# Patient Record
Sex: Male | Born: 1940
Health system: Southern US, Community
[De-identification: ages and names within clinical notes are randomized; demographics above are authoritative.]

## PROBLEM LIST (undated history)

## (undated) DIAGNOSIS — F17209 Nicotine dependence, unspecified, with unspecified nicotine-induced disorders: Secondary | ICD-10-CM

## (undated) DIAGNOSIS — I2510 Atherosclerotic heart disease of native coronary artery without angina pectoris: Secondary | ICD-10-CM

## (undated) DIAGNOSIS — I213 ST elevation (STEMI) myocardial infarction of unspecified site: Secondary | ICD-10-CM

## (undated) DIAGNOSIS — K5790 Diverticulosis of intestine, part unspecified, without perforation or abscess without bleeding: Secondary | ICD-10-CM

## (undated) DIAGNOSIS — R0989 Other specified symptoms and signs involving the circulatory and respiratory systems: Secondary | ICD-10-CM

## (undated) DIAGNOSIS — M1990 Unspecified osteoarthritis, unspecified site: Secondary | ICD-10-CM

## (undated) DIAGNOSIS — N182 Chronic kidney disease, stage 2 (mild): Secondary | ICD-10-CM

## (undated) DIAGNOSIS — I739 Peripheral vascular disease, unspecified: Secondary | ICD-10-CM

## (undated) DIAGNOSIS — E785 Hyperlipidemia, unspecified: Secondary | ICD-10-CM

## (undated) DIAGNOSIS — Z8546 Personal history of malignant neoplasm of prostate: Secondary | ICD-10-CM

## (undated) DIAGNOSIS — I480 Paroxysmal atrial fibrillation: Secondary | ICD-10-CM

## (undated) DIAGNOSIS — J449 Chronic obstructive pulmonary disease, unspecified: Secondary | ICD-10-CM

## (undated) DIAGNOSIS — K439 Ventral hernia without obstruction or gangrene: Secondary | ICD-10-CM

## (undated) DIAGNOSIS — I779 Disorder of arteries and arterioles, unspecified: Secondary | ICD-10-CM

## (undated) DIAGNOSIS — K627 Radiation proctitis: Secondary | ICD-10-CM

## (undated) DIAGNOSIS — G629 Polyneuropathy, unspecified: Secondary | ICD-10-CM

## (undated) DIAGNOSIS — I5042 Chronic combined systolic (congestive) and diastolic (congestive) heart failure: Secondary | ICD-10-CM

## (undated) DIAGNOSIS — I4892 Unspecified atrial flutter: Secondary | ICD-10-CM

## (undated) DIAGNOSIS — Z8673 Personal history of transient ischemic attack (TIA), and cerebral infarction without residual deficits: Secondary | ICD-10-CM

## (undated) HISTORY — DX: Atherosclerotic heart disease of native coronary artery without angina pectoris: I25.10

## (undated) HISTORY — PX: LUMBAR LAMINECTOMY: SHX95

## (undated) HISTORY — DX: Chronic combined systolic (congestive) and diastolic (congestive) heart failure: I50.42

## (undated) HISTORY — DX: Peripheral vascular disease, unspecified: I73.9

## (undated) HISTORY — DX: Other specified symptoms and signs involving the circulatory and respiratory systems: R09.89

## (undated) HISTORY — DX: Paroxysmal atrial fibrillation: I48.0

## (undated) HISTORY — DX: Personal history of malignant neoplasm of prostate: Z85.46

## (undated) HISTORY — DX: Hyperlipidemia, unspecified: E78.5

## (undated) HISTORY — DX: Radiation proctitis: K62.7

## (undated) HISTORY — DX: Chronic obstructive pulmonary disease, unspecified: J44.9

## (undated) HISTORY — DX: Diverticulosis of intestine, part unspecified, without perforation or abscess without bleeding: K57.90

## (undated) HISTORY — DX: Disorder of arteries and arterioles, unspecified: I77.9

## (undated) HISTORY — DX: Nicotine dependence, unspecified, with unspecified nicotine-induced disorders: F17.209

## (undated) HISTORY — PX: APPENDECTOMY: SHX54

## (undated) HISTORY — DX: Chronic kidney disease, stage 2 (mild): N18.2

## (undated) HISTORY — DX: Personal history of transient ischemic attack (TIA), and cerebral infarction without residual deficits: Z86.73

## (undated) HISTORY — DX: Polyneuropathy, unspecified: G62.9

## (undated) HISTORY — DX: Ventral hernia without obstruction or gangrene: K43.9

## (undated) HISTORY — DX: Unspecified osteoarthritis, unspecified site: M19.90

## (undated) HISTORY — DX: Unspecified atrial flutter: I48.92

## (undated) HISTORY — PX: TONSILLECTOMY: SUR1361

---

## 1990-04-09 HISTORY — PX: CORONARY ARTERY BYPASS GRAFT: SHX141

## 1997-07-21 ENCOUNTER — Inpatient Hospital Stay (HOSPITAL_COMMUNITY): Admission: EM | Admit: 1997-07-21 | Discharge: 1997-07-23 | Payer: Self-pay | Admitting: Emergency Medicine

## 1998-01-26 ENCOUNTER — Ambulatory Visit (HOSPITAL_COMMUNITY): Admission: RE | Admit: 1998-01-26 | Discharge: 1998-01-26 | Payer: Self-pay | Admitting: Family Medicine

## 2000-02-21 ENCOUNTER — Ambulatory Visit (HOSPITAL_COMMUNITY): Admission: RE | Admit: 2000-02-21 | Discharge: 2000-02-21 | Payer: Self-pay | Admitting: Orthopedic Surgery

## 2000-02-21 ENCOUNTER — Encounter: Payer: Self-pay | Admitting: Orthopedic Surgery

## 2000-05-27 ENCOUNTER — Encounter: Admission: RE | Admit: 2000-05-27 | Discharge: 2000-08-25 | Payer: Self-pay | Admitting: Orthopedic Surgery

## 2000-09-16 ENCOUNTER — Encounter: Admission: RE | Admit: 2000-09-16 | Discharge: 2000-09-16 | Payer: Self-pay | Admitting: Neurosurgery

## 2000-11-06 ENCOUNTER — Encounter: Payer: Self-pay | Admitting: Family Medicine

## 2000-11-06 ENCOUNTER — Encounter: Admission: RE | Admit: 2000-11-06 | Discharge: 2000-11-06 | Payer: Self-pay | Admitting: Family Medicine

## 2001-10-28 ENCOUNTER — Ambulatory Visit (HOSPITAL_COMMUNITY): Admission: RE | Admit: 2001-10-28 | Discharge: 2001-10-28 | Payer: Self-pay | Admitting: Neurosurgery

## 2001-10-28 ENCOUNTER — Encounter: Payer: Self-pay | Admitting: Neurosurgery

## 2002-10-13 ENCOUNTER — Encounter: Admission: RE | Admit: 2002-10-13 | Discharge: 2002-10-13 | Payer: Self-pay | Admitting: Family Medicine

## 2002-10-13 ENCOUNTER — Encounter: Payer: Self-pay | Admitting: Family Medicine

## 2003-03-23 ENCOUNTER — Ambulatory Visit (HOSPITAL_COMMUNITY): Admission: RE | Admit: 2003-03-23 | Discharge: 2003-03-23 | Payer: Self-pay | Admitting: Ophthalmology

## 2004-02-21 ENCOUNTER — Ambulatory Visit: Payer: Self-pay | Admitting: Internal Medicine

## 2004-04-21 ENCOUNTER — Ambulatory Visit: Payer: Self-pay | Admitting: Internal Medicine

## 2004-07-14 ENCOUNTER — Ambulatory Visit: Payer: Self-pay | Admitting: Internal Medicine

## 2004-07-20 ENCOUNTER — Ambulatory Visit: Payer: Self-pay | Admitting: Internal Medicine

## 2004-08-10 ENCOUNTER — Ambulatory Visit: Payer: Self-pay | Admitting: Internal Medicine

## 2004-08-30 ENCOUNTER — Ambulatory Visit: Payer: Self-pay | Admitting: Internal Medicine

## 2004-10-12 ENCOUNTER — Ambulatory Visit: Payer: Self-pay | Admitting: Internal Medicine

## 2004-10-25 ENCOUNTER — Ambulatory Visit: Payer: Self-pay | Admitting: Internal Medicine

## 2004-11-09 ENCOUNTER — Ambulatory Visit: Payer: Self-pay | Admitting: Cardiology

## 2004-11-21 ENCOUNTER — Ambulatory Visit: Payer: Self-pay

## 2005-01-24 ENCOUNTER — Ambulatory Visit: Payer: Self-pay | Admitting: Internal Medicine

## 2005-04-23 ENCOUNTER — Ambulatory Visit: Payer: Self-pay | Admitting: Internal Medicine

## 2005-07-17 ENCOUNTER — Ambulatory Visit: Payer: Self-pay | Admitting: Internal Medicine

## 2005-07-23 ENCOUNTER — Ambulatory Visit: Payer: Self-pay | Admitting: Internal Medicine

## 2005-10-22 ENCOUNTER — Ambulatory Visit: Payer: Self-pay | Admitting: Internal Medicine

## 2005-11-12 ENCOUNTER — Ambulatory Visit: Payer: Self-pay | Admitting: Cardiology

## 2005-11-21 ENCOUNTER — Ambulatory Visit: Payer: Self-pay

## 2006-01-15 ENCOUNTER — Ambulatory Visit: Payer: Self-pay

## 2006-01-22 ENCOUNTER — Ambulatory Visit: Payer: Self-pay | Admitting: Internal Medicine

## 2006-04-22 ENCOUNTER — Ambulatory Visit: Payer: Self-pay | Admitting: Internal Medicine

## 2006-04-22 LAB — CONVERTED CEMR LAB
Cholesterol: 136 mg/dL (ref 0–200)
Potassium: 4 meq/L (ref 3.5–5.1)
Sodium: 145 meq/L (ref 135–145)
TSH: 1.39 microintl units/mL (ref 0.35–5.50)
Triglyceride fasting, serum: 91 mg/dL (ref 0–149)

## 2006-04-24 ENCOUNTER — Ambulatory Visit: Payer: Self-pay | Admitting: Internal Medicine

## 2006-05-13 ENCOUNTER — Ambulatory Visit: Payer: Self-pay | Admitting: Internal Medicine

## 2006-05-13 LAB — CONVERTED CEMR LAB: PSA: 2.78 ng/mL (ref 0.10–4.00)

## 2006-07-11 ENCOUNTER — Ambulatory Visit: Payer: Self-pay | Admitting: Internal Medicine

## 2006-07-11 LAB — CONVERTED CEMR LAB
Total CHOL/HDL Ratio: 4
Triglycerides: 120 mg/dL (ref 0–149)

## 2006-07-18 ENCOUNTER — Ambulatory Visit: Payer: Self-pay | Admitting: Internal Medicine

## 2006-10-15 ENCOUNTER — Ambulatory Visit: Payer: Self-pay | Admitting: Internal Medicine

## 2006-10-15 LAB — CONVERTED CEMR LAB
Cholesterol: 128 mg/dL (ref 0–200)
LDL Cholesterol: 81 mg/dL (ref 0–99)
VLDL: 8 mg/dL (ref 0–40)
Vit D, 1,25-Dihydroxy: 47 (ref 20–57)

## 2006-10-22 ENCOUNTER — Ambulatory Visit: Payer: Self-pay | Admitting: Internal Medicine

## 2006-11-26 ENCOUNTER — Ambulatory Visit: Payer: Self-pay | Admitting: Internal Medicine

## 2006-11-27 ENCOUNTER — Ambulatory Visit: Payer: Self-pay | Admitting: Cardiology

## 2007-02-05 ENCOUNTER — Ambulatory Visit: Payer: Self-pay | Admitting: Internal Medicine

## 2007-04-09 ENCOUNTER — Telehealth: Payer: Self-pay | Admitting: Internal Medicine

## 2007-04-22 ENCOUNTER — Ambulatory Visit: Payer: Self-pay | Admitting: Internal Medicine

## 2007-04-22 DIAGNOSIS — G5793 Unspecified mononeuropathy of bilateral lower limbs: Secondary | ICD-10-CM

## 2007-04-22 DIAGNOSIS — J441 Chronic obstructive pulmonary disease with (acute) exacerbation: Secondary | ICD-10-CM | POA: Insufficient documentation

## 2007-04-22 DIAGNOSIS — J069 Acute upper respiratory infection, unspecified: Secondary | ICD-10-CM | POA: Insufficient documentation

## 2007-04-22 DIAGNOSIS — E785 Hyperlipidemia, unspecified: Secondary | ICD-10-CM | POA: Insufficient documentation

## 2007-04-22 DIAGNOSIS — M1990 Unspecified osteoarthritis, unspecified site: Secondary | ICD-10-CM

## 2007-04-22 DIAGNOSIS — M545 Low back pain: Secondary | ICD-10-CM | POA: Insufficient documentation

## 2007-04-23 LAB — CONVERTED CEMR LAB
ALT: 12 units/L (ref 0–53)
BUN: 15 mg/dL (ref 6–23)
Calcium: 9.5 mg/dL (ref 8.4–10.5)
Chloride: 103 meq/L (ref 96–112)
Cholesterol: 139 mg/dL (ref 0–200)
Creatinine, Ser: 1 mg/dL (ref 0.4–1.5)
GFR calc Af Amer: 96 mL/min
GFR calc non Af Amer: 79 mL/min
HDL: 35 mg/dL — ABNORMAL LOW (ref >39.0)
Sodium: 141 meq/L (ref 135–145)
Total CHOL/HDL Ratio: 4
Triglycerides: 104 mg/dL (ref 0–149)

## 2007-05-23 ENCOUNTER — Encounter: Payer: Self-pay | Admitting: Internal Medicine

## 2007-06-05 ENCOUNTER — Encounter: Payer: Self-pay | Admitting: Internal Medicine

## 2007-07-15 ENCOUNTER — Encounter: Payer: Self-pay | Admitting: Internal Medicine

## 2007-08-06 ENCOUNTER — Ambulatory Visit: Payer: Self-pay | Admitting: Internal Medicine

## 2007-08-06 DIAGNOSIS — J309 Allergic rhinitis, unspecified: Secondary | ICD-10-CM | POA: Insufficient documentation

## 2007-08-06 DIAGNOSIS — R972 Elevated prostate specific antigen [PSA]: Secondary | ICD-10-CM

## 2007-11-06 ENCOUNTER — Ambulatory Visit: Payer: Self-pay | Admitting: Cardiology

## 2007-11-06 LAB — CONVERTED CEMR LAB
ALT: 16 units/L (ref 0–53)
AST: 17 units/L (ref 0–37)
Alkaline Phosphatase: 60 units/L (ref 39–117)
BUN: 12 mg/dL (ref 6–23)
Basophils Absolute: 0.1 10*3/uL (ref 0.0–0.1)
CO2: 31 meq/L (ref 19–32)
Chloride: 105 meq/L (ref 96–112)
Creatinine, Ser: 0.9 mg/dL (ref 0.4–1.5)
Eosinophils Absolute: 0.4 10*3/uL (ref 0.0–0.7)
Eosinophils Relative: 4.7 % (ref 0.0–5.0)
Glucose, Bld: 107 mg/dL — ABNORMAL HIGH (ref 70–99)
HCT: 42.9 % (ref 39.0–52.0)
Hemoglobin: 15.1 g/dL (ref 13.0–17.0)
MCHC: 35.2 g/dL (ref 30.0–36.0)
Monocytes Absolute: 0.6 10*3/uL (ref 0.1–1.0)
Monocytes Relative: 7.3 % (ref 3.0–12.0)
Sodium: 141 meq/L (ref 135–145)
Total Bilirubin: 0.9 mg/dL (ref 0.3–1.2)
Triglycerides: 85 mg/dL (ref 0–149)
VLDL: 17 mg/dL (ref 0–40)
WBC: 8.3 10*3/uL (ref 4.5–10.5)

## 2007-11-11 ENCOUNTER — Ambulatory Visit: Payer: Self-pay | Admitting: Cardiology

## 2007-12-08 ENCOUNTER — Ambulatory Visit: Payer: Self-pay | Admitting: Internal Medicine

## 2008-01-07 ENCOUNTER — Ambulatory Visit: Payer: Self-pay | Admitting: Internal Medicine

## 2008-02-04 ENCOUNTER — Ambulatory Visit: Payer: Self-pay

## 2008-02-04 ENCOUNTER — Encounter: Payer: Self-pay | Admitting: Internal Medicine

## 2008-02-23 ENCOUNTER — Ambulatory Visit: Payer: Self-pay | Admitting: Internal Medicine

## 2008-03-16 ENCOUNTER — Ambulatory Visit: Payer: Self-pay | Admitting: Internal Medicine

## 2008-04-13 ENCOUNTER — Ambulatory Visit (HOSPITAL_COMMUNITY): Admission: RE | Admit: 2008-04-13 | Discharge: 2008-04-13 | Payer: Self-pay | Admitting: Urology

## 2008-05-10 HISTORY — PX: PROSTATECTOMY: SHX69

## 2008-05-18 ENCOUNTER — Encounter: Payer: Self-pay | Admitting: Internal Medicine

## 2008-05-24 ENCOUNTER — Telehealth: Payer: Self-pay | Admitting: Internal Medicine

## 2008-05-26 ENCOUNTER — Ambulatory Visit: Payer: Self-pay | Admitting: Cardiology

## 2008-05-26 ENCOUNTER — Encounter: Payer: Self-pay | Admitting: Physician Assistant

## 2008-06-01 ENCOUNTER — Ambulatory Visit: Payer: Self-pay

## 2008-06-03 ENCOUNTER — Inpatient Hospital Stay (HOSPITAL_COMMUNITY): Admission: RE | Admit: 2008-06-03 | Discharge: 2008-06-05 | Payer: Self-pay | Admitting: Urology

## 2008-06-03 ENCOUNTER — Encounter (INDEPENDENT_AMBULATORY_CARE_PROVIDER_SITE_OTHER): Payer: Self-pay | Admitting: Urology

## 2008-07-16 ENCOUNTER — Ambulatory Visit: Admission: RE | Admit: 2008-07-16 | Discharge: 2008-07-23 | Payer: Self-pay | Admitting: Radiation Oncology

## 2008-07-19 ENCOUNTER — Encounter: Payer: Self-pay | Admitting: Internal Medicine

## 2008-07-26 ENCOUNTER — Telehealth: Payer: Self-pay | Admitting: Cardiology

## 2008-07-27 ENCOUNTER — Telehealth: Payer: Self-pay | Admitting: Cardiology

## 2008-07-30 ENCOUNTER — Ambulatory Visit: Admission: RE | Admit: 2008-07-30 | Discharge: 2008-10-27 | Payer: Self-pay | Admitting: Radiation Oncology

## 2008-08-02 ENCOUNTER — Ambulatory Visit: Payer: Self-pay | Admitting: Internal Medicine

## 2008-08-02 DIAGNOSIS — Z8546 Personal history of malignant neoplasm of prostate: Secondary | ICD-10-CM | POA: Insufficient documentation

## 2008-08-24 ENCOUNTER — Ambulatory Visit: Payer: Self-pay | Admitting: Cardiology

## 2008-08-26 ENCOUNTER — Ambulatory Visit: Payer: Self-pay | Admitting: Cardiology

## 2008-08-30 LAB — CONVERTED CEMR LAB
ALT: 12 units/L (ref 0–53)
Albumin: 3.9 g/dL (ref 3.5–5.2)
Alkaline Phosphatase: 56 units/L (ref 39–117)
Basophils Relative: 5.5 % — ABNORMAL HIGH (ref 0.0–3.0)
Bilirubin, Direct: 0.2 mg/dL (ref 0.0–0.3)
Calcium: 9.2 mg/dL (ref 8.4–10.5)
Eosinophils Absolute: 0.3 10*3/uL (ref 0.0–0.7)
GFR calc non Af Amer: 88.76 mL/min (ref >60)
Glucose, Bld: 101 mg/dL — ABNORMAL HIGH (ref 70–99)
Lymphs Abs: 1.3 10*3/uL (ref 0.7–4.0)
MCHC: 35.2 g/dL (ref 30.0–36.0)
MCV: 97.2 fL (ref 78.0–100.0)
Monocytes Absolute: 0.4 10*3/uL (ref 0.1–1.0)
Neutro Abs: 4.5 10*3/uL (ref 1.4–7.7)
Potassium: 4.6 meq/L (ref 3.5–5.1)
RDW: 11.8 % (ref 11.5–14.6)
Sodium: 142 meq/L (ref 135–145)
Total Bilirubin: 0.7 mg/dL (ref 0.3–1.2)
Total Protein: 6.2 g/dL (ref 6.0–8.3)
WBC: 6.9 10*3/uL (ref 4.5–10.5)

## 2008-11-26 ENCOUNTER — Ambulatory Visit: Payer: Self-pay | Admitting: Internal Medicine

## 2008-11-26 DIAGNOSIS — J209 Acute bronchitis, unspecified: Secondary | ICD-10-CM

## 2008-12-01 ENCOUNTER — Encounter: Payer: Self-pay | Admitting: Internal Medicine

## 2008-12-01 ENCOUNTER — Ambulatory Visit (HOSPITAL_COMMUNITY): Admission: RE | Admit: 2008-12-01 | Discharge: 2008-12-01 | Payer: Self-pay | Admitting: Radiation Oncology

## 2008-12-01 ENCOUNTER — Ambulatory Visit: Admission: RE | Admit: 2008-12-01 | Discharge: 2008-12-01 | Payer: Self-pay | Admitting: Radiation Oncology

## 2008-12-01 LAB — CBC WITH DIFFERENTIAL/PLATELET
BASO%: 0.6 % (ref 0.0–2.0)
EOS%: 6.1 % (ref 0.0–7.0)
MCH: 34.5 pg — ABNORMAL HIGH (ref 27.2–33.4)
MCHC: 34.5 g/dL (ref 32.0–36.0)
NEUT%: 74 % (ref 39.0–75.0)
RBC: 3.84 10*6/uL — ABNORMAL LOW (ref 4.20–5.82)
RDW: 12.7 % (ref 11.0–14.6)
lymph#: 0.7 10*3/uL — ABNORMAL LOW (ref 0.9–3.3)

## 2008-12-27 ENCOUNTER — Encounter: Payer: Self-pay | Admitting: Cardiology

## 2009-02-01 ENCOUNTER — Ambulatory Visit: Payer: Self-pay | Admitting: Internal Medicine

## 2009-02-09 ENCOUNTER — Encounter: Payer: Self-pay | Admitting: Internal Medicine

## 2009-03-14 ENCOUNTER — Ambulatory Visit: Payer: Self-pay | Admitting: Cardiology

## 2009-03-17 ENCOUNTER — Ambulatory Visit: Payer: Self-pay | Admitting: Internal Medicine

## 2009-03-17 LAB — CONVERTED CEMR LAB
Albumin: 3.8 g/dL (ref 3.5–5.2)
BUN: 12 mg/dL (ref 6–23)
Basophils Absolute: 0.1 10*3/uL (ref 0.0–0.1)
Bilirubin, Direct: 0.1 mg/dL (ref 0.0–0.3)
CO2: 30 meq/L (ref 19–32)
Creatinine, Ser: 1 mg/dL (ref 0.4–1.5)
Eosinophils Relative: 4.9 % (ref 0.0–5.0)
GFR calc non Af Amer: 78.47 mL/min (ref >60)
Glucose, Bld: 109 mg/dL — ABNORMAL HIGH (ref 70–99)
HDL: 35.7 mg/dL — ABNORMAL LOW (ref >39.00)
Hemoglobin: 13.9 g/dL (ref 13.0–17.0)
Lymphs Abs: 0.7 10*3/uL (ref 0.7–4.0)
MCHC: 34.7 g/dL (ref 30.0–36.0)
MCV: 99.8 fL (ref 78.0–100.0)
Neutro Abs: 4.6 10*3/uL (ref 1.4–7.7)
Platelets: 198 10*3/uL (ref 150.0–400.0)
Potassium: 4.6 meq/L (ref 3.5–5.1)
RDW: 11.8 % (ref 11.5–14.6)
TSH: 1.23 microintl units/mL (ref 0.35–5.50)
Total Protein: 6.2 g/dL (ref 6.0–8.3)
WBC: 6.3 10*3/uL (ref 4.5–10.5)

## 2009-03-22 ENCOUNTER — Ambulatory Visit: Payer: Self-pay | Admitting: Internal Medicine

## 2009-03-22 DIAGNOSIS — Z8679 Personal history of other diseases of the circulatory system: Secondary | ICD-10-CM | POA: Insufficient documentation

## 2009-03-23 LAB — CONVERTED CEMR LAB
Bilirubin Urine: NEGATIVE
Hemoglobin, Urine: NEGATIVE
Total Protein, Urine: NEGATIVE mg/dL
Urine Glucose: NEGATIVE mg/dL
pH: 7 (ref 5.0–8.0)

## 2009-03-31 ENCOUNTER — Ambulatory Visit (HOSPITAL_COMMUNITY): Admission: RE | Admit: 2009-03-31 | Discharge: 2009-03-31 | Payer: Self-pay | Admitting: Cardiology

## 2009-03-31 ENCOUNTER — Ambulatory Visit: Payer: Self-pay

## 2009-03-31 ENCOUNTER — Encounter: Payer: Self-pay | Admitting: Cardiology

## 2009-03-31 ENCOUNTER — Encounter: Payer: Self-pay | Admitting: Cardiovascular Disease

## 2009-03-31 ENCOUNTER — Ambulatory Visit: Payer: Self-pay | Admitting: Internal Medicine

## 2009-04-13 ENCOUNTER — Encounter: Payer: Self-pay | Admitting: Cardiology

## 2009-04-13 ENCOUNTER — Encounter: Payer: Self-pay | Admitting: Internal Medicine

## 2009-04-21 ENCOUNTER — Encounter: Payer: Self-pay | Admitting: Cardiology

## 2009-04-21 ENCOUNTER — Encounter: Admission: RE | Admit: 2009-04-21 | Discharge: 2009-04-21 | Payer: Self-pay | Admitting: Neurology

## 2009-04-21 ENCOUNTER — Encounter: Payer: Self-pay | Admitting: Internal Medicine

## 2009-05-12 ENCOUNTER — Encounter: Payer: Self-pay | Admitting: Internal Medicine

## 2009-05-18 ENCOUNTER — Ambulatory Visit: Payer: Self-pay | Admitting: Cardiology

## 2009-05-19 ENCOUNTER — Telehealth: Payer: Self-pay | Admitting: Cardiology

## 2009-05-25 ENCOUNTER — Encounter (INDEPENDENT_AMBULATORY_CARE_PROVIDER_SITE_OTHER): Payer: Self-pay | Admitting: *Deleted

## 2009-06-06 ENCOUNTER — Telehealth (INDEPENDENT_AMBULATORY_CARE_PROVIDER_SITE_OTHER): Payer: Self-pay | Admitting: *Deleted

## 2009-06-27 ENCOUNTER — Ambulatory Visit: Payer: Self-pay | Admitting: Cardiovascular Disease

## 2009-06-27 LAB — CONVERTED CEMR LAB
BUN: 9 mg/dL (ref 6–23)
Chloride: 107 meq/L (ref 96–112)
GFR calc non Af Amer: 88.54 mL/min (ref >60)

## 2009-07-05 ENCOUNTER — Ambulatory Visit: Payer: Self-pay | Admitting: Internal Medicine

## 2009-07-05 LAB — CONVERTED CEMR LAB
ALT: 16 units/L (ref 0–53)
Albumin: 3.8 g/dL (ref 3.5–5.2)
BUN: 11 mg/dL (ref 6–23)
Bilirubin, Direct: 0.1 mg/dL (ref 0.0–0.3)
Calcium: 9.3 mg/dL (ref 8.4–10.5)
Creatinine, Ser: 0.8 mg/dL (ref 0.4–1.5)
Potassium: 4.5 meq/L (ref 3.5–5.1)
Total CHOL/HDL Ratio: 3
Total Protein: 6.5 g/dL (ref 6.0–8.3)
Triglycerides: 93 mg/dL (ref 0.0–149.0)
VLDL: 18.6 mg/dL (ref 0.0–40.0)

## 2009-07-07 ENCOUNTER — Ambulatory Visit: Payer: Self-pay | Admitting: Internal Medicine

## 2009-07-11 ENCOUNTER — Ambulatory Visit: Payer: Self-pay | Admitting: Internal Medicine

## 2009-07-11 DIAGNOSIS — R21 Rash and other nonspecific skin eruption: Secondary | ICD-10-CM

## 2009-07-21 ENCOUNTER — Encounter: Payer: Self-pay | Admitting: Cardiovascular Disease

## 2009-07-21 ENCOUNTER — Ambulatory Visit: Payer: Self-pay | Admitting: Cardiovascular Disease

## 2009-08-15 ENCOUNTER — Ambulatory Visit: Payer: Self-pay | Admitting: Cardiovascular Disease

## 2009-08-16 LAB — CONVERTED CEMR LAB
Calcium: 9.2 mg/dL (ref 8.4–10.5)
GFR calc non Af Amer: 98.55 mL/min (ref >60)
HCT: 37.4 % — ABNORMAL LOW (ref 39.0–52.0)
INR: 1 (ref 0.8–1.0)
MCHC: 35 g/dL (ref 30.0–36.0)
MCV: 97.7 fL (ref 78.0–100.0)
Potassium: 4.9 meq/L (ref 3.5–5.1)
RBC: 3.82 M/uL — ABNORMAL LOW (ref 4.22–5.81)
aPTT: 28.8 s (ref 21.7–28.8)

## 2009-08-17 ENCOUNTER — Ambulatory Visit: Payer: Self-pay | Admitting: Cardiovascular Disease

## 2009-08-17 ENCOUNTER — Inpatient Hospital Stay (HOSPITAL_COMMUNITY): Admission: RE | Admit: 2009-08-17 | Discharge: 2009-08-18 | Payer: Self-pay | Admitting: Cardiovascular Disease

## 2009-08-29 ENCOUNTER — Encounter: Payer: Self-pay | Admitting: Cardiovascular Disease

## 2009-08-30 ENCOUNTER — Ambulatory Visit: Payer: Self-pay

## 2009-08-30 ENCOUNTER — Telehealth: Payer: Self-pay | Admitting: Cardiovascular Disease

## 2009-08-30 ENCOUNTER — Ambulatory Visit: Payer: Self-pay | Admitting: Cardiovascular Disease

## 2009-09-07 ENCOUNTER — Encounter: Payer: Self-pay | Admitting: Internal Medicine

## 2009-09-12 ENCOUNTER — Ambulatory Visit: Payer: Self-pay | Admitting: Cardiovascular Disease

## 2009-09-12 LAB — CONVERTED CEMR LAB
BUN: 15 mg/dL (ref 6–23)
CO2: 30 meq/L (ref 19–32)
Chloride: 102 meq/L (ref 96–112)
Eosinophils Relative: 6.3 % — ABNORMAL HIGH (ref 0.0–5.0)
Lymphocytes Relative: 16 % (ref 12.0–46.0)
MCHC: 35.2 g/dL (ref 30.0–36.0)
MCV: 98.8 fL (ref 78.0–100.0)
Monocytes Absolute: 0.6 10*3/uL (ref 0.1–1.0)
Monocytes Relative: 10.2 % (ref 3.0–12.0)
Potassium: 4.7 meq/L (ref 3.5–5.1)
Prothrombin Time: 10.7 s (ref 9.7–11.8)
RDW: 12.8 % (ref 11.5–14.6)
WBC: 5.7 10*3/uL (ref 4.5–10.5)

## 2009-09-14 ENCOUNTER — Ambulatory Visit: Payer: Self-pay | Admitting: Cardiovascular Disease

## 2009-09-14 ENCOUNTER — Ambulatory Visit (HOSPITAL_COMMUNITY)
Admission: RE | Admit: 2009-09-14 | Discharge: 2009-09-14 | Payer: Self-pay | Source: Home / Self Care | Admitting: Cardiovascular Disease

## 2009-09-21 ENCOUNTER — Encounter: Payer: Self-pay | Admitting: Cardiovascular Disease

## 2009-09-22 ENCOUNTER — Ambulatory Visit: Payer: Self-pay

## 2009-09-22 ENCOUNTER — Encounter: Payer: Self-pay | Admitting: Cardiovascular Disease

## 2009-10-25 ENCOUNTER — Telehealth: Payer: Self-pay | Admitting: Cardiovascular Disease

## 2009-10-28 ENCOUNTER — Telehealth: Payer: Self-pay | Admitting: Internal Medicine

## 2009-10-31 ENCOUNTER — Telehealth: Payer: Self-pay | Admitting: Internal Medicine

## 2009-11-01 ENCOUNTER — Encounter: Payer: Self-pay | Admitting: Internal Medicine

## 2009-11-01 ENCOUNTER — Ambulatory Visit: Payer: Self-pay | Admitting: Gastroenterology

## 2009-11-01 DIAGNOSIS — R1915 Other abnormal bowel sounds: Secondary | ICD-10-CM | POA: Insufficient documentation

## 2009-11-01 DIAGNOSIS — K625 Hemorrhage of anus and rectum: Secondary | ICD-10-CM | POA: Insufficient documentation

## 2009-11-01 DIAGNOSIS — R1032 Left lower quadrant pain: Secondary | ICD-10-CM

## 2009-11-01 DIAGNOSIS — K5730 Diverticulosis of large intestine without perforation or abscess without bleeding: Secondary | ICD-10-CM | POA: Insufficient documentation

## 2009-11-01 DIAGNOSIS — R143 Flatulence: Secondary | ICD-10-CM

## 2009-11-01 DIAGNOSIS — R1084 Generalized abdominal pain: Secondary | ICD-10-CM

## 2009-11-01 DIAGNOSIS — R1031 Right lower quadrant pain: Secondary | ICD-10-CM | POA: Insufficient documentation

## 2009-11-01 DIAGNOSIS — R141 Gas pain: Secondary | ICD-10-CM | POA: Insufficient documentation

## 2009-11-01 DIAGNOSIS — R142 Eructation: Secondary | ICD-10-CM

## 2009-11-02 LAB — CONVERTED CEMR LAB
BUN: 12 mg/dL (ref 6–23)
Basophils Relative: 0.7 % (ref 0.0–3.0)
Calcium: 9.2 mg/dL (ref 8.4–10.5)
Eosinophils Absolute: 0.4 10*3/uL (ref 0.0–0.7)
HCT: 40.5 % (ref 39.0–52.0)
Hemoglobin: 13.9 g/dL (ref 13.0–17.0)
Lymphs Abs: 1 10*3/uL (ref 0.7–4.0)
Neutrophils Relative %: 72.1 % (ref 43.0–77.0)
Platelets: 238 10*3/uL (ref 150.0–400.0)
Potassium: 4.5 meq/L (ref 3.5–5.1)
RDW: 12.8 % (ref 11.5–14.6)
WBC: 7 10*3/uL (ref 4.5–10.5)

## 2009-11-04 ENCOUNTER — Ambulatory Visit: Payer: Self-pay | Admitting: Cardiovascular Disease

## 2009-11-14 ENCOUNTER — Ambulatory Visit: Payer: Self-pay | Admitting: Internal Medicine

## 2009-11-22 ENCOUNTER — Ambulatory Visit: Payer: Self-pay | Admitting: Internal Medicine

## 2009-11-24 ENCOUNTER — Encounter: Payer: Self-pay | Admitting: Internal Medicine

## 2010-01-25 ENCOUNTER — Ambulatory Visit: Payer: Self-pay | Admitting: Internal Medicine

## 2010-02-16 ENCOUNTER — Ambulatory Visit: Payer: Self-pay | Admitting: Internal Medicine

## 2010-02-17 LAB — CONVERTED CEMR LAB
Albumin: 3.9 g/dL (ref 3.5–5.2)
Alkaline Phosphatase: 69 units/L (ref 39–117)
Basophils Relative: 0.5 % (ref 0.0–3.0)
Bilirubin Urine: NEGATIVE
Calcium: 9.9 mg/dL (ref 8.4–10.5)
Chloride: 107 meq/L (ref 96–112)
Eosinophils Relative: 5.5 % — ABNORMAL HIGH (ref 0.0–5.0)
Glucose, Bld: 108 mg/dL — ABNORMAL HIGH (ref 70–99)
HDL: 39.2 mg/dL (ref >39.00)
Hemoglobin: 14.6 g/dL (ref 13.0–17.0)
Lymphs Abs: 1 10*3/uL (ref 0.7–4.0)
MCHC: 34.4 g/dL (ref 30.0–36.0)
Monocytes Absolute: 0.7 10*3/uL (ref 0.1–1.0)
Neutro Abs: 5.5 10*3/uL (ref 1.4–7.7)
Nitrite: NEGATIVE
Platelets: 226 10*3/uL (ref 150.0–400.0)
Potassium: 6 meq/L — ABNORMAL HIGH (ref 3.5–5.1)
RDW: 13.3 % (ref 11.5–14.6)
Sodium: 145 meq/L (ref 135–145)
Specific Gravity, Urine: 1.01 (ref 1.000–1.030)
Total CHOL/HDL Ratio: 4
Total Protein, Urine: NEGATIVE mg/dL
Triglycerides: 126 mg/dL (ref 0.0–149.0)
VLDL: 25.2 mg/dL (ref 0.0–40.0)
WBC: 7.7 10*3/uL (ref 4.5–10.5)

## 2010-02-20 ENCOUNTER — Ambulatory Visit: Payer: Self-pay | Admitting: Internal Medicine

## 2010-02-20 DIAGNOSIS — H6120 Impacted cerumen, unspecified ear: Secondary | ICD-10-CM

## 2010-03-21 ENCOUNTER — Encounter: Payer: Self-pay | Admitting: Cardiovascular Disease

## 2010-03-22 ENCOUNTER — Ambulatory Visit: Payer: Self-pay | Admitting: Cardiovascular Disease

## 2010-03-22 ENCOUNTER — Encounter: Payer: Self-pay | Admitting: Cardiovascular Disease

## 2010-04-27 DIAGNOSIS — E785 Hyperlipidemia, unspecified: Secondary | ICD-10-CM | POA: Insufficient documentation

## 2010-04-27 DIAGNOSIS — Z8673 Personal history of transient ischemic attack (TIA), and cerebral infarction without residual deficits: Secondary | ICD-10-CM | POA: Insufficient documentation

## 2010-05-09 NOTE — Letter (Signed)
Summary: Peripheral Vascular  [facility], Main Office  [address]   [place], [place] [address]   Phone: [phone]  Fax: [phone]     08/30/2009 MRN: [id number]  ZARIAN COLPITTS [address] [place], [place]  [address]  Dear Mr. Treaster,   You are scheduled for Peripheral Vascular Angiogram on   September 14, 2009            with Dr. Clifton James.            Please arrive at the [facility] at 8:30      a.m. on the day of your procedure.  1. DIET     __X__ Nothing to eat or drink after midnight except your medications with a sip of water.  2. Come to the [place] office on September 12, 2009           for lab work.  The lab at [facility] is open from 8:30 a.m. to 1:30 p.m. and 2:30 p.m. to 5:00 p.m.  The lab at [facility] is open from 7:30 a.m. to 5:30 p.m.  You do not have to be fasting.  3. MAKE SURE YOU TAKE YOUR ASPIRIN.  4. _____ DO NOT TAKE these medications before your procedure:         ________________________________________________________________________________      __X__ YOU MAY TAKE ALL of your  medications with a small amount of water.      ____ START NEW medications:     ________________________________________________________________________________      ____ Eilene Ghazi instructions:     ________________________________________________________________________________  5. Plan for one night stay - bring personal belongings (i.e. toothpaste, toothbrush, etc.)  6. Bring a current list of your medications and current insurance cards.  7. Must have a responsible person to drive you home.   8. Someone must be with yu for the first 24 hours after you arrive home.  9. Please wear clothes that are easy to get on and off and wear slip-on shoes.  *Special note: Every effort is made to have your procedure done on time.  Occasionally there are emergencies that present themselves at the  hospital that may cause delays.  Please be patient if a delay does occur.  If you have any questions after you get home, please call the office at the number listed above.   Dossie Arbour, RN, BSN

## 2010-05-09 NOTE — Letter (Signed)
Summary: [facility]  [facility]   Imported By: Sherian Rein 05/25/2009 10:10:11  _____________________________________________________________________  External Attachment:    Type:   Image     Comment:   External Document

## 2010-05-09 NOTE — Assessment & Plan Note (Signed)
Summary: 4 MO ROV /NWS  #   Vital Signs:  Patient profile:   70 year old male Height:      71 inches (180.34 cm) Weight:      166.25 pounds (75.57 kg) O2 Sat:      97 % on Room air Temp:     97.0 degrees F (36.11 degrees C) oral Pulse rate:   53 / minute BP sitting:   110 / 60  (left arm) Cuff size:   regular  Vitals Entered By: Lamar Sprinkles, CMA (July 11, 2009 9:12 AM)/ Sydell Axon, SMA  O2 Flow:  Room air CC: 4 month f/u//[place]   Primary Care Provider:  Tresa Garter MD  CC:  4 month f/u//[place].  History of Present Illness: The patient presents for a follow up of hypertension, CAD, hyperlipidemia. C/o allergies. C/o rash, itching x 6 months   Current Medications (verified): 1)  Isosorbide Mononitrate Cr 30 Mg Tb24 (Isosorbide Mononitrate) .Marland Kitchen.. 1 Tab Once Daily 2)  Vytorin 10-20 Mg Tabs (Ezetimibe-Simvastatin) .... One By Mouth Every Other Day 3)  Loratadine 10 Mg  Tabs (Loratadine) .... Once Daily As Needed Allergies 4)  Aspirin 81 Mg  Tbec (Aspirin) .Marland Kitchen.. 1 Tab Once Daily 5)  Lorazepam 0.5 Mg Tabs (Lorazepam) .... One By Mouth At Bedtime 6)  Metoprolol Succinate 50 Mg Xr24h-Tab (Metoprolol Succinate) .... 1/2 Tablet By Mouth Daily 7)  Norvasc 5 Mg Tabs (Amlodipine Besylate) .... One By Mouth Daily 8)  Omeprazole 20 Mg Cpdr (Omeprazole) .... One By Mouth Daily 9)  Multivitamins  Tabs (Multiple Vitamin) .... Once Daily 10)  Vitamin D3 1000 Unit Caps (Cholecalciferol) .Marland Kitchen.. 1 By Mouth Daily 11)  Tramadol Hcl 50 Mg Tabs (Tramadol Hcl) .Marland Kitchen.. 1 By Mouth As Needed  Allergies (verified): 1)  ! Codeine Sulfate (Codeine Sulfate)  Past History:  Social History: Last updated: 06/27/2009 Retired-Self employed. Married, 3 living children Current Smoker 1/2 pack per day. Former 1ppd for 40 years. No alcohol use No illicit drug use  Past Medical History: Current Problems:  CORONARY ARTERY DISEASE (ICD-414.00)- S/P LIMA-OM, SVG-PDA 1993 CATH 1999 LIMA ATROPHIC, SVG  OK TRANSIENT ISCHEMIC ATTACK, HX OF (ICD-V12.50) HYPERLIPIDEMIA (ICD-272.4) PERIPHERAL NEUROPATHY (ICD-356.9) PVD WITH CLAUDICATION (ICD-443.89)- ANGIOPLASTY RIGHT ILIAC 8/92, bilateral carotid bruits. PREOPERATIVE EXAMINATION (ICD-V72.84) BRONCHITIS, ACUTE (ICD-466.0) VENTRAL HERNIA (ICD-553.20) PROSTATE CANCER, HX OF (ICD-V10.46) s/p prostatectomy and XRT ELEVATED PROSTATE SPECIFIC ANTIGEN (ICD-790.93) ALLERGIC RHINITIS (ICD-477.9) UPPER RESPIRATORY INFECTION (URI) (ICD-465.9) OSTEOARTHRITIS (ICD-715.90) LOW BACK PAIN (ICD-724.2) COPD (ICD-496) TOBACCO USE DISORDER/SMOKER-SMOKING CESSATION DISCUSSED (ICD-305.1) Allergic rhinitis  Review of Systems  The patient denies fever, hoarseness, dyspnea on exertion, and prolonged cough.         Runny nose, itchy rash  Physical Exam  General:  Well developed, well nourished, in no acute distress. Smells of cigarette smoke Nose:  no deformity, discharge, inflammation, or lesions Mouth:  Teeth, gums and palate normal. Oral mucosa normal. Neck:  Neck supple, no JVD. No masses, thyromegaly or abnormal cervical nodes.Loud bilateral carotid bruits. Lungs:  decreased BS  bilateral  Heart:  Grade  /6 DM loudest at primary aortic area and S4.   Abdomen:  Bowel sounds positive; abdomen soft and non-tender without masses, organomegaly, 7 cm L ventral NT hernia noted. No hepatosplenomegaly. Markings for XRT Msk:  LS withdecreased ROM.  Uses cane Neurologic:  Alert and oriented x 3. No pronator drift. CN 2-12 nonfocal Skin:  dry patches of rash  on trunk Psych:  Oriented X3.  Impression & Recommendations:  Problem # 1:  SKIN RASH (ICD-782.1) likely a dry skin eczema Assessment New Triamc/Eucerin two times a day 1:5 See "Patient Instructions".   Problem # 2:  CORONARY ARTERY DISEASE (ICD-414.00) Assessment: Unchanged  His updated medication list for this problem includes:    Isosorbide Mononitrate Cr 30 Mg Tb24 (Isosorbide mononitrate)  .Marland Kitchen... 1 tab once daily    Aspirin 81 Mg Tbec (Aspirin) .Marland Kitchen... 1 tab once daily    Metoprolol Succinate 50 Mg Xr24h-tab (Metoprolol succinate) .Marland Kitchen... 1/2 tablet by mouth daily    Norvasc 5 Mg Tabs (Amlodipine besylate) ..... One by mouth daily  Problem # 3:  ALLERGIC RHINITIS (ICD-477.9) Assessment: Deteriorated  His updated medication list for this problem includes:    Loratadine 10 Mg Tabs (Loratadine) ..... Once daily as needed allergies See "Patient Instructions".   Problem # 4:  PVD WITH CLAUDICATION (ICD-443.89) Assessment: Unchanged CT 3/31 reviewed  Complete Medication List: 1)  Isosorbide Mononitrate Cr 30 Mg Tb24 (Isosorbide mononitrate) .Marland Kitchen.. 1 tab once daily 2)  Vytorin 10-20 Mg Tabs (Ezetimibe-simvastatin) .... One by mouth every other day 3)  Loratadine 10 Mg Tabs (Loratadine) .... Once daily as needed allergies 4)  Aspirin 81 Mg Tbec (Aspirin) .Marland Kitchen.. 1 tab once daily 5)  Lorazepam 0.5 Mg Tabs (Lorazepam) .... One by mouth at bedtime 6)  Metoprolol Succinate 50 Mg Xr24h-tab (Metoprolol succinate) .... 1/2 tablet by mouth daily 7)  Norvasc 5 Mg Tabs (Amlodipine besylate) .... One by mouth daily 8)  Omeprazole 20 Mg Cpdr (Omeprazole) .... One by mouth daily 9)  Multivitamins Tabs (Multiple vitamin) .... Once daily 10)  Vitamin D3 1000 Unit Caps (Cholecalciferol) .Marland Kitchen.. 1 by mouth daily 11)  Tramadol Hcl 50 Mg Tabs (Tramadol hcl) .Marland Kitchen.. 1 by mouth as needed 12)  Triamcinolone 0.5% Cream in Eucerin Lotion 1:5  .... Use bid  Patient Instructions: 1)  Please schedule a follow-up appointment in 4 months. 2)  Shower with less soap and with cooler water 3)  Use the Sinus rinse as needed  Prescriptions: TRIAMCINOLONE 0.5% CREAM IN EUCERIN LOTION 1:5 use bid  #500 g x 6   Entered and Authorized by:   Tresa Garter MD   Signed by:   Tresa Garter MD on 07/11/2009   Method used:   Print then Give to Patient   RxID:   [id number]

## 2010-05-09 NOTE — Progress Notes (Signed)
Summary: personal call  Phone Note Call from Patient Call back at Home Phone [phone]   Caller: Patient Reason for Call: Talk to Nurse Summary of Call: per pt called. personal called will tell pat when she return his call. Initial call taken by: Lorne Skeens,  October 25, 2009 9:50 AM  Follow-up for Phone Call        left message to call back. Dossie Arbour, RN, BSN  October 25, 2009 10:54 AM  pt calling back saying he's upset he didn't get a call back yesterday, I told him pat called him yesterday at 1054am, he said he was home all day, calling re extreme bruising, blood in stool, bleeds heavily when cut wants to know if he needs to continue plavax and asprin-pls call [phone] Glynda Jaeger  October 26, 2009 11:24 AM Found message on my desktop when I returned to office today. I called pt who reports dark stools with streaks of blood in it. Has been going on for about a month. States he is not regular and sometimes goes 2-3 days without a bowel movement and then will go 2-3 times that day. Stool is soft but not loose- has not been straining. Also states he has bruising on arms and skin tears easily. Feels tired and listless.  Is concerned about taking Plavix and ASA 325 mg. Will discuss with Dr. Clifton James. I told pt I had left message to call back on July 19 and he states he did not get message but "he knows what happened." Follow-up by: Dossie Arbour, RN, BSN,  October 28, 2009 9:59 AM  Additional Follow-up for Phone Call Additional follow up Details #1::        Pt can stop Plavix. He is now one month out from his PV procedures and peripheral stents. Continue ASA.   per pt calling back with cell # info for pat [phone] Lorne Skeens  October 28, 2009 10:11 AM  Additional Follow-up by: Verne Carrow, MD,  October 28, 2009 10:07 AM    Additional Follow-up for Phone Call Additional follow up Details #2::    Dr. Clifton James spoke with pt and gave him above information regarding Plavix  and ASA. Pt was also told he should call primary MD to schedule appt to have blood in stool evaluated. Dr. Clifton James would like to see pt in December with ABI's the same day and pt given this information Follow-up by: Dossie Arbour, RN, BSN,  October 28, 2009 11:35 AM   Appended Document: personal call    Clinical Lists Changes  Medications: Removed medication of PLAVIX 75 MG TABS (CLOPIDOGREL BISULFATE) Take one tablet by mouth daily

## 2010-05-09 NOTE — Assessment & Plan Note (Signed)
Summary: per check out/sf   Visit Type:  Follow-up Referring Provider:  Crecencio Mc Primary Provider:  Tresa Garter MD  CC:  Bilateral leg pain- Feet cramps.  History of Present Illness: 70 yo WM with history of CAD s/p CABG, PVD, hyperlipidemia who is here today for  PV follow up.  He told me at the initial visit that he can walk 1/4 of  mile before his legs begin to ache. His calf muscles get "heavy" and "hard" and then the thighs also begin to ache. This resolves mostly with rest. He also has a constant aching in both legs. He has chronic back problems and toe drop. He has had pain in both legs for years. He has seen no recent change. He has had a prior angioplasty of the right iliac artery in 1992 per records. No ulcerations of the lower extremities. He continues to smoke 1/2 ppd.  He has known mild bilateral carotid artery stenoses.   At the last visit I arranged a CT angiogram which showed disease in the ostium of the right common iliac and serial lesions in the right SFA and  a total occlusion of the left SFA.  His symptoms are unchanged. He wishes to have an attempt at PTA.   Current Medications (verified): 1)  Isosorbide Mononitrate Cr 30 Mg Tb24 (Isosorbide Mononitrate) .Marland Kitchen.. 1 Tab Once Daily 2)  Vytorin 10-20 Mg Tabs (Ezetimibe-Simvastatin) .... One By Mouth Every Other Day 3)  Loratadine 10 Mg  Tabs (Loratadine) .... Once Daily As Needed Allergies 4)  Aspirin 81 Mg  Tbec (Aspirin) .Marland Kitchen.. 1 Tab Once Daily 5)  Lorazepam 0.5 Mg Tabs (Lorazepam) .... One By Mouth At Bedtime 6)  Metoprolol Succinate 50 Mg Xr24h-Tab (Metoprolol Succinate) .... 1/2 Tablet By Mouth Daily 7)  Norvasc 5 Mg Tabs (Amlodipine Besylate) .... One By Mouth Daily 8)  Omeprazole 20 Mg Cpdr (Omeprazole) .... One By Mouth Daily 9)  Multivitamins  Tabs (Multiple Vitamin) .... Once Daily 10)  Vitamin D3 1000 Unit Caps (Cholecalciferol) .Marland Kitchen.. 1 By Mouth Daily 11)  Tramadol Hcl 50 Mg Tabs (Tramadol Hcl) .Marland Kitchen.. 1 By  Mouth As Needed 12)  Triamcinolone 0.5% Cream in Eucerin Lotion 1:5 .... Use Bid 13)  Medi-Cortisone 1 % Crea (Hydrocortisone Acetate) .... As Needed For Rash  Allergies: 1)  ! Codeine Sulfate (Codeine Sulfate)  Past History:  Past Medical History: Reviewed history from 07/11/2009 and no changes required. Current Problems:  CORONARY ARTERY DISEASE (ICD-414.00)- S/P LIMA-OM, SVG-PDA 1993 CATH 1999 LIMA ATROPHIC, SVG OK TRANSIENT ISCHEMIC ATTACK, HX OF (ICD-V12.50) HYPERLIPIDEMIA (ICD-272.4) PERIPHERAL NEUROPATHY (ICD-356.9) PVD WITH CLAUDICATION (ICD-443.89)- ANGIOPLASTY RIGHT ILIAC 8/92, bilateral carotid bruits. PREOPERATIVE EXAMINATION (ICD-V72.84) BRONCHITIS, ACUTE (ICD-466.0) VENTRAL HERNIA (ICD-553.20) PROSTATE CANCER, HX OF (ICD-V10.46) s/p prostatectomy and XRT ELEVATED PROSTATE SPECIFIC ANTIGEN (ICD-790.93) ALLERGIC RHINITIS (ICD-477.9) UPPER RESPIRATORY INFECTION (URI) (ICD-465.9) OSTEOARTHRITIS (ICD-715.90) LOW BACK PAIN (ICD-724.2) COPD (ICD-496) TOBACCO USE DISORDER/SMOKER-SMOKING CESSATION DISCUSSED (ICD-305.1) Allergic rhinitis  Past Surgical History: Reviewed history from 06/27/2009 and no changes required. Coronary artery bypass graft 1992 Lumbar laminectomy x2 (4098,1191) Prostatectomy Dr Laverle Patter 05/2008 and XRT Appendectomy Tonsillectomy  Family History: Reviewed history from 06/27/2009 and no changes required. Family History Hypertension Mother-alive, cancer Father-deceased, CAD/MI Sister deceased cancer Brother alive, heart disease  Social History: Reviewed history from 06/27/2009 and no changes required. Retired-Self employed. Married, 3 living children Current Smoker 1/2 pack per day. Former 1ppd for 40 years. No alcohol use No illicit drug use  Review of Systems  The patient  denies fatigue, malaise, fever, weight gain/loss, vision loss, decreased hearing, hoarseness, chest pain, palpitations, shortness of breath, prolonged cough, wheezing,  sleep apnea, coughing up blood, abdominal pain, blood in stool, nausea, vomiting, diarrhea, heartburn, incontinence, blood in urine, muscle weakness, joint pain, leg swelling, rash, skin lesions, headache, fainting, dizziness, depression, anxiety, enlarged lymph nodes, easy bruising or bleeding, and environmental allergies.         See HPI. Leg pain.  Vital Signs:  Patient profile:   70 year old male Height:      71 inches Weight:      165.50 pounds BMI:     23.17 Pulse rate:   60 / minute Pulse rhythm:   regular Resp:     18 per minute BP sitting:   114 / 54  (left arm) Cuff size:   large  Vitals Entered By: Vikki Ports (July 21, 2009 10:41 AM)  Serial Vital Signs/Assessments:  Time      Position  BP       Pulse  Resp  Temp     By           R Arm     112/50                         Vikki Ports   Physical Exam  General:  General: Well developed, well nourished, NAD HEENT: OP clear, mucus membranes moist SKIN: warm, dry Neuro: No focal deficits Musculoskeletal: Muscle strength 5/5 all ext Psychiatric: Mood and affect normal Neck: No JVD, no carotid bruits, no thyromegaly, no lymphadenopathy. Lungs:Clear bilaterally, no wheezes, rhonci, crackles CV: RRR no murmurs, gallops rubs Abdomen: soft, NT, ND, BS present Extremities: No edema, pedal pulses non-palpable.      CT Scan  Procedure date:  07/07/2009  Findings:      1.  50 - 60% stenosis at the origin of the right common iliac artery.  Significant tandem disease of the proximal right SFA with roughly 70 - 75% stenosis just beyond the origin and tandem focal 80 - 90% stenosis without occlusion. 2.  Mild disease of left iliac arteries.  Segmental occlusion of the distal left SFA measuring 2 cm in length.    Impression & Recommendations:  Problem # 1:  PVD WITH CLAUDICATION (ICD-443.89) His CT angiogram demonstrates severe disease in the right common iliac, the right SFA and the left SFA. He wishes to proceed  with an angiogram with possible PTA. I will arrange this on May 11th at [facility] in the [facility] lab. We will check a CXR today. Will get BMET, CBC and coags the week of the procedure. All risks of procedure and potential complications reviewed with the patient.   Problem # 2:  TOBACCO USE DISORDER/SMOKER-SMOKING CESSATION DISCUSSED (ICD-305.1) Tobacco cessation encouraged.   Other Orders: T-2 View CXR ([id number]) PV Procedure (PV Procedure)  Patient Instructions: 1)  Your physician recommends that you schedule a follow-up appointment in: 6 weeks 2)  Your physician recommends that you return for lab work on Aug 15, 2009--BMP,CBC, PT, PTT  443.9: 3)  Your physician recommends that you continue on your current medications as directed. Please refer to the Current Medication list given to you today. 4)  Your physician has requested that you have a peripheral vascular angiogram. This exam is performed at the hospital. During this exam IV contrast is used to look at arterial blood flow.  Please review the information sheet given for details.

## 2010-05-09 NOTE — Assessment & Plan Note (Signed)
Summary: blood in stool/change in bowel habits/sheri   History of Present Illness Visit Type: Initial Visit Primary GI MD: Lina Sar MD Primary Provider: Tresa Garter MD Requesting Provider: Tresa Garter MD Chief Complaint: blood in stools and change of bowel habitsfor several months. History of Present Illness:   PLEASANY 70 Y.O MALE  KNOWN TO DR. Juanda Chance FROM PREVIOUS COLONOSCOPY 5/06 WHICH WAS DONE FOR SCREENING. HE HAD LEFT COLON DIVERTICULOSIS.    HE HAS SINCE HAD PROSTATE CANCER-UNDERWENT PROSTATECTOMY2/10,HAD ONE LYMPH NODE POSITIVE, AND INVOLVEMENT OF SEMINAL VESICLE ON RIGHT.  HE HAD A COURSE OF RADIATION TO THE LOWER ABDOMEN-COMPLETE JULY 2010.   HE HAS ALSO HAD A RECENT PTA /STENT TO THE LEFT FEMORAL ARTERY 5/11,THEN PTA TO RIGHT FEMORAL ARTERY 6/11. HE WAS ON PLAVIX AND ASPIRIN FOR A COUPLE MONTHS. HE DEVELOPED BRUISING ALL OVER ,AND DARK BLOOD IN HIS STOOL-PLAVIX DC'D LAST WEEK.  HE SAYS HE HAS HAD SOME LLOWER ABDOMINAL DISCOMFORT,POST PRANDIAL BLOATING,INCREASED RUMBLING,GURGLING IN HIS LOWER ABDOMEN OVER THE PAST SEVERAL MONTHS. HE IS HAVING STRINGY,LOOSE STOOLS WHICH ARE DARK,DENIES OBVIOUS BLOOD.SOME DAYS NO BM OR CONSTIPATED THEN BACK TO LOOSE. NO RECTAL PAIN OR PRESSURE OR HX OF HEMORRHOIDS.  HE WAS GIVEN A DX OF MUCOUS COLITIS 1960'S BUT NEVER HAD ANY RECURRENCE.   GI Review of Systems    Reports abdominal pain and  bloating.     Location of  Abdominal pain: lower abdomen.    Denies acid reflux, belching, chest pain, dysphagia with liquids, dysphagia with solids, heartburn, loss of appetite, nausea, vomiting, vomiting blood, and  weight loss.      Reports black tarry stools, change in bowel habits, diverticulosis, and  rectal bleeding.     Denies anal fissure, fecal incontinence, heme positive stool, hemorrhoids, irritable bowel syndrome, jaundice, light color stool, liver problems, and  rectal pain.    Current Medications (verified): 1)  Isosorbide  Mononitrate Cr 30 Mg Tb24 (Isosorbide Mononitrate) .Marland Kitchen.. 1 Tab Once Daily 2)  Vytorin 10-20 Mg Tabs (Ezetimibe-Simvastatin) .... One By Mouth Every Other Day 3)  Loratadine 10 Mg  Tabs (Loratadine) .... Once Daily As Needed Allergies 4)  Lorazepam 0.5 Mg Tabs (Lorazepam) .... One By Mouth At Bedtime 5)  Metoprolol Succinate 50 Mg Xr24h-Tab (Metoprolol Succinate) .... 1/2 Tablet By Mouth Daily 6)  Norvasc 5 Mg Tabs (Amlodipine Besylate) .... One By Mouth Daily 7)  Omeprazole 20 Mg Cpdr (Omeprazole) .... One By Mouth Daily 8)  Multivitamins  Tabs (Multiple Vitamin) .... Once Daily 9)  Vitamin D3 1000 Unit Caps (Cholecalciferol) .Marland Kitchen.. 1 By Mouth Daily 10)  Tramadol Hcl 50 Mg Tabs (Tramadol Hcl) .Marland Kitchen.. 1 By Mouth As Needed 11)  Triamcinolone 0.5% Cream in Eucerin Lotion 1:5 .... Use Bid 12)  Medi-Cortisone 1 % Crea (Hydrocortisone Acetate) .... As Needed For Rash 13)  Aspirin Ec 325 Mg Tbec (Aspirin) .... Take One Tablet By Mouth Daily  Allergies (verified): 1)  ! Codeine Sulfate (Codeine Sulfate)  Past History:  Past Medical History: Current Problems:  CORONARY ARTERY DISEASE (ICD-414.00)- S/P LIMA-OM, SVG-PDA 1993 CATH 1999 LIMA ATROPHIC, SVG OK TRANSIENT ISCHEMIC ATTACK, HX OF (ICD-V12.50) HYPERLIPIDEMIA (ICD-272.4) PERIPHERAL NEUROPATHY (ICD-356.9) PVD WITH CLAUDICATION (ICD-443.89)- ANGIOPLASTY RIGHT ILIAC 8/92 PTA stent left SFA 5/11.  S/P PTA/STENT LEFT FEMORAL ARTERY 5/11,PTA TO RIGHT FEMORAL ARTERY 6/11 Bilateral carotid bruits. PREOPERATIVE EXAMINATION (ICD-V72.84) BRONCHITIS, ACUTE (ICD-466.0) VENTRAL HERNIA (ICD-553.20) PROSTATE CANCER, HX OF (ICD-V10.46) s/p prostatectomy and XRT 2010 DIVERTICULOSIS  ALLERGIC RHINITIS (ICD-477.9)  OSTEOARTHRITIS (ICD-715.90) LOW  BACK PAIN (ICD-724.2) COPD (ICD-496) TOBACCO USE DISORDER/SMOKER-SMOKING CESSATION DISCUSSED (ICD-305.1)  Past Surgical History: Reviewed history from 06/27/2009 and no changes required. Coronary artery bypass  graft 1992 Lumbar laminectomy x2 (1610,9604) Prostatectomy Dr Laverle Patter 05/2008 and XRT Appendectomy Tonsillectomy  Family History: Reviewed history from 06/27/2009 and no changes required. Family History Hypertension Mother-alive, cancer Colon Cancer  Father-deceased, CAD/MI Sister deceased cancer Brother alive, heart disease  Social History: Reviewed history from 06/27/2009 and no changes required. Retired-Self employed. Married, 3 living children Current Smoker 1/2 pack per day. Former 1ppd for 40 years. No alcohol use No illicit drug use  Review of Systems       The patient complains of fatigue and night sweats.  The patient denies allergy/sinus, anemia, anxiety-new, arthritis/joint pain, back pain, blood in urine, breast changes/lumps, change in vision, confusion, cough, coughing up blood, depression-new, fainting, fever, headaches-new, hearing problems, heart murmur, heart rhythm changes, itching, menstrual pain, muscle pains/cramps, nosebleeds, pregnancy symptoms, shortness of breath, skin rash, sleeping problems, sore throat, swelling of feet/legs, swollen lymph glands, thirst - excessive , urination - excessive , urination changes/pain, urine leakage, vision changes, and voice change.         OTHERWISE SEE HPI  Vital Signs:  Patient profile:   70 year old male Height:      71 inches Weight:      165.13 pounds BMI:     23.11 Pulse rate:   80 / minute Pulse rhythm:   regular BP sitting:   108 / 58  (left arm)  Vitals Entered By: Milford Cage NCMA (November 01, 2009 3:29 PM)  Physical Exam  General:  Well developed, well nourished, no acute distress. Head:  Normocephalic and atraumatic. Eyes:  PERRLA, no icterus. Lungs:  Clear throughout to auscultation. Heart:  Regular rate and rhythm; no murmurs, rubs,  or bruits. Abdomen:  SOFT,,TENDER ACROSS LOWER ABDOMEN,NO GUARDING, NO MASS OR HSM,BS HYPERACTIVE. Rectal:  NO EXTERNAL LESION,NO INTERNAL LESION FELT,SCANT DARK  STOOL-HEME NEGATIVE Neurologic:  Alert and  oriented x4;  grossly normal neurologically. Psych:  Alert and cooperative. Normal mood and affect.   Impression & Recommendations:  Problem # 1:  RECTAL BLEEDING (ICD-569.3) Assessment New 70 YO MALE WITH INTERMITTENT DARK RECTAL BLEEDING,LOOSE STOOLS AND LOWER ABDOMINAL PAIN,BLOATING. HE IS S/P RADIACL PROSTATECTOMY 2/10,FOLLOWED BY LOWER ABDOMINAL RADIATION.   RECENT PLAVIX USE-NOW STOPPED.   CONCERNED THAT HE HAS A RADIATION INDUCED ENTERITIS/COLITIS.  LABS AS BELOW SCHEDULE FOR CT SCAN ABD/PELVIS  TO ASSESS SMALL BOWEL INVOLVEMENT/LOW GRADE OBSTRUCTION  ETC. SCHEDULE FOR COLONOSCOPY WITH DR. Hermelinda Medicus DISCUSSED IN DETAIL WITH PT  WHO IS  AGREEABLE. START BENTYL 10 MG 2-3 X DAILY AS NEEDED FOR PAIN,BLOATING START ALIGN ONE DAILY FURTHER PLANS PENDING FINDINGS OF CT AND COLONOSCOPY. Orders: TLB-CBC Platelet - w/Differential (85025-CBCD) TLB-BMP (Basic Metabolic Panel-BMET) (80048-METABOL) Colonoscopy (Colon)  Problem # 2:  PERSONAL HX PROSTATE CANCER (ICD-V10.46) Assessment: Comment Only  Problem # 3:  DIVERTICULOSIS-COLON (ICD-562.10) Assessment: Comment Only  Problem # 4:  CORONARY ARTERY DISEASE (ICD-414.00) Assessment: Comment Only  Problem # 5:  TRANSIENT ISCHEMIC ATTACK, HX OF (ICD-V12.50) Assessment: Comment Only  Problem # 6:  COPD (ICD-496) Assessment: Comment Only  Problem # 7:  PVD WITH CLAUDICATION (ICD-443.89) Assessment: Comment Only  Other Orders: CT Abdomen/Pelvis with Contrast (CT Abd/Pelvis w/con)  Patient Instructions: 1)  Your physician has requested that you have the following labwork done today: 2)  We have given you samples of Align Probiotic. 3)  We scheduled the CT  scan  for Fri 11-04-09 at 9:00 Am.  4)  Contrast and directions provided. 5)  We scheduled the Colonoscopy with Dr Juanda Chance for 11-22-09 at 9:30 AM.  6)  Directions and brochure provided. 7)  [facility] Patient  Information Guide given to patient. 8)  We sent a prescription for Bentyl  to your pharmacy. 9)  We also sent one for the Colonoscopy prep. Prescriptions: REGLAN 10 MG  TABS (METOCLOPRAMIDE HCL) As per prep instructions.  #2 x 0   Entered by:   Lowry Ram NCMA   Authorized by:   Sammuel Cooper PA-c   Signed by:   Lowry Ram NCMA on 11/01/2009   Method used:   Electronically to        [facility] [phone]* (retail)       [address]       [place], [place]  [address]       Ph: [phone]       Fax: [phone]   RxID:   [id number] DULCOLAX 5 MG  TBEC (BISACODYL) Day before procedure take 2 at 3pm and 2 at 8pm.  #4 x 0   Entered by:   Lowry Ram NCMA   Authorized by:   Sammuel Cooper PA-c   Signed by:   Lowry Ram NCMA on 11/01/2009   Method used:   Electronically to        [facility] [phone]* (retail)       [address]       [place], [place]  [address]       Ph: [phone]       Fax: [phone]   RxID:   [id number] MIRALAX   POWD (POLYETHYLENE GLYCOL 3350) As per prep  instructions.  #255gm x 0   Entered by:   Lowry Ram NCMA   Authorized by:   Sammuel Cooper PA-c   Signed by:   Lowry Ram NCMA on 11/01/2009   Method used:   Electronically to        [facility] [phone]* (retail)       [address]       [place], [place]  [address]       Ph: [phone]       Fax: [phone]   RxID:   [id number] BENTYL 10 MG CAPS (DICYCLOMINE HCL) Take 1 tab 2-3 times daily as needed for abd pain and spasms  #90 x 1   Entered by:   Lowry Ram NCMA   Authorized by:   Sammuel Cooper PA-c   Signed by:   Lowry Ram NCMA on 11/01/2009   Method used:   Electronically to        [facility] [phone]* (retail)       [address]       [place], [place]  [address]       Ph: [phone]       Fax: [phone]   RxID:   [id number]

## 2010-05-09 NOTE — Letter (Signed)
Summary: [facility]   Imported By: Sherian Rein 10/12/2009 11:27:29  _____________________________________________________________________  External Attachment:    Type:   Image     Comment:   External Document

## 2010-05-09 NOTE — Progress Notes (Signed)
Summary: PV procedure  Phone Note Other Incoming   Summary of Call: Received call from pt. He would like to schedule PV procedure for September 14, 2009. Procedure scheduled for September 14, 2009 at 10:30. Pt to come to office on September 12, 2009 for lab work. I gave pt all instructions verbally and he voiced understanding. Will mail printed copy of instructions to him Initial call taken by: Dossie Arbour, RN, BSN,  Aug 30, 2009 5:34 PM     Appended Document: Orders Update    Clinical Lists Changes  Orders: Added new Referral order of PV Procedure (PV Procedure) - Signed

## 2010-05-09 NOTE — Procedures (Signed)
Summary: Colonoscopy  Patient: [name] Note: All result statuses are Final unless otherwise noted.  Tests: (1) Colonoscopy (COL)   COL Colonoscopy           DONE     [facility]     [address]     [place], [place]  [address]           COLONOSCOPY PROCEDURE REPORT           PATIENT:  [name], [name]  MR#:  [id number]     BIRTHDATE:  01/11/1941, 68 yrs. old  GENDER:  male     ENDOSCOPIST:  Hedwig Morton. Juanda Chance, MD     REF. BY:     PROCEDURE DATE:  11/22/2009     PROCEDURE:  Colonoscopy 19147     ASA CLASS:  Class II     INDICATIONS:  hematochezia s/p pelvic radiation for prostate     cancer 2010,     prior colon 2006 was screening     Plavix d/c'ed last week permanently     MEDICATIONS:   Versed 7 mg, Fentanyl 75 mcg           DESCRIPTION OF PROCEDURE:   After the risks benefits and     alternatives of the procedure were thoroughly explained, informed     consent was obtained.  Digital rectal exam was performed and     revealed no abnormalities.   The LB [id number] endoscope     was introduced through the anus and advanced to the cecum, which     was identified by both the appendix and ileocecal valve, without     limitations.  The quality of the prep was excellent, using     MiraLax.  The instrument was then slowly withdrawn as the colon     was fully examined.     <<PROCEDUREIMAGES>>           FINDINGS:  Colitis was found in the rectum and sigmoid colon.     granular mucosa, telengiectasias, mucosal hemorrhages With     standard forceps, biopsy was obtained and sent to pathology (see     image6, image5, and image4).  Internal hemorrhoids were found (see     image7).  Mild diverticulosis was found in the sigmoid colon (see     image3).  This was otherwise a normal examination of the colon     (see image1 and image2).   Retroflexed views in the rectum     revealed no abnormalities.    The scope was then withdrawn from     the patient and the  procedure completed.           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           ______________________________     Hedwig Morton. Juanda Chance, MD           CC:  n.     eSIGNED:   Hedwig Morton. Ruhi Kopke at 11/22/2009 10:22 AM           Mitchell Heir, [id number]  Note: An exclamation mark (!) indicates a result that was not dispersed into the flowsheet. Document Creation Date: 11/22/2009 10:23 AM _______________________________________________________________________  (1) Order result status: Final Collection or observation date-time: 11/22/2009 10:14 Requested date-time:  Receipt date-time:  Reported date-time:  Referring Physician:   Ordering Physician: Lina Sar [phone]) Specimen Source:  Source: Launa Grill Order Number: [id number] Lab site:   Appended Document: Colonoscopy recall colon 7 years  Appended Document: Colonoscopy     Procedures Next Due Date:    Colonoscopy: 11/2016

## 2010-05-09 NOTE — Miscellaneous (Signed)
Summary: Orders Update  Clinical Lists Changes  Orders: Added new Test order of Arterial Duplex Lower Extremity (Arterial Duplex Low) - Signed 

## 2010-05-09 NOTE — Letter (Signed)
Summary: Peripheral Vascular  [facility], Main Office  [address]   [place], [place] [address]   Phone: [phone]  Fax: [phone]     07/21/2009 MRN: [id number]  [name] [address] [place], [place]  [address]  Dear Mr. Profeta,   You are scheduled for Peripheral Vascular Angiogram on  Aug 17, 2009             with Dr.Roshini Fulwider            .  Please arrive at the [facility] at 9:30      a.m. on the day of your procedure.  1. DIET     __X__ Nothing to eat or drink after midnight except your medications with a sip of water.  2. Come to the [place] office on  Aug 15, 2009           for lab work.  The lab at [facility] is open from 8:30 a.m. to 1:30 p.m. and 2:30 p.m. to 5:00 p.m.  The lab at [address] is open from 7:30 a.m. to 5:30 p.m.  You do not have to be fasting.  3. MAKE SURE YOU TAKE YOUR ASPIRIN.  4. _____ DO NOT TAKE these medications before your procedure:         ________________________________________________________________________________      __X__ YOU MAY TAKE ALL of your remaining medications with a small amount of water.      ____ START NEW medications:     ________________________________________________________________________________      ____ Eilene Ghazi instructions:     ________________________________________________________________________________  5. Plan for one night stay - bring personal belongings (i.e. toothpaste, toothbrush, etc.)  6. Bring a current list of your medications and current insurance cards.  7. Must have a responsible person to drive you home.   8. Someone must be with yu for the first 24 hours after you arrive home.  9. Please wear clothes that are easy to get on and off and wear slip-on shoes.  *Special note: Every effort is made to have your procedure done on time.  Occasionally there are emergencies that present themselves at  the hospital that may cause delays.  Please be patient if a delay does occur.  If you have any questions after you get home, please call the office at the number listed above.   Dossie Arbour, RN, BSN

## 2010-05-09 NOTE — Assessment & Plan Note (Signed)
Summary: per check out   Referring Provider:  Crecencio Mc Primary Provider:  [name] Plotnikov MD  CC:  No complaints.  History of Present Illness: Patient is 70 years old and return for followup management of CAD and followup evaluation for claudication and neurological symptoms. When I saw him last time he was having symptoms suggestive of a TIA. We got an echocardiogram which showed good LV function and mild LVH. We got carotid Doppler studies which showed 0-39% stenosis bilaterally. He was seen in consultation by neurology who did an MRI which was normal but his symptoms were most compatible with vasovagal syncope.  He also has had bilateral leg pain with walking. Difficult to tell whether this is claudication or related to nerve problem is chronic degenerative disc disease. We did Doppler studies and he had a index of 0.93 on the right and 0.91 on the left. His waveforms were abnormal.  He had PTA of the right iliac in 1992.  He has known coronary disease and had bypass surgery in 1993 and his LIMA graft was occluded but is a patent vein graft to the circumflex system supplies the system well. Had a negative Myoview scan in February of this ejection fraction is 60%.  He is also scheduled to have eye surgery at [facility] for ptosis.  Current Medications (verified): 1)  Isosorbide Mononitrate Cr 30 Mg Tb24 (Isosorbide Mononitrate) .Marland Kitchen.. 1 Tab Once Daily 2)  Vytorin 10-20 Mg Tabs (Ezetimibe-Simvastatin) .... One By Mouth Every Other Day 3)  Loratadine 10 Mg  Tabs (Loratadine) .... Once Daily As Needed Allergies 4)  Aspirin 81 Mg  Tbec (Aspirin) .... One By Mouth Bid 5)  Lorazepam 0.5 Mg Tabs (Lorazepam) .... One By Mouth At Bedtime 6)  Metoprolol Succinate 50 Mg Xr24h-Tab (Metoprolol Succinate) .... 1/2 Tablet By Mouth Daily 7)  Norvasc 5 Mg Tabs (Amlodipine Besylate) .... One By Mouth Daily 8)  Omeprazole 20 Mg Cpdr (Omeprazole) .... One By Mouth Daily 9)  Multivitamins  Tabs  (Multiple Vitamin) .... Once Daily 10)  Vitamin D3 1000 Unit Caps (Cholecalciferol) .Marland Kitchen.. 1 By Mouth Daily 11)  Tramadol Hcl 50 Mg Tabs (Tramadol Hcl) .Marland Kitchen.. 1 By Mouth As Needed  Allergies (verified): 1)  ! Codeine Sulfate (Codeine Sulfate)  Past History:  Past Medical History: Reviewed history from 03/22/2009 and no changes required. COPD Coronary artery disease S/P LIMA-OM, SVG-PDA 1993 CATH 1999 LIMA ATROPHIC, SVG OK Hyperlipidemia Low back pain Peripheral neuropathy Osteoarthritis Peripheral vascular disease ANGIOPLASTY RIGHT ILIAC 8/92, bilateral carotid bruits. Elev PSA Dr Vernie Ammons and Dr Laverle Patter Allergic rhinitis Prostate cancer, hx of 2010 XRT L abd hernia post-op Transient ischemic attack, hx of 2010 Dr Anne Hahn  Review of Systems       ROS is negative except as outlined in HPI.   Vital Signs:  Patient profile:   70 year old male Height:      71 inches Weight:      167 pounds BMI:     23.38 Pulse rate:   55 / minute Resp:     16 per minute BP sitting:   122 / 72  (left arm)  Vitals Entered By: Marrion Coy, CNA (May 18, 2009 9:52 AM)  Physical Exam  Additional Exam:  Gen. Well-nourished, in no distress   Neck: No JVD, thyroid not enlarged, no carotid bruits Lungs: No tachypnea, clear without rales, rhonchi or wheezes Cardiovascular: Rhythm regular, PMI not displaced,  heart sounds  normal, no murmurs or gallops, no  peripheral edema, pulses normal in all 4 extremities. Abdomen: BS normal, abdomen soft and non-tender without masses or organomegaly, no hepatosplenomegaly. MS: No deformities, no cyanosis or clubbing   Neuro:  No focal sns   Skin:  no lesions    Impression & Recommendations:  Problem # 1:  TRANSIENT ISCHEMIC ATTACK, HX OF (ICD-V12.50) Hhad symptoms suggestive of a TIA but his MRI was normal and he did not have severe disease in his carotid arteries and his echocardiogram showed no source of emboli. Neurology felt this was most probably a  vasovagal type of symptoms.  Problem # 2:  PVD WITH CLAUDICATION (ICD-443.89) He has symptoms suggestive of claudication and has abnormal Doppler studies. He also has degenerative disease of the lumbar spine with nerve problems in this difficult to separate the 2 issues. I will arrange for him to have a PE consultation with Dr. Max Fickle.  Problem # 3:  CORONARY ARTERY DISEASE (ICD-414.00) He had bypass surgery in 1993 and had a LIMA graft and the vein graft to the circumflex system. The LIMA graft occluded but the vein graft supplied the entire circulation was also is not have any problems. He is stable from the standpoint of his heart and I think it is okay for him to proceed with high surgery under general anesthesia. His updated medication list for this problem includes:    Isosorbide Mononitrate Cr 30 Mg Tb24 (Isosorbide mononitrate) .Marland Kitchen... 1 tab once daily    Aspirin 81 Mg Tbec (Aspirin) ..... One by mouth bid    Metoprolol Succinate 50 Mg Xr24h-tab (Metoprolol succinate) .Marland Kitchen... 1/2 tablet by mouth daily    Norvasc 5 Mg Tabs (Amlodipine besylate) ..... One by mouth daily  His updated medication list for this problem includes:    Isosorbide Mononitrate Cr 30 Mg Tb24 (Isosorbide mononitrate) .Marland Kitchen... 1 tab once daily    Aspirin 81 Mg Tbec (Aspirin) ..... One by mouth bid    Metoprolol Succinate 50 Mg Xr24h-tab (Metoprolol succinate) .Marland Kitchen... 1/2 tablet by mouth daily    Norvasc 5 Mg Tabs (Amlodipine besylate) ..... One by mouth daily  Other Orders: Misc. Referral (Misc. Ref)  Patient Instructions: 1)  We will refer you to Dr. Clifton James for vascular disease after your eye surgery is done. 2)  Your physician wants you to follow-up in: 1 year with Dr. Clifton James for regular cardiology followup. You will receive a reminder letter in the mail two months in advance. If you don't receive a letter, please call our office to schedule the follow-up appointment. 3)  Call Dr. Regino Schultze nurse, Sherri Rad- RN,  with the contact information for your eye surgeon and we will fax them a clearance note.

## 2010-05-09 NOTE — Assessment & Plan Note (Signed)
Summary: FLU SHOT/NWS  Nurse Visit   Allergies: 1)  ! Codeine Sulfate (Codeine Sulfate)  Orders Added: 1)  Flu Vaccine 39yrs + MEDICARE PATIENTS [Q2039] 2)  Administration Flu vaccine - MCR [G0008] .lbmedflu   Flu Vaccine Consent Questions     Do you have a history of severe allergic reactions to this vaccine? no    Any prior history of allergic reactions to egg and/or gelatin? no    Do you have a sensitivity to the preservative Thimersol? no    Do you have a past history of Guillan-Barre Syndrome? no    Do you currently have an acute febrile illness? no    Have you ever had a severe reaction to latex? no    Vaccine information given and explained to patient? yes    Are you currently pregnant? no    Lot Number:[id number]   Exp Date:10/07/2010   Site Given  Left Deltoid IM Lanier Prude, [facility])  January 25, 2010 1:22 PM

## 2010-05-09 NOTE — Progress Notes (Signed)
  Faxed All Cardiac over to [facility] to fax [phone] [facility]  June 06, 2009 3:46 PM

## 2010-05-09 NOTE — Assessment & Plan Note (Signed)
Summary: 6wk f/u sl   Visit Type:  6 weeks follow up Referring Provider:  Crecencio Mc Primary Provider:  [name] Plotnikov MD  CC:  Bilateral leg pain.  History of Present Illness: 70 yo WM with history of CAD s/p CABG, PVD, hyperlipidemia who is here today for  PV follow up.  He told me at the initial visit that he can walk 1/4 of  mile before his legs begin to ache. His calf muscles get "heavy" and "hard" and then the thighs also begin to ache. This resolves mostly with rest. He also has a constant aching in both legs. He has chronic back problems and toe drop. He has had pain in both legs for years. He has seen no recent change. He has had a prior angioplasty of the right iliac artery in 1992 per records. No ulcerations of the lower extremities. He continues to smoke 1/2 ppd.  He has known mild bilateral carotid artery stenoses.   I arranged a CT angiogram which showed disease in the ostium of the right common iliac and serial lesions in the right SFA and  a total occlusion of the left SFA. We proceeded with a distal aortogram with bilateral lower ext runoff. (detailed below). I was able to open his left SFA chronic total occlusion and placed a stent. I planned on staging his right SFA stenosis for another date.   He is here today for follow up and is doing well. His pain in the left leg is much better. No left calf pain. Occasional sharp pains left thigh. ABI today are normal left leg (1.0) and 0.91 right leg. He wishes to schedule PTA of the right leg. He continues to have pain in the right calf with ambulation. He continues to smoke but has cut back.     Current Medications (verified): 1)  Isosorbide Mononitrate Cr 30 Mg Tb24 (Isosorbide Mononitrate) .Marland Kitchen.. 1 Tab Once Daily 2)  Vytorin 10-20 Mg Tabs (Ezetimibe-Simvastatin) .... One By Mouth Every Other Day 3)  Loratadine 10 Mg  Tabs (Loratadine) .... Once Daily As Needed Allergies 4)  Lorazepam 0.5 Mg Tabs (Lorazepam) .... One By Mouth At  Bedtime 5)  Metoprolol Succinate 50 Mg Xr24h-Tab (Metoprolol Succinate) .... 1/2 Tablet By Mouth Daily 6)  Norvasc 5 Mg Tabs (Amlodipine Besylate) .... One By Mouth Daily 7)  Omeprazole 20 Mg Cpdr (Omeprazole) .... One By Mouth Daily 8)  Multivitamins  Tabs (Multiple Vitamin) .... Once Daily 9)  Vitamin D3 1000 Unit Caps (Cholecalciferol) .Marland Kitchen.. 1 By Mouth Daily 10)  Tramadol Hcl 50 Mg Tabs (Tramadol Hcl) .Marland Kitchen.. 1 By Mouth As Needed 11)  Triamcinolone 0.5% Cream in Eucerin Lotion 1:5 .... Use Bid 12)  Medi-Cortisone 1 % Crea (Hydrocortisone Acetate) .... As Needed For Rash 13)  Aspirin Ec 325 Mg Tbec (Aspirin) .... Take One Tablet By Mouth Daily 14)  Plavix 75 Mg Tabs (Clopidogrel Bisulfate) .... Take One Tablet By Mouth Daily  Allergies: 1)  ! Codeine Sulfate (Codeine Sulfate)  Past History:  Past Medical History: Current Problems:  CORONARY ARTERY DISEASE (ICD-414.00)- S/P LIMA-OM, SVG-PDA 1993 CATH 1999 LIMA ATROPHIC, SVG OK TRANSIENT ISCHEMIC ATTACK, HX OF (ICD-V12.50) HYPERLIPIDEMIA (ICD-272.4) PERIPHERAL NEUROPATHY (ICD-356.9) PVD WITH CLAUDICATION (ICD-443.89)- ANGIOPLASTY RIGHT ILIAC 8/92 PTA stent left SFA 5/11.  Bilateral carotid bruits. PREOPERATIVE EXAMINATION (ICD-V72.84) BRONCHITIS, ACUTE (ICD-466.0) VENTRAL HERNIA (ICD-553.20) PROSTATE CANCER, HX OF (ICD-V10.46) s/p prostatectomy and XRT ELEVATED PROSTATE SPECIFIC ANTIGEN (ICD-790.93) ALLERGIC RHINITIS (ICD-477.9) UPPER RESPIRATORY INFECTION (URI) (ICD-465.9) OSTEOARTHRITIS (ICD-715.90)  LOW BACK PAIN (ICD-724.2) COPD (ICD-496) TOBACCO USE DISORDER/SMOKER-SMOKING CESSATION DISCUSSED (ICD-305.1) Allergic rhinitis  Past Surgical History: Reviewed history from 06/27/2009 and no changes required. Coronary artery bypass graft 1992 Lumbar laminectomy x2 (1610,9604) Prostatectomy Dr Laverle Patter 05/2008 and XRT Appendectomy Tonsillectomy  Family History: Reviewed history from 06/27/2009 and no changes required. Family  History Hypertension Mother-alive, cancer Father-deceased, CAD/MI Sister deceased cancer Brother alive, heart disease  Social History: Reviewed history from 06/27/2009 and no changes required. Retired-Self employed. Married, 3 living children Current Smoker 1/2 pack per day. Former 1ppd for 40 years. No alcohol use No illicit drug use  Review of Systems  The patient denies fatigue, malaise, fever, weight gain/loss, vision loss, decreased hearing, hoarseness, chest pain, palpitations, shortness of breath, prolonged cough, wheezing, sleep apnea, coughing up blood, abdominal pain, blood in stool, nausea, vomiting, diarrhea, heartburn, incontinence, blood in urine, muscle weakness, joint pain, leg swelling, rash, skin lesions, headache, fainting, dizziness, depression, anxiety, enlarged lymph nodes, easy bruising or bleeding, and environmental allergies.         Right leg pain with ambulation.   Vital Signs:  Patient profile:   70 year old male Height:      71 inches Weight:      168.75 pounds BMI:     23.62 Pulse rate:   56 / minute Pulse rhythm:   regular Resp:     18 per minute BP sitting:   112 / 56  (right arm) Cuff size:   large  Vitals Entered By: Vikki Ports (Aug 30, 2009 2:59 PM)  Serial Vital Signs/Assessments:  Time      Position  BP       Pulse  Resp  Temp     By           L Arm     115/54                         Vikki Ports   Physical Exam  General:  General: Well developed, well nourished, NAD Neuro: No focal deficits Musculoskeletal: Muscle strength 5/5 all ext Psychiatric: Mood and affect normal Neck: No JVD, no carotid bruits, no thyromegaly, no lymphadenopathy. Lungs:Clear bilaterally, no wheezes, rhonci, crackles CV: RRR no murmurs, gallops rubs Abdomen: soft, NT, ND, BS present Extremities: No edema, palpable pulses right and left DP/PT.  Right groin with small subcutaneous hematom. Slight bruit.     Arteriogram-Lower Extremity  Procedure  date:  08/18/2009  Findings:      1. Bilateral renal arteries were patent without any significant     disease. 2. The distal aorta had diffuse 40% plaque but no obstructive lesions     and aneurysmal segments. 3. The right common iliac artery had serial 40% stenoses but no flow     lending lesions.  There was moderate plaque in the right external     iliac artery.  The right common femoral artery was patent without     severe disease.  The right superficial femoral artery had a severe     95% stenosis in the proximal portion.  There were serial 40%     lesions throughout the mid portion of the right superficial femoral     artery.  There was 3-vessel runoff to the right foot. 4. The left common iliac artery had serial 40% lesions.  The left     external iliac artery had plaque disease.  The left common femoral     artery  had plaque disease.  The left superficial femoral artery had     50% stenosis in the proximal portion.  The left superficial femoral     artery had a 100% occlusion in the distal portion with     reconstitution via collaterals in the distal vessel.  This was     approximately a 4 cm area of occlusion.  There was 3-vessel runoff     to the left foot.   IMPRESSION: 1. Severe peripheral vascular disease with chronic total occlusion of     the left superficial femoral artery and severe stenosis in the     right superficial femoral artery in the proximal portion. 2. Successful PTA with placement of a stent and a chronic total     occlusion of the left superficial femoral artery.  Impression & Recommendations:  Problem # 1:  PVD WITH CLAUDICATION (ICD-443.89) Good result left leg with PTA/stent of the left SFA total occlusion. ABI now normal left leg. Will arrange PTA with [place] atherectomy of right SFA June 8th 2011 in main PV lab. Risks/benefits reviewed. Pt agrees to proceed. Cut ASA back to 81 mg once daily. Continue Plavix 75 mg by mouth once daily.   Problem #  2:  TOBACCO USE DISORDER/SMOKER-SMOKING CESSATION DISCUSSED (ICD-305.1) Complete smoking cessation encouraged.   Patient Instructions: 1)  Call us when you are ready to schedule procedure.

## 2010-05-09 NOTE — Letter (Signed)
Summary: [facility] Office Note  [facility] Office Note   Imported By: Roderic Ovens 05/27/2009 11:50:52  _____________________________________________________________________  External Attachment:    Type:   Image     Comment:   External Document

## 2010-05-09 NOTE — Procedures (Signed)
Summary: Colonoscopy   Colonoscopy  Procedure date:  08/30/2009  Findings:      Location:  [facility].    Procedures Next Due Date:    Colonoscopy: 09/2019 Patient Name: [name], [name] MRN:  Procedure Procedures: Colonoscopy CPT: 98119.  Personnel: Endoscopist: Anedra Penafiel L. Juanda Chance, MD.  Referred By: Linda Hedges Plotnikov, MD.  Exam Location: Exam performed in Outpatient Clinic. Outpatient  Patient Consent: Procedure, Alternatives, Risks and Benefits discussed, consent obtained, from patient. Consent was obtained by the RN.  Indications  Average Risk Screening Routine.  History  Current Medications: Patient is not currently taking Coumadin.  Pre-Exam Physical: Performed Aug 30, 2004. Entire physical exam was normal.  Exam Exam: Extent of exam reached: Cecum, extent intended: Cecum.  The cecum was identified by appendiceal orifice and IC valve. Colon retroflexion performed. Images taken. ASA Classification: II. Tolerance: good.  Monitoring: Pulse and BP monitoring, Oximetry used. Supplemental O2 given.  Colon Prep Used Miralax for colon prep. Prep results: good.  Sedation Meds: Patient assessed and found to be appropriate for moderate (conscious) sedation. Fentanyl 75 mcg. given IV. Versed 7 mg. given IV.  Findings - NORMAL EXAM: Cecum.  - DIVERTICULOSIS: Sigmoid Colon. ICD9: Diverticulosis: 562.10. Comments: few scattered diverticuli, sigmoid colon.   Assessment Abnormal examination, see findings above.  Diagnoses: 562.10: Diverticulosis.   Comments: no polyps Events  Unplanned Interventions: No intervention was required.  Unplanned Events: There were no complications. Plans Patient Education: Patient given standard instructions for: Patient instructed to get routine colonoscopy every 10 years.  Disposition: After procedure patient sent to recovery. After recovery patient sent home.    CC: Sonda Primes, MD  This report was  created from the original endoscopy report, which was reviewed and signed by the above listed endoscopist.

## 2010-05-09 NOTE — Progress Notes (Signed)
Summary: colonscopy?  Phone Note Call from Patient Call back at [facility] Phone [phone]   Caller: Patient Summary of Call: Pt left message stating that he was told by Dr. Gibson Ramp office that he needs to contact PCP to schedule a colonscopy?--please advise  Pt would like a call today please.......Marland KitchenVerdell [name]. Initial call taken by: Brenton Grills MA,  October 28, 2009 3:04 PM  Follow-up for Phone Call        GI cons Follow-up by: Tresa Garter MD,  October 28, 2009 5:39 PM  Additional Follow-up for Phone Call Additional follow up Details #1::        Pt informed that he will be contacted by [facility] with appt info for GI eval. Additional Follow-up by: Margaret Pyle, CMA,  October 29, 2009 10:00 AM  New Problems: PHYSICAL EXAMINATION (ICD-V70.0)   New Problems: PHYSICAL EXAMINATION (ICD-V70.0)

## 2010-05-09 NOTE — Assessment & Plan Note (Signed)
Summary: 4 MO ROV /NWS  #   Vital Signs:  Patient profile:   70 year old male Height:      71 inches Weight:      169 pounds BMI:     23.66 O2 Sat:      96 % on Room air Temp:     98.0 degrees F oral Pulse rate:   58 / minute Pulse rhythm:   regular Resp:     16 per minute BP sitting:   110 / 60  (left arm) Cuff size:   regular  Vitals Entered By: Lanier Prude, CMA(AAMA) (November 14, 2009 10:00 AM)  O2 Flow:  Room air CC: 4 mo f/u Is Patient Diabetic? No   Primary Care Provider:  Tresa Garter MD  CC:  4 mo f/u.  History of Present Illness: The patient presents for a follow up of hypertension, PVD, LBP, hyperlipidemia   Current Medications (verified): 1)  Isosorbide Mononitrate Cr 30 Mg Tb24 (Isosorbide Mononitrate) .Marland Kitchen.. 1 Tab Once Daily 2)  Vytorin 10-20 Mg Tabs (Ezetimibe-Simvastatin) .... One By Mouth Every Other Day 3)  Loratadine 10 Mg  Tabs (Loratadine) .... Once Daily As Needed Allergies 4)  Lorazepam 0.5 Mg Tabs (Lorazepam) .... One By Mouth At Bedtime 5)  Metoprolol Succinate 50 Mg Xr24h-Tab (Metoprolol Succinate) .... 1/2 Tablet By Mouth Daily 6)  Norvasc 5 Mg Tabs (Amlodipine Besylate) .... One By Mouth Daily 7)  Omeprazole 20 Mg Cpdr (Omeprazole) .... One By Mouth Daily 8)  Multivitamins  Tabs (Multiple Vitamin) .... Once Daily 9)  Vitamin D3 1000 Unit Caps (Cholecalciferol) .Marland Kitchen.. 1 By Mouth Daily 10)  Tramadol Hcl 50 Mg Tabs (Tramadol Hcl) .Marland Kitchen.. 1 By Mouth As Needed 11)  Triamcinolone 0.5% Cream in Eucerin Lotion 1:5 .... Use Bid 12)  Medi-Cortisone 1 % Crea (Hydrocortisone Acetate) .... As Needed For Rash 13)  Aspirin Ec 325 Mg Tbec (Aspirin) .... Take One Tablet By Mouth Daily 14)  Bentyl 10 Mg Caps (Dicyclomine Hcl) .... Take 1 Tab 2-3 Times Daily As Needed For Abd Pain and Spasms 15)  Miralax   Powd (Polyethylene Glycol 3350) .... As Per Prep  Instructions. 16)  Dulcolax 5 Mg  Tbec (Bisacodyl) .... Day Before Procedure Take 2 At 3pm and 2 At  8pm. 17)  Reglan 10 Mg  Tabs (Metoclopramide Hcl) .... As Per Prep Instructions. 18)  Align  Caps (Probiotic Product) .... Take 1 Cap Daily For 2 Months  Allergies (verified): 1)  ! Codeine Sulfate (Codeine Sulfate)  Past History:  Past Medical History: Last updated: 11/01/2009 Current Problems:  CORONARY ARTERY DISEASE (ICD-414.00)- S/P LIMA-OM, SVG-PDA 1993 CATH 1999 LIMA ATROPHIC, SVG OK TRANSIENT ISCHEMIC ATTACK, HX OF (ICD-V12.50) HYPERLIPIDEMIA (ICD-272.4) PERIPHERAL NEUROPATHY (ICD-356.9) PVD WITH CLAUDICATION (ICD-443.89)- ANGIOPLASTY RIGHT ILIAC 8/92 PTA stent left SFA 5/11.  S/P PTA/STENT LEFT FEMORAL ARTERY 5/11,PTA TO RIGHT FEMORAL ARTERY 6/11 Bilateral carotid bruits. PREOPERATIVE EXAMINATION (ICD-V72.84) BRONCHITIS, ACUTE (ICD-466.0) VENTRAL HERNIA (ICD-553.20) PROSTATE CANCER, HX OF (ICD-V10.46) s/p prostatectomy and XRT 2010 DIVERTICULOSIS  ALLERGIC RHINITIS (ICD-477.9)  OSTEOARTHRITIS (ICD-715.90) LOW BACK PAIN (ICD-724.2) COPD (ICD-496) TOBACCO USE DISORDER/SMOKER-SMOKING CESSATION DISCUSSED (ICD-305.1)  Past Surgical History: Last updated: 06/27/2009 Coronary artery bypass graft 1992 Lumbar laminectomy x2 (1610,9604) Prostatectomy Dr Laverle Patter 05/2008 and XRT Appendectomy Tonsillectomy  Family History: Last updated: 11/01/2009 Family History Hypertension Mother-alive, cancer Colon Cancer  Father-deceased, CAD/MI Sister deceased cancer Brother alive, heart disease  Social History: Last updated: 06/27/2009 Retired-Self employed. Married, 3 living children Current Smoker  1/2 pack per day. Former 1ppd for 40 years. No alcohol use No illicit drug use  Family History: Reviewed history from 11/01/2009 and no changes required. Family History Hypertension Mother-alive, cancer Colon Cancer  Father-deceased, CAD/MI Sister deceased cancer Brother alive, heart disease  Social History: Reviewed history from 06/27/2009 and no changes  required. Retired-Self employed. Married, 3 living children Current Smoker 1/2 pack per day. Former 1ppd for 40 years. No alcohol use No illicit drug use  Review of Systems  The patient denies fever, weight loss, weight gain, chest pain, dyspnea on exertion, prolonged cough, and abdominal pain.         LBP  Physical Exam  General:  Well developed, well nourished, in no acute distress. Smells of cigarette smoke Nose:  no deformity, discharge, inflammation, or lesions Mouth:  Teeth, gums and palate normal. Oral mucosa normal. Neck:  Neck supple, no JVD. No masses, thyromegaly or abnormal cervical nodes.Loud bilateral carotid bruits. Lungs:  decreased BS  bilateral  Heart:  Grade  /6 DM loudest at primary aortic area and S4.   Abdomen:  Bowel sounds positive; abdomen soft and non-tender without masses, organomegaly, 7 cm L ventral NT hernia noted. No hepatosplenomegaly. Markings for XRT Msk:  LS with decreased ROM.  Uses a cane B gluts and piriformis muscles are tight Neurologic:  Alert and oriented x 3. No pronator drift. CN 2-12 nonfocal Skin:  dry patches of rash  on trunk Psych:  Oriented X3.     Impression & Recommendations:  Problem # 1:  ABDOMINAL PAIN-RLQ (ICD-789.03) Assessment Improved  His updated medication list for this problem includes:    Reglan 10 Mg Tabs (Metoclopramide hcl) .Marland Kitchen... As per prep instructions.  Problem # 2:  OSTEOARTHRITIS (ICD-715.9) Assessment: Deteriorated Use stretching and balance exercises that I have provided (15 min. or longer every day)  His updated medication list for this problem includes:    Tramadol Hcl 50 Mg Tabs (Tramadol hcl) .Marland Kitchen... 1 by mouth as needed    Aspirin Ec 325 Mg Tbec (Aspirin) .Marland Kitchen... Take one tablet by mouth daily  Problem # 3:  PVD WITH CLAUDICATION (ICD-443.89) Assessment: Unchanged On the regimen of medicine(s) reflected in the chart    Problem # 4:  LOW BACK PAIN (ICD-724.2) Assessment: Deteriorated  His  updated medication list for this problem includes:    Tramadol Hcl 50 Mg Tabs (Tramadol hcl) .Marland Kitchen... 1 by mouth as needed    Aspirin Ec 325 Mg Tbec (Aspirin) .Marland Kitchen... Take one tablet by mouth daily  Complete Medication List: 1)  Isosorbide Mononitrate Cr 30 Mg Tb24 (Isosorbide mononitrate) .Marland Kitchen.. 1 tab once daily 2)  Vytorin 10-20 Mg Tabs (Ezetimibe-simvastatin) .... One by mouth every other day 3)  Loratadine 10 Mg Tabs (Loratadine) .... Once daily as needed allergies 4)  Lorazepam 0.5 Mg Tabs (Lorazepam) .... One by mouth at bedtime 5)  Metoprolol Succinate 50 Mg Xr24h-tab (Metoprolol succinate) .... 1/2 tablet by mouth daily 6)  Norvasc 5 Mg Tabs (Amlodipine besylate) .... One by mouth daily 7)  Omeprazole 20 Mg Cpdr (Omeprazole) .... One by mouth daily 8)  Multivitamins Tabs (Multiple vitamin) .... Once daily 9)  Vitamin D3 1000 Unit Caps (Cholecalciferol) .Marland Kitchen.. 1 by mouth daily 10)  Tramadol Hcl 50 Mg Tabs (Tramadol hcl) .Marland Kitchen.. 1 by mouth as needed 11)  Triamcinolone 0.5% Cream in Eucerin Lotion 1:5  .... Use bid 12)  Medi-cortisone 1 % Crea (Hydrocortisone acetate) .... As needed for rash 13)  Aspirin Ec 325  Mg Tbec (Aspirin) .... Take one tablet by mouth daily 14)  Bentyl 10 Mg Caps (Dicyclomine hcl) .... Take 1 tab 2-3 times daily as needed for abd pain and spasms 15)  Miralax Powd (Polyethylene glycol 3350) .... As per prep  instructions. 16)  Dulcolax 5 Mg Tbec (Bisacodyl) .... Day before procedure take 2 at 3pm and 2 at 8pm. 17)  Reglan 10 Mg Tabs (Metoclopramide hcl) .... As per prep instructions. 18)  Align Caps (Probiotic product) .... Take 1 cap daily for 2 months  Patient Instructions: 1)  Use stretching and balance exercises that I have provided (15 min. or longer every day) 2)  Please schedule a follow-up appointment in 3 months well w/labs.

## 2010-05-09 NOTE — Progress Notes (Signed)
Summary: stop ASA  Phone Note From Other Clinic   Caller: nurse [facility] Summary of Call: Per Corrie Dandy [facility] [phone] pt needs to come off ASA for surgery. Procedure on March 9th Initial call taken by: Edman Circle,  May 19, 2009 10:47 AM  Follow-up for Phone Call        [facility] and left message on machine making her aware that Dr. Juanda Chance and his nurse are both out of the office and that Herbert Seta will return on Monday and I will send this message to her for f/u. She will call me if she needs anything else prior to Monday.  Follow-up by: Duncan Dull, RN, BSN,  May 19, 2009 11:10 AM  Additional Follow-up for Phone Call Additional follow up Details #1::        OK to hold asa. BB Additional Follow-up by: Lenoria Farrier, MD, [facility],  May 22, 2009 6:35 PM     Appended Document: stop ASA I called and left a message on Mary's VM that the pt may hold aspirin for 5 days prior to surgery.

## 2010-05-09 NOTE — Letter (Signed)
Summary: Patient Notice- Colon Biospy Results  [facility]  [address] [place], [place] [address]   Phone: [phone]  Fax: [phone]        November 24, 2009 MRN: [id number]    [name] [address] [place], [place]  [address]    Dear [name],  I am pleased to inform you that the biopsies taken during your recent colonoscopy did not show any evidence of cancer upon pathologic examination.The biopsies show mild  colitis, from radiation.  Additional information/recommendations:  __No further action is needed at this time.  Please follow-up with      your primary care physician for your other healthcare needs.  __Please call [phone] to schedule a return visit to review      your condition.  _x_Continue with the treatment plan as outlined on the day of your      exam.  x__You should have a repeat colonoscopy examination for this problem           in _7 years.  Please call us if you are having persistent problems or have questions about your condition that have not been fully answered at this time.  Sincerely,  Hart Carwin MD   This letter has been electronically signed by your physician.  Appended Document: Patient Notice- Colon Biospy Results letter mailed

## 2010-05-09 NOTE — Assessment & Plan Note (Signed)
Summary: 3 MO ROV /NWS  #   Vital Signs:  Patient profile:   70 year old male Height:      71 inches Weight:      168 pounds BMI:     23.52 Temp:     98.8 degrees F oral Pulse rate:   64 / minute Pulse rhythm:   regular Resp:     16 per minute BP sitting:   100 / 58  (left arm) Cuff size:   regular  Vitals Entered By: Lanier Prude, Beverly Gust) (February 20, 2010 2:43 PM) CC: 3 mo f/u  Comments pt is not taking Triamcinolone, Bentyl, Align or Hydroco supp. Please remove from list   Primary Care Provider:  Tresa Garter MD  CC:  3 mo f/u .  History of Present Illness: The patient presents for a follow up of hypertension, PVD, hyperlipidemia, OA The patient presents for a preventive health examination  Patient past medical history, social history, and family history reviewed in detail no significant changes.  Patient is physically active. Depression is negative and mood is good. Hearing is normal, and able to perform activities of daily living. Risk of falling is low and home safety has been reviewed - using a cane- and is appropriate. Patient has normal height, weight, and visual acuity. Patient has been counseled on age-appropriate routine health concerns for screening and prevention. Education, counseling done.    Preventive Screening-Counseling & Management  Alcohol-Tobacco     Alcohol drinks/day: 0     Smoking Status: current  Caffeine-Diet-Exercise     Caffeine Counseling: decrease use of caffeine     Diet Counseling: to improve diet; diet is suboptimal     Nutrition Referrals: no     Does Patient Exercise: no     Exercise Counseling: to improve exercise regimen     Depression Counseling: not indicated; screening negative for depression  Hep-HIV-STD-Contraception     Hepatitis Risk: no risk noted     Dental Visit-last 6 months no     Sun Exposure-Excessive: no  Safety-Violence-Falls     Seat Belt Use: yes      Sexual History:  currently monogamous.     Current Medications (verified): 1)  Isosorbide Mononitrate Cr 30 Mg Tb24 (Isosorbide Mononitrate) .Marland Kitchen.. 1 Tab Once Daily 2)  Vytorin 10-20 Mg Tabs (Ezetimibe-Simvastatin) .... One By Mouth Every Other Day 3)  Loratadine 10 Mg  Tabs (Loratadine) .... Once Daily As Needed Allergies 4)  Lorazepam 0.5 Mg Tabs (Lorazepam) .... One By Mouth At Bedtime 5)  Metoprolol Succinate 50 Mg Xr24h-Tab (Metoprolol Succinate) .... 1/2 Tablet By Mouth Daily 6)  Norvasc 5 Mg Tabs (Amlodipine Besylate) .... One By Mouth Daily 7)  Omeprazole 20 Mg Cpdr (Omeprazole) .... One By Mouth Daily 8)  Multivitamins  Tabs (Multiple Vitamin) .... Once Daily 9)  Vitamin D3 1000 Unit Caps (Cholecalciferol) .Marland Kitchen.. 1 By Mouth Daily 10)  Tramadol Hcl 50 Mg Tabs (Tramadol Hcl) .Marland Kitchen.. 1 By Mouth As Needed 11)  Triamcinolone 0.5% Cream in Eucerin Lotion 1:5 .... Use Bid 12)  Medi-Cortisone 1 % Crea (Hydrocortisone Acetate) .... As Needed For Rash 13)  Aspirin Ec 325 Mg Tbec (Aspirin) .... Take One Tablet By Mouth Daily 14)  Bentyl 10 Mg Caps (Dicyclomine Hcl) .... Take 1 Tab 2-3 Times Daily As Needed For Abd Pain and Spasms 15)  Align  Caps (Probiotic Product) .... Take 1 Cap Daily For 2 Months 16)  Hydrocortisone Acetate 25 Mg Supp (Hydrocortisone Acetate) .Marland KitchenMarland KitchenMarland Kitchen  At Bedtime As Needed For Rectal Bleeding  Allergies (verified): 1)  ! Codeine Sulfate (Codeine Sulfate)  Past History:  Past Medical History: Last updated: 11/01/2009 Current Problems:  CORONARY ARTERY DISEASE (ICD-414.00)- S/P LIMA-OM, SVG-PDA 1993 CATH 1999 LIMA ATROPHIC, SVG OK TRANSIENT ISCHEMIC ATTACK, HX OF (ICD-V12.50) HYPERLIPIDEMIA (ICD-272.4) PERIPHERAL NEUROPATHY (ICD-356.9) PVD WITH CLAUDICATION (ICD-443.89)- ANGIOPLASTY RIGHT ILIAC 8/92 PTA stent left SFA 5/11.  S/P PTA/STENT LEFT FEMORAL ARTERY 5/11,PTA TO RIGHT FEMORAL ARTERY 6/11 Bilateral carotid bruits. PREOPERATIVE EXAMINATION (ICD-V72.84) BRONCHITIS, ACUTE (ICD-466.0) VENTRAL HERNIA  (ICD-553.20) PROSTATE CANCER, HX OF (ICD-V10.46) s/p prostatectomy and XRT 2010 DIVERTICULOSIS  ALLERGIC RHINITIS (ICD-477.9)  OSTEOARTHRITIS (ICD-715.90) LOW BACK PAIN (ICD-724.2) COPD (ICD-496) TOBACCO USE DISORDER/SMOKER-SMOKING CESSATION DISCUSSED (ICD-305.1)  Past Surgical History: Last updated: 06/27/2009 Coronary artery bypass graft 1992 Lumbar laminectomy x2 (4854,6270) Prostatectomy Dr Laverle Patter 05/2008 and XRT Appendectomy Tonsillectomy  Family History: Last updated: 11/01/2009 Family History Hypertension Mother-alive, cancer Colon Cancer  Father-deceased, CAD/MI Sister deceased cancer Brother alive, heart disease  Social History: Last updated: 06/27/2009 Retired-Self employed. Married, 3 living children Current Smoker 1/2 pack per day. Former 1ppd for 40 years. No alcohol use No illicit drug use  Social History: Does Patient Exercise:  no Hepatitis Risk:  no risk noted Dental Care w/in 6 mos.:  no Sun Exposure-Excessive:  no Seat Belt Use:  yes Sexual History:  currently monogamous  Review of Systems       The patient complains of dyspnea on exertion and difficulty walking.  The patient denies anorexia, fever, weight loss, weight gain, vision loss, decreased hearing, hoarseness, chest pain, syncope, peripheral edema, prolonged cough, headaches, hemoptysis, abdominal pain, melena, hematochezia, severe indigestion/heartburn, hematuria, incontinence, genital sores, muscle weakness, suspicious skin lesions, transient blindness, depression, unusual weight change, abnormal bleeding, enlarged lymph nodes, angioedema, and testicular masses.    Physical Exam  General:  Well developed, well nourished, in no acute distress. Smells of cigarette smoke Head:  Normocephalic and atraumatic without obvious abnormalities. No apparent alopecia or balding. Eyes:  No corneal or conjunctival inflammation noted. EOMI. Perrla.  Ears:  wax R>>L Nose:  no deformity, discharge,  inflammation, or lesions Mouth:  Teeth, gums and palate normal. Oral mucosa normal. Neck:  Neck supple, no JVD. No masses, thyromegaly or abnormal cervical nodes.Loud bilateral carotid bruits. Lungs:  decreased BS  bilateral  Heart:  Grade  /6 DM loudest at primary aortic area and S4.   Abdomen:  Bowel sounds positive; abdomen soft and non-tender without masses, organomegaly, 7 cm L ventral NT hernia noted. No hepatosplenomegaly.  Prostate:  NE   Impression & Recommendations:  Problem # 1:  HEALTH MAINTENANCE EXAM (ICD-V70.0) Assessment New  Overall doing well, age appropriate education and counseling updated and referral for appropriate preventive services done unless declined, immunizations up to date or declined, diet counseling done if overweight, urged to quit smoking if smokes, most recent labs reviewed and current ordered if appropriate, ecg reviewed or declined (interpretation per ECG scanned in the EMR if done); information regarding Medicare Preventation requirements given if appropriate.  I have personally reviewed the Medicare Annual Wellness questionnaire and have noted 1.   The patient's medical and social history 2.   Their use of alcohol, tobacco or illicit drugs 3.   Their current medications and supplements 4.   The patient's functional ability including ADL's, fall risks, home safety risks and hearing or visual             impairment. 5.   Diet and physical activities  6.   Evidence for depression or mood disorders  The patients weight, height, BMI and visual acuity have been recorded in the chart I have made referrals, counseling and provided education to the patient based review of the above and I have provided the pt with a written personalized care plan for preventive services.   Orders: Medicare -1st Annual Wellness Visit [id number])  Problem # 2:  HYPERLIPIDEMIA (ICD-272.4) Assessment: Unchanged  His updated medication list for this problem includes:    Vytorin  10-20 Mg Tabs (Ezetimibe-simvastatin) ..... One by mouth every other day  Problem # 3:  CERUMEN IMPACTION (ICD-380.4) R>>L Assessment: New  Procedure: ear irrigation Reason: wax impaction R>>L Risks/benefis were discussed. Both ears were irrigated with warm water. Large ammount of wax was recovered. Instrumentation with metal ear loop was performed to accomplish the removal. Tolerated well Complications: none    Orders: Cerumen Impaction Removal (81191)  Complete Medication List: 1)  Isosorbide Mononitrate Cr 30 Mg Tb24 (Isosorbide mononitrate) .Marland Kitchen.. 1 tab once daily 2)  Vytorin 10-20 Mg Tabs (Ezetimibe-simvastatin) .... One by mouth every other day 3)  Loratadine 10 Mg Tabs (Loratadine) .... Once daily as needed allergies 4)  Lorazepam 0.5 Mg Tabs (Lorazepam) .... One by mouth at bedtime 5)  Metoprolol Succinate 50 Mg Xr24h-tab (Metoprolol succinate) .... 1/2 tablet by mouth daily 6)  Norvasc 5 Mg Tabs (Amlodipine besylate) .... One by mouth daily 7)  Omeprazole 20 Mg Cpdr (Omeprazole) .... One by mouth daily 8)  Multivitamins Tabs (Multiple vitamin) .... Once daily 9)  Vitamin D3 1000 Unit Caps (Cholecalciferol) .Marland Kitchen.. 1 by mouth daily 10)  Tramadol Hcl 50 Mg Tabs (Tramadol hcl) .Marland Kitchen.. 1 by mouth as needed 11)  Triamcinolone 0.5% Cream in Eucerin Lotion 1:5  .... Use bid 12)  Medi-cortisone 1 % Crea (Hydrocortisone acetate) .... As needed for rash 13)  Aspirin Ec 325 Mg Tbec (Aspirin) .... Take one tablet by mouth daily 14)  Bentyl 10 Mg Caps (Dicyclomine hcl) .... Take 1 tab 2-3 times daily as needed for abd pain and spasms 15)  Align Caps (Probiotic product) .... Take 1 cap daily for 2 months 16)  Hydrocortisone Acetate 25 Mg Supp (Hydrocortisone acetate) .... At bedtime as needed for rectal bleeding  Other Orders: Tdap => 53yrs IM (47829) Admin 1st Vaccine (56213)  Patient Instructions: 1)  Please schedule a follow-up appointment in 4 months. 2)  BMP prior to visit,  ICD-9:401.1   Orders Added: 1)  Medicare -1st Annual Wellness Visit [G0438] 2)  Cerumen Impaction Removal [69210] 3)  Tdap => 65yrs IM [90715] 4)  Admin 1st Vaccine [08657]   Immunizations Administered:  Tetanus Vaccine:    Vaccine Type: Tdap    Site: left deltoid    Mfr: GlaxoSmithKline    Dose: 0.5 ml    Route: IM    Given by: Lanier Prude, CMA(AAMA)    Exp. Date: 01/27/2012    Lot #: [id number]    VIS given: 02/25/08 version given February 20, 2010.   Immunizations Administered:  Tetanus Vaccine:    Vaccine Type: Tdap    Site: left deltoid    Mfr: GlaxoSmithKline    Dose: 0.5 ml    Route: IM    Given by: Lanier Prude, CMA(AAMA)    Exp. Date: 01/27/2012    Lot #: [id number]    VIS given: 02/25/08 version given February 20, 2010.

## 2010-05-09 NOTE — Progress Notes (Signed)
Summary: triage  Phone Note Call from Patient Call back at Home Phone [phone]   Caller: Patient Call For: Dr. Juanda Chance Reason for Call: Talk to Nurse Summary of Call: black stools x 2-3 weeks and abd pain... referral sent from Dr. Posey Rea Initial call taken by: Vallarie Mare,  October 31, 2009 2:51 PM  Follow-up for Phone Call        patient had several week hx of dark blood in stool, change in bowel habits.  patient reports hx of prostate surgery and radiation lst year for prostate CA.  He reports he is alos on plavix and asa.  Patient  will come in and see Mike Gip PA tomorrow at 11/01/09 3:00 Follow-up by: Darcey Nora RN, CGRN,  October 31, 2009 3:56 PM

## 2010-05-09 NOTE — Letter (Signed)
Summary: [facility]  [facility]   Imported By: Kassie Mends 04/25/2009 14:37:42  _____________________________________________________________________  External Attachment:    Type:   Image     Comment:   External Document

## 2010-05-09 NOTE — Letter (Signed)
Summary: Application for Handicapped Placard  Application for Handicapped Placard   Imported By: Maryln Gottron 05/30/2007 13:28:27  _____________________________________________________________________  External Attachment:    Type:   Image     Comment:   External Document

## 2010-05-09 NOTE — Miscellaneous (Signed)
Summary: proctocort-rx  Clinical Lists Changes  Medications: Added new medication of HYDROCORTISONE ACETATE 25 MG SUPP (HYDROCORTISONE ACETATE) at bedtime as needed for rectal bleeding - Signed Rx of HYDROCORTISONE ACETATE 25 MG SUPP (HYDROCORTISONE ACETATE) at bedtime as needed for rectal bleeding;  #20 x 1;  Signed;  Entered by: Greer Ee RN;  Authorized by: Hart Carwin MD;  Method used: Electronically to [facility] [phone]*, [address] [place], [place]  [address], Ph: [phone], Fax: [phone]    Prescriptions: HYDROCORTISONE ACETATE 25 MG SUPP (HYDROCORTISONE ACETATE) at bedtime as needed for rectal bleeding  #20 x 1   Entered by:   Greer Ee RN   Authorized by:   Hart Carwin MD   Signed by:   Greer Ee RN on 11/22/2009   Method used:   Electronically to        [facility] [phone]* (retail)       [address]       [place], [place]  [address]       Ph: [phone]       Fax: [phone]   RxID:   [id number]

## 2010-05-09 NOTE — Assessment & Plan Note (Signed)
Summary: claudication and abn. test/saf   Visit Type:  new pt visit Referring Provider:  Crecencio Mc Primary Provider:  Tresa Garter MD  CC:  bilateral leg pain and pt states he has back problems as well...some edema/legs/feet...denies any cp or sob.  History of Present Illness: 70 yo WM with history of CAD s/p CABG, PVD, hyperlipidemia who is here today as a new PV consult. He tells me that he can 1/4 of  mile before his legs begin to ache. His calf muscles get "heavy" and "hard" and then the thighs also begin to ache. This resolves mostly with rest. He also has a constant aching in both legs. He has chronic back problems and toe drop. He has had pain in both legs for years. His leg pain has been present for many years. He has seen no recent change. He has had a prior angioplasty of the right iliac artery in 1992 per records. No ulcerations of the lower extremities. He continues to smoke 1/2 ppd.  He has known mild bilateral carotid artery stenoses.   Current Medications (verified): 1)  Isosorbide Mononitrate Cr 30 Mg Tb24 (Isosorbide Mononitrate) .Marland Kitchen.. 1 Tab Once Daily 2)  Vytorin 10-20 Mg Tabs (Ezetimibe-Simvastatin) .... One By Mouth Every Other Day 3)  Loratadine 10 Mg  Tabs (Loratadine) .... Once Daily As Needed Allergies 4)  Aspirin 81 Mg  Tbec (Aspirin) .Marland Kitchen.. 1 Tab Once Daily 5)  Lorazepam 0.5 Mg Tabs (Lorazepam) .... One By Mouth At Bedtime 6)  Metoprolol Succinate 50 Mg Xr24h-Tab (Metoprolol Succinate) .... 1/2 Tablet By Mouth Daily 7)  Norvasc 5 Mg Tabs (Amlodipine Besylate) .... One By Mouth Daily 8)  Omeprazole 20 Mg Cpdr (Omeprazole) .... One By Mouth Daily 9)  Multivitamins  Tabs (Multiple Vitamin) .... Once Daily 10)  Vitamin D3 1000 Unit Caps (Cholecalciferol) .Marland Kitchen.. 1 By Mouth Daily 11)  Tramadol Hcl 50 Mg Tabs (Tramadol Hcl) .Marland Kitchen.. 1 By Mouth As Needed  Allergies: 1)  ! Codeine Sulfate (Codeine Sulfate)  Past History:  Past Medical History: Current Problems:    CORONARY ARTERY DISEASE (ICD-414.00)- S/P LIMA-OM, SVG-PDA 1993 CATH 1999 LIMA ATROPHIC, SVG OK TRANSIENT ISCHEMIC ATTACK, HX OF (ICD-V12.50) HYPERLIPIDEMIA (ICD-272.4) PERIPHERAL NEUROPATHY (ICD-356.9) PVD WITH CLAUDICATION (ICD-443.89)- ANGIOPLASTY RIGHT ILIAC 8/92, bilateral carotid bruits. PREOPERATIVE EXAMINATION (ICD-V72.84) BRONCHITIS, ACUTE (ICD-466.0) VENTRAL HERNIA (ICD-553.20) PROSTATE CANCER, HX OF (ICD-V10.46) s/p prostatectomy and XRT ELEVATED PROSTATE SPECIFIC ANTIGEN (ICD-790.93) ALLERGIC RHINITIS (ICD-477.9) UPPER RESPIRATORY INFECTION (URI) (ICD-465.9) OSTEOARTHRITIS (ICD-715.90) LOW BACK PAIN (ICD-724.2) COPD (ICD-496) TOBACCO USE DISORDER/SMOKER-SMOKING CESSATION DISCUSSED (ICD-305.1)  Past Surgical History: Coronary artery bypass graft 1992 Lumbar laminectomy x2 (7846,9629) Prostatectomy Dr Laverle Patter 05/2008 and XRT Appendectomy Tonsillectomy  Family History: Family History Hypertension Mother-alive, cancer Father-deceased, CAD/MI Sister deceased cancer Brother alive, heart disease  Social History: Retired-Self employed. Married, 3 living children Current Smoker 1/2 pack per day. Former 1ppd for 40 years. No alcohol use No illicit drug use  Review of Systems       The patient complains of joint pain.         See HPI. Bilateral leg pain with ambulation.   Vital Signs:  Patient profile:   70 year old male Height:      71 inches Weight:      167 pounds BMI:     23.38 Pulse rate:   52 / minute Pulse rhythm:   irregular BP sitting:   110 / 60  (left arm) Cuff size:   regular  Vitals Entered By: Okey Regal  Denyse Amass, CMA (June 27, 2009 9:10 AM)  Physical Exam  General:  General: Well developed, well nourished, NAD HEENT: OP clear, mucus membranes moist SKIN: warm, dry Neuro: No focal deficits Musculoskeletal: Muscle strength 5/5 all ext Psychiatric: Mood and affect normal Neck: No JVD, no carotid bruits, no thyromegaly, no  lymphadenopathy. Lungs:Clear bilaterally, no wheezes, rhonci, crackles CV: RRR no murmurs, gallops rubs Abdomen: soft, NT, ND, BS present Extremities: No edema, pedal pulses non-palpable.     Arterial Doppler  Procedure date:  03/31/2009  Findings:      ABI right 0.93, Left 0.91. Waveforms mildly broadened and biphasic throughout.  Impression & Recommendations:  Problem # 1:  PVD WITH CLAUDICATION (ICD-443.89) Mild reduction in bilateral ABI with symptoms c/w claudication and prior iliac artery PTA on the right in 1992. Difficult to say if his symptoms are from his back pain or from obstructive disease. I will arrange a CT angiogram of the distal aorta and bilateral lower extremities to better define his anatomy. Check BMET today before the CTA. I will see him back in several weeks to review and make further plans at that time.  Problem # 2:  TOBACCO USE DISORDER/SMOKER-SMOKING CESSATION DISCUSSED (ICD-305.1) Cessation counseling provided. Pt does not wish to stop smoking at this time. He understands that continued tobacco use will be detrimental in regards to his vascular and cardiovascular disease.   Other Orders: EKG w/ Interpretation (93000) TLB-BMP (Basic Metabolic Panel-BMET) (80048-METABOL) CT Scan  (CT Scan)  Patient Instructions: 1)  Your physician recommends that you schedule a follow-up appointment in: 3-4 weeks 2)  Non-Cardiac CT scanning, (CAT scanning), is a noninvasive, special x-ray that produces cross-sectional images of the body using x-rays and a computer. CT scans help physicians diagnose and treat medical conditions. For some CT exams, a contrast material is used to enhance visibility in the area of the body being studied. CT scans provide greater clarity and reveal more details than regular x-ray exams.

## 2010-05-09 NOTE — Letter (Signed)
Summary: Generic Letter  [facility], Main Office  [address]   [place], [place] [address]   Phone: [phone]  Fax: [phone]        May 25, 2009 MRN: [id number]    [name] [address] [place], [place]  [address]    Attn: [name] (Dr. Ophelia Charter) Fax: [phone]  [name] may hold his aspirin for 5 days prior to planned surgical procedure. If you have any questions, pleas contact our office at [phone].         Sincerely,   Charlies Constable, MD Sherri Rad, RN, BSN  This letter has been electronically signed by your physician.  Appended Document: Generic Letter Faxed to [phone]

## 2010-05-09 NOTE — Letter (Signed)
Summary: [facility] Instructions  [facility]  [address] [place], [place] [address]   Phone: [phone]  Fax: [phone]       [name]    Nov 28, 1940    MRN: [id number]       Procedure Day /Date: 11-22-09     Arrival Time: 8:30 AM      Procedure Time: 9:30 AM     Location of Procedure:                    X     [facility] (4th Floor)    PREPARATION FOR COLONOSCOPY WITH MIRALAX  Starting 5 days prior to your procedure 11-17-09 do not eat nuts, seeds, popcorn, corn, beans, peas,  salads, or any raw vegetables.  Do not take any fiber supplements (e.g. Metamucil, Citrucel, and Benefiber). ____________________________________________________________________________________________________   THE DAY BEFORE YOUR PROCEDURE         DATE: 11-21-09  DAY: Monday  1   Drink clear liquids the entire day-NO SOLID FOOD  2   Do not drink anything colored red or purple.  Avoid juices with pulp.  No orange juice.  3   Drink at least 64 oz. (8 glasses) of fluid/clear liquids during the day to prevent dehydration and help the prep work efficiently.  CLEAR LIQUIDS INCLUDE: Water Jello Ice Popsicles Tea (sugar ok, no milk/cream) Powdered fruit flavored drinks Coffee (sugar ok, no milk/cream) Gatorade Juice: apple, white grape, white cranberry  Lemonade Clear bullion, consomm, broth Carbonated beverages (any kind) Strained chicken noodle soup Hard Candy  4   Mix the entire bottle of Miralax with 64 oz. of Gatorade/Powerade in the morning and put in the refrigerator to chill.  5   At 3:00 pm take 2 Dulcolax/Bisacodyl tablets.  6   At 4:30 pm take one Reglan/Metoclopramide tablet.  7  Starting at 5:00 pm drink one 8 oz glass of the Miralax mixture every 15-20 minutes until you have finished drinking the entire 64 oz.  You should finish drinking prep around 7:30 or 8:00 pm.  8   If you are nauseated, you may take the 2nd Reglan/Metoclopramide tablet  at 6:30 pm.        9    At 8:00 pm take 2 more DULCOLAX/Bisacodyl tablets.     THE DAY OF YOUR PROCEDURE      DATE: 11-22-09  DAY: Tuesday  You may drink clear liquids until 7:30 Am   (2 HOURS BEFORE PROCEDURE).   MEDICATION INSTRUCTIONS  Unless otherwise instructed, you should take regular prescription medications with a small sip of water as early as possible the morning of your procedure.         OTHER INSTRUCTIONS  You will need a responsible adult at least 72 years of age to accompany you and drive you home.   This person must remain in the waiting room during your procedure.  Wear loose fitting clothing that is easily removed.  Leave jewelry and other valuables at home.  However, you may wish to bring a book to read or an iPod/MP3 player to listen to music as you wait for your procedure to start.  Remove all body piercing jewelry and leave at home.  Total time from sign-in until discharge is approximately 2-3 hours.  You should go home directly after your procedure and rest.  You can resume normal activities the day after your procedure.  The day of your procedure you should not:  Drive   Make legal decisions   Operate machinery   Drink alcohol   Return to work  You will receive specific instructions about eating, activities and medications before you leave.   The above instructions have been reviewed and explained to me by   _______________________    I fully understand and can verbalize these instructions _____________________________ Date _______

## 2010-05-11 NOTE — Assessment & Plan Note (Signed)
Summary: 6 month follow up/443.9/pla   Visit Type:  Follow-up Referring Provider:  Tresa Garter MD Primary Provider:  [name] Plotnikov MD  CC:  no cardiac complaints.  History of Present Illness: 70 yo WM with history of CAD s/p CABG, PVD, hyperlipidemia who is here today for  PV follow up. I initially saw him in March 2011 for evaluation of his severe bilateral lower ext pain.  I arranged a CT angiogram which showed disease in the ostium of the right common iliac and serial lesions in the right SFA and  a total occlusion of the left SFA. We proceeded with a distal aortogram with bilateral lower ext runoff. I was able to open his left SFA chronic total occlusion and placed a stent in May 2011. I then staged intervention of his right SFA severe stenosis and treated this with a [place] atherectomy device in June 2011.   He is here today for follow up and is doing well. His legs feel much better. He has back pain but this is unchanged. He has almost completely stopped smoking. No chest pain or SOB. He overall feels well.   Current Medications (verified): 1)  Isosorbide Mononitrate Cr 30 Mg Tb24 (Isosorbide Mononitrate) .Marland Kitchen.. 1 Tab Once Daily 2)  Vytorin 10-20 Mg Tabs (Ezetimibe-Simvastatin) .... One By Mouth Every Other Day 3)  Loratadine 10 Mg  Tabs (Loratadine) .... Once Daily As Needed Allergies 4)  Lorazepam 0.5 Mg Tabs (Lorazepam) .... One By Mouth At Bedtime 5)  Metoprolol Succinate 50 Mg Xr24h-Tab (Metoprolol Succinate) .... 1/2 Tablet By Mouth Daily 6)  Norvasc 5 Mg Tabs (Amlodipine Besylate) .... One By Mouth Daily 7)  Omeprazole 20 Mg Cpdr (Omeprazole) .... One By Mouth Daily 8)  Multivitamins  Tabs (Multiple Vitamin) .... Once Daily 9)  Vitamin D3 1000 Unit Caps (Cholecalciferol) .Marland Kitchen.. 1 By Mouth Daily 10)  Tramadol Hcl 50 Mg Tabs (Tramadol Hcl) .Marland Kitchen.. 1 By Mouth As Needed 11)  Aspirin Ec 325 Mg Tbec (Aspirin) .... Take One Tablet By Mouth Daily 12)  Bentyl 10 Mg Caps  (Dicyclomine Hcl) .... Take 1 Tab 2-3 Times Daily As Needed For Abd Pain and Spasms 13)  Align  Caps (Probiotic Product) .... Take 1 Cap Daily For 2 Months 14)  Hydrocortisone Acetate 25 Mg Supp (Hydrocortisone Acetate) .... At Bedtime As Needed For Rectal Bleeding  Allergies: 1)  ! Codeine Sulfate (Codeine Sulfate)  Past History:  Past Medical History: Current Problems:  CORONARY ARTERY DISEASE (ICD-414.00)- S/P LIMA-OM, SVG-PDA 1993 CATH 1999 LIMA ATROPHIC, SVG OK TRANSIENT ISCHEMIC ATTACK, HX OF (ICD-V12.50) HYPERLIPIDEMIA (ICD-272.4) PERIPHERAL NEUROPATHY (ICD-356.9) PVD WITH CLAUDICATION (ICD-443.89)- ANGIOPLASTY RIGHT ILIAC 8/92 PTA stent left SFA 5/11.  S/P PTA/STENT LEFT FEMORAL ARTERY 5/11,PTA TO RIGHT FEMORAL ARTERY 6/11-atherectomy Bilateral carotid bruits. PREOPERATIVE EXAMINATION (ICD-V72.84) BRONCHITIS, ACUTE (ICD-466.0) VENTRAL HERNIA (ICD-553.20) PROSTATE CANCER, HX OF (ICD-V10.46) s/p prostatectomy and XRT 2010 DIVERTICULOSIS  ALLERGIC RHINITIS (ICD-477.9)  OSTEOARTHRITIS (ICD-715.90) LOW BACK PAIN (ICD-724.2) COPD (ICD-496) TOBACCO USE DISORDER/SMOKER-SMOKING CESSATION DISCUSSED (ICD-305.1)  Social History: Reviewed history from 06/27/2009 and no changes required. Retired-Self employed. Married, 3 living children Current Smoker 2 cigs per day. Former 1ppd for 40 years. No alcohol use No illicit drug use  Review of Systems       The patient complains of joint pain.  The patient denies fatigue, malaise, fever, weight gain/loss, vision loss, decreased hearing, hoarseness, chest pain, palpitations, shortness of breath, prolonged cough, wheezing, sleep apnea, coughing up blood, abdominal pain, blood in stool,  nausea, vomiting, diarrhea, heartburn, incontinence, blood in urine, muscle weakness, leg swelling, rash, skin lesions, headache, fainting, dizziness, depression, anxiety, enlarged lymph nodes, easy bruising or bleeding, and environmental allergies.     Vital Signs:  Patient profile:   70 year old male Height:      71 inches Weight:      168.06 pounds BMI:     23.52 Pulse rate:   68 / minute Pulse rhythm:   regular Resp:     18 per minute BP sitting:   122 / 58  (left arm) Cuff size:   large  Vitals Entered By: Haze Boyden, CMA (March 22, 2010 12:00 PM)  Physical Exam  General:  General: Well developed, well nourished, NAD HEENT: OP clear, mucus membranes moist Musculoskeletal: Muscle strength 5/5 all ext Psychiatric: Mood and affect normal Neck: No JVD, no carotid bruits, no thyromegaly, no lymphadenopathy. Lungs:Clear bilaterally, no wheezes, rhonci, crackles CV: RRR no murmurs, gallops rubs Abdomen: soft, NT, ND, BS present Extremities: No edema, pulses 1-2+ bilateral PT.     Impression & Recommendations:  Problem # 1:  PVD WITH CLAUDICATION (ICD-443.89) He is doing well post intervention on both superficial femoral arteries. He is due for ABIs today. I congratulated him on stopping smoking. Continue current therapy. He will keep walking on a daily basis. Repeat ABI 6 months.   Problem # 2:  CORONARY ARTERY DISEASE (ICD-414.00) Stable. No changes.   His updated medication list for this problem includes:    Isosorbide Mononitrate Cr 30 Mg Tb24 (Isosorbide mononitrate) .Marland Kitchen... 1 tab once daily    Metoprolol Succinate 50 Mg Xr24h-tab (Metoprolol succinate) .Marland Kitchen... 1/2 tablet by mouth daily    Norvasc 5 Mg Tabs (Amlodipine besylate) ..... One by mouth daily    Aspirin Ec 325 Mg Tbec (Aspirin) .Marland Kitchen... Take one tablet by mouth daily  Patient Instructions: 1)  Your physician recommends that you schedule a follow-up appointment in: 6 months 2)  Your physician recommends that you continue on your current medications as directed. Please refer to the Current Medication list given to you today. 3)  Your physician has requested that you have an ankle brachial index (ABI) in 6 months. During this test an ultrasound and blood  pressure cuff are used to evaluate the arteries that supply the arms and legs with blood. Allow thirty minutes for this exam. There are no restrictions or special instructions.

## 2010-05-11 NOTE — Miscellaneous (Signed)
Summary: Orders Update  Clinical Lists Changes  Orders: Added new Test order of Arterial Duplex Lower Extremity (Arterial Duplex Low) - Signed 

## 2010-05-12 NOTE — Consult Note (Signed)
Summary: [facility]  [facility]   Imported By: Lester Penelope 04/20/2009 10:16:24  _____________________________________________________________________  External Attachment:    Type:   Image     Comment:   External Document

## 2010-06-07 ENCOUNTER — Encounter: Payer: Self-pay | Admitting: Internal Medicine

## 2010-06-19 ENCOUNTER — Other Ambulatory Visit: Payer: Self-pay

## 2010-06-20 NOTE — Letter (Signed)
Summary: [facility]  [facility]   Imported By: Sherian Rein 06/12/2010 13:07:32  _____________________________________________________________________  External Attachment:    Type:   Image     Comment:   External Document

## 2010-06-26 ENCOUNTER — Encounter: Payer: Self-pay | Admitting: Internal Medicine

## 2010-06-26 ENCOUNTER — Ambulatory Visit (INDEPENDENT_AMBULATORY_CARE_PROVIDER_SITE_OTHER): Payer: Medicare Other | Admitting: Internal Medicine

## 2010-06-26 DIAGNOSIS — R21 Rash and other nonspecific skin eruption: Secondary | ICD-10-CM

## 2010-06-26 DIAGNOSIS — R61 Generalized hyperhidrosis: Secondary | ICD-10-CM

## 2010-06-26 DIAGNOSIS — E785 Hyperlipidemia, unspecified: Secondary | ICD-10-CM

## 2010-06-26 DIAGNOSIS — M1990 Unspecified osteoarthritis, unspecified site: Secondary | ICD-10-CM

## 2010-06-27 LAB — BASIC METABOLIC PANEL
BUN: 12 mg/dL (ref 6–23)
Calcium: 9.2 mg/dL (ref 8.4–10.5)
Creatinine, Ser: 0.9 mg/dL (ref 0.4–1.5)
GFR calc non Af Amer: >60 mL/min (ref >60)
Glucose, Bld: 103 mg/dL — ABNORMAL HIGH (ref 70–99)

## 2010-06-27 LAB — CBC
Platelets: 177 10*3/uL (ref 150–400)
RDW: 12.4 % (ref 11.5–15.5)

## 2010-07-06 NOTE — Assessment & Plan Note (Signed)
Summary: 4 MOV/ NWS #   Vital Signs:  Patient profile:   70 year old male Height:      71 inches Weight:      168 pounds BMI:     23.52 Temp:     98.3 degrees F oral Pulse rate:   72 / minute Pulse rhythm:   regular Resp:     16 per minute BP sitting:   110 / 48  (right arm) Cuff size:   regular  Vitals Entered By: Lanier Prude, CMA(AAMA) (June 26, 2010 10:13 AM) CC: 4 mo f/u Is Patient Diabetic? No Comments pt is not taking Bentyl.Marland KitchenMarland Kitchenplease remove from list.  Pt is fasting today   Primary Care Provider:  Tresa Garter MD  CC:  4 mo f/u.  History of Present Illness: The patient presents for a follow up of hypertension, PVD, hyperlipidemia C/o night sweats x 6 months    Current Medications (verified): 1)  Isosorbide Mononitrate Cr 30 Mg Tb24 (Isosorbide Mononitrate) .Marland Kitchen.. 1 Tab Once Daily 2)  Vytorin 10-20 Mg Tabs (Ezetimibe-Simvastatin) .... One By Mouth Every Other Day 3)  Loratadine 10 Mg  Tabs (Loratadine) .... Once Daily As Needed Allergies 4)  Lorazepam 0.5 Mg Tabs (Lorazepam) .... One By Mouth At Bedtime 5)  Metoprolol Succinate 50 Mg Xr24h-Tab (Metoprolol Succinate) .... 1/2 Tablet By Mouth Daily 6)  Norvasc 5 Mg Tabs (Amlodipine Besylate) .... One By Mouth Daily 7)  Omeprazole 20 Mg Cpdr (Omeprazole) .... One By Mouth Daily 8)  Multivitamins  Tabs (Multiple Vitamin) .... Once Daily 9)  Vitamin D3 1000 Unit Caps (Cholecalciferol) .Marland Kitchen.. 1 By Mouth Daily 10)  Tramadol Hcl 50 Mg Tabs (Tramadol Hcl) .Marland Kitchen.. 1 By Mouth As Needed 11)  Aspirin Ec 325 Mg Tbec (Aspirin) .... Take One Tablet By Mouth Daily 12)  Bentyl 10 Mg Caps (Dicyclomine Hcl) .... Take 1 Tab 2-3 Times Daily As Needed For Abd Pain and Spasms  Allergies (verified): 1)  ! Codeine Sulfate (Codeine Sulfate)  Past History:  Past Medical History: Last updated: 03/22/2010 Current Problems:  CORONARY ARTERY DISEASE (ICD-414.00)- S/P LIMA-OM, SVG-PDA 1993 CATH 1999 LIMA ATROPHIC, SVG OK TRANSIENT  ISCHEMIC ATTACK, HX OF (ICD-V12.50) HYPERLIPIDEMIA (ICD-272.4) PERIPHERAL NEUROPATHY (ICD-356.9) PVD WITH CLAUDICATION (ICD-443.89)- ANGIOPLASTY RIGHT ILIAC 8/92 PTA stent left SFA 5/11.  S/P PTA/STENT LEFT FEMORAL ARTERY 5/11,PTA TO RIGHT FEMORAL ARTERY 6/11-atherectomy Bilateral carotid bruits. PREOPERATIVE EXAMINATION (ICD-V72.84) BRONCHITIS, ACUTE (ICD-466.0) VENTRAL HERNIA (ICD-553.20) PROSTATE CANCER, HX OF (ICD-V10.46) s/p prostatectomy and XRT 2010 DIVERTICULOSIS  ALLERGIC RHINITIS (ICD-477.9)  OSTEOARTHRITIS (ICD-715.90) LOW BACK PAIN (ICD-724.2) COPD (ICD-496) TOBACCO USE DISORDER/SMOKER-SMOKING CESSATION DISCUSSED (ICD-305.1)  Social History: Last updated: 03/22/2010 Retired-Self employed. Married, 3 living children Current Smoker 2 cigs per day. Former 1ppd for 40 years. No alcohol use No illicit drug use  Review of Systems  The patient denies anorexia, fever, weight loss, weight gain, chest pain, and abdominal pain.    Physical Exam  General:  Well developed, well nourished, in no acute distress. Smells of cigarette smoke Head:  Normocephalic and atraumatic without obvious abnormalities. No apparent alopecia or balding. Mouth:  Teeth, gums and palate normal. Oral mucosa normal. Neck:  Neck supple, no JVD. No masses, thyromegaly or abnormal cervical nodes.Loud bilateral carotid bruits. Lungs:  decreased BS  bilateral  Heart:  Grade  /6 DM loudest at primary aortic area and S4.   Abdomen:  Bowel sounds positive; abdomen soft and non-tender without masses, organomegaly, 7 cm L ventral NT hernia noted. No hepatosplenomegaly.  Msk:  LS with decreased ROM.  Uses a cane B gluts and piriformis muscles are tight Neurologic:  Alert and oriented x 3. No pronator drift. CN 2-12 nonfocal Skin:  dry patches of rash  on trunk and small 1 mm papules Psych:  Oriented X3.     Impression & Recommendations:  Problem # 1:  SWEATING (ICD-780.8)  likely due to low  testost Assessment New Options to treat discussed  Problem # 2:  SKIN RASH (ICD-782.1) ? etiol Assessment: New Skin biopsy if needed  Problem # 3:  OSTEOARTHRITIS (ICD-715.9) Assessment: Unchanged  His updated medication list for this problem includes:    Tramadol Hcl 50 Mg Tabs (Tramadol hcl) .Marland Kitchen... 1 by mouth as needed    Aspirin Ec 325 Mg Tbec (Aspirin) .Marland Kitchen... Take one tablet by mouth daily  Problem # 4:  CORONARY ARTERY DISEASE (ICD-414.00) Assessment: Unchanged  His updated medication list for this problem includes:    Isosorbide Mononitrate Cr 30 Mg Tb24 (Isosorbide mononitrate) .Marland Kitchen... 1 tab once daily    Metoprolol Succinate 50 Mg Xr24h-tab (Metoprolol succinate) .Marland Kitchen... 1/2 tablet by mouth daily    Norvasc 5 Mg Tabs (Amlodipine besylate) ..... One by mouth daily    Aspirin Ec 325 Mg Tbec (Aspirin) .Marland Kitchen... Take one tablet by mouth daily  Problem # 5:  HYPERLIPIDEMIA (ICD-272.4) Assessment: Improved  His updated medication list for this problem includes:    Vytorin 10-20 Mg Tabs (Ezetimibe-simvastatin) ..... One by mouth every other day  Complete Medication List: 1)  Isosorbide Mononitrate Cr 30 Mg Tb24 (Isosorbide mononitrate) .Marland Kitchen.. 1 tab once daily 2)  Vytorin 10-20 Mg Tabs (Ezetimibe-simvastatin) .... One by mouth every other day 3)  Loratadine 10 Mg Tabs (Loratadine) .... Once daily as needed allergies 4)  Lorazepam 0.5 Mg Tabs (Lorazepam) .... One by mouth at bedtime 5)  Metoprolol Succinate 50 Mg Xr24h-tab (Metoprolol succinate) .... 1/2 tablet by mouth daily 6)  Norvasc 5 Mg Tabs (Amlodipine besylate) .... One by mouth daily 7)  Omeprazole 20 Mg Cpdr (Omeprazole) .... One by mouth daily 8)  Multivitamins Tabs (Multiple vitamin) .... Once daily 9)  Vitamin D3 1000 Unit Caps (Cholecalciferol) .Marland Kitchen.. 1 by mouth daily 10)  Tramadol Hcl 50 Mg Tabs (Tramadol hcl) .Marland Kitchen.. 1 by mouth as needed 11)  Aspirin Ec 325 Mg Tbec (Aspirin) .... Take one tablet by mouth daily 12)  Bentyl 10  Mg Caps (Dicyclomine hcl) .... Take 1 tab 2-3 times daily as needed for abd pain and spasms  Patient Instructions: 1)  Please schedule a follow-up appointment in 3 months. 2)  BMP prior to visit, ICD-9: 3)  Hepatic Panel prior to visit, ICD-9:272.0 995.20 4)  Lipid Panel prior to visit, ICD-9: 5)  CBC w/ Diff prior to visit, ICD-9: Prescriptions: BENTYL 10 MG CAPS (DICYCLOMINE HCL) Take 1 tab 2-3 times daily as needed for abd pain and spasms  #90 x 1   Entered and Authorized by:   Tresa Garter MD   Signed by:   Tresa Garter MD on 06/26/2010   Method used:   Print then Give to Patient   RxID:   [id number]    Orders Added: 1)  Est. Patient Level IV [65784]

## 2010-07-25 LAB — HEMOGLOBIN AND HEMATOCRIT, BLOOD
HCT: 35.5 % — ABNORMAL LOW (ref 39.0–52.0)
Hemoglobin: 12.2 g/dL — ABNORMAL LOW (ref 13.0–17.0)
Hemoglobin: 12.2 g/dL — ABNORMAL LOW (ref 13.0–17.0)
Hemoglobin: 13 g/dL (ref 13.0–17.0)

## 2010-07-25 LAB — CBC
HCT: 41.8 % (ref 39.0–52.0)
Hemoglobin: 14.2 g/dL (ref 13.0–17.0)
Platelets: 223 10*3/uL (ref 150–400)
WBC: 8.5 10*3/uL (ref 4.0–10.5)

## 2010-07-25 LAB — BASIC METABOLIC PANEL
BUN: 9 mg/dL (ref 6–23)
GFR calc non Af Amer: >60 mL/min (ref >60)
Potassium: 5.1 mEq/L (ref 3.5–5.1)
Sodium: 138 mEq/L (ref 135–145)

## 2010-07-25 LAB — TYPE AND SCREEN: Antibody Screen: NEGATIVE

## 2010-08-02 ENCOUNTER — Telehealth: Payer: Self-pay | Admitting: Cardiovascular Disease

## 2010-08-02 NOTE — Telephone Encounter (Signed)
Prescription for Norvasc refilled.

## 2010-08-02 NOTE — Telephone Encounter (Signed)
Pt called upset that pres that was faxed on 4-17 had not been sent back to [facility] on [address]. Pt requested to speak of office manager.

## 2010-08-03 ENCOUNTER — Other Ambulatory Visit: Payer: Self-pay | Admitting: *Deleted

## 2010-08-03 MED ORDER — AMLODIPINE BESYLATE 5 MG PO TABS: 5.0000 mg | ORAL_TABLET | Freq: Every day | ORAL | Status: DC

## 2010-08-22 NOTE — Discharge Summary (Signed)
NAMEJOSMAR, MESSIMER          ACCOUNT NO.:  [id number]   MEDICAL RECORD NO.:  [id number]          PATIENT TYPE:  INP   LOCATION:  1437                         FACILITY:  [facility]   PHYSICIAN:  Heloise Purpura, MD      DATE OF BIRTH:  1940-05-08   DATE OF ADMISSION:  06/03/2008  DATE OF DISCHARGE:  06/05/2008                               DISCHARGE SUMMARY   ADMISSION DIAGNOSIS:  Clinically localized adenocarcinoma of the  prostate.   DISCHARGE DIAGNOSIS:  Clinically localized adenocarcinoma of the  prostate.   PROCEDURE:  1. Robotic assisted laparoscopic radical prostatectomy.  2. Bilateral pelvic lymphadenectomy.   HISTORY AND PHYSICAL:  For full details please see admission history and  physical.  Briefly, Mr. Oberhaus is a 70 year old gentleman with  clinically localized adenocarcinoma of the prostate.  After discussion  regarding management options for treatment, he elected to proceed with  surgical therapy and the above procedures.  On June 05, 2008, he was  taken to the operating room and underwent the above procedures.  He  tolerated these procedures well and without complications.  Postoperatively he was able to be transferred to a regular hospital  room.  He remained hemodynamically stable overnight and his hematocrit  remained stable at 35.7.  However, on the evening of surgery when he did  stand up to ambulate he did have a large amount of bloody drainage from  his periumbilical wound site.  This was reinforced and he was examined  on the morning of postoperative day 1.  He had no evidence of bleeding  with a stable hematocrit and remained totally hemodynamically stable.  He had minimal output from his drain with excellent urine output  indicating that there was no evidence for any bleeding.  It was felt  that this was likely related to a small subcutaneous hematoma.  His  wound was carefully examined.  There was no evidence for wound  dehiscence.  He began  ambulating further that day and had no further  issues with his wound.  He began a clear liquid diet and had significant  nausea on the morning of postoperative day 1.  He also had some  difficulty ambulating during that day.  He was reexamined on the  afternoon of postoperative day 1 and was improved.  However, he was not  ambulating extremely well and based on the fact that he had had  significant drainage from his wound the night before it was decided to  continue close observation.  He was observed overnight and continued to  ambulate.  He began ambulating better and was tolerating his liquid diet  without difficulty.  He was therefore felt to be stable for discharge  home on postoperative day 2.  In addition, his pelvic drain was removed  on the evening of postoperative day 1 as it continued to have minimal  drainage.   DISPOSITION:  Home.   DISCHARGE MEDICATIONS:  He was instructed to resume his regular home  medications excepting any aspirin, nonsteroidal anti-inflammatory drugs  or herbal supplements.  He was provided a prescription to take Darvocet  as needed for pain  and told to utilize Colace as a stool softener.  In  addition, he was provided an antibiotic (ciprofloxacin) to begin taking  1 day prior to his return visit for catheter removal.   DISCHARGE INSTRUCTIONS:  He was instructed to be ambulatory but  specifically told to refrain from any heavy lifting, strenuous activity  or driving.  He was instructed on routine Foley catheter care and  provided a leg bag for daytime usage.  He was instructed to gradually  advance his diet once passing flatus adequately.   FOLLOWUP:  Mr. Dayhoff will follow up next week for Foley catheter  removal and to discuss his surgical pathology results.      Heloise Purpura, MD  Electronically Signed     LB/MEDQ  D:  06/05/2008  T:  06/05/2008  Job:  [id number]

## 2010-08-22 NOTE — Assessment & Plan Note (Signed)
[facility]                            CARDIOLOGY OFFICE NOTE   JOSEF, TOURIGNY                 MRN:          [id number]  DATE:11/27/2006                            DOB:          Oct 28, 1940    REFERRING PHYSICIAN:  [name]. Plotnikov, MD   CLINICAL HISTORY:  Mr. Mendez is 70 years old, and had bypass  surgery in 1993, at which time he had a LIMA graft to the marginal  branch of the circumflex, and a vein graft to the posterolateral branch  of the circumflex.  Subsequently, his LIMA graft became atrophic, but  his vein graft has supplied most of his heart.  He had a Myoview scan  done last year, which showed no significant ischemia.   He says he has been doing well from the standpoint of his heart with no  chest pain, shortness of breath, or palpitations.  He is limited  primarily by his severe lumbar disk disease.   PAST MEDICAL HISTORY:  Significant for:  1. Degenerative disease of the lumbar spine, status post 2 previous      operations.  2. He also has hyperlipidemia.   CURRENT MEDICATIONS:  Include lorazepam, Imdur, aspirin, metoprolol,  Norvasc, Prilosec, Proscar, and Vytorin.   EXAMINATION:  Blood pressure is 103/58.  Pulse 59 and regular.  There was no venous distention.  The carotid pulses were full without  bruits.  CHEST:  Was clear.  CARDIAC:  Rhythm was regular.  I could hear no murmurs or gallops.  ABDOMEN:  Soft without organomegaly.  Peripheral pulses were full with no peripheral edema.   Electrocardiogram showed left axis deviation.   IMPRESSION:  1. Coronary artery disease status post coronary artery bypass graft      surgery in 1993 with coronary anatomy as described above.  2. Good left ventricular function.  3. Degenerative disease of the lumbar spine.  4. Hyperlipidemia.   RECOMMENDATIONS:  I think Mr. Loughmiller is doing well.  He has lost  quite a bit of weight, and his last lipid profile was good.   His blood  pressure is under good control.  He asked about  lifting weights, and I encouraged him to do that as long as he increases  his weight gradually.  We will continue with same medications, and we  will plan to see him back in followup in 1 year.     Bruce Elvera Lennox Juanda Chance, MD, [facility]  Electronically Signed    BRB/MedQ  DD: 11/27/2006  DT: 11/28/2006  Job #: [id number]

## 2010-08-22 NOTE — Assessment & Plan Note (Signed)
[facility]                            CARDIOLOGY OFFICE NOTE   [name], [name]                 MRN:          [id number]  DATE:11/11/2007                            DOB:          1941/01/31    PRIMARY CARE PHYSICIAN:  [name]. Plotnikov, MD   CLINICAL HISTORY:  [name] is 70 years old and returns for  followup management of his coronary heart disease.  He had bypass  surgery in 1993 when he had a mammary artery placed to the marginal  branch of his circumflex artery and vein graft to the posterolateral.  The mammary became atrophic and the vein graft to posterolateral branch  supplied most of his heart at last study.  His last Myoview scan was in  2007.   He has been doing well and has had no recent chest pain, shortness of  breath, or palpitations.   PAST MEDICAL HISTORY:  Significant for degenerative disease of the  lumbar spine and he has had previous surgery for this and also has  hyperlipidemia with low HDL.   CURRENT MEDICATIONS:  1. Lorazepam.  2. Imdur.  3. Aspirin.  4. Metoprolol.  5. Norvasc.  6. Prilosec.  7. Vytorin.  8. Finasteride.  9. Vitamins.   PHYSICAL EXAMINATION:  VITAL SIGNS:  The blood pressure was 105/57, the  pulse 54 and regular.  NECK:  There was no venous distention.  The carotid pulses were full  without bruits.  CHEST:  Clear.  HEART:  Rhythm is regular.  I can hear no murmurs or gallops.  ABDOMEN:  Soft with normal bowel sounds.  EXTREMITIES:  Peripheral pulses are full with no peripheral edema.   An electrocardiogram showed left axis deviation.   IMPRESSION:  1. Coronary artery disease status post coronary artery bypass graft      surgery in 1993 with coronary anatomy as described above, now      stable.  2. Good left ventricular function with an ejection fraction of 66%.  3. Degenerative disease of lumbar spine.  4. Hyperlipidemia.   RECOMMENDATIONS:  I think, [name] is  doing well.  We did not  change his therapy.  We will plan to see him back in followup in a year.     Bruce Elvera Lennox Juanda Chance, MD, [facility]  Electronically Signed    BRB/MedQ  DD: 11/11/2007  DT: 11/12/2007  Job #: [id number]

## 2010-08-22 NOTE — Procedures (Signed)
   [name], [name]          ACCOUNT NO.:  [id number]   MEDICAL RECORD NO.:  [id number]          PATIENT TYPE:  AMB   LOCATION:  SDS                          FACILITY:  [facility]   PHYSICIAN:  Verne Carrow, MDDATE OF BIRTH:  [birth year]   DATE OF PROCEDURE:  09/14/2009  DATE OF DISCHARGE:                    PERIPHERAL VASCULAR INVASIVE PROCEDURE   PRIMARY CARE PHYSICIAN:  [name]. Plotnikov, MD   PROCEDURES:  1. Selective angiogram of the right superficial femoral artery  2. Atherectomy of the right superficial femoral artery of the      turbohawk atherectomy device.   OPERATOR:  Verne Carrow, MD   INDICATIONS:  Known peripheral vascular disease with claudication.  The  patient was here several weeks ago for diagnostic distal aortogram with  bilateral lower extremity runoff.  He was found to have a total  occlusion of a left superficial femoral artery in the distal portion.  We were able to open this vessel and place the stent.  The ABI was  normal in the left leg at last check last week.  The patient was also  found to have severe stenosis of the proximal portion of the right  superficial femoral artery during the initial angiogram.  We brought him  back today for a staged intervention.   DETAILS OF PROCEDURE:  The patient was brought to the peripheral  vascular laboratory after signing informed consent for the procedure.  The left groin was prepped and draped in a sterile fashion with 1%  lidocaine used for local anesthesia.  A 7 foot sheath was inserted into  the left femoral artery without difficulty.  The sheath was brought  around into the right common femoral artery without difficulty.  We then  gave the patient 5000 units of intravenous heparin.  When the ACT was  greater than 200, I passed a Cougar intracoronary wire down the length  of the right superficial femoral artery into the popliteal artery.  A  TurboHawk arthrectomy device was then passed  multiple times to the area  of severe stenosis.  There was excellent angiography result.  The  stenosis was taken from 95% down to 10%.  The patient tolerated the  procedure well.  He was taken to the holding area in stable condition.   IMPRESSION:  Successful atherectomy in the right superficial femoral  artery in the proximal portion with three vessel runoff to the right  foot and no immediate complications.   RECOMMENDATIONS:  We will continue the patient's current medical therapy  which includes aspirin and Plavix.  We will once again encourage  complete tobacco cessation.      Verne Carrow, MD     CM/MEDQ  D:  09/14/2009  T:  09/14/2009  Job:  [id number]   cc:   [name]. Plotnikov, MD   Electronically Signed by Verne Carrow MD on 09/15/2009 04:15:46 PM

## 2010-08-22 NOTE — Op Note (Signed)
NAMEELENA, [name]          ACCOUNT NO.:  [id number]   MEDICAL RECORD NO.:  [id number]          PATIENT TYPE:  INP   LOCATION:  1437                         FACILITY:  [facility]   PHYSICIAN:  Heloise Purpura, MD      DATE OF BIRTH:  [birth year]   DATE OF PROCEDURE:  06/03/2008  DATE OF DISCHARGE:                               OPERATIVE REPORT   PREOPERATIVE DIAGNOSIS:  Clinically localized adenocarcinoma of the  prostate.  (Clinical stage T1C N0 M0).   POSTOPERATIVE DIAGNOSIS:  Clinically localized adenocarcinoma of the  prostate.  (Clinical stage T1C N0 M0).   PROCEDURES:  1. Robotic-assisted laparoscopic radical prostatectomy (partial right-      nerve sparing).  2. Extended bilateral pelvic lymphadenectomy.   SURGEON:  Dr. Heloise Purpura.   ASSISTANT:  Delia Chimes   ANESTHESIA:  General.   COMPLICATIONS:  None.   ESTIMATED BLOOD LOSS:  150 mL.   INTRAVENOUS FLUIDS:  1500 mL of lactated Ringer's.   SPECIMENS:  1. Prostate and seminal vesicles.  2. Right pelvic lymph nodes.  3. Left pelvic lymph nodes.   DISPOSITION OF SPECIMENS:  To pathology.   DRAINS:  1. 20-French coude catheter.  2. #19 Blake pelvic drain.   INDICATION:  Mr. [name] is a 70 year old gentleman who was diagnosed  with high-risk clinically localized adenocarcinoma of the prostate.  After a discussion regarding management options for treatment, he  elected to proceed with surgical therapy and the above procedures.  The  potential risks, complications, and alternative treatment options were  discussed in detail and informed consent was obtained.   DESCRIPTION OF PROCEDURE:  The patient was taken to the operating room  and a general anesthetic was administered.  He was given preoperative  antibiotics, placed in the dorsal lithotomy position, and prepped and  draped in the usual sterile fashion.  Next a preoperative time-out was  performed.   A site was then selected, just the left of the  umbilicus, for placement  of the camera port.  This was placed using a standard open Hasson  technique, which allowed entry into the peritoneal cavity under direct  vision and without difficulty.  A 12-mm port was then placed and a  pneumoperitoneum established.  The 0-degree lens was used to inspect the  abdomen and there was no evidence for any intra-abdominal injuries or  other abnormalities.  The remaining ports were then placed.  Bilateral 8  mm robotic ports were placed 10 cm lateral to and just inferior to the  camera port site.  An additional 8-mm robotic port was placed in the far  left lateral abdominal wall.  A 5-mm port was placed between the camera  port and the right robotic port.  A 12-mm port was placed in the far  right lateral abdominal wall for laparoscopic assistance.  All ports  were placed under direct vision and without difficulty.   The surgical cart was then docked.  With the aid of the cautery  scissors, the bladder was reflected posteriorly allowing entry into the  space of Retzius and identification of the endopelvic fascia and  prostate.  The endopelvic fascia was then incised from the apex back to  the base of the prostate bilaterally and the underlying levator muscle  fibers were swept laterally off the prostate, thereby isolating the  dorsal venous complex.  The dorsal vein was then stapled and divided  with a 45-mm Flex ETS stapler.  The bladder neck was identified with the  aid of Foley catheter manipulation and was divided anteriorly, thereby  exposing the Foley catheter.  The catheter balloon was deflated and the  catheter was brought into the operative field and used to retract the  prostate anteriorly.  Posterior bladder neck was then divided and  dissection proceeded between the bladder and prostate until the vasa  efferentia and seminal vesicles were identified.  The vasa efferentia  were isolated, divided and lifted anteriorly.  The seminal  vesicles were  then dissected down to their tips with care to control the seminal  vesicle arterial blood supply.  The seminal vesicles were then lifted  anteriorly and the space between Denonvilliers' fascia and the anterior  rectum was bluntly developed, thereby isolating the vascular pedicles of  the prostate.   On the right side, the levator fascia was incised, allowing the  neurovascular bundles to be released.  Hem-o-lok clips were then used to  ligate the vascular pedicle on the right side with a small amount of  periprosthetic fat left on the prostate, considering his high-risk  disease.  On the left side, a wide non-nerve-sparing procedure was  performed with Hem-o-lok clips used to control the vascular pedicles of  the prostate.  The urethra was then sharply transected, allowing the  prostate specimen to be disarticulated.  The pelvis was copiously  irrigated and hemostasis was ensured.  There was no evidence for a  rectal injury.   Attention then turned to the right pelvic sidewall.  The fibrofatty  tissue between the external iliac vein, confluence of the iliac vessels,  hypogastric artery, and Cooper's ligament was dissected free from the  pelvic sidewall with care to preserve the obturator nerve.  Hem-o-lok  clips were used for lymphostasis and hemostasis.  An identical procedure  was then performed on the contralateral side.  Both lymphatic packets  were sent for permanent pathologic analysis.   Attention then turned to the urethral anastomosis.  A 2-0 Vicryl slip-  knot was placed between Denonvilliers' fascia, the posterior bladder  neck, and the posterior urethra to reapproximate these structures.  A  double-armed 3-0 Monocryl suture was then used to perform a 360-degree  running tension-free anastomosis between the bladder neck and urethra.  A new 20-French coude catheter was inserted into the bladder and  irrigated.  There were no blood clots within the bladder and  the  anastomosis appeared be watertight.  A #19 Blake drain was then brought  to the left robotic port and positioned in the pelvis appropriately.  It  was secured to the skin with a nylon suture.   The surgical cart was then undocked.  The right lateral 12-mm port site  was closed with a 0 Vicryl suture, placed with the aid of the suture  passer device.  All remaining ports were then removed under direct  vision.  The prostate specimen was removed intact within the Endopouch  retrieval bag via the periumbilical port site.  This fascial opening was  then closed with a running 0 Vicryl suture.  All port sites were  injected with 0.25%Marcaine and  reapproximated at the skin level  with staples.  Sterile dressings were  applied.  The patient appeared to tolerate the procedure well and  without complications.  He was able to be extubated and transferred to  the recovery unit in satisfactory condition.      Heloise Purpura, MD  Electronically Signed     LB/MEDQ  D:  06/03/2008  T:  06/04/2008  Job:  [id number]

## 2010-08-22 NOTE — Procedures (Signed)
[name], BEAGLE          ACCOUNT NO.:  [id number]   MEDICAL RECORD NO.:  [id number]          PATIENT TYPE:  INP   LOCATION:  6532                         FACILITY:  [facility]   PHYSICIAN:  Verne Carrow, MDDATE OF BIRTH:  1940/12/01   DATE OF PROCEDURE:  08/17/2009  DATE OF DISCHARGE:                    PERIPHERAL VASCULAR INVASIVE PROCEDURE   PRIMARY CARE PHYSICIAN:  [name]. Plotnikov, M.D.   PROCEDURE PERFORMED:  1. Distal aortogram with bilateral lower extremity runoff.  2. PTA with placement of a stent in the total occlusion of the left      superficial femoral artery.   SURGEON:  Verne Carrow, M.D.   INDICATIONS FOR PROCEDURE:  This is a 70 year old Caucasian male with a  history of coronary artery disease, status post CABG, peripheral  vascular disease, hyperlipidemia, and tobacco abuse, who has had  complaints of lower extremity claudication.  Noninvasive studies  suggested bilateral lower extremity disease.  A CT angiogram suggested  severe disease in the right superficial femoral artery, moderate disease  in the right iliac artery, and total occlusion of the left superficial  femoral artery.  The patient was referred today for a diagnostic lower  extremity angiogram.   DESCRIPTION OF PROCEDURE:  The patient was brought to the main  peripheral vascular laboratory after signing informed consent for the  procedure.  The right groin was prepped and draped in sterile fashion.  Then 1% Lidocaine was used for local anesthesia.  A 5 French sheath was  inserted into the right femoral artery without difficulty.  A pigtail  catheter was advanced into the distal aorta just above the takeoff of  the bilateral renal arteries.  We performed the angiograms of the distal  aorta starting above the renal arteries.  Pigtail catheter was then  pulled down to the level of the bifurcation and several oblique  angiograms were taken of the pelvic vessels.  We then  performed a  bilateral lower extremity runoff starting at the level of the aortic  bifurcation into the iliac arteries.  At this point in the procedure, we  elected to proceed to intervention of the total occlusion in the left  superficial femoral artery.  A 7 French Terumo sheath was advanced over  a wooly wire into the right common iliac artery.  Crossover catheter was  used to engage the left iliac system and the wire was advanced down to  the common femoral artery.  We then were able to advance the sheath over  the aortic bifurcation and positioned it into the left common femoral  artery.  At this point in the procedure we began attempting to cross the  chronic total occlusion in the left superficial femoral artery.  The  patient was given 5000 units of intravenous heparin.  ACTs were followed  during the case and were kept above 250.  We initially tried to cross  the lesion with an end-hole catheter with a glide wire.  We were unable  to find a subintimal plane.  I then used a frontrunner catheter to  attempt to open the proximal catheter of the total occlusion.  At this  point an end-hole catheter  was advanced down to the occlusion and a  glide wire was advanced into the subintimal plane.  We were able to gain  access into the popliteal artery by reentering into the luminal space.  A 4.0 x 16 mm balloon was inflated in the area of total occlusion.  We  then placed a 5.0 x 150 mm Everflex stent in the area of chronic total  occlusion.  A 5.0 x 120 mm Fox cross balloon was inflated inside the  stent.  There was an excellent angiograph result with good flow through  all three vessels to the left.  The patient tolerated the procedure  well.  He was taken to the holding area in stable condition.   HEMODYNAMIC FINDINGS:  Central aortic pressure 109/57.   ANGIOGRAPHIC FINDINGS:  1. Bilateral renal arteries were patent without any significant      disease.  2. The distal aorta had  diffuse 40% plaque but no obstructive lesions      and aneurysmal segments.  3. The right common iliac artery had serial 40% stenoses but no flow      lending lesions.  There was moderate plaque in the right external      iliac artery.  The right common femoral artery was patent without      severe disease.  The right superficial femoral artery had a severe      95% stenosis in the proximal portion.  There were serial 40%      lesions throughout the mid portion of the right superficial femoral      artery.  There was 3-vessel runoff to the right foot.  4. The left common iliac artery had serial 40% lesions.  The left      external iliac artery had plaque disease.  The left common femoral      artery had plaque disease.  The left superficial femoral artery had      50% stenosis in the proximal portion.  The left superficial femoral      artery had a 100% occlusion in the distal portion with      reconstitution via collaterals in the distal vessel.  This was      approximately a 4 cm area of occlusion.  There was 3-vessel runoff      to the left foot.   IMPRESSION:  1. Severe peripheral vascular disease with chronic total occlusion of      the left superficial femoral artery and severe stenosis in the      right superficial femoral artery in the proximal portion.  2. Successful PTA with placement of a stent and a chronic total      occlusion of the left superficial femoral artery.   RECOMMENDATIONS:  The patient will be monitored closely overnight.  He  was given 600 mg of Plavix here in the cath lab.  Will continue aspirin  and Plavix as well as his other medications.  Will plan on bringing him  back in several weeks to perform PTA of the right superficial femoral  artery severe stenosis.  Will plan on using a Turbo Hawk atherectomy  device at that time.      Verne Carrow, MD     CM/MEDQ  D:  08/17/2009  T:  08/18/2009  Job:  [id number]   cc:   [name]. Plotnikov, MD    Electronically Signed by Verne Carrow MD on 08/19/2009 11:40:06 AM

## 2010-08-22 NOTE — Assessment & Plan Note (Signed)
[facility]                           PRIMARY CARE OFFICE NOTE   SAUD, BAIL                 MRN:          [id number]  DATE:11/26/2006                            DOB:          08-Nov-1940    PROCEDURES:  Skin biopsies.   INDICATIONS:  Mole with irregular color and other lesions suspicious for  skin cancer.  Risks including bleeding, infection, scarring, incomplete  procedure, need for other procedure as well as benefits, explained to  the patient in detail.  He agreed to proceed.   The patient was placed in decubitus position.  Lesion #1 on the right  cheek, of irregular shape, measuring 21 x 10 mm in largest dimension,  was prepped with Betadine and alcohol and injected with 1.5 mL of 2%  lidocaine with epinephrine.  The margins were marked with sterile  marker. The middle of the lesion was removed with the DermaBlade and  sent for pathology.  The rest of the lesion within demarcation was  treated with Hyfrecator in the usual fashion.  Tolerated well.  Complications:  None.  Band-Aid with antibiotic ointment applied.  Wound  instructions provided.   Mole #2, measuring 21 x 8 mm on the right mid back, was prepped with  Betadine and alcohol, injected with 1 mL of 2% lidocaine with  epinephrine.  Shave biopsy was performed.  Specimen sent to the lab.  Wound treated aggresively with Hyfrecator.  Band-Aid with antibiotic  ointment applied.  Wound instructions provided.  Tolerated well.  Complications:  None.   Lesion #3, a littler lower and to the left from lesion #2,  measuring 9  x 6 mm, was prepped with Betadine and alcohol, injected with 0.5 mL of  2% lidocaine with epinephrine.  Removed in identical fashion. Tolerated  well.  Complications:  None.  Specimen sent to the lab.  Wound  instructions provided.   CRYOSURGERY:   INDICATION:  Actinic keratosis.   The lesions, a total of 4 lesions on the face and 1 on the left lateral  proximal forearm treated with liquid nitrogen in the usual fashion.  Tolerated well.  Complications:  None.     [name]. Plotnikov, MD  Electronically Signed    AVP/MedQ  DD: 11/26/2006  DT: 11/27/2006  Job #: [id number]

## 2010-08-25 NOTE — H&P (Signed)
NAME:  [name], [name]                    ACCOUNT NO.:  [id number]   MEDICAL RECORD NO.:  [id number]                   PATIENT TYPE:  OIB   LOCATION:  NA                                   FACILITY:  [facility]   PHYSICIAN:  Guadelupe Sabin, M.D.             DATE OF BIRTH:  1940/10/30   DATE OF ADMISSION:  03/23/2003  DATE OF DISCHARGE:                                HISTORY & PHYSICAL   This was a planned outpatient surgical admission of this 70 year old white  male admitted for cataract implant surgery of the right eye.   HISTORY OF PRESENT ILLNESS:  This patient was first seen in my office on  March 03, 2003.  At that time, the patient had been seen by Dr. Salvatore Marvel, an optometrist, and told that he had cataract formation in both eyes  and possible macular degeneration.  When seen in my office, his vision was  recorded at 20/50 -2 right eye, 20/40 left eye without correction and 20/200  right eye, 20/30 left eye with correction.  With refraction, vision improved  to 20/30 to 20/40.  Slit lamp examination revealed the cornea to be clear,  anterior chamber deep and clear, nuclear cataract formation in both eyes.  The patient was told that to receive accurate visual acuity and balance in  both eyes without an isometropia that he would require cataract implant  surgery.  The patient wished to proceed with surgery.  When he returned on  March 18, 2003, vision was recorded at 20/100 -1 right eye with a Mentor  potential visual acuity reading 20/20 was noted.  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  The patient  was given oral discussion and printed information concerning cataract  implant surgery.  He signed an informed consent and arrangements were made  for his outpatient admission at this time.   CURRENT MEDICATIONS:  Norvasc, Imdur, Xanax, Zantac, Vicodin,  Darvocet,  Claritin and aspirin.   ALLERGIES:  He is said to be allergic to CODEINE.   PAST MEDICAL HISTORY:  The patient is in stable general health under the  care of Dr. Bradd Canary.   REVIEW OF SYMPTOMS:  No cardiorespiratory complaints.   PHYSICAL EXAMINATION:  GENERAL APPEARANCE:  The patient is a pleasant 51-  year-old white male in no acute distress.  VITAL SIGNS:  As recorded on admission.  Blood pressure 110/65, pulse 57,  respiratory rate 16, temperature 97.1.  HEENT:  Eyes:  Ocular examination is noted above.  CHEST:  Lungs clear to percussion and auscultation.  CARDIOVASCULAR:  Normal sinus rhythm, no cardiomegaly, no murmurs.  ABDOMEN:  Negative.  EXTREMITIES:  Negative.   ADMISSION DIAGNOSIS:  Senile cataract right eye greater than left.   PLAN:  Cataract implant surgery right eye now, left eye later as needed.  Guadelupe Sabin, M.D.    HNJ/MEDQ  D:  03/23/2003  T:  03/23/2003  Job:  [id number]   cc:   Quita Skye. Artis Flock, M.D.  [address]  [place]  [place] [address]  Fax: [phone]

## 2010-08-25 NOTE — Assessment & Plan Note (Signed)
[facility]                              CARDIOLOGY OFFICE NOTE   MONTOYA, BRANDEL                 MRN:          [id number]  DATE:11/12/2005                            DOB:          15-Sep-1940    PRIMARY CARE PHYSICIAN:  Sonda Primes, M.D.   CLINICAL HISTORY:  [name] is 70 years old and had coronary  artery bypass surgery in 1993 at which time he had a LIMA graft to marginal  branch of the circumflex artery and a vein graft to the posterolateral  branch of the circumflex artery.  He was subsequently studied in 1999 and  found to have an atrophic LIMA graft.  The vein to the marginal branch was  supplying most of his posterior circulation.  His LAD was okay and his right  coronary artery was nondominant.   He has been doing well from the standpoint of his heart.  He has had no  chest pain, shortness of breath or palpitations.  However, his activity is  severely limited by his bad lumbar spine disease.   PAST MEDICAL HISTORY:  Significant for degenerative disease of the lumbar  spine and two previous operations.  He also has degenerative disease of the  cervical spine.  He has hyperlipidemia.   CURRENT MEDICATIONS:  Lorazepam, Imdur, aspirin, metoprolol, Norvasc,  Claritin, Avodart, Prilosec and Vytorin.   PHYSICAL EXAMINATION:  On examination today the blood pressure was 112/58  and the pulse 54 and regular.  There was no vein distention.  Carotid pulses  were full without bruits.  CHEST:  Clear.  HEART:  Rhythm was regular.  He had no murmurs or gallops.  ABDOMEN:  Soft without organomegaly.  Peripheral pulses were full.  There was no peripheral edema.   An ECG was normal.   IMPRESSION:  1.  Coronary artery disease, status post coronary artery bypass graft      surgery in 1993 with coronary anatomy as described above.  2.  Good left ventricular function.  3.  Degenerative disease of the lumbar spine.  4.   Hyperlipidemia.   RECOMMENDATIONS:  I think [name] is doing well.  His lipid profile  was not quite at target, but he has been intolerant to statins and is only  taking his Vytorin every other day.  He is now 14 years post bypass surgery  and it has been two years since he had a Myoview scan, so  will plan to do an adenosine rest stress Myoview scan.  If this is okay, I  will plan to see him back in a year.                               Nathan Elvera Lennox Juanda Chance, MD, [facility]    BRB/MedQ  DD:  11/12/2005  DT:  11/12/2005  Job #:  [id number]

## 2010-08-25 NOTE — H&P (Signed)
NAME:  [name], [name]                    ACCOUNT NO.:  [id number]   MEDICAL RECORD NO.:  [id number]                   PATIENT TYPE:  OIB   LOCATION:  NA                                   FACILITY:  [facility]   PHYSICIAN:  Guadelupe Sabin, M.D.             DATE OF BIRTH:  1940-09-24   DATE OF ADMISSION:  03/23/2003  DATE OF DISCHARGE:                                HISTORY & PHYSICAL   PREOPERATIVE DIAGNOSIS:  Senile nuclear cataract right eye.   POSTOPERATIVE DIAGNOSIS:  Senile nuclear cataract right eye.   OPERATION:  Planned extracapsular cataract extraction - phacoemulsification,  primary insertion of posterior chamber intraocular lens implant.   SURGEON:  Guadelupe Sabin, M.D.   ASSISTANT:  Nurse.   ANESTHESIA:  Local 4% Xylocaine, 0.75 Marcaine, topical tetracaine,  intraocular Xylocaine.  Anesthesia standby required.  Patient given sodium  Pentothal intravenously during the period of retrobulbar blocking.   DESCRIPTION OF PROCEDURE:  After the patient was prepped and draped, a lid  speculum was inserted in the right eye.  The eye was turned downward and a  superior rectus traction suture placed.  Schiotz tonometry was recorded at 7  scale units with a 5.5 g weight.  A peritomy was performed adjacent to the  limbus from 11 to 1 o'clock position.  The corneoscleral junction was  cleaned and a corneoscleral groove made with a 45 degree Superblade.  The  anterior chamber was then entered with the 2.5 mm diamond keratome at the 12  o'clock position.  Using a bent 26-guage needle on an OcuCoat syringe, a  circular capsulorrhexis was begun and then completed with the Grabow  forceps.  Hydrodissection and hydrodelineation were performed using 1%  Xylocaine.  The 30 degree phacoemulsification tip was then inserted with  slow controlled emulsification of the lens nucleus.  Total ultrasonic time  was 30 seconds.  Average power level 16%.  Total amount of fluid used 60 mL.  Following removal of the nucleus, the residual cortex was aspirated with the  irrigation aspiration tip.  The posterior capsule appeared intact with a  brilliant red fundus reflex.  It was therefore elected to insert an Aller[employer] Opt[employer] SI40 NB silicone three-piece posterior chamber intraocular  lens implant.  Diopter strength +21.00.  This was inserted with the McDonald  forceps into the anterior chamber and then centered into the capsular bag  using the [facility] lens rotator.  The lens appeared to be well centered.  The  Healon and OcuCoat which had been used during the procedure was aspirated  and replaced with balanced salt solution and Miochol ophthalmic solution.  The operative incisions  appeared to be self-sealing and no sutures were required.  A light patch and  protector shield were applied to the operated right eye.  Duration of the  procedure and anesthesia administration was 45 minutes.  The patient  tolerated the procedure well in general and left the  operating room for the  recovery room.                                                Guadelupe Sabin, M.D.    HNJ/MEDQ  D:  03/23/2003  T:  03/23/2003  Job:  [id number]   cc:   Quita Skye. Artis Flock, M.D.  [address], [address]  [place]  [place] [address]  Fax: [phone]

## 2010-09-14 ENCOUNTER — Ambulatory Visit (INDEPENDENT_AMBULATORY_CARE_PROVIDER_SITE_OTHER): Payer: Medicare Other | Admitting: Cardiovascular Disease

## 2010-09-14 ENCOUNTER — Encounter: Payer: Self-pay | Admitting: Cardiovascular Disease

## 2010-09-14 VITALS — BP 122/62 | HR 57 | Ht 71.5 in | Wt 166.0 lb

## 2010-09-14 DIAGNOSIS — I7389 Other specified peripheral vascular diseases: Secondary | ICD-10-CM

## 2010-09-14 DIAGNOSIS — I2510 Atherosclerotic heart disease of native coronary artery without angina pectoris: Secondary | ICD-10-CM

## 2010-09-14 DIAGNOSIS — I739 Peripheral vascular disease, unspecified: Secondary | ICD-10-CM

## 2010-09-14 NOTE — Progress Notes (Signed)
History of Present Illness:70 yo WM with history of CAD s/p CABG, PVD, hyperlipidemia who is here today for PV follow up. I initially saw him in March 2011 for evaluation of his severe bilateral lower ext pain. I arranged a CT angiogram which showed disease in the ostium of the right common iliac and serial lesions in the right SFA and a total occlusion of the left SFA. We proceeded with a distal aortogram with bilateral lower ext runoff. I was able to open his left SFA chronic total occlusion and placed a stent in May 2011. I then staged intervention of his right SFA severe stenosis and treated this with a Silverhawk atherectomy device in June 2011. Follow up ABI normal range in December 2011. Stent in left SFA was patent.   He is here today for follow up and is doing well. He has occasional pain in his left leg. No rest pain or ulcerations.   Past Medical History  Diagnosis Date  . CAD (coronary artery disease)   . History of transient ischemic attack (TIA)   . Hyperlipidemia   . Peripheral neuropathy   . PVD (peripheral vascular disease) with claudication   . Carotid bruit     bilateral  . Preoperative examination   . Bronchitis, acute   . Ventral hernia   . Personal history of prostate cancer   . Diverticulosis   . ALLERGIC RHINITIS   . Osteoarthritis   . Low back pain   . COPD (chronic obstructive pulmonary disease)   . Tobacco use disorder, continuous     Past Surgical History  Procedure Date  . Coronary artery bypass graft 1992  . Lumbar laminectomy [id number]     X2  . Prostatectomy 05/2008    Dr. Laverle Patter and XRT  . Appendectomy   . Tonsillectomy     Current Outpatient Prescriptions  Medication Sig Dispense Refill  . amLODipine (NORVASC) 5 MG tablet Take 1 tablet (5 mg total) by mouth daily.  30 tablet  6  . aspirin 81 MG tablet Take 81 mg by mouth daily.        . Cholecalciferol (VITAMIN D3) 1000 UNIT capsule Take 1,000 Units by mouth daily.        Marland Kitchen  ezetimibe-simvastatin (VYTORIN) 10-20 MG per tablet Take 1 tablet by mouth every other day.        . hydrocortisone (ANUSOL-HC) 25 MG suppository Place 25 mg rectally at bedtime as needed. For rectal bleeding       . isosorbide mononitrate (IMDUR) 30 MG 24 hr tablet Take 30 mg by mouth daily.        Marland Kitchen loratadine (CLARITIN) 10 MG tablet Take 10 mg by mouth daily as needed. For allergies       . LORazepam (ATIVAN) 0.5 MG tablet Take 0.5 mg by mouth at bedtime.        . metoprolol (LOPRESSOR) 50 MG tablet Take 25 mg by mouth daily.        . Multiple Vitamin (MULTIVITAMIN) tablet Take 1 tablet by mouth daily.        Marland Kitchen omeprazole (PRILOSEC) 20 MG capsule Take 20 mg by mouth daily.        . Probiotic Product (ALIGN PO) Take 1 capsule by mouth daily. For 2 months       . traMADol (ULTRAM) 50 MG tablet Take 50 mg by mouth as needed.        Marland Kitchen DISCONTD: aspirin 325 MG EC tablet Take 325 mg by  mouth daily.        Marland Kitchen DISCONTD: dicyclomine (BENTYL) 10 MG capsule Take 10 mg by mouth 2 (two) times daily as needed. for abdominal pain and spasms      . DISCONTD: metoprolol (TOPROL-XL) 50 MG 24 hr tablet Take 25 mg by mouth daily.          Allergies  Allergen Reactions  . Codeine Sulfate     History   Social History  . Marital Status: Married    Spouse Name: N/A    Number of Children: N/A  . Years of Education: N/A   Occupational History  . Retired-self employed    Social History Main Topics  . Smoking status: Current Everyday Smoker -- 40 years    Types: Cigarettes  . Smokeless tobacco: Not on file  . Alcohol Use: No  . Drug Use: No  . Sexually Active:    Other Topics Concern  . Not on file   Social History Narrative  . No narrative on file    Family History  Problem Relation Age of Onset  . Hypertension    . Cancer Mother     Colon  . Heart disease Father   . Coronary artery disease Father   . Cancer Sister   . Heart disease Brother     Review of Systems:  As stated in the HPI  and otherwise negative.   BP 122/62  Pulse 57  Ht 5' 11.5" (1.816 m)  Wt 166 lb (75.297 kg)  BMI 22.83 kg/m2  Physical Examination: General: Well developed, well nourished, NAD HEENT: OP clear, mucus membranes moist SKIN: warm, dry. No rashes. Neuro: No focal deficits Musculoskeletal: Muscle strength 5/5 all ext Psychiatric: Mood and affect normal Neck: No JVD, no carotid bruits, no thyromegaly, no lymphadenopathy. Lungs:Clear bilaterally, no wheezes, rhonci, crackles Cardiovascular: Regular rate and rhythm. No murmurs, gallops or rubs. Abdomen:Soft. Bowel sounds present. Non-tender.  Extremities: No lower extremity edema. Pulses are trace to 1+ in the bilateral DP/PT.  ZOX:WRUEA bradycardia, rate 54 bpm. Left axis deviation.

## 2010-09-14 NOTE — Assessment & Plan Note (Signed)
Stable No changes 

## 2010-09-14 NOTE — Assessment & Plan Note (Signed)
Stable. No claudication. No rest pain. Will get surveillance ABI this summer. He is encouraged to stop smoking.

## 2010-09-14 NOTE — Patient Instructions (Signed)
Your physician recommends that you schedule a follow-up appointment in: 6 months with Dr. Clifton James  Your physician has requested that you have an ankle brachial index (ABI). During this test an ultrasound and blood pressure cuff are used to evaluate the arteries that supply the arms and legs with blood. Allow thirty minutes for this exam. There are no restrictions or special instructions.

## 2010-09-20 ENCOUNTER — Other Ambulatory Visit: Payer: Self-pay | Admitting: Internal Medicine

## 2010-09-20 ENCOUNTER — Other Ambulatory Visit (INDEPENDENT_AMBULATORY_CARE_PROVIDER_SITE_OTHER): Payer: Medicare Other

## 2010-09-20 DIAGNOSIS — E785 Hyperlipidemia, unspecified: Secondary | ICD-10-CM

## 2010-09-20 DIAGNOSIS — T887XXA Unspecified adverse effect of drug or medicament, initial encounter: Secondary | ICD-10-CM

## 2010-09-20 DIAGNOSIS — E781 Pure hyperglyceridemia: Secondary | ICD-10-CM

## 2010-09-20 DIAGNOSIS — E7800 Pure hypercholesterolemia, unspecified: Secondary | ICD-10-CM

## 2010-09-20 LAB — CBC WITH DIFFERENTIAL/PLATELET
Basophils Absolute: 0 10*3/uL (ref 0.0–0.1)
Eosinophils Absolute: 0.3 10*3/uL (ref 0.0–0.7)
HCT: 41 % (ref 39.0–52.0)
Hemoglobin: 14.2 g/dL (ref 13.0–17.0)
Lymphs Abs: 0.9 10*3/uL (ref 0.7–4.0)
MCHC: 34.7 g/dL (ref 30.0–36.0)
Monocytes Absolute: 0.6 10*3/uL (ref 0.1–1.0)
Neutro Abs: 4.4 10*3/uL (ref 1.4–7.7)
RDW: 12.6 % (ref 11.5–14.6)

## 2010-09-20 LAB — BASIC METABOLIC PANEL
BUN: 13 mg/dL (ref 6–23)
CO2: 30 mEq/L (ref 19–32)
Calcium: 9.2 mg/dL (ref 8.4–10.5)
Chloride: 104 mEq/L (ref 96–112)
Creatinine, Ser: 0.8 mg/dL (ref 0.4–1.5)

## 2010-09-20 LAB — LIPID PANEL
Cholesterol: 138 mg/dL (ref 0–200)
HDL: 36.4 mg/dL — ABNORMAL LOW (ref >39.00)
Triglycerides: 87 mg/dL (ref 0.0–149.0)

## 2010-09-20 LAB — HEPATIC FUNCTION PANEL
ALT: 13 U/L (ref 0–53)
Total Bilirubin: 0.7 mg/dL (ref 0.3–1.2)
Total Protein: 6.8 g/dL (ref 6.0–8.3)

## 2010-09-21 ENCOUNTER — Other Ambulatory Visit: Payer: Self-pay | Admitting: *Deleted

## 2010-09-21 MED ORDER — EZETIMIBE-SIMVASTATIN 10-20 MG PO TABS: 1.0000 | ORAL_TABLET | ORAL | Status: DC

## 2010-09-26 ENCOUNTER — Encounter: Payer: Self-pay | Admitting: Internal Medicine

## 2010-09-26 ENCOUNTER — Ambulatory Visit (INDEPENDENT_AMBULATORY_CARE_PROVIDER_SITE_OTHER): Payer: Medicare Other | Admitting: Internal Medicine

## 2010-09-26 DIAGNOSIS — J209 Acute bronchitis, unspecified: Secondary | ICD-10-CM

## 2010-09-26 DIAGNOSIS — M545 Low back pain: Secondary | ICD-10-CM

## 2010-09-26 DIAGNOSIS — B079 Viral wart, unspecified: Secondary | ICD-10-CM

## 2010-09-26 DIAGNOSIS — I2510 Atherosclerotic heart disease of native coronary artery without angina pectoris: Secondary | ICD-10-CM

## 2010-09-26 DIAGNOSIS — R21 Rash and other nonspecific skin eruption: Secondary | ICD-10-CM

## 2010-09-26 DIAGNOSIS — M1990 Unspecified osteoarthritis, unspecified site: Secondary | ICD-10-CM

## 2010-09-26 MED ORDER — AMOXICILLIN 500 MG PO CAPS: 1000.0000 mg | ORAL_CAPSULE | Freq: Two times a day (BID) | ORAL | Status: AC

## 2010-09-26 MED ORDER — TRIAMCINOLONE ACETONIDE 0.5 % EX CREA: TOPICAL_CREAM | Freq: Three times a day (TID) | CUTANEOUS | Status: AC

## 2010-09-26 NOTE — Assessment & Plan Note (Signed)
Cont Rx 

## 2010-09-26 NOTE — Assessment & Plan Note (Signed)
Amox x 10 d 

## 2010-09-26 NOTE — Assessment & Plan Note (Signed)
Will treat w/cryo

## 2010-09-26 NOTE — Assessment & Plan Note (Signed)
On Rx 

## 2010-09-26 NOTE — Progress Notes (Signed)
  Subjective:    Patient ID: [name], male    DOB: 20-Feb-1941, 70 y.o.   MRN: [id number]  HPI  The patient presents for a follow-up of  chronic hypertension, chronic dyslipidemia, COPD controlled with medicines C/o wart on leg C/o URI - yellow mucus     Review of Systems  Constitutional: Negative for appetite change, fatigue and unexpected weight change.  HENT: Positive for congestion and rhinorrhea. Negative for nosebleeds, sore throat, sneezing, trouble swallowing and neck pain.   Eyes: Negative for itching and visual disturbance.  Respiratory: Positive for cough.   Cardiovascular: Negative for chest pain, palpitations and leg swelling.  Gastrointestinal: Negative for nausea, diarrhea, blood in stool and abdominal distention.  Genitourinary: Negative for frequency and hematuria.  Musculoskeletal: Positive for back pain, arthralgias and gait problem. Negative for joint swelling.  Skin: Negative for rash.  Neurological: Negative for dizziness, tremors, speech difficulty and weakness.  Psychiatric/Behavioral: Negative for sleep disturbance, dysphoric mood and agitation. The patient is not nervous/anxious.        Objective:   Physical Exam  Constitutional: He is oriented to person, place, and time. He appears well-developed.       NAD  HENT:  Mouth/Throat: Oropharynx is clear and moist.       erythem mucosa  Eyes: Conjunctivae are normal. Pupils are equal, round, and reactive to light.  Neck: Normal range of motion. No JVD present. No thyromegaly present.  Cardiovascular: Normal rate, regular rhythm, normal heart sounds and intact distal pulses.  Exam reveals no gallop and no friction rub.   No murmur heard. Pulmonary/Chest: Effort normal. No respiratory distress. He has no wheezes. He has no rales. He exhibits no tenderness.  Abdominal: Soft. Bowel sounds are normal. He exhibits no distension and no mass. There is no tenderness. There is no rebound and no guarding.    Musculoskeletal: Normal range of motion. He exhibits no edema and no tenderness.  Lymphadenopathy:    He has no cervical adenopathy.  Neurological: He is alert and oriented to person, place, and time. He has normal reflexes. No cranial nerve deficit. He exhibits normal muscle tone. Coordination normal.       Using a cane  Skin: Skin is warm and dry. No rash noted.  Psychiatric: He has a normal mood and affect. His behavior is normal. Judgment and thought content normal.         L knee wart Assessment & Plan:   Procedure Note :     Procedure : Cryosurgery   Indication:  Wart(s)     Risks including unsuccessful procedure , bleeding, infection, bruising, scar, a need for a repeat  procedure and others were explained to the patient in detail as well as the benefits. Informed consent was obtained verbally.     1 lesion(s)  on    L dist anter knee was/were treated with liquid nitrogen on a Q-tip in a usual fasion . Band-Aid was applied and antibiotic ointment was given for a later use.   Tolerated well. Complications none.   Postprocedure instructions :     Keep the wounds clean. You can wash them with liquid soap and water. Pat dry with gauze or a Kleenex tissue  Before applying antibiotic ointment and a Band-Aid.   You need to report immediately  if  any signs of infection develop.

## 2010-10-23 ENCOUNTER — Encounter (INDEPENDENT_AMBULATORY_CARE_PROVIDER_SITE_OTHER): Payer: Medicare Other | Admitting: Cardiology

## 2010-10-23 DIAGNOSIS — I739 Peripheral vascular disease, unspecified: Secondary | ICD-10-CM

## 2010-10-26 ENCOUNTER — Encounter: Payer: Self-pay | Admitting: Cardiovascular Disease

## 2011-01-03 ENCOUNTER — Encounter: Payer: Self-pay | Admitting: Internal Medicine

## 2011-01-03 ENCOUNTER — Ambulatory Visit (INDEPENDENT_AMBULATORY_CARE_PROVIDER_SITE_OTHER): Payer: Medicare Other | Admitting: Internal Medicine

## 2011-01-03 VITALS — BP 100/50 | HR 60 | Temp 98.6°F | Resp 16 | Wt 167.0 lb

## 2011-01-03 DIAGNOSIS — M1990 Unspecified osteoarthritis, unspecified site: Secondary | ICD-10-CM

## 2011-01-03 DIAGNOSIS — G609 Hereditary and idiopathic neuropathy, unspecified: Secondary | ICD-10-CM

## 2011-01-03 DIAGNOSIS — I2510 Atherosclerotic heart disease of native coronary artery without angina pectoris: Secondary | ICD-10-CM

## 2011-01-03 DIAGNOSIS — M545 Low back pain, unspecified: Secondary | ICD-10-CM

## 2011-01-03 DIAGNOSIS — K439 Ventral hernia without obstruction or gangrene: Secondary | ICD-10-CM

## 2011-01-03 DIAGNOSIS — I7389 Other specified peripheral vascular diseases: Secondary | ICD-10-CM

## 2011-01-03 NOTE — Assessment & Plan Note (Signed)
Continue with current prescription therapy as reflected on the Med list.  

## 2011-01-03 NOTE — Progress Notes (Signed)
  Subjective:    Patient ID: [name], male    DOB: May 07, 1940, 70 y.o.   MRN: [id number]  HPI   The patient is here to follow up on chronic PVD, CAD and chronic moderate OA symptoms controlled with medicines, diet and exercise.   Review of Systems  Constitutional: Negative for appetite change, fatigue and unexpected weight change.  HENT: Negative for nosebleeds, congestion, sore throat, sneezing, trouble swallowing and neck pain.   Eyes: Negative for itching and visual disturbance.  Respiratory: Negative for cough.   Cardiovascular: Negative for chest pain, palpitations and leg swelling.  Gastrointestinal: Negative for nausea, diarrhea, blood in stool and abdominal distention.  Genitourinary: Negative for frequency and hematuria.  Musculoskeletal: Positive for back pain and gait problem. Negative for joint swelling.  Skin: Negative for rash.  Neurological: Negative for dizziness, tremors, speech difficulty and weakness.  Psychiatric/Behavioral: Negative for sleep disturbance, dysphoric mood and agitation. The patient is not nervous/anxious.        Objective:   Physical Exam  Constitutional: He is oriented to person, place, and time. He appears well-developed.  HENT:  Mouth/Throat: Oropharynx is clear and moist.  Eyes: Conjunctivae are normal. Pupils are equal, round, and reactive to light.  Neck: Normal range of motion. No JVD present. No thyromegaly present.  Cardiovascular: Normal rate, regular rhythm, normal heart sounds and intact distal pulses.  Exam reveals no gallop and no friction rub.   No murmur heard. Pulmonary/Chest: Effort normal and breath sounds normal. No respiratory distress. He has no wheezes. He has no rales. He exhibits no tenderness.  Abdominal: Soft. Bowel sounds are normal. He exhibits no distension and no mass. There is no tenderness. There is no rebound and no guarding.  Musculoskeletal: Normal range of motion. He exhibits tenderness. He  exhibits no edema.  Lymphadenopathy:    He has no cervical adenopathy.  Neurological: He is alert and oriented to person, place, and time. He has normal reflexes. No cranial nerve deficit. He exhibits normal muscle tone. Coordination (cane) abnormal.  Skin: Skin is warm and dry. No rash noted.  Psychiatric: He has a normal mood and affect. His behavior is normal. Judgment and thought content normal.          Assessment & Plan:

## 2011-03-05 ENCOUNTER — Telehealth: Payer: Self-pay | Admitting: *Deleted

## 2011-03-05 NOTE — Telephone Encounter (Signed)
Patient Informed

## 2011-03-05 NOTE — Telephone Encounter (Signed)
Use over-the-counter  "cold" medicines  such as   "Mucinex" for cough and congestion. Avoid decongestants if you have high blood pressure and use "Afrin" nasal spray for nasal congestion as directed instead. Use" Delsym" or" Robitussin" cough syrup varietis for cough.  You can use plain "Tylenol" or "Advi"l for fever, chills and achyness.   "Common cold" symptoms are usually triggered by a virus.  The antibiotics are usually not necessary. On average, a" viral cold" illness would take 4-7 days to resolve. Please, make an appointment if you are not better or if you're worse.

## 2011-03-05 NOTE — Telephone Encounter (Signed)
Pt states he has a bad head & chest cold: low-grade fever, dizziness, No nausea or vomiting; does he need OV or Rx?

## 2011-03-14 ENCOUNTER — Ambulatory Visit (INDEPENDENT_AMBULATORY_CARE_PROVIDER_SITE_OTHER): Payer: Medicare Other | Admitting: Cardiovascular Disease

## 2011-03-14 ENCOUNTER — Encounter: Payer: Self-pay | Admitting: Cardiovascular Disease

## 2011-03-14 VITALS — BP 111/56 | HR 62 | Ht 71.0 in | Wt 164.0 lb

## 2011-03-14 DIAGNOSIS — I2510 Atherosclerotic heart disease of native coronary artery without angina pectoris: Secondary | ICD-10-CM

## 2011-03-14 DIAGNOSIS — F17200 Nicotine dependence, unspecified, uncomplicated: Secondary | ICD-10-CM

## 2011-03-14 DIAGNOSIS — I7389 Other specified peripheral vascular diseases: Secondary | ICD-10-CM

## 2011-03-14 NOTE — Assessment & Plan Note (Signed)
Stable. Continue medical management. No changes. Lipids and BP well controlled.

## 2011-03-14 NOTE — Assessment & Plan Note (Signed)
Stable. ABI normal in July. Repeat full lower ext duplex in 6 months. Pt also with bilateral carotid bruits. Repeat carotid artery dopplers in 6 months.

## 2011-03-14 NOTE — Assessment & Plan Note (Signed)
Smoking cessation encouraged!

## 2011-03-14 NOTE — Progress Notes (Signed)
History of Present Illness: 70 yo WM with history of CAD s/p CABG, PVD, hyperlipidemia who is here today for cardiac and PV follow up. I initially saw him in March 2011 for evaluation of his severe bilateral lower ext pain. I arranged a CT angiogram which showed disease in the ostium of the right common iliac and serial lesions in the right SFA and a total occlusion of the left SFA. We proceeded with a distal aortogram with bilateral lower ext runoff. I was able to open his left SFA chronic total occlusion and placed a stent in May 2011. I then staged intervention of his right SFA severe stenosis and treated this with a Silverhawk atherectomy device in June 2011. Follow up ABI normal range in July 2012. Stent in left SFA was patent. Dr. Juanda Chance has followed his cardiac issus in the past. I will now follow his cardiac and PV issues.   He is here today for follow up and is doing well. He has occasional pain in his left leg. No rest pain or ulcerations. No real claudication. No chest pain, SOB. He continues to smoke.   His primary care is Dr. Posey Rea.    Past Medical History  Diagnosis Date  . CAD (coronary artery disease)   . History of transient ischemic attack (TIA)   . Hyperlipidemia   . Peripheral neuropathy   . PVD (peripheral vascular disease) with claudication   . Carotid bruit     bilateral  . Preoperative examination   . Bronchitis, acute   . Ventral hernia   . Personal history of prostate cancer   . Diverticulosis   . ALLERGIC RHINITIS   . Osteoarthritis   . Low back pain   . COPD (chronic obstructive pulmonary disease)   . Tobacco use disorder, continuous     Past Surgical History  Procedure Date  . Coronary artery bypass graft 1992  . Lumbar laminectomy [id number]     X2  . Prostatectomy 05/2008    Dr. Laverle Patter and XRT  . Appendectomy   . Tonsillectomy     Current Outpatient Prescriptions  Medication Sig Dispense Refill  . amLODipine (NORVASC) 5 MG tablet Take 1  tablet (5 mg total) by mouth daily.  30 tablet  6  . aspirin 81 MG tablet Take 81 mg by mouth daily.        . Cholecalciferol (VITAMIN D3) 1000 UNIT capsule Take 1,000 Units by mouth daily.        Marland Kitchen ezetimibe-simvastatin (VYTORIN) 10-20 MG per tablet Take 1 tablet by mouth every other day.  15 tablet  8  . isosorbide mononitrate (IMDUR) 30 MG 24 hr tablet Take 30 mg by mouth daily.        Marland Kitchen loratadine (CLARITIN) 10 MG tablet Take 10 mg by mouth daily as needed. For allergies       . LORazepam (ATIVAN) 0.5 MG tablet Take 0.5 mg by mouth at bedtime.        . metoprolol (LOPRESSOR) 50 MG tablet Take 25 mg by mouth daily.        . Multiple Vitamin (MULTIVITAMIN) tablet Take 1 tablet by mouth daily.        Marland Kitchen omeprazole (PRILOSEC) 20 MG capsule Take 20 mg by mouth daily.        . Probiotic Product (ALIGN PO) Take 1 capsule by mouth daily. For 2 months       . traMADol (ULTRAM) 50 MG tablet Take 50 mg by mouth as  needed.        . triamcinolone (KENALOG) 0.5 % cream Apply topically 3 (three) times daily.  60 g  0    Allergies  Allergen Reactions  . Codeine Sulfate     History   Social History  . Marital Status: Married    Spouse Name: N/A    Number of Children: N/A  . Years of Education: N/A   Occupational History  . Retired-self employed    Social History Main Topics  . Smoking status: Current Everyday Smoker -- 40 years    Types: Cigarettes  . Smokeless tobacco: Not on file  . Alcohol Use: No  . Drug Use: No  . Sexually Active: Yes   Other Topics Concern  . Not on file   Social History Narrative  . No narrative on file    Family History  Problem Relation Age of Onset  . Hypertension    . Cancer Mother     Colon  . Heart disease Father   . Coronary artery disease Father   . Cancer Sister   . Heart disease Brother     Review of Systems:  As stated in the HPI and otherwise negative.   BP 124/60  Pulse 61  Ht 5\' 11"  (1.803 m)  Wt 164 lb (74.39 kg)  BMI 22.87  kg/m2  Physical Examination: General: Well developed, well nourished, NAD HEENT: OP clear, mucus membranes moist SKIN: warm, dry. No rashes. Neuro: No focal deficits Musculoskeletal: Muscle strength 5/5 all ext Psychiatric: Mood and affect normal Neck: No JVD, bilateral  carotid artery bruits, no thyromegaly, no lymphadenopathy. Lungs:Clear bilaterally, no wheezes, rhonci, crackles Cardiovascular: Regular rate and rhythm. No murmurs, gallops or rubs. Abdomen:Soft. Bowel sounds present. Non-tender.  Extremities: No lower extremity edema. Pulses are 2 + in the right PT and  1+ in the left PT.

## 2011-03-14 NOTE — Patient Instructions (Signed)
Your physician wants you to follow-up in: 6 months.  You will receive a reminder letter in the mail two months in advance. If you don't receive a letter, please call our office to schedule the follow-up appointment.  Your physician has requested that you have a carotid duplex. This test is an ultrasound of the carotid arteries in your neck. It looks at blood flow through these arteries that supply the brain with blood. Allow one hour for this exam. There are no restrictions or special instructions. To be done in 6 months.  Your physician has requested that you have a lower or upper extremity arterial duplex. This test is an ultrasound of the arteries in the legs or arms. It looks at arterial blood flow in the legs and arms. Allow one hour for Lower and Upper Arterial scans. There are no restrictions or special instructions. To be done in 6 months.

## 2011-04-04 ENCOUNTER — Other Ambulatory Visit: Payer: Self-pay | Admitting: Cardiovascular Disease

## 2011-04-04 MED ORDER — ISOSORBIDE MONONITRATE ER 30 MG PO TB24: 30.0000 mg | ORAL_TABLET | Freq: Every day | ORAL | Status: DC

## 2011-04-12 ENCOUNTER — Other Ambulatory Visit (INDEPENDENT_AMBULATORY_CARE_PROVIDER_SITE_OTHER): Payer: Medicare Other

## 2011-04-12 DIAGNOSIS — M545 Low back pain, unspecified: Secondary | ICD-10-CM

## 2011-04-12 DIAGNOSIS — I7389 Other specified peripheral vascular diseases: Secondary | ICD-10-CM

## 2011-04-12 DIAGNOSIS — M1990 Unspecified osteoarthritis, unspecified site: Secondary | ICD-10-CM | POA: Diagnosis not present

## 2011-04-12 DIAGNOSIS — K439 Ventral hernia without obstruction or gangrene: Secondary | ICD-10-CM

## 2011-04-12 DIAGNOSIS — I2510 Atherosclerotic heart disease of native coronary artery without angina pectoris: Secondary | ICD-10-CM

## 2011-04-12 DIAGNOSIS — G609 Hereditary and idiopathic neuropathy, unspecified: Secondary | ICD-10-CM

## 2011-04-12 LAB — LIPID PANEL
LDL Cholesterol: 78 mg/dL (ref 0–99)
Total CHOL/HDL Ratio: 3
Triglycerides: 104 mg/dL (ref 0.0–149.0)

## 2011-04-12 LAB — COMPREHENSIVE METABOLIC PANEL
AST: 17 U/L (ref 0–37)
Albumin: 4.1 g/dL (ref 3.5–5.2)
Alkaline Phosphatase: 66 U/L (ref 39–117)
BUN: 13 mg/dL (ref 6–23)
Potassium: 4.7 mEq/L (ref 3.5–5.1)
Sodium: 139 mEq/L (ref 135–145)

## 2011-04-18 ENCOUNTER — Ambulatory Visit (INDEPENDENT_AMBULATORY_CARE_PROVIDER_SITE_OTHER): Payer: Medicare Other | Admitting: Internal Medicine

## 2011-04-18 ENCOUNTER — Encounter: Payer: Self-pay | Admitting: Internal Medicine

## 2011-04-18 DIAGNOSIS — I2510 Atherosclerotic heart disease of native coronary artery without angina pectoris: Secondary | ICD-10-CM

## 2011-04-18 DIAGNOSIS — J019 Acute sinusitis, unspecified: Secondary | ICD-10-CM | POA: Diagnosis not present

## 2011-04-18 DIAGNOSIS — J449 Chronic obstructive pulmonary disease, unspecified: Secondary | ICD-10-CM | POA: Diagnosis not present

## 2011-04-18 DIAGNOSIS — M545 Low back pain: Secondary | ICD-10-CM | POA: Diagnosis not present

## 2011-04-18 MED ORDER — AMOXICILLIN 500 MG PO CAPS: 1000.0000 mg | ORAL_CAPSULE | Freq: Two times a day (BID) | ORAL | Status: AC

## 2011-04-18 NOTE — Assessment & Plan Note (Signed)
Continue with current prescription therapy as reflected on the Med list.  

## 2011-04-18 NOTE — Assessment & Plan Note (Signed)
Amoxicillin x10d 

## 2011-04-18 NOTE — Progress Notes (Signed)
  Subjective:    Patient ID: [name], male    DOB: 1941-01-01, 71 y.o.   MRN: [id number]  HPI  The patient presents for a follow-up of  chronic hypertension, chronic dyslipidemia, PVD, COPD controlled with medicines. C/o sinusitis sx's  X 2-3 wks    Review of Systems  Constitutional: Negative for appetite change, fatigue and unexpected weight change.  HENT: Positive for nosebleeds, congestion and rhinorrhea. Negative for sore throat, sneezing, trouble swallowing and neck pain.   Eyes: Negative for itching and visual disturbance.  Respiratory: Positive for cough.   Cardiovascular: Negative for chest pain, palpitations and leg swelling.  Gastrointestinal: Negative for nausea, diarrhea, blood in stool and abdominal distention.  Genitourinary: Negative for frequency and hematuria.  Musculoskeletal: Positive for back pain, arthralgias and gait problem. Negative for joint swelling.  Skin: Negative for rash.  Neurological: Negative for dizziness, tremors, speech difficulty and weakness.  Psychiatric/Behavioral: Negative for sleep disturbance, dysphoric mood and agitation. The patient is not nervous/anxious.        Objective:   Physical Exam  Constitutional: He is oriented to person, place, and time. He appears well-developed.  HENT:  Mouth/Throat: Oropharynx is clear and moist.       Swollen nasal mucosa  Eyes: Conjunctivae are normal. Pupils are equal, round, and reactive to light.  Neck: Normal range of motion. No JVD present. No thyromegaly present.  Cardiovascular: Normal rate, regular rhythm, normal heart sounds and intact distal pulses.  Exam reveals no gallop and no friction rub.   No murmur heard. Pulmonary/Chest: Effort normal and breath sounds normal. No respiratory distress. He has no wheezes. He has no rales. He exhibits no tenderness.  Abdominal: Soft. Bowel sounds are normal. He exhibits no distension and no mass. There is no tenderness. There is no rebound and  no guarding.  Musculoskeletal: Normal range of motion. He exhibits tenderness. He exhibits no edema.       cane  Lymphadenopathy:    He has no cervical adenopathy.  Neurological: He is alert and oriented to person, place, and time. He has normal reflexes. No cranial nerve deficit. He exhibits normal muscle tone. Coordination normal.  Skin: Skin is warm and dry. No rash noted.  Psychiatric: He has a normal mood and affect. His behavior is normal. Judgment and thought content normal.          Assessment & Plan:

## 2011-04-18 NOTE — Assessment & Plan Note (Signed)
Discussed.

## 2011-04-30 ENCOUNTER — Other Ambulatory Visit: Payer: Self-pay

## 2011-04-30 MED ORDER — AMLODIPINE BESYLATE 5 MG PO TABS: 5.0000 mg | ORAL_TABLET | Freq: Every day | ORAL | Status: DC

## 2011-04-30 NOTE — Telephone Encounter (Signed)
..   Requested Prescriptions   Signed Prescriptions Disp Refills  . amLODipine (NORVASC) 5 MG tablet 30 tablet 6    Sig: Take 1 tablet (5 mg total) by mouth daily.    Authorizing Provider: Verne Carrow    Ordering User: Christella Hartigan, Carlina Derks Judie Petit

## 2011-05-03 DIAGNOSIS — C61 Malignant neoplasm of prostate: Secondary | ICD-10-CM | POA: Diagnosis not present

## 2011-05-11 DIAGNOSIS — Z8546 Personal history of malignant neoplasm of prostate: Secondary | ICD-10-CM | POA: Diagnosis not present

## 2011-06-19 ENCOUNTER — Other Ambulatory Visit: Payer: Self-pay

## 2011-06-19 ENCOUNTER — Other Ambulatory Visit: Payer: Self-pay | Admitting: *Deleted

## 2011-06-19 MED ORDER — AMLODIPINE BESYLATE 5 MG PO TABS: 5.0000 mg | ORAL_TABLET | Freq: Every day | ORAL | Status: DC

## 2011-06-19 MED ORDER — ISOSORBIDE MONONITRATE ER 30 MG PO TB24: 30.0000 mg | ORAL_TABLET | Freq: Every day | ORAL | Status: DC

## 2011-06-19 MED ORDER — EZETIMIBE-SIMVASTATIN 10-20 MG PO TABS: 1.0000 | ORAL_TABLET | ORAL | Status: DC

## 2011-06-19 NOTE — Telephone Encounter (Signed)
..   Requested Prescriptions   Signed Prescriptions Disp Refills  . ezetimibe-simvastatin (VYTORIN) 10-20 MG per tablet 15 tablet 8    Sig: Take 1 tablet by mouth every other day.    Authorizing Provider: Verne Carrow D    Ordering User: Lacie Scotts

## 2011-06-22 ENCOUNTER — Other Ambulatory Visit: Payer: Self-pay

## 2011-06-22 MED ORDER — AMLODIPINE BESYLATE 5 MG PO TABS: 5.0000 mg | ORAL_TABLET | Freq: Every day | ORAL | Status: DC

## 2011-06-22 MED ORDER — ISOSORBIDE MONONITRATE ER 30 MG PO TB24: 30.0000 mg | ORAL_TABLET | Freq: Every day | ORAL | Status: DC

## 2011-06-22 MED ORDER — EZETIMIBE-SIMVASTATIN 10-20 MG PO TABS: 1.0000 | ORAL_TABLET | ORAL | Status: DC

## 2011-07-18 ENCOUNTER — Encounter: Payer: Self-pay | Admitting: Internal Medicine

## 2011-07-18 ENCOUNTER — Ambulatory Visit (INDEPENDENT_AMBULATORY_CARE_PROVIDER_SITE_OTHER): Payer: Medicare Other | Admitting: Internal Medicine

## 2011-07-18 VITALS — BP 130/72 | HR 76 | Temp 98.5°F | Resp 16 | Wt 166.0 lb

## 2011-07-18 DIAGNOSIS — E785 Hyperlipidemia, unspecified: Secondary | ICD-10-CM | POA: Diagnosis not present

## 2011-07-18 DIAGNOSIS — Z8679 Personal history of other diseases of the circulatory system: Secondary | ICD-10-CM

## 2011-07-18 DIAGNOSIS — M545 Low back pain: Secondary | ICD-10-CM | POA: Diagnosis not present

## 2011-07-18 DIAGNOSIS — K5900 Constipation, unspecified: Secondary | ICD-10-CM | POA: Diagnosis not present

## 2011-07-18 DIAGNOSIS — I7389 Other specified peripheral vascular diseases: Secondary | ICD-10-CM

## 2011-07-18 DIAGNOSIS — I2510 Atherosclerotic heart disease of native coronary artery without angina pectoris: Secondary | ICD-10-CM | POA: Diagnosis not present

## 2011-07-18 NOTE — Assessment & Plan Note (Signed)
Continue with current prescription therapy as reflected on the Med list.  

## 2011-07-18 NOTE — Progress Notes (Signed)
Patient ID: [name], male   DOB: March 03, 1941, 72 y.o.   MRN: [id number]  Subjective:    Patient ID: [name], male    DOB: 04-17-1940, 71 y.o.   MRN: [id number]  HPI  The patient presents for a follow-up of  chronic hypertension, chronic dyslipidemia, PVD, COPD controlled with medicines. C/o sinusitis sx's  x 2-3 wks  Wt Readings from Last 3 Encounters:  07/18/11 166 lb (75.297 kg)  04/18/11 168 lb (76.204 kg)  03/14/11 164 lb (74.39 kg)   BP Readings from Last 3 Encounters:  07/18/11 130/72  04/18/11 118/72  03/14/11 111/56        Review of Systems  Constitutional: Negative for appetite change, fatigue and unexpected weight change.  HENT: Positive for nosebleeds, congestion and rhinorrhea. Negative for sore throat, sneezing, trouble swallowing and neck pain.   Eyes: Negative for itching and visual disturbance.  Respiratory: Positive for cough.   Cardiovascular: Negative for chest pain, palpitations and leg swelling.  Gastrointestinal: Negative for nausea, diarrhea, blood in stool and abdominal distention.  Genitourinary: Negative for frequency and hematuria.  Musculoskeletal: Positive for back pain, arthralgias and gait problem. Negative for joint swelling.  Skin: Negative for rash.  Neurological: Negative for dizziness, tremors, speech difficulty and weakness.  Psychiatric/Behavioral: Negative for sleep disturbance, dysphoric mood and agitation. The patient is not nervous/anxious.        Objective:   Physical Exam  Constitutional: He is oriented to person, place, and time. He appears well-developed.  HENT:  Mouth/Throat: Oropharynx is clear and moist.       Swollen nasal mucosa  Eyes: Conjunctivae are normal. Pupils are equal, round, and reactive to light.  Neck: Normal range of motion. No JVD present. No thyromegaly present.  Cardiovascular: Normal rate, regular rhythm, normal heart sounds and intact distal pulses.  Exam reveals no gallop and no  friction rub.   No murmur heard. Pulmonary/Chest: Effort normal and breath sounds normal. No respiratory distress. He has no wheezes. He has no rales. He exhibits no tenderness.  Abdominal: Soft. Bowel sounds are normal. He exhibits no distension and no mass. There is no tenderness. There is no rebound and no guarding.  Musculoskeletal: Normal range of motion. He exhibits tenderness. He exhibits no edema.       cane  Lymphadenopathy:    He has no cervical adenopathy.  Neurological: He is alert and oriented to person, place, and time. He has normal reflexes. No cranial nerve deficit. He exhibits normal muscle tone. Coordination normal.  Skin: Skin is warm and dry. No rash noted.  Psychiatric: He has a normal mood and affect. His behavior is normal. Judgment and thought content normal.   Lab Results  Component Value Date   WBC 6.3 09/20/2010   HGB 14.2 09/20/2010   HCT 41.0 09/20/2010   PLT 218.0 09/20/2010   GLUCOSE 110* 04/12/2011   CHOL 140 04/12/2011   TRIG 104.0 04/12/2011   HDL 41.40 04/12/2011   LDLCALC 78 04/12/2011   ALT 14 04/12/2011   AST 17 04/12/2011   NA 139 04/12/2011   K 4.7 04/12/2011   CL 103 04/12/2011   CREATININE 0.9 04/12/2011   BUN 13 04/12/2011   CO2 29 04/12/2011   TSH 1.14 04/12/2011   PSA 0.00* 02/16/2010   INR 1.0 ratio 09/12/2009   HGBA1C 5.7 07/11/2006          Assessment & Plan:

## 2011-08-27 ENCOUNTER — Telehealth: Payer: Self-pay | Admitting: Cardiovascular Disease

## 2011-08-27 NOTE — Telephone Encounter (Signed)
Please return call to patient at [phone], regarding pain in left thigh over the last 3 days.

## 2011-08-27 NOTE — Telephone Encounter (Signed)
Spoke with pt and he will come in for appt at 3:30 tomorrow with Dr. Clifton James

## 2011-08-27 NOTE — Telephone Encounter (Signed)
Spoke with pt who reports left thigh pain for last 3 days. Pain is continuous but increases in intensity at times.  Pain is in front of left thigh just above knee and goes around to back of thigh at times. Pt states pain feels different than leg pain he has had in the past. Patient notes pain is worse when he has been up moving around for awhile. I told pt I would review with Dr. Clifton James

## 2011-08-27 NOTE — Telephone Encounter (Signed)
Pat, I would be glad to see him. I am not sure if this is related to his PAD or something else. See if he can come in tomorrow. Thanks, chris

## 2011-08-28 ENCOUNTER — Ambulatory Visit (INDEPENDENT_AMBULATORY_CARE_PROVIDER_SITE_OTHER): Payer: Medicare Other | Admitting: Cardiovascular Disease

## 2011-08-28 ENCOUNTER — Encounter: Payer: Self-pay | Admitting: Cardiovascular Disease

## 2011-08-28 VITALS — BP 142/64 | HR 56 | Ht 71.0 in | Wt 169.0 lb

## 2011-08-28 DIAGNOSIS — I739 Peripheral vascular disease, unspecified: Secondary | ICD-10-CM

## 2011-08-28 DIAGNOSIS — I2510 Atherosclerotic heart disease of native coronary artery without angina pectoris: Secondary | ICD-10-CM

## 2011-08-28 DIAGNOSIS — I779 Disorder of arteries and arterioles, unspecified: Secondary | ICD-10-CM

## 2011-08-28 NOTE — Patient Instructions (Signed)
Your physician wants you to follow-up in: 6 months. You will receive a reminder letter in the mail two months in advance. If you don't receive a letter, please call our office to schedule the follow-up appointment.  Your physician has requested that you have a carotid duplex. This test is an ultrasound of the carotid arteries in your neck. It looks at blood flow through these arteries that supply the brain with blood. Allow one hour for this exam. There are no restrictions or special instructions.   Your physician has requested that you have a lower  extremity arterial duplex. This test is an ultrasound of the arteries in the legs. It looks at arterial blood flow in the legs.  Allow one hour for Lower  Arterial scans. There are no restrictions or special instructions

## 2011-08-28 NOTE — Assessment & Plan Note (Signed)
He is having left thigh pain and numbness in his right foot. No swelling in either leg. No skin changes or ulcerations. Not exertional. Hand held dopplers during me examination with dopplerable PT pulses bilaterally. I am not sure his leg pain is from PAD. Will get full LE arterial dopplers to assess. If unchanged, he will need to f/u with his back surgeon.

## 2011-08-28 NOTE — Progress Notes (Signed)
History of Present Illness: 71 yo WM with history of CAD s/p CABG, PVD, hyperlipidemia who is here today for evaluation of leg pain. I see him for his cardiac and PV issues.  I initially saw him in March 2011 for evaluation of his severe bilateral lower ext pain. I arranged a CT angiogram which showed disease in the ostium of the right common iliac and serial lesions in the right SFA and a total occlusion of the left SFA. We proceeded with a distal aortogram with bilateral lower ext runoff. I was able to open his left SFA chronic total occlusion and placed a stent in May 2011. I then staged intervention of his right SFA severe stenosis and treated this with a [place] atherectomy device in June 2011. Follow up ABI normal range in July 2012. Stent in left SFA was patent.  He called our office yesterday with c/o pain in his left thight and numbness in his right leg. He tells me that his right leg has tingling extending from the ball of his foot to his hip. This occurs at rest and worsens with ambulation. Has been occurring for one month. His left thigh has been hurting on the medial and lateral aspects for 5 days. No swelling. No chest pain, SOB. He continues to smoke.   His primary care is Dr. Posey Rea.   Past Medical History  Diagnosis Date  . CAD (coronary artery disease)   . History of transient ischemic attack (TIA)   . Hyperlipidemia   . Peripheral neuropathy   . PVD (peripheral vascular disease) with claudication   . Carotid bruit     bilateral  . Preoperative examination   . Bronchitis, acute   . Ventral hernia   . Personal history of prostate cancer   . Diverticulosis   . ALLERGIC RHINITIS   . Osteoarthritis   . Low back pain   . COPD (chronic obstructive pulmonary disease)   . Tobacco use disorder, continuous     Past Surgical History  Procedure Date  . Coronary artery bypass graft 1992  . Lumbar laminectomy [id number]     X2  . Prostatectomy 05/2008    Dr. Laverle Patter and  XRT  . Appendectomy   . Tonsillectomy     Current Outpatient Prescriptions  Medication Sig Dispense Refill  . amLODipine (NORVASC) 5 MG tablet Take 1 tablet (5 mg total) by mouth daily.  90 tablet  3  . aspirin 81 MG tablet Take 81 mg by mouth daily.        . Cholecalciferol (VITAMIN D3) 1000 UNIT capsule Take 1,000 Units by mouth daily.        Marland Kitchen ezetimibe-simvastatin (VYTORIN) 10-20 MG per tablet Take 1 tablet by mouth every other day.  15 tablet  8  . isosorbide mononitrate (IMDUR) 30 MG 24 hr tablet Take 1 tablet (30 mg total) by mouth daily.  30 tablet  11  . loratadine (CLARITIN) 10 MG tablet Take 10 mg by mouth daily as needed. For allergies       . LORazepam (ATIVAN) 0.5 MG tablet Take 0.5 mg by mouth at bedtime.        . metoprolol (LOPRESSOR) 50 MG tablet Take 25 mg by mouth daily.        . Multiple Vitamin (MULTIVITAMIN) tablet Take 1 tablet by mouth daily.        Marland Kitchen omeprazole (PRILOSEC) 20 MG capsule Take 20 mg by mouth daily.        Marland Kitchen  Probiotic Product (PROBIOTIC FORMULA PO) Take 3 tablets by mouth daily.      . traMADol (ULTRAM) 50 MG tablet Take 50 mg by mouth as needed.        . triamcinolone (KENALOG) 0.5 % cream Apply topically 3 (three) times daily.  60 g  0    Allergies  Allergen Reactions  . Codeine Sulfate     History   Social History  . Marital Status: Married    Spouse Name: N/A    Number of Children: N/A  . Years of Education: N/A   Occupational History  . Retired-self employed    Social History Main Topics  . Smoking status: Current Everyday Smoker -- 40 years    Types: Cigarettes  . Smokeless tobacco: Not on file  . Alcohol Use: No  . Drug Use: No  . Sexually Active: Yes   Other Topics Concern  . Not on file   Social History Narrative  . No narrative on file    Family History  Problem Relation Age of Onset  . Hypertension    . Cancer Mother     Colon  . Heart disease Father   . Coronary artery disease Father   . Cancer Sister   .  Heart disease Brother     Review of Systems:  As stated in the HPI and otherwise negative.   BP 142/64  Pulse 56  Ht 5\' 11"  (1.803 m)  Wt 169 lb (76.658 kg)  BMI 23.57 kg/m2  Physical Examination: General: Well developed, well nourished, NAD HEENT: OP clear, mucus membranes moist SKIN: warm, dry. No rashes. Neuro: No focal deficits Musculoskeletal: Muscle strength 5/5 all ext Psychiatric: Mood and affect normal Neck: No JVD, no carotid bruits, no thyromegaly, no lymphadenopathy. Lungs:Clear bilaterally, no wheezes, rhonci, crackles Cardiovascular: Regular rate and rhythm. No murmurs, gallops or rubs. Abdomen:Soft. Bowel sounds present. Non-tender.  Extremities: No lower extremity edema. Pulses are 2 + in the bilateral PT, non-palpable bilateral DP.

## 2011-09-10 ENCOUNTER — Encounter (INDEPENDENT_AMBULATORY_CARE_PROVIDER_SITE_OTHER): Payer: Medicare Other

## 2011-09-10 DIAGNOSIS — I7389 Other specified peripheral vascular diseases: Secondary | ICD-10-CM

## 2011-09-10 DIAGNOSIS — I739 Peripheral vascular disease, unspecified: Secondary | ICD-10-CM

## 2011-09-10 DIAGNOSIS — I70219 Atherosclerosis of native arteries of extremities with intermittent claudication, unspecified extremity: Secondary | ICD-10-CM | POA: Diagnosis not present

## 2011-09-12 ENCOUNTER — Encounter (INDEPENDENT_AMBULATORY_CARE_PROVIDER_SITE_OTHER): Payer: Medicare Other

## 2011-09-12 DIAGNOSIS — I6529 Occlusion and stenosis of unspecified carotid artery: Secondary | ICD-10-CM

## 2011-09-12 DIAGNOSIS — R0989 Other specified symptoms and signs involving the circulatory and respiratory systems: Secondary | ICD-10-CM | POA: Diagnosis not present

## 2011-09-12 DIAGNOSIS — I7389 Other specified peripheral vascular diseases: Secondary | ICD-10-CM

## 2011-09-12 DIAGNOSIS — I2510 Atherosclerotic heart disease of native coronary artery without angina pectoris: Secondary | ICD-10-CM

## 2011-11-14 ENCOUNTER — Encounter: Payer: Self-pay | Admitting: Internal Medicine

## 2011-11-14 ENCOUNTER — Ambulatory Visit (INDEPENDENT_AMBULATORY_CARE_PROVIDER_SITE_OTHER): Payer: Medicare Other | Admitting: Internal Medicine

## 2011-11-14 VITALS — BP 130/66 | HR 76 | Temp 98.7°F | Resp 16 | Wt 171.0 lb

## 2011-11-14 DIAGNOSIS — L570 Actinic keratosis: Secondary | ICD-10-CM | POA: Diagnosis not present

## 2011-11-14 DIAGNOSIS — M545 Low back pain: Secondary | ICD-10-CM | POA: Diagnosis not present

## 2011-11-14 DIAGNOSIS — R209 Unspecified disturbances of skin sensation: Secondary | ICD-10-CM | POA: Diagnosis not present

## 2011-11-14 DIAGNOSIS — I2510 Atherosclerotic heart disease of native coronary artery without angina pectoris: Secondary | ICD-10-CM | POA: Diagnosis not present

## 2011-11-14 DIAGNOSIS — R202 Paresthesia of skin: Secondary | ICD-10-CM

## 2011-11-14 DIAGNOSIS — J449 Chronic obstructive pulmonary disease, unspecified: Secondary | ICD-10-CM

## 2011-11-14 NOTE — Assessment & Plan Note (Signed)
See cryo 

## 2011-11-14 NOTE — Assessment & Plan Note (Signed)
Continue with current prescription therapy as reflected on the Med list.  

## 2011-11-14 NOTE — Assessment & Plan Note (Signed)
Stable

## 2011-11-14 NOTE — Progress Notes (Deleted)
Patient ID: [name], male   DOB: 1941-02-22, 71 y.o.   MRN: [id number] Patient ID: [name], male   DOB: 19-Jul-1940, 71 y.o.   MRN: [id number]  Subjective:    Patient ID: [name], male    DOB: October 17, 1940, 71 y.o.   MRN: [id number]  HPI  The patient presents for a follow-up of  chronic hypertension, chronic dyslipidemia, PVD, COPD controlled with medicines. C/o sinusitis sx's  x 2-3 wks  Wt Readings from Last 3 Encounters:  11/14/11 171 lb (77.565 kg)  08/28/11 169 lb (76.658 kg)  07/18/11 166 lb (75.297 kg)   BP Readings from Last 3 Encounters:  11/14/11 130/66  08/28/11 142/64  07/18/11 130/72        Review of Systems  Constitutional: Negative for appetite change, fatigue and unexpected weight change.  HENT: Positive for nosebleeds, congestion and rhinorrhea. Negative for sore throat, sneezing, trouble swallowing and neck pain.   Eyes: Negative for itching and visual disturbance.  Respiratory: Positive for cough.   Cardiovascular: Negative for chest pain, palpitations and leg swelling.  Gastrointestinal: Negative for nausea, diarrhea, blood in stool and abdominal distention.  Genitourinary: Negative for frequency and hematuria.  Musculoskeletal: Positive for back pain, arthralgias and gait problem. Negative for joint swelling.  Skin: Negative for rash.  Neurological: Negative for dizziness, tremors, speech difficulty and weakness.  Psychiatric/Behavioral: Negative for disturbed wake/sleep cycle, dysphoric mood and agitation. The patient is not nervous/anxious.        Objective:   Physical Exam  Constitutional: He is oriented to person, place, and time. He appears well-developed.  HENT:  Mouth/Throat: Oropharynx is clear and moist.       Swollen nasal mucosa  Eyes: Conjunctivae are normal. Pupils are equal, round, and reactive to light.  Neck: Normal range of motion. No JVD present. No thyromegaly present.  Cardiovascular: Normal rate,  regular rhythm, normal heart sounds and intact distal pulses.  Exam reveals no gallop and no friction rub.   No murmur heard. Pulmonary/Chest: Effort normal and breath sounds normal. No respiratory distress. He has no wheezes. He has no rales. He exhibits no tenderness.  Abdominal: Soft. Bowel sounds are normal. He exhibits no distension and no mass. There is no tenderness. There is no rebound and no guarding.  Musculoskeletal: Normal range of motion. He exhibits tenderness. He exhibits no edema.       cane  Lymphadenopathy:    He has no cervical adenopathy.  Neurological: He is alert and oriented to person, place, and time. He has normal reflexes. No cranial nerve deficit. He exhibits normal muscle tone. Coordination normal.  Skin: Skin is warm and dry. No rash noted.  Psychiatric: He has a normal mood and affect. His behavior is normal. Judgment and thought content normal.   Lab Results  Component Value Date   WBC 6.3 09/20/2010   HGB 14.2 09/20/2010   HCT 41.0 09/20/2010   PLT 218.0 09/20/2010   GLUCOSE 110* 04/12/2011   CHOL 140 04/12/2011   TRIG 104.0 04/12/2011   HDL 41.40 04/12/2011   LDLCALC 78 04/12/2011   ALT 14 04/12/2011   AST 17 04/12/2011   NA 139 04/12/2011   K 4.7 04/12/2011   CL 103 04/12/2011   CREATININE 0.9 04/12/2011   BUN 13 04/12/2011   CO2 29 04/12/2011   TSH 1.14 04/12/2011   PSA 0.00* 02/16/2010   INR 1.0 ratio 09/12/2009   HGBA1C 5.7 07/11/2006  Assessment & Plan:

## 2011-11-14 NOTE — Progress Notes (Signed)
Subjective:    Patient ID: [name], male    DOB: 12-Oct-1940, 71 y.o.   MRN: [id number]  HPI  The patient presents for a follow-up of  chronic hypertension, chronic dyslipidemia, PVD, COPD controlled with medicines. C/o pain in LS spine, legs 6/10 C/o skin lesions on face  Wt Readings from Last 3 Encounters:  11/14/11 171 lb (77.565 kg)  08/28/11 169 lb (76.658 kg)  07/18/11 166 lb (75.297 kg)   BP Readings from Last 3 Encounters:  11/14/11 130/66  08/28/11 142/64  07/18/11 130/72        Review of Systems  Constitutional: Negative for appetite change, fatigue and unexpected weight change.  HENT: Positive for nosebleeds, congestion and rhinorrhea. Negative for sore throat, sneezing, trouble swallowing and neck pain.   Eyes: Negative for itching and visual disturbance.  Respiratory: Positive for cough.   Cardiovascular: Negative for chest pain, palpitations and leg swelling.  Gastrointestinal: Negative for nausea, diarrhea, blood in stool and abdominal distention.  Genitourinary: Negative for frequency and hematuria.  Musculoskeletal: Positive for back pain, arthralgias and gait problem. Negative for joint swelling.  Skin: Negative for rash.  Neurological: Negative for dizziness, tremors, speech difficulty and weakness.  Psychiatric/Behavioral: Negative for disturbed wake/sleep cycle, dysphoric mood and agitation. The patient is not nervous/anxious.        Objective:   Physical Exam  Constitutional: He is oriented to person, place, and time. He appears well-developed.  HENT:  Mouth/Throat: Oropharynx is clear and moist.       Swollen nasal mucosa  Eyes: Conjunctivae are normal. Pupils are equal, round, and reactive to light.  Neck: Normal range of motion. No JVD present. No thyromegaly present.  Cardiovascular: Normal rate, regular rhythm, normal heart sounds and intact distal pulses.  Exam reveals no gallop and no friction rub.   No murmur  heard. Pulmonary/Chest: Effort normal and breath sounds normal. No respiratory distress. He has no wheezes. He has no rales. He exhibits no tenderness.  Abdominal: Soft. Bowel sounds are normal. He exhibits no distension and no mass. There is no tenderness. There is no rebound and no guarding.  Musculoskeletal: Normal range of motion. He exhibits tenderness. He exhibits no edema.       cane  Lymphadenopathy:    He has no cervical adenopathy.  Neurological: He is alert and oriented to person, place, and time. He has normal reflexes. No cranial nerve deficit. He exhibits normal muscle tone. Coordination normal.  Skin: Skin is warm and dry. No rash noted.  Psychiatric: He has a normal mood and affect. His behavior is normal. Judgment and thought content normal.  AKs  Lab Results  Component Value Date   WBC 6.3 09/20/2010   HGB 14.2 09/20/2010   HCT 41.0 09/20/2010   PLT 218.0 09/20/2010   GLUCOSE 110* 04/12/2011   CHOL 140 04/12/2011   TRIG 104.0 04/12/2011   HDL 41.40 04/12/2011   LDLCALC 78 04/12/2011   ALT 14 04/12/2011   AST 17 04/12/2011   NA 139 04/12/2011   K 4.7 04/12/2011   CL 103 04/12/2011   CREATININE 0.9 04/12/2011   BUN 13 04/12/2011   CO2 29 04/12/2011   TSH 1.14 04/12/2011   PSA 0.00* 02/16/2010   INR 1.0 ratio 09/12/2009   HGBA1C 5.7 07/11/2006     Procedure Note :     Procedure : Cryosurgery   Indication: Actinic keratosis(es)   Risks including unsuccessful procedure , bleeding, infection, bruising, scar, a need for  a repeat  procedure and others were explained to the patient in detail as well as the benefits. Informed consent was obtained verbally.    7 lesion(s)  On face.    was/were treated with liquid nitrogen on a Q-tip in a usual fasion . Band-Aid was applied and antibiotic ointment was given for a later use.   Tolerated well. Complications none.   Postprocedure instructions :     Keep the wounds clean. You can wash them with liquid soap and water. Pat dry with gauze or a Kleenex  tissue  Before applying antibiotic ointment and a Band-Aid.   You need to report immediately  if  any signs of infection develop.         Assessment & Plan:

## 2011-11-19 ENCOUNTER — Other Ambulatory Visit (INDEPENDENT_AMBULATORY_CARE_PROVIDER_SITE_OTHER): Payer: Medicare Other

## 2011-11-19 DIAGNOSIS — K5900 Constipation, unspecified: Secondary | ICD-10-CM

## 2011-11-19 DIAGNOSIS — R202 Paresthesia of skin: Secondary | ICD-10-CM

## 2011-11-19 DIAGNOSIS — E785 Hyperlipidemia, unspecified: Secondary | ICD-10-CM | POA: Diagnosis not present

## 2011-11-19 DIAGNOSIS — R209 Unspecified disturbances of skin sensation: Secondary | ICD-10-CM

## 2011-11-19 DIAGNOSIS — M545 Low back pain: Secondary | ICD-10-CM | POA: Diagnosis not present

## 2011-11-19 DIAGNOSIS — I2510 Atherosclerotic heart disease of native coronary artery without angina pectoris: Secondary | ICD-10-CM | POA: Diagnosis not present

## 2011-11-19 DIAGNOSIS — Z8679 Personal history of other diseases of the circulatory system: Secondary | ICD-10-CM

## 2011-11-19 DIAGNOSIS — I7389 Other specified peripheral vascular diseases: Secondary | ICD-10-CM

## 2011-11-19 LAB — LIPID PANEL: VLDL: 22.2 mg/dL (ref 0.0–40.0)

## 2011-11-19 LAB — HEPATIC FUNCTION PANEL
ALT: 17 U/L (ref 0–53)
AST: 19 U/L (ref 0–37)
Albumin: 4.1 g/dL (ref 3.5–5.2)
Alkaline Phosphatase: 70 U/L (ref 39–117)
Total Protein: 7.2 g/dL (ref 6.0–8.3)

## 2011-11-19 LAB — BASIC METABOLIC PANEL
CO2: 30 mEq/L (ref 19–32)
Chloride: 102 mEq/L (ref 96–112)
GFR: 95.23 mL/min (ref >60.00)
Glucose, Bld: 101 mg/dL — ABNORMAL HIGH (ref 70–99)
Potassium: 4.2 mEq/L (ref 3.5–5.1)
Sodium: 138 mEq/L (ref 135–145)

## 2011-11-19 LAB — VITAMIN B12: Vitamin B-12: 561 pg/mL (ref 211–911)

## 2011-11-19 LAB — SEDIMENTATION RATE: Sed Rate: 5 mm/h (ref 0–22)

## 2011-11-20 ENCOUNTER — Telehealth: Payer: Self-pay | Admitting: Internal Medicine

## 2011-11-20 NOTE — Telephone Encounter (Signed)
[name], please, inform patient that all labs are normal  Thx

## 2011-11-20 NOTE — Telephone Encounter (Signed)
Patient notified

## 2011-11-26 DIAGNOSIS — M5430 Sciatica, unspecified side: Secondary | ICD-10-CM | POA: Diagnosis not present

## 2011-11-26 DIAGNOSIS — M545 Low back pain, unspecified: Secondary | ICD-10-CM | POA: Diagnosis not present

## 2011-11-27 DIAGNOSIS — C61 Malignant neoplasm of prostate: Secondary | ICD-10-CM | POA: Diagnosis not present

## 2011-12-04 DIAGNOSIS — M5137 Other intervertebral disc degeneration, lumbosacral region: Secondary | ICD-10-CM | POA: Diagnosis not present

## 2011-12-04 DIAGNOSIS — C61 Malignant neoplasm of prostate: Secondary | ICD-10-CM | POA: Diagnosis not present

## 2011-12-13 DIAGNOSIS — IMO0002 Reserved for concepts with insufficient information to code with codable children: Secondary | ICD-10-CM | POA: Diagnosis not present

## 2011-12-13 DIAGNOSIS — M545 Low back pain: Secondary | ICD-10-CM | POA: Diagnosis not present

## 2011-12-13 DIAGNOSIS — M48061 Spinal stenosis, lumbar region without neurogenic claudication: Secondary | ICD-10-CM | POA: Diagnosis not present

## 2011-12-13 DIAGNOSIS — M47817 Spondylosis without myelopathy or radiculopathy, lumbosacral region: Secondary | ICD-10-CM | POA: Diagnosis not present

## 2011-12-26 DIAGNOSIS — H2510 Age-related nuclear cataract, unspecified eye: Secondary | ICD-10-CM | POA: Diagnosis not present

## 2011-12-26 DIAGNOSIS — H33109 Unspecified retinoschisis, unspecified eye: Secondary | ICD-10-CM | POA: Diagnosis not present

## 2011-12-26 DIAGNOSIS — IMO0002 Reserved for concepts with insufficient information to code with codable children: Secondary | ICD-10-CM | POA: Insufficient documentation

## 2011-12-26 DIAGNOSIS — Z961 Presence of intraocular lens: Secondary | ICD-10-CM | POA: Diagnosis not present

## 2011-12-28 DIAGNOSIS — H33109 Unspecified retinoschisis, unspecified eye: Secondary | ICD-10-CM | POA: Diagnosis not present

## 2012-01-01 DIAGNOSIS — M545 Low back pain: Secondary | ICD-10-CM | POA: Diagnosis not present

## 2012-01-01 DIAGNOSIS — M48061 Spinal stenosis, lumbar region without neurogenic claudication: Secondary | ICD-10-CM | POA: Diagnosis not present

## 2012-01-01 DIAGNOSIS — M47817 Spondylosis without myelopathy or radiculopathy, lumbosacral region: Secondary | ICD-10-CM | POA: Diagnosis not present

## 2012-01-21 DIAGNOSIS — M48061 Spinal stenosis, lumbar region without neurogenic claudication: Secondary | ICD-10-CM | POA: Diagnosis not present

## 2012-01-21 DIAGNOSIS — M47817 Spondylosis without myelopathy or radiculopathy, lumbosacral region: Secondary | ICD-10-CM | POA: Diagnosis not present

## 2012-01-25 ENCOUNTER — Telehealth: Payer: Self-pay | Admitting: Cardiovascular Disease

## 2012-01-25 MED ORDER — EZETIMIBE-SIMVASTATIN 10-20 MG PO TABS: 1.0000 | ORAL_TABLET | ORAL | Status: DC

## 2012-01-25 NOTE — Telephone Encounter (Signed)
Pt needs refill of vytorin 10-20 mcg [facility] day [facility] with 1 year refills

## 2012-02-12 DIAGNOSIS — M47817 Spondylosis without myelopathy or radiculopathy, lumbosacral region: Secondary | ICD-10-CM | POA: Diagnosis not present

## 2012-02-12 DIAGNOSIS — IMO0002 Reserved for concepts with insufficient information to code with codable children: Secondary | ICD-10-CM | POA: Diagnosis not present

## 2012-02-12 DIAGNOSIS — M545 Low back pain: Secondary | ICD-10-CM | POA: Diagnosis not present

## 2012-02-12 DIAGNOSIS — M48061 Spinal stenosis, lumbar region without neurogenic claudication: Secondary | ICD-10-CM | POA: Diagnosis not present

## 2012-02-18 ENCOUNTER — Ambulatory Visit: Payer: Medicare Other | Admitting: Internal Medicine

## 2012-02-20 ENCOUNTER — Encounter: Payer: Self-pay | Admitting: Internal Medicine

## 2012-02-20 ENCOUNTER — Ambulatory Visit (INDEPENDENT_AMBULATORY_CARE_PROVIDER_SITE_OTHER): Payer: Medicare Other | Admitting: Internal Medicine

## 2012-02-20 ENCOUNTER — Other Ambulatory Visit (INDEPENDENT_AMBULATORY_CARE_PROVIDER_SITE_OTHER): Payer: Medicare Other

## 2012-02-20 VITALS — BP 118/60 | HR 57 | Temp 97.0°F | Wt 167.5 lb

## 2012-02-20 DIAGNOSIS — G609 Hereditary and idiopathic neuropathy, unspecified: Secondary | ICD-10-CM | POA: Diagnosis not present

## 2012-02-20 DIAGNOSIS — M545 Low back pain, unspecified: Secondary | ICD-10-CM

## 2012-02-20 DIAGNOSIS — M1990 Unspecified osteoarthritis, unspecified site: Secondary | ICD-10-CM | POA: Diagnosis not present

## 2012-02-20 DIAGNOSIS — Z23 Encounter for immunization: Secondary | ICD-10-CM

## 2012-02-20 DIAGNOSIS — R972 Elevated prostate specific antigen [PSA]: Secondary | ICD-10-CM

## 2012-02-20 DIAGNOSIS — T148XXA Other injury of unspecified body region, initial encounter: Secondary | ICD-10-CM

## 2012-02-20 DIAGNOSIS — I2510 Atherosclerotic heart disease of native coronary artery without angina pectoris: Secondary | ICD-10-CM

## 2012-02-20 LAB — CBC WITH DIFFERENTIAL/PLATELET
Basophils Absolute: 0.2 10*3/uL — ABNORMAL HIGH (ref 0.0–0.1)
Eosinophils Absolute: 0.2 10*3/uL (ref 0.0–0.7)
HCT: 45.5 % (ref 39.0–52.0)
Lymphs Abs: 1.4 10*3/uL (ref 0.7–4.0)
MCHC: 33.1 g/dL (ref 30.0–36.0)
MCV: 101.2 fl — ABNORMAL HIGH (ref 78.0–100.0)
Monocytes Absolute: 0.8 10*3/uL (ref 0.1–1.0)
Platelets: 271 10*3/uL (ref 150.0–400.0)
RDW: 13.1 % (ref 11.5–14.6)

## 2012-02-20 NOTE — Assessment & Plan Note (Signed)
Chronic Spinal stenosis S/p back surgery x 2 B LE pain Continue with current prescription therapy as reflected on the Med list (Tramadol, Tylenol)

## 2012-02-20 NOTE — Assessment & Plan Note (Signed)
Continue with current prescription therapy as reflected on the Med list.  

## 2012-02-20 NOTE — Assessment & Plan Note (Signed)
Tramadol prn 

## 2012-02-20 NOTE — Assessment & Plan Note (Signed)
Vit C CBC

## 2012-02-20 NOTE — Progress Notes (Signed)
   Subjective:    Patient ID: [name], male    DOB: [birth year], 71 y.o.   MRN: [id number]  HPI  The patient presents for a follow-up of  chronic hypertension, chronic dyslipidemia, PVD, COPD controlled with medicines. C/o pain in LS spine, legs 6/10   Wt Readings from Last 3 Encounters:  02/20/12 167 lb 8 oz (75.978 kg)  11/14/11 171 lb (77.565 kg)  08/28/11 169 lb (76.658 kg)   BP Readings from Last 3 Encounters:  02/20/12 118/60  11/14/11 130/66  08/28/11 142/64        Review of Systems  Constitutional: Negative for appetite change, fatigue and unexpected weight change.  HENT: Positive for nosebleeds, congestion and rhinorrhea. Negative for sore throat, sneezing, trouble swallowing and neck pain.   Eyes: Negative for itching and visual disturbance.  Respiratory: Positive for cough.   Cardiovascular: Negative for chest pain, palpitations and leg swelling.  Gastrointestinal: Negative for nausea, diarrhea, blood in stool and abdominal distention.  Genitourinary: Negative for frequency and hematuria.  Musculoskeletal: Positive for back pain, arthralgias and gait problem. Negative for joint swelling.  Skin: Negative for rash.  Neurological: Negative for dizziness, tremors, speech difficulty and weakness.  Psychiatric/Behavioral: Negative for sleep disturbance, dysphoric mood and agitation. The patient is not nervous/anxious.        Objective:   Physical Exam  Constitutional: He is oriented to person, place, and time. He appears well-developed.  HENT:  Mouth/Throat: Oropharynx is clear and moist.       Swollen nasal mucosa  Eyes: Conjunctivae normal are normal. Pupils are equal, round, and reactive to light.  Neck: Normal range of motion. No JVD present. No thyromegaly present.  Cardiovascular: Normal rate, regular rhythm, normal heart sounds and intact distal pulses.  Exam reveals no gallop and no friction rub.   No murmur heard. Pulmonary/Chest: Effort  normal and breath sounds normal. No respiratory distress. He has no wheezes. He has no rales. He exhibits no tenderness.  Abdominal: Soft. Bowel sounds are normal. He exhibits no distension and no mass. There is no tenderness. There is no rebound and no guarding.  Musculoskeletal: Normal range of motion. He exhibits tenderness. He exhibits no edema.       cane  Lymphadenopathy:    He has no cervical adenopathy.  Neurological: He is alert and oriented to person, place, and time. He has normal reflexes. No cranial nerve deficit. He exhibits normal muscle tone. Coordination normal.  Skin: Skin is warm and dry. No rash noted.  Psychiatric: He has a normal mood and affect. His behavior is normal. Judgment and thought content normal.  bruises   Lab Results  Component Value Date   WBC 6.3 09/20/2010   HGB 14.2 09/20/2010   HCT 41.0 09/20/2010   PLT 218.0 09/20/2010   GLUCOSE 101* 11/19/2011   CHOL 149 11/19/2011   TRIG 111.0 11/19/2011   HDL 44.30 11/19/2011   LDLCALC 83 11/19/2011   ALT 17 11/19/2011   AST 19 11/19/2011   NA 138 11/19/2011   K 4.2 11/19/2011   CL 102 11/19/2011   CREATININE 0.8 11/19/2011   BUN 12 11/19/2011   CO2 30 11/19/2011   TSH 1.14 04/12/2011   PSA 0.00* 02/16/2010   INR 1.0 ratio 09/12/2009   HGBA1C 5.7 07/11/2006         Assessment & Plan:

## 2012-02-20 NOTE — Assessment & Plan Note (Signed)
Watching  

## 2012-02-21 LAB — PSA: PSA: 0.01 ng/mL — ABNORMAL LOW (ref 0.10–4.00)

## 2012-03-04 DIAGNOSIS — M545 Low back pain, unspecified: Secondary | ICD-10-CM | POA: Diagnosis not present

## 2012-03-04 DIAGNOSIS — M48061 Spinal stenosis, lumbar region without neurogenic claudication: Secondary | ICD-10-CM | POA: Diagnosis not present

## 2012-04-11 ENCOUNTER — Other Ambulatory Visit: Payer: Self-pay | Admitting: Cardiovascular Disease

## 2012-04-17 ENCOUNTER — Other Ambulatory Visit: Payer: Self-pay | Admitting: Cardiovascular Disease

## 2012-04-22 ENCOUNTER — Telehealth: Payer: Self-pay | Admitting: Cardiovascular Disease

## 2012-04-22 NOTE — Telephone Encounter (Signed)
Both RXs was sent into pharmacy. Pt notified.

## 2012-04-22 NOTE — Telephone Encounter (Signed)
Pt needs refill on amlodipine 5mg  qd and Imdur 30 mg qd  [facility]

## 2012-05-26 ENCOUNTER — Encounter: Payer: Self-pay | Admitting: Internal Medicine

## 2012-05-26 ENCOUNTER — Ambulatory Visit (INDEPENDENT_AMBULATORY_CARE_PROVIDER_SITE_OTHER): Payer: Medicare Other | Admitting: Internal Medicine

## 2012-05-26 VITALS — BP 142/80 | HR 80 | Temp 98.0°F | Resp 16 | Wt 177.0 lb

## 2012-05-26 DIAGNOSIS — T148XXA Other injury of unspecified body region, initial encounter: Secondary | ICD-10-CM | POA: Diagnosis not present

## 2012-05-26 DIAGNOSIS — L570 Actinic keratosis: Secondary | ICD-10-CM | POA: Diagnosis not present

## 2012-05-26 DIAGNOSIS — L259 Unspecified contact dermatitis, unspecified cause: Secondary | ICD-10-CM

## 2012-05-26 DIAGNOSIS — I2510 Atherosclerotic heart disease of native coronary artery without angina pectoris: Secondary | ICD-10-CM | POA: Diagnosis not present

## 2012-05-26 DIAGNOSIS — M1990 Unspecified osteoarthritis, unspecified site: Secondary | ICD-10-CM

## 2012-05-26 DIAGNOSIS — I739 Peripheral vascular disease, unspecified: Secondary | ICD-10-CM | POA: Diagnosis not present

## 2012-05-26 DIAGNOSIS — L309 Dermatitis, unspecified: Secondary | ICD-10-CM

## 2012-05-26 DIAGNOSIS — M545 Low back pain: Secondary | ICD-10-CM

## 2012-05-26 MED ORDER — TRIAMCINOLONE ACETONIDE 0.5 % EX CREA: TOPICAL_CREAM | Freq: Three times a day (TID) | CUTANEOUS | Status: DC

## 2012-05-26 NOTE — Assessment & Plan Note (Signed)
Continue with current prescription therapy as reflected on the Med list.  

## 2012-05-26 NOTE — Progress Notes (Signed)
Patient ID: [name], male   DOB: Aug 31, 1940, 72 y.o.   MRN: [id number]   Subjective:    Patient ID: [name], male    DOB: 1940/11/09, 72 y.o.   MRN: [id number]  HPI  The patient presents for a follow-up of  chronic hypertension, chronic dyslipidemia, PVD, COPD controlled with medicines. C/o pain in LS spine, legs 6/10 C/o rash on B palms x wk C/o skin lesions   Wt Readings from Last 3 Encounters:  05/26/12 177 lb (80.287 kg)  02/20/12 167 lb 8 oz (75.978 kg)  11/14/11 171 lb (77.565 kg)   BP Readings from Last 3 Encounters:  05/26/12 142/80  02/20/12 118/60  11/14/11 130/66        Review of Systems  Constitutional: Negative for appetite change, fatigue and unexpected weight change.  HENT: Positive for nosebleeds, congestion and rhinorrhea. Negative for sore throat, sneezing, trouble swallowing and neck pain.   Eyes: Negative for itching and visual disturbance.  Respiratory: Positive for cough.   Cardiovascular: Negative for chest pain, palpitations and leg swelling.  Gastrointestinal: Negative for nausea, diarrhea, blood in stool and abdominal distention.  Genitourinary: Negative for frequency and hematuria.  Musculoskeletal: Positive for back pain, arthralgias and gait problem. Negative for joint swelling.  Skin: Negative for rash.  Neurological: Negative for dizziness, tremors, speech difficulty and weakness.  Psychiatric/Behavioral: Negative for sleep disturbance, dysphoric mood and agitation. The patient is not nervous/anxious.        Objective:   Physical Exam  Constitutional: He is oriented to person, place, and time. He appears well-developed.  HENT:  Mouth/Throat: Oropharynx is clear and moist.  Swollen nasal mucosa  Eyes: Conjunctivae are normal. Pupils are equal, round, and reactive to light.  Neck: Normal range of motion. No JVD present. No thyromegaly present.  Cardiovascular: Normal rate, regular rhythm, normal heart sounds and  intact distal pulses.  Exam reveals no gallop and no friction rub.   No murmur heard. Pulmonary/Chest: Effort normal and breath sounds normal. No respiratory distress. He has no wheezes. He has no rales. He exhibits no tenderness.  Abdominal: Soft. Bowel sounds are normal. He exhibits no distension and no mass. There is no tenderness. There is no rebound and no guarding.  Musculoskeletal: Normal range of motion. He exhibits tenderness. He exhibits no edema.  cane  Lymphadenopathy:    He has no cervical adenopathy.  Neurological: He is alert and oriented to person, place, and time. He has normal reflexes. No cranial nerve deficit. He exhibits normal muscle tone. Coordination normal.  Skin: Skin is warm and dry. No rash noted.  Psychiatric: He has a normal mood and affect. His behavior is normal. Judgment and thought content normal.  bruises  AKs Eczema on B palms  Lab Results  Component Value Date   WBC 8.8 02/20/2012   HGB 15.0 02/20/2012   HCT 45.5 02/20/2012   PLT 271.0 02/20/2012   GLUCOSE 101* 11/19/2011   CHOL 149 11/19/2011   TRIG 111.0 11/19/2011   HDL 44.30 11/19/2011   LDLCALC 83 11/19/2011   ALT 17 11/19/2011   AST 19 11/19/2011   NA 138 11/19/2011   K 4.2 11/19/2011   CL 102 11/19/2011   CREATININE 0.8 11/19/2011   BUN 12 11/19/2011   CO2 30 11/19/2011   TSH 1.14 04/12/2011   PSA 0.01* 02/20/2012   INR 1.0 02/20/2012   HGBA1C 5.7 07/11/2006         Assessment & Plan:

## 2012-05-26 NOTE — Assessment & Plan Note (Signed)
2/14 Triamc bid Try gluten free diet

## 2012-05-26 NOTE — Progress Notes (Signed)
  Subjective:    Patient ID: [name], male    DOB: 1940-06-18, 72 y.o.   MRN: [id number]  HPI    Review of Systems     Objective:   Physical Exam   Procedure Note :     Procedure : Cryosurgery   Indication:   Actinic keratosis(es)   Risks including unsuccessful procedure , bleeding, infection, bruising, scar, a need for a repeat  procedure and others were explained to the patient in detail as well as the benefits. Informed consent was obtained verbally.   3  lesion(s)  on  trunk  was/were treated with liquid nitrogen on a Q-tip in a usual fasion . Band-Aid was applied and antibiotic ointment was given for a later use.   Tolerated well. Complications none.   Postprocedure instructions :     Keep the wounds clean. You can wash them with liquid soap and water. Pat dry with gauze or a Kleenex tissue  Before applying antibiotic ointment and a Band-Aid.   You need to report immediately  if  any signs of infection develop.         Assessment & Plan:

## 2012-05-26 NOTE — Assessment & Plan Note (Signed)
Vit C

## 2012-05-26 NOTE — Assessment & Plan Note (Signed)
Smoking discussed 

## 2012-05-26 NOTE — Patient Instructions (Signed)
Try gluten free diet x 1 month (for hand rash)

## 2012-06-17 ENCOUNTER — Other Ambulatory Visit: Payer: Self-pay

## 2012-06-17 ENCOUNTER — Telehealth: Payer: Self-pay | Admitting: Cardiovascular Disease

## 2012-06-17 DIAGNOSIS — C61 Malignant neoplasm of prostate: Secondary | ICD-10-CM | POA: Diagnosis not present

## 2012-06-17 MED ORDER — ISOSORBIDE MONONITRATE ER 30 MG PO TB24: ORAL_TABLET | ORAL | Status: DC

## 2012-06-17 MED ORDER — AMLODIPINE BESYLATE 5 MG PO TABS: ORAL_TABLET | ORAL | Status: DC

## 2012-06-17 MED ORDER — ISOSORBIDE MONONITRATE ER 30 MG PO TB24: 30.0000 mg | ORAL_TABLET | Freq: Every day | ORAL | Status: DC

## 2012-06-17 NOTE — Telephone Encounter (Signed)
Amlodipine and [facility]orbide [facility] day supply with refills

## 2012-06-24 DIAGNOSIS — C61 Malignant neoplasm of prostate: Secondary | ICD-10-CM | POA: Diagnosis not present

## 2012-06-24 DIAGNOSIS — N529 Male erectile dysfunction, unspecified: Secondary | ICD-10-CM | POA: Diagnosis not present

## 2012-06-26 ENCOUNTER — Telehealth: Payer: Self-pay | Admitting: Cardiovascular Disease

## 2012-06-26 ENCOUNTER — Telehealth: Payer: Self-pay | Admitting: *Deleted

## 2012-06-26 DIAGNOSIS — E785 Hyperlipidemia, unspecified: Secondary | ICD-10-CM

## 2012-06-26 MED ORDER — AMLODIPINE BESYLATE 5 MG PO TABS: ORAL_TABLET | ORAL | Status: DC

## 2012-06-26 MED ORDER — ISOSORBIDE MONONITRATE ER 30 MG PO TB24: ORAL_TABLET | ORAL | Status: DC

## 2012-06-26 NOTE — Telephone Encounter (Signed)
Refilled as requested. Form for Vytorin will be given to [facility] RN so Dr Clifton James can review for possible change.

## 2012-06-26 NOTE — Telephone Encounter (Signed)
Walk In pt Form " Pt Needs Refill" Sent to Msg Nurse  06/26/12/KM

## 2012-06-30 DIAGNOSIS — H33109 Unspecified retinoschisis, unspecified eye: Secondary | ICD-10-CM | POA: Diagnosis not present

## 2012-06-30 DIAGNOSIS — H2510 Age-related nuclear cataract, unspecified eye: Secondary | ICD-10-CM | POA: Diagnosis not present

## 2012-06-30 DIAGNOSIS — Z961 Presence of intraocular lens: Secondary | ICD-10-CM | POA: Diagnosis not present

## 2012-06-30 NOTE — Telephone Encounter (Signed)
Spoke with pt. He has been on Vytorin for many years. He was on something in the past but does not remember name of it. I told him I would contact his insurance regarding prior authorization. He is due for follow up with Dr. Clifton James. Appt made for August 05, 2012 at 3:00.

## 2012-07-03 NOTE — Telephone Encounter (Signed)
After reviewing form it is not for prior authorization but notification that Vytorin is changing to a tier 3 medication. I called pt's insurance to see if pt took simvastatin and Zetia separately how much the cost would be for him.  Zetia is also moving to a tier 3.  Will review with Dr. Clifton James to see if pt can change to a cheaper generic. Covered options are atorvastatin, simvastatin, pravastatin, fluvastatin, lovastatin.

## 2012-07-04 MED ORDER — SIMVASTATIN 20 MG PO TABS: 20.0000 mg | ORAL_TABLET | Freq: Every day | ORAL | Status: DC

## 2012-07-04 NOTE — Telephone Encounter (Signed)
Spoke with pt. He will finish the Vytorin that he has (about 20 pills) and then stop and change to Simvastatin 20 mg daily. He has been taking Vytorin every other day for many years due to leg pain when taking daily.  He will start simvastatin daily and change to every other day if he develops leg pain.  He is aware he will need fasting lab work in late June.  This will be arranged at appt with Dr. Clifton James on August 05, 2012. [facility]

## 2012-07-04 NOTE — Telephone Encounter (Signed)
We could d/c vytorin and either try simvastatin 20 mg po QHS with f/u lipids in 12 weeks or we could stop vytorin and change to atorvastatin 20 mg po QHS which will be stronger and repeat lipids in 12 weeks. chris

## 2012-08-05 ENCOUNTER — Encounter: Payer: Self-pay | Admitting: Cardiovascular Disease

## 2012-08-05 ENCOUNTER — Ambulatory Visit (INDEPENDENT_AMBULATORY_CARE_PROVIDER_SITE_OTHER): Payer: Medicare Other | Admitting: Cardiovascular Disease

## 2012-08-05 VITALS — BP 118/66 | HR 57 | Ht 71.5 in | Wt 171.4 lb

## 2012-08-05 DIAGNOSIS — I2510 Atherosclerotic heart disease of native coronary artery without angina pectoris: Secondary | ICD-10-CM | POA: Diagnosis not present

## 2012-08-05 DIAGNOSIS — I739 Peripheral vascular disease, unspecified: Secondary | ICD-10-CM

## 2012-08-05 NOTE — Patient Instructions (Addendum)
Your physician wants you to follow-up in: `12 months.   You will receive a reminder letter in the mail two months in advance. If you don't receive a letter, please call our office to schedule the follow-up appointment.  Your physician has requested that you have a lexiscan myoview. For further information please visit [email or web address] Please follow instruction sheet, as given.  Your physician has requested that you have an ankle brachial index (ABI). During this test an ultrasound and blood pressure cuff are used to evaluate the arteries that supply the arms and legs with blood. Allow thirty minutes for this exam. There are no restrictions or special instructions. To be done in June 2014  Your physician has requested that you have a carotid duplex. This test is an ultrasound of the carotid arteries in your neck. It looks at blood flow through these arteries that supply the brain with blood. Allow one hour for this exam. There are no restrictions or special instructions. To be done in June 2014  Your physician recommends that you return for fasting lab work in:  August 2014--Lipid and Liver profile

## 2012-08-05 NOTE — Progress Notes (Signed)
History of Present Illness: 72 yo WM with history of CAD s/p CABG 1993, PVD, hyperlipidemia who is here today for cardiac and PV follow up. I initially saw him in March 2011 for evaluation of his severe bilateral lower ext pain. I arranged a CT angiogram which showed disease in the ostium of the right common iliac and serial lesions in the right SFA and a total occlusion of the left SFA. We proceeded with a distal aortogram with bilateral lower ext runoff. I was able to open his left SFA chronic total occlusion and placed a stent in May 2011. I then staged intervention of his right SFA severe stenosis and treated this with a [place] atherectomy device in June 2011. He was seen for left leg pain and right leg numbness in May 2013. ABI June 2013 normal. Dopplers demonstrated patency of left SFA stent and patency of right SFA atherectomy site. Carotid artery dopplers June 2013 with 40-59% RICA stenosis, 0-39% LICA stenosis. His last cath was in 1999 and his vein graft to the Circumflex was occluded at that time. (no report can be found).  He is here today for follow up. No lower ext swelling. No chest pain, SOB. He continues to smoke. His legs do not hurt but he has numbness in both legs and feet. He has chronic back pain and weakness. He has refused repeat back surgery.   Primary Care Physician: Dr. Posey Rea.  Last Lipid Profile:Lipid Panel     Component Value Date/Time   CHOL 149 11/19/2011 0958   TRIG 111.0 11/19/2011 0958   HDL 44.30 11/19/2011 0958   CHOLHDL 3 11/19/2011 0958   VLDL 22.2 11/19/2011 0958   LDLCALC 83 11/19/2011 0958     Past Medical History  Diagnosis Date  . CAD (coronary artery disease)   . History of transient ischemic attack (TIA)   . Hyperlipidemia   . Peripheral neuropathy   . PVD (peripheral vascular disease) with claudication   . Carotid bruit     bilateral  . Preoperative examination   . Bronchitis, acute   . Ventral hernia   . Personal history of prostate  cancer   . Diverticulosis   . ALLERGIC RHINITIS   . Osteoarthritis   . Low back pain   . COPD (chronic obstructive pulmonary disease)   . Tobacco use disorder, continuous     Past Surgical History  Procedure Laterality Date  . Coronary artery bypass graft  1992  . Lumbar laminectomy  [id number]     X2  . Prostatectomy  05/2008    Dr. Laverle Patter and XRT  . Appendectomy    . Tonsillectomy      Current Outpatient Prescriptions  Medication Sig Dispense Refill  . amLODipine (NORVASC) 5 MG tablet TAKE 1 TABLET BY MOUTH DAILY  90 tablet  0  . aspirin 81 MG tablet Take 81 mg by mouth daily.        . Cholecalciferol (VITAMIN D3) 1000 UNIT capsule Take 1,000 Units by mouth daily.        . isosorbide mononitrate (IMDUR) 30 MG 24 hr tablet TAKE 1 TABLET BY MOUTH DAILY  90 tablet  0  . loratadine (CLARITIN) 10 MG tablet Take 10 mg by mouth daily as needed. For allergies       . LORazepam (ATIVAN) 0.5 MG tablet Take 0.5 mg by mouth at bedtime.        . metoprolol (LOPRESSOR) 50 MG tablet Take 25 mg by mouth daily.        Marland Kitchen  Multiple Vitamin (MULTIVITAMIN) tablet Take 1 tablet by mouth daily.        Marland Kitchen omeprazole (PRILOSEC) 20 MG capsule Take 20 mg by mouth daily.        . Probiotic Product (PROBIOTIC FORMULA PO) Take 3 tablets by mouth daily.      . simvastatin (ZOCOR) 20 MG tablet Take 1 tablet (20 mg total) by mouth at bedtime.  90 tablet  3  . traMADol (ULTRAM) 50 MG tablet Take 50 mg by mouth as needed.        . triamcinolone cream (KENALOG) 0.5 % Apply topically 3 (three) times daily.  60 g  1   No current facility-administered medications for this visit.    Allergies  Allergen Reactions  . Codeine Sulfate     History   Social History  . Marital Status: Married    Spouse Name: N/A    Number of Children: N/A  . Years of Education: N/A   Occupational History  . Retired-self employed    Social History Main Topics  . Smoking status: Current Every Day Smoker -- 40 years    Types:  Cigarettes  . Smokeless tobacco: Not on file  . Alcohol Use: No  . Drug Use: No  . Sexually Active: Yes   Other Topics Concern  . Not on file   Social History Narrative  . No narrative on file    Family History  Problem Relation Age of Onset  . Hypertension    . Cancer Mother     Colon  . Heart disease Father   . Coronary artery disease Father   . Cancer Sister   . Heart disease Brother     Review of Systems:  As stated in the HPI and otherwise negative.   BP 118/66  Pulse 57  Ht 5' 11.5" (1.816 m)  Wt 171 lb 6.4 oz (77.747 kg)  BMI 23.58 kg/m2  Physical Examination: General: Well developed, well nourished, NAD HEENT: OP clear, mucus membranes moist SKIN: warm, dry. No rashes. Neuro: No focal deficits Musculoskeletal: Muscle strength 5/5 all ext Psychiatric: Mood and affect normal Neck: No JVD, no carotid bruits, no thyromegaly, no lymphadenopathy. Lungs:Clear bilaterally, no wheezes, rhonci, crackles Cardiovascular: Regular rate and rhythm. No murmurs, gallops or rubs. Abdomen:Soft. Bowel sounds present. Non-tender.  Extremities: No lower extremity edema. Pulses are 1-2 + in the bilateral DP/PT.  Assessment and Plan:   1. CAD: Stable. He needs an update on his stress test since he has severe CAD and has had CABG in 1993. Will arrange Lexiscan stress myoview to exclude ischemia.   2. PAD: Stable. Needs updating of his ABI and carotid artery dopplers this summer.

## 2012-08-15 ENCOUNTER — Telehealth: Payer: Self-pay | Admitting: Cardiovascular Disease

## 2012-08-15 NOTE — Telephone Encounter (Signed)
New problem   Pt has question concerning his test he is having 08/18/12. Please call

## 2012-08-18 ENCOUNTER — Ambulatory Visit (HOSPITAL_COMMUNITY): Payer: Medicare Other | Attending: Cardiology | Admitting: Radiology

## 2012-08-18 VITALS — BP 98/58 | Ht 73.0 in | Wt 166.0 lb

## 2012-08-18 DIAGNOSIS — R0602 Shortness of breath: Secondary | ICD-10-CM | POA: Diagnosis not present

## 2012-08-18 DIAGNOSIS — R0609 Other forms of dyspnea: Secondary | ICD-10-CM | POA: Insufficient documentation

## 2012-08-18 DIAGNOSIS — Z8249 Family history of ischemic heart disease and other diseases of the circulatory system: Secondary | ICD-10-CM | POA: Diagnosis not present

## 2012-08-18 DIAGNOSIS — I779 Disorder of arteries and arterioles, unspecified: Secondary | ICD-10-CM | POA: Insufficient documentation

## 2012-08-18 DIAGNOSIS — E785 Hyperlipidemia, unspecified: Secondary | ICD-10-CM | POA: Diagnosis not present

## 2012-08-18 DIAGNOSIS — R5381 Other malaise: Secondary | ICD-10-CM | POA: Insufficient documentation

## 2012-08-18 DIAGNOSIS — F17200 Nicotine dependence, unspecified, uncomplicated: Secondary | ICD-10-CM | POA: Insufficient documentation

## 2012-08-18 DIAGNOSIS — Z9861 Coronary angioplasty status: Secondary | ICD-10-CM | POA: Diagnosis not present

## 2012-08-18 DIAGNOSIS — I739 Peripheral vascular disease, unspecified: Secondary | ICD-10-CM | POA: Insufficient documentation

## 2012-08-18 DIAGNOSIS — R0989 Other specified symptoms and signs involving the circulatory and respiratory systems: Secondary | ICD-10-CM | POA: Insufficient documentation

## 2012-08-18 DIAGNOSIS — I2510 Atherosclerotic heart disease of native coronary artery without angina pectoris: Secondary | ICD-10-CM

## 2012-08-18 DIAGNOSIS — Z8673 Personal history of transient ischemic attack (TIA), and cerebral infarction without residual deficits: Secondary | ICD-10-CM | POA: Diagnosis not present

## 2012-08-18 DIAGNOSIS — J449 Chronic obstructive pulmonary disease, unspecified: Secondary | ICD-10-CM | POA: Insufficient documentation

## 2012-08-18 DIAGNOSIS — J4489 Other specified chronic obstructive pulmonary disease: Secondary | ICD-10-CM | POA: Insufficient documentation

## 2012-08-18 MED ORDER — REGADENOSON 0.4 MG/5ML IV SOLN
0.4000 mg | Freq: Once | INTRAVENOUS | Status: AC
  Administered 2012-08-18: 0.4 mg via INTRAVENOUS

## 2012-08-18 MED ORDER — TECHNETIUM TC 99M SESTAMIBI GENERIC - CARDIOLITE
30.0000 | Freq: Once | INTRAVENOUS | Status: AC | PRN
  Administered 2012-08-18: 30 via INTRAVENOUS

## 2012-08-18 MED ORDER — TECHNETIUM TC 99M SESTAMIBI GENERIC - CARDIOLITE
10.0000 | Freq: Once | INTRAVENOUS | Status: AC | PRN
  Administered 2012-08-18: 10 via INTRAVENOUS

## 2012-08-18 NOTE — Progress Notes (Signed)
[facility] SITE 3 NUCLEAR MED [address] [place], [place] [address] [phone]    Cardiology Nuclear Med Study  [name] is a 72 y.o. male     MRN : [id number]     DOB: March 18, 1941  Procedure Date: 08/18/2012  Nuclear Med Background Indication for Stress Test:  Evaluation for Ischemia, Graft Patency and PTCA Patency History:  COPD and '88 Angioplasty, '90 Angioplasty, '93 CABG,'99 Heart Cath: SVG-CFX occluded (no report) other grafts patent  per pt 2010 ECHO: EF: 55-60% -MPS EF: 68% Cardiac Risk Factors: Carotid Disease, Claudication, Family History - CAD, Lipids, PVD, Smoker and TIA  Symptoms:  DOE, Fatigue and SOB   Nuclear Pre-Procedure Caffeine/Decaff Intake:  9:00pm NPO After: 9:00pm   Lungs:  clear O2 Sat: 99% on room air. IV 0.9% NS with Angio Cath:  22g  IV Site: R Hand  IV Started by:  Doyne Keel, CNMT  Chest Size (in):  42 Cup Size: n/a  Height: 6\' 1"  (1.854 m)  Weight:  166 lb (75.297 kg)  BMI:  Body mass index is 21.91 kg/(m^2). Tech Comments:  Lopressor taken @ 0630    Nuclear Med Study 1 or 2 day study: 1 day  Stress Test Type:  Lexiscan  Reading MD: Olga Millers, MD  Order Authorizing Provider:  Tedra Senegal  Resting Radionuclide: Technetium 46m Sestamibi  Resting Radionuclide Dose: 11.0 mCi   Stress Radionuclide:  Technetium 7m Sestamibi  Stress Radionuclide Dose: 33.0 mCi           Stress Protocol Rest HR: 50 Stress HR: 74  Rest BP: 98/58 Stress BP: 109/48  Exercise Time (min): n/a METS: n/a   Predicted Max HR: 147 bpm % Max HR: 50.34 bpm Rate Pressure Product: 8066   Dose of Adenosine (mg):  n/a Dose of Lexiscan: 0.4 mg  Dose of Atropine (mg): n/a Dose of Dobutamine: n/a mcg/kg/min (at max HR)  Stress Test Technologist: Milana Na, EMT-P  Nuclear Technologist:  Domenic Polite, CNMT     Rest Procedure:  Myocardial perfusion imaging was performed at rest 45 minutes following the intravenous  administration of Technetium 82m Sestamibi. Rest ECG: Marked sinus bradycardia with no ST changes  Stress Procedure:  The patient received IV Lexiscan 0.4 mg over 15-seconds.  Technetium 48m Sestamibi injected at 30-seconds. This patient had sob, cough arms tingling, and his head felt funny. Quantitative spect images were obtained after a 45 minute delay. Stress ECG: No significant change from baseline ECG  QPS Raw Data Images:  Acquisition technically good; normal left ventricular size. Stress Images:  Decreased uptake in the inferolateral wall. Rest Images:  Normal homogeneous uptake in all areas of the myocardium. Subtraction (SDS):  These findings are consistent with ischemia. Transient Ischemic Dilatation (Normal <1.22):  1.13 Lung/Heart Ratio (Normal <0.45):  0.25  Quantitative Gated Spect Images QGS EDV:  93 ml QGS ESV:  32 ml  Impression Exercise Capacity:  Lexiscan with no exercise. BP Response:  Normal blood pressure response. Clinical Symptoms:  There is dyspnea. ECG Impression:  No significant ST segment change suggestive of ischemia. Comparison with Prior Nuclear Study: No images to compare  Overall Impression:  Low risk stress nuclear study with a small, moderate intensity, reversible inferolateral defect consistent with mild ischemia..  LV Ejection Fraction: 66%.  LV Wall Motion:  NL LV Function; NL Wall Motion  Olga Millers

## 2012-08-25 ENCOUNTER — Ambulatory Visit (INDEPENDENT_AMBULATORY_CARE_PROVIDER_SITE_OTHER): Payer: Medicare Other | Admitting: Internal Medicine

## 2012-08-25 ENCOUNTER — Other Ambulatory Visit (INDEPENDENT_AMBULATORY_CARE_PROVIDER_SITE_OTHER): Payer: Medicare Other

## 2012-08-25 ENCOUNTER — Encounter: Payer: Self-pay | Admitting: Internal Medicine

## 2012-08-25 VITALS — BP 124/68 | HR 80 | Temp 98.5°F | Resp 16 | Wt 170.0 lb

## 2012-08-25 DIAGNOSIS — M545 Low back pain: Secondary | ICD-10-CM

## 2012-08-25 DIAGNOSIS — I2510 Atherosclerotic heart disease of native coronary artery without angina pectoris: Secondary | ICD-10-CM | POA: Diagnosis not present

## 2012-08-25 DIAGNOSIS — J449 Chronic obstructive pulmonary disease, unspecified: Secondary | ICD-10-CM

## 2012-08-25 DIAGNOSIS — Z8679 Personal history of other diseases of the circulatory system: Secondary | ICD-10-CM

## 2012-08-25 DIAGNOSIS — M1990 Unspecified osteoarthritis, unspecified site: Secondary | ICD-10-CM | POA: Diagnosis not present

## 2012-08-25 LAB — BASIC METABOLIC PANEL
BUN: 11 mg/dL (ref 6–23)
Chloride: 99 mEq/L (ref 96–112)
Potassium: 4.7 mEq/L (ref 3.5–5.1)

## 2012-08-25 NOTE — Assessment & Plan Note (Signed)
Continue with current prescription therapy as reflected on the Med list.  

## 2012-08-25 NOTE — Assessment & Plan Note (Signed)
No relapse 

## 2012-08-25 NOTE — Progress Notes (Signed)
   Subjective:    HPI  The patient presents for a follow-up of  chronic hypertension, chronic dyslipidemia, PVD, COPD controlled with medicines. C/o pain in LS spine, legs 6-7/10 worse lately C/o rash on B palms x wk C/o skin lesions   Wt Readings from Last 3 Encounters:  08/25/12 170 lb (77.111 kg)  08/18/12 166 lb (75.297 kg)  08/05/12 171 lb 6.4 oz (77.747 kg)   BP Readings from Last 3 Encounters:  08/25/12 124/68  08/18/12 98/58  08/05/12 118/66        Review of Systems  Constitutional: Negative for appetite change, fatigue and unexpected weight change.  HENT: Positive for nosebleeds, congestion and rhinorrhea. Negative for sore throat, sneezing, trouble swallowing and neck pain.   Eyes: Negative for itching and visual disturbance.  Respiratory: Positive for cough.   Cardiovascular: Negative for chest pain, palpitations and leg swelling.  Gastrointestinal: Negative for nausea, diarrhea, blood in stool and abdominal distention.  Genitourinary: Negative for frequency and hematuria.  Musculoskeletal: Positive for back pain, arthralgias and gait problem. Negative for joint swelling.  Skin: Negative for rash.  Neurological: Negative for dizziness, tremors, speech difficulty and weakness.  Psychiatric/Behavioral: Negative for sleep disturbance, dysphoric mood and agitation. The patient is not nervous/anxious.        Objective:   Physical Exam  Constitutional: He is oriented to person, place, and time. He appears well-developed.  HENT:  Mouth/Throat: Oropharynx is clear and moist.  Swollen nasal mucosa  Eyes: Conjunctivae are normal. Pupils are equal, round, and reactive to light.  Neck: Normal range of motion. No JVD present. No thyromegaly present.  Cardiovascular: Normal rate, regular rhythm, normal heart sounds and intact distal pulses.  Exam reveals no gallop and no friction rub.   No murmur heard. Pulmonary/Chest: Effort normal and breath sounds normal. No  respiratory distress. He has no wheezes. He has no rales. He exhibits no tenderness.  Abdominal: Soft. Bowel sounds are normal. He exhibits no distension and no mass. There is no tenderness. There is no rebound and no guarding.  Musculoskeletal: Normal range of motion. He exhibits tenderness. He exhibits no edema.  cane  Lymphadenopathy:    He has no cervical adenopathy.  Neurological: He is alert and oriented to person, place, and time. He has normal reflexes. No cranial nerve deficit. He exhibits normal muscle tone. Coordination normal.  Skin: Skin is warm and dry. No rash noted.  Psychiatric: He has a normal mood and affect. His behavior is normal. Judgment and thought content normal.  less bruises  AKs Eczema on B palms  Lab Results  Component Value Date   WBC 8.8 02/20/2012   HGB 15.0 02/20/2012   HCT 45.5 02/20/2012   PLT 271.0 02/20/2012   GLUCOSE 79 08/25/2012   CHOL 149 11/19/2011   TRIG 111.0 11/19/2011   HDL 44.30 11/19/2011   LDLCALC 83 11/19/2011   ALT 17 11/19/2011   AST 19 11/19/2011   NA 136 08/25/2012   K 4.7 08/25/2012   CL 99 08/25/2012   CREATININE 0.9 08/25/2012   BUN 11 08/25/2012   CO2 31 08/25/2012   TSH 1.14 04/12/2011   PSA 0.01* 02/20/2012   INR 1.0 02/20/2012   HGBA1C 5.7 07/11/2006         Assessment & Plan:

## 2012-08-26 ENCOUNTER — Other Ambulatory Visit: Payer: Self-pay

## 2012-08-26 MED ORDER — AMLODIPINE BESYLATE 5 MG PO TABS: ORAL_TABLET | ORAL | Status: DC

## 2012-08-26 MED ORDER — ISOSORBIDE MONONITRATE ER 30 MG PO TB24: ORAL_TABLET | ORAL | Status: DC

## 2012-09-15 ENCOUNTER — Encounter (INDEPENDENT_AMBULATORY_CARE_PROVIDER_SITE_OTHER): Payer: Medicare Other

## 2012-09-15 DIAGNOSIS — I70219 Atherosclerosis of native arteries of extremities with intermittent claudication, unspecified extremity: Secondary | ICD-10-CM

## 2012-09-15 DIAGNOSIS — I739 Peripheral vascular disease, unspecified: Secondary | ICD-10-CM | POA: Diagnosis not present

## 2012-09-24 ENCOUNTER — Encounter (INDEPENDENT_AMBULATORY_CARE_PROVIDER_SITE_OTHER): Payer: Medicare Other

## 2012-09-24 DIAGNOSIS — I2510 Atherosclerotic heart disease of native coronary artery without angina pectoris: Secondary | ICD-10-CM

## 2012-09-24 DIAGNOSIS — I6529 Occlusion and stenosis of unspecified carotid artery: Secondary | ICD-10-CM

## 2012-09-24 DIAGNOSIS — I739 Peripheral vascular disease, unspecified: Secondary | ICD-10-CM

## 2012-10-26 ENCOUNTER — Other Ambulatory Visit: Payer: Self-pay | Admitting: Cardiovascular Disease

## 2012-11-19 ENCOUNTER — Other Ambulatory Visit (INDEPENDENT_AMBULATORY_CARE_PROVIDER_SITE_OTHER): Payer: Medicare Other

## 2012-11-19 DIAGNOSIS — I2510 Atherosclerotic heart disease of native coronary artery without angina pectoris: Secondary | ICD-10-CM | POA: Diagnosis not present

## 2012-11-19 LAB — HEPATIC FUNCTION PANEL
Alkaline Phosphatase: 68 U/L (ref 39–117)
Bilirubin, Direct: 0.1 mg/dL (ref 0.0–0.3)
Total Protein: 6.9 g/dL (ref 6.0–8.3)

## 2012-11-19 LAB — LIPID PANEL
Cholesterol: 146 mg/dL (ref 0–200)
LDL Cholesterol: 89 mg/dL (ref 0–99)

## 2012-11-27 ENCOUNTER — Emergency Department (HOSPITAL_COMMUNITY)
Admission: EM | Admit: 2012-11-27 | Discharge: 2012-11-27 | Disposition: A | Discharge status: Home / Self Care | Payer: Medicare Other | Attending: Emergency Medicine | Admitting: Emergency Medicine

## 2012-11-27 ENCOUNTER — Encounter (HOSPITAL_COMMUNITY): Payer: Self-pay | Admitting: Emergency Medicine

## 2012-11-27 DIAGNOSIS — Z79899 Other long term (current) drug therapy: Secondary | ICD-10-CM | POA: Diagnosis not present

## 2012-11-27 DIAGNOSIS — J449 Chronic obstructive pulmonary disease, unspecified: Secondary | ICD-10-CM | POA: Diagnosis not present

## 2012-11-27 DIAGNOSIS — F17200 Nicotine dependence, unspecified, uncomplicated: Secondary | ICD-10-CM | POA: Diagnosis not present

## 2012-11-27 DIAGNOSIS — I2510 Atherosclerotic heart disease of native coronary artery without angina pectoris: Secondary | ICD-10-CM | POA: Insufficient documentation

## 2012-11-27 DIAGNOSIS — J4489 Other specified chronic obstructive pulmonary disease: Secondary | ICD-10-CM | POA: Insufficient documentation

## 2012-11-27 DIAGNOSIS — Z7982 Long term (current) use of aspirin: Secondary | ICD-10-CM | POA: Insufficient documentation

## 2012-11-27 DIAGNOSIS — Y9389 Activity, other specified: Secondary | ICD-10-CM | POA: Insufficient documentation

## 2012-11-27 DIAGNOSIS — S61209A Unspecified open wound of unspecified finger without damage to nail, initial encounter: Secondary | ICD-10-CM | POA: Diagnosis not present

## 2012-11-27 DIAGNOSIS — E785 Hyperlipidemia, unspecified: Secondary | ICD-10-CM | POA: Insufficient documentation

## 2012-11-27 DIAGNOSIS — W260XXA Contact with knife, initial encounter: Secondary | ICD-10-CM | POA: Insufficient documentation

## 2012-11-27 DIAGNOSIS — Y929 Unspecified place or not applicable: Secondary | ICD-10-CM | POA: Insufficient documentation

## 2012-11-27 DIAGNOSIS — S61219A Laceration without foreign body of unspecified finger without damage to nail, initial encounter: Secondary | ICD-10-CM

## 2012-11-27 NOTE — ED Notes (Signed)
Bacitracin and bandage placed on L pointer finger

## 2012-11-27 NOTE — ED Notes (Signed)
Pt presenting to ed with c/o cutting his left index finger with his pocket knife

## 2012-11-27 NOTE — ED Provider Notes (Signed)
CSN: [id number]     Arrival date & time 11/27/12  1449 History    This chart was scribed for Nathan Sinning, PA, working with Gerhard Munch, MD by Blanchard Kelch, ED Scribe. This patient was seen in room WTR9/WTR9 and the patient's care was started at 3:17 PM.     Chief Complaint  Patient presents with  . Finger Injury    Patient is a 72 y.o. male presenting with skin laceration. The history is provided by the patient. No language interpreter was used.  Laceration Location:  Hand Hand laceration location:  L hand Length (cm):  2 Quality: straight   Bleeding: controlled   Time since incident:  2 hours Laceration mechanism:  Knife Foreign body present:  No foreign bodies Tetanus status:  Up to date   HPI Comments: [name] is a 72 y.o. male who presents to the Emergency Department complaining of 2 cm laceration on left index finger via his pocket knife that occurred 1.5 hours ago. Bleeding is controlled by a bandage applied by patient. Patient states he takes aspirin but denies taking other anticoagulants. Patient reports having a tetanus shot vaccination within the last year. Patient denies numbness, or tingling.    Past Medical History  Diagnosis Date  . CAD (coronary artery disease)   . History of transient ischemic attack (TIA)   . Hyperlipidemia   . Peripheral neuropathy   . PVD (peripheral vascular disease) with claudication   . Carotid bruit     bilateral  . Preoperative examination   . Bronchitis, acute   . Ventral hernia   . Personal history of prostate cancer   . Diverticulosis   . ALLERGIC RHINITIS   . Osteoarthritis   . Low back pain   . COPD (chronic obstructive pulmonary disease)   . Tobacco use disorder, continuous    Past Surgical History  Procedure Laterality Date  . Coronary artery bypass graft  1992  . Lumbar laminectomy  [id number]     X2  . Prostatectomy  05/2008    Dr. Laverle Patter and XRT  . Appendectomy    . Tonsillectomy      Family History  Problem Relation Age of Onset  . Hypertension    . Cancer Mother     Colon  . Heart disease Father   . Coronary artery disease Father   . Cancer Sister   . Heart disease Brother    History  Substance Use Topics  . Smoking status: Current Every Day Smoker -- 40 years    Types: Cigarettes  . Smokeless tobacco: Not on file  . Alcohol Use: No    Review of Systems  Skin: Positive for wound (laceration, left index finger).  Neurological: Negative for weakness and numbness.       Denies tingling.  All other systems reviewed and are negative.    Allergies  Codeine sulfate  Home Medications   Current Outpatient Rx  Name  Route  Sig  Dispense  Refill  . amLODipine (NORVASC) 5 MG tablet      TAKE 1 TABLET DAILY   90 tablet   2   . aspirin 81 MG tablet   Oral   Take 81 mg by mouth daily.           . Cholecalciferol (VITAMIN D3) 1000 UNIT capsule   Oral   Take 1,000 Units by mouth daily.           . isosorbide mononitrate (IMDUR) 30 MG 24  hr tablet      TAKE 1 TABLET DAILY   90 tablet   2   . loratadine (CLARITIN) 10 MG tablet   Oral   Take 10 mg by mouth daily as needed. For allergies          . LORazepam (ATIVAN) 0.5 MG tablet   Oral   Take 0.5 mg by mouth at bedtime.           . metoprolol (LOPRESSOR) 50 MG tablet   Oral   Take 25 mg by mouth daily.           . Multiple Vitamin (MULTIVITAMIN) tablet   Oral   Take 1 tablet by mouth daily.           Marland Kitchen omeprazole (PRILOSEC) 20 MG capsule   Oral   Take 20 mg by mouth daily.           . Probiotic Product (PROBIOTIC FORMULA PO)   Oral   Take 3 tablets by mouth daily.         . simvastatin (ZOCOR) 20 MG tablet   Oral   Take 1 tablet (20 mg total) by mouth at bedtime.   90 tablet   3   . traMADol (ULTRAM) 50 MG tablet   Oral   Take 50 mg by mouth as needed.           . triamcinolone cream (KENALOG) 0.5 %   Topical   Apply topically 3 (three) times daily.    60 g   1    Triage Vitals: BP 123/47  Pulse 67  Temp(Src) 98.9 F (37.2 C) (Oral)  Resp 20  SpO2 96%  Physical Exam  Nursing note and vitals reviewed. Constitutional: He is oriented to person, place, and time. He appears well-developed and well-nourished.  HENT:  Head: Normocephalic and atraumatic.  Eyes: Conjunctivae and EOM are normal.  Neck: Normal range of motion. Neck supple. No tracheal deviation present.  Cardiovascular: Normal rate and regular rhythm.   Pulmonary/Chest: Effort normal and breath sounds normal. No respiratory distress.  Musculoskeletal: Normal range of motion.  Neurological: He is alert and oriented to person, place, and time.  Skin: Skin is warm and dry.  Good capillary refill of left index finger. 2 cm linear laceration of left index finger pad. Distal sensation of left index finger is intact. Full ROM for DIP, PIP, MCP of the left index finger.  Psychiatric: He has a normal mood and affect.    ED Course   DIAGNOSTIC STUDIES: Oxygen Saturation is 96% on room air, adequate by my interpretation.    COORDINATION OF CARE:  3:20 PM - Discussed plan to repair laceration with patient. Patient verbalizes understanding and agrees with treatment plan.  4:26 PM - Patient verbalizes understanding and agrees with treatment plan.    NERVE BLOCK Date/Time: 11/27/2012 5:01 PM Performed by: Anne Shutter, Nazarene Bunning Authorized by: Anne Shutter, Herbert Seta Consent: Verbal consent obtained. Risks and benefits: risks, benefits and alternatives were discussed Consent given by: patient Patient understanding: patient states understanding of the procedure being performed Patient consent: the patient's understanding of the procedure matches consent given Patient identity confirmed: verbally with patient Indications: pain relief Nerve block body site: index finger. Laterality: left Preparation: Patient was prepped and draped in the usual sterile fashion. Needle gauge: 25  G Location technique: anatomical landmarks Local anesthetic: lidocaine 1% without epinephrine Anesthetic total: 6 ml Outcome: pain improved Patient tolerance: Patient tolerated the procedure well with no  immediate complications.   (including critical care time)  LACERATION REPAIR Performed by: [name] Consent: Verbal consent obtained. Risks and benefits: risks, benefits and alternatives were discussed Patient identity confirmed: provided demographic data Time out performed prior to procedure Prepped and Draped in normal sterile fashion Wound explored Laceration Location: left index finger on pad Laceration Length: 2cm No Foreign Bodies seen or palpated Anesthesia: digital block Local anesthetic: lidocaine 2% without epinephrine Anesthetic total: 6 ml Irrigation method: syringe Amount of cleaning: standard Skin closure: 5-0 prolene stiches Number of sutures or staples: 4 Technique: simple interrupted Patient tolerance: Patient tolerated the procedure well with no immediate complications.   Labs Reviewed - No data to display No results found. No diagnosis found.  MDM  Patient presenting with superficial laceration of his left index finger pad.  Patient neurovascularly intact.  Full ROM of finger.  Tetanus is up to date.  Patient stable for discharge.  I personally performed the services described in this documentation, which was scribed in my presence. The recorded information has been reviewed and is accurate.   Pascal Lux Hissop, PA-C 11/27/12 1702

## 2012-11-28 NOTE — ED Provider Notes (Signed)
  Medical screening examination/treatment/procedure(s) were performed by non-physician practitioner and as supervising physician I was immediately available for consultation/collaboration.    Markhi Kleckner, MD 11/28/12 0013 

## 2012-12-04 ENCOUNTER — Encounter: Payer: Self-pay | Admitting: Internal Medicine

## 2012-12-04 ENCOUNTER — Ambulatory Visit (INDEPENDENT_AMBULATORY_CARE_PROVIDER_SITE_OTHER): Payer: Medicare Other | Admitting: Internal Medicine

## 2012-12-04 VITALS — BP 118/70 | HR 76 | Temp 98.6°F | Resp 16 | Wt 172.0 lb

## 2012-12-04 DIAGNOSIS — S61218D Laceration without foreign body of other finger without damage to nail, subsequent encounter: Secondary | ICD-10-CM

## 2012-12-04 DIAGNOSIS — S61218A Laceration without foreign body of other finger without damage to nail, initial encounter: Secondary | ICD-10-CM | POA: Insufficient documentation

## 2012-12-04 DIAGNOSIS — Z5189 Encounter for other specified aftercare: Secondary | ICD-10-CM

## 2012-12-04 NOTE — Assessment & Plan Note (Signed)
8/14 L 4 sutures on Thur last wk: I do not think the wound is healed well yet... RTC in 3-4 d for suture removal

## 2012-12-04 NOTE — Progress Notes (Signed)
  Subjective:    Patient ID: [name], male    DOB: Jul 13, 1940, 72 y.o.   MRN: [id number]  HPI  F/u finger laceration  Review of Systems  Constitutional: Negative for fever.  Skin: Negative for rash.       Objective:   Physical Exam  2 cm wound on L index distal palmar aspect with 4 sutures I do not think the wound is healed well yet...     Assessment & Plan:

## 2012-12-09 ENCOUNTER — Encounter: Payer: Self-pay | Admitting: Internal Medicine

## 2012-12-09 ENCOUNTER — Ambulatory Visit (INDEPENDENT_AMBULATORY_CARE_PROVIDER_SITE_OTHER): Payer: Medicare Other | Admitting: Internal Medicine

## 2012-12-09 VITALS — BP 120/70 | HR 68 | Temp 98.3°F | Resp 16

## 2012-12-09 DIAGNOSIS — L309 Dermatitis, unspecified: Secondary | ICD-10-CM

## 2012-12-09 DIAGNOSIS — S61218D Laceration without foreign body of other finger without damage to nail, subsequent encounter: Secondary | ICD-10-CM

## 2012-12-09 DIAGNOSIS — L259 Unspecified contact dermatitis, unspecified cause: Secondary | ICD-10-CM

## 2012-12-09 DIAGNOSIS — Z5189 Encounter for other specified aftercare: Secondary | ICD-10-CM

## 2012-12-09 NOTE — Assessment & Plan Note (Signed)
Chronic - worse Derm cons Dr Jorja Loa

## 2012-12-09 NOTE — Progress Notes (Signed)
Patient ID: [name], male   DOB: 12/11/1940, 72 y.o.   MRN: [id number]  Subjective:    Patient ID: [name], male    DOB: October 14, 1940, 72 y.o.   MRN: [id number]  HPI  F/u finger laceration C/o rash on hands - worse  Review of Systems  Constitutional: Negative for fever.  Skin: Positive for rash.       Objective:   Physical Exam  Constitutional: No distress.  Skin: Rash noted. No erythema. No pallor.    2 cm wound on L index distal palmar aspect with 4 sutures  Hand eczema B - palms  Sutures removed    Assessment & Plan:

## 2012-12-09 NOTE — Addendum Note (Signed)
Addended by: Tresa Garter on: 12/09/2012 11:04 AM   Modules accepted: Orders

## 2012-12-09 NOTE — Assessment & Plan Note (Signed)
Sutures removed.

## 2012-12-11 DIAGNOSIS — L301 Dyshidrosis [pompholyx]: Secondary | ICD-10-CM | POA: Diagnosis not present

## 2012-12-11 DIAGNOSIS — L570 Actinic keratosis: Secondary | ICD-10-CM | POA: Diagnosis not present

## 2012-12-24 ENCOUNTER — Ambulatory Visit (INDEPENDENT_AMBULATORY_CARE_PROVIDER_SITE_OTHER): Payer: Medicare Other | Admitting: Internal Medicine

## 2012-12-24 ENCOUNTER — Encounter: Payer: Self-pay | Admitting: Internal Medicine

## 2012-12-24 VITALS — BP 130/64 | HR 60 | Temp 98.4°F | Resp 16 | Wt 171.0 lb

## 2012-12-24 DIAGNOSIS — J449 Chronic obstructive pulmonary disease, unspecified: Secondary | ICD-10-CM | POA: Diagnosis not present

## 2012-12-24 DIAGNOSIS — I2510 Atherosclerotic heart disease of native coronary artery without angina pectoris: Secondary | ICD-10-CM | POA: Diagnosis not present

## 2012-12-24 DIAGNOSIS — C61 Malignant neoplasm of prostate: Secondary | ICD-10-CM | POA: Diagnosis not present

## 2012-12-24 DIAGNOSIS — L259 Unspecified contact dermatitis, unspecified cause: Secondary | ICD-10-CM

## 2012-12-24 DIAGNOSIS — L309 Dermatitis, unspecified: Secondary | ICD-10-CM

## 2012-12-24 DIAGNOSIS — Z23 Encounter for immunization: Secondary | ICD-10-CM

## 2012-12-24 NOTE — Assessment & Plan Note (Signed)
Continue with current prescription therapy as reflected on the Med list.  

## 2012-12-24 NOTE — Assessment & Plan Note (Signed)
Gluten free trial (no wheat products) for 4-6 weeks. OK to use gluten-free bread and gluten-free pasta.  Milk free trial (no milk, ice cream, cheese and yogurt) for 4-6 weeks. OK to use almond, coconut, rice or soy milk. "Almond breeze" brand tastes good.  

## 2012-12-24 NOTE — Progress Notes (Signed)
   Subjective:    HPI  The patient presents for a follow-up of  chronic hypertension, chronic dyslipidemia, PVD, COPD controlled with medicines. F/u pain in LS spine, legs 6-7/10 worse lately F/u rash on B palms x wk F/u skin lesions   Wt Readings from Last 3 Encounters:  12/24/12 171 lb (77.565 kg)  12/04/12 172 lb (78.019 kg)  08/25/12 170 lb (77.111 kg)   BP Readings from Last 3 Encounters:  12/24/12 130/64  12/09/12 120/70  12/04/12 118/70        Review of Systems  Constitutional: Negative for appetite change, fatigue and unexpected weight change.  HENT: Positive for nosebleeds, congestion and rhinorrhea. Negative for sore throat, sneezing, trouble swallowing and neck pain.   Eyes: Negative for itching and visual disturbance.  Respiratory: Positive for cough.   Cardiovascular: Negative for chest pain, palpitations and leg swelling.  Gastrointestinal: Negative for nausea, diarrhea, blood in stool and abdominal distention.  Genitourinary: Negative for frequency and hematuria.  Musculoskeletal: Positive for back pain, arthralgias and gait problem. Negative for joint swelling.  Skin: Negative for rash.  Neurological: Negative for dizziness, tremors, speech difficulty and weakness.  Psychiatric/Behavioral: Negative for sleep disturbance, dysphoric mood and agitation. The patient is not nervous/anxious.        Objective:   Physical Exam  Constitutional: He is oriented to person, place, and time. He appears well-developed.  HENT:  Mouth/Throat: Oropharynx is clear and moist.  Swollen nasal mucosa  Eyes: Conjunctivae are normal. Pupils are equal, round, and reactive to light.  Neck: Normal range of motion. No JVD present. No thyromegaly present.  Cardiovascular: Normal rate, regular rhythm, normal heart sounds and intact distal pulses.  Exam reveals no gallop and no friction rub.   No murmur heard. Pulmonary/Chest: Effort normal and breath sounds normal. No respiratory  distress. He has no wheezes. He has no rales. He exhibits no tenderness.  Abdominal: Soft. Bowel sounds are normal. He exhibits no distension and no mass. There is no tenderness. There is no rebound and no guarding.  Musculoskeletal: Normal range of motion. He exhibits tenderness. He exhibits no edema.  cane  Lymphadenopathy:    He has no cervical adenopathy.  Neurological: He is alert and oriented to person, place, and time. He has normal reflexes. No cranial nerve deficit. He exhibits normal muscle tone. Coordination normal.  Skin: Skin is warm and dry. No rash noted.  Psychiatric: He has a normal mood and affect. His behavior is normal. Judgment and thought content normal.  less bruises  AKs Eczema on B palms  Lab Results  Component Value Date   WBC 8.8 02/20/2012   HGB 15.0 02/20/2012   HCT 45.5 02/20/2012   PLT 271.0 02/20/2012   GLUCOSE 79 08/25/2012   CHOL 146 11/19/2012   TRIG 93.0 11/19/2012   HDL 38.50* 11/19/2012   LDLCALC 89 11/19/2012   ALT 16 11/19/2012   AST 17 11/19/2012   NA 136 08/25/2012   K 4.7 08/25/2012   CL 99 08/25/2012   CREATININE 0.9 08/25/2012   BUN 11 08/25/2012   CO2 31 08/25/2012   TSH 1.14 04/12/2011   PSA 0.01* 02/20/2012   INR 1.0 02/20/2012   HGBA1C 5.7 07/11/2006         Assessment & Plan:

## 2012-12-24 NOTE — Assessment & Plan Note (Signed)
Stable

## 2012-12-24 NOTE — Patient Instructions (Signed)
Gluten free trial (no wheat products) for 4-6 weeks. OK to use gluten-free bread and gluten-free pasta.  Milk free trial (no milk, ice cream, cheese and yogurt) for 4-6 weeks. OK to use almond, coconut, rice or soy milk. "Almond breeze" brand tastes good.  

## 2012-12-31 DIAGNOSIS — R3129 Other microscopic hematuria: Secondary | ICD-10-CM | POA: Diagnosis not present

## 2012-12-31 DIAGNOSIS — C61 Malignant neoplasm of prostate: Secondary | ICD-10-CM | POA: Diagnosis not present

## 2013-01-06 DIAGNOSIS — H2510 Age-related nuclear cataract, unspecified eye: Secondary | ICD-10-CM | POA: Diagnosis not present

## 2013-01-06 DIAGNOSIS — H33109 Unspecified retinoschisis, unspecified eye: Secondary | ICD-10-CM | POA: Diagnosis not present

## 2013-01-06 DIAGNOSIS — Z961 Presence of intraocular lens: Secondary | ICD-10-CM | POA: Diagnosis not present

## 2013-01-15 DIAGNOSIS — M48061 Spinal stenosis, lumbar region without neurogenic claudication: Secondary | ICD-10-CM | POA: Diagnosis not present

## 2013-01-15 DIAGNOSIS — M47817 Spondylosis without myelopathy or radiculopathy, lumbosacral region: Secondary | ICD-10-CM | POA: Diagnosis not present

## 2013-01-15 DIAGNOSIS — IMO0002 Reserved for concepts with insufficient information to code with codable children: Secondary | ICD-10-CM | POA: Diagnosis not present

## 2013-01-15 DIAGNOSIS — M545 Low back pain: Secondary | ICD-10-CM | POA: Diagnosis not present

## 2013-01-22 DIAGNOSIS — M545 Low back pain: Secondary | ICD-10-CM | POA: Diagnosis not present

## 2013-01-22 DIAGNOSIS — M48061 Spinal stenosis, lumbar region without neurogenic claudication: Secondary | ICD-10-CM | POA: Diagnosis not present

## 2013-01-22 DIAGNOSIS — IMO0002 Reserved for concepts with insufficient information to code with codable children: Secondary | ICD-10-CM | POA: Diagnosis not present

## 2013-01-26 DIAGNOSIS — L301 Dyshidrosis [pompholyx]: Secondary | ICD-10-CM | POA: Diagnosis not present

## 2013-03-11 DIAGNOSIS — M48061 Spinal stenosis, lumbar region without neurogenic claudication: Secondary | ICD-10-CM | POA: Diagnosis not present

## 2013-03-11 DIAGNOSIS — M545 Low back pain: Secondary | ICD-10-CM | POA: Diagnosis not present

## 2013-03-25 ENCOUNTER — Encounter: Payer: Self-pay | Admitting: Internal Medicine

## 2013-03-25 ENCOUNTER — Ambulatory Visit (INDEPENDENT_AMBULATORY_CARE_PROVIDER_SITE_OTHER): Payer: Medicare Other | Admitting: Internal Medicine

## 2013-03-25 VITALS — BP 130/68 | Temp 97.5°F | Wt 172.0 lb

## 2013-03-25 DIAGNOSIS — I2510 Atherosclerotic heart disease of native coronary artery without angina pectoris: Secondary | ICD-10-CM

## 2013-03-25 DIAGNOSIS — J449 Chronic obstructive pulmonary disease, unspecified: Secondary | ICD-10-CM

## 2013-03-25 DIAGNOSIS — S8410XA Injury of peroneal nerve at lower leg level, unspecified leg, initial encounter: Secondary | ICD-10-CM | POA: Diagnosis not present

## 2013-03-25 DIAGNOSIS — M79609 Pain in unspecified limb: Secondary | ICD-10-CM | POA: Diagnosis not present

## 2013-03-25 DIAGNOSIS — R209 Unspecified disturbances of skin sensation: Secondary | ICD-10-CM | POA: Diagnosis not present

## 2013-03-25 DIAGNOSIS — G609 Hereditary and idiopathic neuropathy, unspecified: Secondary | ICD-10-CM | POA: Diagnosis not present

## 2013-03-25 DIAGNOSIS — I739 Peripheral vascular disease, unspecified: Secondary | ICD-10-CM

## 2013-03-25 DIAGNOSIS — Z8679 Personal history of other diseases of the circulatory system: Secondary | ICD-10-CM

## 2013-03-25 DIAGNOSIS — N320 Bladder-neck obstruction: Secondary | ICD-10-CM

## 2013-03-25 NOTE — Assessment & Plan Note (Signed)
No relapse 

## 2013-03-25 NOTE — Progress Notes (Signed)
Pre visit review using our clinic review tool, if applicable. No additional management support is needed unless otherwise documented below in the visit note. 

## 2013-03-25 NOTE — Assessment & Plan Note (Signed)
Continue with current prescription therapy as reflected on the Med list.  

## 2013-03-25 NOTE — Assessment & Plan Note (Signed)
He may need a L peroneal nerve release

## 2013-03-25 NOTE — Progress Notes (Signed)
   Subjective:    HPI  The patient presents for a follow-up of  chronic hypertension, chronic dyslipidemia, PVD, COPD controlled with medicines. F/u pain in LS spine, legs 6-7/10 worse lately F/u rash on B palms x wk F/u skin lesions   Wt Readings from Last 3 Encounters:  03/25/13 172 lb (78.019 kg)  12/24/12 171 lb (77.565 kg)  12/04/12 172 lb (78.019 kg)   BP Readings from Last 3 Encounters:  03/25/13 130/68  12/24/12 130/64  12/09/12 120/70     Review of Systems  Constitutional: Negative for appetite change, fatigue and unexpected weight change.  HENT: Positive for congestion, nosebleeds and rhinorrhea. Negative for sneezing, sore throat and trouble swallowing.   Eyes: Negative for itching and visual disturbance.  Respiratory: Positive for cough.   Cardiovascular: Negative for chest pain, palpitations and leg swelling.  Gastrointestinal: Negative for nausea, diarrhea, blood in stool and abdominal distention.  Genitourinary: Negative for frequency and hematuria.  Musculoskeletal: Positive for arthralgias, back pain and gait problem. Negative for joint swelling and neck pain.  Skin: Negative for rash.  Neurological: Negative for dizziness, tremors, speech difficulty and weakness.  Psychiatric/Behavioral: Negative for sleep disturbance, dysphoric mood and agitation. The patient is not nervous/anxious.        Objective:   Physical Exam  Constitutional: He is oriented to person, place, and time. He appears well-developed.  HENT:  Mouth/Throat: Oropharynx is clear and moist.  Swollen nasal mucosa  Eyes: Conjunctivae are normal. Pupils are equal, round, and reactive to light.  Neck: Normal range of motion. No JVD present. No thyromegaly present.  Cardiovascular: Normal rate, regular rhythm, normal heart sounds and intact distal pulses.  Exam reveals no gallop and no friction rub.   No murmur heard. Pulmonary/Chest: Effort normal and breath sounds normal. No respiratory  distress. He has no wheezes. He has no rales. He exhibits no tenderness.  Abdominal: Soft. Bowel sounds are normal. He exhibits no distension and no mass. There is no tenderness. There is no rebound and no guarding.  Musculoskeletal: Normal range of motion. He exhibits tenderness. He exhibits no edema.  cane  Lymphadenopathy:    He has no cervical adenopathy.  Neurological: He is alert and oriented to person, place, and time. He has normal reflexes. No cranial nerve deficit. He exhibits normal muscle tone. Coordination normal.  Skin: Skin is warm and dry. No rash noted.  Psychiatric: He has a normal mood and affect. His behavior is normal. Judgment and thought content normal.  less bruises  AKs Eczema on B palms  Lab Results  Component Value Date   WBC 8.8 02/20/2012   HGB 15.0 02/20/2012   HCT 45.5 02/20/2012   PLT 271.0 02/20/2012   GLUCOSE 79 08/25/2012   CHOL 146 11/19/2012   TRIG 93.0 11/19/2012   HDL 38.50* 11/19/2012   LDLCALC 89 11/19/2012   ALT 16 11/19/2012   AST 17 11/19/2012   NA 136 08/25/2012   K 4.7 08/25/2012   CL 99 08/25/2012   CREATININE 0.9 08/25/2012   BUN 11 08/25/2012   CO2 31 08/25/2012   TSH 1.14 04/12/2011   PSA 0.01* 02/20/2012   INR 1.0 02/20/2012   HGBA1C 5.7 07/11/2006         Assessment & Plan:

## 2013-03-25 NOTE — Assessment & Plan Note (Signed)
No change 

## 2013-03-26 ENCOUNTER — Encounter: Payer: Self-pay | Admitting: Internal Medicine

## 2013-04-06 DIAGNOSIS — M79609 Pain in unspecified limb: Secondary | ICD-10-CM | POA: Diagnosis not present

## 2013-04-06 DIAGNOSIS — R209 Unspecified disturbances of skin sensation: Secondary | ICD-10-CM | POA: Diagnosis not present

## 2013-05-13 ENCOUNTER — Other Ambulatory Visit: Payer: Self-pay | Admitting: Cardiovascular Disease

## 2013-06-18 ENCOUNTER — Other Ambulatory Visit: Payer: Self-pay | Admitting: Cardiovascular Disease

## 2013-06-23 ENCOUNTER — Ambulatory Visit (INDEPENDENT_AMBULATORY_CARE_PROVIDER_SITE_OTHER): Payer: Medicare Other | Admitting: Internal Medicine

## 2013-06-23 ENCOUNTER — Encounter: Payer: Self-pay | Admitting: Internal Medicine

## 2013-06-23 VITALS — BP 128/72 | HR 80 | Temp 99.0°F | Resp 16 | Wt 169.0 lb

## 2013-06-23 DIAGNOSIS — M545 Low back pain, unspecified: Secondary | ICD-10-CM | POA: Diagnosis not present

## 2013-06-23 DIAGNOSIS — I7389 Other specified peripheral vascular diseases: Secondary | ICD-10-CM | POA: Diagnosis not present

## 2013-06-23 DIAGNOSIS — J449 Chronic obstructive pulmonary disease, unspecified: Secondary | ICD-10-CM | POA: Diagnosis not present

## 2013-06-23 DIAGNOSIS — I2510 Atherosclerotic heart disease of native coronary artery without angina pectoris: Secondary | ICD-10-CM

## 2013-06-23 DIAGNOSIS — J309 Allergic rhinitis, unspecified: Secondary | ICD-10-CM | POA: Diagnosis not present

## 2013-06-23 DIAGNOSIS — I739 Peripheral vascular disease, unspecified: Secondary | ICD-10-CM

## 2013-06-23 DIAGNOSIS — J4489 Other specified chronic obstructive pulmonary disease: Secondary | ICD-10-CM

## 2013-06-23 MED ORDER — CLOBETASOL PROPIONATE 0.05 % EX OINT: 1.0000 "application " | TOPICAL_OINTMENT | Freq: Two times a day (BID) | CUTANEOUS | Status: DC

## 2013-06-23 MED ORDER — METHYLPREDNISOLONE ACETATE 80 MG/ML IJ SUSP
80.0000 mg | Freq: Once | INTRAMUSCULAR | Status: AC
  Administered 2013-06-23: 80 mg via INTRAMUSCULAR

## 2013-06-23 NOTE — Assessment & Plan Note (Signed)
Continue with current prescription therapy as reflected on the Med list.  

## 2013-06-23 NOTE — Patient Instructions (Signed)
Gluten free trial (no wheat products) for 4-6 weeks. OK to use gluten-free bread and gluten-free pasta.     

## 2013-06-23 NOTE — Progress Notes (Signed)
   Subjective:    HPI  The patient presents for a follow-up of  chronic hypertension, chronic dyslipidemia, PVD, COPD controlled with medicines. F/u pain in LS spine, legs 6-7/10 worse lately F/u rash on B palms x wk F/u skin lesions   Wt Readings from Last 3 Encounters:  06/23/13 169 lb (76.658 kg)  03/25/13 172 lb (78.019 kg)  12/24/12 171 lb (77.565 kg)   BP Readings from Last 3 Encounters:  06/23/13 128/72  03/25/13 130/68  12/24/12 130/64     Review of Systems  Constitutional: Negative for appetite change, fatigue and unexpected weight change.  HENT: Positive for congestion, nosebleeds and rhinorrhea. Negative for sneezing, sore throat and trouble swallowing.   Eyes: Negative for itching and visual disturbance.  Respiratory: Positive for cough.   Cardiovascular: Negative for chest pain, palpitations and leg swelling.  Gastrointestinal: Negative for nausea, diarrhea, blood in stool and abdominal distention.  Genitourinary: Negative for frequency and hematuria.  Musculoskeletal: Positive for arthralgias, back pain and gait problem. Negative for joint swelling and neck pain.  Skin: Negative for rash.  Neurological: Negative for dizziness, tremors, speech difficulty and weakness.  Psychiatric/Behavioral: Negative for sleep disturbance, dysphoric mood and agitation. The patient is not nervous/anxious.        Objective:   Physical Exam  Constitutional: He is oriented to person, place, and time. He appears well-developed.  HENT:  Mouth/Throat: Oropharynx is clear and moist.  Swollen nasal mucosa  Eyes: Conjunctivae are normal. Pupils are equal, round, and reactive to light.  Neck: Normal range of motion. No JVD present. No thyromegaly present.  Cardiovascular: Normal rate, regular rhythm, normal heart sounds and intact distal pulses.  Exam reveals no gallop and no friction rub.   No murmur heard. Pulmonary/Chest: Effort normal and breath sounds normal. No respiratory  distress. He has no wheezes. He has no rales. He exhibits no tenderness.  Abdominal: Soft. Bowel sounds are normal. He exhibits no distension and no mass. There is no tenderness. There is no rebound and no guarding.  Musculoskeletal: Normal range of motion. He exhibits tenderness. He exhibits no edema.  cane  Lymphadenopathy:    He has no cervical adenopathy.  Neurological: He is alert and oriented to person, place, and time. He has normal reflexes. No cranial nerve deficit. He exhibits normal muscle tone. Coordination normal.  Skin: Skin is warm and dry. No rash noted.  Psychiatric: He has a normal mood and affect. His behavior is normal. Judgment and thought content normal.  less bruises  AKs Eczema on B palms  Lab Results  Component Value Date   WBC 8.8 02/20/2012   HGB 15.0 02/20/2012   HCT 45.5 02/20/2012   PLT 271.0 02/20/2012   GLUCOSE 79 08/25/2012   CHOL 146 11/19/2012   TRIG 93.0 11/19/2012   HDL 38.50* 11/19/2012   LDLCALC 89 11/19/2012   ALT 16 11/19/2012   AST 17 11/19/2012   NA 136 08/25/2012   K 4.7 08/25/2012   CL 99 08/25/2012   CREATININE 0.9 08/25/2012   BUN 11 08/25/2012   CO2 31 08/25/2012   TSH 1.14 04/12/2011   PSA 0.01* 02/20/2012   INR 1.0 02/20/2012   HGBA1C 5.7 07/11/2006         Assessment & Plan:

## 2013-06-23 NOTE — Progress Notes (Signed)
Pre visit review using our clinic review tool, if applicable. No additional management support is needed unless otherwise documented below in the visit note. 

## 2013-06-26 ENCOUNTER — Telehealth: Payer: Self-pay | Admitting: Internal Medicine

## 2013-06-26 NOTE — Telephone Encounter (Signed)
Relevant patient education mailed to patient.  

## 2013-07-15 DIAGNOSIS — C61 Malignant neoplasm of prostate: Secondary | ICD-10-CM | POA: Diagnosis not present

## 2013-07-22 DIAGNOSIS — C61 Malignant neoplasm of prostate: Secondary | ICD-10-CM | POA: Diagnosis not present

## 2013-07-27 NOTE — Telephone Encounter (Signed)
error 

## 2013-08-05 ENCOUNTER — Ambulatory Visit (INDEPENDENT_AMBULATORY_CARE_PROVIDER_SITE_OTHER): Payer: Medicare Other | Admitting: Physician Assistant

## 2013-08-05 ENCOUNTER — Encounter: Payer: Self-pay | Admitting: Physician Assistant

## 2013-08-05 VITALS — BP 120/60 | HR 54 | Ht 71.5 in | Wt 170.8 lb

## 2013-08-05 DIAGNOSIS — E785 Hyperlipidemia, unspecified: Secondary | ICD-10-CM

## 2013-08-05 DIAGNOSIS — M545 Low back pain, unspecified: Secondary | ICD-10-CM

## 2013-08-05 DIAGNOSIS — F17200 Nicotine dependence, unspecified, uncomplicated: Secondary | ICD-10-CM

## 2013-08-05 DIAGNOSIS — G459 Transient cerebral ischemic attack, unspecified: Secondary | ICD-10-CM | POA: Diagnosis not present

## 2013-08-05 DIAGNOSIS — I2510 Atherosclerotic heart disease of native coronary artery without angina pectoris: Secondary | ICD-10-CM | POA: Diagnosis not present

## 2013-08-05 DIAGNOSIS — I739 Peripheral vascular disease, unspecified: Secondary | ICD-10-CM | POA: Diagnosis not present

## 2013-08-05 NOTE — Assessment & Plan Note (Signed)
Back pain continues to limit all his activities. He walks with a cane.

## 2013-08-05 NOTE — Assessment & Plan Note (Signed)
Stable on current medications without chest pain. 

## 2013-08-05 NOTE — Assessment & Plan Note (Signed)
Smoking cessation discussed with patient. He has no intention of quitting.

## 2013-08-05 NOTE — Assessment & Plan Note (Signed)
Patient continues to have leg cramps about 3 times a week. We will recheck ABIs in June.

## 2013-08-05 NOTE — Progress Notes (Signed)
HPI:   This is a 73 year old male patient of Dr. Angelena Form with history of CAD s/p CABG 1993, PVD, hyperlipidemia who is here today for cardiac and PV follow up. In March 2011 he was evaluated for severe bilateral lower ext pain. CT angiogram which showed disease in the ostium of the right common iliac and serial lesions in the right SFA and a total occlusion of the left SFA. He had a distal aortogram with bilateral lower ext runoff. Dr. Angelena Form was able to open his left SFA chronic total occlusion and placed a stent in May 2011. He has a staged intervention of his right SFA severe stenosis and treated this with a [place] atherectomy device in June 2011. He was seen for left leg pain and right leg numbness in May 2013. ABI June 2013 and 2014 normal. Dopplers demonstrated patency of left SFA stent and patency of right SFA atherectomy site. Carotid artery dopplers June 2013 and 2014 with 40-59% RICA stenosis, 6-27% LICA stenosis. His last cath was in 1999 and his vein graft to the Circumflex was occluded at that time. (no report can be found).  He is here today for follow up. He denies any chest pain, palpitations, dyspnea, dyspnea on exertion, dizziness, or presyncope. He continues to have severe back pain that limits all his activities. He has leg cramps at night about 3 times a week. He can also feel pain in his left anterior thigh usually when he sits down. This only occurs occasionally. He continues to smoke 4-5 cigarettes daily. He states he's never tried to quit.   Allergies -- Codeine Sulfate   Current Outpatient Prescriptions on File Prior to Visit: amLODipine (NORVASC) 5 MG tablet, TAKE 1 TABLET DAILY, Disp: 90 tablet, Rfl: 0 aspirin 81 MG tablet, Take 81 mg by mouth daily.  , Disp: , Rfl:  Cholecalciferol (VITAMIN D3) 1000 UNIT capsule, Take 1,000 Units by mouth daily.  , Disp: , Rfl:  clobetasol ointment (TEMOVATE) 0.35 %, Apply 1 application topically 2 (two) times daily., Disp: 60 g, Rfl:  3 isosorbide mononitrate (IMDUR) 30 MG 24 hr tablet, TAKE 1 TABLET DAILY, Disp: 90 tablet, Rfl: 0 loratadine (CLARITIN) 10 MG tablet, Take 10 mg by mouth daily as needed. For allergies , Disp: , Rfl:  LORazepam (ATIVAN) 0.5 MG tablet, Take 0.5 mg by mouth at bedtime.  , Disp: , Rfl:  metoprolol (LOPRESSOR) 50 MG tablet, Take 25 mg by mouth daily.  , Disp: , Rfl:  Multiple Vitamin (MULTIVITAMIN) tablet, Take 1 tablet by mouth daily.  , Disp: , Rfl:  omeprazole (PRILOSEC) 20 MG capsule, Take 20 mg by mouth daily.  , Disp: , Rfl:  Probiotic Product (PROBIOTIC FORMULA PO), Take 3 tablets by mouth daily., Disp: , Rfl:  simvastatin (ZOCOR) 20 MG tablet, TAKE 1 TABLET AT BEDTIME, Disp: 90 tablet, Rfl: 0 traMADol (ULTRAM) 50 MG tablet, Take 50 mg by mouth as needed.  , Disp: , Rfl:   No current facility-administered medications on file prior to visit.   Past Medical History:   CAD (coronary artery disease)                                History of transient ischemic attack (TIA)                   Hyperlipidemia  Peripheral neuropathy                                        PVD (peripheral vascular disease) with claudic*              Carotid bruit                                                  Comment:bilateral   Preoperative examination                                     Bronchitis, acute                                            Ventral hernia                                               Personal history of prostate cancer                          Diverticulosis                                               ALLERGIC RHINITIS                                            Osteoarthritis                                               Low back pain                                                COPD (chronic obstructive pulmonary disease)                 Tobacco use disorder, continuous                            Past Surgical History:   CORONARY  ARTERY BYPASS GRAFT                     1992         LUMBAR LAMINECTOMY                               [id number]      Comment:X2   PROSTATECTOMY  05/2008         Comment:Dr. Alinda Money and XRT   APPENDECTOMY                                                  TONSILLECTOMY                                                Review of patient's family history indicates:   Hypertension                                            Cancer                         Mother                     Comment: Colon   Heart disease                  Father                   Coronary artery disease        Father                   Cancer                         Sister                   Heart disease                  Brother                  Social History   Marital Status: Married             Spouse Name:                      Years of Education:                 Number of children:             Occupational History Occupation          Insurance account manager*                       Social History Main Topics   Smoking Status: Current Every Day Smoker        Packs/Day: 0.00  Years: 40        Types: Cigarettes   Smokeless Status: Not on file                      Alcohol Use: No             Drug Use: No             Sexual Activity: Yes  Other Topics            Concern   None on file  Social History Narrative   None on file    ROS: See history of present illness otherwise negative   PHYSICAL EXAM: Well-nournished, in no acute distress, smells of cigarette smoke. Neck: No JVD, HJR, Bruit, or thyroid enlargement  Lungs: Decreased breath sounds throughout but No tachypnea, clear without wheezing, rales, or rhonchi  Cardiovascular: RRR, PMI not displaced, heart sounds distant positive S4, no murmurs, bruit, thrill, or heave.  Abdomen: BS normal. Soft without organomegaly, masses, lesions or tenderness.  Extremities: without  cyanosis, clubbing or edema. Good distal pulses bilateral  SKin: Warm, no lesions or rashes   Musculoskeletal: No deformities  Neuro: no focal signs  BP 120/60  Pulse 54  Ht 5' 11.5" (1.816 m)  Wt 170 lb 12.8 oz (77.474 kg)  BMI 23.49 kg/m2   EKG: Sinus bradycardia 54 beats per minute nonspecific ST-T wave changes, no acute change

## 2013-08-05 NOTE — Patient Instructions (Addendum)
Your physician wants you to follow-up in: [place] DR. Angelena Form You will receive a reminder letter in the mail two months in advance. If you don't receive a letter, please call our office to schedule the follow-up appointment.  Your physician has requested that you have an ankle brachial index (ABI) IN June 2015. During this test an ultrasound and blood pressure cuff are used to evaluate the arteries that supply the arms and legs with blood. Allow thirty minutes for this exam. There are no restrictions or special instructions.  Your physician has requested that you have an abdominal ARTERIAL DOPPLER (IF ABI IS ABNORMAL) IN June 2015 . During this test, an ultrasound is used to evaluate the aorta. Allow 30 minutes for this exam. Do not eat after midnight the day before and avoid carbonated beverages  Your physician has requested that you have a carotid DOPPLER IN June 2015. This test is an ultrasound of the carotid arteries in your neck. It looks at blood flow through these arteries that supply the brain with blood. Allow one hour for this exam. There are no restrictions or special instructions.

## 2013-08-05 NOTE — Assessment & Plan Note (Signed)
Patient had his lipids checked 2 weeks ago at the [place]. He was told they were normal. He will bring a copy of his labs next office visit.

## 2013-08-10 ENCOUNTER — Ambulatory Visit: Payer: Medicare Other | Admitting: Cardiovascular Disease

## 2013-08-11 ENCOUNTER — Other Ambulatory Visit: Payer: Self-pay

## 2013-08-11 MED ORDER — AMLODIPINE BESYLATE 5 MG PO TABS: ORAL_TABLET | ORAL | Status: DC

## 2013-08-11 MED ORDER — METOPROLOL TARTRATE 50 MG PO TABS: 25.0000 mg | ORAL_TABLET | Freq: Every day | ORAL | Status: DC

## 2013-08-11 MED ORDER — SIMVASTATIN 20 MG PO TABS: ORAL_TABLET | ORAL | Status: DC

## 2013-08-11 MED ORDER — ISOSORBIDE MONONITRATE ER 30 MG PO TB24: ORAL_TABLET | ORAL | Status: DC

## 2013-09-02 ENCOUNTER — Ambulatory Visit (INDEPENDENT_AMBULATORY_CARE_PROVIDER_SITE_OTHER): Payer: Medicare Other | Admitting: Internal Medicine

## 2013-09-02 ENCOUNTER — Encounter: Payer: Self-pay | Admitting: Internal Medicine

## 2013-09-02 VITALS — BP 120/76 | HR 80 | Temp 98.4°F | Resp 16 | Wt 173.0 lb

## 2013-09-02 DIAGNOSIS — I2510 Atherosclerotic heart disease of native coronary artery without angina pectoris: Secondary | ICD-10-CM

## 2013-09-02 DIAGNOSIS — M545 Low back pain, unspecified: Secondary | ICD-10-CM | POA: Diagnosis not present

## 2013-09-02 DIAGNOSIS — G609 Hereditary and idiopathic neuropathy, unspecified: Secondary | ICD-10-CM | POA: Diagnosis not present

## 2013-09-02 DIAGNOSIS — I739 Peripheral vascular disease, unspecified: Secondary | ICD-10-CM

## 2013-09-02 DIAGNOSIS — L309 Dermatitis, unspecified: Secondary | ICD-10-CM

## 2013-09-02 DIAGNOSIS — F17200 Nicotine dependence, unspecified, uncomplicated: Secondary | ICD-10-CM

## 2013-09-02 DIAGNOSIS — L259 Unspecified contact dermatitis, unspecified cause: Secondary | ICD-10-CM

## 2013-09-02 NOTE — Assessment & Plan Note (Signed)
Continue with current prescription therapy as reflected on the Med list.  

## 2013-09-02 NOTE — Assessment & Plan Note (Signed)
Smoking discussed 

## 2013-09-02 NOTE — Assessment & Plan Note (Signed)
Discussed.

## 2013-09-02 NOTE — Assessment & Plan Note (Signed)
Try gluten free diet 

## 2013-09-02 NOTE — Progress Notes (Signed)
   Subjective:    HPI  The patient presents for a follow-up of  chronic hypertension, chronic dyslipidemia, PVD, COPD controlled with medicines. F/u pain in LS spine, legs 6-7/10 worse lately F/u rash on B palms and soles, worse x wks F/u skin lesions   Wt Readings from Last 3 Encounters:  09/02/13 173 lb (78.472 kg)  08/05/13 170 lb 12.8 oz (77.474 kg)  06/23/13 169 lb (76.658 kg)   BP Readings from Last 3 Encounters:  09/02/13 120/76  08/05/13 120/60  06/23/13 128/72     Review of Systems  Constitutional: Negative for appetite change, fatigue and unexpected weight change.  HENT: Positive for congestion, nosebleeds and rhinorrhea. Negative for sneezing, sore throat and trouble swallowing.   Eyes: Negative for itching and visual disturbance.  Respiratory: Positive for cough.   Cardiovascular: Negative for chest pain, palpitations and leg swelling.  Gastrointestinal: Negative for nausea, diarrhea, blood in stool and abdominal distention.  Genitourinary: Negative for frequency and hematuria.  Musculoskeletal: Positive for arthralgias, back pain and gait problem. Negative for joint swelling and neck pain.  Skin: Negative for rash.  Neurological: Negative for dizziness, tremors, speech difficulty and weakness.  Psychiatric/Behavioral: Negative for sleep disturbance, dysphoric mood and agitation. The patient is not nervous/anxious.        Objective:   Physical Exam  Constitutional: He is oriented to person, place, and time. He appears well-developed.  HENT:  Mouth/Throat: Oropharynx is clear and moist.  Swollen nasal mucosa  Eyes: Conjunctivae are normal. Pupils are equal, round, and reactive to light.  Neck: Normal range of motion. No JVD present. No thyromegaly present.  Cardiovascular: Normal rate, regular rhythm, normal heart sounds and intact distal pulses.  Exam reveals no gallop and no friction rub.   No murmur heard. Pulmonary/Chest: Effort normal and breath sounds  normal. No respiratory distress. He has no wheezes. He has no rales. He exhibits no tenderness.  Abdominal: Soft. Bowel sounds are normal. He exhibits no distension and no mass. There is no tenderness. There is no rebound and no guarding.  Musculoskeletal: Normal range of motion. He exhibits tenderness. He exhibits no edema.  cane  Lymphadenopathy:    He has no cervical adenopathy.  Neurological: He is alert and oriented to person, place, and time. He has normal reflexes. No cranial nerve deficit. He exhibits normal muscle tone. Coordination normal.  Skin: Skin is warm and dry. No rash noted.  Psychiatric: He has a normal mood and affect. His behavior is normal. Judgment and thought content normal.  less bruises  AKs Eczema on B palms and B feet  Lab Results  Component Value Date   WBC 8.8 02/20/2012   HGB 15.0 02/20/2012   HCT 45.5 02/20/2012   PLT 271.0 02/20/2012   GLUCOSE 79 08/25/2012   CHOL 146 11/19/2012   TRIG 93.0 11/19/2012   HDL 38.50* 11/19/2012   LDLCALC 89 11/19/2012   ALT 16 11/19/2012   AST 17 11/19/2012   NA 136 08/25/2012   K 4.7 08/25/2012   CL 99 08/25/2012   CREATININE 0.9 08/25/2012   BUN 11 08/25/2012   CO2 31 08/25/2012   TSH 1.14 04/12/2011   PSA 0.01* 02/20/2012   INR 1.0 02/20/2012   HGBA1C 5.7 07/11/2006         Assessment & Plan:

## 2013-09-02 NOTE — Progress Notes (Signed)
Pre visit review using our clinic review tool, if applicable. No additional management support is needed unless otherwise documented below in the visit note. 

## 2013-09-02 NOTE — Patient Instructions (Signed)
Gluten free trial (no wheat products) for 4-6 weeks. OK to use gluten-free bread, cereal and gluten-free pasta.

## 2013-09-14 ENCOUNTER — Encounter (HOSPITAL_COMMUNITY): Payer: Medicare Other

## 2013-09-14 ENCOUNTER — Other Ambulatory Visit (HOSPITAL_COMMUNITY): Payer: Self-pay | Admitting: Cardiology

## 2013-09-14 DIAGNOSIS — I6529 Occlusion and stenosis of unspecified carotid artery: Secondary | ICD-10-CM

## 2013-09-21 ENCOUNTER — Ambulatory Visit (INDEPENDENT_AMBULATORY_CARE_PROVIDER_SITE_OTHER): Payer: Medicare Other | Admitting: Cardiology

## 2013-09-21 ENCOUNTER — Ambulatory Visit (HOSPITAL_COMMUNITY): Payer: Medicare Other | Attending: Cardiology | Admitting: Cardiology

## 2013-09-21 DIAGNOSIS — Z951 Presence of aortocoronary bypass graft: Secondary | ICD-10-CM | POA: Diagnosis not present

## 2013-09-21 DIAGNOSIS — J449 Chronic obstructive pulmonary disease, unspecified: Secondary | ICD-10-CM | POA: Diagnosis not present

## 2013-09-21 DIAGNOSIS — I658 Occlusion and stenosis of other precerebral arteries: Secondary | ICD-10-CM | POA: Insufficient documentation

## 2013-09-21 DIAGNOSIS — E785 Hyperlipidemia, unspecified: Secondary | ICD-10-CM | POA: Diagnosis not present

## 2013-09-21 DIAGNOSIS — F17200 Nicotine dependence, unspecified, uncomplicated: Secondary | ICD-10-CM | POA: Diagnosis not present

## 2013-09-21 DIAGNOSIS — J4489 Other specified chronic obstructive pulmonary disease: Secondary | ICD-10-CM | POA: Insufficient documentation

## 2013-09-21 DIAGNOSIS — I6529 Occlusion and stenosis of unspecified carotid artery: Secondary | ICD-10-CM | POA: Diagnosis not present

## 2013-09-21 DIAGNOSIS — I739 Peripheral vascular disease, unspecified: Secondary | ICD-10-CM

## 2013-09-21 DIAGNOSIS — Z8673 Personal history of transient ischemic attack (TIA), and cerebral infarction without residual deficits: Secondary | ICD-10-CM | POA: Diagnosis not present

## 2013-09-21 DIAGNOSIS — I2510 Atherosclerotic heart disease of native coronary artery without angina pectoris: Secondary | ICD-10-CM | POA: Diagnosis not present

## 2013-09-21 NOTE — Progress Notes (Signed)
Carotid duplex completed 

## 2013-09-21 NOTE — Progress Notes (Signed)
ABI completed.  

## 2013-09-23 ENCOUNTER — Ambulatory Visit: Payer: Medicare Other | Admitting: Internal Medicine

## 2013-09-30 ENCOUNTER — Ambulatory Visit (HOSPITAL_COMMUNITY): Payer: Medicare Other | Attending: Cardiology | Admitting: *Deleted

## 2013-09-30 DIAGNOSIS — Z951 Presence of aortocoronary bypass graft: Secondary | ICD-10-CM | POA: Insufficient documentation

## 2013-09-30 DIAGNOSIS — J449 Chronic obstructive pulmonary disease, unspecified: Secondary | ICD-10-CM | POA: Insufficient documentation

## 2013-09-30 DIAGNOSIS — E119 Type 2 diabetes mellitus without complications: Secondary | ICD-10-CM | POA: Diagnosis not present

## 2013-09-30 DIAGNOSIS — Z9889 Other specified postprocedural states: Secondary | ICD-10-CM | POA: Insufficient documentation

## 2013-09-30 DIAGNOSIS — I739 Peripheral vascular disease, unspecified: Secondary | ICD-10-CM | POA: Insufficient documentation

## 2013-09-30 DIAGNOSIS — F17200 Nicotine dependence, unspecified, uncomplicated: Secondary | ICD-10-CM | POA: Diagnosis not present

## 2013-09-30 DIAGNOSIS — Z8673 Personal history of transient ischemic attack (TIA), and cerebral infarction without residual deficits: Secondary | ICD-10-CM | POA: Insufficient documentation

## 2013-09-30 DIAGNOSIS — Z09 Encounter for follow-up examination after completed treatment for conditions other than malignant neoplasm: Secondary | ICD-10-CM | POA: Insufficient documentation

## 2013-09-30 DIAGNOSIS — E785 Hyperlipidemia, unspecified: Secondary | ICD-10-CM | POA: Diagnosis not present

## 2013-09-30 DIAGNOSIS — I2510 Atherosclerotic heart disease of native coronary artery without angina pectoris: Secondary | ICD-10-CM | POA: Insufficient documentation

## 2013-09-30 DIAGNOSIS — J4489 Other specified chronic obstructive pulmonary disease: Secondary | ICD-10-CM | POA: Insufficient documentation

## 2013-09-30 NOTE — Progress Notes (Signed)
Bilateral lower extremity arterial duplex complete.

## 2013-12-02 ENCOUNTER — Ambulatory Visit: Payer: Medicare Other | Admitting: Internal Medicine

## 2014-01-06 DIAGNOSIS — H524 Presbyopia: Secondary | ICD-10-CM | POA: Diagnosis not present

## 2014-01-06 DIAGNOSIS — Z961 Presence of intraocular lens: Secondary | ICD-10-CM | POA: Diagnosis not present

## 2014-01-06 DIAGNOSIS — H5210 Myopia, unspecified eye: Secondary | ICD-10-CM | POA: Diagnosis not present

## 2014-01-06 DIAGNOSIS — H2510 Age-related nuclear cataract, unspecified eye: Secondary | ICD-10-CM | POA: Diagnosis not present

## 2014-01-06 DIAGNOSIS — H31009 Unspecified chorioretinal scars, unspecified eye: Secondary | ICD-10-CM | POA: Diagnosis not present

## 2014-01-06 DIAGNOSIS — H5200 Hypermetropia, unspecified eye: Secondary | ICD-10-CM | POA: Diagnosis not present

## 2014-01-06 DIAGNOSIS — H33109 Unspecified retinoschisis, unspecified eye: Secondary | ICD-10-CM | POA: Diagnosis not present

## 2014-01-11 ENCOUNTER — Encounter: Payer: Self-pay | Admitting: Internal Medicine

## 2014-01-11 ENCOUNTER — Ambulatory Visit (INDEPENDENT_AMBULATORY_CARE_PROVIDER_SITE_OTHER): Payer: Medicare Other | Admitting: Internal Medicine

## 2014-01-11 VITALS — BP 110/80 | HR 80 | Temp 98.6°F | Resp 16 | Wt 168.0 lb

## 2014-01-11 DIAGNOSIS — I2510 Atherosclerotic heart disease of native coronary artery without angina pectoris: Secondary | ICD-10-CM | POA: Diagnosis not present

## 2014-01-11 DIAGNOSIS — M5441 Lumbago with sciatica, right side: Secondary | ICD-10-CM | POA: Diagnosis not present

## 2014-01-11 DIAGNOSIS — Z23 Encounter for immunization: Secondary | ICD-10-CM

## 2014-01-11 DIAGNOSIS — M5442 Lumbago with sciatica, left side: Secondary | ICD-10-CM | POA: Diagnosis not present

## 2014-01-11 NOTE — Assessment & Plan Note (Signed)
Continue with current prescription therapy as reflected on the Med list.  

## 2014-01-11 NOTE — Progress Notes (Signed)
Pre visit review using our clinic review tool, if applicable. No additional management support is needed unless otherwise documented below in the visit note. 

## 2014-01-11 NOTE — Progress Notes (Signed)
   Subjective:    HPI  The patient presents for a follow-up of  chronic hypertension, chronic dyslipidemia, PVD, COPD controlled with medicines. F/u pain in LS spine, legs 6-7/10 worse lately F/u rash on B palms and soles, worse x wks F/u skin lesions   Wt Readings from Last 3 Encounters:  01/11/14 168 lb (76.204 kg)  09/02/13 173 lb (78.472 kg)  08/05/13 170 lb 12.8 oz (77.474 kg)   BP Readings from Last 3 Encounters:  01/11/14 110/80  09/02/13 120/76  08/05/13 120/60     Review of Systems  Constitutional: Negative for appetite change, fatigue and unexpected weight change.  HENT: Positive for congestion, nosebleeds and rhinorrhea. Negative for sneezing, sore throat and trouble swallowing.   Eyes: Negative for itching and visual disturbance.  Respiratory: Positive for cough.   Cardiovascular: Negative for chest pain, palpitations and leg swelling.  Gastrointestinal: Negative for nausea, diarrhea, blood in stool and abdominal distention.  Genitourinary: Negative for frequency and hematuria.  Musculoskeletal: Positive for arthralgias, back pain and gait problem. Negative for joint swelling and neck pain.  Skin: Negative for rash.  Neurological: Negative for dizziness, tremors, speech difficulty and weakness.  Psychiatric/Behavioral: Negative for sleep disturbance, dysphoric mood and agitation. The patient is not nervous/anxious.        Objective:   Physical Exam  Constitutional: He is oriented to person, place, and time. He appears well-developed.  HENT:  Mouth/Throat: Oropharynx is clear and moist.  Swollen nasal mucosa  Eyes: Conjunctivae are normal. Pupils are equal, round, and reactive to light.  Neck: Normal range of motion. No JVD present. No thyromegaly present.  Cardiovascular: Normal rate, regular rhythm, normal heart sounds and intact distal pulses.  Exam reveals no gallop and no friction rub.   No murmur heard. Pulmonary/Chest: Effort normal and breath sounds  normal. No respiratory distress. He has no wheezes. He has no rales. He exhibits no tenderness.  Abdominal: Soft. Bowel sounds are normal. He exhibits no distension and no mass. There is no tenderness. There is no rebound and no guarding.  Musculoskeletal: Normal range of motion. He exhibits tenderness. He exhibits no edema.  cane  Lymphadenopathy:    He has no cervical adenopathy.  Neurological: He is alert and oriented to person, place, and time. He has normal reflexes. No cranial nerve deficit. He exhibits normal muscle tone. Coordination normal.  Skin: Skin is warm and dry. No rash noted.  Psychiatric: He has a normal mood and affect. His behavior is normal. Judgment and thought content normal.  less bruises  AKs Eczema on B palms and B feet R upper back pigm growth  Lab Results  Component Value Date   WBC 8.8 02/20/2012   HGB 15.0 02/20/2012   HCT 45.5 02/20/2012   PLT 271.0 02/20/2012   GLUCOSE 79 08/25/2012   CHOL 146 11/19/2012   TRIG 93.0 11/19/2012   HDL 38.50* 11/19/2012   LDLCALC 89 11/19/2012   ALT 16 11/19/2012   AST 17 11/19/2012   NA 136 08/25/2012   K 4.7 08/25/2012   CL 99 08/25/2012   CREATININE 0.9 08/25/2012   BUN 11 08/25/2012   CO2 31 08/25/2012   TSH 1.14 04/12/2011   PSA 0.01* 02/20/2012   INR 1.0 02/20/2012   HGBA1C 5.7 07/11/2006         Assessment & Plan:

## 2014-01-12 ENCOUNTER — Telehealth: Payer: Self-pay | Admitting: Internal Medicine

## 2014-01-12 NOTE — Telephone Encounter (Signed)
emmi mailed  °

## 2014-03-02 DIAGNOSIS — H33102 Unspecified retinoschisis, left eye: Secondary | ICD-10-CM | POA: Diagnosis not present

## 2014-04-14 ENCOUNTER — Encounter: Payer: Self-pay | Admitting: Internal Medicine

## 2014-04-14 ENCOUNTER — Ambulatory Visit (INDEPENDENT_AMBULATORY_CARE_PROVIDER_SITE_OTHER): Payer: Medicare Other | Admitting: Internal Medicine

## 2014-04-14 VITALS — BP 130/70 | HR 59 | Temp 98.2°F | Resp 12 | Ht 71.5 in | Wt 172.0 lb

## 2014-04-14 DIAGNOSIS — B079 Viral wart, unspecified: Secondary | ICD-10-CM

## 2014-04-14 DIAGNOSIS — G609 Hereditary and idiopathic neuropathy, unspecified: Secondary | ICD-10-CM

## 2014-04-14 DIAGNOSIS — Z23 Encounter for immunization: Secondary | ICD-10-CM

## 2014-04-14 DIAGNOSIS — M5442 Lumbago with sciatica, left side: Secondary | ICD-10-CM

## 2014-04-14 DIAGNOSIS — M5441 Lumbago with sciatica, right side: Secondary | ICD-10-CM

## 2014-04-14 DIAGNOSIS — R21 Rash and other nonspecific skin eruption: Secondary | ICD-10-CM

## 2014-04-14 MED ORDER — TRIAMCINOLONE ACETONIDE 0.5 % EX OINT: 1.0000 "application " | TOPICAL_OINTMENT | Freq: Three times a day (TID) | CUTANEOUS | Status: DC

## 2014-04-14 NOTE — Progress Notes (Signed)
Pre visit review using our clinic review tool, if applicable. No additional management support is needed unless otherwise documented below in the visit note. 

## 2014-04-14 NOTE — Patient Instructions (Signed)
   Postprocedure instructions :     Keep the wounds clean. You can wash them with liquid soap and water. Pat dry with gauze or a Kleenex tissue  Before applying antibiotic ointment and a Band-Aid.   You need to report immediately  if  any signs of infection develop.    

## 2014-04-14 NOTE — Progress Notes (Signed)
Subjective:    HPI  The patient presents for a follow-up of  chronic hypertension, chronic dyslipidemia, PVD, COPD controlled with medicines. F/u pain in LS spine, legs 6-7/10 worse lately F/u rash on B palms and soles, worse x wks F/u skin lesions   Wt Readings from Last 3 Encounters:  04/14/14 172 lb (78.019 kg)  01/11/14 168 lb (76.204 kg)  09/02/13 173 lb (78.472 kg)   BP Readings from Last 3 Encounters:  04/14/14 130/70  01/11/14 110/80  09/02/13 120/76     Review of Systems  Constitutional: Negative for appetite change, fatigue and unexpected weight change.  HENT: Positive for congestion, nosebleeds and rhinorrhea. Negative for sneezing, sore throat and trouble swallowing.   Eyes: Negative for itching and visual disturbance.  Respiratory: Positive for cough.   Cardiovascular: Negative for chest pain, palpitations and leg swelling.  Gastrointestinal: Negative for nausea, diarrhea, blood in stool and abdominal distention.  Genitourinary: Negative for frequency and hematuria.  Musculoskeletal: Positive for back pain, arthralgias and gait problem. Negative for joint swelling and neck pain.  Skin: Negative for rash.  Neurological: Negative for dizziness, tremors, speech difficulty and weakness.  Psychiatric/Behavioral: Negative for sleep disturbance, dysphoric mood and agitation. The patient is not nervous/anxious.        Objective:   Physical Exam  Constitutional: He is oriented to person, place, and time. He appears well-developed. No distress.  NAD  HENT:  Mouth/Throat: Oropharynx is clear and moist.  Eyes: Conjunctivae are normal. Pupils are equal, round, and reactive to light.  Neck: Normal range of motion. No JVD present. No thyromegaly present.  Cardiovascular: Normal rate, regular rhythm, normal heart sounds and intact distal pulses.  Exam reveals no gallop and no friction rub.   No murmur heard. Pulmonary/Chest: Effort normal and breath sounds normal. No  respiratory distress. He has no wheezes. He has no rales. He exhibits no tenderness.  Abdominal: Soft. Bowel sounds are normal. He exhibits no distension and no mass. There is no tenderness. There is no rebound and no guarding.  Musculoskeletal: Normal range of motion. He exhibits no edema or tenderness.  Lymphadenopathy:    He has no cervical adenopathy.  Neurological: He is alert and oriented to person, place, and time. He has normal reflexes. No cranial nerve deficit. He exhibits normal muscle tone. He displays a negative Romberg sign. Coordination and gait normal.  No meningeal signs  Skin: Skin is warm and dry. No rash noted.  Psychiatric: He has a normal mood and affect. His behavior is normal. Judgment and thought content normal.  less bruises  AKs Eczema on B palms and B feet R upper back pigm growth - wart  Lab Results  Component Value Date   WBC 8.8 02/20/2012   HGB 15.0 02/20/2012   HCT 45.5 02/20/2012   PLT 271.0 02/20/2012   GLUCOSE 79 08/25/2012   CHOL 146 11/19/2012   TRIG 93.0 11/19/2012   HDL 38.50* 11/19/2012   LDLCALC 89 11/19/2012   ALT 16 11/19/2012   AST 17 11/19/2012   NA 136 08/25/2012   K 4.7 08/25/2012   CL 99 08/25/2012   CREATININE 0.9 08/25/2012   BUN 11 08/25/2012   CO2 31 08/25/2012   TSH 1.14 04/12/2011   PSA 0.01* 02/20/2012   INR 1.0 02/20/2012   HGBA1C 5.7 07/11/2006     Procedure Note :     Procedure : Cryosurgery   Indication:  wart   Risks including unsuccessful procedure , bleeding, infection,  bruising, scar, a need for a repeat  procedure and others were explained to the patient in detail as well as the benefits. Informed consent was obtained verbally.    1 lesion(s)  on the upper back  was/were treated with liquid nitrogen on a Q-tip in a usual fasion . Band-Aid was applied and antibiotic ointment was given for a later use.   Tolerated well. Complications none.       Assessment & Plan:

## 2014-04-14 NOTE — Assessment & Plan Note (Signed)
Cryo  

## 2014-04-18 DIAGNOSIS — B079 Viral wart, unspecified: Secondary | ICD-10-CM | POA: Insufficient documentation

## 2014-04-18 NOTE — Assessment & Plan Note (Signed)
Continue with current prn Tramadol therapy as reflected on the Med list.

## 2014-04-18 NOTE — Assessment & Plan Note (Signed)
See procedure 

## 2014-04-18 NOTE — Assessment & Plan Note (Signed)
Tramadol prn 

## 2014-04-18 NOTE — Assessment & Plan Note (Signed)
Cont w/current Rx 

## 2014-04-29 ENCOUNTER — Inpatient Hospital Stay (HOSPITAL_COMMUNITY): Payer: Medicare Other | Admitting: Certified Registered"

## 2014-04-29 ENCOUNTER — Encounter (HOSPITAL_COMMUNITY)
Admission: EM | Disposition: A | Discharge status: Skilled Nursing Facility | Payer: Medicare Other | Source: Home / Self Care | Attending: Cardiology

## 2014-04-29 ENCOUNTER — Encounter (HOSPITAL_COMMUNITY): Payer: Self-pay | Admitting: Emergency Medicine

## 2014-04-29 ENCOUNTER — Inpatient Hospital Stay (HOSPITAL_COMMUNITY)
Admission: EM | Admit: 2014-04-29 | Discharge: 2014-05-14 | DRG: 270 | Disposition: A | Discharge status: Skilled Nursing Facility | Payer: Medicare Other | Attending: Cardiology | Admitting: Cardiology

## 2014-04-29 DIAGNOSIS — T82857A Stenosis of cardiac prosthetic devices, implants and grafts, initial encounter: Secondary | ICD-10-CM | POA: Diagnosis present

## 2014-04-29 DIAGNOSIS — I10 Essential (primary) hypertension: Secondary | ICD-10-CM | POA: Diagnosis present

## 2014-04-29 DIAGNOSIS — J9811 Atelectasis: Secondary | ICD-10-CM | POA: Diagnosis not present

## 2014-04-29 DIAGNOSIS — G629 Polyneuropathy, unspecified: Secondary | ICD-10-CM | POA: Diagnosis present

## 2014-04-29 DIAGNOSIS — I442 Atrioventricular block, complete: Secondary | ICD-10-CM | POA: Diagnosis not present

## 2014-04-29 DIAGNOSIS — D649 Anemia, unspecified: Secondary | ICD-10-CM | POA: Diagnosis present

## 2014-04-29 DIAGNOSIS — J9600 Acute respiratory failure, unspecified whether with hypoxia or hypercapnia: Secondary | ICD-10-CM | POA: Diagnosis not present

## 2014-04-29 DIAGNOSIS — R1312 Dysphagia, oropharyngeal phase: Secondary | ICD-10-CM | POA: Diagnosis not present

## 2014-04-29 DIAGNOSIS — E872 Acidosis: Secondary | ICD-10-CM | POA: Diagnosis not present

## 2014-04-29 DIAGNOSIS — I480 Paroxysmal atrial fibrillation: Secondary | ICD-10-CM | POA: Diagnosis not present

## 2014-04-29 DIAGNOSIS — I483 Typical atrial flutter: Secondary | ICD-10-CM | POA: Diagnosis not present

## 2014-04-29 DIAGNOSIS — Z7982 Long term (current) use of aspirin: Secondary | ICD-10-CM | POA: Diagnosis not present

## 2014-04-29 DIAGNOSIS — F17210 Nicotine dependence, cigarettes, uncomplicated: Secondary | ICD-10-CM | POA: Diagnosis present

## 2014-04-29 DIAGNOSIS — I213 ST elevation (STEMI) myocardial infarction of unspecified site: Secondary | ICD-10-CM

## 2014-04-29 DIAGNOSIS — Z885 Allergy status to narcotic agent status: Secondary | ICD-10-CM

## 2014-04-29 DIAGNOSIS — I4901 Ventricular fibrillation: Secondary | ICD-10-CM | POA: Diagnosis not present

## 2014-04-29 DIAGNOSIS — I426 Alcoholic cardiomyopathy: Secondary | ICD-10-CM | POA: Diagnosis not present

## 2014-04-29 DIAGNOSIS — I472 Ventricular tachycardia, unspecified: Secondary | ICD-10-CM

## 2014-04-29 DIAGNOSIS — I2119 ST elevation (STEMI) myocardial infarction involving other coronary artery of inferior wall: Secondary | ICD-10-CM | POA: Diagnosis present

## 2014-04-29 DIAGNOSIS — I469 Cardiac arrest, cause unspecified: Secondary | ICD-10-CM | POA: Diagnosis not present

## 2014-04-29 DIAGNOSIS — J449 Chronic obstructive pulmonary disease, unspecified: Secondary | ICD-10-CM | POA: Diagnosis present

## 2014-04-29 DIAGNOSIS — Z9911 Dependence on respirator [ventilator] status: Secondary | ICD-10-CM | POA: Diagnosis not present

## 2014-04-29 DIAGNOSIS — R570 Cardiogenic shock: Secondary | ICD-10-CM | POA: Diagnosis not present

## 2014-04-29 DIAGNOSIS — M6281 Muscle weakness (generalized): Secondary | ICD-10-CM | POA: Diagnosis not present

## 2014-04-29 DIAGNOSIS — Y832 Surgical operation with anastomosis, bypass or graft as the cause of abnormal reaction of the patient, or of later complication, without mention of misadventure at the time of the procedure: Secondary | ICD-10-CM | POA: Diagnosis present

## 2014-04-29 DIAGNOSIS — E871 Hypo-osmolality and hyponatremia: Secondary | ICD-10-CM | POA: Diagnosis not present

## 2014-04-29 DIAGNOSIS — Z9861 Coronary angioplasty status: Secondary | ICD-10-CM

## 2014-04-29 DIAGNOSIS — E876 Hypokalemia: Secondary | ICD-10-CM | POA: Diagnosis present

## 2014-04-29 DIAGNOSIS — R488 Other symbolic dysfunctions: Secondary | ICD-10-CM | POA: Diagnosis not present

## 2014-04-29 DIAGNOSIS — Z951 Presence of aortocoronary bypass graft: Secondary | ICD-10-CM

## 2014-04-29 DIAGNOSIS — E785 Hyperlipidemia, unspecified: Secondary | ICD-10-CM | POA: Diagnosis present

## 2014-04-29 DIAGNOSIS — I240 Acute coronary thrombosis not resulting in myocardial infarction: Secondary | ICD-10-CM | POA: Diagnosis not present

## 2014-04-29 DIAGNOSIS — I2510 Atherosclerotic heart disease of native coronary artery without angina pectoris: Secondary | ICD-10-CM | POA: Diagnosis not present

## 2014-04-29 DIAGNOSIS — Z79899 Other long term (current) drug therapy: Secondary | ICD-10-CM | POA: Diagnosis not present

## 2014-04-29 DIAGNOSIS — J9602 Acute respiratory failure with hypercapnia: Secondary | ICD-10-CM

## 2014-04-29 DIAGNOSIS — I5023 Acute on chronic systolic (congestive) heart failure: Secondary | ICD-10-CM | POA: Diagnosis present

## 2014-04-29 DIAGNOSIS — R079 Chest pain, unspecified: Secondary | ICD-10-CM | POA: Diagnosis not present

## 2014-04-29 DIAGNOSIS — J9690 Respiratory failure, unspecified, unspecified whether with hypoxia or hypercapnia: Secondary | ICD-10-CM | POA: Diagnosis not present

## 2014-04-29 DIAGNOSIS — Z4682 Encounter for fitting and adjustment of non-vascular catheter: Secondary | ICD-10-CM | POA: Diagnosis not present

## 2014-04-29 DIAGNOSIS — I2111 ST elevation (STEMI) myocardial infarction involving right coronary artery: Secondary | ICD-10-CM | POA: Diagnosis not present

## 2014-04-29 DIAGNOSIS — I2101 ST elevation (STEMI) myocardial infarction involving left main coronary artery: Secondary | ICD-10-CM | POA: Diagnosis not present

## 2014-04-29 DIAGNOSIS — G9340 Encephalopathy, unspecified: Secondary | ICD-10-CM | POA: Diagnosis not present

## 2014-04-29 DIAGNOSIS — J9601 Acute respiratory failure with hypoxia: Secondary | ICD-10-CM | POA: Diagnosis not present

## 2014-04-29 DIAGNOSIS — Z4659 Encounter for fitting and adjustment of other gastrointestinal appliance and device: Secondary | ICD-10-CM

## 2014-04-29 DIAGNOSIS — Z8673 Personal history of transient ischemic attack (TIA), and cerebral infarction without residual deficits: Secondary | ICD-10-CM

## 2014-04-29 DIAGNOSIS — Z452 Encounter for adjustment and management of vascular access device: Secondary | ICD-10-CM

## 2014-04-29 DIAGNOSIS — R2681 Unsteadiness on feet: Secondary | ICD-10-CM | POA: Diagnosis not present

## 2014-04-29 DIAGNOSIS — J90 Pleural effusion, not elsewhere classified: Secondary | ICD-10-CM | POA: Diagnosis not present

## 2014-04-29 DIAGNOSIS — I25119 Atherosclerotic heart disease of native coronary artery with unspecified angina pectoris: Secondary | ICD-10-CM | POA: Diagnosis not present

## 2014-04-29 DIAGNOSIS — I25110 Atherosclerotic heart disease of native coronary artery with unstable angina pectoris: Secondary | ICD-10-CM | POA: Diagnosis not present

## 2014-04-29 DIAGNOSIS — Z01818 Encounter for other preprocedural examination: Secondary | ICD-10-CM

## 2014-04-29 HISTORY — DX: ST elevation (STEMI) myocardial infarction of unspecified site: I21.3

## 2014-04-29 LAB — DIFFERENTIAL
Basophils Absolute: 0 10*3/uL (ref 0.0–0.1)
Basophils Relative: 1 % (ref 0–1)
Eosinophils Absolute: 0.6 10*3/uL (ref 0.0–0.7)
Eosinophils Relative: 7 % — ABNORMAL HIGH (ref 0–5)
LYMPHS ABS: 1.7 10*3/uL (ref 0.7–4.0)
Lymphocytes Relative: 21 % (ref 12–46)
Monocytes Absolute: 0.9 10*3/uL (ref 0.1–1.0)
Monocytes Relative: 11 % (ref 3–12)
Neutro Abs: 5 10*3/uL (ref 1.7–7.7)
Neutrophils Relative %: 60 % (ref 43–77)

## 2014-04-29 LAB — I-STAT CHEM 8, ED
BUN: 19 mg/dL (ref 6–23)
CHLORIDE: 101 meq/L (ref 96–112)
Calcium, Ion: 1.1 mmol/L — ABNORMAL LOW (ref 1.13–1.30)
Creatinine, Ser: 1.1 mg/dL (ref 0.50–1.35)
Glucose, Bld: 137 mg/dL — ABNORMAL HIGH (ref 70–99)
HCT: 42 % (ref 39.0–52.0)
Hemoglobin: 14.3 g/dL (ref 13.0–17.0)
Potassium: 3.9 mmol/L (ref 3.5–5.1)
Sodium: 140 mmol/L (ref 135–145)
TCO2: 23 mmol/L (ref 0–100)

## 2014-04-29 LAB — PROTIME-INR
INR: 1.01 (ref 0.00–1.49)
PROTHROMBIN TIME: 13.4 s (ref 11.6–15.2)

## 2014-04-29 LAB — BASIC METABOLIC PANEL
ANION GAP: 8 (ref 5–15)
BUN: 15 mg/dL (ref 6–23)
CHLORIDE: 104 meq/L (ref 96–112)
CO2: 26 mmol/L (ref 19–32)
Calcium: 8.7 mg/dL (ref 8.4–10.5)
Creatinine, Ser: 1.15 mg/dL (ref 0.50–1.35)
GFR calc Af Amer: 70 mL/min — ABNORMAL LOW (ref >90)
GFR calc non Af Amer: 60 mL/min — ABNORMAL LOW (ref >90)
Glucose, Bld: 142 mg/dL — ABNORMAL HIGH (ref 70–99)
POTASSIUM: 3.9 mmol/L (ref 3.5–5.1)
Sodium: 138 mmol/L (ref 135–145)

## 2014-04-29 LAB — CBC
HEMATOCRIT: 39.1 % (ref 39.0–52.0)
Hemoglobin: 13.9 g/dL (ref 13.0–17.0)
MCH: 33 pg (ref 26.0–34.0)
MCHC: 35.5 g/dL (ref 30.0–36.0)
MCV: 92.9 fL (ref 78.0–100.0)
PLATELETS: 213 10*3/uL (ref 150–400)
RBC: 4.21 MIL/uL — ABNORMAL LOW (ref 4.22–5.81)
RDW: 12.5 % (ref 11.5–15.5)
WBC: 8.2 10*3/uL (ref 4.0–10.5)

## 2014-04-29 LAB — I-STAT TROPONIN, ED: Troponin i, poc: 0.09 ng/mL (ref 0.00–0.08)

## 2014-04-29 LAB — APTT: APTT: 31 s (ref 24–37)

## 2014-04-29 SURGERY — LEFT HEART CATHETERIZATION WITH CORONARY ANGIOGRAM
Anesthesia: LOCAL

## 2014-04-29 MED ORDER — MIDAZOLAM HCL 2 MG/2ML IJ SOLN
INTRAMUSCULAR | Status: AC
  Filled 2014-04-29: qty 2

## 2014-04-29 MED ORDER — DOPAMINE-DEXTROSE 3.2-5 MG/ML-% IV SOLN
INTRAVENOUS | Status: AC
  Filled 2014-04-29: qty 250

## 2014-04-29 MED ORDER — ONDANSETRON HCL 4 MG/2ML IJ SOLN
INTRAMUSCULAR | Status: AC
  Filled 2014-04-29: qty 2

## 2014-04-29 MED ORDER — BIVALIRUDIN 250 MG IV SOLR
INTRAVENOUS | Status: AC
  Filled 2014-04-29: qty 250

## 2014-04-29 MED ORDER — HEPARIN (PORCINE) IN NACL 2-0.9 UNIT/ML-% IJ SOLN
INTRAMUSCULAR | Status: AC
  Filled 2014-04-29: qty 1000

## 2014-04-29 MED ORDER — CLOPIDOGREL BISULFATE 300 MG PO TABS
300.0000 mg | ORAL_TABLET | Freq: Once | ORAL | Status: AC
  Administered 2014-04-29: 300 mg via ORAL
  Filled 2014-04-29: qty 1

## 2014-04-29 MED ORDER — AMIODARONE HCL 150 MG/3ML IV SOLN
INTRAVENOUS | Status: AC
  Filled 2014-04-29: qty 3

## 2014-04-29 MED ORDER — EPINEPHRINE HCL 0.1 MG/ML IJ SOSY
PREFILLED_SYRINGE | INTRAMUSCULAR | Status: AC
  Filled 2014-04-29: qty 10

## 2014-04-29 MED ORDER — DOPAMINE-DEXTROSE 3.2-5 MG/ML-% IV SOLN
0.0000 ug/kg/min | INTRAVENOUS | Status: DC
  Administered 2014-04-29: 10 ug/kg/min via INTRAVENOUS

## 2014-04-29 MED ORDER — MORPHINE SULFATE 2 MG/ML IJ SOLN
2.0000 mg | Freq: Once | INTRAMUSCULAR | Status: AC
  Administered 2014-04-29: 2 mg via INTRAVENOUS

## 2014-04-29 MED ORDER — ETOMIDATE 2 MG/ML IV SOLN
INTRAVENOUS | Status: DC | PRN
  Administered 2014-04-29: 14 mg via INTRAVENOUS

## 2014-04-29 MED ORDER — SODIUM CHLORIDE 0.9 % IV SOLN
Freq: Once | INTRAVENOUS | Status: AC
  Administered 2014-04-29: 22:00:00 via INTRAVENOUS

## 2014-04-29 MED ORDER — CLOPIDOGREL BISULFATE 300 MG PO TABS
300.0000 mg | ORAL_TABLET | Freq: Every day | ORAL | Status: DC
Start: 2014-04-30 — End: 2014-04-30
  Administered 2014-04-29: 300 mg via ORAL

## 2014-04-29 MED ORDER — ATROPINE SULFATE 0.1 MG/ML IJ SOLN
INTRAMUSCULAR | Status: AC
  Filled 2014-04-29: qty 10

## 2014-04-29 MED ORDER — LIDOCAINE HCL (PF) 1 % IJ SOLN
INTRAMUSCULAR | Status: AC
  Filled 2014-04-29: qty 30

## 2014-04-29 MED ORDER — HEPARIN (PORCINE) IN NACL 100-0.45 UNIT/ML-% IJ SOLN
1000.0000 [IU]/h | INTRAMUSCULAR | Status: DC
  Administered 2014-04-29: 1000 [IU]/h via INTRAVENOUS
  Filled 2014-04-29: qty 250

## 2014-04-29 MED ORDER — NITROGLYCERIN 1 MG/10 ML FOR IR/CATH LAB
INTRA_ARTERIAL | Status: AC
  Filled 2014-04-29: qty 10

## 2014-04-29 MED ORDER — ONDANSETRON HCL 4 MG/2ML IJ SOLN
4.0000 mg | Freq: Once | INTRAMUSCULAR | Status: AC
  Administered 2014-04-29: 4 mg via INTRAVENOUS

## 2014-04-29 MED ORDER — SUCCINYLCHOLINE CHLORIDE 20 MG/ML IJ SOLN
INTRAMUSCULAR | Status: DC | PRN
  Administered 2014-04-29: 120 mg via INTRAVENOUS

## 2014-04-29 MED ORDER — HEPARIN SODIUM (PORCINE) 5000 UNIT/ML IJ SOLN
4000.0000 [IU] | Freq: Once | INTRAMUSCULAR | Status: AC
  Administered 2014-04-29: 4000 [IU] via INTRAVENOUS

## 2014-04-29 MED ORDER — FENTANYL CITRATE 0.05 MG/ML IJ SOLN
INTRAMUSCULAR | Status: AC
  Filled 2014-04-29: qty 2

## 2014-04-29 NOTE — ED Notes (Signed)
Patient presents with left sided CP Hr in the 30's. Patient is vague on descriiption of pain. N/v, SOB and diaphoresis EMS gave 1SL nitro, 4mg  Morphine, 324 ASA, 4mg  Zofran and 250 CC of NS

## 2014-04-29 NOTE — Progress Notes (Signed)
Chaplain responded to code stemi. Chaplain sat with pt wife, "[name]" and facilitated storytelling. Wife expressed that her husband has been through a lot, but "God won't give me more than I can handle." Charge nurse arranged for another nurse to sit with pt wife until pt son arrived. Pt son, [name]. Now in hospital. Chaplain provided emotional support and offered hospitality. Page chaplain as needed.    04/29/14 2200  Clinical Encounter Type  Visited With Patient and family together  Visit Type Initial;Spiritual support;Code  Spiritual Encounters  Spiritual Needs Emotional  Stress Factors  Family Stress Factors Health changes  [name], Epifanio Lesches 04/29/2014 10:59 PM

## 2014-04-29 NOTE — H&P (Addendum)
Physician History and Physical    [name] MRN: [id number] DOB/AGE: 11-10-1940 74 y.o. Admit date: 04/29/2014  Primary Cardiologist:  Dr. Angelena Form  CC:  Inf-pos STEMI  HPI:  [name] with history of CAD s/p CABG 1993(unclear anatomy), PVD s/p PCI to left SFA and right SFA, hyperlipidemia, who presented to ED via EMS for inf-posterior STEMI and complete HB. Per previous note, his last cath was 1999, anatomy unclear but SVG-LCx is completely occluded at that time. He apparently developed chest pain about 3 hours ago, left sided, pressure like and associated with N/V. Upon arrival to ED CP still 6/10, he is s/p ASA 325 mg, 4 mg IV Morphine, 1 x Nitro by EMS. His BP 80/40s with HR 30s in CHB, Dopamine started and uptitrated to 15, with improvement of BP to systolic 614E and HR to high 40s. Pt also required morphine 2 mg x1, Zofran 4 mg x2 in ED due to persistent N/V. Plavix loading attempted x 2 (300 mg each) but pt threw up each time. Therefore, NO PLAVIX was taken in by patient. Hepatin gtt started and patient brought to cath lab emergently. He was found to have critical LM disease and LIMA graft was found to be atretic. SVGs found to be occluded but probably chronic. Patient had frequent VT/VF, requiring multiple defib x13, Amio bolus -> gtt and Lido bolus -> gtt. He was subsequently intubated for airway protection. Temp wire pacer and IABP were inserted.   PCI is being performed at this point, please see detailed cath report for details.    Review of systems: A review of 10 organ systems was done and is negative except as stated above in HPI  Past Medical History  Diagnosis Date  . CAD (coronary artery disease)   . History of transient ischemic attack (TIA)   . Hyperlipidemia   . Peripheral neuropathy   . PVD (peripheral vascular disease) with claudication   . Carotid bruit     bilateral  . Preoperative examination   . Bronchitis, acute   . Ventral hernia   . Personal  history of prostate cancer   . Diverticulosis   . ALLERGIC RHINITIS   . Osteoarthritis   . Low back pain   . COPD (chronic obstructive pulmonary disease)   . Tobacco use disorder, continuous    Past Surgical History  Procedure Laterality Date  . Coronary artery bypass graft  1992  . Lumbar laminectomy  [id number]     X2  . Prostatectomy  05/2008    Dr. Alinda Money and XRT  . Appendectomy    . Tonsillectomy     History   Social History  . Marital Status: Married    Spouse Name: N/A    Number of Children: N/A  . Years of Education: N/A   Occupational History  . Retired-self employed    Social History Main Topics  . Smoking status: Current Every Day Smoker -- 40 years    Types: Cigarettes  . Smokeless tobacco: Not on file  . Alcohol Use: No  . Drug Use: No  . Sexual Activity: Yes   Other Topics Concern  . Not on file   Social History Narrative    Family History  Problem Relation Age of Onset  . Hypertension    . Cancer Mother     Colon  . Heart disease Father   . Coronary artery disease Father   . Cancer Sister   . Heart disease Brother  Allergies  Allergen Reactions  . Codeine Sulfate      Current Facility-Administered Medications  Medication Dose Route Frequency Provider Last Rate Last Dose  . acetaminophen (TYLENOL) tablet 650 mg  650 mg Oral Q4H PRN Manus Gunning, MD      . Derrill Memo ON 05/01/2014] aspirin EC tablet 81 mg  81 mg Oral Daily Manus Gunning, MD      . atorvastatin (LIPITOR) tablet 80 mg  80 mg Oral q1800 Manus Gunning, MD      . Doug Sou Hold] clopidogrel (PLAVIX) tablet 300 mg  300 mg Oral Daily Manus Gunning, MD   300 mg at 04/29/14 2212  . [MAR Hold] DOPamine (INTROPIN) 800 mg in dextrose 5 % 250 mL (3.2 mg/mL) infusion  0-20 mcg/kg/min Intravenous Titrated Kristen N Ward, DO 21.9 mL/hr at 04/29/14 2148 15 mcg/kg/min at 04/29/14 2148  . heparin ADULT infusion 100 units/mL (25000 units/250 mL)  1,000 Units/hr Intravenous Continuous Manus Gunning, MD 10 mL/hr at 04/29/14 2152  1,000 Units/hr at 04/29/14 2152  . ondansetron (ZOFRAN) injection 4 mg  4 mg Intravenous Q6H PRN Manus Gunning, MD        Physical Exam: Blood pressure 151/37, pulse 40, temperature 98.2 F (36.8 C), resp. rate 21, height 5\' 11"  (1.803 m), weight 77.111 kg (170 lb), SpO2 96 %.; Body mass index is 23.72 kg/(m^2). Temp:  [98.2 F (36.8 C)] 98.2 F (36.8 C) (01/21 2133) Pulse Rate:  [40-48] 40 (01/21 2215) Resp:  [18-21] 21 (01/21 2207) BP: (89-151)/(37-48) 151/37 mmHg (01/21 2215) SpO2:  [96 %-100 %] 96 % (01/21 2207) Weight:  [77.111 kg (170 lb)-78 kg (171 lb 15.3 oz)] 77.111 kg (170 lb) (01/21 2136)  No intake or output data in the 24 hours ending 04/29/14 2229 General: in distress, pale.  Heent: MMM Neck: No JVD  CV: Nondisplaced PMI.  RRR, nl S1/S2, no S3/S4, no murmur. No carotid bruit   Lungs: Clear to auscultation bilaterally with normal respiratory effort Abdomen: Soft, nontender, nondistended Extremities: No clubbing or cyanosis.  Normal pedal pulses. No pedal edema Skin: Intact without lesions or rashes  Neurologic: Alert and oriented x 3, grossly nonfocal  Psych: Normal mood and affect    Labs: No results for input(s): CKTOTAL, CKMB, TROPONINI in the last 72 hours. Lab Results  Component Value Date   WBC 8.2 04/29/2014   HGB 14.3 04/29/2014   HCT 42.0 04/29/2014   MCV 92.9 04/29/2014   PLT 213 04/29/2014    Recent Labs Lab 04/29/14 2130 04/29/14 2134  NA 138 140  K 3.9 3.9  CL 104 101  CO2 26  --   BUN 15 19  CREATININE 1.15 1.10  CALCIUM 8.7  --   GLUCOSE 142* 137*   Lab Results  Component Value Date   CHOL 146 11/19/2012   HDL 38.50* 11/19/2012   LDLCALC 89 11/19/2012   TRIG 93.0 11/19/2012     EKG:  Complete HB with inf-posterior ST elevation.  Echo:  BS echo showed EF 35-40%, RV mildly dysfunctiona.   Radiology:  No results found.  ASSESSMENT: 1.Inf-pos STEMI with coronary angiography showing critical LM disease with atretic LIMA-LAD and  occluded vein grafts. 2. Complete heart block 2/2 #1.   3. Cardiogenic shock 2/2 #1 and #2.  4. Respiratory failure s/p urgent intubation 5. Ischemic VT/VF s/p multiple defib s/p Amio and Lido bolus  PLAN:  1. CICU, vent support, critical care consulted for vent management, appropriate sedation.  2. IABP support, temp wire  pacing, Heparin gtt 3. ASA Statin, Heparin gtt. NO PLAVIX for potential surgery 4. Continue Amio gtt at 1, Lido gtt at 2.  5. CT Surg consulted   Total critical care time: 60 min  Critical care time was exclusive of separately billable procedures and treating other patients.  Critical care was necessary to treat or prevent imminent or life-threatening deterioration.  Critical care was time spent personally by me on the following activities: development of treatment plan with patient and/or surrogate as well as nursing, discussions with consultants, evaluation of patient's response to treatment, examination of patient, obtaining history from patient or surrogate, ordering and performing treatments and interventions, ordering and review of laboratory studies, ordering and review of radiographic studies, pulse oximetry and re-evaluation of patient's condition.  Signed: Manus Gunning, MD Cardiology Fellow 04/29/2014, 10:29 PM

## 2014-04-29 NOTE — ED Provider Notes (Signed)
CSN: [id number]     Arrival date & time 04/29/14  2117 History   None    Chief Complaint  Patient presents with  . Code STEMI     (Consider location/radiation/quality/duration/timing/severity/associated sxs/prior Treatment) HPI 74 year old male with known coronary artery disease status post CABG who has had multiple MIs in the past who p/w 2.5 hours chest pain. Patient states pain is left sided and does not radiate. Pain is rated as moderate to severe. He has associated shortness of breath, diaphoresis, nausea. Patient states this feels exactly like his heart attacks in the past. EMS was called and patient was found to have initial blood pressure of 944 systolic. His heart rate was in the 30s to 40s. He was given a full dose aspirin and one sublingual nitroglycerin. EKG notable for ST elevation inferiorly. Patient remained otherwise stable in route. He received 500 mL IV fluid bolus.   Past Medical History  Diagnosis Date  . CAD (coronary artery disease)   . History of transient ischemic attack (TIA)   . Hyperlipidemia   . Peripheral neuropathy   . PVD (peripheral vascular disease) with claudication   . Carotid bruit     bilateral  . Preoperative examination   . Bronchitis, acute   . Ventral hernia   . Personal history of prostate cancer   . Diverticulosis   . ALLERGIC RHINITIS   . Osteoarthritis   . Low back pain   . COPD (chronic obstructive pulmonary disease)   . Tobacco use disorder, continuous    Past Surgical History  Procedure Laterality Date  . Coronary artery bypass graft  1992  . Lumbar laminectomy  [id number]     X2  . Prostatectomy  05/2008    Dr. Alinda Money and XRT  . Appendectomy    . Tonsillectomy     Family History  Problem Relation Age of Onset  . Hypertension    . Cancer Mother     Colon  . Heart disease Father   . Coronary artery disease Father   . Cancer Sister   . Heart disease Brother    History  Substance Use Topics  . Smoking status: Current  Every Day Smoker -- 40 years    Types: Cigarettes  . Smokeless tobacco: Not on file  . Alcohol Use: No    Review of Systems  Constitutional: Positive for diaphoresis. Negative for fever, activity change and appetite change.  HENT: Negative for congestion, rhinorrhea and sore throat.   Eyes: Negative for visual disturbance.  Respiratory: Positive for shortness of breath. Negative for cough.   Cardiovascular: Positive for chest pain. Negative for palpitations and leg swelling.  Gastrointestinal: Positive for nausea. Negative for vomiting, abdominal pain and diarrhea.  Genitourinary: Negative for dysuria, flank pain, decreased urine volume and difficulty urinating.  Musculoskeletal: Negative for back pain and neck pain.  Skin: Negative for rash.  Neurological: Negative for dizziness, syncope, speech difficulty, weakness, light-headedness, numbness and headaches.  Psychiatric/Behavioral: Negative for confusion.      Allergies  Codeine sulfate  Home Medications   Prior to Admission medications   Medication Sig Start Date End Date Taking? Authorizing Provider  amLODipine (NORVASC) 5 MG tablet TAKE 1 TABLET DAILY 08/11/13   Burnell Blanks, MD  aspirin 81 MG tablet Take 81 mg by mouth daily.      Historical Provider, MD  Cholecalciferol (VITAMIN D3) 1000 UNIT capsule Take 1,000 Units by mouth daily.      Historical Provider, MD  clobetasol ointment (  TEMOVATE) 3.79 % Apply 1 application topically 2 (two) times daily. 06/23/13   Aleksei Plotnikov V, MD  isosorbide mononitrate (IMDUR) 30 MG 24 hr tablet TAKE 1 TABLET DAILY 08/11/13   Burnell Blanks, MD  loratadine (CLARITIN) 10 MG tablet Take 10 mg by mouth daily as needed. For allergies     Historical Provider, MD  LORazepam (ATIVAN) 0.5 MG tablet Take 0.5 mg by mouth at bedtime.      Historical Provider, MD  metoprolol (LOPRESSOR) 50 MG tablet Take 0.5 tablets (25 mg total) by mouth daily. 08/11/13   Burnell Blanks, MD   Multiple Vitamin (MULTIVITAMIN) tablet Take 1 tablet by mouth daily.      Historical Provider, MD  omeprazole (PRILOSEC) 20 MG capsule Take 20 mg by mouth daily.      Historical Provider, MD  Probiotic Product (PROBIOTIC FORMULA PO) Take 3 tablets by mouth daily.    Historical Provider, MD  simvastatin (ZOCOR) 20 MG tablet TAKE 1 TABLET AT BEDTIME 08/11/13   Burnell Blanks, MD  traMADol (ULTRAM) 50 MG tablet Take 50 mg by mouth as needed.      Historical Provider, MD  triamcinolone ointment (KENALOG) 0.5 % Apply 1 application topically 3 (three) times daily. 04/14/14   Aleksei Plotnikov V, MD   BP 110/48 mmHg  Pulse 40  Temp(Src) 98.2 F (36.8 C)  Resp 20  Ht 5\' 11"  (1.803 m)  Wt 170 lb (77.111 kg)  BMI 23.72 kg/m2  SpO2 100% Physical Exam  Constitutional: He is oriented to person, place, and time. He appears well-developed and well-nourished. No distress.  HENT:  Head: Normocephalic and atraumatic.  Nose: Nose normal.  Mouth/Throat: Oropharynx is clear and moist. No oropharyngeal exudate.  Eyes: Conjunctivae and EOM are normal.  Neck: Normal range of motion. Neck supple. No JVD present.  Cardiovascular: Regular rhythm, normal heart sounds and intact distal pulses.  Bradycardia present.   euvolemic on exam.  Pulmonary/Chest: Effort normal and breath sounds normal. No respiratory distress.  Abdominal: Soft. He exhibits no distension. There is no tenderness. There is no rebound and no guarding.  Musculoskeletal: Normal range of motion.  Neurological: He is alert and oriented to person, place, and time. No cranial nerve deficit.  Skin: Skin is warm and dry. No rash noted.  Psychiatric: He has a normal mood and affect.    ED Course  Procedures (including critical care time) Labs Review Labs Reviewed  I-STAT CHEM 8, ED - Abnormal; Notable for the following:    Glucose, Bld 137 (*)    Calcium, Ion 1.10 (*)    All other components within normal limits  CBC  DIFFERENTIAL   PROTIME-INR  APTT  BASIC METABOLIC PANEL  I-STAT TROPOININ, ED    Imaging Review No results found.   EKG Interpretation   Date/Time:  Thursday April 29 2014 21:25:50 EST Ventricular Rate:  35 PR Interval:    QRS Duration: 120 QT Interval:  509 QTC Calculation: 388 R Axis:   -64 Text Interpretation:  Atrial fibrillation IVCD, consider atypical RBBB  Inferior infarct, acute (RCA) Probable posterior infarct, acute Lateral  leads are also involved Probable RV involvement, suggest recording right  precordial leads Confirmed by WARD,  DO, KRISTEN [phone]) on 04/29/2014  9:33:57 PM      MDM   Final diagnoses:  None   [name] is a 74 y.o. male with H&P as above. Patient presents as code STEMI. On arrival ABCs intact and he has  an initial blood pressure of 354 systolic and heart rate is in the 30s to 50s. Patient is given IV fluids and his blood pressure remained stable. EKG shows ST elevation in inferior leads with reciprocal depression in anterior leads concerning for posterior infarct. Cardiology was present on arrival and patient remained stable in the ED prior to being transferred for PCI.  Clinical Impression: STEMI  Disposition: Admit  Pt seen in conjunction with Dr. Delice Bison Ward, DO  Kirstie Peri, [facility] Resident - PGY-2       Kirstie Peri, MD 04/29/14 [phone]

## 2014-04-29 NOTE — ED Notes (Signed)
Cath Lab ready  

## 2014-04-29 NOTE — ED Provider Notes (Signed)
I saw and evaluated the patient, reviewed the resident's note and I agree with the findings and plan.   EKG Interpretation   Date/Time:  Thursday April 29 2014 21:25:50 EST Ventricular Rate:  35 PR Interval:    QRS Duration: 120 QT Interval:  509 QTC Calculation: 388 R Axis:   -64 Text Interpretation:  Atrial fibrillation IVCD, consider atypical RBBB  Inferior infarct, acute (RCA) Probable posterior infarct, acute Lateral  leads are also involved Probable RV involvement, suggest recording right  precordial leads Confirmed by Tyjay Galindo,  DO, Tija Biss ([id number]) on 04/29/2014  9:33:57 PM        CRITICAL CARE Performed by: Nyra Jabs   Total critical care time: 30 minutes  Critical care time was exclusive of separately billable procedures and treating other patients.  Critical care was necessary to treat or prevent imminent or life-threatening deterioration.  Critical care was time spent personally by me on the following activities: development of treatment plan with patient and/or surrogate as well as nursing, discussions with consultants, evaluation of patient's response to treatment, examination of patient, obtaining history from patient or surrogate, ordering and performing treatments and interventions, ordering and review of laboratory studies, ordering and review of radiographic studies, pulse oximetry and re-evaluation of patient's condition.    Pt is a 74 y.o. M with history of prior CABG in 1993, prior MIs who presents emergency room 2 hours of left-sided chest pressure, shortness of breath, nausea, vomiting, diaphoresis. Take one nitroglycerin at home and was given one nitroglycerin with EMS. Also received aspirin, morphine with EMS, Zofran and IV fluids. He is noted to be bradycardic initially was mild hypotensive. Patient was started on dopamine in the emergency department with improvement of his heart rate and blood pressure. EKG shows STEMI.  Code STEMI was activated prior  to patient's arrival based on patient's EKG sent in by EMS. Dr. Oleta Mouse with cardiology was at bedside upon patient's arrival. Heparin given in the emergency department.  [place], DO 04/30/14 0000

## 2014-04-30 ENCOUNTER — Inpatient Hospital Stay (HOSPITAL_COMMUNITY): Payer: Medicare Other

## 2014-04-30 ENCOUNTER — Encounter (HOSPITAL_COMMUNITY)
Admission: EM | Disposition: A | Discharge status: Skilled Nursing Facility | Payer: Self-pay | Source: Home / Self Care | Attending: Cardiology

## 2014-04-30 ENCOUNTER — Observation Stay (HOSPITAL_COMMUNITY): Admission: EM | Admit: 2014-04-30 | Payer: Medicare Other | Source: Ambulatory Visit | Admitting: Cardiology

## 2014-04-30 ENCOUNTER — Encounter (HOSPITAL_COMMUNITY): Payer: Self-pay | Admitting: Cardiology

## 2014-04-30 DIAGNOSIS — I472 Ventricular tachycardia, unspecified: Secondary | ICD-10-CM

## 2014-04-30 DIAGNOSIS — I442 Atrioventricular block, complete: Secondary | ICD-10-CM | POA: Diagnosis present

## 2014-04-30 DIAGNOSIS — I25110 Atherosclerotic heart disease of native coronary artery with unstable angina pectoris: Secondary | ICD-10-CM

## 2014-04-30 DIAGNOSIS — I213 ST elevation (STEMI) myocardial infarction of unspecified site: Secondary | ICD-10-CM | POA: Diagnosis present

## 2014-04-30 DIAGNOSIS — I2510 Atherosclerotic heart disease of native coronary artery without angina pectoris: Secondary | ICD-10-CM

## 2014-04-30 DIAGNOSIS — I2101 ST elevation (STEMI) myocardial infarction involving left main coronary artery: Secondary | ICD-10-CM

## 2014-04-30 DIAGNOSIS — I469 Cardiac arrest, cause unspecified: Secondary | ICD-10-CM

## 2014-04-30 DIAGNOSIS — R570 Cardiogenic shock: Secondary | ICD-10-CM

## 2014-04-30 DIAGNOSIS — Z9861 Coronary angioplasty status: Secondary | ICD-10-CM

## 2014-04-30 DIAGNOSIS — R079 Chest pain, unspecified: Secondary | ICD-10-CM

## 2014-04-30 DIAGNOSIS — I4901 Ventricular fibrillation: Secondary | ICD-10-CM

## 2014-04-30 HISTORY — PX: LEFT HEART CATHETERIZATION WITH CORONARY ANGIOGRAM: SHX5451

## 2014-04-30 LAB — GLUCOSE, CAPILLARY
Glucose-Capillary: 185 mg/dL — ABNORMAL HIGH (ref 70–99)
Glucose-Capillary: 161 mg/dL — ABNORMAL HIGH (ref 70–99)
Glucose-Capillary: 109 mg/dL — ABNORMAL HIGH (ref 70–99)

## 2014-04-30 LAB — CBC
HEMATOCRIT: 39.2 % (ref 39.0–52.0)
Hemoglobin: 14 g/dL (ref 13.0–17.0)
MCH: 33.2 pg (ref 26.0–34.0)
MCHC: 35.7 g/dL (ref 30.0–36.0)
MCV: 92.9 fL (ref 78.0–100.0)
PLATELETS: 267 10*3/uL (ref 150–400)
RBC: 4.22 MIL/uL (ref 4.22–5.81)
RDW: 12.6 % (ref 11.5–15.5)
WBC: 20.3 10*3/uL — AB (ref 4.0–10.5)

## 2014-04-30 LAB — BASIC METABOLIC PANEL
Anion gap: 9 (ref 5–15)
BUN: 15 mg/dL (ref 6–23)
CALCIUM: 7.6 mg/dL — AB (ref 8.4–10.5)
CO2: 22 mmol/L (ref 19–32)
Chloride: 101 mEq/L (ref 96–112)
Creatinine, Ser: 1.13 mg/dL (ref 0.50–1.35)
GFR calc non Af Amer: 62 mL/min — ABNORMAL LOW (ref >90)
GFR, EST AFRICAN AMERICAN: 71 mL/min — AB (ref >90)
Glucose, Bld: 257 mg/dL — ABNORMAL HIGH (ref 70–99)
POTASSIUM: 3.9 mmol/L (ref 3.5–5.1)
SODIUM: 132 mmol/L — AB (ref 135–145)

## 2014-04-30 LAB — PHOSPHORUS: Phosphorus: 3.1 mg/dL (ref 2.3–4.6)

## 2014-04-30 LAB — POCT ACTIVATED CLOTTING TIME
ACTIVATED CLOTTING TIME: 429 s
ACTIVATED CLOTTING TIME: 933 s
Activated Clotting Time: 515 seconds

## 2014-04-30 LAB — HEPARIN LEVEL (UNFRACTIONATED)
Heparin Unfractionated: 0.61 IU/mL (ref 0.30–0.70)
Heparin Unfractionated: 0.13 IU/mL — ABNORMAL LOW (ref 0.30–0.70)

## 2014-04-30 LAB — BLOOD GAS, ARTERIAL
Acid-base deficit: 5.3 mmol/L — ABNORMAL HIGH (ref 0.0–2.0)
BICARBONATE: 20.2 meq/L (ref 20.0–24.0)
DRAWN BY: 419341
FIO2: 1 %
MECHVT: 600 mL
O2 SAT: 98.9 %
PATIENT TEMPERATURE: 98.6
PEEP: 5 cmH2O
PH ART: 7.283 — AB (ref 7.350–7.450)
RATE: 16 resp/min
TCO2: 21.6 mmol/L (ref 0–100)
pCO2 arterial: 44.2 mmHg (ref 35.0–45.0)
pO2, Arterial: 178 mmHg — ABNORMAL HIGH (ref 80.0–100.0)

## 2014-04-30 LAB — SURGICAL PCR SCREEN
MRSA, PCR: NEGATIVE
Staphylococcus aureus: NEGATIVE

## 2014-04-30 LAB — TROPONIN I
TROPONIN I: 9.65 ng/mL — AB (ref <0.031)
Troponin I: 59.45 ng/mL (ref <0.031)
Troponin I: >80 ng/mL (ref <0.031)

## 2014-04-30 LAB — LACTIC ACID, PLASMA
LACTIC ACID, VENOUS: 0.9 mmol/L (ref 0.5–2.0)
Lactic Acid, Venous: 1.2 mmol/L (ref 0.5–2.0)
Lactic Acid, Venous: 1.5 mmol/L (ref 0.5–2.0)

## 2014-04-30 LAB — MAGNESIUM: MAGNESIUM: 1.8 mg/dL (ref 1.5–2.5)

## 2014-04-30 SURGERY — LEFT HEART CATHETERIZATION WITH CORONARY ANGIOGRAM

## 2014-04-30 MED ORDER — ASPIRIN EC 81 MG PO TBEC: 81.0000 mg | DELAYED_RELEASE_TABLET | Freq: Every day | ORAL | Status: DC

## 2014-04-30 MED ORDER — AMIODARONE HCL IN DEXTROSE 360-4.14 MG/200ML-% IV SOLN
60.0000 mg/h | INTRAVENOUS | Status: AC
  Administered 2014-04-30 (×2): 60 mg/h via INTRAVENOUS
  Filled 2014-04-30: qty 200

## 2014-04-30 MED ORDER — SODIUM CHLORIDE 0.9 % IV SOLN
0.0000 mg/h | INTRAVENOUS | Status: DC
  Administered 2014-04-30 – 2014-05-01 (×3): 4 mg/h via INTRAVENOUS
  Administered 2014-05-02: 2 mg/h via INTRAVENOUS
  Administered 2014-05-04: 1 mg/h via INTRAVENOUS
  Administered 2014-05-04: 2 mg/h via INTRAVENOUS
  Administered 2014-05-05: 1 mg/h via INTRAVENOUS
  Filled 2014-04-30 (×7): qty 10

## 2014-04-30 MED ORDER — CHLORHEXIDINE GLUCONATE 0.12 % MT SOLN
15.0000 mL | Freq: Two times a day (BID) | OROMUCOSAL | Status: DC
  Administered 2014-04-30 – 2014-05-07 (×15): 15 mL via OROMUCOSAL
  Filled 2014-04-30 (×16): qty 15

## 2014-04-30 MED ORDER — NOREPINEPHRINE BITARTRATE 1 MG/ML IV SOLN
2.0000 ug/min | INTRAVENOUS | Status: DC
  Administered 2014-04-30: 20 ug/min via INTRAVENOUS
  Administered 2014-04-30: 2 ug/min via INTRAVENOUS
  Administered 2014-04-30: 14 ug/min via INTRAVENOUS
  Administered 2014-04-30: 20 ug/min via INTRAVENOUS
  Administered 2014-05-02: 5 ug/min via INTRAVENOUS
  Filled 2014-04-30 (×7): qty 4

## 2014-04-30 MED ORDER — ASPIRIN 81 MG PO CHEW
81.0000 mg | CHEWABLE_TABLET | Freq: Every day | ORAL | Status: DC
  Administered 2014-04-30 – 2014-05-05 (×6): 81 mg
  Filled 2014-04-30 (×6): qty 1

## 2014-04-30 MED ORDER — CLOPIDOGREL BISULFATE 75 MG PO TABS: 75.0000 mg | ORAL_TABLET | Freq: Every day | ORAL | Status: DC

## 2014-04-30 MED ORDER — ALBUTEROL SULFATE (2.5 MG/3ML) 0.083% IN NEBU: 2.5000 mg | INHALATION_SOLUTION | RESPIRATORY_TRACT | Status: DC | PRN

## 2014-04-30 MED ORDER — LIDOCAINE HCL (CARDIAC) 20 MG/ML IV SOLN
INTRAVENOUS | Status: AC
  Filled 2014-04-30: qty 5

## 2014-04-30 MED ORDER — SODIUM CHLORIDE 0.9 % IV SOLN
1.0000 mL/kg/h | INTRAVENOUS | Status: AC
Start: 2014-04-30 — End: 2014-04-30
  Administered 2014-04-30: 1 mL/kg/h via INTRAVENOUS

## 2014-04-30 MED ORDER — IPRATROPIUM-ALBUTEROL 0.5-2.5 (3) MG/3ML IN SOLN
3.0000 mL | Freq: Four times a day (QID) | RESPIRATORY_TRACT | Status: DC
  Administered 2014-04-30 – 2014-05-09 (×38): 3 mL via RESPIRATORY_TRACT
  Filled 2014-04-30 (×39): qty 3

## 2014-04-30 MED ORDER — HEPARIN (PORCINE) IN NACL 100-0.45 UNIT/ML-% IJ SOLN
1450.0000 [IU]/h | INTRAMUSCULAR | Status: DC
  Administered 2014-04-30: 700 [IU]/h via INTRAVENOUS
  Administered 2014-05-02: 1000 [IU]/h via INTRAVENOUS
  Administered 2014-05-03: 1450 [IU]/h via INTRAVENOUS
  Filled 2014-04-30 (×7): qty 250

## 2014-04-30 MED ORDER — SODIUM CHLORIDE 0.9 % IV SOLN
INTRAVENOUS | Status: DC
  Administered 2014-04-30: 1000 mL via INTRAVENOUS
  Administered 2014-05-01: 250 mL via INTRAVENOUS
  Administered 2014-05-01: 21:00:00 via INTRAVENOUS
  Administered 2014-05-08: 10 mL/h via INTRAVENOUS
  Administered 2014-05-10: 23:00:00 via INTRAVENOUS

## 2014-04-30 MED ORDER — PANTOPRAZOLE SODIUM 40 MG PO PACK
40.0000 mg | PACK | Freq: Every day | ORAL | Status: DC
  Administered 2014-04-30 – 2014-05-10 (×11): 40 mg
  Filled 2014-04-30 (×12): qty 20

## 2014-04-30 MED ORDER — ATORVASTATIN CALCIUM 80 MG PO TABS
80.0000 mg | ORAL_TABLET | Freq: Every day | ORAL | Status: DC
  Filled 2014-04-30: qty 1

## 2014-04-30 MED ORDER — HEPARIN (PORCINE) IN NACL 100-0.45 UNIT/ML-% IJ SOLN
800.0000 [IU]/h | INTRAMUSCULAR | Status: DC
  Administered 2014-04-30: 800 [IU]/h via INTRAVENOUS
  Filled 2014-04-30: qty 250

## 2014-04-30 MED ORDER — MIDAZOLAM BOLUS VIA INFUSION
1.0000 mg | INTRAVENOUS | Status: DC | PRN
  Administered 2014-04-30 – 2014-05-01 (×2): 2 mg via INTRAVENOUS
  Filled 2014-04-30 (×3): qty 2

## 2014-04-30 MED ORDER — CETYLPYRIDINIUM CHLORIDE 0.05 % MT LIQD: 7.0000 mL | Freq: Four times a day (QID) | OROMUCOSAL | Status: DC

## 2014-04-30 MED ORDER — MAGNESIUM SULFATE 2 GM/50ML IV SOLN
2.0000 g | Freq: Once | INTRAVENOUS | Status: AC
  Administered 2014-04-30: 2 g via INTRAVENOUS
  Filled 2014-04-30: qty 50

## 2014-04-30 MED ORDER — FENTANYL CITRATE 0.05 MG/ML IJ SOLN: 50.0000 ug | Freq: Once | INTRAMUSCULAR | Status: DC

## 2014-04-30 MED ORDER — CHLORHEXIDINE GLUCONATE 0.12 % MT SOLN: 15.0000 mL | Freq: Two times a day (BID) | OROMUCOSAL | Status: DC

## 2014-04-30 MED ORDER — AMIODARONE HCL IN DEXTROSE 360-4.14 MG/200ML-% IV SOLN
30.0000 mg/h | INTRAVENOUS | Status: DC
  Administered 2014-04-30 – 2014-05-06 (×10): 30 mg/h via INTRAVENOUS
  Filled 2014-04-30 (×32): qty 200

## 2014-04-30 MED ORDER — FENTANYL BOLUS VIA INFUSION
25.0000 ug | INTRAVENOUS | Status: DC | PRN
  Administered 2014-04-30 – 2014-05-06 (×6): 25 ug via INTRAVENOUS
  Filled 2014-04-30: qty 25

## 2014-04-30 MED ORDER — SODIUM CHLORIDE 0.9 % IV SOLN
INTRAVENOUS | Status: DC
  Administered 2014-04-30 – 2014-05-01 (×2): via INTRAVENOUS
  Administered 2014-05-01: 1000 mL via INTRAVENOUS
  Administered 2014-05-02: 50 mL/h via INTRAVENOUS

## 2014-04-30 MED ORDER — VANCOMYCIN HCL IN DEXTROSE 1-5 GM/200ML-% IV SOLN
1000.0000 mg | Freq: Once | INTRAVENOUS | Status: AC
  Administered 2014-04-30: 1000 mg via INTRAVENOUS
  Filled 2014-04-30: qty 200

## 2014-04-30 MED ORDER — INSULIN ASPART 100 UNIT/ML ~~LOC~~ SOLN
0.0000 [IU] | SUBCUTANEOUS | Status: DC
  Administered 2014-04-30: 2 [IU] via SUBCUTANEOUS
  Administered 2014-04-30: 1 [IU] via SUBCUTANEOUS
  Administered 2014-04-30: 2 [IU] via SUBCUTANEOUS
  Administered 2014-05-01 (×4): 1 [IU] via SUBCUTANEOUS
  Administered 2014-05-01 – 2014-05-02 (×2): 2 [IU] via SUBCUTANEOUS
  Administered 2014-05-02 – 2014-05-09 (×25): 1 [IU] via SUBCUTANEOUS

## 2014-04-30 MED ORDER — PANTOPRAZOLE SODIUM 40 MG IV SOLR
40.0000 mg | Freq: Every day | INTRAVENOUS | Status: DC
  Filled 2014-04-30: qty 40

## 2014-04-30 MED ORDER — CETYLPYRIDINIUM CHLORIDE 0.05 % MT LIQD
7.0000 mL | Freq: Four times a day (QID) | OROMUCOSAL | Status: DC
  Administered 2014-04-30 – 2014-05-07 (×30): 7 mL via OROMUCOSAL

## 2014-04-30 MED ORDER — SODIUM CHLORIDE 0.9 % IV SOLN
25.0000 ug/h | INTRAVENOUS | Status: DC
  Administered 2014-04-30 – 2014-05-02 (×4): 150 ug/h via INTRAVENOUS
  Administered 2014-05-03: 75 ug/h via INTRAVENOUS
  Administered 2014-05-04 – 2014-05-06 (×2): 50 ug/h via INTRAVENOUS
  Filled 2014-04-30 (×7): qty 50

## 2014-04-30 MED ORDER — ATORVASTATIN CALCIUM 80 MG PO TABS
80.0000 mg | ORAL_TABLET | Freq: Every day | ORAL | Status: DC
  Administered 2014-04-30 – 2014-05-10 (×11): 80 mg
  Filled 2014-04-30 (×12): qty 1

## 2014-04-30 MED ORDER — POTASSIUM CHLORIDE 20 MEQ/15ML (10%) PO SOLN
40.0000 meq | Freq: Once | ORAL | Status: AC
  Administered 2014-04-30: 40 meq via ORAL
  Filled 2014-04-30 (×2): qty 30

## 2014-04-30 MED ORDER — ONDANSETRON HCL 4 MG/2ML IJ SOLN
4.0000 mg | Freq: Four times a day (QID) | INTRAMUSCULAR | Status: DC | PRN
  Administered 2014-05-08: 4 mg via INTRAVENOUS
  Filled 2014-04-30: qty 2

## 2014-04-30 MED ORDER — ACETAMINOPHEN 325 MG PO TABS
650.0000 mg | ORAL_TABLET | ORAL | Status: DC | PRN
  Administered 2014-05-06 – 2014-05-13 (×2): 650 mg via ORAL
  Filled 2014-04-30 (×2): qty 2

## 2014-04-30 MED FILL — Dextrose Inj 5%: INTRAVENOUS | Qty: 1000 | Status: AC

## 2014-04-30 NOTE — Progress Notes (Signed)
ANTICOAGULATION CONSULT NOTE - Initial Consult  Pharmacy Consult for Heparin  Indication: chest pain/ACS, s/p cath, IABP in place  Patient Measurements: Height: 5\' 11"  (180.3 cm) Weight: 170 lb (77.111 kg) IBW/kg (Calculated) : 75.3  Vital Signs: Temp: 98.2 F (36.8 C) (01/21 2133) BP: 151/37 mmHg (01/21 2215) Pulse Rate: 71 (01/21 2230)  Labs:  Recent Labs  04/29/14 2130 04/29/14 2134  HGB 13.9 14.3  HCT 39.1 42.0  PLT 213  --   APTT 31  --   LABPROT 13.4  --   INR 1.01  --   CREATININE 1.15 1.10   Estimated Creatinine Clearance: 61.8 mL/min (by C-G formula based on Cr of 1.1).  Medical History: Past Medical History  Diagnosis Date  . CAD (coronary artery disease)   . History of transient ischemic attack (TIA)   . Hyperlipidemia   . Peripheral neuropathy   . PVD (peripheral vascular disease) with claudication   . Carotid bruit     bilateral  . Preoperative examination   . Bronchitis, acute   . Ventral hernia   . Personal history of prostate cancer   . Diverticulosis   . ALLERGIC RHINITIS   . Osteoarthritis   . Low back pain   . COPD (chronic obstructive pulmonary disease)   . Tobacco use disorder, continuous     Assessment: 74 y/o M with remote h/o CABG, here with STEMI, now s/p cath with IABP insertion, CBC good, renal function ok pre-cath, other labs as above.   Goal of Therapy:  Heparin level 0.2-0.5 units/ml Monitor platelets by anticoagulation protocol: Yes   Plan:  -Continue heparin infusion at 800 units/hr -0900 HL -Daily CBC/HL -Monitor for  bleeding  Narda Bonds 04/30/2014,2:07 AM

## 2014-04-30 NOTE — Progress Notes (Signed)
ANTICOAGULATION CONSULT NOTE - Follow Up Consult  Pharmacy Consult for Heparin Indication: multivessel CAD s/p IABP  Allergies  Allergen Reactions  . Codeine Sulfate Itching    Patient Measurements: Height: 5\' 11"  (180.3 cm) Weight: 171 lb 11.8 oz (77.9 kg) IBW/kg (Calculated) : 75.3 Heparin Dosing Weight: 77kg  Vital Signs: Temp: 99.7 F (37.6 C) (01/22 2000) Temp Source: Oral (01/22 2000) BP: 104/32 mmHg (01/22 2000) Pulse Rate: 71 (01/22 2000)  Labs:  Recent Labs  04/29/14 2130 04/29/14 2134 04/30/14 0255 04/30/14 0845 04/30/14 1415 04/30/14 2011  HGB 13.9 14.3 14.0  --   --   --   HCT 39.1 42.0 39.2  --   --   --   PLT 213  --  267  --   --   --   APTT 31  --   --   --   --   --   LABPROT 13.4  --   --   --   --   --   INR 1.01  --   --   --   --   --   HEPARINUNFRC  --   --   --  0.61  --  0.13*  CREATININE 1.15 1.10 1.13  --   --   --   TROPONINI  --   --  9.65* 59.45* >80.00*  --     Estimated Creatinine Clearance: 60.2 mL/min (by C-G formula based on Cr of 1.13).   Medications:  Heparin @ 600 units/hr  Assessment: 73yom admitted with STEMI, CHB, and subsequent VT arrest was taken emergently to the cath lab and found to have critical LM and ostial LAD/Lcx disease (LAD opened but diseased, no stent) as well as an occluded SVG-->OM. An IABP and temp pacer wire were placed. He is on heparin and heparin level is below goal on 600 units/hr (was noted above goal on 800 units/hr)  Goal of Therapy:  Heparin level goal 0.2-0.5 while IABP in place Monitor platelets by anticoagulation protocol: Yes   Plan:   1) Increase heparin to 700 units/hr 2) Check heparin level in 8 hours  Hildred Laser, Pharm D 04/30/2014 8:47 PM

## 2014-04-30 NOTE — Progress Notes (Addendum)
Subjective:   74 y/o male with previous CABG admitted 1/21 with inferior posterior STEMI c/b CHB and VT arrest. Cath with left dominant circulation. LIMA atretic. SVG -> OM occluded. TVP and IABP placed for shock. TCTS felt not candidate for re-do CABG. Attempted PCI of LM and LCX unable to get wire down LAD due to angle and calcification (LAD open but diseased). POBA LM 90% -> 80%  Lcx 95%->50%.   Remains intubated and sedated. On norepi 20. MAPs 90 (unaugmented) 115 (augmented). Making urine. Occasional NSVT on amio. I turned TVP down an has NSR 65-70 underneath.    Intake/Output Summary (Last 24 hours) at 04/30/14 0808 Last data filed at 04/30/14 0600  Gross per 24 hour  Intake 903.85 ml  Output    500 ml  Net 403.85 ml    Current meds: . antiseptic oral rinse  7 mL Mouth Rinse QID  . [START ON 05/01/2014] aspirin EC  81 mg Oral Daily  . atorvastatin  80 mg Oral q1800  . chlorhexidine  15 mL Mouth Rinse BID  . fentaNYL  50 mcg Intravenous Once  . ipratropium-albuterol  3 mL Nebulization Q6H  . pantoprazole (PROTONIX) IV  40 mg Intravenous Daily   Infusions: . sodium chloride 1 mL/kg/hr (04/30/14 0156)  . sodium chloride    . sodium chloride 1,000 mL (04/30/14 0314)  . amiodarone 30 mg/hr (04/30/14 0718)  . fentaNYL infusion INTRAVENOUS 150 mcg/hr (04/30/14 0311)  . heparin 800 Units/hr (04/30/14 0211)  . midazolam (VERSED) infusion 4 mg/hr (04/30/14 0311)  . norepinephrine (LEVOPHED) Adult infusion 20 mcg/min (04/30/14 0704)     Objective:  Blood pressure 120/53, pulse 79, temperature 97.7 F (36.5 C), temperature source Oral, resp. rate 16, height 5\' 11"  (1.803 m), weight 77.9 kg (171 lb 11.8 oz), SpO2 98 %. Weight change:   Physical Exam: General:  Intubated sedated HEENT: ETT and NGT Neck: supple. JVP 6-7 . Carotids 2+ bilat; no bruits. No lymphadenopathy or thryomegaly appreciated. Cor: PMI nondisplaced. Distant Regular rate & rhythm. No rubs, gallops or  murmurs. Lungs: clear Abdomen: soft, nontender, nondistended. No hepatosplenomegaly. No bruits or masses. Good bowel sounds. Extremities: no cyanosis, clubbing, rash, edema  RFV TVP  Neuro: intubated sedated  Telemetry: NSR 65-70 with PACs/PVCs and brief runs NSVT  Lab Results: Basic Metabolic Panel:  Recent Labs Lab 04/29/14 2130 04/29/14 2134 04/30/14 0255  NA 138 140 132*  K 3.9 3.9 3.9  CL 104 101 101  CO2 26  --  22  GLUCOSE 142* 137* 257*  BUN 15 19 15   CREATININE 1.15 1.10 1.13  CALCIUM 8.7  --  7.6*   Liver Function Tests: No results for input(s): AST, ALT, ALKPHOS, BILITOT, PROT, ALBUMIN in the last 168 hours. No results for input(s): LIPASE, AMYLASE in the last 168 hours. No results for input(s): AMMONIA in the last 168 hours. CBC:  Recent Labs Lab 04/29/14 2130 04/29/14 2134 04/30/14 0255  WBC 8.2  --  20.3*  NEUTROABS 5.0  --   --   HGB 13.9 14.3 14.0  HCT 39.1 42.0 39.2  MCV 92.9  --  92.9  PLT 213  --  267   Cardiac Enzymes:  Recent Labs Lab 04/30/14 0255  TROPONINI 9.65*   BNP: Invalid input(s): POCBNP CBG: No results for input(s): GLUCAP in the last 168 hours. Microbiology: No results found for: CULT No results for input(s): CULT, SDES in the last 168 hours.  Imaging: Portable Chest Xray  04/30/2014  CLINICAL DATA:  Intubation  EXAM: PORTABLE CHEST - 1 VIEW  COMPARISON:  07/21/2009  FINDINGS: Endotracheal tube tip in good position at the clavicular heads. Gastric suction tube is short, as described on abdominal radiography. Interval advancement of aortic balloon pump since previous KUB, tip at the aortic arch. There is a temporary pacer wire from below with lead overlapping ventricles, presumably right ventricle.  No cardiomegaly. Aortic contours are negative. The patient has undergone CABG.  Mild perihilar atelectasis.  No edema or effusion.  No pneumothorax.  IMPRESSION: 1. Unremarkable appearance of endotracheal tube, aortic balloon  pump, and temporary pacer wire. 2. Short nasogastric tube, reference KUB. 3. Perihilar atelectasis.  No pulmonary edema.   Electronically Signed   By: Jorje Guild M.D.   On: 04/30/2014 02:48   Dg Abd Portable 1v  04/30/2014   CLINICAL DATA:  Nasogastric tube repositioning  EXAM: PORTABLE ABDOMEN - 1 VIEW  COMPARISON:  KUB from the same day at 2:14 a.m.  FINDINGS: The nasogastric tube has been advanced into the stomach, with tip in the distal body or antrum. The temporary pacer wire is in stable position, tip over the right ventricle.  IMPRESSION: 1. Nasogastric tube tip has advanced into the distal stomach. 2. Temporary pacer lead over the right ventricle.   Electronically Signed   By: Jorje Guild M.D.   On: 04/30/2014 04:27   Dg Abd Portable 1v  04/30/2014   CLINICAL DATA:  Encounter for nasogastric tube  EXAM: PORTABLE ABDOMEN - 1 VIEW  COMPARISON:  Abdominal CT 11/04/2009  FINDINGS: Nasogastric tube tip is at the proximal stomach, with side port at the lower esophagus. Advancement by 8 cm would provide more secure positioning.  There is an arterial sheath in the left groin with aortic balloon pump at the level of L2. This is been advanced subsequent chest x-ray. Temporary pacer wire via right groin, tip not visualized.  These results were called by telephone at the time of interpretation on 04/30/2014 at 2:45 am to [place] who verbally acknowledged these results.  IMPRESSION: Nasogastric tube tip at the gastric cardia. Advancement by 8 cm would provide more secure positioning.   Electronically Signed   By: Jorje Guild M.D.   On: 04/30/2014 02:45     ASSESSMENT: 1. Acute inferior STEMI s/p POBA left main and LCX on 1/22 (unable to pass wire into LAD)      --left dominant system.       --LM 90% -> 80%      --Lcx 80%->50% 2. CAD s/p CABG      --LIMA atretic     --SVG to OM chronically occluded     --RCA non-dominant 3. Cariogenic shock 4. VT arrest 5. CHB requiring TVP 6. Acute  respiratory failure  7. PAD  9. Hypnatremia  PLAN/DISCUSSION:  He is doing surprisingly well this am. Hemodynamically much improved. Renal function stable Will continue IABP. Wean Norepi for map 65. I turned down TVP and he has NSR underneath. Back-up rate turned to 40. Hopefully can pull later today. Continue ASA/heparin/statin/amio/IABP. Check echo. No b-blocker yet. As he recovers, will need to discuss follow-up revascularization plan with interventional team. Re-do CABG likely best option once more stable so will not use Plavix.   The patient is critically ill with multiple organ systems failure and requires high complexity decision making for assessment and support, frequent evaluation and titration of therapies, application of advanced monitoring technologies and extensive interpretation of multiple databases.   Critical  Care Time devoted to patient care services described in this note is 45 Minutes.     LOS: 1 day  Glori Bickers, MD 04/30/2014, 8:08 AM

## 2014-04-30 NOTE — Progress Notes (Signed)
Peripherally Inserted Central Catheter/Midline Placement  The IV Nurse has discussed with the patient and/or persons authorized to consent for the patient, the purpose of this procedure and the potential benefits and risks involved with this procedure.  The benefits include less needle sticks, lab draws from the catheter and patient may be discharged home with the catheter.  Risks include, but not limited to, infection, bleeding, blood clot (thrombus formation), and puncture of an artery; nerve damage and irregular heat beat.  Alternatives to this procedure were also discussed.  PICC/Midline Placement Documentation     Telephone consent   [name] 04/30/2014, 11:11 AM

## 2014-04-30 NOTE — Progress Notes (Signed)
Attempted right upper arm PICC x1 without success. Unable to pass PICC into SVC. RN aware.

## 2014-04-30 NOTE — Consult Note (Signed)
PULMONARY / CRITICAL CARE MEDICINE   Name: [name] MRN: [id number] DOB: [birth year]    ADMISSION DATE:  04/29/2014 CONSULTATION DATE:  04/30/2014  REFERRING MD :  [place]  CHIEF COMPLAINT:  STEMI  INITIAL PRESENTATION:  74 y.o. M brought to [facility] ED 1/22 with chest pain, SOB, N/V, diaphoresis.  He was found to have bradycardia and acute inferior STEMI and was subsequently taken to the cath lab for emergent PCI.  In cath lab, had worsening cardiogenic shock and developed CHB requiring temp pacer wire as well as IABP.  He proceeded to have multiple episodes of VT / VF requiring defib x 13.  He was intubated and started on Levophed, Amiodarone, Lidocaine.  PCCM was consulted for vent management.   STUDIES:  EKG 1/22 >>> acute inferoposterior STEMI.  SIGNIFICANT EVENTS: 1/22 - taken emergently to cath lab, unable to stent LAD due to significant calcification, LCx and distal left main balloon angioplastied, intubated   HISTORY OF PRESENT ILLNESS:  Pt is encephalopathic; therefore, this HPI is obtained from chart review. MATIX [name] is a 74 y.o. M with PMH as outlined below which includes significant cardiac hx including CAD s/p CABG 1993, PVD s/p PCI to L and R SFA.  He presented to [facility] ED evening of 1/22 with roughly 3 hours of left sided chest pain, pressure like and associated with SOB, N/V and diaphoresis.  He was found to have bradycardia and acute inferior STEMI and was subsequently taken to cath lab for emergent PCI. While in cath lab, he developed progressive cardiogenic shock and complete heart block.  A temporary pacing wire was placed and following this, pt unfortunately had multiple episodes of VT / VF requiring defibrillation x 13.  He was started on Levophed gtt, Amiodarone gtt and Lidocaine gtt and was subsequently intubated for respiratory insufficiency. In cath lab, multiple attempts made to pass wire down LAD without success due to significant calcification.  Did  have successful balloon angioplasty of LCx and distal left main.   PAST MEDICAL HISTORY :   has a past medical history of CAD (coronary artery disease); History of transient ischemic attack (TIA); Hyperlipidemia; Peripheral neuropathy; PVD (peripheral vascular disease) with claudication; Carotid bruit; Preoperative examination; Bronchitis, acute; Ventral hernia; Personal history of prostate cancer; Diverticulosis; ALLERGIC RHINITIS; Osteoarthritis; Low back pain; COPD (chronic obstructive pulmonary disease); and Tobacco use disorder, continuous.  has past surgical history that includes Coronary artery bypass graft (1992); Lumbar laminectomy (2202,5427 ); Prostatectomy (05/2008); Appendectomy; and Tonsillectomy. Prior to Admission medications   Medication Sig Start Date End Date Taking? Authorizing Provider  amLODipine (NORVASC) 5 MG tablet TAKE 1 TABLET DAILY 08/11/13   Burnell Blanks, MD  aspirin 81 MG tablet Take 81 mg by mouth daily.      Historical Provider, MD  Cholecalciferol (VITAMIN D3) 1000 UNIT capsule Take 1,000 Units by mouth daily.      Historical Provider, MD  clobetasol ointment (TEMOVATE) 0.62 % Apply 1 application topically 2 (two) times daily. 06/23/13   Aleksei Plotnikov V, MD  isosorbide mononitrate (IMDUR) 30 MG 24 hr tablet TAKE 1 TABLET DAILY 08/11/13   Burnell Blanks, MD  loratadine (CLARITIN) 10 MG tablet Take 10 mg by mouth daily as needed. For allergies     Historical Provider, MD  LORazepam (ATIVAN) 0.5 MG tablet Take 0.5 mg by mouth at bedtime.      Historical Provider, MD  metoprolol (LOPRESSOR) 50 MG tablet Take 0.5 tablets (25 mg total) by  mouth daily. 08/11/13   Burnell Blanks, MD  Multiple Vitamin (MULTIVITAMIN) tablet Take 1 tablet by mouth daily.      Historical Provider, MD  omeprazole (PRILOSEC) 20 MG capsule Take 20 mg by mouth daily.      Historical Provider, MD  Probiotic Product (PROBIOTIC FORMULA PO) Take 3 tablets by mouth daily.     Historical Provider, MD  simvastatin (ZOCOR) 20 MG tablet TAKE 1 TABLET AT BEDTIME 08/11/13   Burnell Blanks, MD  traMADol (ULTRAM) 50 MG tablet Take 50 mg by mouth as needed.      Historical Provider, MD  triamcinolone ointment (KENALOG) 0.5 % Apply 1 application topically 3 (three) times daily. 04/14/14   Cassandria Anger, MD   Allergies  Allergen Reactions  . Codeine Sulfate     FAMILY HISTORY:  Family History  Problem Relation Age of Onset  . Hypertension    . Cancer Mother     Colon  . Heart disease Father   . Coronary artery disease Father   . Cancer Sister   . Heart disease Brother     SOCIAL HISTORY:  reports that he has been smoking Cigarettes.  He has smoked for the past 40 years. He does not have any smokeless tobacco history on file. He reports that he does not drink alcohol or use illicit drugs.  REVIEW OF SYSTEMS:  Unable to obtain as pt is intubated.  SUBJECTIVE:   VITAL SIGNS: Temp:  [97.7 F (36.5 C)-99 F (37.2 C)] 99 F (37.2 C) (01/22 0800) Pulse Rate:  [40-82] 79 (01/22 0803) Resp:  [16-21] 16 (01/22 0803) BP: (89-153)/(37-93) 120/53 mmHg (01/22 0803) SpO2:  [96 %-100 %] 98 % (01/22 0803) Arterial Line BP: (100-120)/(35-55) 120/52 mmHg (01/22 0800) FiO2 (%):  [40 %-100 %] 40 % (01/22 0803) Weight:  [77.111 kg (170 lb)-78 kg (171 lb 15.3 oz)] 77.9 kg (171 lb 11.8 oz) (01/22 0200) HEMODYNAMICS:   VENTILATOR SETTINGS: Vent Mode:  [-] PRVC FiO2 (%):  [40 %-100 %] 40 % Set Rate:  [16 bmp] 16 bmp Vt Set:  [600 mL] 600 mL PEEP:  [5 cmH20] 5 cmH20 Plateau Pressure:  [11 cmH20-18 cmH20] 18 cmH20 INTAKE / OUTPUT: Intake/Output      01/21 0701 - 01/22 0700 01/22 0701 - 01/23 0700   I.V. (mL/kg) 1126.3 (14.5) 209.1 (2.7)   Total Intake(mL/kg) 1126.3 (14.5) 209.1 (2.7)   Urine (mL/kg/hr) 500 100 (0.5)   Total Output 500 100   Net +626.3 +109.1          PHYSICAL EXAMINATION: General: Chronically ill appearing elderly male, on pressors and  vent support. Neuro: Sedated on vent. HEENT: [place]/AT. PERRL, sclerae anicteric.  ETT in place. Cardiovascular: RRR, no M/R/G.  Lungs: Respirations even and unlabored.  CTA bilaterally, No W/R/R.  On full vent support. Abdomen: BS x 4, soft, NT/ND.  Musculoskeletal: No gross deformities, no edema.  Skin: Intact, warm, no rashes.  LABS:  CBC  Recent Labs Lab 04/29/14 2130 04/29/14 2134 04/30/14 0255  WBC 8.2  --  20.3*  HGB 13.9 14.3 14.0  HCT 39.1 42.0 39.2  PLT 213  --  267   Coag's  Recent Labs Lab 04/29/14 2130  APTT 31  INR 1.01   BMET  Recent Labs Lab 04/29/14 2130 04/29/14 2134 04/30/14 0255  NA 138 140 132*  K 3.9 3.9 3.9  CL 104 101 101  CO2 26  --  22  BUN 15 19 15  CREATININE 1.15 1.10 1.13  GLUCOSE 142* 137* 257*   Electrolytes  Recent Labs Lab 04/29/14 2130 04/30/14 0255  CALCIUM 8.7 7.6*   Sepsis Markers  Recent Labs Lab 04/30/14 0255  LATICACIDVEN 1.2   ABG  Recent Labs Lab 04/30/14 0230  PHART 7.283*  PCO2ART 44.2  PO2ART 178.0*   Liver Enzymes No results for input(s): AST, ALT, ALKPHOS, BILITOT, ALBUMIN in the last 168 hours. Cardiac Enzymes  Recent Labs Lab 04/30/14 0255  TROPONINI 9.65*   Glucose No results for input(s): GLUCAP in the last 168 hours.  Imaging Portable Chest Xray  04/30/2014   CLINICAL DATA:  Intubation  EXAM: PORTABLE CHEST - 1 VIEW  COMPARISON:  07/21/2009  FINDINGS: Endotracheal tube tip in good position at the clavicular heads. Gastric suction tube is short, as described on abdominal radiography. Interval advancement of aortic balloon pump since previous KUB, tip at the aortic arch. There is a temporary pacer wire from below with lead overlapping ventricles, presumably right ventricle.  No cardiomegaly. Aortic contours are negative. The patient has undergone CABG.  Mild perihilar atelectasis.  No edema or effusion.  No pneumothorax.  IMPRESSION: 1. Unremarkable appearance of endotracheal tube,  aortic balloon pump, and temporary pacer wire. 2. Short nasogastric tube, reference KUB. 3. Perihilar atelectasis.  No pulmonary edema.   Electronically Signed   By: Jorje Guild M.D.   On: 04/30/2014 02:48   Dg Abd Portable 1v  04/30/2014   CLINICAL DATA:  Nasogastric tube repositioning  EXAM: PORTABLE ABDOMEN - 1 VIEW  COMPARISON:  KUB from the same day at 2:14 a.m.  FINDINGS: The nasogastric tube has been advanced into the stomach, with tip in the distal body or antrum. The temporary pacer wire is in stable position, tip over the right ventricle.  IMPRESSION: 1. Nasogastric tube tip has advanced into the distal stomach. 2. Temporary pacer lead over the right ventricle.   Electronically Signed   By: Jorje Guild M.D.   On: 04/30/2014 04:27   Dg Abd Portable 1v  04/30/2014   CLINICAL DATA:  Encounter for nasogastric tube  EXAM: PORTABLE ABDOMEN - 1 VIEW  COMPARISON:  Abdominal CT 11/04/2009  FINDINGS: Nasogastric tube tip is at the proximal stomach, with side port at the lower esophagus. Advancement by 8 cm would provide more secure positioning.  There is an arterial sheath in the left groin with aortic balloon pump at the level of L2. This is been advanced subsequent chest x-ray. Temporary pacer wire via right groin, tip not visualized.  These results were called by telephone at the time of interpretation on 04/30/2014 at 2:45 am to [place] who verbally acknowledged these results.  IMPRESSION: Nasogastric tube tip at the gastric cardia. Advancement by 8 cm would provide more secure positioning.   Electronically Signed   By: Jorje Guild M.D.   On: 04/30/2014 02:45     EKG:  Acute inferior STEMI   ASSESSMENT / PLAN:  CARDIOVASCULAR A:  Acute inferior STEMI - s/p emergent PCI with balloon angioplasty to LCx and left main 1/22 Complete heart block - s/p temp wire placement 1/22 Cardiogenic Shock - s/p IABP placement 1/22 Hx HLD, PVD P:  Cardiology following. CVTS consulted, deemed poor  candidate for surgical intervention. Continue heparin gtt, Amiodarone gtt, Lidocaine gtt. Continue Levophed PRN. Goal MAP > 65. Trend troponin / lactate. TTE. Hold outpatient amlodipine, imdur, metoprolol, simvastatin.  PULMONARY OETT 1/22 >>> A: Acute respiratory failure COPD without evidence of exacerbation Tobacco use  disorder P:   Full mechanical support, wean as able. VAP bundle. SBT in AM. DuoNebs / Albuterol. ABG and CXR in AM. Tobacco cessation.  RENAL A:   No acute issues P:   NS @ 75. BMP in AM.  GASTROINTESTINAL A:   GI prophylaxis Nutrition Hx Diverticulosis P:   SUP: Pantoprazole. NPO. TF if remains NPO > 24 hours.  HEMATOLOGIC A:   VTE Prophylaxis P:  SCD's / Heparin gtt. CBC in AM.  INFECTIOUS A:   No indication of infection P:   Monitor clinically.  ENDOCRINE A:   Hyperglycemia P:   SSI if glucose consistently > 180.  NEUROLOGIC A:   Acute metabolic encephalopathy Hx TIA, peripheral neuropathy P:   Sedation:  Fentanyl gtt / Versed gtt. RASS goal: 0 to -1. Daily WUA. Hold outpatient lorazepam.  Family updated: None.  Interdisciplinary Family Meeting v Palliative Care Meeting:  Due by: 1/29.   Montey Hora, [facility] Pgr: [phone]  or [phone] 04/30/2014, 9:23 AM  Attending Note:  I have examined patient, reviewed labs, studies and notes. I have discussed the case with [place], and I agree with the data and plans as amended above. Hx CAD presented with CP and evidence for inferior STEMI. Pt intubated after cardiogenic shock and cardiac arrest in cath lab. Now on pressors and MV. Will need to assess ability to tolerate SBT as his cardiac status stabilizes. Independent critical care time is 50 minutes.   Baltazar Apo, MD, PhD 04/30/2014, 9:24 AM [facility] [phone] or if no answer [phone]

## 2014-04-30 NOTE — Progress Notes (Signed)
  Echocardiogram 2D Echocardiogram has been performed.  [name] M 04/30/2014, 9:28 AM

## 2014-04-30 NOTE — Procedures (Signed)
Central Venous Catheter Insertion Procedure Note HAJIME ASFAW [id number] Oct 11, 1940  Procedure: Insertion of Central Venous Catheter Indications: Assessment of intravascular volume  Procedure Details Consent: Risks of procedure as well as the alternatives and risks of each were explained to the (patient/caregiver).  Consent for procedure obtained. Time Out: Verified patient identification, verified procedure, site/side was marked, verified correct patient position, special equipment/implants available, medications/allergies/relevent history reviewed, required imaging and test results available.  Performed  Maximum sterile technique was used including antiseptics, cap, gloves, gown, hand hygiene, mask and sheet. Skin prep: Chlorhexidine; local anesthetic administered A antimicrobial bonded/coated triple lumen catheter was placed in the right internal jugular vein using the Seldinger technique and u/s guidance.  Evaluation Blood flow good Complications: No apparent complications Patient did tolerate procedure well. Chest X-ray ordered to verify placement.  CXR: pending.  Glori Bickers MD 04/30/2014, 4:32 PM

## 2014-04-30 NOTE — CV Procedure (Signed)
Cardiac Catheterization Procedure Note  Name: [name] MRN: [id number] DOB: 1940/07/29  Procedure: Left Heart Cath, Selective Coronary Angiography, SVG and LIMA angiography, LV angiography,  PTCA of the left main coronary artery. Insertion of temporary pacemaker. Insertion of IABP. Multiple episodes of ventricular fibrillation requiring defibrillation x 13.   Indication: [name] with history of remote CABG in 1993 presents with Acute inferior STEMI associated with cardiogenic shock, complete heart block.    Diagnostic Procedure Details: The right groin was prepped, draped, and anesthetized with 1% lidocaine. Using the modified Seldinger technique, a 6 French sheath was introduced into the right femoral artery. Standard Judkins catheters were used for selective coronary and graft angiography and left ventriculography. Catheter exchanges were performed over a wire.    PROCEDURAL FINDINGS Hemodynamics: AO 109/44 mm Hg LV 118/27 mm Hg  Coronary angiography: Coronary dominance: left  Left mainstem: There is a 90% distal left main stenosis with heavy calcification.   Left anterior descending (LAD): There is an 80% ostial LAD stenosis followed by 2 aneurysmal segments. The remainder of the LAD is without significant disease. The first and second diagonals are without significant disease.   Left circumflex (LCx): The LCx gives off a moderately large OM and then is occluded. There is a 95% ostial LCx stenosis.   Right coronary artery (RCA): The RCA is a nondominant vessel with an anterior takeoff. It is patent.   SVG to OM is occluded chronically.  LIMA to the LAD is atretic.   Left ventriculography: Left ventricular systolic function is abnormal, there is severe inferior wall hypokinesis.  LVEF is estimated at 45%, there is no significant mitral regurgitation   PCI Procedure Note:  During diagnostic angiogram the patient developed progressive cardiogenic shock and complete  heart block. A Venous sheath was placed in the right femoral vein and a temporary transvenous pacemaker was inserted into the RV apex under fluoro guidance. The patient developed multiple episodes of ventricular fibrillation requiring a total of 13 DC shocks. He was loaded with IV amiodarone and lidocaine. He received CPR and pressor support. He required several amps of IV epinephrine. An additional arterial access was obtained via the left femoral artery and an IABP was placed via this access for hemodynamic support. The patient was intubated for control of his airway and ventilator support. Once diagnostic angiograms were performed the case was discussed with Dr. Roxy Manns CT surgery who felt like at his age with redo bypass surgery and shock he would not survive. We elected to attempt PCI of the left main.  The right femoral sheath was upsized to a 7 [place]. Weight-based heparin was given for anticoagulation. Once a therapeutic ACT was achieved, a 7 [place] XBLAD guide catheter was inserted.  A Whisper  coronary guidewire was used to cross the left main into the LCx/first OM.  Multiple attempts were made unsuccessfully to place a wire down the LAD. We attempted to cross with a Prowater, Whisper, Choice PT, Luge, and Miracle 3 wires without success. The LAD was acutely angulated from the left main and heavily calcified. The wire tended to select a tiny side branch. There was also the aneurysmal segment in the LAD. We elected to perform balloon angioplasty into the LCx and the distal Left main. The LCx was initially dilated with a 2.0 mm balloon. It was then dilated with a 2.5 mm angiosculpt balloon. The distal left main was dilated with a 3.0 mm noncompliant balloon. Given our inability to access  the LAD I did not think that stenting was an option since stenting over the LAD would further compromise this vessel.   Following PCI, there was 80% residual stenosis in the left main with 50% residual stenosis in the LCx  ostium. The LAD was unchanged.  There appeared to be TIMI 3 flow. At this point the patient's rhythm stabilized with pacing. He continued to require IV pressors and IABP for hemodynamic support.  The patient was transferred to the ICU for further monitoring and continued support as noted above.  PCI Data: Vessel - Left main/Segment - distal/ ostial LCx Percent Stenosis (pre)  90%/95% TIMI-flow 2 PTCA only Percent Stenosis (post) 80%/50% TIMI-flow (post) 3 Unable to access the LAD with a wire.   Final Conclusions:   1. Critical left main and ostial LAD/LCx disease in a left dominant circulation. Chronic total occlusion of the mid LCx 2. Occluded SVG to OM- chronic 3. Atretic LIMA to the LAD 4. Mild to moderate LV dysfunction. 5. Cardiogenic shock requiring IABP and IV pressor support 6. Recurrent Vfib with CPR and multiple defibrillations. 7. Acute respiratory failure requiring intubation and ventilator support. 8. Complete heart block requiring temporary pacemaker placement.  9. Partially successful PTCA only of the distal left main and ostial LCx.   Recommendations: Transfer to ICU for continued hemodynamic support. He is not a candidate for further PCI. Prognosis is very guarded.   [name], [place]  04/30/2014, 1:11 AM

## 2014-04-30 NOTE — Progress Notes (Signed)
INITIAL NUTRITION ASSESSMENT  DOCUMENTATION CODES Per approved criteria  -Not Applicable   INTERVENTION:  If unable to extubate patient within the next 24 hours, recommend initiate TF via OGT with Vital AF 1.2 at 20 ml/h, increase by 10 ml every 4 hours to goal rate of 60 ml/h with Prostat 30 ml once daily to provide 1828 kcals, 123 gm protein, 1168 ml free water daily.  NUTRITION DIAGNOSIS: Inadequate oral intake related to inability to eat as evidenced by NPO status.  Goal: Intake to meet >90% of estimated nutrition needs.  Monitor:  TF tolerance/adequacy, weight trend, labs, vent status.  Reason for Assessment: VDRF  74 y.o. male  Admitting Dx: STEMI  ASSESSMENT: 74 y.o. M brought [facility] ED 1/22 with chest pain, SOB, N/V, diaphoresis. He was found to have bradycardia and acute inferior STEMI and was subsequently taken to the cath lab for emergent PCI.  Patient is currently intubated on ventilator support MV: 9.3 L/min Temp (24hrs), Avg:98.2 F (36.8 C), Min:97.7 F (36.5 C), Max:99 F (37.2 C)  Propofol: none  Height: Ht Readings from Last 1 Encounters:  04/30/14 5\' 11"  (1.803 m)    Weight: Wt Readings from Last 1 Encounters:  04/30/14 171 lb 11.8 oz (77.9 kg)    Ideal Body Weight: 78.2 kg  % Ideal Body Weight: 100%  Wt Readings from Last 10 Encounters:  04/30/14 171 lb 11.8 oz (77.9 kg)  04/14/14 172 lb (78.019 kg)  01/11/14 168 lb (76.204 kg)  09/02/13 173 lb (78.472 kg)  08/05/13 170 lb 12.8 oz (77.474 kg)  06/23/13 169 lb (76.658 kg)  03/25/13 172 lb (78.019 kg)  12/24/12 171 lb (77.565 kg)  12/04/12 172 lb (78.019 kg)  08/25/12 170 lb (77.111 kg)    Usual Body Weight: 168-172 lbs  % Usual Body Weight: 100%  BMI:  Body mass index is 23.96 kg/(m^2).  Estimated Nutritional Needs: Kcal: 1768 Protein: 110-125 gm Fluid: 2 L  Skin: no issues  Diet Order: Diet NPO time specified  EDUCATION NEEDS: -Education not appropriate at this  time   Intake/Output Summary (Last 24 hours) at 04/30/14 0956 Last data filed at 04/30/14 0931  Gross per 24 hour  Intake 1577.01 ml  Output    675 ml  Net 902.01 ml    Last BM: None documented since admission    Labs:   Recent Labs Lab 04/29/14 2130 04/29/14 2134 04/30/14 0255  NA 138 140 132*  K 3.9 3.9 3.9  CL 104 101 101  CO2 26  --  22  BUN 15 19 15   CREATININE 1.15 1.10 1.13  CALCIUM 8.7  --  7.6*  GLUCOSE 142* 137* 257*    CBG (last 3)  No results for input(s): GLUCAP in the last 72 hours.  Scheduled Meds: . antiseptic oral rinse  7 mL Mouth Rinse QID  . aspirin  81 mg Per Tube Daily  . atorvastatin  80 mg Per Tube q1800  . chlorhexidine  15 mL Mouth Rinse BID  . fentaNYL  50 mcg Intravenous Once  . insulin aspart  0-9 Units Subcutaneous 6 times per day  . ipratropium-albuterol  3 mL Nebulization Q6H  . pantoprazole sodium  40 mg Per Tube Daily    Continuous Infusions: . sodium chloride 75 mL/hr at 04/30/14 0931  . sodium chloride 1,000 mL (04/30/14 0314)  . amiodarone 30 mg/hr (04/30/14 0718)  . fentaNYL infusion INTRAVENOUS 150 mcg/hr (04/30/14 0311)  . heparin 800 Units/hr (04/30/14 0211)  .  midazolam (VERSED) infusion 4 mg/hr (04/30/14 0311)  . norepinephrine (LEVOPHED) Adult infusion 16 mcg/min (04/30/14 0931)    Past Medical History  Diagnosis Date  . CAD (coronary artery disease)   . History of transient ischemic attack (TIA)   . Hyperlipidemia   . Peripheral neuropathy   . PVD (peripheral vascular disease) with claudication   . Carotid bruit     bilateral  . Preoperative examination   . Bronchitis, acute   . Ventral hernia   . Personal history of prostate cancer   . Diverticulosis   . ALLERGIC RHINITIS   . Osteoarthritis   . Low back pain   . COPD (chronic obstructive pulmonary disease)   . Tobacco use disorder, continuous     Past Surgical History  Procedure Laterality Date  . Coronary artery bypass graft  1992  . Lumbar  laminectomy  [id number]     X2  . Prostatectomy  05/2008    Dr. Alinda Money and XRT  . Appendectomy    . Tonsillectomy      Molli Barrows, RD, LDN, [place] Pager [phone] After Hours Pager [phone]

## 2014-04-30 NOTE — Progress Notes (Signed)
ANTICOAGULATION CONSULT NOTE - Follow Up Consult  Pharmacy Consult for Heparin Indication: multivessel CAD s/p IABP  Allergies  Allergen Reactions  . Codeine Sulfate Itching    Patient Measurements: Height: 5\' 11"  (180.3 cm) Weight: 171 lb 11.8 oz (77.9 kg) IBW/kg (Calculated) : 75.3 Heparin Dosing Weight: 77kg  Vital Signs: Temp: 99.6 F (37.6 C) (01/22 1200) Temp Source: Axillary (01/22 1200) BP: 124/69 mmHg (01/22 1200) Pulse Rate: 52 (01/22 1200)  Labs:  Recent Labs  04/29/14 2130 04/29/14 2134 04/30/14 0255 04/30/14 0845  HGB 13.9 14.3 14.0  --   HCT 39.1 42.0 39.2  --   PLT 213  --  267  --   APTT 31  --   --   --   LABPROT 13.4  --   --   --   INR 1.01  --   --   --   HEPARINUNFRC  --   --   --  0.61  CREATININE 1.15 1.10 1.13  --   TROPONINI  --   --  9.65* 59.45*    Estimated Creatinine Clearance: 60.2 mL/min (by C-G formula based on Cr of 1.13).   Medications:  Heparin @ 800 units/hr  Assessment: 73yom admitted with STEMI, CHB, and subsequent VT arrest was taken emergently to the cath lab and found to have critical LM and ostial LAD/Lcx disease (LAD opened but diseased, no stent) as well as an occluded SVG-->OM. An IABP and temp pacer wire were placed. He was started on heparin. Initial heparin level is above goal at 0.61. Hgb/Platelets are stable. No bleeding reported.  Goal of Therapy:  Heparin level goal 0.2-0.5 while IABP in place Monitor platelets by anticoagulation protocol: Yes   Plan:  1) Decrease heparin to 600 units/hr 2) Check heparin level in 8 hours  Deboraha Sprang 04/30/2014,12:46 PM

## 2014-05-01 ENCOUNTER — Inpatient Hospital Stay (HOSPITAL_COMMUNITY): Payer: Medicare Other

## 2014-05-01 DIAGNOSIS — I442 Atrioventricular block, complete: Secondary | ICD-10-CM

## 2014-05-01 DIAGNOSIS — I2101 ST elevation (STEMI) myocardial infarction involving left main coronary artery: Secondary | ICD-10-CM

## 2014-05-01 LAB — BASIC METABOLIC PANEL
ANION GAP: 5 (ref 5–15)
BUN: 11 mg/dL (ref 6–23)
CALCIUM: 7.5 mg/dL — AB (ref 8.4–10.5)
CO2: 25 mmol/L (ref 19–32)
Chloride: 107 mmol/L (ref 96–112)
Creatinine, Ser: 1.08 mg/dL (ref 0.50–1.35)
GFR calc Af Amer: 76 mL/min — ABNORMAL LOW (ref >90)
GFR calc non Af Amer: 65 mL/min — ABNORMAL LOW (ref >90)
Glucose, Bld: 137 mg/dL — ABNORMAL HIGH (ref 70–99)
Potassium: 4.1 mmol/L (ref 3.5–5.1)
Sodium: 137 mmol/L (ref 135–145)

## 2014-05-01 LAB — CBC
HCT: 32.5 % — ABNORMAL LOW (ref 39.0–52.0)
Hemoglobin: 11.3 g/dL — ABNORMAL LOW (ref 13.0–17.0)
MCH: 33.6 pg (ref 26.0–34.0)
MCHC: 34.8 g/dL (ref 30.0–36.0)
MCV: 96.7 fL (ref 78.0–100.0)
Platelets: 125 10*3/uL — ABNORMAL LOW (ref 150–400)
RBC: 3.36 MIL/uL — AB (ref 4.22–5.81)
RDW: 13.2 % (ref 11.5–15.5)
WBC: 8.5 10*3/uL (ref 4.0–10.5)

## 2014-05-01 LAB — HEPARIN LEVEL (UNFRACTIONATED)
Heparin Unfractionated: <0.1 IU/mL — ABNORMAL LOW (ref 0.30–0.70)
Heparin Unfractionated: <0.1 IU/mL — ABNORMAL LOW (ref 0.30–0.70)
Heparin Unfractionated: 0.13 IU/mL — ABNORMAL LOW (ref 0.30–0.70)

## 2014-05-01 LAB — GLUCOSE, CAPILLARY
GLUCOSE-CAPILLARY: 129 mg/dL — AB (ref 70–99)
GLUCOSE-CAPILLARY: 131 mg/dL — AB (ref 70–99)
GLUCOSE-CAPILLARY: 151 mg/dL — AB (ref 70–99)
Glucose-Capillary: 130 mg/dL — ABNORMAL HIGH (ref 70–99)
Glucose-Capillary: 108 mg/dL — ABNORMAL HIGH (ref 70–99)

## 2014-05-01 LAB — OCCULT BLOOD GASTRIC / DUODENUM (SPECIMEN CUP): Occult Blood, Gastric: POSITIVE — AB

## 2014-05-01 LAB — PROCALCITONIN: PROCALCITONIN: 1.17 ng/mL

## 2014-05-01 LAB — LACTIC ACID, PLASMA: Lactic Acid, Venous: 1.1 mmol/L (ref 0.5–2.0)

## 2014-05-01 MED ORDER — PIPERACILLIN-TAZOBACTAM 3.375 G IVPB
3.3750 g | Freq: Three times a day (TID) | INTRAVENOUS | Status: DC
  Administered 2014-05-01 – 2014-05-05 (×14): 3.375 g via INTRAVENOUS
  Filled 2014-05-01 (×16): qty 50

## 2014-05-01 MED ORDER — VANCOMYCIN HCL IN DEXTROSE 750-5 MG/150ML-% IV SOLN
750.0000 mg | Freq: Two times a day (BID) | INTRAVENOUS | Status: DC
  Administered 2014-05-01 – 2014-05-05 (×9): 750 mg via INTRAVENOUS
  Filled 2014-05-01 (×10): qty 150

## 2014-05-01 NOTE — Progress Notes (Signed)
Dr. Lamonte Sakai notified of positive gastric occult.  No new orders. Will continue to monitor.

## 2014-05-01 NOTE — Progress Notes (Signed)
Patient ID: [name], male   DOB: 06-17-1940, 74 y.o.   MRN: [id number]   Subjective:   74 y/o male with previous CABG admitted 1/21 with inferior posterior STEMI c/b CHB and VT arrest. Cath with left dominant circulation. LIMA atretic. SVG -> OM occluded. TVP and IABP placed for shock. TCTS felt not candidate for re-do CABG. Attempted PCI of LM and LCX unable to get wire down LAD due to angle and calcification (LAD open but diseased). POBA LM 90% -> 80%  Lcx 95%->50%.   Intubated on antibiotics for sputum and fever  IABP with good wave forms  Back on levophed for BP support    Intake/Output Summary (Last 24 hours) at 05/01/14 0828 Last data filed at 05/01/14 0800  Gross per 24 hour  Intake 4109.18 ml  Output   2290 ml  Net 1819.18 ml    Current meds: . antiseptic oral rinse  7 mL Mouth Rinse QID  . aspirin  81 mg Per Tube Daily  . atorvastatin  80 mg Per Tube q1800  . chlorhexidine  15 mL Mouth Rinse BID  . fentaNYL  50 mcg Intravenous Once  . insulin aspart  0-9 Units Subcutaneous 6 times per day  . ipratropium-albuterol  3 mL Nebulization Q6H  . pantoprazole sodium  40 mg Per Tube Daily  . piperacillin-tazobactam (ZOSYN)  IV  3.375 g Intravenous 3 times per day  . vancomycin  750 mg Intravenous Q12H   Infusions: . sodium chloride 50 mL/hr at 05/01/14 0747  . sodium chloride 250 mL (05/01/14 0452)  . amiodarone 30 mg/hr (04/30/14 2339)  . fentaNYL infusion INTRAVENOUS 150 mcg/hr (05/01/14 0757)  . heparin 850 Units/hr (05/01/14 0522)  . midazolam (VERSED) infusion 4 mg/hr (05/01/14 0757)  . norepinephrine (LEVOPHED) Adult infusion 2 mcg/min (05/01/14 0758)     Objective:  Blood pressure 143/67, pulse 83, temperature 99.4 F (37.4 C), temperature source Oral, resp. rate 16, height 5\' 11"  (1.803 m), weight 81.8 kg (180 lb 5.4 oz), SpO2 96 %. Weight change: 3.8 kg (8 lb 6 oz)  Physical Exam: General:  Intubated sedated HEENT: ETT and NGT Neck: supple. JVP 6-7  . Carotids 2+ bilat; no bruits. No lymphadenopathy or thryomegaly appreciated. Cor: PMI nondisplaced. Distant Regular rate & rhythm. No rubs, gallops or murmurs. Lungs: clear Abdomen: soft, nontender, nondistended. No hepatosplenomegaly. No bruits or masses. Good bowel sounds. Extremities: no cyanosis, clubbing, rash, edema  RFV TVP  Neuro: intubated sedated  Telemetry: NSR 65-70 with PACs/PVCs and brief runs NSVT  Lab Results: Basic Metabolic Panel:  Recent Labs Lab 04/29/14 2130 04/29/14 2134 04/30/14 0255 04/30/14 0845 05/01/14 0430  NA 138 140 132*  --  137  K 3.9 3.9 3.9  --  4.1  CL 104 101 101  --  107  CO2 26  --  22  --  25  GLUCOSE 142* 137* 257*  --  137*  BUN 15 19 15   --  11  CREATININE 1.15 1.10 1.13  --  1.08  CALCIUM 8.7  --  7.6*  --  7.5*  MG  --   --   --  1.8  --   PHOS  --   --   --  3.1  --    Liver Function Tests: No results for input(s): AST, ALT, ALKPHOS, BILITOT, PROT, ALBUMIN in the last 168 hours. No results for input(s): LIPASE, AMYLASE in the last 168 hours. No results for input(s): AMMONIA in the last  168 hours. CBC:  Recent Labs Lab 04/29/14 2130 04/29/14 2134 04/30/14 0255 05/01/14 0430  WBC 8.2  --  20.3* 8.5  NEUTROABS 5.0  --   --   --   HGB 13.9 14.3 14.0 11.3*  HCT 39.1 42.0 39.2 32.5*  MCV 92.9  --  92.9 96.7  PLT 213  --  267 125*   Cardiac Enzymes:  Recent Labs Lab 04/30/14 0255 04/30/14 0845 04/30/14 1415  TROPONINI 9.65* 59.45* >80.00*   BNP: Invalid input(s): POCBNP CBG:  Recent Labs Lab 04/30/14 1128 04/30/14 1543 04/30/14 1956 04/30/14 2322 05/01/14 0438  GLUCAP 161* 109* 130* 108* 129*   Microbiology: No results found for: CULT No results for input(s): CULT, SDES in the last 168 hours.  Imaging: Portable Chest Xray  05/01/2014   CLINICAL DATA:  Intubation  EXAM: PORTABLE CHEST - 1 VIEW  COMPARISON:  04/30/2014  FINDINGS: Endotracheal tube tip is just below the clavicular heads. The gastric  suction tube at least reaches the diaphragm. Right IJ catheter is in stable position. The aortic balloon pump is at the level of the aortic arch, inflated at time of imaging.  Heart size remains normal. There is a persistent retrocardiac opacity, likely atelectasis and pleural fluid given the myocardial infarction presentation. No pulmonary edema. No pneumothorax.  IMPRESSION: 1. Unchanged positioning of tubes and lines. 2. Stable retrocardiac atelectasis and small effusion.   Electronically Signed   By: Jorje Guild M.D.   On: 05/01/2014 06:41   Dg Chest Port 1 View  04/30/2014   CLINICAL DATA:  Central line placement.  EXAM: PORTABLE CHEST - 1 VIEW  COMPARISON:  None.  FINDINGS: Endotracheal tube, NG tube, right IJ line, mediastinal drainage catheter in stable position. Balloon pump noted with tip at upper portion of descending thoracic aorta. Prior CABG. Left lower lobe infiltrate. Small left pleural effusion. No acute bony abnormality.  IMPRESSION: 1. Interim placement of right IJ line. Tip in good anatomic position. Remaining lines and tubes in stable position. Balloon pump tip at the upper portion of the descending thoracic aorta. 2. Mild left lower lobe infiltrate and small left pleural effusion.   Electronically Signed   By: Marcello Moores  Register   On: 04/30/2014 16:47   Portable Chest Xray  04/30/2014   CLINICAL DATA:  Intubation  EXAM: PORTABLE CHEST - 1 VIEW  COMPARISON:  07/21/2009  FINDINGS: Endotracheal tube tip in good position at the clavicular heads. Gastric suction tube is short, as described on abdominal radiography. Interval advancement of aortic balloon pump since previous KUB, tip at the aortic arch. There is a temporary pacer wire from below with lead overlapping ventricles, presumably right ventricle.  No cardiomegaly. Aortic contours are negative. The patient has undergone CABG.  Mild perihilar atelectasis.  No edema or effusion.  No pneumothorax.  IMPRESSION: 1. Unremarkable appearance  of endotracheal tube, aortic balloon pump, and temporary pacer wire. 2. Short nasogastric tube, reference KUB. 3. Perihilar atelectasis.  No pulmonary edema.   Electronically Signed   By: Jorje Guild M.D.   On: 04/30/2014 02:48   Dg Abd Portable 1v  04/30/2014   CLINICAL DATA:  Nasogastric tube repositioning  EXAM: PORTABLE ABDOMEN - 1 VIEW  COMPARISON:  KUB from the same day at 2:14 a.m.  FINDINGS: The nasogastric tube has been advanced into the stomach, with tip in the distal body or antrum. The temporary pacer wire is in stable position, tip over the right ventricle.  IMPRESSION: 1. Nasogastric tube tip  has advanced into the distal stomach. 2. Temporary pacer lead over the right ventricle.   Electronically Signed   By: Jorje Guild M.D.   On: 04/30/2014 04:27   Dg Abd Portable 1v  04/30/2014   CLINICAL DATA:  Encounter for nasogastric tube  EXAM: PORTABLE ABDOMEN - 1 VIEW  COMPARISON:  Abdominal CT 11/04/2009  FINDINGS: Nasogastric tube tip is at the proximal stomach, with side port at the lower esophagus. Advancement by 8 cm would provide more secure positioning.  There is an arterial sheath in the left groin with aortic balloon pump at the level of L2. This is been advanced subsequent chest x-ray. Temporary pacer wire via right groin, tip not visualized.  These results were called by telephone at the time of interpretation on 04/30/2014 at 2:45 am to Alexander who verbally acknowledged these results.  IMPRESSION: Nasogastric tube tip at the gastric cardia. Advancement by 8 cm would provide more secure positioning.   Electronically Signed   By: Jorje Guild M.D.   On: 04/30/2014 02:45     ASSESSMENT: 1. Acute inferior STEMI s/p POBA left main and LCX on 1/22 (unable to pass wire into LAD)      --left dominant system.       --LM 90% -> 80%      --Lcx 80%->50% 2. CAD s/p CABG      --LIMA atretic     --SVG to OM chronically occluded     --RCA non-dominant 3. Cariogenic shock 4. VT  arrest 5. CHB requiring TVP 6. Acute respiratory failure  7. PAD  9. Hypnatremia  PLAN/DISCUSSION:  He is stable  Renal function stable Will continue IABP. Wean Norepi for map 59  But if septic may not be able to do this.  TPM is out and rhythm is stable ECG indicates afib but telemetry SR rate 80''s with PAC;s  Continue ASA/heparin/statin/amio/IABP. Check echo. Once extubated and more stable will need to see if he is a redo CABG candidate  CCM managing vent and antibiotics  Prognosis still guarded       LOS: 2 days  Jenkins Rouge, MD 05/01/2014, 8:28 AM

## 2014-05-01 NOTE — Progress Notes (Signed)
RN calling eMD  Febrile and hypotensive . Needing to go back on levophed Reports tracheal secretions - tannish color and reddish Difficult CVL access earlier  PLAN Sepsis biomarkers Blood and tracheal aspirate culture Vanc and Zosyn empiric  Dr. Brand Males, M.D., [facility].C.P Pulmonary and Critical Care Medicine Staff Physician [place] [facility] Pager: [phone], If no answer or between  15:00h - 7:00h: call [phone]  05/01/2014 12:13 AM

## 2014-05-01 NOTE — Progress Notes (Signed)
Collected sputum and sending to lab.

## 2014-05-01 NOTE — Progress Notes (Signed)
PULMONARY / CRITICAL CARE MEDICINE   Name: [name] MRN: [id number] DOB: 12-04-1940    ADMISSION DATE:  04/29/2014 CONSULTATION DATE:  05/01/2014  REFERRING MD :  [place]  CHIEF COMPLAINT:  STEMI  INITIAL PRESENTATION:  74 y.o. M brought to [facility] ED 1/22 with chest pain, SOB, N/V, diaphoresis.  He was found to have bradycardia and acute inferior STEMI and was subsequently taken to the cath lab for emergent PCI.  In cath lab, had worsening cardiogenic shock and developed CHB requiring temp pacer wire as well as IABP.  He proceeded to have multiple episodes of VT / VF requiring defib x 13.  He was intubated and started on Levophed, Amiodarone, Lidocaine.  PCCM was consulted for vent management.   STUDIES:  EKG 1/22 >>> acute inferoposterior STEMI.  SIGNIFICANT EVENTS: 1/22 - taken emergently to cath lab, unable to stent LAD due to significant calcification, LCx and distal left main balloon angioplastied, intubated   HISTORY OF PRESENT ILLNESS:  Pt is encephalopathic; therefore, this HPI is obtained from chart review. [name] is a 74 y.o. M with PMH as outlined below which includes significant cardiac hx including CAD s/p CABG 1993, PVD s/p PCI to L and R SFA.  He presented to [facility] ED evening of 1/22 with roughly 3 hours of left sided chest pain, pressure like and associated with SOB, N/V and diaphoresis.  He was found to have bradycardia and acute inferior STEMI and was subsequently taken to cath lab for emergent PCI. While in cath lab, he developed progressive cardiogenic shock and complete heart block.  A temporary pacing wire was placed and following this, pt unfortunately had multiple episodes of VT / VF requiring defibrillation x 13.  He was started on Levophed gtt, Amiodarone gtt and Lidocaine gtt and was subsequently intubated for respiratory insufficiency. In cath lab, multiple attempts made to pass wire down LAD without success due to significant calcification.  Did  have successful balloon angioplasty of LCx and distal left main.  SUBJECTIVE:  Febrile o/n, vanco and zosyn started and cx sent IABP at 1:1 Interacted and followed commands on WUA this am  VITAL SIGNS: Temp:  [99 F (37.2 C)-102.2 F (39 C)] 100.7 F (38.2 C) (01/23 0400) Pulse Rate:  [52-148] 66 (01/23 0715) Resp:  [15-25] 16 (01/23 0715) BP: (96-133)/(24-72) 108/44 mmHg (01/23 0400) SpO2:  [92 %-100 %] 100 % (01/23 0715) Arterial Line BP: (93-180)/(35-91) 124/45 mmHg (01/23 0715) FiO2 (%):  [40 %] 40 % (01/23 0600) Weight:  [81.8 kg (180 lb 5.4 oz)] 81.8 kg (180 lb 5.4 oz) (01/23 0442) HEMODYNAMICS:   VENTILATOR SETTINGS: Vent Mode:  [-] PRVC FiO2 (%):  [40 %] 40 % Set Rate:  [16 bmp] 16 bmp Vt Set:  [600 mL] 600 mL PEEP:  [5 cmH20] 5 cmH20 Plateau Pressure:  [17 cmH20-18 cmH20] 18 cmH20 INTAKE / OUTPUT: Intake/Output      01/22 0701 - 01/23 0700 01/23 0701 - 01/24 0700   I.V. (mL/kg) 3386.4 (41.4)    IV Piggyback 300    Total Intake(mL/kg) 3686.4 (45.1)    Urine (mL/kg/hr) 1205 (0.6)    Emesis/NG output 700 (0.4) 400 (7.6)   Total Output 1905 400   Net +1781.4 -400          PHYSICAL EXAMINATION: General: Chronically ill appearing elderly male, on pressors and vent support. Neuro: Sedated on vent. Followed commands this am 1/23 HEENT: [place]/AT. PERRL, sclerae anicteric.  ETT in place. Cardiovascular: IABP  sounds Lungs: Respirations even and unlabored.  CTA bilaterally, No wheeze Abdomen: BS x 4, soft, NT/ND.  Musculoskeletal: No gross deformities, no edema.  Skin: Intact, warm, no rashes.  LABS:  CBC  Recent Labs Lab 04/29/14 2130 04/29/14 2134 04/30/14 0255 05/01/14 0430  WBC 8.2  --  20.3* 8.5  HGB 13.9 14.3 14.0 11.3*  HCT 39.1 42.0 39.2 32.5*  PLT 213  --  267 125*   Coag's  Recent Labs Lab 04/29/14 2130  APTT 31  INR 1.01   BMET  Recent Labs Lab 04/29/14 2130 04/29/14 2134 04/30/14 0255 05/01/14 0430  NA 138 140 132* 137  K 3.9  3.9 3.9 4.1  CL 104 101 101 107  CO2 26  --  22 25  BUN 15 19 15 11   CREATININE 1.15 1.10 1.13 1.08  GLUCOSE 142* 137* 257* 137*   Electrolytes  Recent Labs Lab 04/29/14 2130 04/30/14 0255 04/30/14 0845 05/01/14 0430  CALCIUM 8.7 7.6*  --  7.5*  MG  --   --  1.8  --   PHOS  --   --  3.1  --    Sepsis Markers  Recent Labs Lab 04/30/14 0815 04/30/14 1415 05/01/14 0043 05/01/14 0044  LATICACIDVEN 1.5 0.9  --  1.1  PROCALCITON  --   --  1.17  --    ABG  Recent Labs Lab 04/30/14 0230  PHART 7.283*  PCO2ART 44.2  PO2ART 178.0*   Liver Enzymes No results for input(s): AST, ALT, ALKPHOS, BILITOT, ALBUMIN in the last 168 hours. Cardiac Enzymes  Recent Labs Lab 04/30/14 0255 04/30/14 0845 04/30/14 1415  TROPONINI 9.65* 59.45* >80.00*   Glucose  Recent Labs Lab 04/30/14 0910 04/30/14 1128 04/30/14 1543  GLUCAP 185* 161* 109*    Imaging Portable Chest Xray  05/01/2014   CLINICAL DATA:  Intubation  EXAM: PORTABLE CHEST - 1 VIEW  COMPARISON:  04/30/2014  FINDINGS: Endotracheal tube tip is just below the clavicular heads. The gastric suction tube at least reaches the diaphragm. Right IJ catheter is in stable position. The aortic balloon pump is at the level of the aortic arch, inflated at time of imaging.  Heart size remains normal. There is a persistent retrocardiac opacity, likely atelectasis and pleural fluid given the myocardial infarction presentation. No pulmonary edema. No pneumothorax.  IMPRESSION: 1. Unchanged positioning of tubes and lines. 2. Stable retrocardiac atelectasis and small effusion.   Electronically Signed   By: Jorje Guild M.D.   On: 05/01/2014 06:41   Dg Chest Port 1 View  04/30/2014   CLINICAL DATA:  Central line placement.  EXAM: PORTABLE CHEST - 1 VIEW  COMPARISON:  None.  FINDINGS: Endotracheal tube, NG tube, right IJ line, mediastinal drainage catheter in stable position. Balloon pump noted with tip at upper portion of descending  thoracic aorta. Prior CABG. Left lower lobe infiltrate. Small left pleural effusion. No acute bony abnormality.  IMPRESSION: 1. Interim placement of right IJ line. Tip in good anatomic position. Remaining lines and tubes in stable position. Balloon pump tip at the upper portion of the descending thoracic aorta. 2. Mild left lower lobe infiltrate and small left pleural effusion.   Electronically Signed   By: Marcello Moores  Register   On: 04/30/2014 16:47   Portable Chest Xray  04/30/2014   CLINICAL DATA:  Intubation  EXAM: PORTABLE CHEST - 1 VIEW  COMPARISON:  07/21/2009  FINDINGS: Endotracheal tube tip in good position at the clavicular heads. Gastric suction tube is  short, as described on abdominal radiography. Interval advancement of aortic balloon pump since previous KUB, tip at the aortic arch. There is a temporary pacer wire from below with lead overlapping ventricles, presumably right ventricle.  No cardiomegaly. Aortic contours are negative. The patient has undergone CABG.  Mild perihilar atelectasis.  No edema or effusion.  No pneumothorax.  IMPRESSION: 1. Unremarkable appearance of endotracheal tube, aortic balloon pump, and temporary pacer wire. 2. Short nasogastric tube, reference KUB. 3. Perihilar atelectasis.  No pulmonary edema.   Electronically Signed   By: Jorje Guild M.D.   On: 04/30/2014 02:48   Dg Abd Portable 1v  04/30/2014   CLINICAL DATA:  Nasogastric tube repositioning  EXAM: PORTABLE ABDOMEN - 1 VIEW  COMPARISON:  KUB from the same day at 2:14 a.m.  FINDINGS: The nasogastric tube has been advanced into the stomach, with tip in the distal body or antrum. The temporary pacer wire is in stable position, tip over the right ventricle.  IMPRESSION: 1. Nasogastric tube tip has advanced into the distal stomach. 2. Temporary pacer lead over the right ventricle.   Electronically Signed   By: Jorje Guild M.D.   On: 04/30/2014 04:27   Dg Abd Portable 1v  04/30/2014   CLINICAL DATA:  Encounter  for nasogastric tube  EXAM: PORTABLE ABDOMEN - 1 VIEW  COMPARISON:  Abdominal CT 11/04/2009  FINDINGS: Nasogastric tube tip is at the proximal stomach, with side port at the lower esophagus. Advancement by 8 cm would provide more secure positioning.  There is an arterial sheath in the left groin with aortic balloon pump at the level of L2. This is been advanced subsequent chest x-ray. Temporary pacer wire via right groin, tip not visualized.  These results were called by telephone at the time of interpretation on 04/30/2014 at 2:45 am to Ferguson who verbally acknowledged these results.  IMPRESSION: Nasogastric tube tip at the gastric cardia. Advancement by 8 cm would provide more secure positioning.   Electronically Signed   By: Jorje Guild M.D.   On: 04/30/2014 02:45     EKG:  Acute inferior STEMI   ASSESSMENT / PLAN:  CARDIOVASCULAR A:  Acute inferior STEMI - s/p emergent PCI with balloon angioplasty to LCx and left main 1/22 Complete heart block - s/p temp wire placement 1/22 Cardiogenic Shock - s/p IABP placement 1/22, currently 1:1 Hx HLD, PVD P:  Cardiology following. Wean IABP augmentation per cardiology plans CVTS consulted, not clear that he will be a redo-CABG candidate but will follow his progress, may be an option Continue heparin gtt, Amiodarone gtt Continue Levophed, currently at 5 Hold outpatient amlodipine, imdur, metoprolol, simvastatin.  PULMONARY OETT 1/22 >>> A: Acute respiratory failure COPD without evidence of exacerbation Tobacco use disorder P:   Full mechanical support, no plans for extubation until more hemodynamically stable and we know whether he would possibly go to OR VAP bundle. DuoNebs / Albuterol. Follow ABG and CXR Tobacco cessation.  RENAL A:   No acute issues P:   NS @ 75 >> 50 BMP in AM.  GASTROINTESTINAL A:   GI prophylaxis Nutrition Hx Diverticulosis P:   SUP: Pantoprazole. NPO. TF if GI output decreases  1/24  HEMATOLOGIC A:   VTE Prophylaxis P:  SCD's / Heparin gtt. CBC in AM.  INFECTIOUS A:   Fever with purulent secretions, possible PNA P:   vanco 1/23 >>  Zosyn 1/23 >>   Follow cx data and CXR  ENDOCRINE A:  Hyperglycemia P:   SSI if glucose consistently > 180.  NEUROLOGIC A:   Acute metabolic encephalopathy Hx TIA, peripheral neuropathy P:   Sedation:  Fentanyl gtt / Versed gtt. RASS goal: 0 to -1. Daily WUA. Hold outpatient lorazepam.  Family updated: None.  Interdisciplinary Family Meeting v Palliative Care Meeting:  Due by: 1/29.  Independent critical care time is 35 minutes.   Baltazar Apo, MD, PhD 05/01/2014, 7:39 AM [facility] [phone] or if no answer [phone]

## 2014-05-01 NOTE — Progress Notes (Signed)
ANTIBIOTIC CONSULT NOTE - INITIAL  Pharmacy Consult for Vancomycin/Zosyn  Indication: rule out sepsis  Allergies  Allergen Reactions  . Codeine Sulfate Itching    Patient Measurements: Height: 5\' 11"  (180.3 cm) Weight: 171 lb 11.8 oz (77.9 kg) IBW/kg (Calculated) : 75.3 Vital Signs: Temp: 102.2 F (39 C) (01/22 2352) Temp Source: Oral (01/22 2352) BP: 104/32 mmHg (01/22 2000) Pulse Rate: 79 (01/22 2352) Intake/Output from previous day: 01/22 0701 - 01/23 0700 In: 2797.3 [I.V.:2547.3; IV Piggyback:250] Out: 920 [Urine:920] Intake/Output from this shift: Total I/O In: 505.4 [I.V.:505.4] Out: 225 [Urine:225]  Labs:  Recent Labs  04/29/14 2130 04/29/14 2134 04/30/14 0255  WBC 8.2  --  20.3*  HGB 13.9 14.3 14.0  PLT 213  --  267  CREATININE 1.15 1.10 1.13   Estimated Creatinine Clearance: 60.2 mL/min (by C-G formula based on Cr of 1.13).   Medical History: Past Medical History  Diagnosis Date  . CAD (coronary artery disease)   . History of transient ischemic attack (TIA)   . Hyperlipidemia   . Peripheral neuropathy   . PVD (peripheral vascular disease) with claudication   . Carotid bruit     bilateral  . Preoperative examination   . Bronchitis, acute   . Ventral hernia   . Personal history of prostate cancer   . Diverticulosis   . ALLERGIC RHINITIS   . Osteoarthritis   . Low back pain   . COPD (chronic obstructive pulmonary disease)   . Tobacco use disorder, continuous     Assessment: Pt admitted 1/21, has IABP in place, pt is now febrile at 102.2, hypotensive requiring pressor support, WBC elevated at 20.3, CXR shows LLL infiltrate, other labs as above. Starting broad spectrum anti-biotics and getting pan-cultures.   Goal of Therapy:  Vancomycin trough level 15-20 mcg/ml  Plan:  -Vancomycin 750 mg IV q12h (received vancomycin x 1 ~1800 on 1/22) -Zosyn 3.375G IV q8h to be infused over 4 hours -Trend WBC, temp, renal function  -Drug levels as  indicated  -F/U cultures    Narda Bonds 05/01/2014,12:20 AM

## 2014-05-01 NOTE — Progress Notes (Signed)
RT tried to get a sputum sample from Pt. Nothing came up with two attempts. The trap is in the inline suction for sample when Pt has secretions. Will send sample to lab as soon as we can get it. RN aware.

## 2014-05-01 NOTE — Progress Notes (Signed)
ANTICOAGULATION CONSULT NOTE - Follow Up Consult  Pharmacy Consult for heparin Indication: ACS, s/p cath, IABP in place  Allergies  Allergen Reactions  . Codeine Sulfate Itching    Patient Measurements: Height: 5\' 11"  (180.3 cm) Weight: 180 lb 5.4 oz (81.8 kg) IBW/kg (Calculated) : 75.3  Vital Signs: Temp: 101.3 F (38.5 C) (01/23 1300) Temp Source: Oral (01/23 1300) BP: 111/43 mmHg (01/23 1227) Pulse Rate: 66 (01/23 1300)  Labs:  Recent Labs  04/29/14 2130 04/29/14 2134 04/30/14 0255 04/30/14 0845 04/30/14 1415 04/30/14 2011 05/01/14 0430  HGB 13.9 14.3 14.0  --   --   --  11.3*  HCT 39.1 Nathan.0 39.2  --   --   --  32.5*  PLT 213  --  267  --   --   --  125*  APTT 31  --   --   --   --   --   --   LABPROT 13.4  --   --   --   --   --   --   INR 1.01  --   --   --   --   --   --   HEPARINUNFRC  --   --   --  0.61  --  0.13* <0.10*  CREATININE 1.15 1.10 1.13  --   --   --  1.08  TROPONINI  --   --  9.65* 59.45* >80.00*  --   --     Estimated Creatinine Clearance: 62.9 mL/min (by C-G formula based on Cr of 1.08).   Medications:  Scheduled:  . antiseptic oral rinse  7 mL Mouth Rinse QID  . aspirin  81 mg Per Tube Daily  . atorvastatin  80 mg Per Tube q1800  . chlorhexidine  15 mL Mouth Rinse BID  . fentaNYL  50 mcg Intravenous Once  . insulin aspart  0-9 Units Subcutaneous 6 times per day  . ipratropium-albuterol  3 mL Nebulization Q6H  . pantoprazole sodium  40 mg Per Tube Daily  . piperacillin-tazobactam (ZOSYN)  IV  3.375 g Intravenous 3 times per day  . vancomycin  750 mg Intravenous Q12H   Infusions:  . sodium chloride 50 mL/hr at 05/01/14 0747  . sodium chloride 250 mL (05/01/14 0452)  . amiodarone 30 mg/hr (05/01/14 1139)  . fentaNYL infusion INTRAVENOUS 150 mcg/hr (05/01/14 0757)  . heparin 850 Units/hr (05/01/14 0522)  . midazolam (VERSED) infusion 2 mg/hr (05/01/14 1317)  . norepinephrine (LEVOPHED) Adult infusion 5 mcg/min (05/01/14 1001)     Assessment: 74 yo [name] admitted with STEMI and taken to cath.  Developed CHB and subsequent VT arrest in cath lab and found to have severe disease. IABP and temp pacer wire were placed. Pharmacy is consulted to dose heparin. HL today was undetectable again. Hgb 11.3, plts 125. Per RN, no trouble infusing, but positive gastic occult. Will increase just slightly from 850 units/hr to 900 units/hr.   Goal of Therapy:  Heparin level 0.2-0.5 units/ml Monitor platelets by anticoagulation protocol: Yes   Plan:  Increase heparin infusion to 900 units/hr HL @ 2200 Monitor closely for more bleeding Monitor hgb/plts, s/s of bleeding, clinical course  Jabez Molner L. Nicole Kindred, PharmD Clinical Pharmacy Resident Pager: [phone] 05/01/2014 2:08 PM

## 2014-05-01 NOTE — Progress Notes (Signed)
ANTICOAGULATION CONSULT NOTE   Pharmacy Consult for Heparin  Indication: chest pain/ACS, s/p cath, IABP in place  Patient Measurements: Height: 5\' 11"  (180.3 cm) Weight: 180 lb 5.4 oz (81.8 kg) IBW/kg (Calculated) : 75.3  Vital Signs: Temp: 100.9 F (38.3 C) (01/23 2000) Temp Source: Axillary (01/23 2000) BP: 134/64 mmHg (01/23 2000) Pulse Rate: 69 (01/23 2300)  Labs:  Recent Labs  04/29/14 2130 04/29/14 2134 04/30/14 0255 04/30/14 0845 04/30/14 1415  05/01/14 0430 05/01/14 1310 05/01/14 2245  HGB 13.9 14.3 14.0  --   --   --  11.3*  --   --   HCT 39.1 42.0 39.2  --   --   --  32.5*  --   --   PLT 213  --  267  --   --   --  125*  --   --   APTT 31  --   --   --   --   --   --   --   --   LABPROT 13.4  --   --   --   --   --   --   --   --   INR 1.01  --   --   --   --   --   --   --   --   HEPARINUNFRC  --   --   --  0.61  --   < > <0.10* <0.10* 0.13*  CREATININE 1.15 1.10 1.13  --   --   --  1.08  --   --   TROPONINI  --   --  9.65* 59.45* >80.00*  --   --   --   --   < > = values in this interval not displayed. Estimated Creatinine Clearance: 62.9 mL/min (by C-G formula based on Cr of 1.08).  Medical History: Past Medical History  Diagnosis Date  . CAD (coronary artery disease)   . History of transient ischemic attack (TIA)   . Hyperlipidemia   . Peripheral neuropathy   . PVD (peripheral vascular disease) with claudication   . Carotid bruit     bilateral  . Preoperative examination   . Bronchitis, acute   . Ventral hernia   . Personal history of prostate cancer   . Diverticulosis   . ALLERGIC RHINITIS   . Osteoarthritis   . Low back pain   . COPD (chronic obstructive pulmonary disease)   . Tobacco use disorder, continuous     Assessment: Sub-therapeutic heparin level, no issues per RN.  Goal of Therapy:  Heparin level 0.2-0.5 units/ml Monitor platelets by anticoagulation protocol: Yes   Plan:  -Increase heparin infusion to 1000 units/hr -0700  HL -Daily CBC/HL -Trend Hgb given gastric occult positive   Narda Bonds 05/01/2014,11:18 PM

## 2014-05-01 NOTE — Progress Notes (Signed)
Patient had drop in BP, decreased O2 saturation, and temperature up to 102.2 orally within the last hour. Levophed was restarted. Critical Care doctor was notified and new orders were received.

## 2014-05-01 NOTE — Progress Notes (Signed)
Pt's wedding rings given to Daughter in law.

## 2014-05-01 NOTE — Progress Notes (Signed)
ANTICOAGULATION CONSULT NOTE   Pharmacy Consult for Heparin  Indication: chest pain/ACS, s/p cath, IABP in place  Patient Measurements: Height: 5\' 11"  (180.3 cm) Weight: 180 lb 5.4 oz (81.8 kg) IBW/kg (Calculated) : 75.3  Vital Signs: Temp: 100.7 F (38.2 C) (01/23 0400) Temp Source: Oral (01/23 0400) BP: 108/44 mmHg (01/23 0400) Pulse Rate: 148 (01/23 0500)  Labs:  Recent Labs  04/29/14 2130 04/29/14 2134 04/30/14 0255 04/30/14 0845 04/30/14 1415 04/30/14 2011 05/01/14 0430  HGB 13.9 14.3 14.0  --   --   --  11.3*  HCT 39.1 42.0 39.2  --   --   --  32.5*  PLT 213  --  267  --   --   --  125*  APTT 31  --   --   --   --   --   --   LABPROT 13.4  --   --   --   --   --   --   INR 1.01  --   --   --   --   --   --   HEPARINUNFRC  --   --   --  0.61  --  0.13* <0.10*  CREATININE 1.15 1.10 1.13  --   --   --  1.08  TROPONINI  --   --  9.65* 59.45* >80.00*  --   --    Estimated Creatinine Clearance: 62.9 mL/min (by C-G formula based on Cr of 1.08).  Medical History: Past Medical History  Diagnosis Date  . CAD (coronary artery disease)   . History of transient ischemic attack (TIA)   . Hyperlipidemia   . Peripheral neuropathy   . PVD (peripheral vascular disease) with claudication   . Carotid bruit     bilateral  . Preoperative examination   . Bronchitis, acute   . Ventral hernia   . Personal history of prostate cancer   . Diverticulosis   . ALLERGIC RHINITIS   . Osteoarthritis   . Low back pain   . COPD (chronic obstructive pulmonary disease)   . Tobacco use disorder, continuous     Assessment: Sub-therapeutic heparin level, no issues per RN.  Goal of Therapy:  Heparin level 0.2-0.5 units/ml Monitor platelets by anticoagulation protocol: Yes   Plan:  -Increase heparin infusion to 850 units/hr -1300 HL -Daily CBC/HL -Monitor for  bleeding  Narda Bonds 05/01/2014,5:20 AM

## 2014-05-01 NOTE — Progress Notes (Addendum)
Pt temp 102.  Dr. Lamonte Sakai notified.  Will continue to monitor and treat if temperature continues to rise.  Blood and sputem cultures were drawn last night. Output from OG tube looks like coffee grounds.  Will gastrocult as ordered.  Will continue to monitor.

## 2014-05-02 ENCOUNTER — Inpatient Hospital Stay (HOSPITAL_COMMUNITY): Payer: Medicare Other

## 2014-05-02 DIAGNOSIS — J9600 Acute respiratory failure, unspecified whether with hypoxia or hypercapnia: Secondary | ICD-10-CM | POA: Diagnosis present

## 2014-05-02 LAB — GLUCOSE, CAPILLARY
GLUCOSE-CAPILLARY: 192 mg/dL — AB (ref 70–99)
GLUCOSE-CAPILLARY: 199 mg/dL — AB (ref 70–99)
Glucose-Capillary: 114 mg/dL — ABNORMAL HIGH (ref 70–99)
Glucose-Capillary: 118 mg/dL — ABNORMAL HIGH (ref 70–99)
Glucose-Capillary: 122 mg/dL — ABNORMAL HIGH (ref 70–99)
Glucose-Capillary: 126 mg/dL — ABNORMAL HIGH (ref 70–99)
Glucose-Capillary: 131 mg/dL — ABNORMAL HIGH (ref 70–99)
Glucose-Capillary: 131 mg/dL — ABNORMAL HIGH (ref 70–99)
Glucose-Capillary: 140 mg/dL — ABNORMAL HIGH (ref 70–99)

## 2014-05-02 LAB — CBC
HCT: 31.6 % — ABNORMAL LOW (ref 39.0–52.0)
Hemoglobin: 11.1 g/dL — ABNORMAL LOW (ref 13.0–17.0)
MCH: 33.4 pg (ref 26.0–34.0)
MCHC: 35.1 g/dL (ref 30.0–36.0)
MCV: 95.2 fL (ref 78.0–100.0)
Platelets: 107 10*3/uL — ABNORMAL LOW (ref 150–400)
RBC: 3.32 MIL/uL — AB (ref 4.22–5.81)
RDW: 12.9 % (ref 11.5–15.5)
WBC: 9.3 10*3/uL (ref 4.0–10.5)

## 2014-05-02 LAB — BASIC METABOLIC PANEL
Anion gap: 7 (ref 5–15)
BUN: 11 mg/dL (ref 6–23)
CALCIUM: 7.6 mg/dL — AB (ref 8.4–10.5)
CHLORIDE: 102 mmol/L (ref 96–112)
CO2: 25 mmol/L (ref 19–32)
Creatinine, Ser: 1.13 mg/dL (ref 0.50–1.35)
GFR calc Af Amer: 71 mL/min — ABNORMAL LOW (ref >90)
GFR calc non Af Amer: 62 mL/min — ABNORMAL LOW (ref >90)
Glucose, Bld: 134 mg/dL — ABNORMAL HIGH (ref 70–99)
Potassium: 3.6 mmol/L (ref 3.5–5.1)
Sodium: 134 mmol/L — ABNORMAL LOW (ref 135–145)

## 2014-05-02 LAB — PROCALCITONIN: PROCALCITONIN: 0.68 ng/mL

## 2014-05-02 LAB — HEPARIN LEVEL (UNFRACTIONATED)
HEPARIN UNFRACTIONATED: 0.15 [IU]/mL — AB (ref 0.30–0.70)
Heparin Unfractionated: 0.11 IU/mL — ABNORMAL LOW (ref 0.30–0.70)

## 2014-05-02 NOTE — Progress Notes (Signed)
Patient ID: [name], male   DOB: [birth year], 74 y.o.   MRN: [id number]   Subjective:   74 y/o male with previous CABG admitted 1/21 with inferior posterior STEMI c/b CHB and VT arrest. Cath with left dominant circulation. LIMA atretic. SVG -> OM occluded. TVP and IABP placed for shock. TCTS felt not candidate for re-do CABG. Attempted PCI of LM and LCX unable to get wire down LAD due to angle and calcification (LAD open but diseased). POBA LM 90% -> 80%  Lcx 95%->50%.   Intubated on antibiotics for sputum and fever  IABP with good wave forms  Back on levophed for BP support  HR improved    Intake/Output Summary (Last 24 hours) at 05/02/14 0755 Last data filed at 05/02/14 0700  Gross per 24 hour  Intake 3347.98 ml  Output   2100 ml  Net 1247.98 ml    Current meds: . antiseptic oral rinse  7 mL Mouth Rinse QID  . aspirin  81 mg Per Tube Daily  . atorvastatin  80 mg Per Tube q1800  . chlorhexidine  15 mL Mouth Rinse BID  . fentaNYL  50 mcg Intravenous Once  . insulin aspart  0-9 Units Subcutaneous 6 times per day  . ipratropium-albuterol  3 mL Nebulization Q6H  . pantoprazole sodium  40 mg Per Tube Daily  . piperacillin-tazobactam (ZOSYN)  IV  3.375 g Intravenous 3 times per day  . vancomycin  750 mg Intravenous Q12H   Infusions: . sodium chloride 50 mL/hr at 05/01/14 2100  . sodium chloride 10 mL/hr at 05/01/14 2100  . amiodarone 30 mg/hr (05/02/14 0023)  . fentaNYL infusion INTRAVENOUS 150 mcg/hr (05/02/14 0023)  . heparin 1,000 Units/hr (05/02/14 0723)  . midazolam (VERSED) infusion 2 mg/hr (05/02/14 0023)  . norepinephrine (LEVOPHED) Adult infusion 5 mcg/min (05/02/14 0737)     Objective:  Blood pressure 113/37, pulse 59, temperature 100.5 F (38.1 C), temperature source Oral, resp. rate 16, height 5\' 11"  (1.803 m), weight 80.5 kg (177 lb 7.5 oz), SpO2 99 %. Weight change: -1.3 kg (-2 lb 13.9 oz)  Physical Exam: General:  Intubated sedated HEENT: ETT and  NGT Neck: supple. JVP 6-7 . Carotids 2+ bilat; no bruits. No lymphadenopathy or thryomegaly appreciated. Cor: PMI nondisplaced. Distant Regular rate & rhythm. No rubs, gallops or murmurs. Lungs: clear Abdomen: soft, nontender, nondistended. No hepatosplenomegaly. No bruits or masses. Good bowel sounds. Extremities: no cyanosis, clubbing, rash, edema  RFV TVP  Neuro: intubated sedated  Telemetry: NSR 65-70 with PACs/PVCs and brief runs NSVT  Lab Results: Basic Metabolic Panel:  Recent Labs Lab 04/29/14 2130 04/29/14 2134 04/30/14 0255 04/30/14 0845 05/01/14 0430 05/02/14 0425  NA 138 140 132*  --  137 134*  K 3.9 3.9 3.9  --  4.1 3.6  CL 104 101 101  --  107 102  CO2 26  --  22  --  25 25  GLUCOSE 142* 137* 257*  --  137* 134*  BUN 15 19 15   --  11 11  CREATININE 1.15 1.10 1.13  --  1.08 1.13  CALCIUM 8.7  --  7.6*  --  7.5* 7.6*  MG  --   --   --  1.8  --   --   PHOS  --   --   --  3.1  --   --    Liver Function Tests: No results for input(s): AST, ALT, ALKPHOS, BILITOT, PROT, ALBUMIN in the last 168  hours. No results for input(s): LIPASE, AMYLASE in the last 168 hours. No results for input(s): AMMONIA in the last 168 hours. CBC:  Recent Labs Lab 04/29/14 2130 04/29/14 2134 04/30/14 0255 05/01/14 0430 05/02/14 0425  WBC 8.2  --  20.3* 8.5 9.3  NEUTROABS 5.0  --   --   --   --   HGB 13.9 14.3 14.0 11.3* 11.1*  HCT 39.1 42.0 39.2 32.5* 31.6*  MCV 92.9  --  92.9 96.7 95.2  PLT 213  --  267 125* 107*   Cardiac Enzymes:  Recent Labs Lab 04/30/14 0255 04/30/14 0845 04/30/14 1415  TROPONINI 9.65* 59.45* >80.00*   BNP: Invalid input(s): POCBNP CBG:  Recent Labs Lab 05/01/14 0438 05/01/14 1136 05/01/14 2000 05/02/14 0019 05/02/14 0413  GLUCAP 129* 131* 151* 114* 118*   Microbiology: Lab Results  Component Value Date   CULT PENDING 05/01/2014    Recent Labs Lab 05/01/14 0109  CULT PENDING  SDES TRACHEAL ASPIRATE    Imaging: Dg Chest Port 1  View  05/02/2014   CLINICAL DATA:  Acute respiratory failure  EXAM: PORTABLE CHEST - 1 VIEW  COMPARISON:  05/01/2014  FINDINGS: Cardiac shadow is stable. An endotracheal tube, nasogastric catheter and right jugular line are again seen and stable in appearance. Postsurgical changes are again noted. Small pleural effusions are seen bilaterally. Stable retrocardiac atelectasis is noted.  IMPRESSION: Stable left retrocardiac atelectasis. Small pleural effusions bilaterally.  Tubes and lines as described.   Electronically Signed   By: Inez Catalina M.D.   On: 05/02/2014 07:14   Portable Chest Xray  05/01/2014   CLINICAL DATA:  Intubation  EXAM: PORTABLE CHEST - 1 VIEW  COMPARISON:  04/30/2014  FINDINGS: Endotracheal tube tip is just below the clavicular heads. The gastric suction tube at least reaches the diaphragm. Right IJ catheter is in stable position. The aortic balloon pump is at the level of the aortic arch, inflated at time of imaging.  Heart size remains normal. There is a persistent retrocardiac opacity, likely atelectasis and pleural fluid given the myocardial infarction presentation. No pulmonary edema. No pneumothorax.  IMPRESSION: 1. Unchanged positioning of tubes and lines. 2. Stable retrocardiac atelectasis and small effusion.   Electronically Signed   By: Jorje Guild M.D.   On: 05/01/2014 06:41   Dg Chest Port 1 View  04/30/2014   CLINICAL DATA:  Central line placement.  EXAM: PORTABLE CHEST - 1 VIEW  COMPARISON:  None.  FINDINGS: Endotracheal tube, NG tube, right IJ line, mediastinal drainage catheter in stable position. Balloon pump noted with tip at upper portion of descending thoracic aorta. Prior CABG. Left lower lobe infiltrate. Small left pleural effusion. No acute bony abnormality.  IMPRESSION: 1. Interim placement of right IJ line. Tip in good anatomic position. Remaining lines and tubes in stable position. Balloon pump tip at the upper portion of the descending thoracic aorta. 2. Mild  left lower lobe infiltrate and small left pleural effusion.   Electronically Signed   By: Marcello Moores  Register   On: 04/30/2014 16:47     ASSESSMENT: 1. Acute inferior STEMI s/p POBA left main and LCX on 1/22 (unable to pass wire into LAD)      --left dominant system.       --LM 90% -> 80%      --Lcx 80%->50% 2. CAD s/p CABG      --LIMA atretic     --SVG to OM chronically occluded     --RCA non-dominant 3.  Cariogenic shock 4. VT arrest 5. CHB requiring TVP 6. Acute respiratory failure  7. PAD  9. Hypnatremia  PLAN/DISCUSSION:  He is stable  Renal function stable Will continue IABP. Wean Norepi for map 26  But if septic may not be able to do this.  TPM is out and rhythm is stable Rhythm is NSR clearly now with lower HR   Continue ASA/heparin/statin/amio/IABP. Check echo. Once extubated and more stable will need to see if he is a redo CABG candidate  CCM managing vent and antibiotics  Prognosis still guarded   Discussed with CCM  Wean levophed  IABP 1:2 tonight  D/C heparin in am and get right femoral sheath and IABP out      LOS: 3 days  Jenkins Rouge, MD 05/02/2014, 7:55 AM

## 2014-05-02 NOTE — Progress Notes (Signed)
PULMONARY / CRITICAL CARE MEDICINE   Name: [name] MRN: [id number] DOB: 04-05-1941    ADMISSION DATE:  04/29/2014 CONSULTATION DATE:  05/02/2014  REFERRING MD :  [place]  CHIEF COMPLAINT:  STEMI  INITIAL PRESENTATION:  74 y.o. M brought to [facility] ED 1/22 with chest pain, SOB, N/V, diaphoresis.  He was found to have bradycardia and acute inferior STEMI and was subsequently taken to the cath lab for emergent PCI.  In cath lab, had worsening cardiogenic shock and developed CHB requiring temp pacer wire as well as IABP.  He proceeded to have multiple episodes of VT / VF requiring defib x 13.  He was intubated and started on Levophed, Amiodarone, Lidocaine.  PCCM was consulted for vent management.   STUDIES:  EKG 1/22 >>> acute inferoposterior STEMI.  SIGNIFICANT EVENTS: 1/22 - taken emergently to cath lab, unable to stent LAD due to significant calcification, LCx and distal left main balloon angioplastied, intubated   HISTORY OF PRESENT ILLNESS:  Pt is encephalopathic; therefore, this HPI is obtained from chart review. [name] is a 74 y.o. M with PMH as outlined below which includes significant cardiac hx including CAD s/p CABG 1993, PVD s/p PCI to L and R SFA.  He presented to [facility] ED evening of 1/22 with roughly 3 hours of left sided chest pain, pressure like and associated with SOB, N/V and diaphoresis.  He was found to have bradycardia and acute inferior STEMI and was subsequently taken to cath lab for emergent PCI. While in cath lab, he developed progressive cardiogenic shock and complete heart block.  A temporary pacing wire was placed and following this, pt unfortunately had multiple episodes of VT / VF requiring defibrillation x 13.  He was started on Levophed gtt, Amiodarone gtt and Lidocaine gtt and was subsequently intubated for respiratory insufficiency. In cath lab, multiple attempts made to pass wire down LAD without success due to significant calcification.  Did  have successful balloon angioplasty of LCx and distal left main.  SUBJECTIVE:  In NSR, IABP at 1:1 No new issues o/n  VITAL SIGNS: Temp:  [99.4 F (37.4 C)-102 F (38.9 C)] 100.5 F (38.1 C) (01/24 0400) Pulse Rate:  [57-87] 59 (01/24 0700) Resp:  [14-96] 16 (01/24 0700) BP: (104-143)/(34-67) 113/37 mmHg (01/24 0400) SpO2:  [90 %-100 %] 99 % (01/24 0700) Arterial Line BP: (95-157)/(30-62) 105/48 mmHg (01/24 0700) FiO2 (%):  [40 %] 40 % (01/24 0400) Weight:  [80.5 kg (177 lb 7.5 oz)] 80.5 kg (177 lb 7.5 oz) (01/24 0320) HEMODYNAMICS: CVP:  [6 mmHg-11 mmHg] 6 mmHg VENTILATOR SETTINGS: Vent Mode:  [-] PRVC FiO2 (%):  [40 %] 40 % Set Rate:  [16 bmp] 16 bmp Vt Set:  [600 mL] 600 mL PEEP:  [5 cmH20] 5 cmH20 Plateau Pressure:  [12 cmH20-18 cmH20] 18 cmH20 INTAKE / OUTPUT: Intake/Output      01/23 0701 - 01/24 0700 01/24 0701 - 01/25 0700   I.V. (mL/kg) 2848 (35.4)    NG/GT 50    IV Piggyback 500    Total Intake(mL/kg) 3398 (42.2)    Urine (mL/kg/hr) 850 (0.4)    Emesis/NG output 1650 (0.9)    Total Output 2500     Net +898            PHYSICAL EXAMINATION: General: Chronically ill appearing elderly male, on pressors and vent support. Neuro: Sedated on vent. Opens eyes and nods to questions 1/24 HEENT: [place]/AT. PERRL, sclerae anicteric.  ETT in place.  Cardiovascular: IABP sounds Lungs: Respirations even and unlabored.  CTA bilaterally, No wheeze Abdomen: BS x 4, soft, NT/ND.  Musculoskeletal: No gross deformities, no edema.  Skin: Intact, warm, no rashes.  LABS:  CBC  Recent Labs Lab 04/30/14 0255 05/01/14 0430 05/02/14 0425  WBC 20.3* 8.5 9.3  HGB 14.0 11.3* 11.1*  HCT 39.2 32.5* 31.6*  PLT 267 125* 107*   Coag's  Recent Labs Lab 04/29/14 2130  APTT 31  INR 1.01   BMET  Recent Labs Lab 04/30/14 0255 05/01/14 0430 05/02/14 0425  NA 132* 137 134*  K 3.9 4.1 3.6  CL 101 107 102  CO2 22 25 25   BUN 15 11 11   CREATININE 1.13 1.08 1.13  GLUCOSE  257* 137* 134*   Electrolytes  Recent Labs Lab 04/30/14 0255 04/30/14 0845 05/01/14 0430 05/02/14 0425  CALCIUM 7.6*  --  7.5* 7.6*  MG  --  1.8  --   --   PHOS  --  3.1  --   --    Sepsis Markers  Recent Labs Lab 04/30/14 0815 04/30/14 1415 05/01/14 0043 05/01/14 0044 05/02/14 0425  LATICACIDVEN 1.5 0.9  --  1.1  --   PROCALCITON  --   --  1.17  --  0.68   ABG  Recent Labs Lab 04/30/14 0230  PHART 7.283*  PCO2ART 44.2  PO2ART 178.0*   Liver Enzymes No results for input(s): AST, ALT, ALKPHOS, BILITOT, ALBUMIN in the last 168 hours. Cardiac Enzymes  Recent Labs Lab 04/30/14 0255 04/30/14 0845 04/30/14 1415  TROPONINI 9.65* 59.45* >80.00*   Glucose  Recent Labs Lab 04/30/14 2322 05/01/14 0438 05/01/14 1136 05/01/14 2000 05/02/14 0019 05/02/14 0413  GLUCAP 108* 129* 131* 151* 114* 118*    Imaging Dg Chest Port 1 View  05/02/2014   CLINICAL DATA:  Acute respiratory failure  EXAM: PORTABLE CHEST - 1 VIEW  COMPARISON:  05/01/2014  FINDINGS: Cardiac shadow is stable. An endotracheal tube, nasogastric catheter and right jugular line are again seen and stable in appearance. Postsurgical changes are again noted. Small pleural effusions are seen bilaterally. Stable retrocardiac atelectasis is noted.  IMPRESSION: Stable left retrocardiac atelectasis. Small pleural effusions bilaterally.  Tubes and lines as described.   Electronically Signed   By: Inez Catalina M.D.   On: 05/02/2014 07:14   Portable Chest Xray  05/01/2014   CLINICAL DATA:  Intubation  EXAM: PORTABLE CHEST - 1 VIEW  COMPARISON:  04/30/2014  FINDINGS: Endotracheal tube tip is just below the clavicular heads. The gastric suction tube at least reaches the diaphragm. Right IJ catheter is in stable position. The aortic balloon pump is at the level of the aortic arch, inflated at time of imaging.  Heart size remains normal. There is a persistent retrocardiac opacity, likely atelectasis and pleural fluid  given the myocardial infarction presentation. No pulmonary edema. No pneumothorax.  IMPRESSION: 1. Unchanged positioning of tubes and lines. 2. Stable retrocardiac atelectasis and small effusion.   Electronically Signed   By: Jorje Guild M.D.   On: 05/01/2014 06:41   Dg Chest Port 1 View  04/30/2014   CLINICAL DATA:  Central line placement.  EXAM: PORTABLE CHEST - 1 VIEW  COMPARISON:  None.  FINDINGS: Endotracheal tube, NG tube, right IJ line, mediastinal drainage catheter in stable position. Balloon pump noted with tip at upper portion of descending thoracic aorta. Prior CABG. Left lower lobe infiltrate. Small left pleural effusion. No acute bony abnormality.  IMPRESSION: 1. Interim placement  of right IJ line. Tip in good anatomic position. Remaining lines and tubes in stable position. Balloon pump tip at the upper portion of the descending thoracic aorta. 2. Mild left lower lobe infiltrate and small left pleural effusion.   Electronically Signed   By: Marcello Moores  Register   On: 04/30/2014 16:47     EKG:  Acute inferior STEMI   ASSESSMENT / PLAN:  CARDIOVASCULAR A:  Acute inferior STEMI - s/p emergent PCI with balloon angioplasty to LCx and left main 1/22 Complete heart block - s/p temp wire placement 1/22 Cardiogenic Shock - s/p IABP placement 1/22, currently 1:1 Hx HLD, PVD P:  Cardiology following, reviewed case with Dr Philbert Riser IABP augmentation beginning 1/24 pm CVTS consulted, not clear that he will be a redo-CABG candidate but will follow his progress, may be an option if he stabilizes Continue heparin gtt, Amiodarone gtt Wean Levophed, currently at 52, goal to off   PULMONARY OETT 1/22 >>> A: Acute respiratory failure COPD without evidence of exacerbation Tobacco use disorder P:   Full mechanical support, OK for PSV but no plans for extubation until IABP removed, hopefully 1/25 VAP bundle. DuoNebs / Albuterol. Follow ABG and CXR Tobacco cessation.  RENAL A:   No  acute issues P:   NS @ 50 BMP in AM.  GASTROINTESTINAL A:   GI prophylaxis Nutrition Hx Diverticulosis P:   SUP: Pantoprazole. NPO. TF if GI output decreases through the day 1/24  HEMATOLOGIC A:   VTE Prophylaxis P:  SCD's / Heparin gtt. CBC in AM.  INFECTIOUS A:   Fever with purulent secretions, possible PNA P:   vanco 1/23 >>  Zosyn 1/23 >>   Follow cx data and CXR  ENDOCRINE CBG (last 3)  A:   Hyperglycemia P:   SSI if glucose consistently > 180.  NEUROLOGIC A:   Acute metabolic encephalopathy Hx TIA, peripheral neuropathy P:   Sedation:  Fentanyl gtt / Versed gtt. RASS goal: 0 to -1. Daily WUA. Hold outpatient lorazepam.  Family updated: None.  Interdisciplinary Family Meeting v Palliative Care Meeting:  Due by: 1/29.  Independent critical care time is 35 minutes.   Baltazar Apo, MD, PhD 05/02/2014, 7:55 AM [facility] [phone] or if no answer [phone]

## 2014-05-02 NOTE — Progress Notes (Signed)
Rhythm changed assessed on monitored from NSR to Afib. EKG done which showed NSR with a sinus arrhythmia. Several minutes later, monitor showed Afib again with HR 80-100. Pt is on amio and heparin gtt. Will continue to monitor and assess pt's condition.

## 2014-05-02 NOTE — Progress Notes (Addendum)
ANTICOAGULATION CONSULT NOTE - Follow Up Consult  Pharmacy Consult for Heparin Indication: ACS, s/p cath and IABP in place  Allergies  Allergen Reactions  . Codeine Sulfate Itching    Patient Measurements: Height: 5\' 11"  (180.3 cm) Weight: 177 lb 7.5 oz (80.5 kg) IBW/kg (Calculated) : 75.3  Vital Signs: Temp: 99.6 F (37.6 C) (01/24 0800) Temp Source: Oral (01/24 0800) BP: 144/78 mmHg (01/24 0800) Pulse Rate: 66 (01/24 0900)  Labs:  Recent Labs  04/29/14 2130  04/30/14 0255 04/30/14 0845 04/30/14 1415  05/01/14 0430 05/01/14 1310 05/01/14 2245 05/02/14 0425 05/02/14 0625  HGB 13.9  < > 14.0  --   --   --  11.3*  --   --  11.1*  --   HCT 39.1  < > 39.2  --   --   --  32.5*  --   --  31.6*  --   PLT 213  --  267  --   --   --  125*  --   --  107*  --   APTT 31  --   --   --   --   --   --   --   --   --   --   LABPROT 13.4  --   --   --   --   --   --   --   --   --   --   INR 1.01  --   --   --   --   --   --   --   --   --   --   HEPARINUNFRC  --   --   --  0.61  --   < > <0.10* <0.10* 0.13*  --  0.11*  CREATININE 1.15  < > 1.13  --   --   --  1.08  --   --  1.13  --   TROPONINI  --   --  9.65* 59.45* >80.00*  --   --   --   --   --   --   < > = values in this interval not displayed.  Estimated Creatinine Clearance: 60.2 mL/min (by C-G formula based on Cr of 1.13).   Medications:  Scheduled:  . antiseptic oral rinse  7 mL Mouth Rinse QID  . aspirin  81 mg Per Tube Daily  . atorvastatin  80 mg Per Tube q1800  . chlorhexidine  15 mL Mouth Rinse BID  . fentaNYL  50 mcg Intravenous Once  . insulin aspart  0-9 Units Subcutaneous 6 times per day  . ipratropium-albuterol  3 mL Nebulization Q6H  . pantoprazole sodium  40 mg Per Tube Daily  . piperacillin-tazobactam (ZOSYN)  IV  3.375 g Intravenous 3 times per day  . vancomycin  750 mg Intravenous Q12H   Infusions:  . sodium chloride 50 mL/hr at 05/02/14 0800  . sodium chloride 10 mL/hr at 05/01/14 2100  .  amiodarone 30 mg/hr (05/02/14 0800)  . fentaNYL infusion INTRAVENOUS 75 mcg/hr (05/02/14 0904)  . heparin 1,150 Units/hr (05/02/14 0900)  . midazolam (VERSED) infusion 1 mg/hr (05/02/14 0905)  . norepinephrine (LEVOPHED) Adult infusion 5 mcg/min (05/02/14 0800)    Assessment: 74 yo [name] admitted with STEMI and taken emergently to cath where he developed CHB and VT arrest.  IABP and temp pacer wire were placed and patient is on heparin.  AM HL was 0.11 on 1000 units/hr and continues to  be subtherapeutic on current dosing. Patient found to have positive gastric occult yesterday.  Hgb stable at 11.1, plts 107, no trouble infusing or anymore signs of bleeding per RN.  Goal of Therapy:  Heparin level 0.2-0.5 units/ml Monitor platelets by anticoagulation protocol: Yes   Plan:  Increase heparin infusion to 1150 units/hr 8-hr HL @ 1700 Monitor closely for bleeding and trend hgb/plts  Cassie L. Nicole Kindred, PharmD Clinical Pharmacy Resident Pager: [phone] 05/02/2014 9:12 AM  Follow up heparin level is still low at 0.15. No IV issues or bleeding issues noted.  Plan: Increase heparin infusion rate to 1350 units/hr. Recheck heparin level in 8 hours  Erin Hearing PharmD., BCPS Clinical Pharmacist Pager [phone] 05/02/2014 5:59 PM

## 2014-05-03 ENCOUNTER — Inpatient Hospital Stay (HOSPITAL_COMMUNITY): Payer: Medicare Other

## 2014-05-03 DIAGNOSIS — J9601 Acute respiratory failure with hypoxia: Secondary | ICD-10-CM

## 2014-05-03 DIAGNOSIS — I2510 Atherosclerotic heart disease of native coronary artery without angina pectoris: Secondary | ICD-10-CM

## 2014-05-03 LAB — CULTURE, RESPIRATORY W GRAM STAIN: Special Requests: NORMAL

## 2014-05-03 LAB — GLUCOSE, CAPILLARY
GLUCOSE-CAPILLARY: 107 mg/dL — AB (ref 70–99)
GLUCOSE-CAPILLARY: 123 mg/dL — AB (ref 70–99)
GLUCOSE-CAPILLARY: 138 mg/dL — AB (ref 70–99)
GLUCOSE-CAPILLARY: 85 mg/dL (ref 70–99)
Glucose-Capillary: 101 mg/dL — ABNORMAL HIGH (ref 70–99)
Glucose-Capillary: 109 mg/dL — ABNORMAL HIGH (ref 70–99)
Glucose-Capillary: 98 mg/dL (ref 70–99)

## 2014-05-03 LAB — POCT I-STAT 3, ART BLOOD GAS (G3+)
Acid-base deficit: 3 mmol/L — ABNORMAL HIGH (ref 0.0–2.0)
BICARBONATE: 21.3 meq/L (ref 20.0–24.0)
O2 Saturation: 98 %
PH ART: 7.418 (ref 7.350–7.450)
PO2 ART: 95 mmHg (ref 80.0–100.0)
Patient temperature: 98.6
TCO2: 22 mmol/L (ref 0–100)
pCO2 arterial: 33 mmHg — ABNORMAL LOW (ref 35.0–45.0)

## 2014-05-03 LAB — CBC
HCT: 28.7 % — ABNORMAL LOW (ref 39.0–52.0)
Hemoglobin: 10.2 g/dL — ABNORMAL LOW (ref 13.0–17.0)
MCH: 33.3 pg (ref 26.0–34.0)
MCHC: 35.5 g/dL (ref 30.0–36.0)
MCV: 93.8 fL (ref 78.0–100.0)
Platelets: 95 10*3/uL — ABNORMAL LOW (ref 150–400)
RBC: 3.06 MIL/uL — ABNORMAL LOW (ref 4.22–5.81)
RDW: 12.7 % (ref 11.5–15.5)
WBC: 6.7 10*3/uL (ref 4.0–10.5)

## 2014-05-03 LAB — BASIC METABOLIC PANEL
Anion gap: 8 (ref 5–15)
BUN: 10 mg/dL (ref 6–23)
CHLORIDE: 101 mmol/L (ref 96–112)
CO2: 25 mmol/L (ref 19–32)
CREATININE: 0.95 mg/dL (ref 0.50–1.35)
Calcium: 7.6 mg/dL — ABNORMAL LOW (ref 8.4–10.5)
GFR calc non Af Amer: 79 mL/min — ABNORMAL LOW (ref >90)
GLUCOSE: 109 mg/dL — AB (ref 70–99)
Potassium: 3.2 mmol/L — ABNORMAL LOW (ref 3.5–5.1)
Sodium: 134 mmol/L — ABNORMAL LOW (ref 135–145)

## 2014-05-03 LAB — SAVE SMEAR

## 2014-05-03 LAB — BILIRUBIN, FRACTIONATED(TOT/DIR/INDIR)
BILIRUBIN DIRECT: 0.3 mg/dL (ref 0.0–0.5)
BILIRUBIN TOTAL: 1.1 mg/dL (ref 0.3–1.2)
Indirect Bilirubin: 0.8 mg/dL (ref 0.3–0.9)

## 2014-05-03 LAB — POCT ACTIVATED CLOTTING TIME: Activated Clotting Time: 140 seconds

## 2014-05-03 LAB — CULTURE, RESPIRATORY

## 2014-05-03 LAB — LACTATE DEHYDROGENASE: LDH: 299 U/L — ABNORMAL HIGH (ref 94–250)

## 2014-05-03 LAB — PROCALCITONIN: Procalcitonin: 0.46 ng/mL

## 2014-05-03 LAB — HEPARIN LEVEL (UNFRACTIONATED): HEPARIN UNFRACTIONATED: 0.19 [IU]/mL — AB (ref 0.30–0.70)

## 2014-05-03 MED ORDER — POTASSIUM CHLORIDE 10 MEQ/50ML IV SOLN
10.0000 meq | INTRAVENOUS | Status: AC
  Administered 2014-05-03 (×4): 10 meq via INTRAVENOUS
  Filled 2014-05-03 (×4): qty 50

## 2014-05-03 MED ORDER — PRO-STAT SUGAR FREE PO LIQD
30.0000 mL | Freq: Every day | ORAL | Status: DC
Start: 2014-05-04 — End: 2014-05-12
  Administered 2014-05-04 – 2014-05-12 (×7): 30 mL via ORAL
  Filled 2014-05-03 (×9): qty 30

## 2014-05-03 MED ORDER — VITAL AF 1.2 CAL PO LIQD
1000.0000 mL | ORAL | Status: DC
  Administered 2014-05-03 – 2014-05-06 (×5): 1000 mL
  Filled 2014-05-03 (×10): qty 1000

## 2014-05-03 MED ORDER — HEPARIN (PORCINE) IN NACL 100-0.45 UNIT/ML-% IJ SOLN
1850.0000 [IU]/h | INTRAMUSCULAR | Status: AC
  Administered 2014-05-03: 1450 [IU]/h via INTRAVENOUS
  Administered 2014-05-04: 1700 [IU]/h via INTRAVENOUS
  Administered 2014-05-05: 1850 [IU]/h via INTRAVENOUS
  Filled 2014-05-03 (×8): qty 250

## 2014-05-03 MED ORDER — PRO-STAT SUGAR FREE PO LIQD: 30.0000 mL | Freq: Once | ORAL | Status: DC

## 2014-05-03 MED ORDER — POTASSIUM CHLORIDE 20 MEQ/15ML (10%) PO SOLN
40.0000 meq | Freq: Once | ORAL | Status: AC
  Administered 2014-05-03: 40 meq via ORAL
  Filled 2014-05-03: qty 30

## 2014-05-03 MED ORDER — VITAL HIGH PROTEIN PO LIQD
1000.0000 mL | ORAL | Status: DC
  Administered 2014-05-03: 1000 mL
  Filled 2014-05-03 (×3): qty 1000

## 2014-05-03 MED ORDER — PRO-STAT SUGAR FREE PO LIQD
30.0000 mL | ORAL | Status: AC
  Administered 2014-05-03: 30 mL via ORAL
  Filled 2014-05-03 (×2): qty 30

## 2014-05-03 MED FILL — Sodium Chloride IV Soln 0.9%: INTRAVENOUS | Qty: 50 | Status: AC

## 2014-05-03 NOTE — Progress Notes (Signed)
EKG CRITICAL VALUE     12 lead EKG performed.  Critical value noted.  Joneen Boers, RN notified.   Melissa Montane, CCT 05/03/2014 2:20 PM

## 2014-05-03 NOTE — Progress Notes (Signed)
Order to d/c IABP, Heparin gtt turned off.Cath lab personnel informed. stated to get ACT and call back. ACT results called to Cath lab. Cath lab staff, Gaspar Bidding here to pull IABP. Will cont to monitor closely.

## 2014-05-03 NOTE — Progress Notes (Signed)
PULMONARY / CRITICAL CARE MEDICINE   Name: [name] MRN: [id number] DOB: 11-Jul-1940    ADMISSION DATE:  04/29/2014 CONSULTATION DATE:  05/03/2014  REFERRING MD :  Martin[place]  CHIEF COMPLAINT:  STEMI  INITIAL PRESENTATION:  74 y.o. M brought to [facility] ED 1/22 with chest pain, SOB, N/V, diaphoresis.  He was found to have bradycardia and acute inferior STEMI and was subsequently taken to the cath lab for emergent PCI.  In cath lab, had worsening cardiogenic shock and developed CHB requiring temp pacer wire as well as IABP but did have successful balloon angioplasty of LCx and distal left main.  He proceeded to have multiple episodes of VT / VF requiring defib x 13.  He was intubated and started on Levophed, Amiodarone, Lidocaine.  PCCM was consulted for vent management.  STUDIES:  EKG 1/22 >>> acute inferoposterior STEMI pCXR 1/24: small pleural effusions b/l, stable L retrocardiac atelectasis  Lines: OETT 1/22>>> NGT RIJ  SIGNIFICANT EVENTS: 1/22 - taken emergently to cath lab, unable to stent LAD due to significant calcification, LCx and distal left main balloon angioplastied, intubated 1/24: Cardiogenic shock, remains on pressors  SUBJECTIVE:  In NSR, IABP at 1:2 overnight. Now off pressors  VITAL SIGNS: Temp:  [98.5 F (36.9 C)-100 F (37.8 C)] 98.8 F (37.1 C) (01/25 0400) Pulse Rate:  [43-118] 118 (01/25 0400) Resp:  [16-23] 16 (01/25 0700) BP: (113-150)/(37-103) 150/84 mmHg (01/25 0400) SpO2:  [95 %-100 %] 100 % (01/25 0700) Arterial Line BP: (102-156)/(35-76) 104/40 mmHg (01/25 0700) FiO2 (%):  [40 %] 40 % (01/25 0400) Weight:  [181 lb 14.1 oz (82.5 kg)] 181 lb 14.1 oz (82.5 kg) (01/25 0428) HEMODYNAMICS:   VENTILATOR SETTINGS: Vent Mode:  [-] PRVC FiO2 (%):  [40 %] 40 % Set Rate:  [16 bmp] 16 bmp Vt Set:  [600 mL] 600 mL PEEP:  [5 cmH20] 5 cmH20 Plateau Pressure:  [16 cmH20-20 cmH20] 16 cmH20 INTAKE / OUTPUT: Intake/Output      01/24 0701 - 01/25 0700  01/25 0701 - 01/26 0700   I.V. (mL/kg) 2347.2 (28.5)    NG/GT 120    IV Piggyback 400    Total Intake(mL/kg) 2867.2 (34.8)    Urine (mL/kg/hr) 915 (0.5)    Emesis/NG output 900 (0.5)    Total Output 1815     Net +1052.2            PHYSICAL EXAMINATION: General: lying in bed, on mechanical ventilation Neuro: Sedated on vent. Opens eyes, follows commands HEENT: [place]/AT. PERRL, sclerae anicteric.  ETT in place. Cardiovascular: s1 s2 RRR Lungs: Coarse b/l breath sounds Abdomen: BS present, soft, NT/ND Musculoskeletal: Feet edematous Skin: Warm  LABS:  CBC  Recent Labs Lab 05/01/14 0430 05/02/14 0425 05/03/14 0318  WBC 8.5 9.3 6.7  HGB 11.3* 11.1* 10.2*  HCT 32.5* 31.6* 28.7*  PLT 125* 107* 95*   Coag's  Recent Labs Lab 04/29/14 2130  APTT 31  INR 1.01   BMET  Recent Labs Lab 05/01/14 0430 05/02/14 0425 05/03/14 0318  NA 137 134* 134*  K 4.1 3.6 3.2*  CL 107 102 101  CO2 25 25 25   BUN 11 11 10   CREATININE 1.08 1.13 0.95  GLUCOSE 137* 134* 109*   Electrolytes  Recent Labs Lab 04/30/14 0845 05/01/14 0430 05/02/14 0425 05/03/14 0318  CALCIUM  --  7.5* 7.6* 7.6*  MG 1.8  --   --   --   PHOS 3.1  --   --   --  Sepsis Markers  Recent Labs Lab 04/30/14 0815 04/30/14 1415 05/01/14 0043 05/01/14 0044 05/02/14 0425 05/03/14 0318  LATICACIDVEN 1.5 0.9  --  1.1  --   --   PROCALCITON  --   --  1.17  --  0.68 0.46   ABG  Recent Labs Lab 04/30/14 0230  PHART 7.283*  PCO2ART 44.2  PO2ART 178.0*   Cardiac Enzymes  Recent Labs Lab 04/30/14 0255 04/30/14 0845 04/30/14 1415  TROPONINI 9.65* 59.45* >80.00*   Glucose  Recent Labs Lab 05/02/14 1552 05/02/14 1941 05/02/14 1942 05/02/14 2352 05/03/14 0322 05/03/14 0721  GLUCAP 131* 192* 199* 101* 107* 123*   Imaging Dg Chest Port 1 View  05/02/2014   CLINICAL DATA:  Acute respiratory failure  EXAM: PORTABLE CHEST - 1 VIEW  COMPARISON:  05/01/2014  FINDINGS: Cardiac shadow is  stable. An endotracheal tube, nasogastric catheter and right jugular line are again seen and stable in appearance. Postsurgical changes are again noted. Small pleural effusions are seen bilaterally. Stable retrocardiac atelectasis is noted.  IMPRESSION: Stable left retrocardiac atelectasis. Small pleural effusions bilaterally.  Tubes and lines as described.   Electronically Signed   By: Inez Catalina M.D.   On: 05/02/2014 07:14    ASSESSMENT / PLAN:  CARDIOVASCULAR A:  Acute inferior STEMI - s/p emergent PCI with balloon angioplasty to LCx and left main 1/22 Complete heart block - s/p temp wire placement 1/22 Cardiogenic Shock - s/p IABP placement 1/22 Hx HLD, PVD PAF--noted by RN to briefly switch back into Afib this morning, currently NSR P:  Cardiology following Plan to pull IABP this AM.  CVTS to be consulted--?candidate for redo bypass grafting Heparin gtt--held this AM, Amiodarone gtt Now off pressors  PULMONARY OETT 1/22 >>> A: Acute respiratory failure COPD without evidence of exacerbation Tobacco use disorder P:   Full mechanical support, no plans for weaning until IABP removed, hopefully this morning VAP bundle: 600/16/5/40- repeat abg to ensure ph corrected from prior DuoNebs / Albuterol. Follow CXR Tobacco cessation If not extubated will advance ett  RENAL A:   Hypokalemia mild Hyponatremia mild P:   NS @ 50 BMP in AM Replacing mag Check mag and phos Monitor I/O's +4.7L since admission In future likely to need lasix Even  Balance goals  GASTROINTESTINAL A:   GI prophylaxis Nutrition Hx Diverticulosis P:   SUP: Pantoprazole. NPO TF if unable to extubate  HEMATOLOGIC A:   VTE Prophylaxis Anemia--Hb trending down to 10.2 this morning Thrombocytopenia (at risk hemolysis IABP), r/o type 1 HITT, dilutional contribution P:  SCD's / Heparin gtt (currently on hold) CBC in AM Assess ldh, retic count, indir bili, hapto, smear Follow plat in am    INFECTIOUS A:   ?PNA-- tmax 102.19f this admission, leukocytosis resolved. Temp 99.25F this AM Procalcitonin neg, all less 2 LA 1.1 Unimpressed infection P:   vanco 1/23 >> 1/25 Zosyn 1/23 >> 1/25  Blood  cx 1/23: NGTD x2 Sputum cx 1/23>>> -consider narrowing abx or dc and observe  ENDOCRINE CBG (last 3)  A:   Hyperglycemia P:   SSI if glucose consistently > 180  NEUROLOGIC A:   Acute metabolic encephalopathy Hx TIA, peripheral neuropathy P:   Sedation:  Fentanyl gtt / Versed gtt--currently off for WUA RASS goal: 0 to -1. Daily WUA. Hold outpatient lorazepam.  Family updated: None.  Interdisciplinary Family Meeting v Palliative Care Meeting:  Due by: 1/29.  Signed: Jerene Pitch, MD PGY-3, Internal Medicine Resident Pager: [phone]  05/03/2014,8:27  AM  STAFF NOTE: I, Merrie Roof, MD FACP have personally reviewed patient's available data, including medical history, events of note, physical examination and test results as part of my evaluation. I have discussed with resident/NP and other care providers such as pharmacist, RN and RRT. In addition, I personally evaluated patient and elicited key findings of: hope AIBP out, then abg, then wean cpap5 ps 5 goal 30 min, even balance goals, may need lasix, WUA, dc abx and observe, lungs improved but coarse The patient is critically ill with multiple organ systems failure and requires high complexity decision making for assessment and support, frequent evaluation and titration of therapies, application of advanced monitoring technologies and extensive interpretation of multiple databases.   Critical Care Time devoted to patient care services described in this note is30  Minutes. This time reflects time of care of this signee: Merrie Roof, MD FACP. This critical care time does not reflect procedure time, or teaching time or supervisory time of PA/NP/Med student/Med Resident etc but could involve care discussion time.  Rest per NP/medical resident whose note is outlined above and that I agree with   Lavon Paganini. Titus Mould, MD, [place] Pgr: [facility] 05/03/2014 9:14 AM

## 2014-05-03 NOTE — Progress Notes (Signed)
PROGRESS NOTE  Subjective:   [name] is a 74 year old gentleman with a history of coronary artery disease-status post coronary artery bypass grafting. His admitted on January 21 with an inferior posterior ST segment elevation myocardial infarction. Was compensated with complete heart block and ventricular talked tachycardia arrest. Cardiac cath revealed left dominant circulation. He he had an atretic internal mammary artery. The saphenous vein graft to the OM was occluded.  He required temporary venous pacemaker and an intra-aortic balloon pump for cardiogenic shock. At that time he was felt not to be a good candidate for redo cabbage.    Dr. Martinique was able to antiplastic the left main and left circumflex with minimal improvement.  BP has been stable. He is off pressors.  Objective:    Vital Signs:   Temp:  [98.5 F (36.9 C)-100 F (37.8 C)] 98.8 F (37.1 C) (01/25 0400) Pulse Rate:  [43-118] 118 (01/25 0400) Resp:  [16-23] 16 (01/25 0700) BP: (113-150)/(37-103) 150/84 mmHg (01/25 0400) SpO2:  [95 %-100 %] 100 % (01/25 0700) Arterial Line BP: (102-156)/(35-76) 104/40 mmHg (01/25 0700) FiO2 (%):  [40 %] 40 % (01/25 0400) Weight:  [181 lb 14.1 oz (82.5 kg)] 181 lb 14.1 oz (82.5 kg) (01/25 0428)  Last BM Date:  (UTA, none charted)   24-hour weight change: Weight change: 4 lb 6.6 oz (2 kg)  Weight trends: Filed Weights   05/01/14 0442 05/02/14 0320 05/03/14 0428  Weight: 180 lb 5.4 oz (81.8 kg) 177 lb 7.5 oz (80.5 kg) 181 lb 14.1 oz (82.5 kg)    Intake/Output:  01/24 0701 - 01/25 0700 In: 2867.2 [I.V.:2347.2; NG/GT:120; IV Piggyback:400] Out: 1815 [Urine:915; Emesis/NG output:900]     Physical Exam: BP 150/84 mmHg  Pulse 118  Temp(Src) 98.8 F (37.1 C) (Oral)  Resp 16  Ht 5\' 11"  (1.803 m)  Wt 181 lb 14.1 oz (82.5 kg)  BMI 25.38 kg/m2  SpO2 100%  Wt Readings from Last 3 Encounters:  05/03/14 181 lb 14.1 oz (82.5 kg)  04/14/14 172 lb (78.019 kg)  01/11/14 168  lb (76.204 kg)    General: Vital signs reviewed and noted.  BP is ok with the IABP in standby mode   Head: Normocephalic, atraumatic.  Eyes: conjunctivae/corneas clear.  EOM's intact.   Throat: intubated  Neck:  normal   Lungs:    on vent   Heart:  RR   Abdomen:  Soft, non-tender, non-distended    Extremities: No edema.  Slight echymosis on arms    Neurologic:  he's and on sedation. The sedation was recently stopped. He opens his eyes. He did not respond to any commands this morning. The nurse reports that he has been responding fairly normally.   Psych:  unable to assess at this time.    Labs: BMET:  Recent Labs  04/30/14 0845  05/02/14 0425 05/03/14 0318  NA  --   < > 134* 134*  K  --   < > 3.6 3.2*  CL  --   < > 102 101  CO2  --   < > 25 25  GLUCOSE  --   < > 134* 109*  BUN  --   < > 11 10  CREATININE  --   < > 1.13 0.95  CALCIUM  --   < > 7.6* 7.6*  MG 1.8  --   --   --   PHOS 3.1  --   --   --   < > =  values in this interval not displayed.  Liver function tests: No results for input(s): AST, ALT, ALKPHOS, BILITOT, PROT, ALBUMIN in the last 72 hours. No results for input(s): LIPASE, AMYLASE in the last 72 hours.  CBC:  Recent Labs  05/02/14 0425 05/03/14 0318  WBC 9.3 6.7  HGB 11.1* 10.2*  HCT 31.6* 28.7*  MCV 95.2 93.8  PLT 107* 95*    Cardiac Enzymes:  Recent Labs  04/30/14 0845 04/30/14 1415  TROPONINI 59.45* >80.00*    Coagulation Studies: No results for input(s): LABPROT, INR in the last 72 hours.  Other: Invalid input(s): POCBNP No results for input(s): DDIMER in the last 72 hours. No results for input(s): HGBA1C in the last 72 hours. No results for input(s): CHOL, HDL, LDLCALC, TRIG, CHOLHDL in the last 72 hours. No results for input(s): TSH, T4TOTAL, T3FREE, THYROIDAB in the last 72 hours.  Invalid input(s): FREET3 No results for input(s): VITAMINB12, FOLATE, FERRITIN, TIBC, IRON, RETICCTPCT in the last 72 hours.   Other  results:  Tele:  NSR   Medications:    Infusions: . sodium chloride 50 mL/hr (05/02/14 1638)  . sodium chloride 10 mL/hr at 05/01/14 2100  . amiodarone 30 mg/hr (05/02/14 2145)  . fentaNYL infusion INTRAVENOUS 75 mcg/hr (05/03/14 0515)  . heparin 1,450 Units/hr (05/03/14 0500)  . midazolam (VERSED) infusion 1 mg/hr (05/02/14 0905)  . norepinephrine (LEVOPHED) Adult infusion Stopped (05/02/14 2300)    Scheduled Medications: . antiseptic oral rinse  7 mL Mouth Rinse QID  . aspirin  81 mg Per Tube Daily  . atorvastatin  80 mg Per Tube q1800  . chlorhexidine  15 mL Mouth Rinse BID  . fentaNYL  50 mcg Intravenous Once  . insulin aspart  0-9 Units Subcutaneous 6 times per day  . ipratropium-albuterol  3 mL Nebulization Q6H  . pantoprazole sodium  40 mg Per Tube Daily  . piperacillin-tazobactam (ZOSYN)  IV  3.375 g Intravenous 3 times per day  . potassium chloride  10 mEq Intravenous Q1 Hr x 4  . vancomycin  750 mg Intravenous Q12H    Assessment/ Plan:     1.   STEMI (ST elevation myocardial infarction)  the patient was found have severe three-vessel coronary artery disease. Dr. Martinique was able to open the left main slightly and was able to open the circumflex artery slightly. He was unable to wire the left anterior descending artery because of tortuosity and an acute angle.  We will continue with current medical therapy. We'll discuss with thoracic surgeons to see if he may be a candidate for redo bypass grafting as he improves.    2. Cardiogenic shock:  He is currently off pressures. We placed the intra-aortic balloon pump on standby and remained quite stable. We'll anticipate pulling the intra-aortic balloon pump this morning. I've called Cath Lab. The nurse will need to check ACTs  on a scheduled basis and will call the Cath Lab when ACTs  are acceptable.     3. Cardiac arrest:  He seems to be doing better.    4. VT (ventricular tachycardia):  Is currently on amiodarone drip.  Continue the drip now.    5. CHB (complete heart block):  Improved.   6.  hypokalemia:  he was given 40 mEq of potassium chloride this point. We will repeat that again tonight. We will also check a magnesium level.    Disposition:     Will  pull the intra-aortic balloon pump. We'll keep in intensive care  unit today.   Length of Stay: 4  Thayer Headings, Brooke Bonito., MD, [facility] 05/03/2014, 7:58 AM Office [phone] Pager [phone]

## 2014-05-03 NOTE — Progress Notes (Signed)
eLink Physician-Brief Progress Note Patient Name: QUINT CHESTNUT DOB: Sep 07, 1940 MRN: [id number]   Date of Service  05/03/2014  HPI/Events of Note  ETT advanced 2 cm  eICU Interventions  CXR ordered to confirm position     Intervention Category Intermediate Interventions: Other:  BYRUM,ROBERT S. 05/03/2014, 3:35 PM

## 2014-05-03 NOTE — Progress Notes (Signed)
IABP aspirated and removed from LFA, manual pressure applied for 30 minutes, hemostasis achieved. BP 144/69 hr85.  Patient sedated, intubated, unable to receive bedrest instructions. Tegaderm dressing applied, no s+s of hematoma, groin level 0.  Distal pulses : doppler right dp, left dp palpable.

## 2014-05-03 NOTE — Progress Notes (Addendum)
ANTICOAGULATION CONSULT NOTE   Pharmacy Consult for Heparin  Indication: chest pain/ACS, s/p cath   Patient Measurements: Height: 5\' 11"  (180.3 cm) Weight: 181 lb 14.1 oz (82.5 kg) IBW/kg (Calculated) : 75.3  Vital Signs: Temp: 99.2 F (37.3 C) (01/25 1200) Temp Source: Oral (01/25 1200) BP: 114/51 mmHg (01/25 1200) Pulse Rate: 118 (01/25 0400)  Labs:  Recent Labs  04/30/14 1415  05/01/14 0430  05/02/14 0425 05/02/14 0625 05/02/14 1700 05/03/14 0130 05/03/14 0318  HGB  --   < > 11.3*  --  11.1*  --   --   --  10.2*  HCT  --   --  32.5*  --  31.6*  --   --   --  28.7*  PLT  --   --  125*  --  107*  --   --   --  95*  HEPARINUNFRC  --   < > <0.10*  < >  --  0.11* 0.15* 0.19*  --   CREATININE  --   --  1.08  --  1.13  --   --   --  0.95  TROPONINI >80.00*  --   --   --   --   --   --   --   --   < > = values in this interval not displayed. Estimated Creatinine Clearance: 71.6 mL/min (by C-G formula based on Cr of 0.95).   Assessment: 74 yo male with STEMI s/p cath and IABP. The IABP was removed today with plans to d/c heparin. There is concern for recurrent ischemia and Dr. Cathie Olden has consulted pharmacy to restart heparin. The IABP was removed at about 10pm. Heparin to restart 6 hours post sheath/IABP pull. Platelets noted at 95K with trend down (likely in setting of IABP) and patient also noted with positive gastric occult on 1/23.  Goal of Therapy:  Heparin level= 0.3-0.5 Monitor platelets by anticoagulation protocol: Yes   Plan:  -Restart heparin at 1450 units/hr -Heparin level in 8hrs -Heparin level and CBC daily  Hildred Laser, Pharm D 05/03/2014 2:18 PM

## 2014-05-03 NOTE — Progress Notes (Signed)
NUTRITION FOLLOW UP  Intervention:   Initiate Vital AF 1.2 at 20 ml/h, increase by 10 ml every 4 hours to goal rate of 60 ml/h with Prostat 30 ml once daily to provide 1828 kcals, 123 gm protein, 1168 ml free water daily.  Nutrition Dx:   Inadequate oral intake related to inability to eat as evidenced by NPO status.  Ongoing  Goal:   Pt to meet >/= 90% of estimated nutrition needs; not yet met  Monitor:   TF tolerance/adequacy, weight trend, labs, vent status.  Assessment:   Pt admitted with chest pain, SOB, n/v.  Was found to have bradycardia and acute inferior STEMI and was taken to cath lab.  Had worsening cardiogenic shock requiring temp pacer wire and IABP, pt intubated.  Hx of CAD, CABG 1993, PVD, TIA, peripheral neuropathy.  NG tube placed, TF not initally started due to NG output  1/22: 700 1/23:1650 1/24: 900  Pt currently intubated on ventilator support MV: 9.6 L/min Temp: 99.5 (36.9) Propofol: none  Discussed with RN, per RN pt w/ OG tube in placed, suctioned stopped and ready to start feedings RN ready to start TF @ 38m/hr.   CBGs slightly eleveated  Height: Ht Readings from Last 1 Encounters:  04/30/14 _0  (1.803 m)    Weight Status:   Wt Readings from Last 1 Encounters:  05/03/14 181 lb 14.1 oz (82.5 kg)  Admission Weight: 171lbs   Re-estimated needs:  Kcal: 1770 Protein: 110-125 gm protein Fluid: 2 L  Skin: ecchymosis on arms  Diet Order: Diet NPO time specified   Intake/Output Summary (Last 24 hours) at 05/03/14 1538 Last data filed at 05/03/14 1500  Gross per 24 hour  Intake 2618.74 ml  Output   1865 ml  Net 753.74 ml    Last BM: none documented  Labs:   Recent Labs Lab 04/30/14 0845 05/01/14 0430 05/02/14 0425 05/03/14 0318  NA  --  137 134* 134*  K  --  4.1 3.6 3.2*  CL  --  107 102 101  CO2  --  _1 BUN  --  _2 CREATININE  --  1.08 1.13 0.95  CALCIUM  --  7.5* 7.6* 7.6*  MG 1.8  --   --   --   PHOS 3.1   --   --   --   GLUCOSE  --  137* 134* 109*    CBG (last 3)   Recent Labs  05/03/14 0322 05/03/14 0721 05/03/14 1136  GLUCAP 107* 123* 138*    Scheduled Meds: . antiseptic oral rinse  7 mL Mouth Rinse QID  . aspirin  81 mg Per Tube Daily  . atorvastatin  80 mg Per Tube q1800  . chlorhexidine  15 mL Mouth Rinse BID  . feeding supplement (VITAL HIGH PROTEIN)  1,000 mL Per Tube Q24H  . fentaNYL  50 mcg Intravenous Once  . insulin aspart  0-9 Units Subcutaneous 6 times per day  . ipratropium-albuterol  3 mL Nebulization Q6H  . pantoprazole sodium  40 mg Per Tube Daily  . piperacillin-tazobactam (ZOSYN)  IV  3.375 g Intravenous 3 times per day  . vancomycin  750 mg Intravenous Q12H    Continuous Infusions: . sodium chloride 50 mL/hr (05/02/14 1638)  . sodium chloride 10 mL/hr at 05/01/14 2100  . amiodarone 30 mg/hr (05/02/14 2145)  . fentaNYL infusion INTRAVENOUS 50 mcg/hr (05/03/14 1132)  . heparin    . midazolam (VERSED)  infusion 1 mg/hr (05/03/14 1227)  . norepinephrine (LEVOPHED) Adult infusion Stopped (05/02/14 2300)    Elmer Picker MS Dietetic Intern Pager Number [phone]

## 2014-05-03 NOTE — Progress Notes (Signed)
[facility] ADULT ICU REPLACEMENT PROTOCOL FOR AM LAB REPLACEMENT ONLY  The patient does apply for the [facility] Adult ICU Electrolyte Replacment Protocol based on the criteria listed below:   1. Is GFR >/= 40 ml/min? Yes.    Patient's GFR today is >90 2. Is urine output >/= 0.5 ml/kg/hr for the last 6 hours? Yes.   Patient's UOP is .68 ml/kg/hr 3. Is BUN < 60 mg/dL? Yes.    Patient's BUN today is 10 4. Abnormal electrolyte  K 3.2 5. Ordered repletion with: per protocol 6. If a panic level lab has been reported, has the CCM MD in charge been notified? Yes.  .   Physician:  E Deterding  Christeen Douglas 05/03/2014 4:34 AM

## 2014-05-03 NOTE — Progress Notes (Signed)
Pt;s HR dropped 40's with sbp 80's and MAP < 65. Rhythm: ? 2nd or 3rd degree Heart block ? Junctional. Within few seconds, pt out of rhythm and back in SR. Dr Acie Fredrickson notified. Stated he would come by and check on pt. New orders received. Pt's son updated. Will cont to monitor.   1410 Abn. EKG called to Dr. Mare Ferrari, no new orders. will monitor closely.

## 2014-05-03 NOTE — Progress Notes (Signed)
ANTICOAGULATION CONSULT NOTE   Pharmacy Consult for Heparin  Indication: chest pain/ACS, s/p cath, IABP in place  Patient Measurements: Height: 5\' 11"  (180.3 cm) Weight: 177 lb 7.5 oz (80.5 kg) IBW/kg (Calculated) : 75.3  Vital Signs: Temp: 98.5 F (36.9 C) (01/25 0000) Temp Source: Oral (01/25 0000) BP: 127/58 mmHg (01/25 0048) Pulse Rate: 43 (01/25 0100)  Labs:  Recent Labs  04/30/14 0255 04/30/14 0845 04/30/14 1415  05/01/14 0430  05/02/14 0425 05/02/14 0625 05/02/14 1700 05/03/14 0130  HGB 14.0  --   --   --  11.3*  --  11.1*  --   --   --   HCT 39.2  --   --   --  32.5*  --  31.6*  --   --   --   PLT 267  --   --   --  125*  --  107*  --   --   --   HEPARINUNFRC  --  0.61  --   < > <0.10*  < >  --  0.11* 0.15* 0.19*  CREATININE 1.13  --   --   --  1.08  --  1.13  --   --   --   TROPONINI 9.65* 59.45* >80.00*  --   --   --   --   --   --   --   < > = values in this interval not displayed. Estimated Creatinine Clearance: 60.2 mL/min (by C-G formula based on Cr of 1.13).  Medical History: Past Medical History  Diagnosis Date  . CAD (coronary artery disease)   . History of transient ischemic attack (TIA)   . Hyperlipidemia   . Peripheral neuropathy   . PVD (peripheral vascular disease) with claudication   . Carotid bruit     bilateral  . Preoperative examination   . Bronchitis, acute   . Ventral hernia   . Personal history of prostate cancer   . Diverticulosis   . ALLERGIC RHINITIS   . Osteoarthritis   . Low back pain   . COPD (chronic obstructive pulmonary disease)   . Tobacco use disorder, continuous     Assessment: Sub-therapeutic heparin level, no issues per RN.  Goal of Therapy:  Heparin level 0.2-0.5 units/ml Monitor platelets by anticoagulation protocol: Yes   Plan:  -Increase heparin infusion to 1450 units/hr -1000 -Daily CBC/HL -Trend Hgb given gastric occult positive  -Possibly getting IABP out this AM  Narda Bonds 05/03/2014,2:04 AM

## 2014-05-03 NOTE — Progress Notes (Signed)
Advanced ETT 2cm oper MD order.  ETT now 28cm at the lip

## 2014-05-04 ENCOUNTER — Inpatient Hospital Stay (HOSPITAL_COMMUNITY): Payer: Medicare Other

## 2014-05-04 ENCOUNTER — Encounter (HOSPITAL_COMMUNITY): Payer: Self-pay | Admitting: Cardiothoracic Surgery

## 2014-05-04 DIAGNOSIS — I25110 Atherosclerotic heart disease of native coronary artery with unstable angina pectoris: Secondary | ICD-10-CM

## 2014-05-04 DIAGNOSIS — Z951 Presence of aortocoronary bypass graft: Secondary | ICD-10-CM

## 2014-05-04 DIAGNOSIS — E876 Hypokalemia: Secondary | ICD-10-CM

## 2014-05-04 LAB — BASIC METABOLIC PANEL
Anion gap: 8 (ref 5–15)
BUN: 12 mg/dL (ref 6–23)
CO2: 24 mmol/L (ref 19–32)
Calcium: 7.7 mg/dL — ABNORMAL LOW (ref 8.4–10.5)
Chloride: 102 mmol/L (ref 96–112)
Creatinine, Ser: 0.86 mg/dL (ref 0.50–1.35)
GFR calc non Af Amer: 83 mL/min — ABNORMAL LOW (ref >90)
Glucose, Bld: 113 mg/dL — ABNORMAL HIGH (ref 70–99)
POTASSIUM: 3.1 mmol/L — AB (ref 3.5–5.1)
SODIUM: 134 mmol/L — AB (ref 135–145)

## 2014-05-04 LAB — POTASSIUM: Potassium: 3.9 mmol/L (ref 3.5–5.1)

## 2014-05-04 LAB — BLOOD GAS, ARTERIAL
Acid-base deficit: 0.5 mmol/L (ref 0.0–2.0)
BICARBONATE: 23.2 meq/L (ref 20.0–24.0)
FIO2: 0.4 %
O2 Saturation: 92.9 %
PEEP: 5 cmH2O
Patient temperature: 98.6
RATE: 16 resp/min
TCO2: 24.3 mmol/L (ref 0–100)
VT: 600 mL
pCO2 arterial: 35.4 mmHg (ref 35.0–45.0)
pH, Arterial: 7.433 (ref 7.350–7.450)
pO2, Arterial: 63.7 mmHg — ABNORMAL LOW (ref 80.0–100.0)

## 2014-05-04 LAB — PHOSPHORUS: PHOSPHORUS: 2.6 mg/dL (ref 2.3–4.6)

## 2014-05-04 LAB — CBC
HCT: 28.8 % — ABNORMAL LOW (ref 39.0–52.0)
Hemoglobin: 10.3 g/dL — ABNORMAL LOW (ref 13.0–17.0)
MCH: 33.4 pg (ref 26.0–34.0)
MCHC: 35.8 g/dL (ref 30.0–36.0)
MCV: 93.5 fL (ref 78.0–100.0)
Platelets: 108 10*3/uL — ABNORMAL LOW (ref 150–400)
RBC: 3.08 MIL/uL — AB (ref 4.22–5.81)
RDW: 12.8 % (ref 11.5–15.5)
WBC: 6.2 10*3/uL (ref 4.0–10.5)

## 2014-05-04 LAB — POCT ACTIVATED CLOTTING TIME: Activated Clotting Time: 147 seconds

## 2014-05-04 LAB — HEPARIN LEVEL (UNFRACTIONATED)
HEPARIN UNFRACTIONATED: 0.26 [IU]/mL — AB (ref 0.30–0.70)
Heparin Unfractionated: 0.13 IU/mL — ABNORMAL LOW (ref 0.30–0.70)
Heparin Unfractionated: 0.37 IU/mL (ref 0.30–0.70)

## 2014-05-04 LAB — GLUCOSE, CAPILLARY
GLUCOSE-CAPILLARY: 97 mg/dL (ref 70–99)
Glucose-Capillary: 105 mg/dL — ABNORMAL HIGH (ref 70–99)
Glucose-Capillary: 120 mg/dL — ABNORMAL HIGH (ref 70–99)
Glucose-Capillary: 103 mg/dL — ABNORMAL HIGH (ref 70–99)
Glucose-Capillary: 121 mg/dL — ABNORMAL HIGH (ref 70–99)

## 2014-05-04 LAB — MAGNESIUM: Magnesium: 2.1 mg/dL (ref 1.5–2.5)

## 2014-05-04 LAB — HAPTOGLOBIN: Haptoglobin: 239 mg/dL — ABNORMAL HIGH (ref 34–200)

## 2014-05-04 MED ORDER — FUROSEMIDE 10 MG/ML IJ SOLN
40.0000 mg | Freq: Once | INTRAMUSCULAR | Status: AC
  Administered 2014-05-04: 40 mg via INTRAVENOUS
  Filled 2014-05-04: qty 4

## 2014-05-04 MED ORDER — POTASSIUM CHLORIDE 20 MEQ/15ML (10%) PO SOLN
30.0000 meq | ORAL | Status: AC
  Administered 2014-05-04 (×2): 30 meq
  Filled 2014-05-04 (×2): qty 30

## 2014-05-04 MED ORDER — FUROSEMIDE 10 MG/ML IJ SOLN: 40.0000 mg | Freq: Two times a day (BID) | INTRAMUSCULAR | Status: DC

## 2014-05-04 MED ORDER — POTASSIUM CHLORIDE CRYS ER 20 MEQ PO TBCR: 20.0000 meq | EXTENDED_RELEASE_TABLET | ORAL | Status: DC

## 2014-05-04 MED ORDER — POTASSIUM CHLORIDE CRYS ER 20 MEQ PO TBCR
40.0000 meq | EXTENDED_RELEASE_TABLET | Freq: Once | ORAL | Status: AC
Start: 2014-05-04 — End: 2014-05-04
  Administered 2014-05-04: 40 meq via ORAL
  Filled 2014-05-04: qty 2

## 2014-05-04 NOTE — Progress Notes (Signed)
[facility] ADULT ICU REPLACEMENT PROTOCOL FOR AM LAB REPLACEMENT ONLY  The patient does apply for the [facility] Adult ICU Electrolyte Replacment Protocol based on the criteria listed below:   1. Is GFR >/= 40 ml/min? Yes.    Patient's GFR today is >90 2. Is urine output >/= 0.5 ml/kg/hr for the last 6 hours? Yes.   Patient's UOP is .64 ml/kg/hr 3. Is BUN < 60 mg/dL? Yes.    Patient's BUN today is 12 4. Abnormal electrolyte K 3.1 5. Ordered repletion with: per protocol 6. If a panic level lab has been reported, has the CCM MD in charge been notified? Yes.  .   Physician:  E Deterding  Christeen Douglas 05/04/2014 5:55 AM

## 2014-05-04 NOTE — Progress Notes (Signed)
PULMONARY / CRITICAL CARE MEDICINE   Name: [name] MRN: [id number] DOB: [birth year]    ADMISSION DATE:  04/29/2014 CONSULTATION DATE:  05/04/2014  REFERRING MD :  Martin[place]  CHIEF COMPLAINT:  STEMI  INITIAL PRESENTATION:  74 y.o. M brought to [facility] ED 1/22 with chest pain, SOB, N/V, diaphoresis.  He was found to have bradycardia and acute inferior STEMI and was subsequently taken to the cath lab for emergent PCI.  In cath lab, had worsening cardiogenic shock and developed CHB requiring temp pacer wire as well as IABP but did have successful balloon angioplasty of LCx and distal left main.  He proceeded to have multiple episodes of VT / VF requiring defib x 13.  He was intubated and started on Levophed, Amiodarone, Lidocaine.  PCCM was consulted for vent management.  STUDIES:  EKG 1/22 >>> acute inferoposterior STEMI pCXR 1/24: small pleural effusions b/l, stable L retrocardiac atelectasis pCXR 1/25: slight improvement in left base  Lines: OETT 1/22>> NGT RIJ  SIGNIFICANT EVENTS: 1/22 - taken emergently to cath lab, unable to stent LAD due to significant calcification, LCx and distal left main balloon angioplastied, intubated 1/24: Cardiogenic shock, remains on pressors 1/25: Off pressors, episodes of Bradycardia and Hypotension, change of rhythm but back to SR, IABP removed yesterday  SUBJECTIVE: Lying in bed, awake, on mechanical ventilation  VITAL SIGNS: Temp:  [98.5 F (36.9 C)-100.2 F (37.9 C)] 98.7 F (37.1 C) (01/26 0400) Pulse Rate:  [74-82] 75 (01/26 0400) Resp:  [15-28] 16 (01/26 0600) BP: (101-126)/(44-64) 101/51 mmHg (01/26 0400) SpO2:  [87 %-100 %] 99 % (01/26 0732) Arterial Line BP: (94-141)/(47-67) 101/50 mmHg (01/26 0600) FiO2 (%):  [40 %-50 %] 40 % (01/26 0732) Weight:  [185 lb 13.6 oz (84.3 kg)] 185 lb 13.6 oz (84.3 kg) (01/26 0308) HEMODYNAMICS:   VENTILATOR SETTINGS: Vent Mode:  [-] PRVC FiO2 (%):  [40 %-50 %] 40 % Set Rate:  [16 bmp] 16 bmp Vt  Set:  [600 mL] 600 mL PEEP:  [5 cmH20] 5 cmH20 Pressure Support:  [5 cmH20] 5 cmH20 Plateau Pressure:  [15 cmH20-19 cmH20] 15 cmH20 INTAKE / OUTPUT: Intake/Output      01/25 0701 - 01/26 0700 01/26 0701 - 01/27 0700   I.V. (mL/kg) 1878.3 (22.3)    NG/GT 499.8    IV Piggyback 400    Total Intake(mL/kg) 2778.2 (33)    Urine (mL/kg/hr) 760 (0.4)    Emesis/NG output 200 (0.1)    Total Output 960     Net +1818.2            PHYSICAL EXAMINATION: General: lying in bed, on mechanical ventilation Neuro: Awake, appears irritated HEENT: ETT in place, coughing Cardiovascular: RRR Lungs: Coarse b/l breath sounds Abdomen: BS present, soft Musculoskeletal: edematous extremities, moving all extremities Skin: Warm  LABS:  CBC  Recent Labs Lab 05/02/14 0425 05/03/14 0318 05/04/14 0300  WBC 9.3 6.7 6.2  HGB 11.1* 10.2* 10.3*  HCT 31.6* 28.7* 28.8*  PLT 107* 95* 108*   Coag's  Recent Labs Lab 04/29/14 2130  APTT 31  INR 1.01   BMET  Recent Labs Lab 05/02/14 0425 05/03/14 0318 05/04/14 0300  NA 134* 134* 134*  K 3.6 3.2* 3.1*  CL 102 101 102  CO2 25 25 24   BUN 11 10 12   CREATININE 1.13 0.95 0.86  GLUCOSE 134* 109* 113*   Electrolytes  Recent Labs Lab 04/30/14 0845  05/02/14 0425 05/03/14 0318 05/04/14 0300  CALCIUM  --   < >  7.6* 7.6* 7.7*  MG 1.8  --   --   --  2.1  PHOS 3.1  --   --   --  2.6  < > = values in this interval not displayed. Sepsis Markers  Recent Labs Lab 04/30/14 0815 04/30/14 1415 05/01/14 0043 05/01/14 0044 05/02/14 0425 05/03/14 0318  LATICACIDVEN 1.5 0.9  --  1.1  --   --   PROCALCITON  --   --  1.17  --  0.68 0.46   ABG  Recent Labs Lab 04/30/14 0230 05/03/14 0938  PHART 7.283* 7.418  PCO2ART 44.2 33.0*  PO2ART 178.0* 95.0   Cardiac Enzymes  Recent Labs Lab 04/30/14 0255 04/30/14 0845 04/30/14 1415  TROPONINI 9.65* 59.45* >80.00*   Glucose  Recent Labs Lab 05/03/14 0721 05/03/14 1136 05/03/14 1555  05/03/14 2007 05/03/14 2345 05/04/14 0304  GLUCAP 123* 138* 85 109* 98 105*   Imaging Dg Chest Port 1 View  05/03/2014   CLINICAL DATA:  ETT advanced.  Respiratory failure  EXAM: PORTABLE CHEST - 1 VIEW  COMPARISON:  05/02/2014  FINDINGS: The endotracheal tube tip is midway between the clavicular heads and the tracheal carina. The nasogastric tube extends well into the stomach and off the inferior edge of the image. Right jugular central line extends into the SVC about 1 cm below the azygos vein junction.  No pneumothorax is evident. No large effusions are evident. Mild basilar opacities are present, partially cleared on the left where there formerly was a more confluent opacity.  IMPRESSION: Lines and tubes as described.  Slight improvement in the left base.   Electronically Signed   By: Andreas Newport M.D.   On: 05/03/2014 16:16    ASSESSMENT / PLAN:  CARDIOVASCULAR A:  Acute inferior STEMI - s/p emergent PCI with balloon angioplasty to LCx and left main 1/22 Complete heart block - s/p temp wire placement 1/22 Cardiogenic Shock - s/p IABP placement 1/22, off pressors Hx HLD, PVD PAF Bradycardia--intermittent  P:  Cardiology following CVTS to be consulted--?candidate for redo bypass grafting Amiodarone gtt, may need to hold if brady noted further  PULMONARY OETT 1/22 >>> A: Acute respiratory failure COPD without evidence of exacerbation Tobacco use disorder High secretion comcerns P:   VAP bundle: 600/16/5/40, on min support, for wean cpap 5 ps 5, goal 1 hr, needs neg baalnce F/u ABG DuoNebs / Albuterol. F/u cxr with diuresis - goal neg 1.5 liters  RENAL A:   Hypokalemia mild Hyponatremia mild pulm edema P:   NS @ 50--KVO today BMP in AM Replacing K Monitor I/O's +6.6L since admission Would favor lasix - start 40 bid Neg goal 1 lliter  GASTROINTESTINAL A:   GI prophylaxis Nutrition Hx Diverticulosis P:   SUP: Pantoprazole. TF to goal  HEMATOLOGIC A:    VTE Prophylaxis Anemia Thrombocytopenia (at risk hemolysis IABP- not noted thus far), r/o type 1 HITT, dilutional contribution--improving P:  SCD's / still needing heparin gtt? Per cards, consider dc at 48 hr CBC in AM Elevated LDH 299 and haptoglobin 239, Indirect bili 0.8 F/u smear  INFECTIOUS A:   ?PNA-- tmax 102.30f this admission Procalcitonin neg, all less 2 LA 1.1 Unimpressed infection P:   vanco 1/23 >> 1/25 Zosyn 1/23 >> 1/25  Blood  cx 1/23: NGTD x2 Sputum cx 1/23>>> -d/c abx 1/25 Follow secretion status closely  ENDOCRINE CBG (last 3)  A:   Hyperglycemia P:   SSI if glucose consistently > 180  NEUROLOGIC A:  Acute metabolic encephalopathy Hx TIA, peripheral neuropathy P:   Off all sedation Prn fent RASS goal: 0  Daily WUA Holding outpatient lorazepam  Family updated: None.  Interdisciplinary Family Meeting v Palliative Care Meeting:  Due by: 1/29  Signed: Jerene Pitch, MD PGY-3, Internal Medicine Resident Pager: [phone]  05/04/2014,8:28 AM   STAFF NOTE: Linwood Dibbles, MD FACP have personally reviewed patient's available data, including medical history, events of note, physical examination and test results as part of my evaluation. I have discussed with resident/NP and other care providers such as pharmacist, RN and RRT. In addition, I personally evaluated patient and elicited key findings of: needs to wean, cpap5 ps 5, goal , secretions are an issue, continued TF, lasix to neg 1.5 liters goal, pcxr to follow, coarse BS The patient is critically ill with multiple organ systems failure and requires high complexity decision making for assessment and support, frequent evaluation and titration of therapies, application of advanced monitoring technologies and extensive interpretation of multiple databases.   Critical Care Time devoted to patient care services described in this note is30 Minutes. This time reflects time of care of this signee:  Merrie Roof, MD FACP. This critical care time does not reflect procedure time, or teaching time or supervisory time of PA/NP/Med student/Med Resident etc but could involve care discussion time. Rest per NP/medical resident whose note is outlined above and that I agree with   Lavon Paganini. Titus Mould, MD, [place] Pgr: [facility] 05/04/2014 11:47 AM

## 2014-05-04 NOTE — Progress Notes (Signed)
6 fr arterial sheath aspirated and removed from rfa. Manual pressure applied for 30 minutes. BP 114/47 hr 71. No s+s of hematoma, groin level zero. Tegaderm dressing applied. Patient intubated and sedated, unable to receive bedrest instructions. Bilateral PT pulses easily palpable. Bilateral dp pulses weak and barely palpable.

## 2014-05-04 NOTE — Progress Notes (Addendum)
100 mls of fentanyl and 30 mls of versed wasted in sink and witnessed by C. Kaylah Chiasson, RN

## 2014-05-04 NOTE — Progress Notes (Signed)
PROGRESS NOTE  Subjective:   D is a 74 year old gentleman with a history of coronary artery disease-status post coronary artery bypass grafting. His admitted on January 21 with an inferior posterior ST segment elevation myocardial infarction. Was compensated with complete heart block and ventricular talked tachycardia arrest. Cardiac cath revealed left dominant circulation. He he had an atretic internal mammary artery. The saphenous vein graft to the OM was occluded.  He required temporary venous pacemaker and an intra-aortic balloon pump for cardiogenic shock. At that time he was felt not to be a good candidate for redo cabbage.    Dr. Martinique was able to angioplasty  the left main and left circumflex with minimal improvement.  BP has been stable. He is off pressors.  The IABP has been pulled.   Objective:    Vital Signs:   Temp:  [98.5 F (36.9 C)-100.2 F (37.9 C)] 98.7 F (37.1 C) (01/26 0400) Pulse Rate:  [74-82] 75 (01/26 0400) Resp:  [15-28] 16 (01/26 0600) BP: (101-126)/(44-64) 101/51 mmHg (01/26 0400) SpO2:  [87 %-100 %] 99 % (01/26 0732) Arterial Line BP: (94-141)/(47-67) 101/50 mmHg (01/26 0600) FiO2 (%):  [40 %-50 %] 40 % (01/26 0732) Weight:  [185 lb 13.6 oz (84.3 kg)] 185 lb 13.6 oz (84.3 kg) (01/26 0308)  Last BM Date:  (unknown)   24-hour weight change: Weight change: 3 lb 15.5 oz (1.8 kg)  Weight trends: Filed Weights   05/02/14 0320 05/03/14 0428 05/04/14 0308  Weight: 177 lb 7.5 oz (80.5 kg) 181 lb 14.1 oz (82.5 kg) 185 lb 13.6 oz (84.3 kg)    Intake/Output:  01/25 0701 - 01/26 0700 In: 2778.2 [I.V.:1878.3; NG/GT:499.8; IV Piggyback:400] Out: 960 [Urine:760; Emesis/NG output:200]     Physical Exam: BP 101/51 mmHg  Pulse 75  Temp(Src) 98.7 F (37.1 C) (Oral)  Resp 16  Ht 5\' 11"  (1.803 m)  Wt 185 lb 13.6 oz (84.3 kg)  BMI 25.93 kg/m2  SpO2 99%  Wt Readings from Last 3 Encounters:  05/04/14 185 lb 13.6 oz (84.3 kg)  04/14/14 172 lb  (78.019 kg)  01/11/14 168 lb (76.204 kg)    General: Vital signs reviewed and noted.  BP is ok with the IABP in standby mode   Head: Normocephalic, atraumatic.  Eyes: conjunctivae/corneas clear.  EOM's intact.   Throat: intubated  Neck:  normal   Lungs:    on vent   Heart:  RR   Abdomen:  Soft, non-tender, non-distended    Extremities: No edema.  Slight echymosis on arms  , IABP has been removed.  RFA sheath in place.   Neurologic:  he's and on sedation. The sedation was recently stopped. He opens his eyes. He did not respond to any commands this morning. The nurse reports that he has been responding fairly normally.  Moving arms.   Psych:  unable to assess at this time.    Labs: BMET:  Recent Labs  05/03/14 0318 05/04/14 0300  NA 134* 134*  K 3.2* 3.1*  CL 101 102  CO2 25 24  GLUCOSE 109* 113*  BUN 10 12  CREATININE 0.95 0.86  CALCIUM 7.6* 7.7*  MG  --  2.1  PHOS  --  2.6    Liver function tests:  Recent Labs  05/03/14 1000  BILITOT 1.1   No results for input(s): LIPASE, AMYLASE in the last 72 hours.  CBC:  Recent Labs  05/03/14 0318 05/04/14 0300  WBC 6.7 6.2  HGB  10.2* 10.3*  HCT 28.7* 28.8*  MCV 93.8 93.5  PLT 95* 108*    Cardiac Enzymes: No results for input(s): CKTOTAL, CKMB, TROPONINI in the last 72 hours.  Coagulation Studies: No results for input(s): LABPROT, INR in the last 72 hours.  Other: Invalid input(s): POCBNP No results for input(s): DDIMER in the last 72 hours. No results for input(s): HGBA1C in the last 72 hours. No results for input(s): CHOL, HDL, LDLCALC, TRIG, CHOLHDL in the last 72 hours. No results for input(s): TSH, T4TOTAL, T3FREE, THYROIDAB in the last 72 hours.  Invalid input(s): FREET3 No results for input(s): VITAMINB12, FOLATE, FERRITIN, TIBC, IRON, RETICCTPCT in the last 72 hours.   Other results:  Tele:  NSR   Medications:    Infusions: . sodium chloride 50 mL/hr (05/02/14 1638)  . sodium chloride 10  mL/hr at 05/01/14 2100  . amiodarone 30 mg/hr (05/03/14 2345)  . feeding supplement (VITAL AF 1.2 CAL) 1,000 mL (05/04/14 0400)  . fentaNYL infusion INTRAVENOUS 50 mcg/hr (05/03/14 1132)  . heparin 1,700 Units/hr (05/04/14 0030)  . midazolam (VERSED) infusion 1 mg/hr (05/03/14 1227)  . norepinephrine (LEVOPHED) Adult infusion Stopped (05/02/14 2300)    Scheduled Medications: . antiseptic oral rinse  7 mL Mouth Rinse QID  . aspirin  81 mg Per Tube Daily  . atorvastatin  80 mg Per Tube q1800  . chlorhexidine  15 mL Mouth Rinse BID  . feeding supplement (PRO-STAT SUGAR FREE 64)  30 mL Oral Daily  . fentaNYL  50 mcg Intravenous Once  . insulin aspart  0-9 Units Subcutaneous 6 times per day  . ipratropium-albuterol  3 mL Nebulization Q6H  . pantoprazole sodium  40 mg Per Tube Daily  . piperacillin-tazobactam (ZOSYN)  IV  3.375 g Intravenous 3 times per day  . potassium chloride  30 mEq Per Tube Q4H  . vancomycin  750 mg Intravenous Q12H    Assessment/ Plan:     1.   STEMI (ST elevation myocardial infarction)  the patient was found have severe three-vessel coronary artery disease. Dr. Martinique was able to open the left main slightly and was able to open the circumflex artery slightly. He was unable to wire the left anterior descending artery because of tortuosity and an acute angle.  We will continue with current medical therapy. We'll discuss with thoracic surgeons to see if he may be a candidate for redo bypass grafting as he improves.    2. Cardiogenic shock:  He is currently off pressures. We placed the intra-aortic balloon pump on standby and remained quite stable. We'll anticipate pulling the intra-aortic balloon pump this morning. I've called Cath Lab. The nurse will need to check ACTs  on a scheduled basis and will call the Cath Lab when ACTs  are acceptable.     3. Cardiac arrest:  He seems to be doing better.    4. VT (ventricular tachycardia):  Is currently on amiodarone drip.  Continue the drip now.    5. CHB (complete heart block):  Improved.   6.  hypokalemia:  We gave him Kdur 80 meq yesterday.   Levels are still low.  Will supplement more today.    Disposition:     Pull RFA sheath.  Can place a radial art line if needed.    Length of Stay: 5  Thayer Headings, Brooke Bonito., MD, [facility] 05/04/2014, 8:23 AM Office [phone] Pager [phone]

## 2014-05-04 NOTE — Progress Notes (Signed)
ANTICOAGULATION CONSULT NOTE   Pharmacy Consult for Heparin  Indication: chest pain/ACS,   Patient Measurements: Height: 5\' 11"  (180.3 cm) Weight: 181 lb 14.1 oz (82.5 kg) IBW/kg (Calculated) : 75.3  Vital Signs: Temp: 98.5 F (36.9 C) (01/25 2000) Temp Source: Oral (01/25 2000) BP: 123/44 mmHg (01/26 0000) Pulse Rate: 80 (01/26 0000)  Labs:  Recent Labs  05/01/14 0430  05/02/14 0425  05/02/14 1700 05/03/14 0130 05/03/14 0318 05/03/14 2350  HGB 11.3*  --  11.1*  --   --   --  10.2*  --   HCT 32.5*  --  31.6*  --   --   --  28.7*  --   PLT 125*  --  107*  --   --   --  95*  --   HEPARINUNFRC <0.10*  < >  --   < > 0.15* 0.19*  --  0.13*  CREATININE 1.08  --  1.13  --   --   --  0.95  --   < > = values in this interval not displayed. Estimated Creatinine Clearance: 71.6 mL/min (by C-G formula based on Cr of 0.95).   Assessment: 74 yo male with STEMI s/p cath/IABP, now with possible recurrent ischemia, for heparin  Goal of Therapy:  Heparin level= 0.3-0.5 Monitor platelets by anticoagulation protocol: Yes   Plan:  Increase Heparin 1700 units/hr Follow-up am labs.    Phillis Knack, PharmD, BCPS  05/04/2014 12:32 AM

## 2014-05-04 NOTE — Progress Notes (Signed)
ANTICOAGULATION CONSULT NOTE   Pharmacy Consult for Heparin  Indication: chest pain/ACS, s/p cath   Patient Measurements: Height: 5\' 11"  (180.3 cm) Weight: 185 lb 13.6 oz (84.3 kg) IBW/kg (Calculated) : 75.3  Vital Signs: Temp: 98.7 F (37.1 C) (01/26 0400) Temp Source: Oral (01/26 0400) BP: 117/47 mmHg (01/26 0800) Pulse Rate: 75 (01/26 0400)  Labs:  Recent Labs  05/02/14 0425  05/03/14 0130 05/03/14 0318 05/03/14 2350 05/04/14 0300 05/04/14 0740  HGB 11.1*  --   --  10.2*  --  10.3*  --   HCT 31.6*  --   --  28.7*  --  28.8*  --   PLT 107*  --   --  95*  --  108*  --   HEPARINUNFRC  --   < > 0.19*  --  0.13*  --  0.37  CREATININE 1.13  --   --  0.95  --  0.86  --   < > = values in this interval not displayed. Estimated Creatinine Clearance: 79 mL/min (by C-G formula based on Cr of 0.86).   Assessment: 74 yo male with STEMI s/p cath and IABP. The IABP was removed 1/25 with plans to d/c heparin. There is concern for recurrent ischemia and Dr. Cathie Olden has consulted pharmacy to restart heparin. Heparin level is at goal on 1700 units/hr. Possible redo bypass grafting if status improves.   Goal of Therapy:  Heparin level= 0.3-0.5 Monitor platelets by anticoagulation protocol: Yes   Plan:  -No heparin changes needed -Will confirm a heparin level later today -Daily heparin level and CBC  Hildred Laser, Pharm D 05/04/2014 9:38 AM

## 2014-05-04 NOTE — Progress Notes (Signed)
Patient ID: [name], male   DOB: 23-May-1940, 74 y.o.   MRN: [id number]      [address]       [place] [address]             [phone]        [place] [facility] Medical Record #[id number] Date of Birth: 13-Apr-1940  Referring: Dr Martinique Primary Care: Walker Kehr, MD  Chief Complaint:    Chief Complaint  Patient presents with  . Code STEMI    History of Present Illness:     Asked by Dr. Acie Fredrickson to see the patient today to consider treatment options. Currently the patient is significantly sedated on a ventilator, just had intra-aortic balloon pump removed. The patient is a [name] with history of CAD s/p CABG 1993 with LIMA to the circumflex and reversed saphenous vein graft to the PDA 07/13/1991 by me when he was  50 with unstable angina. In 1999  the LIMA graft occluded but the vein graft supplied his whole circumflex system so he has not had problems. He has  PVD s/p PCI to left SFA and right SFA, hyperlipidemia.  He  presented to ED via EMS for inf-posterior STEMI and complete HB.  He apparently developed chest pain about PTA, left sided, pressure like and associated with N/V. Upon arrival to ED CP still 6/10, he is s/p ASA 325 mg, 4 mg IV Morphine, 1 x Nitro by EMS. His BP 80/40s with HR 30s in CHB, Dopamine started and uptitrated to 15, with improvement of BP to systolic 427C and HR to high 40s. Pt also required morphine 2 mg x1, Zofran 4 mg x2 in ED due to persistent N/V. Plavix loading attempted x 2 (300 mg each) but pt threw up each time. Therefore, NO PLAVIX was taken in by patient. Hepatin gtt started and patient brought to cath lab emergently. He was found to have critical LM disease and LIMA graft was found to be atretic. SVGs found to be occluded but probably chronic. Patient had frequent VT/VF, requiring multiple defib x13, Amio bolus -> gtt and Lido bolus -> gtt. He was subsequently intubated for airway protection. Temp wire pacer and  IABP were inserted.  The patient now is sedated on a ventilator, with chest x-ray consistent with congestive heart failure,currently intubated sedated on vent, tube feeding, IAB removed yesterday underlying airspace consolidation from infection or aspiration. The patient has been a long-term smoker and smoking up until admission.  Current Activity/ Functional Status: Patient was independent with mobility/ambulation, transfers, ADL's, IADL's.   Zubrod Score: At the time of surgery this patient's most appropriate activity status/level should be described as: []     0    Normal activity, no symptoms []     1    Restricted in physical strenuous activity but ambulatory, able to do out light work []     2    Ambulatory and capable of self care, unable to do work activities, up and about                 more than 50%  Of the time                            []     3    Only limited self care, in bed greater than 50% of waking hours [x]     4    Completely disabled, no self  care, confined to bed or chair []     5    Moribund   Past Medical History  Diagnosis Date  . CAD (coronary artery disease)   . History of transient ischemic attack (TIA)   . Hyperlipidemia   . Peripheral neuropathy   . PVD (peripheral vascular disease) with claudication   . Carotid bruit     bilateral  . Preoperative examination   . Bronchitis, acute   . Ventral hernia   . Personal history of prostate cancer   . Diverticulosis   . ALLERGIC RHINITIS   . Osteoarthritis   . Low back pain   . COPD (chronic obstructive pulmonary disease)   . Tobacco use disorder, continuous   . S/P CABG x 2 05/04/2014    CABG x 2 with LIMA to Circumflex and vein graft to PDA  07/13/1991    Past Surgical History  Procedure Laterality Date  . Coronary artery bypass graft  1992  . Lumbar laminectomy  [id number]     X2  . Prostatectomy  05/2008    Dr. Alinda Money and XRT  . Appendectomy    . Tonsillectomy    . Left heart catheterization with  coronary angiogram N/A 04/30/2014    Procedure: LEFT HEART CATHETERIZATION WITH CORONARY ANGIOGRAM;  Surgeon: Peter M Martinique, MD;  Location: [facility] CATH LAB;  Service: Cardiovascular;  Laterality: N/A;    History  Smoking status  . Current Every Day Smoker -- 40 years  . Types: Cigarettes    History  Alcohol Use No    History   Social History  . Marital Status: Married    Spouse Name: N/A    Number of Children: N/A  . Years of Education: N/A   Occupational History  . Retired-self employed    Social History Main Topics  . Smoking status: Current Every Day Smoker -- 40 years    Types: Cigarettes  . Smokeless tobacco: Not on file  . Alcohol Use: No  . Drug Use: No  . Sexual Activity: Yes      Allergies  Allergen Reactions  . Codeine Sulfate Itching    Current Facility-Administered Medications  Medication Dose Route Frequency Provider Last Rate Last Dose  . 0.9 %  sodium chloride infusion   Intravenous Continuous Peter M Martinique, MD 10 mL/hr at 05/04/14 1000 10 mL/hr at 05/04/14 1000  . acetaminophen (TYLENOL) tablet 650 mg  650 mg Oral Q4H PRN Collene Gobble, MD      . albuterol (PROVENTIL) (2.5 MG/3ML) 0.083% nebulizer solution 2.5 mg  2.5 mg Nebulization Q3H PRN Rahul P Desai, PA-C      . amiodarone (NEXTERONE PREMIX) 360 MG/200ML (1.8 mg/mL) IV infusion  30 mg/hr Intravenous Continuous Manus Gunning, MD 16.7 mL/hr at 05/04/14 1153 30 mg/hr at 05/04/14 1153  . antiseptic oral rinse (CPC / CETYLPYRIDINIUM CHLORIDE 0.05%) solution 7 mL  7 mL Mouth Rinse QID Peter M Martinique, MD   7 mL at 05/04/14 1200  . aspirin chewable tablet 81 mg  81 mg Per Tube Daily Jolaine Artist, MD   81 mg at 05/04/14 0953  . atorvastatin (LIPITOR) tablet 80 mg  80 mg Per Tube q1800 Jolaine Artist, MD   80 mg at 05/03/14 1732  . chlorhexidine (PERIDEX) 0.12 % solution 15 mL  15 mL Mouth Rinse BID Peter M Martinique, MD   15 mL at 05/04/14 0745  . feeding supplement (PRO-STAT SUGAR FREE 64) liquid 30 mL   30 mL Oral  Daily Rocky Crafts Hammons, RPH   30 mL at 05/04/14 4401  . feeding supplement (VITAL AF 1.2 CAL) liquid 1,000 mL  1,000 mL Per Tube Continuous Heather Cornelison Pitts, RD 50 mL/hr at 05/04/14 1200 1,000 mL at 05/04/14 1200  . fentaNYL (SUBLIMAZE) 2,500 mcg in sodium chloride 0.9 % 250 mL (10 mcg/mL) infusion  25-400 mcg/hr Intravenous Continuous Rahul P Desai, PA-C 5 mL/hr at 05/04/14 0905 50 mcg/hr at 05/04/14 0905  . fentaNYL (SUBLIMAZE) bolus via infusion 25 mcg  25 mcg Intravenous Q1H PRN Rahul P Desai, PA-C   25 mcg at 05/01/14 0758  . fentaNYL (SUBLIMAZE) injection 50 mcg  50 mcg Intravenous Once Rahul P Desai, PA-C      . furosemide (LASIX) injection 40 mg  40 mg Intravenous BID Wilber Oliphant, MD      . heparin ADULT infusion 100 units/mL (25000 units/250 mL)  1,700 Units/hr Intravenous Continuous Peter M Martinique, MD 17 mL/hr at 05/04/14 0852 1,700 Units/hr at 05/04/14 0852  . insulin aspart (novoLOG) injection 0-9 Units  0-9 Units Subcutaneous 6 times per day Jolaine Artist, MD   1 Units at 05/03/14 1147  . ipratropium-albuterol (DUONEB) 0.5-2.5 (3) MG/3ML nebulizer solution 3 mL  3 mL Nebulization Q6H Rahul P Desai, PA-C   3 mL at 05/04/14 0732  . midazolam (VERSED) 50 mg in sodium chloride 0.9 % 50 mL (1 mg/mL) infusion  0-10 mg/hr Intravenous Continuous Rahul P Desai, PA-C 2 mL/hr at 05/04/14 1153 2 mg/hr at 05/04/14 1153  . midazolam (VERSED) bolus via infusion 1-2 mg  1-2 mg Intravenous Q2H PRN Rahul P Desai, PA-C   2 mg at 05/01/14 0758  . norepinephrine (LEVOPHED) 4 mg in dextrose 5 % 250 mL (0.016 mg/mL) infusion  2-50 mcg/min Intravenous Continuous Rahul P Desai, PA-C   Stopped at 05/02/14 2300  . ondansetron (ZOFRAN) injection 4 mg  4 mg Intravenous Q6H PRN Manus Gunning, MD      . pantoprazole sodium (PROTONIX) 40 mg/20 mL oral suspension 40 mg  40 mg Per Tube Daily Peter M Martinique, MD   40 mg at 05/04/14 0954  . piperacillin-tazobactam (ZOSYN) IVPB 3.375 g  3.375 g  Intravenous 3 times per day Narda Bonds, RPH   3.375 g at 05/04/14 1325  . vancomycin (VANCOCIN) IVPB 750 mg/150 ml premix  750 mg Intravenous Q12H Narda Bonds, RPH   750 mg at 05/04/14 0272    Prescriptions prior to admission  Medication Sig Dispense Refill Last Dose  . acetaminophen (TYLENOL) 325 MG tablet Take 975 mg by mouth 3 (three) times daily as needed (pain).   unknown  . amLODipine (NORVASC) 5 MG tablet TAKE 1 TABLET DAILY (Patient taking differently: Take 5 mg by mouth daily. ) 90 tablet 3 1/20 or 1/21  . clobetasol cream (TEMOVATE) 5.36 % Apply 1 application topically 3 (three) times daily as needed (rash). Do not use on face or genitals   unknown  . fluticasone (FLONASE) 50 MCG/ACT nasal spray Place 1 spray into both nostrils 2 (two) times daily.   1/20 or 1/21  . isosorbide mononitrate (IMDUR) 30 MG 24 hr tablet TAKE 1 TABLET DAILY (Patient taking differently: Take 30 mg by mouth daily. ) 90 tablet 3 1/20 or 1/21  . loratadine (CLARITIN) 10 MG tablet Take 10 mg by mouth daily as needed for allergies.    unknown  . LORazepam (ATIVAN) 0.5 MG tablet Take 0.5 mg by mouth at bedtime as needed for anxiety.  unknown  . metoprolol (LOPRESSOR) 50 MG tablet Take 0.5 tablets (25 mg total) by mouth daily. 45 tablet 3 1/20 or 1/21 at could not verify time  . nitroGLYCERIN (NITROSTAT) 0.4 MG SL tablet Place 0.4 mg under the tongue every 5 (five) minutes as needed for chest pain.   04/29/2014 at am  . omeprazole (PRILOSEC) 20 MG capsule Take 20 mg by mouth daily.     04/29/2014 at am  . PRESCRIPTION MEDICATION Apply 1 application topically 2 (two) times daily. Sodium fluoride 1% gel - apply thin ribbon to teeth with toothbrush   1/20 or 1/21  . simvastatin (ZOCOR) 20 MG tablet TAKE 1 TABLET AT BEDTIME (Patient taking differently: Take 20 mg by mouth at bedtime. ) 90 tablet 3 04/28/2014  . traMADol (ULTRAM) 50 MG tablet Take 50-100 mg by mouth 2 (two) times daily as needed (pain).    unknown  .  triamcinolone ointment (KENALOG) 0.5 % Apply 1 application topically 3 (three) times daily. (Patient taking differently: Apply 1 application topically 2 (two) times daily as needed (rash/ scaly patches). Apply thin amount) 30 g 1 unknown  . clobetasol ointment (TEMOVATE) 7.67 % Apply 1 application topically 2 (two) times daily. (Patient not taking: Reported on 04/30/2014) 60 g 3 Not Taking at Unknown time    Family History  Problem Relation Age of Onset  . Hypertension    . Cancer Mother     Colon  . Heart disease Father   . Coronary artery disease Father   . Cancer Sister   . Heart disease Brother      Review of Systems:  Review of systems not obtained due to patient factors. - patient sedated on vent, records reviewed and history from family    Cardiac Review of Systems: Y or N  Chest Pain [  n  ]  Resting SOB [ n  ] Exertional SOB  Blue.Reese  ]  Orthopnea [  ]   Pedal Edema [ n  ]    Palpitations [ n ] Syncope  Blue.Reese  ]   Presyncope [   ]  General Review of Systems: [Y] = yes [  ]=no Constitional: recent weight change [ n ]; anorexia [ n ]; fatigue [  ]; nausea [  ]; night sweats [  ]; fever [  ]; or chills [  ]                                                               Dental: poor dentition[  ]; Last Dentist visit:   Eye : blurred vision [  ]; diplopia [   ]; vision changes [  ];  Amaurosis fugax[  ]; Resp: cough [ n ];  wheezing[  n;  hemoptysis[ n ]; shortness of breath[  ]; paroxysmal nocturnal dyspnea[  ]; dyspnea on exertion[ y ]; or orthopnea[  ];  GI:  gallstones[  ], vomiting[  ];  dysphagia[  ]; melena[  ];  hematochezia [  ]; heartburn[  ];   Hx of  Colonoscopy[  ]; GU: kidney stones [  ]; hematuria[  ];   dysuria [  ];  nocturia[  ];  history of     obstruction [  ]; urinary frequency [  ]  Skin: rash, swelling[  ];, hair loss[  ];  peripheral edema[  ];  or itching[  ]; Musculosketetal: myalgias[  ];  joint swelling[  ];  joint erythema[  ];  joint pain[  ];  back  pain[  ];  Heme/Lymph: bruising[  ];  bleeding[  ];  anemia[  ];  Neuro: TIA[ y ];  headaches[  ];  stroke[  ];  vertigo[  ];  seizures[  ];   paresthesias[  ];  difficulty walking[  ];  Psych:depression[ n ]; Jetty Peeks  ];  Endocrine: diabetes[  ];  thyroid dysfunction[  ];  Immunizations: Flu [  ]; Pneumococcal[  ];  Other:  Physical Exam: BP 99/47 mmHg  Pulse 80  Temp(Src) 98.6 F (37 C) (Oral)  Resp 19  Ht 5\' 11"  (1.803 m)  Wt 185 lb 13.6 oz (84.3 kg)  BMI 25.93 kg/m2  SpO2 93%   General appearance: slowed mentation and opens eyes follows some commands, sedated and on vent Head: Normocephalic, without obvious abnormality, atraumatic Neck: no adenopathy, no carotid bruit, no JVD, supple, symmetrical, trachea midline and thyroid not enlarged, symmetric, no tenderness/mass/nodules Lymph nodes: Cervical, supraclavicular, and axillary nodes normal. Resp: diminished breath sounds bibasilar Back: negative, symmetric, no curvature. ROM normal. No CVA tenderness. Cardio: regular rate and rhythm, S1, S2 normal, no murmur, click, rub or gallop GI: soft, non-tender; bowel sounds normal; no masses,  no organomegaly Genitalia: penis exam: nl male cath in place, prostate exam deferred Extremities: extremities normal, atraumatic, no cyanosis or edema, Homans sign is negative, no sign of DVT and vein harvest from right lowere leg in past Neurologic: Mental status: Alert, oriented, thought content appropriate, alertness: obtunded Palpable femoral pulses, dp and pt pulses bilateral   Diagnostic Studies & Laboratory data:     Recent Radiology Findings:   Dg Chest Port 1 View  05/04/2014   CLINICAL DATA:  74 year old male with acute respiratory failure on a ventilator.  EXAM: PORTABLE CHEST - 1 VIEW  COMPARISON:  Chest x-ray 05/03/2014.  FINDINGS: An endotracheal tube is in place with tip 5.9 cm above the carina. There is a right-sided internal jugular central venous catheter with tip terminating  in the distal superior vena cava. Transcutaneous pacer/defibrillator pads project over the mid and left hemithorax. A nasogastric tube is seen extending into the stomach, however, the tip of the nasogastric tube extends below the lower margin of the image. Lung volumes appear normal. There is cephalization of the pulmonary vasculature, indistinctness of the interstitial markings, and patchy airspace disease throughout the lungs bilaterally suggestive of moderate pulmonary edema. Small to moderate bilateral pleural effusions. Mild cardiomegaly. The patient is rotated to the right on today's exam, resulting in distortion of the mediastinal contours and reduced diagnostic sensitivity and specificity for mediastinal pathology. Atherosclerosis in the thoracic aorta.  IMPRESSION: 1. Support apparatus, as above. 2. The appearance the chest suggests underlying congestive heart failure, as above. 3. Bibasilar opacities presumably reflect a combination of atelectasis with superimposed small to moderate bilateral pleural effusions, however, underlying airspace consolidation from infection or aspiration is not excluded.   Electronically Signed   By: Vinnie Langton M.D.   On: 05/04/2014 11:34   Dg Chest Port 1 View  05/03/2014   CLINICAL DATA:  ETT advanced.  Respiratory failure  EXAM: PORTABLE CHEST - 1 VIEW  COMPARISON:  05/02/2014  FINDINGS: The endotracheal tube tip is midway between the clavicular heads and the tracheal carina. The nasogastric tube extends well into  the stomach and off the inferior edge of the image. Right jugular central line extends into the SVC about 1 cm below the azygos vein junction.  No pneumothorax is evident. No large effusions are evident. Mild basilar opacities are present, partially cleared on the left where there formerly was a more confluent opacity.  IMPRESSION: Lines and tubes as described.  Slight improvement in the left base.   Electronically Signed   By: Andreas Newport M.D.   On:  05/03/2014 16:16     I have independently reviewed the above radiologic studies.  Recent Lab Findings: Lab Results  Component Value Date   WBC 6.2 05/04/2014   HGB 10.3* 05/04/2014   HCT 28.8* 05/04/2014   PLT 108* 05/04/2014   GLUCOSE 113* 05/04/2014   CHOL 146 11/19/2012   TRIG 93.0 11/19/2012   HDL 38.50* 11/19/2012   LDLCALC 89 11/19/2012   ALT 16 11/19/2012   AST 17 11/19/2012   NA 134* 05/04/2014   K 3.1* 05/04/2014   CL 102 05/04/2014   CREATININE 0.86 05/04/2014   BUN 12 05/04/2014   CO2 24 05/04/2014   TSH 1.14 04/12/2011   INR 1.01 04/29/2014   HGBA1C 5.7 07/11/2006   CATH: Procedure: Left Heart Cath, Selective Coronary Angiography, SVG and LIMA angiography, LV angiography, PTCA of the left main coronary artery. Insertion of temporary pacemaker. Insertion of IABP. Multiple episodes of ventricular fibrillation requiring defibrillation x 13.   Indication: [name] with history of remote CABG in 1993 presents with Acute inferior STEMI associated with cardiogenic shock, complete heart block.   Diagnostic Procedure Details: The right groin was prepped, draped, and anesthetized with 1% lidocaine. Using the modified Seldinger technique, a 6 French sheath was introduced into the right femoral artery. Standard Judkins catheters were used for selective coronary and graft angiography and left ventriculography. Catheter exchanges were performed over a wire.   PROCEDURAL FINDINGS Hemodynamics: AO 109/44 mm Hg LV 118/27 mm Hg  Coronary angiography: Coronary dominance: left  Left mainstem: There is a 90% distal left main stenosis with heavy calcification.   Left anterior descending (LAD): There is an 80% ostial LAD stenosis followed by 2 aneurysmal segments. The remainder of the LAD is without significant disease. The first and second diagonals are without significant disease.   Left circumflex (LCx): The LCx gives off a moderately large OM and then is occluded.  There is a 95% ostial LCx stenosis.   Right coronary artery (RCA): The RCA is a nondominant vessel with an anterior takeoff. It is patent.   SVG to OM is occluded chronically.  LIMA to the LAD is atretic.   Left ventriculography: Left ventricular systolic function is abnormal, there is severe inferior wall hypokinesis. LVEF is estimated at 45%, there is no significant mitral regurgitation   PCI Procedure Note: During diagnostic angiogram the patient developed progressive cardiogenic shock and complete heart block. A Venous sheath was placed in the right femoral vein and a temporary transvenous pacemaker was inserted into the RV apex under fluoro guidance. The patient developed multiple episodes of ventricular fibrillation requiring a total of 13 DC shocks. He was loaded with IV amiodarone and lidocaine. He received CPR and pressor support. He required several amps of IV epinephrine. An additional arterial access was obtained via the left femoral artery and an IABP was placed via this access for hemodynamic support. The patient was intubated for control of his airway and ventilator support. Once diagnostic angiograms were performed the case was discussed  with Dr. Roxy Manns CT surgery who felt like at his age with redo bypass surgery and shock he would not survive. We elected to attempt PCI of the left main. The right femoral sheath was upsized to a 7 [place]. Weight-based heparin was given for anticoagulation. Once a therapeutic ACT was achieved, a 7 [place] XBLAD guide catheter was inserted. A Whisper coronary guidewire was used to cross the left main into the LCx/first OM. Multiple attempts were made unsuccessfully to place a wire down the LAD. We attempted to cross with a Prowater, Whisper, Choice PT, Luge, and Miracle 3 wires without success. The LAD was acutely angulated from the left main and heavily calcified. The wire tended to select a tiny side branch. There was also the aneurysmal segment in  the LAD. We elected to perform balloon angioplasty into the LCx and the distal Left main. The LCx was initially dilated with a 2.0 mm balloon. It was then dilated with a 2.5 mm angiosculpt balloon. The distal left main was dilated with a 3.0 mm noncompliant balloon. Given our inability to access the LAD I did not think that stenting was an option since stenting over the LAD would further compromise this vessel. Following PCI, there was 80% residual stenosis in the left main with 50% residual stenosis in the LCx ostium. The LAD was unchanged. There appeared to be TIMI 3 flow. At this point the patient's rhythm stabilized with pacing. He continued to require IV pressors and IABP for hemodynamic support. The patient was transferred to the ICU for further monitoring and continued support as noted above.  PCI Data: Vessel - Left main/Segment - distal/ ostial LCx Percent Stenosis (pre) 90%/95% TIMI-flow 2 PTCA only Percent Stenosis (post) 80%/50% TIMI-flow (post) 3 Unable to access the LAD with a wire.   Final Conclusions:  1. Critical left main and ostial LAD/LCx disease in a left dominant circulation. Chronic total occlusion of the mid LCx 2. Occluded SVG to OM- chronic 3. Atretic LIMA to the LAD 4. Mild to moderate LV dysfunction. 5. Cardiogenic shock requiring IABP and IV pressor support 6. Recurrent Vfib with CPR and multiple defibrillations. 7. Acute respiratory failure requiring intubation and ventilator support. 8. Complete heart block requiring temporary pacemaker placement.  9. Partially successful PTCA only of the distal left main and ostial LCx.   Recommendations: Transfer to ICU for continued hemodynamic support. He is not a candidate for further PCI. Prognosis is very guarded.   [name], [place]  04/30/2014, 1:11 AM  I have independently reviewed the above  cath films and reviewed the findings with the  Patient's family- correction to the above, LAD was to cir , vein  was to pda, no disease in lad at time of cabg in 1993 . In 1999 vein graft to pda was patent with collateral filling of cirx system, no lad disease .    Assessment / Plan:   Critical left main and ostial LAD/LCx disease in a left dominant circulation. Chronic total occlusion of the mid LCx  Acute respiratory failure requiring intubation and ventilator support  COPD with ongoing tobacco use  Occluded SVG to PDA-  may have been acute  Atretic LIMA to the LCx occluded since 1999  moderate LV dysfunction.  Cardiogenic shock requiring IABP and IV pressor support- IAB now out   Recurrent Vfib with CPR and multiple defibrillations.    Currently patient is not a surgical candidate because of significance co morbities  STS risk mortality >25% and risk or  mortality morbidity  75%, discussed dx and options with patients two sones and wife.  I  spent 60 minutes counseling the patient face to face and 50% or more the  time was spent in counseling and coordination of care. The total time spent in the appointment was 80 minutes.  Grace Isaac MD      [address] [place] [address] Office [phone]   Beeper [phone]  05/04/2014 2:16 PM

## 2014-05-04 NOTE — Progress Notes (Signed)
ANTICOAGULATION CONSULT NOTE   Pharmacy Consult for Heparin  Indication: chest pain/ACS, s/p cath   Patient Measurements: Height: 5\' 11"  (180.3 cm) Weight: 185 lb 13.6 oz (84.3 kg) IBW/kg (Calculated) : 75.3  Vital Signs: Temp: 99.7 F (37.6 C) (01/26 1600) Temp Source: Oral (01/26 1600) BP: 123/48 mmHg (01/26 1800) Pulse Rate: 80 (01/26 1211)  Labs:  Recent Labs  05/02/14 0425  05/03/14 0318 05/03/14 2350 05/04/14 0300 05/04/14 0740 05/04/14 1641  HGB 11.1*  --  10.2*  --  10.3*  --   --   HCT 31.6*  --  28.7*  --  28.8*  --   --   PLT 107*  --  95*  --  108*  --   --   HEPARINUNFRC  --   < >  --  0.13*  --  0.37 0.26*  CREATININE 1.13  --  0.95  --  0.86  --   --   < > = values in this interval not displayed. Estimated Creatinine Clearance: 79 mL/min (by C-G formula based on Cr of 0.86).   Assessment: 74 yo male with STEMI s/p cath and IABP. The IABP was removed 1/25 with plans to d/c heparin. There is concern for recurrent ischemia and Dr. Cathie Olden has consulted pharmacy to restart heparin. Heparin level is at goal on 1700 units/hr. Possible redo bypass grafting if status improves.   Confirmatory HL is SUBtherapeutic at 0.26 on heparin 1700 units/hr. No bleeding noted per nursing.  Goal of Therapy:  Heparin level= 0.3-0.5 Monitor platelets by anticoagulation protocol: Yes   Plan:  -Increase heparin to 1850 units/hr -8 hour HL -Daily heparin level and CBC -Monitor s/sx of bleeding  Andrey Cota. Diona Foley, PharmD Clinical Pharmacist Pager [phone] 05/04/2014 6:41 PM

## 2014-05-05 ENCOUNTER — Inpatient Hospital Stay (HOSPITAL_COMMUNITY): Payer: Medicare Other

## 2014-05-05 DIAGNOSIS — E872 Acidosis: Secondary | ICD-10-CM | POA: Diagnosis present

## 2014-05-05 DIAGNOSIS — J9602 Acute respiratory failure with hypercapnia: Secondary | ICD-10-CM | POA: Diagnosis present

## 2014-05-05 DIAGNOSIS — I25119 Atherosclerotic heart disease of native coronary artery with unspecified angina pectoris: Secondary | ICD-10-CM

## 2014-05-05 DIAGNOSIS — Z951 Presence of aortocoronary bypass graft: Secondary | ICD-10-CM

## 2014-05-05 LAB — GLUCOSE, CAPILLARY
GLUCOSE-CAPILLARY: 123 mg/dL — AB (ref 70–99)
GLUCOSE-CAPILLARY: 135 mg/dL — AB (ref 70–99)
Glucose-Capillary: 115 mg/dL — ABNORMAL HIGH (ref 70–99)
Glucose-Capillary: 136 mg/dL — ABNORMAL HIGH (ref 70–99)
Glucose-Capillary: 131 mg/dL — ABNORMAL HIGH (ref 70–99)
Glucose-Capillary: 101 mg/dL — ABNORMAL HIGH (ref 70–99)

## 2014-05-05 LAB — CBC
HCT: 27.6 % — ABNORMAL LOW (ref 39.0–52.0)
Hemoglobin: 9.7 g/dL — ABNORMAL LOW (ref 13.0–17.0)
MCH: 32.4 pg (ref 26.0–34.0)
MCHC: 35.1 g/dL (ref 30.0–36.0)
MCV: 92.3 fL (ref 78.0–100.0)
Platelets: 144 10*3/uL — ABNORMAL LOW (ref 150–400)
RBC: 2.99 MIL/uL — ABNORMAL LOW (ref 4.22–5.81)
RDW: 13 % (ref 11.5–15.5)
WBC: 5.4 10*3/uL (ref 4.0–10.5)

## 2014-05-05 LAB — MAGNESIUM: MAGNESIUM: 1.9 mg/dL (ref 1.5–2.5)

## 2014-05-05 LAB — BASIC METABOLIC PANEL
Anion gap: 8 (ref 5–15)
BUN: 16 mg/dL (ref 6–23)
CO2: 27 mmol/L (ref 19–32)
Calcium: 7.7 mg/dL — ABNORMAL LOW (ref 8.4–10.5)
Chloride: 99 mmol/L (ref 96–112)
Creatinine, Ser: 0.93 mg/dL (ref 0.50–1.35)
GFR calc non Af Amer: 80 mL/min — ABNORMAL LOW (ref >90)
Glucose, Bld: 131 mg/dL — ABNORMAL HIGH (ref 70–99)
Potassium: 3.4 mmol/L — ABNORMAL LOW (ref 3.5–5.1)
SODIUM: 134 mmol/L — AB (ref 135–145)

## 2014-05-05 LAB — PHOSPHORUS: PHOSPHORUS: 2.7 mg/dL (ref 2.3–4.6)

## 2014-05-05 LAB — HEPARIN LEVEL (UNFRACTIONATED): Heparin Unfractionated: 0.32 IU/mL (ref 0.30–0.70)

## 2014-05-05 MED ORDER — POTASSIUM CHLORIDE 20 MEQ/15ML (10%) PO SOLN
30.0000 meq | ORAL | Status: AC
  Administered 2014-05-05 (×3): 30 meq
  Filled 2014-05-05 (×3): qty 22.5

## 2014-05-05 MED ORDER — HEPARIN SODIUM (PORCINE) 5000 UNIT/ML IJ SOLN
5000.0000 [IU] | Freq: Three times a day (TID) | INTRAMUSCULAR | Status: DC
  Administered 2014-05-05 – 2014-05-06 (×2): 5000 [IU] via SUBCUTANEOUS
  Filled 2014-05-05 (×4): qty 1

## 2014-05-05 MED ORDER — CLOPIDOGREL BISULFATE 75 MG PO TABS
75.0000 mg | ORAL_TABLET | Freq: Every day | ORAL | Status: DC
  Administered 2014-05-05 – 2014-05-14 (×10): 75 mg via ORAL
  Filled 2014-05-05 (×10): qty 1

## 2014-05-05 MED ORDER — FUROSEMIDE 10 MG/ML IJ SOLN: 40.0000 mg | Freq: Two times a day (BID) | INTRAMUSCULAR | Status: DC

## 2014-05-05 MED ORDER — POTASSIUM CHLORIDE 20 MEQ/15ML (10%) PO SOLN
40.0000 meq | Freq: Two times a day (BID) | ORAL | Status: DC
  Administered 2014-05-05 – 2014-05-14 (×18): 40 meq via ORAL
  Filled 2014-05-05 (×22): qty 30

## 2014-05-05 MED ORDER — FUROSEMIDE 10 MG/ML IJ SOLN
40.0000 mg | Freq: Three times a day (TID) | INTRAMUSCULAR | Status: DC
  Administered 2014-05-05 – 2014-05-06 (×4): 40 mg via INTRAVENOUS
  Filled 2014-05-05 (×7): qty 4

## 2014-05-05 MED ORDER — MAGNESIUM SULFATE 2 GM/50ML IV SOLN
2.0000 g | Freq: Once | INTRAVENOUS | Status: AC
  Administered 2014-05-05: 2 g via INTRAVENOUS
  Filled 2014-05-05: qty 50

## 2014-05-05 NOTE — Progress Notes (Signed)
PULMONARY / CRITICAL CARE MEDICINE   Name: [name] MRN: [id number] DOB: 14-Jul-1940    ADMISSION DATE:  04/29/2014 CONSULTATION DATE:  05/05/2014  REFERRING MD :  Martin[place]  CHIEF COMPLAINT:  STEMI  INITIAL PRESENTATION:  74 y.o. M brought to [facility] ED 1/22 with chest pain, SOB, N/V, diaphoresis.  He was found to have bradycardia and acute inferior STEMI and was subsequently taken to the cath lab for emergent PCI.  In cath lab, had worsening cardiogenic shock and developed CHB requiring temp pacer wire as well as IABP but did have successful balloon angioplasty of LCx and distal left main.  He proceeded to have multiple episodes of VT / VF requiring defib x 13.  He was intubated and started on Levophed, Amiodarone, Lidocaine.  PCCM was consulted for vent management.  STUDIES:  EKG 1/22 >>> acute inferoposterior STEMI pCXR 1/24: small pleural effusions b/l, stable L retrocardiac atelectasis pCXR 1/25: slight improvement in left base  Lines: OETT 1/22>> NGT RIJ  SIGNIFICANT EVENTS: 1/22 - taken emergently to cath lab, unable to stent LAD due to significant calcification, LCx and distal left main balloon angioplastied, intubated 1/24: Cardiogenic shock, remains on pressors 1/25: Off pressors, episodes of Bradycardia and Hypotension, change of rhythm but back to SR, IABP removed yesterday  SUBJECTIVE: Overall even balance with lasix  VITAL SIGNS: Temp:  [98.6 F (37 C)-99.7 F (37.6 C)] 99.4 F (37.4 C) (01/27 0700) Pulse Rate:  [72-87] 79 (01/27 0851) Resp:  [16-23] 17 (01/27 0851) BP: (94-132)/(39-65) 104/45 mmHg (01/27 0851) SpO2:  [91 %-100 %] 100 % (01/27 0800) FiO2 (%):  [40 %] 40 % (01/27 0851) Weight:  [83.3 kg (183 lb 10.3 oz)] 83.3 kg (183 lb 10.3 oz) (01/27 0500) HEMODYNAMICS:   VENTILATOR SETTINGS: Vent Mode:  [-] PSV;CPAP FiO2 (%):  [40 %] 40 % Set Rate:  [16 bmp] 16 bmp Vt Set:  [600 mL] 600 mL PEEP:  [5 cmH20] 5 cmH20 Pressure Support:  [5 cmH20] 5  cmH20 Plateau Pressure:  [16 cmH20-17 cmH20] 16 cmH20 INTAKE / OUTPUT: Intake/Output      01/26 0701 - 01/27 0700 01/27 0701 - 01/28 0700   I.V. (mL/kg) 1809.2 (21.7) 51.2 (0.6)   NG/GT 1588.7 60   IV Piggyback 450    Total Intake(mL/kg) 3847.9 (46.2) 111.2 (1.3)   Urine (mL/kg/hr) 3350 (1.7) 125 (0.7)   Emesis/NG output     Total Output 3350 125   Net +497.9 -13.8          PHYSICAL EXAMINATION: General: lying in bed, on mechanical ventilation Neuro: Awake, appears irritated with moving upper ext HEENT: ETT , jvd up Cardiovascular: RRR s1 s 2 Lungs: Coarse b/l breath sounds- but reduced Abdomen: BS present, soft, no r/g Musculoskeletal: edematous extremities unchanged, moving all extremities Skin: Warm  LABS:  CBC  Recent Labs Lab 05/03/14 0318 05/04/14 0300 05/05/14 0430  WBC 6.7 6.2 5.4  HGB 10.2* 10.3* 9.7*  HCT 28.7* 28.8* 27.6*  PLT 95* 108* 144*   Coag's  Recent Labs Lab 04/29/14 2130  APTT 31  INR 1.01   BMET  Recent Labs Lab 05/03/14 0318 05/04/14 0300 05/04/14 1641 05/05/14 0430  NA 134* 134*  --  134*  K 3.2* 3.1* 3.9 3.4*  CL 101 102  --  99  CO2 25 24  --  27  BUN 10 12  --  16  CREATININE 0.95 0.86  --  0.93  GLUCOSE 109* 113*  --  131*  Electrolytes  Recent Labs Lab 04/30/14 0845  05/03/14 0318 05/04/14 0300 05/05/14 0430  CALCIUM  --   < > 7.6* 7.7* 7.7*  MG 1.8  --   --  2.1 1.9  PHOS 3.1  --   --  2.6 2.7  < > = values in this interval not displayed. Sepsis Markers  Recent Labs Lab 04/30/14 0815 04/30/14 1415 05/01/14 0043 05/01/14 0044 05/02/14 0425 05/03/14 0318  LATICACIDVEN 1.5 0.9  --  1.1  --   --   PROCALCITON  --   --  1.17  --  0.68 0.46   ABG  Recent Labs Lab 04/30/14 0230 05/03/14 0938 05/04/14 0930  PHART 7.283* 7.418 7.433  PCO2ART 44.2 33.0* 35.4  PO2ART 178.0* 95.0 63.7*   Cardiac Enzymes  Recent Labs Lab 04/30/14 0255 04/30/14 0845 04/30/14 1415  TROPONINI 9.65* 59.45* >80.00*    Glucose  Recent Labs Lab 05/04/14 1240 05/04/14 1532 05/04/14 2023 05/05/14 0037 05/05/14 0426 05/05/14 0753  GLUCAP 103* 121* 97 123* 115* 136*   Imaging Dg Chest Port 1 View  05/05/2014   CLINICAL DATA:  Acute respiratory failure  EXAM: PORTABLE CHEST - 1 VIEW  COMPARISON:  05/04/2014  FINDINGS: Cardiomediastinal silhouette is stable. Status post median sternotomy. Stable endotracheal and NG tube position. Right IJ central line is unchanged in position. Persistent mild congestion/pulmonary edema. Probable bilateral small pleural effusion with bilateral basilar atelectasis or infiltrate.  IMPRESSION: Stable support apparatus. Persistent mild congestion/pulmonary edema. Probable bilateral small pleural effusion with bilateral basilar atelectasis or infiltrate.   Electronically Signed   By: Lahoma Crocker M.D.   On: 05/05/2014 08:42   Dg Chest Port 1 View  05/04/2014   CLINICAL DATA:  74 year old male with acute respiratory failure on a ventilator.  EXAM: PORTABLE CHEST - 1 VIEW  COMPARISON:  Chest x-ray 05/03/2014.  FINDINGS: An endotracheal tube is in place with tip 5.9 cm above the carina. There is a right-sided internal jugular central venous catheter with tip terminating in the distal superior vena cava. Transcutaneous pacer/defibrillator pads project over the mid and left hemithorax. A nasogastric tube is seen extending into the stomach, however, the tip of the nasogastric tube extends below the lower margin of the image. Lung volumes appear normal. There is cephalization of the pulmonary vasculature, indistinctness of the interstitial markings, and patchy airspace disease throughout the lungs bilaterally suggestive of moderate pulmonary edema. Small to moderate bilateral pleural effusions. Mild cardiomegaly. The patient is rotated to the right on today's exam, resulting in distortion of the mediastinal contours and reduced diagnostic sensitivity and specificity for mediastinal pathology.  Atherosclerosis in the thoracic aorta.  IMPRESSION: 1. Support apparatus, as above. 2. The appearance the chest suggests underlying congestive heart failure, as above. 3. Bibasilar opacities presumably reflect a combination of atelectasis with superimposed small to moderate bilateral pleural effusions, however, underlying airspace consolidation from infection or aspiration is not excluded.   Electronically Signed   By: Vinnie Langton M.D.   On: 05/04/2014 11:34   Dg Chest Port 1 View  05/03/2014   CLINICAL DATA:  ETT advanced.  Respiratory failure  EXAM: PORTABLE CHEST - 1 VIEW  COMPARISON:  05/02/2014  FINDINGS: The endotracheal tube tip is midway between the clavicular heads and the tracheal carina. The nasogastric tube extends well into the stomach and off the inferior edge of the image. Right jugular central line extends into the SVC about 1 cm below the azygos vein junction.  No pneumothorax is evident.  No large effusions are evident. Mild basilar opacities are present, partially cleared on the left where there formerly was a more confluent opacity.  IMPRESSION: Lines and tubes as described.  Slight improvement in the left base.   Electronically Signed   By: Andreas Newport M.D.   On: 05/03/2014 16:16    ASSESSMENT / PLAN:  CARDIOVASCULAR A:  Acute inferior STEMI - s/p emergent PCI with balloon angioplasty to LCx and left main 1/22 Complete heart block - s/p temp wire placement 1/22 Cardiogenic Shock - s/p IABP placement 1/22, off pressors NOt a surgical candidate Hx HLD, PVD PAF  P:  Cardiology following CVTS note reviewed Amiodarone gtt Consider dc heparin  PULMONARY OETT 1/22 >>> A: Acute respiratory failure COPD without evidence of exacerbation High secretion concerns remain but improved P:   Weaning this am cpap 5 ps 5 , appears now with elevated rate and abdominal dyshrony evident Increase lasix, pcxr c/w edema and again about even balance BDers No role systemic  steroids  RENAL A:   Hypokalemia mild Hyponatremia mild pulm edema P:   NS @ 50--KVO today BMP in AM Replacing K Monitor I/O's +6.6L since admission Lasix increase required  GASTROINTESTINAL A:   GI prophylaxis Nutrition Hx Diverticulosis P:   SUP: Pantoprazole. TF to goal eval last BM  HEMATOLOGIC A:   VTE Prophylaxis Anemia Thrombocytopenia improving P:  SCD's Hep IV to off CBC in AM  INFECTIOUS A:   ?PNA-- tmax 102.103f this admission Procalcitonin neg, all less 2 LA 1.1 Unimpressed infection P:   vanco 1/23 >> 1/25 Zosyn 1/23 >> 1/25  Blood  cx 1/23: NGTD x2 Sputum cx 1/23>>>NF -d/c abx  Follow secretion status closely - improved pcxr c/w edema  ENDOCRINE CBG (last 3)  A:   Hyperglycemia P:   SSI if glucose consistently > 180  NEUROLOGIC A:   Acute metabolic encephalopathy Hx TIA, peripheral neuropathy P:   Prn fent RASS goal: 0  Daily WUA Holding outpatient lorazepam  Family updated: planned today  Interdisciplinary Family Meeting v Palliative Care Meeting:  Due by: 1/29   Global: wenaing - failed, lasix increase, no options for cad, left main, will discuss code status and care plan upon planned extubation with family today, TF Ccm time 30 min   Lavon Paganini. Titus Mould, MD, [place] Pgr: [facility] 05/05/2014 9:19 AM

## 2014-05-05 NOTE — Progress Notes (Addendum)
PROGRESS NOTE  Subjective:   [name] is a 74 year old gentleman with a history of coronary artery disease-status post coronary artery bypass grafting. His admitted on January 21 with an inferior posterior ST segment elevation myocardial infarction. Was compensated with complete heart block and ventricular talked tachycardia arrest. Cardiac cath revealed left dominant circulation. He he had an atretic internal mammary artery. The saphenous vein graft to the OM was occluded.  He required temporary venous pacemaker and an intra-aortic balloon pump for cardiogenic shock. At that time he was felt not to be a good candidate for redo cabbage.    Dr. Martinique was able to angioplasty  the left main and left circumflex with minimal improvement.  BP has been stable. He is off pressors.  The IABP has been pulled.  He has been seen by Dr. Servando Snare has seen him and determined that he is a poor candidate for  CABG at this time but will continue to assess.   Objective:    Vital Signs:   Temp:  [98.6 F (37 C)-99.7 F (37.6 C)] 99.4 F (37.4 C) (01/27 0700) Pulse Rate:  [72-87] 77 (01/27 0322) Resp:  [16-23] 23 (01/27 0800) BP: (94-132)/(39-65) 132/63 mmHg (01/27 0800) SpO2:  [90 %-100 %] 100 % (01/27 0800) Arterial Line BP: (128)/(66) 128/66 mmHg (01/26 0900) FiO2 (%):  [40 %] 40 % (01/27 0723) Weight:  [183 lb 10.3 oz (83.3 kg)] 183 lb 10.3 oz (83.3 kg) (01/27 0500)  Last BM Date:  (unknown)   24-hour weight change: Weight change: -2 lb 3.3 oz (-1 kg)  Weight trends: Filed Weights   05/03/14 0428 05/04/14 0308 05/05/14 0500  Weight: 181 lb 14.1 oz (82.5 kg) 185 lb 13.6 oz (84.3 kg) 183 lb 10.3 oz (83.3 kg)    Intake/Output:  01/26 0701 - 01/27 0700 In: 3847.9 [I.V.:1809.2; NG/GT:1588.7; IV Piggyback:450] Out: 3350 [Urine:3350] Total I/O In: 111.2 [I.V.:51.2; NG/GT:60] Out: 125 [Urine:125]   Physical Exam: BP 132/63 mmHg  Pulse 77  Temp(Src) 99.4 F (37.4 C) (Oral)   Resp 23  Ht 5\' 11"  (1.803 m)  Wt 183 lb 10.3 oz (83.3 kg)  BMI 25.62 kg/m2  SpO2 100%  Wt Readings from Last 3 Encounters:  05/05/14 183 lb 10.3 oz (83.3 kg)  04/14/14 172 lb (78.019 kg)  01/11/14 168 lb (76.204 kg)    General: Vital signs reviewed and noted.     Head: Normocephalic, atraumatic.  Eyes: conjunctivae/corneas clear.  EOM's intact.   Throat: intubated  Neck:  normal   Lungs:    on vent   Heart:  RR   Abdomen:  Soft, non-tender, non-distended    Extremities: No edema.  Slight echymosis on arms  , IABP has been removed.    Neurologic:  he's and on sedation. The sedation was recently stopped. He opens his eyes. He did not respond to any commands this morning. The nurse reports that he has been responding fairly normally.  Moving arms.   Psych:  unable to assess at this time.    Labs: BMET:  Recent Labs  05/04/14 0300 05/04/14 1641 05/05/14 0430  NA 134*  --  134*  K 3.1* 3.9 3.4*  CL 102  --  99  CO2 24  --  27  GLUCOSE 113*  --  131*  BUN 12  --  16  CREATININE 0.86  --  0.93  CALCIUM 7.7*  --  7.7*  MG 2.1  --  1.9  PHOS  2.6  --  2.7    Liver function tests:  Recent Labs  05/03/14 1000  BILITOT 1.1   No results for input(s): LIPASE, AMYLASE in the last 72 hours.  CBC:  Recent Labs  05/04/14 0300 05/05/14 0430  WBC 6.2 5.4  HGB 10.3* 9.7*  HCT 28.8* 27.6*  MCV 93.5 92.3  PLT 108* 144*    Cardiac Enzymes: No results for input(s): CKTOTAL, CKMB, TROPONINI in the last 72 hours.  Coagulation Studies: No results for input(s): LABPROT, INR in the last 72 hours.  Other: Invalid input(s): POCBNP No results for input(s): DDIMER in the last 72 hours. No results for input(s): HGBA1C in the last 72 hours. No results for input(s): CHOL, HDL, LDLCALC, TRIG, CHOLHDL in the last 72 hours. No results for input(s): TSH, T4TOTAL, T3FREE, THYROIDAB in the last 72 hours.  Invalid input(s): FREET3 No results for input(s): VITAMINB12, FOLATE,  FERRITIN, TIBC, IRON, RETICCTPCT in the last 72 hours.   Other results:  Tele:  NSR   Medications:    Infusions: . sodium chloride 10 mL/hr (05/04/14 1000)  . amiodarone 30 mg/hr (05/04/14 1153)  . feeding supplement (VITAL AF 1.2 CAL) 1,000 mL (05/04/14 1608)  . fentaNYL infusion INTRAVENOUS 50 mcg/hr (05/04/14 1621)  . heparin 1,850 Units/hr (05/04/14 1909)  . midazolam (VERSED) infusion 1 mg/hr (05/04/14 2200)  . norepinephrine (LEVOPHED) Adult infusion Stopped (05/02/14 2300)    Scheduled Medications: . antiseptic oral rinse  7 mL Mouth Rinse QID  . aspirin  81 mg Per Tube Daily  . atorvastatin  80 mg Per Tube q1800  . chlorhexidine  15 mL Mouth Rinse BID  . feeding supplement (PRO-STAT SUGAR FREE 64)  30 mL Oral Daily  . fentaNYL  50 mcg Intravenous Once  . insulin aspart  0-9 Units Subcutaneous 6 times per day  . ipratropium-albuterol  3 mL Nebulization Q6H  . pantoprazole sodium  40 mg Per Tube Daily  . piperacillin-tazobactam (ZOSYN)  IV  3.375 g Intravenous 3 times per day  . vancomycin  750 mg Intravenous Q12H   I have reviewed  Dr. Everrett Coombe note  Assessment/ Plan:     1.   STEMI (ST elevation myocardial infarction)  the patient was found have severe three-vessel coronary artery disease. Dr. Martinique was able to open the left main slightly and was able to open the circumflex artery slightly. He was unable to wire the left anterior descending artery because of tortuosity and an acute angle.  We will continue with current medical therapy.   He needs to be extubated as soon as PCCM advises. Continue medical therapy  for his severe CAD Our options are limited.    Will run heparin for total of 48 hours, start plavix today since he is not a surgical candidate Dc heparin this evening.    We may have to discuss Palliative care - especially     2. Cardiogenic shock:  He is currently off pressures. We placed the intra-aortic balloon pump on standby and remained quite  stable.       3. Cardiac arrest:  He seems to be doing better.    4. VT (ventricular tachycardia):  Is currently on amiodarone drip. Continue the drip now.  Can transition to PO once he is extubated.      5. CHB (complete heart block):  Improved.   6.  hypokalemia:  Continues to be an issue.   Disposition:     Continue medical therapy.  Length of Stay: 6  Thayer Headings, Brooke Bonito., MD, [facility] 05/05/2014, 8:51 AM Office [phone] Pager [phone]

## 2014-05-06 ENCOUNTER — Inpatient Hospital Stay (HOSPITAL_COMMUNITY): Payer: Medicare Other

## 2014-05-06 DIAGNOSIS — I480 Paroxysmal atrial fibrillation: Secondary | ICD-10-CM

## 2014-05-06 LAB — BLOOD GAS, ARTERIAL
Acid-Base Excess: 5.7 mmol/L — ABNORMAL HIGH (ref 0.0–2.0)
BICARBONATE: 29.2 meq/L — AB (ref 20.0–24.0)
DRAWN BY: 41881
FIO2: 0.4 %
LHR: 16 {breaths}/min
O2 Saturation: 97.1 %
PEEP: 5 cmH2O
PO2 ART: 84.4 mmHg (ref 80.0–100.0)
Patient temperature: 98.6
TCO2: 30.5 mmol/L (ref 0–100)
VT: 600 mL
pCO2 arterial: 39.6 mmHg (ref 35.0–45.0)
pH, Arterial: 7.481 — ABNORMAL HIGH (ref 7.350–7.450)

## 2014-05-06 LAB — GLUCOSE, CAPILLARY
GLUCOSE-CAPILLARY: 148 mg/dL — AB (ref 70–99)
Glucose-Capillary: 117 mg/dL — ABNORMAL HIGH (ref 70–99)
Glucose-Capillary: 121 mg/dL — ABNORMAL HIGH (ref 70–99)
Glucose-Capillary: 125 mg/dL — ABNORMAL HIGH (ref 70–99)
Glucose-Capillary: 140 mg/dL — ABNORMAL HIGH (ref 70–99)
Glucose-Capillary: 118 mg/dL — ABNORMAL HIGH (ref 70–99)

## 2014-05-06 LAB — BASIC METABOLIC PANEL
Anion gap: 7 (ref 5–15)
BUN: 22 mg/dL (ref 6–23)
CHLORIDE: 98 mmol/L (ref 96–112)
CO2: 31 mmol/L (ref 19–32)
Calcium: 8.3 mg/dL — ABNORMAL LOW (ref 8.4–10.5)
Creatinine, Ser: 1.02 mg/dL (ref 0.50–1.35)
GFR calc Af Amer: 81 mL/min — ABNORMAL LOW (ref >90)
GFR calc non Af Amer: 70 mL/min — ABNORMAL LOW (ref >90)
Glucose, Bld: 152 mg/dL — ABNORMAL HIGH (ref 70–99)
Potassium: 4 mmol/L (ref 3.5–5.1)
Sodium: 136 mmol/L (ref 135–145)

## 2014-05-06 LAB — CBC
HCT: 28.1 % — ABNORMAL LOW (ref 39.0–52.0)
Hemoglobin: 9.7 g/dL — ABNORMAL LOW (ref 13.0–17.0)
MCH: 32.1 pg (ref 26.0–34.0)
MCHC: 34.5 g/dL (ref 30.0–36.0)
MCV: 93 fL (ref 78.0–100.0)
PLATELETS: 182 10*3/uL (ref 150–400)
RBC: 3.02 MIL/uL — AB (ref 4.22–5.81)
RDW: 13.1 % (ref 11.5–15.5)
WBC: 5.6 10*3/uL (ref 4.0–10.5)

## 2014-05-06 LAB — PHOSPHORUS: PHOSPHORUS: 2.8 mg/dL (ref 2.3–4.6)

## 2014-05-06 LAB — MAGNESIUM: Magnesium: 2.1 mg/dL (ref 1.5–2.5)

## 2014-05-06 MED ORDER — FENTANYL CITRATE 0.05 MG/ML IJ SOLN: 50.0000 ug | INTRAMUSCULAR | Status: DC | PRN

## 2014-05-06 MED ORDER — ATROPINE SULFATE 0.1 MG/ML IJ SOLN: 0.5000 mg | INTRAMUSCULAR | Status: DC | PRN

## 2014-05-06 MED ORDER — POLYETHYLENE GLYCOL 3350 17 G PO PACK
17.0000 g | PACK | Freq: Two times a day (BID) | ORAL | Status: DC
  Administered 2014-05-06 – 2014-05-12 (×11): 17 g
  Filled 2014-05-06 (×19): qty 1

## 2014-05-06 MED ORDER — MIDAZOLAM HCL 2 MG/2ML IJ SOLN
5.0000 mg | INTRAMUSCULAR | Status: DC | PRN
  Administered 2014-05-06: 2 mg via INTRAVENOUS
  Filled 2014-05-06: qty 6

## 2014-05-06 MED ORDER — DEXMEDETOMIDINE HCL IN NACL 200 MCG/50ML IV SOLN
0.0000 ug/kg/h | INTRAVENOUS | Status: DC
  Administered 2014-05-06: 0.2 ug/kg/h via INTRAVENOUS
  Filled 2014-05-06: qty 50

## 2014-05-06 MED ORDER — APIXABAN 5 MG PO TABS
5.0000 mg | ORAL_TABLET | Freq: Two times a day (BID) | ORAL | Status: DC
  Administered 2014-05-06 – 2014-05-14 (×17): 5 mg via ORAL
  Filled 2014-05-06 (×19): qty 1

## 2014-05-06 MED ORDER — MIDAZOLAM HCL 2 MG/2ML IJ SOLN: 2.5000 mg | INTRAMUSCULAR | Status: DC | PRN

## 2014-05-06 MED ORDER — FUROSEMIDE 10 MG/ML IJ SOLN
40.0000 mg | Freq: Two times a day (BID) | INTRAMUSCULAR | Status: DC
  Administered 2014-05-06 – 2014-05-11 (×10): 40 mg via INTRAVENOUS
  Filled 2014-05-06 (×12): qty 4

## 2014-05-06 MED ORDER — ATROPINE SULFATE 0.1 MG/ML IJ SOLN
INTRAMUSCULAR | Status: AC
  Administered 2014-05-06: 0.5 mg via INTRAVENOUS
  Filled 2014-05-06: qty 10

## 2014-05-06 MED ORDER — ATROPINE SULFATE 0.1 MG/ML IJ SOLN
0.5000 mg | Freq: Once | INTRAMUSCULAR | Status: AC
  Administered 2014-05-06: 0.5 mg via INTRAVENOUS

## 2014-05-06 MED ORDER — MIDAZOLAM BOLUS VIA INFUSION: 5.0000 mg | INTRAVENOUS | Status: DC | PRN

## 2014-05-06 NOTE — Plan of Care (Signed)
Problem: Phase I Progression Outcomes Goal: Voiding-avoid urinary catheter unless indicated Outcome: Not Met (add Reason) Patient intubated at present time and receiving high doses of lasix.

## 2014-05-06 NOTE — Progress Notes (Signed)
PULMONARY / CRITICAL CARE MEDICINE   Name: [name] MRN: [id number] DOB: February 19, 1941    ADMISSION DATE:  04/29/2014 CONSULTATION DATE:  05/06/2014  REFERRING MD :  [place]  CHIEF COMPLAINT:  STEMI  INITIAL PRESENTATION:  74 y.o. M brought to [facility] ED 1/22 with chest pain, SOB, N/V, diaphoresis.  He was found to have bradycardia and acute inferior STEMI and was subsequently taken to the cath lab for emergent PCI.  In cath lab, had worsening cardiogenic shock and developed CHB requiring temp pacer wire as well as IABP but did have successful balloon angioplasty of LCx and distal left main.  He proceeded to have multiple episodes of VT / VF requiring defib x 13.  He was intubated and started on Levophed, Amiodarone, Lidocaine.  PCCM was consulted for vent management.  STUDIES:  EKG 1/22 >>> acute inferoposterior STEMI pCXR 1/24: small pleural effusions b/l, stable L retrocardiac atelectasis pCXR 1/25: slight improvement in left base pCXR 1/28: slight improvement in bibasilar infiltrates  Lines: OETT 1/22>> NGT RIJ  SIGNIFICANT EVENTS: 1/22 - taken emergently to cath lab, unable to stent LAD due to significant calcification, LCx and distal left main balloon angioplastied, intubated 1/24: Cardiogenic shock, remains on pressors 1/25: Off pressors, episodes of Bradycardia and Hypotension, change of rhythm but back to SR, IABP removed yesterday 1/27: Lasix, d/c heparin gtt  SUBJECTIVE: Lying in bed, on mechanical ventilation, sedated. Appears to be in atrial flutter this morning, neg 2.7 liters , pcxr better  VITAL SIGNS: Temp:  [98.2 F (36.8 C)-99.5 F (37.5 C)] 98.2 F (36.8 C) (01/28 0700) Pulse Rate:  [79] 79 (01/27 0851) Resp:  [16-23] 16 (01/28 0700) BP: (88-132)/(39-63) 118/46 mmHg (01/28 0700) SpO2:  [96 %-100 %] 96 % (01/28 0700) FiO2 (%):  [40 %] 40 % (01/28 0400) Weight:  [175 lb 11.3 oz (79.7 kg)] 175 lb 11.3 oz (79.7 kg) (01/28 0339) HEMODYNAMICS:   VENTILATOR  SETTINGS: Vent Mode:  [-] PRVC FiO2 (%):  [40 %] 40 % Set Rate:  [16 bmp] 16 bmp Vt Set:  [600 mL] 600 mL PEEP:  [5 cmH20] 5 cmH20 Pressure Support:  [5 cmH20] 5 cmH20 Plateau Pressure:  [16 cmH20-18 cmH20] 16 cmH20 INTAKE / OUTPUT: Intake/Output      01/27 0701 - 01/28 0700 01/28 0701 - 01/29 0700   I.V. (mL/kg) 1098.7 (13.8)    NG/GT 1440    IV Piggyback 50    Total Intake(mL/kg) 2588.7 (32.5)    Urine (mL/kg/hr) 5475 (2.9)    Total Output 5475     Net -2886.3            PHYSICAL EXAMINATION: General: lying in bed, on mechanical ventilation Neuro: sleepy, but opens eyes to voice, following commands HEENT: ETT  Cardiovascular: irregular Lungs: Coarse b/l breath sounds, improved Abdomen: BS present, soft, non-tender Musculoskeletal: moving all extremities Skin: Warm  LABS:  CBC  Recent Labs Lab 05/04/14 0300 05/05/14 0430 05/06/14 0420  WBC 6.2 5.4 5.6  HGB 10.3* 9.7* 9.7*  HCT 28.8* 27.6* 28.1*  PLT 108* 144* 182   Coag's  Recent Labs Lab 04/29/14 2130  APTT 31  INR 1.01   BMET  Recent Labs Lab 05/04/14 0300 05/04/14 1641 05/05/14 0430 05/06/14 0420  NA 134*  --  134* 136  K 3.1* 3.9 3.4* 4.0  CL 102  --  99 98  CO2 24  --  27 31  BUN 12  --  16 22  CREATININE 0.86  --  0.93 1.02  GLUCOSE 113*  --  131* 152*   Electrolytes  Recent Labs Lab 05/04/14 0300 05/05/14 0430 05/06/14 0420  CALCIUM 7.7* 7.7* 8.3*  MG 2.1 1.9 2.1  PHOS 2.6 2.7 2.8   Sepsis Markers  Recent Labs Lab 04/30/14 0815 04/30/14 1415 05/01/14 0043 05/01/14 0044 05/02/14 0425 05/03/14 0318  LATICACIDVEN 1.5 0.9  --  1.1  --   --   PROCALCITON  --   --  1.17  --  0.68 0.46   ABG  Recent Labs Lab 05/03/14 0938 05/04/14 0930 05/06/14 0433  PHART 7.418 7.433 7.481*  PCO2ART 33.0* 35.4 39.6  PO2ART 95.0 63.7* 84.4   Cardiac Enzymes  Recent Labs Lab 04/30/14 0255 04/30/14 0845 04/30/14 1415  TROPONINI 9.65* 59.45* >80.00*   Glucose  Recent  Labs Lab 05/05/14 1153 05/05/14 1600 05/05/14 1935 05/05/14 2325 05/06/14 0335 05/06/14 0726  GLUCAP 135* 131* 101* 117* 121* 125*   Imaging Dg Chest Port 1 View  05/06/2014   CLINICAL DATA:  Respiratory acidosis.  EXAM: PORTABLE CHEST - 1 VIEW  COMPARISON:  05/05/2014.  FINDINGS: Endotracheal tube, NG tube, right IJ line stable position. Prior CABG. Heart size stable. Bibasilar pulmonary infiltrates have improved slightly. No prominent pleural effusion. No pneumothorax. No acute bony abnormality.  IMPRESSION: 1. Lines and tubes in stable position. 2. Interim slight improvement of bibasilar pulmonary alveolar infiltrates consistent with partial clearing of pulmonary edema and/or pneumonia. 3. Prior CABG.  Heart size stable.   Electronically Signed   By: Marcello Moores  Register   On: 05/06/2014 07:48   Dg Chest Port 1 View  05/05/2014   CLINICAL DATA:  Acute respiratory failure  EXAM: PORTABLE CHEST - 1 VIEW  COMPARISON:  05/04/2014  FINDINGS: Cardiomediastinal silhouette is stable. Status post median sternotomy. Stable endotracheal and NG tube position. Right IJ central line is unchanged in position. Persistent mild congestion/pulmonary edema. Probable bilateral small pleural effusion with bilateral basilar atelectasis or infiltrate.  IMPRESSION: Stable support apparatus. Persistent mild congestion/pulmonary edema. Probable bilateral small pleural effusion with bilateral basilar atelectasis or infiltrate.   Electronically Signed   By: Lahoma Crocker M.D.   On: 05/05/2014 08:42   Dg Chest Port 1 View  05/04/2014   CLINICAL DATA:  75 year old male with acute respiratory failure on a ventilator.  EXAM: PORTABLE CHEST - 1 VIEW  COMPARISON:  Chest x-ray 05/03/2014.  FINDINGS: An endotracheal tube is in place with tip 5.9 cm above the carina. There is a right-sided internal jugular central venous catheter with tip terminating in the distal superior vena cava. Transcutaneous pacer/defibrillator pads project over the  mid and left hemithorax. A nasogastric tube is seen extending into the stomach, however, the tip of the nasogastric tube extends below the lower margin of the image. Lung volumes appear normal. There is cephalization of the pulmonary vasculature, indistinctness of the interstitial markings, and patchy airspace disease throughout the lungs bilaterally suggestive of moderate pulmonary edema. Small to moderate bilateral pleural effusions. Mild cardiomegaly. The patient is rotated to the right on today's exam, resulting in distortion of the mediastinal contours and reduced diagnostic sensitivity and specificity for mediastinal pathology. Atherosclerosis in the thoracic aorta.  IMPRESSION: 1. Support apparatus, as above. 2. The appearance the chest suggests underlying congestive heart failure, as above. 3. Bibasilar opacities presumably reflect a combination of atelectasis with superimposed small to moderate bilateral pleural effusions, however, underlying airspace consolidation from infection or aspiration is not excluded.   Electronically Signed   By: Quillian Quince  Entrikin M.D.   On: 05/04/2014 11:34    ASSESSMENT / PLAN:  CARDIOVASCULAR A:  Acute inferior STEMI - s/p emergent PCI with balloon angioplasty to LCx and left main 1/22 Complete heart block - s/p temp wire placement 1/22 Cardiogenic Shock - s/p IABP placement 1/22, off pressors NOt a surgical candidate Hx HLD, PVD PAF, appears to be in flutter this AM on monitor  P:  Cardiology following Amiodarone gtt-->transition to po when extubated Plavix started cvp for volume status  PULMONARY OETT 1/22 >>> A: Acute respiratory failure COPD without evidence of exacerbation Secretions slightly improved  P:   Weaning again this am, cpa 5ps 5, goal 1 hr, failed at end, no ABg needed Continue lasix, pcxr better and with cardiac status need to lower volume for success Will d/w son dni? pcxr in am  cvp BDers  RENAL A:    Hypokalemia--resolved Hyponatremia--resolved pulm edema--improving P:   BMP in AM Monitor I/O's +4.2L since admission Lasix to continue, but lower  GASTROINTESTINAL A:   GI prophylaxis Nutrition Hx Diverticulosis P:   SUP: Pantoprazole. TF to goal Still no BM miralax  HEMATOLOGIC A:   VTE Prophylaxis Anemia Thrombocytopenia--resolved P:  SCD's CBC in AM with further hemoconcentration Off heparin gtt HSQ  INFECTIOUS A:   ?PNA-- tmax 102.45f this admission Procalcitonin neg, all less 2 LA 1.1 Unimpressed infection P:   vanco 1/23 >> 1/27 Zosyn 1/23 >> 1/27  Blood  cx 1/23: NGTD x2 Sputum cx 1/23>>>NF  No fevers,pcxr resolving with lasix  ENDOCRINE CBG (last 3)  A:   Hyperglycemia P:   SSI if glucose consistently > 180  NEUROLOGIC A:   Acute metabolic encephalopathy Agitation increased Hx TIA, peripheral neuropathy P:   Prn fent RASS goal: 0  Daily WUA Holding outpatient lorazepam Attempt low dose precedex  Family updated: planned for today  Interdisciplinary Family Meeting v Palliative Care Meeting:  Due by: 1/29  Signed: Jerene Pitch, MD PGY-3, Internal Medicine Resident Pager: [phone]  05/06/2014,8:11 AM   STAFF NOTE: Linwood Dibbles, MD FACP have personally reviewed patient's available data, including medical history, events of note, physical examination and test results as part of my evaluation. I have discussed with resident/NP and other care providers such as pharmacist, RN and RRT. In addition, I personally evaluated patient and elicited key findings of: neg balance noted, tolerated well, reduce, weaning but failed, will d/w family dni upon extubation planned, precedex required, ronchi reduced, agitation The patient is critically ill with multiple organ systems failure and requires high complexity decision making for assessment and support, frequent evaluation and titration of therapies, application of advanced monitoring  technologies and extensive interpretation of multiple databases.   Critical Care Time devoted to patient care services described in this note is30 Minutes. This time reflects time of care of this signee: Merrie Roof, MD FACP. This critical care time does not reflect procedure time, or teaching time or supervisory time of PA/NP/Med student/Med Resident etc but could involve care discussion time. Rest per NP/medical resident whose note is outlined above and that I agree with   Lavon Paganini. Titus Mould, MD, [place] Pgr: [place] 05/06/2014 9:37 AM

## 2014-05-06 NOTE — Significant Event (Signed)
Called by RN due to bradycardia in 20s bpm, informed pt was DNR per earlier conversation with PCCM.  ECG requested.  Upon arriving to bedside, tele and ECG revealed CHB with ventricular escape in the 20s.  Given note available regarding code status which states medical therapy will be continued, 1/2 atropine ordered followed by a second dose after a couple of minutes with resolution of CHB.  PRN atropine ordered, family updated.  Jordon Bourquin 05/06/2014 11:43 PM

## 2014-05-06 NOTE — Progress Notes (Signed)
Pt HR began to drop from 80s down to 50s then as low and sustained in 30s. Precedex gtt cut in half and then stopped, as well as Amiodarone gtt. BP dropped to systolic of 46K. 0.5mg  Atropine given. CCM MD and Cardiology paged and made aware. New orders received to discontinue Precedex, ok to leave off Amiodarone for now (unless pt becomes tachycardic), restart fentanyl gtt for sedation.

## 2014-05-06 NOTE — Progress Notes (Signed)
PROGRESS NOTE  Subjective:   Mr. Hardge is a 74 year old gentleman with a history of coronary artery disease-status post coronary artery bypass grafting. His admitted on January 21 with an inferior posterior ST segment elevation myocardial infarction. Was compensated with complete heart block and ventricular talked tachycardia arrest. Cardiac cath revealed left dominant circulation. He he had an atretic internal mammary artery. The saphenous vein graft to the OM was occluded.  He required temporary venous pacemaker and an intra-aortic balloon pump for cardiogenic shock. At that time he was felt not to be a good candidate for redo cabbage.    Dr. Martinique was able to angioplasty  the left main and left circumflex with minimal improvement.  BP has been stable. He is off pressors.  The IABP has been pulled.  He has been seen by Dr. Servando Snare and was thought to be a poor candidate for CABG at this time.  He has continued to have episodes of paroxysmal atrial fib.   Objective:    Vital Signs:   Temp:  [98.2 F (36.8 C)-99.5 F (37.5 C)] 98.2 F (36.8 C) (01/28 0700) Pulse Rate:  [79] 79 (01/27 0851) Resp:  [16-22] 16 (01/28 0700) BP: (88-126)/(39-58) 118/46 mmHg (01/28 0700) SpO2:  [96 %-99 %] 96 % (01/28 0700) FiO2 (%):  [40 %] 40 % (01/28 0400) Weight:  [175 lb 11.3 oz (79.7 kg)] 175 lb 11.3 oz (79.7 kg) (01/28 0339)  Last BM Date:  (unknown)   24-hour weight change: Weight change: -7 lb 15 oz (-3.6 kg)  Weight trends: Filed Weights   05/04/14 0308 05/05/14 0500 05/06/14 0339  Weight: 185 lb 13.6 oz (84.3 kg) 183 lb 10.3 oz (83.3 kg) 175 lb 11.3 oz (79.7 kg)    Intake/Output:  01/27 0701 - 01/28 0700 In: 2588.7 [I.V.:1098.7; NG/GT:1440; IV Piggyback:50] Out: 7619 [Urine:5475]     Physical Exam: BP 118/46 mmHg  Pulse 79  Temp(Src) 98.2 F (36.8 C) (Oral)  Resp 16  Ht 5\' 11"  (1.803 m)  Wt 175 lb 11.3 oz (79.7 kg)  BMI 24.52 kg/m2  SpO2 96%  Wt Readings  from Last 3 Encounters:  05/06/14 175 lb 11.3 oz (79.7 kg)  04/14/14 172 lb (78.019 kg)  01/11/14 168 lb (76.204 kg)    General: Vital signs reviewed and noted.     Head: Normocephalic, atraumatic.  Eyes: conjunctivae/corneas clear.  EOM's intact.   Throat: intubated  Neck:  normal   Lungs:    on vent   Heart:  RR   Abdomen:  Soft, non-tender, non-distended    Extremities: No edema.  Slight echymosis on arms  ,   Neurologic:  he's  on sedation. He opens his eyes. He did not respond to any commands this morning.   Psych:  unable to assess at this time.    Labs: BMET:  Recent Labs  05/05/14 0430 05/06/14 0420  NA 134* 136  K 3.4* 4.0  CL 99 98  CO2 27 31  GLUCOSE 131* 152*  BUN 16 22  CREATININE 0.93 1.02  CALCIUM 7.7* 8.3*  MG 1.9 2.1  PHOS 2.7 2.8    Liver function tests:  Recent Labs  05/03/14 1000  BILITOT 1.1   No results for input(s): LIPASE, AMYLASE in the last 72 hours.  CBC:  Recent Labs  05/05/14 0430 05/06/14 0420  WBC 5.4 5.6  HGB 9.7* 9.7*  HCT 27.6* 28.1*  MCV 92.3 93.0  PLT 144* 182  Cardiac Enzymes: No results for input(s): CKTOTAL, CKMB, TROPONINI in the last 72 hours.  Coagulation Studies: No results for input(s): LABPROT, INR in the last 72 hours.  Other: Invalid input(s): POCBNP No results for input(s): DDIMER in the last 72 hours. No results for input(s): HGBA1C in the last 72 hours. No results for input(s): CHOL, HDL, LDLCALC, TRIG, CHOLHDL in the last 72 hours. No results for input(s): TSH, T4TOTAL, T3FREE, THYROIDAB in the last 72 hours.  Invalid input(s): FREET3 No results for input(s): VITAMINB12, FOLATE, FERRITIN, TIBC, IRON, RETICCTPCT in the last 72 hours.   Other results:  Tele:  NSR currently.  Personal review of the tele review shows paroxysmal atrial fib.   Medications:    Infusions: . sodium chloride 10 mL/hr (05/04/14 1000)  . amiodarone 30 mg/hr (05/06/14 0111)  . feeding supplement (VITAL AF  1.2 CAL) 1,000 mL (05/06/14 0233)  . fentaNYL infusion INTRAVENOUS 50 mcg/hr (05/04/14 1621)  . midazolam (VERSED) infusion 1 mg/hr (05/05/14 1259)  . norepinephrine (LEVOPHED) Adult infusion Stopped (05/02/14 2300)    Scheduled Medications: . antiseptic oral rinse  7 mL Mouth Rinse QID  . aspirin  81 mg Per Tube Daily  . atorvastatin  80 mg Per Tube q1800  . chlorhexidine  15 mL Mouth Rinse BID  . clopidogrel  75 mg Oral Daily  . feeding supplement (PRO-STAT SUGAR FREE 64)  30 mL Oral Daily  . fentaNYL  50 mcg Intravenous Once  . furosemide  40 mg Intravenous 3 times per day  . heparin subcutaneous  5,000 Units Subcutaneous 3 times per day  . insulin aspart  0-9 Units Subcutaneous 6 times per day  . ipratropium-albuterol  3 mL Nebulization Q6H  . pantoprazole sodium  40 mg Per Tube Daily  . potassium chloride  40 mEq Oral BID   I have reviewed  Dr. Everrett Coombe note  Assessment/ Plan:     1.   STEMI (ST elevation myocardial infarction)  the patient was found have severe three-vessel coronary artery disease. Dr. Martinique was able to open the left main slightly and was able to open the circumflex artery slightly. He was unable to wire the left anterior descending artery because of tortuosity and an acute angle.  We will continue with current medical therapy.   He needs to be extubated as soon as PCCM advises. Continue medical therapy  for his severe CAD Our PCI options are limited.   We may have to discuss Palliative care - especially if he does not make progress    2. Cardiogenic shock:  He is currently off pressures. He remains stable    3. Cardiac arrest:  He seems to be doing better.    4. VT (ventricular tachycardia):  Is currently on amiodarone drip. Continue the drip now.  Can transition to PO once he is extubated.      5. CHB (complete heart block):  Improved.   6.  hypokalemia:  Continues to be an issue. 7. Paroxysmal atrial fib.: will need to start Eliquis.  Will DC ASA  since he is on plavix  CHADS 2 VASC of 6 ( age of 10, hx of CVA, HTN, DM)   Disposition:     Continue medical therapy.   Length of Stay: 7  Thayer Headings, Brooke Bonito., MD, [facility] 05/06/2014, 8:16 AM Office [phone] Pager [phone]

## 2014-05-06 NOTE — Procedures (Signed)
I have had extensive discussions with family son speaking for other son and wife. We discussed patients current circumstances and organ failures. We also discussed patient's prior wishes under circumstances such as this. Family has decided to NOT perform resuscitation if arrest but to continue current medical support for now. IF when extubate planned and fails - comfort , if success then cvts can continue consideration bypass  Lavon Paganini. Titus Mould, MD, [place] Pgr: [place] [facility]

## 2014-05-06 NOTE — Progress Notes (Signed)
Versed 19mL and Fentanyl 27mL wasted in sink, witnessed x2 RN (Delbert Phenix, RN and Ames Coupe, RN)

## 2014-05-07 ENCOUNTER — Inpatient Hospital Stay (HOSPITAL_COMMUNITY): Payer: Medicare Other

## 2014-05-07 ENCOUNTER — Telehealth: Payer: Self-pay | Admitting: Internal Medicine

## 2014-05-07 LAB — BASIC METABOLIC PANEL
ANION GAP: 4 — AB (ref 5–15)
BUN: 31 mg/dL — ABNORMAL HIGH (ref 6–23)
CO2: 36 mmol/L — ABNORMAL HIGH (ref 19–32)
Calcium: 8.8 mg/dL (ref 8.4–10.5)
Chloride: 98 mmol/L (ref 96–112)
Creatinine, Ser: 1.03 mg/dL (ref 0.50–1.35)
GFR, EST AFRICAN AMERICAN: 80 mL/min — AB (ref >90)
GFR, EST NON AFRICAN AMERICAN: 69 mL/min — AB (ref >90)
GLUCOSE: 124 mg/dL — AB (ref 70–99)
POTASSIUM: 4.4 mmol/L (ref 3.5–5.1)
Sodium: 138 mmol/L (ref 135–145)

## 2014-05-07 LAB — CBC
HCT: 29.4 % — ABNORMAL LOW (ref 39.0–52.0)
HEMOGLOBIN: 10.1 g/dL — AB (ref 13.0–17.0)
MCH: 32.7 pg (ref 26.0–34.0)
MCHC: 34.4 g/dL (ref 30.0–36.0)
MCV: 95.1 fL (ref 78.0–100.0)
PLATELETS: 235 10*3/uL (ref 150–400)
RBC: 3.09 MIL/uL — ABNORMAL LOW (ref 4.22–5.81)
RDW: 13.3 % (ref 11.5–15.5)
WBC: 6.2 10*3/uL (ref 4.0–10.5)

## 2014-05-07 LAB — CULTURE, BLOOD (ROUTINE X 2)
CULTURE: NO GROWTH
Culture: NO GROWTH

## 2014-05-07 LAB — GLUCOSE, CAPILLARY
GLUCOSE-CAPILLARY: 111 mg/dL — AB (ref 70–99)
Glucose-Capillary: 128 mg/dL — ABNORMAL HIGH (ref 70–99)
Glucose-Capillary: 141 mg/dL — ABNORMAL HIGH (ref 70–99)
Glucose-Capillary: 135 mg/dL — ABNORMAL HIGH (ref 70–99)

## 2014-05-07 MED ORDER — MIDAZOLAM HCL 2 MG/2ML IJ SOLN
1.0000 mg | INTRAMUSCULAR | Status: DC | PRN
Start: 2014-05-07 — End: 2014-05-07
  Administered 2014-05-07: 1 mg via INTRAVENOUS
  Administered 2014-05-07: 2 mg via INTRAVENOUS
  Filled 2014-05-07 (×2): qty 2

## 2014-05-07 MED ORDER — MIDAZOLAM HCL 2 MG/2ML IJ SOLN: 2.0000 mg | INTRAMUSCULAR | Status: DC | PRN

## 2014-05-07 MED ORDER — MORPHINE SULFATE 25 MG/ML IV SOLN
4.0000 mg/h | INTRAVENOUS | Status: DC
  Administered 2014-05-07: 4 mg/h via INTRAVENOUS
  Filled 2014-05-07: qty 10

## 2014-05-07 MED ORDER — MORPHINE SULFATE 4 MG/ML IJ SOLN
4.0000 mg | INTRAMUSCULAR | Status: DC | PRN
  Administered 2014-05-07 – 2014-05-08 (×4): 4 mg via INTRAVENOUS
  Filled 2014-05-07 (×4): qty 1

## 2014-05-07 MED ORDER — SODIUM CHLORIDE 0.9 % IV SOLN
25.0000 ug/h | INTRAVENOUS | Status: DC
  Filled 2014-05-07: qty 50

## 2014-05-07 MED ORDER — FENTANYL BOLUS VIA INFUSION
25.0000 ug | INTRAVENOUS | Status: DC | PRN
  Filled 2014-05-07: qty 25

## 2014-05-07 MED ORDER — MORPHINE SULFATE 0.5 MG/ML IJ SOLN: 4.0000 mg | INTRAMUSCULAR | Status: DC | PRN

## 2014-05-07 NOTE — Progress Notes (Addendum)
NUTRITION FOLLOW UP  Intervention:    Nutrition intervention as appropriate.   Nutrition Dx:   Inadequate oral intake related to inability to eat as evidenced by NPO status.  Ongoing  Goal:   Pt to meet >/= 90% of estimated nutrition needs; unmet.   Monitor:   TF tolerance/adequacy, weight trend, labs, vent status.  Assessment:   Pt admitted with chest pain, SOB, n/v.  Was found to have bradycardia and acute inferior STEMI and was taken to cath lab.  Had worsening cardiogenic shock requiring temp pacer wire and IABP, pt intubated.  Hx of CAD, CABG 1993, PVD, TIA, peripheral neuropathy.  NG tube placed, TF not initally started due to NG output  1/22: 700 1/23:1650 1/24: 900  Patient is currently intubated on ventilator support MV: 8 L/min Temp (24hrs), Avg:98.1 F (36.7 C), Min:97.3 F (36.3 C), Max:99 F (37.2 C)  Magnesium and Phosphorus WNL TF started 1/25.  Pt worsened overnight and now family considering comfort.  Pt discussed during ICU rounds and with RN.  Per MD plan to stop feedings and extubate today if pt does not do well plan for comfort.   Height: Ht Readings from Last 1 Encounters:  04/30/14 5\' 11"  (1.803 m)    Weight Status:   Wt Readings from Last 1 Encounters:  05/07/14 173 lb 4.5 oz (78.6 kg)  Admission Weight: 171lbs   Re-estimated needs:  Kcal: 1750-1900 Protein: 110-125 gm protein Fluid: 2 L  Skin: ecchymosis on arms  Diet Order: Diet NPO time specified   Intake/Output Summary (Last 24 hours) at 05/07/14 1131 Last data filed at 05/07/14 1000  Gross per 24 hour  Intake 1913.09 ml  Output   2445 ml  Net -531.91 ml    Last BM: none documented, MD ordered miralax and dulcolax     Labs:   Recent Labs Lab 05/04/14 0300  05/05/14 0430 05/06/14 0420 05/07/14 0400  NA 134*  --  134* 136 138  K 3.1*  < > 3.4* 4.0 4.4  CL 102  --  99 98 98  CO2 24  --  27 31 36*  BUN 12  --  16 22 31*  CREATININE 0.86  --  0.93 1.02 1.03   CALCIUM 7.7*  --  7.7* 8.3* 8.8  MG 2.1  --  1.9 2.1  --   PHOS 2.6  --  2.7 2.8  --   GLUCOSE 113*  --  131* 152* 124*  < > = values in this interval not displayed.  CBG (last 3)   Recent Labs  05/06/14 1942 05/07/14 0339 05/07/14 0812  GLUCAP 118* 128* 141*    Scheduled Meds: . antiseptic oral rinse  7 mL Mouth Rinse QID  . apixaban  5 mg Oral BID  . atorvastatin  80 mg Per Tube q1800  . chlorhexidine  15 mL Mouth Rinse BID  . clopidogrel  75 mg Oral Daily  . feeding supplement (PRO-STAT SUGAR FREE 64)  30 mL Oral Daily  . furosemide  40 mg Intravenous Q12H  . insulin aspart  0-9 Units Subcutaneous 6 times per day  . ipratropium-albuterol  3 mL Nebulization Q6H  . pantoprazole sodium  40 mg Per Tube Daily  . polyethylene glycol  17 g Per Tube BID  . potassium chloride  40 mEq Oral BID    Continuous Infusions: . sodium chloride 5 mL/hr at 05/06/14 1237  . amiodarone Stopped (05/06/14 1215)  . feeding supplement (VITAL AF 1.2 CAL)  1,000 mL (05/06/14 2252)  . fentaNYL infusion INTRAVENOUS 150 mcg/hr (05/07/14 1000)    [place], [place], [place] Pager [phone] After Hours Pager

## 2014-05-07 NOTE — Progress Notes (Signed)
05/07/2014-Respiratory care note- Pt suctioned and extubated at 1450 as per MD order.  RN and MD at bedside for procedure.  Family to bedside post extubation.  Pt placed on 4lpm cannula with sats of 98%.  Pt tolerated procedure well with pt vocalizing post extubation.  Ventilator removed from room.  s Jamacia Jester rrt, rcp

## 2014-05-07 NOTE — Progress Notes (Signed)
Patient became bradycardiac at 2256. Heart rate was in the 20's for prolong period of time. CCM Dr. Melvyn Novas made aware at 2300. Cardiology fellow Dr. Philbert Riser paged and made aware of changes at 2305. Patient's family Roland Earl (pt's wife) and Azir (pt's son) called and notified of patient's changes. Dr. Philbert Riser at bedside at 2315. Assessed patient and the 12 lead ekg. Verbal order to given 1/2 amp of atropine given. Then a few minutes later additional 1/2 amp of atropine given. Patient did not respond to the first amp, yet his heart rate increased after the second amp given. Patient's family arrived at bedside at 2345. They were updated on current condition and medications given.

## 2014-05-07 NOTE — Progress Notes (Signed)
Patient ID: [name], male   DOB: September 10, 1940, 74 y.o.   MRN: [id number]  Family and pt decided for dc vent, comfort if fails Lavon Paganini. Titus Mould, MD, [place] Pgr: [place] [facility]

## 2014-05-07 NOTE — Progress Notes (Signed)
Patient has increase agitation. Reaching for ET tube with mitten hands. Redirected several times. Dr. Melvyn Novas notified of agitation. Orders received to given patient 5 mg IV versed every two hours. Due to patient's blood pressure, this nurse only gave 2 mg IV versed. 1 mg of Versed was wasted prior to and witnessed by [employer] .

## 2014-05-07 NOTE — Progress Notes (Signed)
Chaplain responded to page from pt nurse for support of family and pt transitioning to comfort care. Chaplain offered prayer, offered hospitality, facilitated storytelling and offered emotional support. Pt's two sons and pt wife at bedside. Third son will not be coming this evening. Pt family seems to be at peace with decision, and believe they are respecting pt wishes. Chaplain informed pt of her services should family need it. Page chaplain as needed.    05/07/14 0000  Clinical Encounter Type  Visited With Patient and family together  Visit Type Follow-up;Spiritual support  Referral From Nurse  Consult/Referral To Chaplain  Spiritual Encounters  Spiritual Needs Grief support;Emotional;Prayer  Stress Factors  Family Stress Factors Loss  Obediah Welles, Barbette Hair, Chaplain 05/07/2014 12:39 AM

## 2014-05-07 NOTE — Progress Notes (Signed)
eLink Physician-Brief Progress Note Patient Name: [name] DOB: [birth year] MRN: [id number]   Date of Service  05/07/2014  HPI/Events of Note  Spoke with pt's son, Gerald Stabs.  Updated about pt's current status.  He remains in shock, on vent, and intermittent bradycardia with complete heart block.  Explained that main issues is related to severe CAD >> not surgical candidate. The family does not want to see him suffering anymore, and want to shift focus of care to make sure he is comfortable.  eICU Interventions  Will continue current therapies, including ventilator for now.  Will not escalate care if his condition gets worse.  DNR status confirmed.  Will have bedside team f/u with family in AM, and discuss goals of care further.     Intervention Category Major Interventions: Other:  Jemel Ono 05/07/2014, 12:15 AM

## 2014-05-07 NOTE — Progress Notes (Cosign Needed)
Morphine 230cc IV wasted in sink from infusion bag with second RN witness. MD order to discontinue Morphine gtt due to patient's improving status.

## 2014-05-07 NOTE — Progress Notes (Signed)
Chaplain visited and had prayer with pt and family at bedside as per family request.  Chaplain also provided emotional and spiritual support.  Chaplain contacted pastor of family's local church ([employer]).  [name] is Rev. Barry Dienes.  Chaplain will follow up as needed.    05/07/14 1400  Clinical Encounter Type  Visited With Patient and family together  Visit Type Initial;Spiritual support;Critical Care  Referral From Family;Nurse  Consult/Referral To Faith community  Spiritual Encounters  Spiritual Needs Prayer;Emotional;Grief support  Stress Factors  Patient Stress Factors Health changes  Family Stress Factors Family relationships;Health changes   [name] 05/07/2014 2:50 PM

## 2014-05-07 NOTE — Progress Notes (Signed)
MD at bedside at 1130 to discuss care options with pt and son Gerald Stabs. Plan is to provide comfort to patient in the form of Morphine gtt (instead of Fentanyl) and prn Versed IV pushes, and to extubate patient when all of the patient's family arrives. Tube feeding discontinued per MD order. Patient's Fentanyl gtt was discontinued and Morphine gtt was initiated at 4mg /hr per MD order. Will give Versed pushes as needed for agitation. Will page MD or Warren Lacy once third son arrives from [place] to discuss extubation plans.

## 2014-05-07 NOTE — Progress Notes (Signed)
PULMONARY / CRITICAL CARE MEDICINE   Name: [name] MRN: [id number] DOB: [birth year]    ADMISSION DATE:  04/29/2014 CONSULTATION DATE:  05/07/2014  REFERRING MD :  Martinique  CHIEF COMPLAINT:  STEMI  INITIAL PRESENTATION:  74 y.o. M brought to [facility] ED 1/22 with chest pain, SOB, N/V, diaphoresis.  He was found to have bradycardia and acute inferior STEMI and was subsequently taken to the cath lab for emergent PCI.  In cath lab, had worsening cardiogenic shock and developed CHB requiring temp pacer wire as well as IABP but did have successful balloon angioplasty of LCx and distal left main.  He proceeded to have multiple episodes of VT / VF requiring defib x 13.  He was intubated and started on Levophed, Amiodarone, Lidocaine.  PCCM was consulted for vent management.  STUDIES:  EKG 1/22 >>> acute inferoposterior STEMI pCXR 1/24: small pleural effusions b/l, stable L retrocardiac atelectasis pCXR 1/25: slight improvement in left base pCXR 1/28: slight improvement in bibasilar infiltrates  Lines: OETT 1/22>> NGT RIJ  SIGNIFICANT EVENTS: 1/22 - taken emergently to cath lab, unable to stent LAD due to significant calcification, LCx and distal left main balloon angioplastied, intubated 1/24: Cardiogenic shock, remains on pressors 1/25: Off pressors, episodes of Bradycardia and Hypotension, change of rhythm but back to SR, IABP removed yesterday 1/27: Lasix, d/c heparin gtt 1/28: Intermittent bradycardia, CHB at night, improved with atropine. Transitioned to more comfort measures. Amiodarone and precedex held.   SUBJECTIVE: Lying in bed, on mechanical ventilation, awake.  Appears agitated and pointing to ETT. Hypotensive.   VITAL SIGNS: Temp:  [97.3 F (36.3 C)-99 F (37.2 C)] 97.6 F (36.4 C) (01/29 0400) Pulse Rate:  [39-88] 81 (01/29 0400) Resp:  [12-27] 12 (01/29 0600) BP: (71-154)/(30-96) 90/48 mmHg (01/29 0600) SpO2:  [88 %-100 %] 99 % (01/29 0600) FiO2 (%):  [40 %-60 %]  50 % (01/29 0400) Weight:  [173 lb 4.5 oz (78.6 kg)] 173 lb 4.5 oz (78.6 kg) (01/29 0500) HEMODYNAMICS: CVP:  [6 mmHg-8 mmHg] 8 mmHg VENTILATOR SETTINGS: Vent Mode:  [-] PRVC FiO2 (%):  [40 %-60 %] 50 % Set Rate:  [12 bmp] 12 bmp Vt Set:  [600 mL] 600 mL PEEP:  [5 cmH20-10 cmH20] 8 cmH20 Pressure Support:  [5 cmH20] 5 cmH20 Plateau Pressure:  [17 cmH20-24 cmH20] 18 cmH20 INTAKE / OUTPUT: Intake/Output      01/28 0701 - 01/29 0700 01/29 0701 - 01/30 0700   I.V. (mL/kg) 407.4 (5.2)    NG/GT 2005    IV Piggyback     Total Intake(mL/kg) 2412.4 (30.7)    Urine (mL/kg/hr) 3870 (2.1)    Total Output 3870     Net -1457.6            PHYSICAL EXAMINATION: General: lying in bed, on mechanical ventilation Neuro: awake, following commands HEENT: ETT in place, EOMI Cardiovascular: irregular s1 s 2 Lungs: Coarse b/l breath sounds Abdomen: BS present, soft but slightly distended compared to yesterday Musculoskeletal: moving all extremities Skin: Warm  LABS:  CBC  Recent Labs Lab 05/05/14 0430 05/06/14 0420 05/07/14 0400  WBC 5.4 5.6 6.2  HGB 9.7* 9.7* 10.1*  HCT 27.6* 28.1* 29.4*  PLT 144* 182 235   BMET  Recent Labs Lab 05/05/14 0430 05/06/14 0420 05/07/14 0400  NA 134* 136 138  K 3.4* 4.0 4.4  CL 99 98 98  CO2 27 31 36*  BUN 16 22 31*  CREATININE 0.93 1.02 1.03  GLUCOSE 131*  152* 124*   Electrolytes  Recent Labs Lab 05/04/14 0300 05/05/14 0430 05/06/14 0420 05/07/14 0400  CALCIUM 7.7* 7.7* 8.3* 8.8  MG 2.1 1.9 2.1  --   PHOS 2.6 2.7 2.8  --    Sepsis Markers  Recent Labs Lab 04/30/14 0815 04/30/14 1415 05/01/14 0043 05/01/14 0044 05/02/14 0425 05/03/14 0318  LATICACIDVEN 1.5 0.9  --  1.1  --   --   PROCALCITON  --   --  1.17  --  0.68 0.46   ABG  Recent Labs Lab 05/03/14 0938 05/04/14 0930 05/06/14 0433  PHART 7.418 7.433 7.481*  PCO2ART 33.0* 35.4 39.6  PO2ART 95.0 63.7* 84.4   Cardiac Enzymes  Recent Labs Lab 04/30/14 0845  04/30/14 1415  TROPONINI 59.45* >80.00*   Glucose  Recent Labs Lab 05/06/14 0335 05/06/14 0726 05/06/14 1201 05/06/14 1531 05/06/14 1942 05/07/14 0339  GLUCAP 121* 125* 140* 148* 118* 128*   Imaging Dg Chest Port 1 View  05/07/2014   CLINICAL DATA:  Acute respiratory failure  EXAM: PORTABLE CHEST - 1 VIEW  COMPARISON:  05/06/2014  FINDINGS: Cardiac shadow is stable. Postsurgical changes are again seen. An endotracheal tube, nasogastric catheter and right jugular central venous line are again seen and stable in appearance. Minimal persistent changes in the bases are seen stable from the prior exam. No new focal abnormality is seen. No bony abnormality is noted.  IMPRESSION: Tubes and lines as described above.  No significant interval change from the previous day.   Electronically Signed   By: Inez Catalina M.D.   On: 05/07/2014 07:24   Dg Chest Port 1 View  05/06/2014   CLINICAL DATA:  Respiratory acidosis.  EXAM: PORTABLE CHEST - 1 VIEW  COMPARISON:  05/05/2014.  FINDINGS: Endotracheal tube, NG tube, right IJ line stable position. Prior CABG. Heart size stable. Bibasilar pulmonary infiltrates have improved slightly. No prominent pleural effusion. No pneumothorax. No acute bony abnormality.  IMPRESSION: 1. Lines and tubes in stable position. 2. Interim slight improvement of bibasilar pulmonary alveolar infiltrates consistent with partial clearing of pulmonary edema and/or pneumonia. 3. Prior CABG.  Heart size stable.   Electronically Signed   By: Marcello Moores  Register   On: 05/06/2014 07:48   Dg Chest Port 1 View  05/05/2014   CLINICAL DATA:  Acute respiratory failure  EXAM: PORTABLE CHEST - 1 VIEW  COMPARISON:  05/04/2014  FINDINGS: Cardiomediastinal silhouette is stable. Status post median sternotomy. Stable endotracheal and NG tube position. Right IJ central line is unchanged in position. Persistent mild congestion/pulmonary edema. Probable bilateral small pleural effusion with bilateral basilar  atelectasis or infiltrate.  IMPRESSION: Stable support apparatus. Persistent mild congestion/pulmonary edema. Probable bilateral small pleural effusion with bilateral basilar atelectasis or infiltrate.   Electronically Signed   By: Lahoma Crocker M.D.   On: 05/05/2014 08:42    ASSESSMENT / PLAN:  CARDIOVASCULAR A:  Acute inferior STEMI - s/p emergent PCI with balloon angioplasty to LCx and left main 1/22 Complete heart block - s/p temp wire placement 1/22 Cardiogenic Shock - s/p IABP placement 1/22, off pressors NOt a surgical candidate Hx HLD, PVD PAF, appears to be in flutter this AM on monitor Intermittent bradycardia Complete heart block--improved with atropine  P:  Cardiology following Amiodarone gtt--off Plavix Atropine prn unlikely for pacer to change outcome Discussed with cvts, unlikley to be candidate but would reconsider if extubated successfully  PULMONARY OETT 1/22 >>> A: Acute respiratory failure COPD without evidence of exacerbation Secretions  P:  Transitioning to comfort measures possible, will discuss with family Try to wean cpap 5 ps 5, goal 1 hr, current vent settings 600/03/16/49% Continue lasix BD PRN  RENAL A:   Hypokalemia--resolved Hyponatremia--resolved pulm edema P:   BMP in AM Monitor I/O's +2.9L since admission Lasix to continue  GASTROINTESTINAL A:   GI prophylaxis Nutrition Hx Diverticulosis P:   SUP: Pantoprazole. TF to goal Still no BM miralax dulc supp  HEMATOLOGIC A:   VTE Prophylaxis Anemia Thrombocytopenia--resolved P:  CBC in AM Off heparin gtt HSQ  INFECTIOUS A:   ?PNA-- tmax 102.74fthis admission Procalcitonin neg, all less 2 LA 1.1 Unimpressed infection  P:   vanco 1/23 >> 1/27 Zosyn 1/23 >> 1/27  Blood  cx 1/23: No growth Sputum cx 1/23>>>NF  ENDOCRINE CBG (last 3)  A:   Hyperglycemia P:   SSI if glucose consistently > 180  NEUROLOGIC A:   Acute metabolic encephalopathy Agitation  increased Hx TIA, peripheral neuropathy  P:   Prn fent RASS goal: 0  Daily WUA Holding outpatient lorazepam Off Precedex - no restart brady   Family updated: 1/28 met with son, E link discussed with family overnight  Interdisciplinary Family Meeting v Palliative Care Meeting:  Due by: 1/29  Signed: SJerene Pitch MD PGY-3, Internal Medicine Resident Pager: [phone] 05/07/2014,8:07 AM   STAFF NOTE: ILinwood Dibbles MD FACP have personally reviewed patient's available data, including medical history, events of note, physical examination and test results as part of my evaluation. I have discussed with resident/NP and other care providers such as pharmacist, RN and RRT. In addition, I personally evaluated patient and elicited key findings of: wean if able, pacer i think would not change outcome, will discuss with family option of extubation and obervation, comfort of fails, no ABX required, lasix maintain The patient is critically ill with multiple organ systems failure and requires high complexity decision making for assessment and support, frequent evaluation and titration of therapies, application of advanced monitoring technologies and extensive interpretation of multiple databases.   Critical Care Time devoted to patient care services described in this note is 30 Minutes. This time reflects time of care of this signee: DMerrie Roof MD FACP. This critical care time does not reflect procedure time, or teaching time or supervisory time of PA/NP/Med student/Med Resident etc but could involve care discussion time. Rest per NP/medical resident whose note is outlined above and that I agree with   DLavon Paganini FTitus Mould MD, FBeechwoodPgr: 3Butte CityPulmonary & Critical Care 05/07/2014 11:22 AM

## 2014-05-07 NOTE — Progress Notes (Signed)
PROGRESS NOTE  Subjective:   Mr. [name] is a 74 year old gentleman with a history of coronary artery disease-status post coronary artery bypass grafting. His admitted on January 21 with an inferior posterior ST segment elevation myocardial infarction. Was compensated with complete heart block and ventricular talked tachycardia arrest. Cardiac cath revealed left dominant circulation. He he had an atretic internal mammary artery. The saphenous vein graft to the OM was occluded.  He required temporary venous pacemaker and an intra-aortic balloon pump for cardiogenic shock. At that time he was felt not to be a good candidate for redo cabbage.    Dr. Martinique was able to angioplasty  the left main and left circumflex with minimal improvement.  BP has been stable. He is off pressors.  The IABP has been pulled.  He has been seen by Dr. Servando Snare and was thought to be a poor candidate for CABG at this time.  He has continued to have episodes of paroxysmal atrial fib.   He had bradycardia last night - responded to atropine. Family has made him a DNR  Objective:    Vital Signs:   Temp:  [97.3 F (36.3 C)-99 F (37.2 C)] 98 F (36.7 C) (01/29 0800) Pulse Rate:  [39-88] 81 (01/29 0400) Resp:  [12-27] 12 (01/29 0800) BP: (71-154)/(30-96) 93/38 mmHg (01/29 0800) SpO2:  [88 %-100 %] 97 % (01/29 0829) FiO2 (%):  [40 %-60 %] 50 % (01/29 0829) Weight:  [173 lb 4.5 oz (78.6 kg)] 173 lb 4.5 oz (78.6 kg) (01/29 0500)  Last BM Date:  (unknown)   24-hour weight change: Weight change: -2 lb 6.8 oz (-1.1 kg)  Weight trends: Filed Weights   05/05/14 0500 05/06/14 0339 05/07/14 0500  Weight: 183 lb 10.3 oz (83.3 kg) 175 lb 11.3 oz (79.7 kg) 173 lb 4.5 oz (78.6 kg)    Intake/Output:  01/28 0701 - 01/29 0700 In: 2412.4 [I.V.:407.4; NG/GT:2005] Out: 3870 [Urine:3870] Total I/O In: -  Out: 300 [Urine:300]   Physical Exam: BP 93/38 mmHg  Pulse 81  Temp(Src) 98 F (36.7 C) (Oral)   Resp 12  Ht 5\' 11"  (1.803 m)  Wt 173 lb 4.5 oz (78.6 kg)  BMI 24.18 kg/m2  SpO2 97%  Wt Readings from Last 3 Encounters:  05/07/14 173 lb 4.5 oz (78.6 kg)  04/14/14 172 lb (78.019 kg)  01/11/14 168 lb (76.204 kg)    General: Vital signs reviewed and noted.   Chronically ill   Head: Normocephalic, atraumatic.  Eyes: conjunctivae/corneas clear.  EOM's intact.   Throat: intubated  Neck:  normal   Lungs:  on vent   Heart:  RR   Abdomen:  Soft, non-tender, non-distended    Extremities: No edema.    echymosis on arms  ,   Neurologic:  opens eyes.  Seems to understand some of what we are saying   Psych:  unable to assess at this time.    Labs: BMET:  Recent Labs  05/05/14 0430 05/06/14 0420 05/07/14 0400  NA 134* 136 138  K 3.4* 4.0 4.4  CL 99 98 98  CO2 27 31 36*  GLUCOSE 131* 152* 124*  BUN 16 22 31*  CREATININE 0.93 1.02 1.03  CALCIUM 7.7* 8.3* 8.8  MG 1.9 2.1  --   PHOS 2.7 2.8  --     Liver function tests: No results for input(s): AST, ALT, ALKPHOS, BILITOT, PROT, ALBUMIN in the last 72 hours. No results for input(s): LIPASE, AMYLASE in  the last 72 hours.  CBC:  Recent Labs  05/06/14 0420 05/07/14 0400  WBC 5.6 6.2  HGB 9.7* 10.1*  HCT 28.1* 29.4*  MCV 93.0 95.1  PLT 182 235    Cardiac Enzymes: No results for input(s): CKTOTAL, CKMB, TROPONINI in the last 72 hours.  Coagulation Studies: No results for input(s): LABPROT, INR in the last 72 hours.  Other: Invalid input(s): POCBNP No results for input(s): DDIMER in the last 72 hours. No results for input(s): HGBA1C in the last 72 hours. No results for input(s): CHOL, HDL, LDLCALC, TRIG, CHOLHDL in the last 72 hours. No results for input(s): TSH, T4TOTAL, T3FREE, THYROIDAB in the last 72 hours.  Invalid input(s): FREET3 No results for input(s): VITAMINB12, FOLATE, FERRITIN, TIBC, IRON, RETICCTPCT in the last 72 hours.   Other results:  Tele:  NSR currently.  Personal review of the tele  review shows paroxysmal atrial fib.   Medications:    Infusions: . sodium chloride 5 mL/hr at 05/06/14 1237  . amiodarone Stopped (05/06/14 1215)  . feeding supplement (VITAL AF 1.2 CAL) 1,000 mL (05/06/14 2252)  . fentaNYL infusion INTRAVENOUS 150 mcg/hr (05/07/14 0025)    Scheduled Medications: . antiseptic oral rinse  7 mL Mouth Rinse QID  . apixaban  5 mg Oral BID  . atorvastatin  80 mg Per Tube q1800  . chlorhexidine  15 mL Mouth Rinse BID  . clopidogrel  75 mg Oral Daily  . feeding supplement (PRO-STAT SUGAR FREE 64)  30 mL Oral Daily  . furosemide  40 mg Intravenous Q12H  . insulin aspart  0-9 Units Subcutaneous 6 times per day  . ipratropium-albuterol  3 mL Nebulization Q6H  . pantoprazole sodium  40 mg Per Tube Daily  . polyethylene glycol  17 g Per Tube BID  . potassium chloride  40 mEq Oral BID   I have reviewed  Dr. Everrett Coombe note  Assessment/ Plan:     1.   STEMI (ST elevation myocardial infarction)  the patient was found have severe three-vessel coronary artery disease. Dr. Martinique was able to open the left main slightly and was able to open the circumflex artery slightly. He was unable to wire the left anterior descending artery because of tortuosity and an acute angle.  We will continue with current medical therapy.  Continue medical therapy  for his severe CAD Our PCI options are limited.   We may have to discuss Palliative care - especially if he does not make progress    2. Cardiogenic shock:  He is currently off pressures. He remains stable    3. Cardiac arrest:  He seems to be doing better.    4. VT (ventricular tachycardia):  Is currently on amiodarone drip. Continue the drip now.  Can transition to PO once he is extubated.      5. CHB (complete heart block):  He responded to atropone    6.  hypokalemia:    7. Paroxysmal atrial fib.: will need to start Eliquis.  Will DC ASA since he is on plavix  CHADS 2 VASC of 6 ( age of 44, hx of CVA, HTN, DM)   I  agree that his prognosis is poor.  Continue current supportive care. Appreciate assistance of PCCM and TCTS consultants.  Disposition:     Continue medical therapy.   Length of Stay: 8  Thayer Headings, Brooke Bonito., MD, [facility] 05/07/2014, 8:31 AM Office [phone] Pager [phone]

## 2014-05-08 LAB — GLUCOSE, CAPILLARY
GLUCOSE-CAPILLARY: 134 mg/dL — AB (ref 70–99)
Glucose-Capillary: 141 mg/dL — ABNORMAL HIGH (ref 70–99)
Glucose-Capillary: 121 mg/dL — ABNORMAL HIGH (ref 70–99)
Glucose-Capillary: 107 mg/dL — ABNORMAL HIGH (ref 70–99)
Glucose-Capillary: 147 mg/dL — ABNORMAL HIGH (ref 70–99)
Glucose-Capillary: 112 mg/dL — ABNORMAL HIGH (ref 70–99)

## 2014-05-08 LAB — BASIC METABOLIC PANEL
ANION GAP: 7 (ref 5–15)
BUN: 33 mg/dL — AB (ref 6–23)
CHLORIDE: 98 mmol/L (ref 96–112)
CO2: 31 mmol/L (ref 19–32)
CREATININE: 1.15 mg/dL (ref 0.50–1.35)
Calcium: 8.9 mg/dL (ref 8.4–10.5)
GFR, EST AFRICAN AMERICAN: 70 mL/min — AB (ref >90)
GFR, EST NON AFRICAN AMERICAN: 60 mL/min — AB (ref >90)
Glucose, Bld: 127 mg/dL — ABNORMAL HIGH (ref 70–99)
POTASSIUM: 4.6 mmol/L (ref 3.5–5.1)
SODIUM: 136 mmol/L (ref 135–145)

## 2014-05-08 MED ORDER — CHLORHEXIDINE GLUCONATE 0.12 % MT SOLN
15.0000 mL | Freq: Two times a day (BID) | OROMUCOSAL | Status: DC
  Administered 2014-05-08 – 2014-05-13 (×11): 15 mL via OROMUCOSAL
  Filled 2014-05-08 (×15): qty 15

## 2014-05-08 MED ORDER — SODIUM CHLORIDE 0.9 % IJ SOLN
10.0000 mL | INTRAMUSCULAR | Status: DC | PRN
  Administered 2014-05-08: 10 mL
  Administered 2014-05-09: 20 mL
  Administered 2014-05-09 – 2014-05-10 (×2): 10 mL
  Administered 2014-05-11: 40 mL
  Administered 2014-05-12 – 2014-05-14 (×2): 20 mL
  Filled 2014-05-08 (×6): qty 40

## 2014-05-08 MED ORDER — AMIODARONE HCL 200 MG PO TABS
200.0000 mg | ORAL_TABLET | Freq: Two times a day (BID) | ORAL | Status: DC
  Administered 2014-05-08 – 2014-05-13 (×11): 200 mg via ORAL
  Filled 2014-05-08 (×14): qty 1

## 2014-05-08 MED ORDER — CETYLPYRIDINIUM CHLORIDE 0.05 % MT LIQD
7.0000 mL | Freq: Two times a day (BID) | OROMUCOSAL | Status: DC
  Administered 2014-05-08 – 2014-05-14 (×7): 7 mL via OROMUCOSAL

## 2014-05-08 MED ORDER — BISACODYL 10 MG RE SUPP
10.0000 mg | Freq: Every day | RECTAL | Status: DC | PRN
  Administered 2014-05-08: 10 mg via RECTAL
  Filled 2014-05-08: qty 1

## 2014-05-08 NOTE — Progress Notes (Signed)
CARDIAC REHAB PHASE I   PRE:  Rate/Rhythm: 78 Afib  BP:  Supine: 120/50  Sitting:   Standing:    SaO2: 92 RA  MODE:  Ambulation: in room ft   POST:  Rate/Rhythm: 89  BP:  Supine:   Sitting: 104/60  Standing:    SaO2: 93 RA 1030-1115 Assisted X 1 used walker and gait belt to ambulate. Pt weak with standing, but he was able to walk to sink in room and around bed to recliner. He is weak,unsteady and c/o of chest soreness with any movement. Pt in recliner with call light in reach and chair alarm on. Pt needs Physical Therapy consult to help with strengthening. He was exhausted by end of walk, but states it felt good to walk and be in chair.  Rodney Langton RN 05/08/2014 11:11 AM

## 2014-05-08 NOTE — Progress Notes (Signed)
PROGRESS NOTE  Subjective:   [name] is a 74 year old gentleman with a history of coronary artery disease-status post coronary artery bypass grafting. His admitted on January 21 with an inferior posterior ST segment elevation myocardial infarction. Was compensated with complete heart block and ventricular talked tachycardia arrest. Cardiac cath revealed left dominant circulation. He he had an atretic internal mammary artery. The saphenous vein graft to the OM was occluded.  He required temporary venous pacemaker and an intra-aortic balloon pump for cardiogenic shock. At that time he was felt not to be a good candidate for redo cabbage.    Dr. Martinique was able to angioplasty  the left main and left circumflex with minimal improvement.  BP has been stable. He is off pressors.  The IABP has been pulled.  He has been seen by Dr. Servando Snare and was thought to be a poor candidate for CABG at this time.  He has continued to have episodes of paroxysmal atrial fib.   He had bradycardia last night - responded to atropine. Family has made him a DNR  He has been extubated and has been transferred to 2W.   Objective:    Vital Signs:   Temp:  [97.8 F (36.6 C)-98.2 F (36.8 C)] 97.8 F (36.6 C) (01/30 0454) Pulse Rate:  [65-82] 69 (01/30 0454) Resp:  [12-26] 20 (01/30 0454) BP: (91-124)/(35-56) 120/50 mmHg (01/30 1121) SpO2:  [82 %-99 %] 90 % (01/30 0751) Weight:  [165 lb 9.1 oz (75.1 kg)] 165 lb 9.1 oz (75.1 kg) (01/30 0104)  Last BM Date:  (unknown)   24-hour weight change: Weight change: -7 lb 11.5 oz (-3.5 kg)  Weight trends: Filed Weights   05/06/14 0339 05/07/14 0500 05/08/14 0104  Weight: 175 lb 11.3 oz (79.7 kg) 173 lb 4.5 oz (78.6 kg) 165 lb 9.1 oz (75.1 kg)    Intake/Output:  01/29 0701 - 01/30 0700 In: 149 [P.O.:300; I.V.:195; NG/GT:240] Out: 3700 [Urine:3700] Total I/O In: -  Out: 1000 [Urine:1000]   Physical Exam: BP 120/50 mmHg  Pulse 69  Temp(Src)  97.8 F (36.6 C) (Oral)  Resp 20  Ht 5\' 11"  (1.803 m)  Wt 165 lb 9.1 oz (75.1 kg)  BMI 23.10 kg/m2  SpO2 90%  Wt Readings from Last 3 Encounters:  05/08/14 165 lb 9.1 oz (75.1 kg)  04/14/14 172 lb (78.019 kg)  01/11/14 168 lb (76.204 kg)    General: Vital signs reviewed and noted.   Chronically ill   Head: Normocephalic, atraumatic.  Eyes: conjunctivae/corneas clear.  EOM's intact.   Throat: R IJ line in place, hoarse voice  Neck:  normal   Lungs:  whonchi   Heart:  RR   Abdomen:  Soft, non-tender, non-distended    Extremities: No edema.    echymosis on arms  ,   Neurologic:  opens eyes.  Seems to understand some of what we are saying   Psych:  unable to assess at this time.    Labs: BMET:  Recent Labs  05/06/14 0420 05/07/14 0400 05/08/14 0445  NA 136 138 136  K 4.0 4.4 4.6  CL 98 98 98  CO2 31 36* 31  GLUCOSE 152* 124* 127*  BUN 22 31* 33*  CREATININE 1.02 1.03 1.15  CALCIUM 8.3* 8.8 8.9  MG 2.1  --   --   PHOS 2.8  --   --     Liver function tests: No results for input(s): AST, ALT, ALKPHOS, BILITOT, PROT, ALBUMIN  in the last 72 hours. No results for input(s): LIPASE, AMYLASE in the last 72 hours.  CBC:  Recent Labs  05/06/14 0420 05/07/14 0400  WBC 5.6 6.2  HGB 9.7* 10.1*  HCT 28.1* 29.4*  MCV 93.0 95.1  PLT 182 235    Cardiac Enzymes: No results for input(s): CKTOTAL, CKMB, TROPONINI in the last 72 hours.  Coagulation Studies: No results for input(s): LABPROT, INR in the last 72 hours.  Other: Invalid input(s): POCBNP No results for input(s): DDIMER in the last 72 hours. No results for input(s): HGBA1C in the last 72 hours. No results for input(s): CHOL, HDL, LDLCALC, TRIG, CHOLHDL in the last 72 hours. No results for input(s): TSH, T4TOTAL, T3FREE, THYROIDAB in the last 72 hours.  Invalid input(s): FREET3 No results for input(s): VITAMINB12, FOLATE, FERRITIN, TIBC, IRON, RETICCTPCT in the last 72 hours.   Other results:  Tele:   NSR currently.  Personal review of the tele review shows paroxysmal atrial fib.   Medications:    Infusions: . sodium chloride 10 mL/hr (05/08/14 0322)  . amiodarone Stopped (05/06/14 1215)    Scheduled Medications: . antiseptic oral rinse  7 mL Mouth Rinse q12n4p  . apixaban  5 mg Oral BID  . atorvastatin  80 mg Per Tube q1800  . chlorhexidine  15 mL Mouth Rinse BID  . clopidogrel  75 mg Oral Daily  . feeding supplement (PRO-STAT SUGAR FREE 64)  30 mL Oral Daily  . furosemide  40 mg Intravenous Q12H  . insulin aspart  0-9 Units Subcutaneous 6 times per day  . ipratropium-albuterol  3 mL Nebulization Q6H  . pantoprazole sodium  40 mg Per Tube Daily  . polyethylene glycol  17 g Per Tube BID  . potassium chloride  40 mEq Oral BID   I have reviewed  Dr. Everrett Coombe note  Assessment/ Plan:     1.   STEMI (ST elevation myocardial infarction)  the patient was found have severe three-vessel coronary artery disease. Dr. Martinique was able to open the left main slightly and was able to open the circumflex artery slightly. He was unable to wire the left anterior descending artery because of tortuosity and an acute angle.  We will continue with current medical therapy.  Continue medical therapy  for his severe CAD Our PCI options are limited.   Will continue current meds.  He has made some progress     2. Cardiogenic shock:  He is currently off pressures. He remains stable    3. Cardiac arrest:  He seems to be doing better.    4. VT (ventricular tachycardia):     Will change IV amio to PO amio.      5. CHB (complete heart block):  He responded to atropone    6.  hypokalemia:    7. Paroxysmal atrial fib.: will need to start Eliquis.  Will DC ASA since he is on plavix  CHADS 2 VASC of 6 ( age of 65, hx of CVA, HTN, DM)   I agree that his prognosis is poor.  Continue current supportive care. Appreciate assistance of PCCM and TCTS consultants.  Disposition:     Continue medical  therapy.   Length of Stay: 9  Thayer Headings, Brooke Bonito., MD, [facility] 05/08/2014, 12:13 PM Office [phone] Pager [phone]

## 2014-05-08 NOTE — Progress Notes (Signed)
Patient transferred to 2w from [facility], with belongings. CCMD notified. VSS, RA sating 93%, refusing O2. C/o being sore in chest and upper body most likely r/t CPR performed on admission. Refusing pain medicine at this time. Mentating well, alert and oriented and able to recall events leading up to admission and understanding of how sick he has been this admission. In talking with patient and discussing code status of DNR patient advised decision was made by family when he was intubated and very ill. He understands but states he would like to re-address code status and discuss with Dr. Martinique in the am. MD Clayborne Artist informed and plan agreed for patient to be full code overnight and address with team in am.   Will continue to monitor.

## 2014-05-08 NOTE — Progress Notes (Signed)
Spoke with MD Clayborne Artist re: order of amio gtt and other orders still remaining but not in affectfrom ICU.  Elink MD did not put in transfer orders and when called RN was instructed to call the ICU RN to confirm what was being done and what the patient was on. Per RN on [place] gtt was d/c'd in the afternoon 05/07/14 but not charted or documented at that time and never d/c'd in the order. Per cardiology note once extubated to be changed to PO amio, however, no PO amio ordered. Per MD will await team in am to review med orders as it is already close to am. Aware multiple orders that are no longer accurate remain. Will inform nurse in am shift to have team review and alter orders. Will continue to monitor patient.

## 2014-05-09 LAB — GLUCOSE, CAPILLARY
Glucose-Capillary: 135 mg/dL — ABNORMAL HIGH (ref 70–99)
Glucose-Capillary: 139 mg/dL — ABNORMAL HIGH (ref 70–99)
Glucose-Capillary: 133 mg/dL — ABNORMAL HIGH (ref 70–99)
Glucose-Capillary: 130 mg/dL — ABNORMAL HIGH (ref 70–99)

## 2014-05-09 LAB — BASIC METABOLIC PANEL WITH GFR
Anion gap: 5 (ref 5–15)
BUN: 32 mg/dL — ABNORMAL HIGH (ref 6–23)
CO2: 32 mmol/L (ref 19–32)
Calcium: 9.1 mg/dL (ref 8.4–10.5)
Chloride: 101 mmol/L (ref 96–112)
Creatinine, Ser: 1.11 mg/dL (ref 0.50–1.35)
GFR calc Af Amer: 73 mL/min — ABNORMAL LOW (ref >90)
GFR calc non Af Amer: 63 mL/min — ABNORMAL LOW (ref >90)
Glucose, Bld: 119 mg/dL — ABNORMAL HIGH (ref 70–99)
Potassium: 4.2 mmol/L (ref 3.5–5.1)
Sodium: 138 mmol/L (ref 135–145)

## 2014-05-09 MED ORDER — TRAZODONE HCL 50 MG PO TABS
50.0000 mg | ORAL_TABLET | Freq: Every day | ORAL | Status: DC
  Administered 2014-05-09 – 2014-05-13 (×5): 50 mg via ORAL
  Filled 2014-05-09 (×7): qty 1

## 2014-05-09 MED ORDER — WHITE PETROLATUM GEL
Status: DC | PRN
  Filled 2014-05-09: qty 28.35

## 2014-05-09 NOTE — Progress Notes (Signed)
PROGRESS NOTE  Subjective:   [name] is a 74 year old gentleman with a history of coronary artery disease-status post coronary artery bypass grafting. His admitted on January 21 with an inferior posterior ST segment elevation myocardial infarction. Was compensated with complete heart block and ventricular talked tachycardia arrest. Cardiac cath revealed left dominant circulation. He he had an atretic internal mammary artery. The saphenous vein graft to the OM was occluded.  He required temporary venous pacemaker and an intra-aortic balloon pump for cardiogenic shock. At that time he was felt not to be a good candidate for redo cabbage.    Dr. Martinique was able to angioplasty  the left main and left circumflex with minimal improvement.  BP has been stable. He is off pressors.  The IABP has been pulled.  He has been seen by Dr. Servando Snare and was thought to be a poor candidate for CABG at this time.  He has continued to have episodes of paroxysmal atrial fib.   He had bradycardia last night - responded to atropine. Family has made him a DNR  He has been extubated and has been transferred to 2W.   Objective:    Vital Signs:   Temp:  [97.4 F (36.3 C)-98.3 F (36.8 C)] 98.1 F (36.7 C) (01/31 0418) Pulse Rate:  [54-81] 81 (01/31 0418) Resp:  [18] 18 (01/31 0418) BP: (104-127)/(45-55) 107/55 mmHg (01/31 0418) SpO2:  [90 %-96 %] 90 % (01/31 0736)  Last BM Date: 05/08/14   24-hour weight change: Weight change:   Weight trends: Filed Weights   05/06/14 0339 05/07/14 0500 05/08/14 0104  Weight: 175 lb 11.3 oz (79.7 kg) 173 lb 4.5 oz (78.6 kg) 165 lb 9.1 oz (75.1 kg)    Intake/Output:  01/30 0701 - 01/31 0700 In: 140 [P.O.:120; I.V.:20] Out: 1400 [Urine:1400] Total I/O In: 120 [P.O.:120] Out: -    Physical Exam: BP 107/55 mmHg  Pulse 81  Temp(Src) 98.1 F (36.7 C) (Oral)  Resp 18  Ht 5\' 11"  (1.803 m)  Wt 165 lb 9.1 oz (75.1 kg)  BMI 23.10 kg/m2  SpO2 90%   Wt Readings from Last 3 Encounters:  05/08/14 165 lb 9.1 oz (75.1 kg)  04/14/14 172 lb (78.019 kg)  01/11/14 168 lb (76.204 kg)    General: Vital signs reviewed and noted.   Chronically ill   Head: Normocephalic, atraumatic.  Eyes: conjunctivae/corneas clear.  EOM's intact.   Throat: R IJ line in place, hoarse voice  Neck:  normal   Lungs:  whonchi   Heart:  RR   Abdomen:  Soft, non-tender, non-distended    Extremities: No edema.    echymosis on arms  ,   Neurologic:  opens eyes.  Seems to understand some of what we are saying   Psych:  unable to assess at this time.    Labs: BMET:  Recent Labs  05/08/14 0445 05/09/14 0500  NA 136 138  K 4.6 4.2  CL 98 101  CO2 31 32  GLUCOSE 127* 119*  BUN 33* 32*  CREATININE 1.15 1.11  CALCIUM 8.9 9.1    Liver function tests: No results for input(s): AST, ALT, ALKPHOS, BILITOT, PROT, ALBUMIN in the last 72 hours. No results for input(s): LIPASE, AMYLASE in the last 72 hours.  CBC:  Recent Labs  05/07/14 0400  WBC 6.2  HGB 10.1*  HCT 29.4*  MCV 95.1  PLT 235    Cardiac Enzymes: No results for input(s): CKTOTAL, CKMB, TROPONINI  in the last 72 hours.  Coagulation Studies: No results for input(s): LABPROT, INR in the last 72 hours.  Other: Invalid input(s): POCBNP No results for input(s): DDIMER in the last 72 hours. No results for input(s): HGBA1C in the last 72 hours. No results for input(s): CHOL, HDL, LDLCALC, TRIG, CHOLHDL in the last 72 hours. No results for input(s): TSH, T4TOTAL, T3FREE, THYROIDAB in the last 72 hours.  Invalid input(s): FREET3 No results for input(s): VITAMINB12, FOLATE, FERRITIN, TIBC, IRON, RETICCTPCT in the last 72 hours.   Other results:  Tele:  NSR currently.  Personal review of the tele review shows paroxysmal atrial fib.   Medications:    Infusions: . sodium chloride 10 mL/hr (05/08/14 0322)    Scheduled Medications: . amiodarone  200 mg Oral BID  . antiseptic oral  rinse  7 mL Mouth Rinse q12n4p  . apixaban  5 mg Oral BID  . atorvastatin  80 mg Per Tube q1800  . chlorhexidine  15 mL Mouth Rinse BID  . clopidogrel  75 mg Oral Daily  . feeding supplement (PRO-STAT SUGAR FREE 64)  30 mL Oral Daily  . furosemide  40 mg Intravenous Q12H  . insulin aspart  0-9 Units Subcutaneous 6 times per day  . ipratropium-albuterol  3 mL Nebulization Q6H  . pantoprazole sodium  40 mg Per Tube Daily  . polyethylene glycol  17 g Per Tube BID  . potassium chloride  40 mEq Oral BID   I have reviewed  Dr. Everrett Coombe note  Assessment/ Plan:     1.   STEMI (ST elevation myocardial infarction)  the patient was found have severe three-vessel coronary artery disease. Dr. Martinique was able to open the left main slightly and was able to open the circumflex artery slightly. He was unable to wire the left anterior descending artery because of tortuosity and an acute angle.  We will continue with current medical therapy.  Continue medical therapy  for his severe CAD Our PCI options are limited.  Will advance diet.  Will continue current meds.  He has made some progress     2. Cardiogenic shock:  He is currently off pressures. He remains stable    3. Cardiac arrest:  He seems to be doing better.    4. VT (ventricular tachycardia):     Will change IV amio to PO amio.     5. CHB (complete heart block):  He responded to atropone    6.  hypokalemia:    7. Paroxysmal atrial fib.: will need to start Eliquis.  Will DC ASA since he is on plavix  CHADS 2 VASC of 6 ( age of 44, hx of CVA, HTN, DM)   He has had difficulty sleeping at night.  Will give Trazodone 50 mg QHS PRN  ( discussed with [facility] )   I agree that his prognosis is poor.  Continue current supportive care. Appreciate assistance of PCCM and TCTS consultants.  Disposition:     Continue medical therapy.   Length of Stay: 10  Thayer Headings, Brooke Bonito., MD, [facility] 05/09/2014, 12:17 PM Office [phone] Pager  [phone]

## 2014-05-10 ENCOUNTER — Encounter (HOSPITAL_COMMUNITY): Payer: Self-pay | Admitting: Physician Assistant

## 2014-05-10 DIAGNOSIS — I2510 Atherosclerotic heart disease of native coronary artery without angina pectoris: Secondary | ICD-10-CM

## 2014-05-10 LAB — BASIC METABOLIC PANEL
ANION GAP: 7 (ref 5–15)
BUN: 30 mg/dL — ABNORMAL HIGH (ref 6–23)
CALCIUM: 8.8 mg/dL (ref 8.4–10.5)
CO2: 30 mmol/L (ref 19–32)
Chloride: 102 mmol/L (ref 96–112)
Creatinine, Ser: 1.24 mg/dL (ref 0.50–1.35)
GFR calc Af Amer: 64 mL/min — ABNORMAL LOW (ref >90)
GFR, EST NON AFRICAN AMERICAN: 55 mL/min — AB (ref >90)
Glucose, Bld: 130 mg/dL — ABNORMAL HIGH (ref 70–99)
Potassium: 3.8 mmol/L (ref 3.5–5.1)
SODIUM: 139 mmol/L (ref 135–145)

## 2014-05-10 MED ORDER — IPRATROPIUM-ALBUTEROL 0.5-2.5 (3) MG/3ML IN SOLN: 3.0000 mL | RESPIRATORY_TRACT | Status: DC | PRN

## 2014-05-10 MED ORDER — CARVEDILOL 3.125 MG PO TABS
1.5600 mg | ORAL_TABLET | Freq: Two times a day (BID) | ORAL | Status: DC
  Administered 2014-05-10 – 2014-05-11 (×2): 1.56 mg via ORAL
  Filled 2014-05-10 (×4): qty 0.5

## 2014-05-10 NOTE — Progress Notes (Signed)
CARDIAC REHAB PHASE I   PRE:  Rate/Rhythm: 113 afib    BP: sitting 107/65    SaO2: 98 RA  MODE:  Ambulation: 80 ft   POST:  Rate/Rhythm: 95 afib    BP: sitting 119/76     SaO2: 98 RA  Pt to BSC recently then walked with [place]. Assist x2 with gait belt, min assist. Used RW. Seemed to be much stronger this pm. Followed commands well, just quiet. Wife present and pt able to st how many years they have been married. To bed after walking for rest. Will f/u. [phone]   Darrick Meigs CES, ACSM 05/10/2014 3:09 PM

## 2014-05-10 NOTE — Clinical Social Work Psychosocial (Signed)
Clinical Social Work Department BRIEF PSYCHOSOCIAL ASSESSMENT 05/10/2014  Patient:  [name], [name]     Account Number:  [id number]     Admit date:  04/29/2014  Clinical Social Worker:  Domenica Reamer, Elmer  Date/Time:  05/10/2014 01:33 PM  Referred by:  Physician  Date Referred:  05/10/2014 Referred for  SNF Placement   Other Referral:   Interview type:  Patient Other interview type:    PSYCHOSOCIAL DATA Living Status:  WIFE Admitted from facility:   Level of care:   Primary support name:  Mikle Bosworth Herst Primary support relationship to patient:  SPOUSE Degree of support available:   Patient has high level of support from son who lives in [place]- though wife is supportive she is apparently Stage 4 Alzheimers and is not capable of being a caretaker for the patient- patient is usually responsible for her care.    CURRENT CONCERNS Current Concerns  Post-Acute Placement   Other Concerns:   Pt wife placement if pt is unable to return home    [place] / PLAN CSW spoke with pt concerning PT recommendation for SNF- pt is agreeable to rehab and prefers [facility] which is close to pts home.    CSW informed by [facility] that pt son is concerned about what will happen to pt wife if pt dies or needs extended care- CSW will assess with pt son and provide appropriate resources.   Assessment/plan status:  Psychosocial Support/Ongoing Assessment of Needs Other assessment/ plan:   FL2  PASAR   Information/referral to community resources:    PATIENT'S/FAMILY'S RESPONSE TO PLAN OF CARE: Patient and family are agreeable to SNF placement on short term basis but are hopeful that he will recover enough to return home soon and provide care for the pt wife again.       Domenica Reamer, [place] Social Worker [phone]

## 2014-05-10 NOTE — Care Management Note (Signed)
    Page 1 of 1   05/14/2014     4:02:10 PM CARE MANAGEMENT NOTE 05/14/2014  Patient:  [name], [name]   Account Number:  [id number]  Date Initiated:  05/03/2014  Documentation initiated by:  Elissa Hefty  Subjective/Objective Assessment:   adm w stemi-vent     Action/Plan:   lives w fam, pcp dr pltonikov   Anticipated DC Date:  05/14/2014   Anticipated DC Plan:  [place]  In-house referral  Clinical Social Worker      DC Planning Services  CM consult  Medication Assistance      Choice offered to / List presented to:             Status of service:  Completed, signed off Medicare Important Message given?  YES (If response is "NO", the following Medicare IM given date fields will be blank) Date Medicare IM given:  05/10/2014 Medicare IM given by:  Marvetta Gibbons Date Additional Medicare IM given:  05/13/2014 Additional Medicare IM given by:  Marvetta Gibbons  Discharge Disposition:  [place]  Per UR Regulation:  Reviewed for med. 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 to follow up and give resources and potential options for after pt finished with rehab - this assistance most likely will be out of pocket expenses for additional help in the home.  1/29 1124 debbie dowell rn,bsn pt will have 20.00 per month copay for eliquis w no prior auth req.

## 2014-05-10 NOTE — Progress Notes (Signed)
PULMONARY / CRITICAL CARE MEDICINE   Name: [name] MRN: [id number] DOB: January 20, 1941    ADMISSION DATE:  04/29/2014 CONSULTATION DATE:  05/10/2014  REFERRING MD :  Martinique  CHIEF COMPLAINT:  STEMI  INITIAL PRESENTATION:  74 y.o. M brought to [facility] ED 1/22 with chest pain, SOB, N/V, diaphoresis.  He was found to have bradycardia and acute inferior STEMI and was subsequently taken to the cath lab for emergent PCI.  In cath lab, had worsening cardiogenic shock and developed CHB requiring temp pacer wire as well as IABP but did have successful balloon angioplasty of LCx and distal left main.  He proceeded to have multiple episodes of VT / VF requiring defib x 13.  He was intubated and started on Levophed, Amiodarone, Lidocaine.  PCCM was consulted for vent management.  STUDIES:  EKG 1/22 >>> acute inferoposterior STEMI pCXR 1/24: small pleural effusions b/l, stable L retrocardiac atelectasis pCXR 1/25: slight improvement in left base pCXR 1/28: slight improvement in bibasilar infiltrates  Lines: OETT 1/22>> NGT RIJ  SIGNIFICANT EVENTS: 1/22 - taken emergently to cath lab, unable to stent LAD due to significant calcification, LCx and distal left main balloon angioplastied, intubated 1/24: Cardiogenic shock, remains on pressors 1/25: Off pressors, episodes of Bradycardia and Hypotension, change of rhythm but back to SR, IABP removed yesterday 1/27: Lasix, d/c heparin gtt 1/28: Intermittent bradycardia, CHB at night, improved with atropine. Transitioned to more comfort measures. Amiodarone and precedex held.  1/29: Extubated 1/30: Transferred to tele  SUBJECTIVE: Bradycardia over weekend, responded to atropine.  No further episodes. Vitals stable.  No acute events overnight.  VITAL SIGNS: Temp:  [97.7 F (36.5 C)-98.3 F (36.8 C)] 98.3 F (36.8 C) (02/01 0454) Pulse Rate:  [76-86] 76 (02/01 0454) Resp:  [17-18] 18 (02/01 0454) BP: (110-111)/(43-49) 110/49 mmHg (02/01  0454) SpO2:  [96 %-97 %] 97 % (02/01 0454) Weight:  [72 kg (158 lb 11.7 oz)] 72 kg (158 lb 11.7 oz) (02/01 0454) HEMODYNAMICS:   VENTILATOR SETTINGS:   INTAKE / OUTPUT: Intake/Output      01/31 0701 - 02/01 0700 02/01 0701 - 02/02 0700   P.O. 480    I.V. (mL/kg)     Total Intake(mL/kg) 480 (6.7)    Urine (mL/kg/hr) 360 (0.2)    Stool     Total Output 360     Net +120          Urine Occurrence 1500 x    Stool Occurrence 3 x      PHYSICAL EXAMINATION: General: sitting up in chair visiting with family, in NAD. Neuro: A&O x 3, MAE's. HEENT: [place]/AT, MMM. Cardiovascular: IRIR, no M/R/G. Lungs: Resps shallow, unlabored.  CTA, no W/R/R. Abdomen: BS present, soft, NT/ND. Musculoskeletal: moving all extremities, no edema. Skin: Warm  LABS:  CBC  Recent Labs Lab 05/05/14 0430 05/06/14 0420 05/07/14 0400  WBC 5.4 5.6 6.2  HGB 9.7* 9.7* 10.1*  HCT 27.6* 28.1* 29.4*  PLT 144* 182 235   BMET  Recent Labs Lab 05/08/14 0445 05/09/14 0500 05/10/14 0510  NA 136 138 139  K 4.6 4.2 3.8  CL 98 101 102  CO2 31 32 30  BUN 33* 32* 30*  CREATININE 1.15 1.11 1.24  GLUCOSE 127* 119* 130*   Electrolytes  Recent Labs Lab 05/04/14 0300 05/05/14 0430 05/06/14 0420  05/08/14 0445 05/09/14 0500 05/10/14 0510  CALCIUM 7.7* 7.7* 8.3*  < > 8.9 9.1 8.8  MG 2.1 1.9 2.1  --   --   --   --  PHOS 2.6 2.7 2.8  --   --   --   --   < > = values in this interval not displayed. Sepsis Markers No results for input(s): LATICACIDVEN, PROCALCITON, O2SATVEN in the last 168 hours. ABG  Recent Labs Lab 05/04/14 0930 05/06/14 0433  PHART 7.433 7.481*  PCO2ART 35.4 39.6  PO2ART 63.7* 84.4   Cardiac Enzymes No results for input(s): TROPONINI, PROBNP in the last 168 hours. Glucose  Recent Labs Lab 05/08/14 1627 05/08/14 1939 05/08/14 2331 05/09/14 0408 05/09/14 0759 05/09/14 1130  GLUCAP 147* 112* 139* 135* 133* 130*   Imaging No results found.  ASSESSMENT /  PLAN:  CARDIOVASCULAR A:  Acute inferior STEMI - s/p emergent PCI with balloon angioplasty to LCx and left main 1/22 Complete heart block - s/p temp wire placement 1/22 Cardiogenic Shock - s/p IABP placement 1/22, IABP now pulled and pressors off Poor surgical candidate per Dr. Servando Snare  Hx HLD, PVD PAF Intermittent bradycardia - resolved s/p atropine P:  Cardiology following, continue current medical therapy. Continue PO amio, Plavix, Eliquis, Lasix per cards. Atropine PRN for bradycardia.  PULMONARY OETT 1/22 >>> 1/29 A: Acute respiratory failure - resolved COPD without evidence of exacerbation Secretions - resolved P:   Pulmonary hygiene. BD's PRN.  RENAL A:   Hypokalemia--resolved Hyponatremia--resolved P:   Correct electrolytes as indicated.  GASTROINTESTINAL A:   Nutrition Hx Diverticulosis P:   Heart healthy diet.  HEMATOLOGIC A:   VTE Prophylaxis Anemia Thrombocytopenia--resolved P:  Plavix, Eliquis. CBC in AM  INFECTIOUS A:   ?PNA-- tmax 102.14f this admission Procalcitonin neg, all less 2 Unimpressed infection P:   vanco 1/23 >> 1/27 Zosyn 1/23 >> 1/27 Blood  cx 1/23: neg Sputum cx 1/23>>> neg  ENDOCRINE CBG (last 3)  A:   Hyperglycemia P:   SSI if glucose consistently > 180  NEUROLOGIC A:   Acute metabolic encephalopathy - resolved Agitation increased - resolved Hx TIA, peripheral neuropathy P:   D/c PRN fentanyl, morphine, midazolam. Off Precedex - no restart brady   Family updated: Wife.  Interdisciplinary Family Meeting v Palliative Care Meeting:  Due by: N/A.   Pt doing well and stable from a pulmonary standpoint. PCCM will sign off.  Please do not hesitate to call us back if we can be of any further assistance.   Montey Hora, [place] [facility] Pgr: [phone]  or [phone] 05/10/2014, 11:16 AM   Reviewed.  PCCM will sign off.  Please call if additional help  needed.  Chesley Mires, MD [facility] Pulmonary/Critical Care 05/10/2014, 6:16 PM Pager:  [phone] After 3pm call: [phone]

## 2014-05-10 NOTE — Progress Notes (Signed)
Patient ID: [name], male   DOB: April 26, 1940, 74 y.o.   MRN: [id number]      [address]       [place] [address]             [phone]                 10 Days Post-Op Procedure(s) (LRB): LEFT HEART CATHETERIZATION WITH CORONARY ANGIOGRAM (N/A)  LOS: 11 days   Subjective: Now on telemetry, awake and neuro intact. Remains very weak, could only walk 80 feet today.  Objective: Vital signs in last 24 hours: Patient Vitals for the past 24 hrs:  BP Temp Temp src Pulse Resp SpO2 Weight  05/10/14 2038 (!) 103/35 mmHg 97.6 F (36.4 C) Oral (!) 44 18 98 % -  05/10/14 1807 (!) 100/48 mmHg - - 70 - - -  05/10/14 1426 (!) 112/50 mmHg 98.5 F (36.9 C) Oral 75 18 97 % -  05/10/14 0454 (!) 110/49 mmHg 98.3 F (36.8 C) Oral 76 18 97 % 158 lb 11.7 oz (72 kg)    Filed Weights   05/07/14 0500 05/08/14 0104 05/10/14 0454  Weight: 173 lb 4.5 oz (78.6 kg) 165 lb 9.1 oz (75.1 kg) 158 lb 11.7 oz (72 kg)    Hemodynamic parameters for last 24 hours:    Intake/Output from previous day: 01/31 0701 - 02/01 0700 In: 480 [P.O.:480] Out: 360 [Urine:360] Intake/Output this shift:    Scheduled Meds: . amiodarone  200 mg Oral BID  . antiseptic oral rinse  7 mL Mouth Rinse q12n4p  . apixaban  5 mg Oral BID  . atorvastatin  80 mg Per Tube q1800  . carvedilol  1.56 mg Oral BID WC  . chlorhexidine  15 mL Mouth Rinse BID  . clopidogrel  75 mg Oral Daily  . feeding supplement (PRO-STAT SUGAR FREE 64)  30 mL Oral Daily  . furosemide  40 mg Intravenous Q12H  . polyethylene glycol  17 g Per Tube BID  . potassium chloride  40 mEq Oral BID  . traZODone  50 mg Oral QHS   Continuous Infusions: . sodium chloride 10 mL/hr (05/08/14 0322)   PRN Meds:.acetaminophen, albuterol, atropine, bisacodyl, ipratropium-albuterol, morphine injection, ondansetron (ZOFRAN) IV, sodium chloride, white petrolatum  General appearance: alert and cooperative Neurologic: intact Heart: regular  rate and rhythm, S1, S2 normal, no murmur, click, rub or gallop Lungs: diminished breath sounds bibasilar Abdomen: soft, non-tender; bowel sounds normal; no masses,  no organomegaly Extremities: extremities normal, atraumatic, no cyanosis or edema and Homans sign is negative, no sign of DVT  Lab Results: CBC:No results for input(s): WBC, HGB, HCT, PLT in the last 72 hours. BMET:  Recent Labs  05/09/14 0500 05/10/14 0510  NA 138 139  K 4.2 3.8  CL 101 102  CO2 32 30  GLUCOSE 119* 130*  BUN 32* 30*  CREATININE 1.11 1.24  CALCIUM 9.1 8.8    PT/INR: No results for input(s): LABPROT, INR in the last 72 hours.   Radiology No results found.   Assessment/Plan: S/P Procedure(s) (LRB): LEFT HEART CATHETERIZATION WITH CORONARY ANGIOGRAM (N/A) Slowly improving, can consider redo CABG if continues to improve., on eliquis and Plavix     Grace Isaac MD 05/10/2014 9:03 PM

## 2014-05-10 NOTE — Progress Notes (Signed)
Utilization review completed.  

## 2014-05-10 NOTE — Progress Notes (Signed)
Medicare Important Message given? YES  (If response is "NO", the following Medicare IM given date fields will be blank)  Date Medicare IM given: 05/10/14 Medicare IM given by:  Faris Coolman  

## 2014-05-10 NOTE — Clinical Social Work Placement (Addendum)
Clinical Social Work Department CLINICAL SOCIAL WORK PLACEMENT NOTE 05/10/2014  Patient:  [name], [name]  Account Number:  [id number] Admit date:  04/29/2014  Clinical Social Worker:  Domenica Reamer, CLINICAL SOCIAL WORKER  Date/time:  05/10/2014 01:40 PM  Clinical Social Work is seeking post-discharge placement for this patient at the following level of care:   SKILLED NURSING   (*CSW will update this form in Epic as items are completed)   05/10/2014  Patient/family provided with [place] Department of Clinical Social Work's list of facilities offering this level of care within the geographic area requested by the patient (or if unable, by the patient's family).  05/10/2014  Patient/family informed of their freedom to choose among providers that offer the needed level of care, that participate in Medicare, Medicaid or managed care program needed by the patient, have an available bed and are willing to accept the patient.  05/10/2014  Patient/family informed of [facility]' ownership interest in [facility], as well as of the fact that they are under no obligation to receive care at this facility.  PASARR submitted to EDS on 05/10/2014 PASARR number received on 05/10/2014  FL2 transmitted to all facilities in geographic area requested by pt/family on  05/10/2014 FL2 transmitted to all facilities within larger geographic area on   Patient informed that his/her managed care company has contracts with or will negotiate with  certain facilities, including the following:     Patient/family informed of bed offers received:  05/11/14 Patient chooses bed at [facility] Physician recommends and patient chooses bed at    Patient to be transferred to  [facility] on   Patient to be transferred to facility by  Patient and family notified of transfer on  Name of family member notified:    The following physician request were entered in Epic:   Additional Comments: Domenica Reamer, Eva Social Worker [phone]

## 2014-05-10 NOTE — Progress Notes (Signed)
Patient Name: [name] Date of Encounter: 05/10/2014     Active Problems:   STEMI (ST elevation myocardial infarction)   Cardiogenic shock   Cardiac arrest   VT (ventricular tachycardia)   CHB (complete heart block)   CAD (coronary artery disease), native coronary artery   Acute respiratory failure   S/P CABG x 2   Acute respiratory acidosis    SUBJECTIVE  Some SOB last night. Otherwise no complaints.   CURRENT MEDS . amiodarone  200 mg Oral BID  . antiseptic oral rinse  7 mL Mouth Rinse q12n4p  . apixaban  5 mg Oral BID  . atorvastatin  80 mg Per Tube q1800  . chlorhexidine  15 mL Mouth Rinse BID  . clopidogrel  75 mg Oral Daily  . feeding supplement (PRO-STAT SUGAR FREE 64)  30 mL Oral Daily  . furosemide  40 mg Intravenous Q12H  . pantoprazole sodium  40 mg Per Tube Daily  . polyethylene glycol  17 g Per Tube BID  . potassium chloride  40 mEq Oral BID  . traZODone  50 mg Oral QHS    OBJECTIVE  Filed Vitals:   05/09/14 1849 05/09/14 1957 05/09/14 2006 05/10/14 0454  BP: 110/43 111/44  110/49  Pulse: 86 82  76  Temp: 97.7 F (36.5 C) 97.8 F (36.6 C)  98.3 F (36.8 C)  TempSrc: Oral Oral  Oral  Resp: 17 18  18   Height:      Weight:    158 lb 11.7 oz (72 kg)  SpO2: 97% 96% 96% 97%    Intake/Output Summary (Last 24 hours) at 05/10/14 1043 Last data filed at 05/09/14 2000  Gross per 24 hour  Intake    360 ml  Output    360 ml  Net      0 ml   Filed Weights   05/07/14 0500 05/08/14 0104 05/10/14 0454  Weight: 173 lb 4.5 oz (78.6 kg) 165 lb 9.1 oz (75.1 kg) 158 lb 11.7 oz (72 kg)    PHYSICAL EXAM  General: Pleasant, NAD. Chronically ill appearing.  Neuro: Alert and oriented X 3. Moves all extremities spontaneously. Psych: Normal affect. HEENT:  Normal  Neck: Supple without bruits or JVD. Lungs:  Resp regular and unlabored, decreased BS at bases.  Heart: RRR no s3, s4, or murmurs. Abdomen: Soft, non-tender, non-distended, BS + x 4.    Extremities: No clubbing, cyanosis or edema. DP/PT/Radials 2+ and equal bilaterally. echymosis on arms   Accessory Clinical Findings  CBC No results for input(s): WBC, NEUTROABS, HGB, HCT, MCV, PLT in the last 72 hours. Basic Metabolic Panel  Recent Labs  05/09/14 0500 05/10/14 0510  NA 138 139  K 4.2 3.8  CL 101 102  CO2 32 30  GLUCOSE 119* 130*  BUN 32* 30*  CREATININE 1.11 1.24  CALCIUM 9.1 8.8    TELE  NSR  Radiology/Studies  Dg Chest Port 1 View  05/07/2014   CLINICAL DATA:  Acute respiratory failure  EXAM: PORTABLE CHEST - 1 VIEW  COMPARISON:  05/06/2014  FINDINGS: Cardiac shadow is stable. Postsurgical changes are again seen. An endotracheal tube, nasogastric catheter and right jugular central venous line are again seen and stable in appearance. Minimal persistent changes in the bases are seen stable from the prior exam. No new focal abnormality is seen. No bony abnormality is noted.  IMPRESSION: Tubes and lines as described above.  No significant interval change from the previous day.  Electronically Signed   By: Inez Catalina M.D.   On: 05/07/2014 07:24   Dg Chest Port 1 View  05/06/2014   CLINICAL DATA:  Respiratory acidosis.  EXAM: PORTABLE CHEST - 1 VIEW  COMPARISON:  05/05/2014.  FINDINGS: Endotracheal tube, NG tube, right IJ line stable position. Prior CABG. Heart size stable. Bibasilar pulmonary infiltrates have improved slightly. No prominent pleural effusion. No pneumothorax. No acute bony abnormality.  IMPRESSION: 1. Lines and tubes in stable position. 2. Interim slight improvement of bibasilar pulmonary alveolar infiltrates consistent with partial clearing of pulmonary edema and/or pneumonia. 3. Prior CABG.  Heart size stable.   Electronically Signed   By: Marcello Moores  Register   On: 05/06/2014 07:48   Dg Chest Port 1 View  05/05/2014   CLINICAL DATA:  Acute respiratory failure  EXAM: PORTABLE CHEST - 1 VIEW  COMPARISON:  05/04/2014  FINDINGS: Cardiomediastinal  silhouette is stable. Status post median sternotomy. Stable endotracheal and NG tube position. Right IJ central line is unchanged in position. Persistent mild congestion/pulmonary edema. Probable bilateral small pleural effusion with bilateral basilar atelectasis or infiltrate.  IMPRESSION: Stable support apparatus. Persistent mild congestion/pulmonary edema. Probable bilateral small pleural effusion with bilateral basilar atelectasis or infiltrate.   Electronically Signed   By: Lahoma Crocker M.D.   On: 05/05/2014 08:42   Dg Chest Port 1 View  05/04/2014   CLINICAL DATA:  74 year old male with acute respiratory failure on a ventilator.  EXAM: PORTABLE CHEST - 1 VIEW  COMPARISON:  Chest x-ray 05/03/2014.  FINDINGS: An endotracheal tube is in place with tip 5.9 cm above the carina. There is a right-sided internal jugular central venous catheter with tip terminating in the distal superior vena cava. Transcutaneous pacer/defibrillator pads project over the mid and left hemithorax. A nasogastric tube is seen extending into the stomach, however, the tip of the nasogastric tube extends below the lower margin of the image. Lung volumes appear normal. There is cephalization of the pulmonary vasculature, indistinctness of the interstitial markings, and patchy airspace disease throughout the lungs bilaterally suggestive of moderate pulmonary edema. Small to moderate bilateral pleural effusions. Mild cardiomegaly. The patient is rotated to the right on today's exam, resulting in distortion of the mediastinal contours and reduced diagnostic sensitivity and specificity for mediastinal pathology. Atherosclerosis in the thoracic aorta.  IMPRESSION: 1. Support apparatus, as above. 2. The appearance the chest suggests underlying congestive heart failure, as above. 3. Bibasilar opacities presumably reflect a combination of atelectasis with superimposed small to moderate bilateral pleural effusions, however, underlying airspace  consolidation from infection or aspiration is not excluded.   Electronically Signed   By: Vinnie Langton M.D.   On: 05/04/2014 11:34   Dg Chest Port 1 View  05/03/2014   CLINICAL DATA:  ETT advanced.  Respiratory failure  EXAM: PORTABLE CHEST - 1 VIEW  COMPARISON:  05/02/2014  FINDINGS: The endotracheal tube tip is midway between the clavicular heads and the tracheal carina. The nasogastric tube extends well into the stomach and off the inferior edge of the image. Right jugular central line extends into the SVC about 1 cm below the azygos vein junction.  No pneumothorax is evident. No large effusions are evident. Mild basilar opacities are present, partially cleared on the left where there formerly was a more confluent opacity.  IMPRESSION: Lines and tubes as described.  Slight improvement in the left base.   Electronically Signed   By: Andreas Newport M.D.   On: 05/03/2014 16:16   Dg  Chest Port 1 View  05/02/2014   CLINICAL DATA:  Acute respiratory failure  EXAM: PORTABLE CHEST - 1 VIEW  COMPARISON:  05/01/2014  FINDINGS: Cardiac shadow is stable. An endotracheal tube, nasogastric catheter and right jugular line are again seen and stable in appearance. Postsurgical changes are again noted. Small pleural effusions are seen bilaterally. Stable retrocardiac atelectasis is noted.  IMPRESSION: Stable left retrocardiac atelectasis. Small pleural effusions bilaterally.  Tubes and lines as described.   Electronically Signed   By: Inez Catalina M.D.   On: 05/02/2014 07:14   Portable Chest Xray  05/01/2014   CLINICAL DATA:  Intubation  EXAM: PORTABLE CHEST - 1 VIEW  COMPARISON:  04/30/2014  FINDINGS: Endotracheal tube tip is just below the clavicular heads. The gastric suction tube at least reaches the diaphragm. Right IJ catheter is in stable position. The aortic balloon pump is at the level of the aortic arch, inflated at time of imaging.  Heart size remains normal. There is a persistent retrocardiac opacity,  likely atelectasis and pleural fluid given the myocardial infarction presentation. No pulmonary edema. No pneumothorax.  IMPRESSION: 1. Unchanged positioning of tubes and lines. 2. Stable retrocardiac atelectasis and small effusion.   Electronically Signed   By: Jorje Guild M.D.   On: 05/01/2014 06:41   Dg Chest Port 1 View  04/30/2014   CLINICAL DATA:  Central line placement.  EXAM: PORTABLE CHEST - 1 VIEW  COMPARISON:  None.  FINDINGS: Endotracheal tube, NG tube, right IJ line, mediastinal drainage catheter in stable position. Balloon pump noted with tip at upper portion of descending thoracic aorta. Prior CABG. Left lower lobe infiltrate. Small left pleural effusion. No acute bony abnormality.  IMPRESSION: 1. Interim placement of right IJ line. Tip in good anatomic position. Remaining lines and tubes in stable position. Balloon pump tip at the upper portion of the descending thoracic aorta. 2. Mild left lower lobe infiltrate and small left pleural effusion.   Electronically Signed   By: Marcello Moores  Register   On: 04/30/2014 16:47   Portable Chest Xray  04/30/2014   CLINICAL DATA:  Intubation  EXAM: PORTABLE CHEST - 1 VIEW  COMPARISON:  07/21/2009  FINDINGS: Endotracheal tube tip in good position at the clavicular heads. Gastric suction tube is short, as described on abdominal radiography. Interval advancement of aortic balloon pump since previous KUB, tip at the aortic arch. There is a temporary pacer wire from below with lead overlapping ventricles, presumably right ventricle.  No cardiomegaly. Aortic contours are negative. The patient has undergone CABG.  Mild perihilar atelectasis.  No edema or effusion.  No pneumothorax.  IMPRESSION: 1. Unremarkable appearance of endotracheal tube, aortic balloon pump, and temporary pacer wire. 2. Short nasogastric tube, reference KUB. 3. Perihilar atelectasis.  No pulmonary edema.   Electronically Signed   By: Jorje Guild M.D.   On: 04/30/2014 02:48   Dg Abd  Portable 1v  04/30/2014   CLINICAL DATA:  Nasogastric tube repositioning  EXAM: PORTABLE ABDOMEN - 1 VIEW  COMPARISON:  KUB from the same day at 2:14 a.m.  FINDINGS: The nasogastric tube has been advanced into the stomach, with tip in the distal body or antrum. The temporary pacer wire is in stable position, tip over the right ventricle.  IMPRESSION: 1. Nasogastric tube tip has advanced into the distal stomach. 2. Temporary pacer lead over the right ventricle.   Electronically Signed   By: Jorje Guild M.D.   On: 04/30/2014 04:27   Dg Abd  Portable 1v  04/30/2014   CLINICAL DATA:  Encounter for nasogastric tube  EXAM: PORTABLE ABDOMEN - 1 VIEW  COMPARISON:  Abdominal CT 11/04/2009  FINDINGS: Nasogastric tube tip is at the proximal stomach, with side port at the lower esophagus. Advancement by 8 cm would provide more secure positioning.  There is an arterial sheath in the left groin with aortic balloon pump at the level of L2. This is been advanced subsequent chest x-ray. Temporary pacer wire via right groin, tip not visualized.  These results were called by telephone at the time of interpretation on 04/30/2014 at 2:45 am to [place] who verbally acknowledged these results.  IMPRESSION: Nasogastric tube tip at the gastric cardia. Advancement by 8 cm would provide more secure positioning.   Electronically Signed   By: Jorje Guild M.D.   On: 04/30/2014 02:45    ASSESSMENT AND PLAN Mr. Donigan is a 74 year old gentleman with a history of CAD s/p CABG x2, TIA, PVD, HLD, COPD, tobacco abuse, who presented to [facility] on [facility]14 with an inferior posterior ST segment elevation myocardial infarction- this was c/b CHB and ventricular tachycardia arrest. Cardiac cath revealed left dominant circulation. He he had an atretic internal mammary artery. The saphenous vein graft to the OM was occluded. He required temporary venous pacemaker and an intra-aortic balloon pump for cardiogenic shock. At that time he was felt  not to be a good candidate for redo CABG. Dr. Martinique was able to angioplasty the left main and left circumflex with minimal improvement.  CAD/STEMI- -- The patient was found have severe 3V CAD. Dr. Martinique was able to open the left main slightly and was able to open the circumflex artery slightly. He was unable to wire the left anterior descending artery because of tortuosity and an acute angle.  -- We will continue with current medical therapy.  -- He has been seen by Dr. Servando Snare and was thought to be a poor candidate for CABG at this time bc of significance comorbities. STS risk mortality >25% and risk or mortality morbidity 75%.  Cardiogenic shock-  BP has been stable. He is off pressors. The IABP has been pulled. Extubated.  VT (ventricular tachycardia): Converted to 200mg  BID PO amio. No further episodes  CHB (complete heart block): He responded to atropine. Now resolved.  Hypokalemia:resolved.   Paroxysmal atrial fib- maintaining NSR.  -- Continue amiodarone 200mg  BID  -- CHADS 2 VASC of 6 ( age of 82, hx of CVA, HTN, DM). Started on Eliquis and DC'd ASA since he is on plavix    Dispo-  prognosis is poor. Continue current supportive care. He is now DNR.   Judy Pimple PA-C  Pager [phone]   Patient seen and examined. Agree with assessment and plan. Medical therapy approach with high STS risk mortality >25%.  Will add low dose carvedilol at 1/2 of 3.125 bid today and titrate as tolerates. May be a candidate for ranolazine if BP remains low.   Troy Sine, MD, [facility] 05/10/2014 11:17 AM

## 2014-05-10 NOTE — Evaluation (Signed)
Physical Therapy Evaluation Patient Details Name: [name] MRN: [id number] DOB: Jan 10, 1941 Today's Date: 05/10/2014   History of Present Illness  [name] with history of CAD s/p CABG 1993(unclear anatomy), PVD s/p PCI to left SFA and right SFA, hyperlipidemia, who presented to ED via EMS for inf-posterior STEMI and complete HB. Pt with extensive cardiac history.  Clinical Impression  Unclear of patient's baseline cognition. Pt is currently confused and is at an increased falls risk. Pt currently requiring max v/c's to reorient to place, date, and situation. Pt minA for all transfers and ambulation with use of RW. Pt currently unsafe to return home and care for self let alone return to primary caregiver for spouse. Unless patient's family can provide 24/7 assist/supervision pt unsafe to return home at this time. Pt to benefit from ST-SNF to achieve safe mod I function for safe transition home.    Follow Up Recommendations SNF    Equipment Recommendations  Rolling walker with 5" wheels    Recommendations for Other Services       Precautions / Restrictions Precautions Precautions: Fall Restrictions Weight Bearing Restrictions: No      Mobility  Bed Mobility Overal bed mobility: Needs Assistance Bed Mobility: Supine to Sit     Supine to sit: Min assist     General bed mobility comments: increased time, assist for trunk elevation  Transfers Overall transfer level: Needs assistance Equipment used: Rolling walker (2 wheeled) Transfers: Sit to/from Stand Sit to Stand: Min assist         General transfer comment: max directional v/c's for safety, required to hold onto bed rail for safe standing  Ambulation/Gait Ambulation/Gait assistance: Min assist Ambulation Distance (Feet): 30 Feet Assistive device: Rolling walker (2 wheeled) Gait Pattern/deviations: Step-through pattern;Shuffle Gait velocity: slow Gait velocity interpretation: Below normal speed for  age/gender General Gait Details: initially tried to amb without AD however pt unsteady and reaching for objects to hold onto. Pt with freq standing rest breaks and decreased attn span requiring freq v/c's to stay on task  Stairs            Wheelchair Mobility    Modified Rankin (Stroke Patients Only)       Balance Overall balance assessment: Needs assistance Sitting-balance support: Feet supported;No upper extremity supported Sitting balance-Leahy Scale: Fair     Standing balance support: Single extremity supported Standing balance-Leahy Scale: Poor Standing balance comment: pt with ant/post sway                             Pertinent Vitals/Pain Pain Assessment: Faces Pain Score: 5  Faces Pain Scale: Hurts little more Pain Location: R hip Pain Descriptors / Indicators: Grimacing Pain Intervention(s): Monitored during session    Home Living Family/patient expects to be discharged to:: Private residence Living Arrangements: Spouse/significant other Available Help at Discharge: Family;Available PRN/intermittently (spouse with Alzheimers) Type of Home: House Home Access: Level entry (or flight pending entry)     Home Layout: One level Home Equipment: Cane - single point Additional Comments: pt confused so unsure of PLOF accurate.    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 reports son is watching over wife at thie time     Hand Dominance   Dominant Hand: Right    Extremity/Trunk Assessment   Upper Extremity Assessment: Generalized weakness  Lower Extremity Assessment: Generalized weakness      Cervical / Trunk Assessment: Normal  Communication   Communication: No difficulties  Cognition Arousal/Alertness: Awake/alert Behavior During Therapy: WFL for tasks assessed/performed Overall Cognitive Status: Impaired/Different from baseline Area of Impairment:  Orientation;Problem solving;Safety/judgement;Memory Orientation Level: Disoriented to;Place;Time;Situation (reports its 06/10/14 and we're at [employer], [place])   Memory: Decreased short-term memory   Safety/Judgement: Decreased awareness of deficits   Problem Solving: Slow processing;Difficulty sequencing General Comments: pt able to recall 2/1 when reoriented to date however unable to recall [name].     General Comments      Exercises        Assessment/Plan    PT Assessment Patient needs continued PT services  PT Diagnosis Difficulty walking;Acute pain;Generalized weakness   PT Problem List Decreased strength;Decreased activity tolerance;Decreased range of motion;Decreased balance;Decreased mobility  PT Treatment Interventions DME instruction;Gait training;Stair training;Functional mobility training;Therapeutic activities;Therapeutic exercise;Balance training   PT Goals (Current goals can be found in the Care Plan section) Acute Rehab PT Goals Patient Stated Goal: home PT Goal Formulation: With patient Time For Goal Achievement: 05/17/14 Potential to Achieve Goals: Good    Frequency Min 3X/week   Barriers to discharge Decreased caregiver support wife with dementia and pt primary caregiver    Co-evaluation               End of Session Equipment Utilized During Treatment: Gait belt Activity Tolerance: Patient tolerated treatment well Patient left: in chair;with call bell/phone within reach;with chair alarm set Nurse Communication: Mobility status         Time: 2080-2233 PT Time Calculation (min) (ACUTE ONLY): 27 min   Charges:   PT Evaluation $Initial PT Evaluation Tier I: 1 Procedure PT Treatments $Gait Training: 8-22 mins   PT G CodesKingsley [name] 05/10/2014, 8:28 AM  [name], PT, DPT Pager #: [phone] Office #: [phone]

## 2014-05-11 DIAGNOSIS — I483 Typical atrial flutter: Secondary | ICD-10-CM

## 2014-05-11 LAB — BASIC METABOLIC PANEL
Anion gap: 7 (ref 5–15)
BUN: 31 mg/dL — ABNORMAL HIGH (ref 6–23)
CALCIUM: 9 mg/dL (ref 8.4–10.5)
CHLORIDE: 100 mmol/L (ref 96–112)
CO2: 28 mmol/L (ref 19–32)
CREATININE: 1.33 mg/dL (ref 0.50–1.35)
GFR calc Af Amer: 59 mL/min — ABNORMAL LOW (ref >90)
GFR, EST NON AFRICAN AMERICAN: 51 mL/min — AB (ref >90)
GLUCOSE: 178 mg/dL — AB (ref 70–99)
Potassium: 3.8 mmol/L (ref 3.5–5.1)
Sodium: 135 mmol/L (ref 135–145)

## 2014-05-11 LAB — CBC
HCT: 34.5 % — ABNORMAL LOW (ref 39.0–52.0)
HEMOGLOBIN: 11.6 g/dL — AB (ref 13.0–17.0)
MCH: 32.5 pg (ref 26.0–34.0)
MCHC: 33.6 g/dL (ref 30.0–36.0)
MCV: 96.6 fL (ref 78.0–100.0)
PLATELETS: 524 10*3/uL — AB (ref 150–400)
RBC: 3.57 MIL/uL — ABNORMAL LOW (ref 4.22–5.81)
RDW: 13 % (ref 11.5–15.5)
WBC: 8.6 10*3/uL (ref 4.0–10.5)

## 2014-05-11 MED ORDER — ATORVASTATIN CALCIUM 40 MG PO TABS
40.0000 mg | ORAL_TABLET | Freq: Every day | ORAL | Status: DC
  Administered 2014-05-11 – 2014-05-13 (×3): 40 mg
  Filled 2014-05-11 (×4): qty 1

## 2014-05-11 MED ORDER — FUROSEMIDE 20 MG PO TABS
20.0000 mg | ORAL_TABLET | Freq: Every day | ORAL | Status: DC
  Administered 2014-05-11 – 2014-05-14 (×4): 20 mg via ORAL
  Filled 2014-05-11 (×4): qty 1

## 2014-05-11 MED ORDER — RANOLAZINE ER 500 MG PO TB12
500.0000 mg | ORAL_TABLET | Freq: Two times a day (BID) | ORAL | Status: DC
  Administered 2014-05-11 – 2014-05-14 (×7): 500 mg via ORAL
  Filled 2014-05-11 (×9): qty 1

## 2014-05-11 MED ORDER — CARVEDILOL 3.125 MG PO TABS
3.1250 mg | ORAL_TABLET | Freq: Two times a day (BID) | ORAL | Status: DC
  Administered 2014-05-11 – 2014-05-14 (×6): 3.125 mg via ORAL
  Filled 2014-05-11 (×9): qty 1

## 2014-05-11 NOTE — Progress Notes (Signed)
CARDIAC REHAB PHASE I   PRE:  Rate/Rhythm: 89 aflutter    BP: sitting 97/39    SaO2: 96 RA  MODE:  Ambulation: 50 ft   POST:  Rate/Rhythm: 103 aflutter    BP: sitting 109/44     SaO2: 99 RA  Pt able to stand and walk with gait belt and RW, assist x1. C/o pain at gait belt and grimaced at times. When asked what was bothering him, he couldn't tell me. Turned around in hall without explanation of why. Decreased distance from yesterday. Seemed less able to communicate clearly today compared to yesterday. Denied CP however.  To recliner. Wife not very helpful.  [phone]  [name] [place] CES, ACSM 05/11/2014 3:32 PM

## 2014-05-11 NOTE — Progress Notes (Addendum)
Patient Name: [name] Date of Encounter: 05/11/2014     Active Problems:   STEMI (ST elevation myocardial infarction)   Cardiogenic shock   Cardiac arrest   VT (ventricular tachycardia)   CHB (complete heart block)   CAD (coronary artery disease), native coronary artery   Acute respiratory failure   S/P CABG x 2   Acute respiratory acidosis    SUBJECTIVE  No CP or SOB. Feeling okay.   CURRENT MEDS . amiodarone  200 mg Oral BID  . antiseptic oral rinse  7 mL Mouth Rinse q12n4p  . apixaban  5 mg Oral BID  . atorvastatin  80 mg Per Tube q1800  . carvedilol  1.56 mg Oral BID WC  . chlorhexidine  15 mL Mouth Rinse BID  . clopidogrel  75 mg Oral Daily  . feeding supplement (PRO-STAT SUGAR FREE 64)  30 mL Oral Daily  . furosemide  40 mg Intravenous Q12H  . polyethylene glycol  17 g Per Tube BID  . potassium chloride  40 mEq Oral BID  . traZODone  50 mg Oral QHS    OBJECTIVE  Filed Vitals:   05/10/14 1426 05/10/14 1807 05/10/14 2038 05/11/14 0434  BP: 112/50 100/48 103/35 107/60  Pulse: 75 70 44 102  Temp: 98.5 F (36.9 C)  97.6 F (36.4 C) 98.5 F (36.9 C)  TempSrc: Oral  Oral Oral  Resp: 18  18 18   Height:      Weight:      SpO2: 97%  98% 99%    Intake/Output Summary (Last 24 hours) at 05/11/14 0749 Last data filed at 05/11/14 0151  Gross per 24 hour  Intake    900 ml  Output   1275 ml  Net   -375 ml   Filed Weights   05/07/14 0500 05/08/14 0104 05/10/14 0454  Weight: 173 lb 4.5 oz (78.6 kg) 165 lb 9.1 oz (75.1 kg) 158 lb 11.7 oz (72 kg)    PHYSICAL EXAM  General: Pleasant, NAD. Chronically ill appearing.  Neuro: Alert and oriented X 3. Moves all extremities spontaneously. Psych: Normal affect. HEENT:  Normal  Neck: Supple without bruits or JVD. Lungs:  Resp regular and unlabored, decreased BS at bases.  Heart: irreg irreg.  no s3, s4, or murmurs. Abdomen: Soft, non-tender, non-distended, BS + x 4.  Extremities: No clubbing, cyanosis  or edema. DP/PT/Radials 2+ and equal bilaterally. echymosis on arms   Accessory Clinical Findings  CBC No results for input(s): WBC, NEUTROABS, HGB, HCT, MCV, PLT in the last 72 hours. Basic Metabolic Panel  Recent Labs  05/09/14 0500 05/10/14 0510  NA 138 139  K 4.2 3.8  CL 101 102  CO2 32 30  GLUCOSE 119* 130*  BUN 32* 30*  CREATININE 1.11 1.24  CALCIUM 9.1 8.8    TELE  Atrial flutter with variable block. HR 70-100s  Radiology/Studies  Dg Chest Port 1 View  05/07/2014   CLINICAL DATA:  Acute respiratory failure  EXAM: PORTABLE CHEST - 1 VIEW  COMPARISON:  05/06/2014  FINDINGS: Cardiac shadow is stable. Postsurgical changes are again seen. An endotracheal tube, nasogastric catheter and right jugular central venous line are again seen and stable in appearance. Minimal persistent changes in the bases are seen stable from the prior exam. No new focal abnormality is seen. No bony abnormality is noted.  IMPRESSION: Tubes and lines as described above.  No significant interval change from the previous day.   Electronically Signed  By: Inez Catalina M.D.   On: 05/07/2014 07:24   Dg Chest Port 1 View  05/06/2014   CLINICAL DATA:  Respiratory acidosis.  EXAM: PORTABLE CHEST - 1 VIEW  COMPARISON:  05/05/2014.  FINDINGS: Endotracheal tube, NG tube, right IJ line stable position. Prior CABG. Heart size stable. Bibasilar pulmonary infiltrates have improved slightly. No prominent pleural effusion. No pneumothorax. No acute bony abnormality.  IMPRESSION: 1. Lines and tubes in stable position. 2. Interim slight improvement of bibasilar pulmonary alveolar infiltrates consistent with partial clearing of pulmonary edema and/or pneumonia. 3. Prior CABG.  Heart size stable.   Electronically Signed   By: Marcello Moores  Register   On: 05/06/2014 07:48   Dg Chest Port 1 View  05/05/2014   CLINICAL DATA:  Acute respiratory failure  EXAM: PORTABLE CHEST - 1 VIEW  COMPARISON:  05/04/2014  FINDINGS:  Cardiomediastinal silhouette is stable. Status post median sternotomy. Stable endotracheal and NG tube position. Right IJ central line is unchanged in position. Persistent mild congestion/pulmonary edema. Probable bilateral small pleural effusion with bilateral basilar atelectasis or infiltrate.  IMPRESSION: Stable support apparatus. Persistent mild congestion/pulmonary edema. Probable bilateral small pleural effusion with bilateral basilar atelectasis or infiltrate.   Electronically Signed   By: Lahoma Crocker M.D.   On: 05/05/2014 08:42   Dg Chest Port 1 View  05/04/2014   CLINICAL DATA:  74 year old male with acute respiratory failure on a ventilator.  EXAM: PORTABLE CHEST - 1 VIEW  COMPARISON:  Chest x-ray 05/03/2014.  FINDINGS: An endotracheal tube is in place with tip 5.9 cm above the carina. There is a right-sided internal jugular central venous catheter with tip terminating in the distal superior vena cava. Transcutaneous pacer/defibrillator pads project over the mid and left hemithorax. A nasogastric tube is seen extending into the stomach, however, the tip of the nasogastric tube extends below the lower margin of the image. Lung volumes appear normal. There is cephalization of the pulmonary vasculature, indistinctness of the interstitial markings, and patchy airspace disease throughout the lungs bilaterally suggestive of moderate pulmonary edema. Small to moderate bilateral pleural effusions. Mild cardiomegaly. The patient is rotated to the right on today's exam, resulting in distortion of the mediastinal contours and reduced diagnostic sensitivity and specificity for mediastinal pathology. Atherosclerosis in the thoracic aorta.  IMPRESSION: 1. Support apparatus, as above. 2. The appearance the chest suggests underlying congestive heart failure, as above. 3. Bibasilar opacities presumably reflect a combination of atelectasis with superimposed small to moderate bilateral pleural effusions, however,  underlying airspace consolidation from infection or aspiration is not excluded.   Electronically Signed   By: Vinnie Langton M.D.   On: 05/04/2014 11:34   Dg Chest Port 1 View  05/03/2014   CLINICAL DATA:  ETT advanced.  Respiratory failure  EXAM: PORTABLE CHEST - 1 VIEW  COMPARISON:  05/02/2014  FINDINGS: The endotracheal tube tip is midway between the clavicular heads and the tracheal carina. The nasogastric tube extends well into the stomach and off the inferior edge of the image. Right jugular central line extends into the SVC about 1 cm below the azygos vein junction.  No pneumothorax is evident. No large effusions are evident. Mild basilar opacities are present, partially cleared on the left where there formerly was a more confluent opacity.  IMPRESSION: Lines and tubes as described.  Slight improvement in the left base.   Electronically Signed   By: Andreas Newport M.D.   On: 05/03/2014 16:16   Dg Chest [facility]  05/02/2014   CLINICAL DATA:  Acute respiratory failure  EXAM: PORTABLE CHEST - 1 VIEW  COMPARISON:  05/01/2014  FINDINGS: Cardiac shadow is stable. An endotracheal tube, nasogastric catheter and right jugular line are again seen and stable in appearance. Postsurgical changes are again noted. Small pleural effusions are seen bilaterally. Stable retrocardiac atelectasis is noted.  IMPRESSION: Stable left retrocardiac atelectasis. Small pleural effusions bilaterally.  Tubes and lines as described.   Electronically Signed   By: Inez Catalina M.D.   On: 05/02/2014 07:14   Portable Chest Xray  05/01/2014   CLINICAL DATA:  Intubation  EXAM: PORTABLE CHEST - 1 VIEW  COMPARISON:  04/30/2014  FINDINGS: Endotracheal tube tip is just below the clavicular heads. The gastric suction tube at least reaches the diaphragm. Right IJ catheter is in stable position. The aortic balloon pump is at the level of the aortic arch, inflated at time of imaging.  Heart size remains normal. There is a persistent  retrocardiac opacity, likely atelectasis and pleural fluid given the myocardial infarction presentation. No pulmonary edema. No pneumothorax.  IMPRESSION: 1. Unchanged positioning of tubes and lines. 2. Stable retrocardiac atelectasis and small effusion.   Electronically Signed   By: Jorje Guild M.D.   On: 05/01/2014 06:41   Dg Chest Port 1 View  04/30/2014   CLINICAL DATA:  Central line placement.  EXAM: PORTABLE CHEST - 1 VIEW  COMPARISON:  None.  FINDINGS: Endotracheal tube, NG tube, right IJ line, mediastinal drainage catheter in stable position. Balloon pump noted with tip at upper portion of descending thoracic aorta. Prior CABG. Left lower lobe infiltrate. Small left pleural effusion. No acute bony abnormality.  IMPRESSION: 1. Interim placement of right IJ line. Tip in good anatomic position. Remaining lines and tubes in stable position. Balloon pump tip at the upper portion of the descending thoracic aorta. 2. Mild left lower lobe infiltrate and small left pleural effusion.   Electronically Signed   By: Marcello Moores  Register   On: 04/30/2014 16:47   Portable Chest Xray  04/30/2014   CLINICAL DATA:  Intubation  EXAM: PORTABLE CHEST - 1 VIEW  COMPARISON:  07/21/2009  FINDINGS: Endotracheal tube tip in good position at the clavicular heads. Gastric suction tube is short, as described on abdominal radiography. Interval advancement of aortic balloon pump since previous KUB, tip at the aortic arch. There is a temporary pacer wire from below with lead overlapping ventricles, presumably right ventricle.  No cardiomegaly. Aortic contours are negative. The patient has undergone CABG.  Mild perihilar atelectasis.  No edema or effusion.  No pneumothorax.  IMPRESSION: 1. Unremarkable appearance of endotracheal tube, aortic balloon pump, and temporary pacer wire. 2. Short nasogastric tube, reference KUB. 3. Perihilar atelectasis.  No pulmonary edema.   Electronically Signed   By: Jorje Guild M.D.   On: 04/30/2014  02:48   Dg Abd Portable 1v  04/30/2014   CLINICAL DATA:  Nasogastric tube repositioning  EXAM: PORTABLE ABDOMEN - 1 VIEW  COMPARISON:  KUB from the same day at 2:14 a.m.  FINDINGS: The nasogastric tube has been advanced into the stomach, with tip in the distal body or antrum. The temporary pacer wire is in stable position, tip over the right ventricle.  IMPRESSION: 1. Nasogastric tube tip has advanced into the distal stomach. 2. Temporary pacer lead over the right ventricle.   Electronically Signed   By: Jorje Guild M.D.   On: 04/30/2014 04:27   Dg Abd Portable 1v  04/30/2014  CLINICAL DATA:  Encounter for nasogastric tube  EXAM: PORTABLE ABDOMEN - 1 VIEW  COMPARISON:  Abdominal CT 11/04/2009  FINDINGS: Nasogastric tube tip is at the proximal stomach, with side port at the lower esophagus. Advancement by 8 cm would provide more secure positioning.  There is an arterial sheath in the left groin with aortic balloon pump at the level of L2. This is been advanced subsequent chest x-ray. Temporary pacer wire via right groin, tip not visualized.  These results were called by telephone at the time of interpretation on 04/30/2014 at 2:45 am to Triana who verbally acknowledged these results.  IMPRESSION: Nasogastric tube tip at the gastric cardia. Advancement by 8 cm would provide more secure positioning.   Electronically Signed   By: Jorje Guild M.D.   On: 04/30/2014 02:45     2D ECHO: 04/30/2014 LV EF: 45% -   50% Study Conclusions - Left ventricle: inferobasal and posterior lateral hypokinesis.   The cavity size was normal. Wall thickness was normal. Systolic   function was mildly reduced. The estimated ejection fraction was   in the range of 45% to 50%. - Atrial septum: No defect or patent foramen ovale was identified. - Pulmonary arteries: PA peak pressure: 38 mm Hg (S).    ASSESSMENT AND PLAN [name] is a 74 year old gentleman with a history of CAD s/p CABG x2, TIA, PVD, HLD,  COPD, tobacco abuse, who [facility] to [facility] with an inferior posterior ST segment elevation myocardial infarction- this was c/b CHB and ventricular tachycardia arrest. Cardiac cath revealed left dominant circulation. He he had an atretic internal mammary artery. The saphenous vein graft to the OM was occluded. He required temporary venous pacemaker and an intra-aortic balloon pump for cardiogenic shock. At that time he was felt not to be a good candidate for redo CABG. Dr. Martinique was able to angioplasty the left main and left circumflex with minimal improvement.  CAD/STEMI- -- The patient was found have severe 3V CAD. Dr. Martinique was able to open the left main slightly and was able to open the circumflex artery slightly. He was unable to wire the left anterior descending artery because of tortuosity and an acute angle.  -- We will continue with current medical therapy.  -- He has been seen by Dr. Servando Snare and was thought to be a poor candidate for CABG at this time bc of significance comorbities. STS risk mortality >25% and risk or mortality morbidity 75%. However, now that he is improving and ambulatory this may be reconsidered. Per a discussion with Dr. Servando Snare, if he continues to improve, a referall can be made an an outpatient.  -- Added low dose carvedilol at 1/2 of 3.125 bid today and tolerating this well. May be a candidate for ranolazine if BP remains low.   Paroxysmal atrial fib/flutter- he converted back into atral flutter sometimes last night. Tele reveals atrial flutter with variable block. HR 70-100s. Rates generally well controlled.   -- Continue amiodarone 200mg  BID  -- CHADS 2 VASC of 6 ( age of 69, hx of CVA, HTN, DM). Started on Eliquis and DC'd ASA since he is on plavix   Acute on chronic systolic CHF w/ pulmonary edema- has been on IV lasix 40mg  BID. No more SOB. Net net 1.5L. Will discontinued IV lasix and convert to 20 mg po Lasix. BMET today pending. -- EF  45-50%  Cardiogenic shock-  BP has been stable. He is off pressors. The IABP has been  pulled. Extubated.  VT (ventricular tachycardia): Converted to 200mg  BID PO amio. No further episodes  CHB (complete heart block): He responded to atropine. Now resolved.  Hypokalemia:resolved. BMET today pending  Signed, Nathan Stanford PA-C  Pager [phone]   Patient seen and examined. Agree with assessment and plan. More alert; less coughing. Now back in A flutter with ventricular rate in 80's.  Continue amiodarone. Will increase carvedilol to 3.125 mg bid today.  Will also add ranolazine 500 mg bid with significant CAD and may reduce incidence of AF; will modify atorvastatin to 40 mg with ranolazine initiation. I spoke with Dr. Servando Snare. Pt has significantly improved from last week. If he continues to stabilize and strenghten with outpatient re-assessment may ultimately be candidate for re-do CABG.   Nathan Sine, MD, [facility] 05/11/2014 9:13 AM

## 2014-05-11 NOTE — Clinical Social Work Note (Signed)
CSW provided family with bed offers- family and pt choose [facility]- bed available.  CSW will continue to follow.  Domenica Reamer, [place] Social Worker [phone]

## 2014-05-12 LAB — CBC
HCT: 35.1 % — ABNORMAL LOW (ref 39.0–52.0)
Hemoglobin: 11.8 g/dL — ABNORMAL LOW (ref 13.0–17.0)
MCH: 32.5 pg (ref 26.0–34.0)
MCHC: 33.6 g/dL (ref 30.0–36.0)
MCV: 96.7 fL (ref 78.0–100.0)
Platelets: 542 10*3/uL — ABNORMAL HIGH (ref 150–400)
RBC: 3.63 MIL/uL — ABNORMAL LOW (ref 4.22–5.81)
RDW: 13 % (ref 11.5–15.5)
WBC: 7.7 10*3/uL (ref 4.0–10.5)

## 2014-05-12 LAB — BASIC METABOLIC PANEL
Anion gap: 9 (ref 5–15)
BUN: 27 mg/dL — AB (ref 6–23)
CO2: 25 mmol/L (ref 19–32)
Calcium: 8.6 mg/dL (ref 8.4–10.5)
Chloride: 100 mmol/L (ref 96–112)
Creatinine, Ser: 1.21 mg/dL (ref 0.50–1.35)
GFR calc non Af Amer: 57 mL/min — ABNORMAL LOW (ref >90)
GFR, EST AFRICAN AMERICAN: 66 mL/min — AB (ref >90)
GLUCOSE: 101 mg/dL — AB (ref 70–99)
Potassium: 4.2 mmol/L (ref 3.5–5.1)
Sodium: 134 mmol/L — ABNORMAL LOW (ref 135–145)

## 2014-05-12 MED ORDER — ENSURE COMPLETE PO LIQD
237.0000 mL | Freq: Two times a day (BID) | ORAL | Status: DC
  Administered 2014-05-12 – 2014-05-14 (×4): 237 mL via ORAL

## 2014-05-12 NOTE — Progress Notes (Signed)
Dear Doctor: This patient has been identified as a candidate for PICC for the following reason (s): poor veins/poor circulatory system (CHF, COPD, emphysema, diabetes, steroid use, IV drug abuse, etc.) Current central line has a port that's occluded and the central line has been in for longer than the recommended 7 days.  If you agree, please write an order for the indicated device. For any questions contact the Vascular Access Team at [phone] if no answer, please leave a message.  Thank you for supporting the early vascular access assessment program. Catalina Pizza

## 2014-05-12 NOTE — Clinical Social Work Note (Signed)
CSW spoke with pt son Broad Ohanian jr) to discuss pt and pt wife situation.  Pt son is concerned about what will happen to pt wife if pt is no longer able to care for her due to medical issues.  Pt wife has Alzheimers and is incapable of independent living (pt son is living with the wife while pt is in hospital).  CSW provided pt son with list of ALF and  Memory care units (pt son reports finances not to be a barrier to placement but is concerned about pt wife or pt being willing to go to ALF.  CSW will continue to follow.  Domenica Reamer, [place] Social Worker [phone]

## 2014-05-12 NOTE — Progress Notes (Signed)
Patient Name: [name] Date of Encounter: 05/12/2014  Active Problems:   STEMI (ST elevation myocardial infarction)   Cardiogenic shock   Cardiac arrest   VT (ventricular tachycardia)   CHB (complete heart block)   CAD (coronary artery disease), native coronary artery   Acute respiratory failure   S/P CABG x 2   Acute respiratory acidosis   Primary Cardiologist:  Patient Profile: 74 yo male w/ hx CABG,TIA, PVD, HLD, COPD, tobacco abuse, admitted 01/21 w/ STEMI, CHB w/ temp pacer, VT arrest. Req IABP 2nd CGS, had PTCA [place] & CFX. Also w/ PAF. He is now DNR.  SUBJECTIVE: Ate OK, wanting to walk today, breathing OK  OBJECTIVE Filed Vitals:   05/11/14 1631 05/11/14 2020 05/11/14 2215 05/12/14 0300  BP: 113/42 96/35 110/70 124/87  Pulse: 99 65 74 81  Temp:  98.4 F (36.9 C)    TempSrc:  Oral    Resp:  18 18 18   Height:      Weight:      SpO2:  94% 99% 97%    Intake/Output Summary (Last 24 hours) at 05/12/14 0929 Last data filed at 05/12/14 0840  Gross per 24 hour  Intake    360 ml  Output   1676 ml  Net  -1316 ml   Filed Weights   05/07/14 0500 05/08/14 0104 05/10/14 0454  Weight: 173 lb 4.5 oz (78.6 kg) 165 lb 9.1 oz (75.1 kg) 158 lb 11.7 oz (72 kg)    PHYSICAL EXAM General: Well developed, well nourished, male in no acute distress. Head: Normocephalic, atraumatic.  Neck: Supple without bruits, JVD not elevated. Lungs:  Resp regular and unlabored, decreased BS bases. Heart: Irreg Irreg, S1, S2, no S3, S4, or murmur; no rub. Abdomen: Soft, non-tender, non-distended, BS + x 4.  Extremities: No clubbing, cyanosis, no edema.  Neuro: Alert and oriented X 3. Moves all extremities spontaneously. Psych: Normal affect.  LABS: CBC: Recent Labs  05/11/14 0910 05/12/14 0515  WBC 8.6 7.7  HGB 11.6* 11.8*  HCT 34.5* 35.1*  MCV 96.6 96.7  PLT 524* 542*    Basic Metabolic Panel: Recent Labs  05/11/14 0910 05/12/14 0515  NA 135 134*  K 3.8 4.2    CL 100 100  CO2 28 25  GLUCOSE 178* 101*  BUN 31* 27*  CREATININE 1.33 1.21  CALCIUM 9.0 8.6    TELE:  Atrial flutter, RVR at times  Current Medications:  . amiodarone  200 mg Oral BID  . antiseptic oral rinse  7 mL Mouth Rinse q12n4p  . apixaban  5 mg Oral BID  . atorvastatin  40 mg Per Tube q1800  . carvedilol  3.125 mg Oral BID WC  . chlorhexidine  15 mL Mouth Rinse BID  . clopidogrel  75 mg Oral Daily  . feeding supplement (PRO-STAT SUGAR FREE 64)  30 mL Oral Daily  . furosemide  20 mg Oral Daily  . polyethylene glycol  17 g Per Tube BID  . potassium chloride  40 mEq Oral BID  . ranolazine  500 mg Oral BID  . traZODone  50 mg Oral QHS   . sodium chloride Stopped (05/11/14 1228)    ASSESSMENT AND PLAN: CAD/STEMI- -- The patient was found have severe 3V CAD. Dr. Martinique was able to open the left main slightly and was able to open the circumflex artery slightly. He was unable to wire the left anterior descending artery because of tortuosity and an acute  angle.  -- We will continue with current medical therapy.  -- He has been seen by Dr. Servando Snare and was thought to be a poor candidate for CABG at this time bc of significance comorbities. STS risk mortality >25% and risk or mortality morbidity 75%.  Per a discussion with Dr. Servando Snare, if he continues to improve, a referall can be made an an outpatient.  -- Added low dose carvedilol at 1/2 of 3.125 bid 02/02 and tolerating this well. Ranolazine added 02/02  Paroxysmal atrial fib/flutter- he converted back into atral flutter 02/01. Tele reveals atrial flutter with variable block. HR 70-120s. Rates generally well controlled.  -- On amiodarone 200mg  BID, MD advise on increasing to 400 mg bid -- CHADS 2 VASC of 6 ( age of 36, hx of CVA, HTN, DM). Started on Eliquis and DC'd ASA since he is on plavix   Acute on chronic systolic CHF w/ pulmonary edema -- Peak weight 185, was on IV lasix 40mg  BID x 5 days. No more SOB. I/O net  2.7L. Changed to 20 mg po daily Lasix 02/02. BMET OK. -- EF 45-50%  Cardiogenic shock- BP has been stable. He is off pressors. The IABP has been pulled. Extubated.  VT (ventricular tachycardia): Converted to 200mg  BID PO amio. No further episodes  CHB (complete heart block): He responded to atropine. Now resolved.  Hypokalemia:resolved. BMET OK   Signed, Rosaria Ferries , PA-C 9:29 AM 05/12/2014   Patient seen and examined. Agree with assessment and plan. Continues to improve; Tolerating initiation of ranolazine. No chest pain. Will further increase amiodarone to 300 mg bid with on going A flutter. Pt on plavix and eliquis with recent PCI and arrrythmias.    Troy Sine, MD, [facility] 05/12/2014 10:15 AM

## 2014-05-12 NOTE — Progress Notes (Signed)
Physical Therapy Treatment Patient Details Name: [name] MRN: [id number] DOB: 11/02/1940 Today's Date: 05/12/2014    History of Present Illness [name] with history of CAD s/p CABG 1993(unclear anatomy), PVD s/p PCI to left SFA and right SFA, hyperlipidemia, who presented to ED via EMS for inf-posterior STEMI and complete HB. Pt with extensive cardiac history.    PT Comments    Pt progressing with mobility, ambulated 250' with RW and min A, several LOB. Confusion persists. PT will continue to follow.   Follow Up Recommendations  SNF     Equipment Recommendations  Rolling walker with 5" wheels    Recommendations for Other Services       Precautions / Restrictions Precautions Precautions: Fall Precaution Comments: current balance impairment= elevated fall risk Restrictions Weight Bearing Restrictions: No    Mobility  Bed Mobility               General bed mobility comments: pt received in chair  Transfers Overall transfer level: Needs assistance Equipment used: Rolling walker (2 wheeled) Transfers: Sit to/from Stand Sit to Stand: Supervision         General transfer comment: vc's for hand placement, no physical assist needed  Ambulation/Gait Ambulation/Gait assistance: Min assist Ambulation Distance (Feet): 250 Feet Assistive device: Rolling walker (2 wheeled) Gait Pattern/deviations: Step-through pattern;Staggering left Gait velocity: decreased Gait velocity interpretation: Below normal speed for age/gender General Gait Details: pt able to ambulate without rest breaks today and tolerated increased distance. However, very unsteady upon initial standing with post left LOB with min A to correct and then very unsteady again last 68' when he became very fatigued. Pt somewhat aware of balance deficits, reports that he knows he needs RW.    Stairs            Wheelchair Mobility    Modified Rankin (Stroke Patients Only)       Balance  Overall balance assessment: Needs assistance Sitting-balance support: No upper extremity supported Sitting balance-Leahy Scale: Fair     Standing balance support: Bilateral upper extremity supported Standing balance-Leahy Scale: Poor Standing balance comment: loses balance to left when not holding on                    Cognition Arousal/Alertness: Awake/alert Behavior During Therapy: WFL for tasks assessed/performed Overall Cognitive Status: Impaired/Different from baseline Area of Impairment: Orientation;Problem solving;Safety/judgement;Memory Orientation Level: Disoriented to;Time;Situation (pt thinks it's 10pm instead of 10am)   Memory: Decreased short-term memory   Safety/Judgement: Decreased awareness of deficits   Problem Solving: Slow processing;Difficulty sequencing General Comments: pt thinks he is at the [place] until redirected    Exercises General Exercises - Lower Extremity Ankle Circles/Pumps: AROM;Both;10 reps;Seated Long Arc Quad: AROM;Both;10 reps;Seated (with manual resistance) Heel Raises: AROM;Both;10 reps;Standing    General Comments        Pertinent Vitals/Pain Pain Assessment: No/denies pain  O2 sats 98% on RA, HR 68 bpm after ambulation    Home Living                      Prior Function            PT Goals (current goals can now be found in the care plan section) Acute Rehab PT Goals Patient Stated Goal: home PT Goal Formulation: With patient Time For Goal Achievement: 05/17/14 Potential to Achieve Goals: Good Progress towards PT goals: Progressing toward goals    Frequency  Min 3X/week    PT  Plan Current plan remains appropriate    Co-evaluation             End of Session Equipment Utilized During Treatment: Gait belt Activity Tolerance: Patient tolerated treatment well Patient left: in chair;with call bell/phone within reach;with family/visitor present;with chair alarm set     Time: 0937-1000 PT Time  Calculation (min) (ACUTE ONLY): 23 min  Charges:  $Gait Training: 23-37 mins                    G Codes:     Leighton Roach, PT  Acute Rehab Services  [phone]  Leighton Roach 05/12/2014, 12:21 PM

## 2014-05-12 NOTE — Progress Notes (Signed)
NUTRITION FOLLOW UP  Intervention:   Ensure Complete BID  Nutrition Dx:   Inadequate oral intake related to lack of appetite as evidenced by eating only 25-50% of meals  Goal:   Pt to meet >/= 90% of estimated nutrition needs; unmet.   Monitor:   Oral intake, weight, labs  Assessment:   Pt admitted with chest pain, SOB, n/v.  Was found to have bradycardia and acute inferior STEMI and was taken to cath lab.  Had worsening cardiogenic shock requiring temp pacer wire and IABP, pt intubated.  Hx of CAD, CABG 1993, PVD, TIA, peripheral neuropathy. Pt now extubated and eating normal diet  Pt says he is eating well, however Online documentation suggests he is eating only 25%-50%  of meals. Pt was ok with receiving ensure.   Pt complaining of slight nausea over the last couple days. Pt complained of severe constipation miralax/dulcolax ordered  Body mass index is 22.15 kg/(m^2).  Height: Ht Readings from Last 1 Encounters:  05/08/14 5\' 11"  (1.803 m)    Weight Status:   Wt Readings from Last 1 Encounters:  05/10/14 158 lb 11.7 oz (72 kg)  Admission Weight: 171lbs   Re-estimated needs:  Kcal: 1600-1800 Protein: 80-94 gm protein Fluid:>1.6 liters  Skin: ecchymosis on arms  Diet Order: Diet Heart   Intake/Output Summary (Last 24 hours) at 05/12/14 1156 Last data filed at 05/12/14 0900  Gross per 24 hour  Intake    360 ml  Output   1877 ml  Net  -1517 ml   Intake: Pt has had highly variable intake.:Now eating 25-50% of meals.   Last BM: none documented, MD ordered miralax and dulcolax     Labs:   Recent Labs Lab 05/06/14 0420  05/10/14 0510 05/11/14 0910 05/12/14 0515  NA 136  < > 139 135 134*  K 4.0  < > 3.8 3.8 4.2  CL 98  < > 102 100 100  CO2 31  < > 30 28 25   BUN 22  < > 30* 31* 27*  CREATININE 1.02  < > 1.24 1.33 1.21  CALCIUM 8.3*  < > 8.8 9.0 8.6  MG 2.1  --   --   --   --   PHOS 2.8  --   --   --   --   GLUCOSE 152*  < > 130* 178* 101*  < > =  values in this interval not displayed.  CBG (last 3)  No results for input(s): GLUCAP in the last 72 hours.  Scheduled Meds: . amiodarone  200 mg Oral BID  . antiseptic oral rinse  7 mL Mouth Rinse q12n4p  . apixaban  5 mg Oral BID  . atorvastatin  40 mg Per Tube q1800  . carvedilol  3.125 mg Oral BID WC  . chlorhexidine  15 mL Mouth Rinse BID  . clopidogrel  75 mg Oral Daily  . feeding supplement (PRO-STAT SUGAR FREE 64)  30 mL Oral Daily  . furosemide  20 mg Oral Daily  . polyethylene glycol  17 g Per Tube BID  . potassium chloride  40 mEq Oral BID  . ranolazine  500 mg Oral BID  . traZODone  50 mg Oral QHS    Continuous Infusions: . sodium chloride Stopped (05/11/14 1228)    Burtis Junes RD, LDN Nutrition [phone] 05/12/2014 11:56 AM

## 2014-05-12 NOTE — Progress Notes (Signed)
CARDIAC REHAB PHASE I   PRE:  Rate/Rhythm: 66 a flutter  BP:  Supine: 102/50  Sitting:   Standing:    SaO2: 96%RA  MODE:  Ambulation: 100 ft   POST:  Rate/Rhythm: 89 a flutter  BP:  Supine: 103/43  Sitting:   Standing:    SaO2: 98%RA 1500-1525 Pt walked 100 ft on RA with gait belt use and rolling walker. Cut walk short since pt c/o feeling dizzy. Tired this afternoon. Back to bed after walk. Family in room. Denied CP. Stayed close to rolling walker and took turn safely.   Graylon Good, RN BSN  05/12/2014 3:24 PM   6

## 2014-05-13 LAB — CBC
HCT: 32.8 % — ABNORMAL LOW (ref 39.0–52.0)
Hemoglobin: 11.5 g/dL — ABNORMAL LOW (ref 13.0–17.0)
MCH: 33 pg (ref 26.0–34.0)
MCHC: 35.1 g/dL (ref 30.0–36.0)
MCV: 94.3 fL (ref 78.0–100.0)
Platelets: 504 10*3/uL — ABNORMAL HIGH (ref 150–400)
RBC: 3.48 MIL/uL — ABNORMAL LOW (ref 4.22–5.81)
RDW: 12.9 % (ref 11.5–15.5)
WBC: 8 10*3/uL (ref 4.0–10.5)

## 2014-05-13 LAB — BASIC METABOLIC PANEL
Anion gap: 9 (ref 5–15)
BUN: 23 mg/dL (ref 6–23)
CALCIUM: 8.6 mg/dL (ref 8.4–10.5)
CO2: 22 mmol/L (ref 19–32)
CREATININE: 1.25 mg/dL (ref 0.50–1.35)
Chloride: 106 mmol/L (ref 96–112)
GFR calc Af Amer: 63 mL/min — ABNORMAL LOW (ref >90)
GFR, EST NON AFRICAN AMERICAN: 55 mL/min — AB (ref >90)
Glucose, Bld: 106 mg/dL — ABNORMAL HIGH (ref 70–99)
Potassium: 4.3 mmol/L (ref 3.5–5.1)
Sodium: 137 mmol/L (ref 135–145)

## 2014-05-13 MED ORDER — AMIODARONE HCL 200 MG PO TABS
300.0000 mg | ORAL_TABLET | Freq: Two times a day (BID) | ORAL | Status: DC
  Administered 2014-05-13 – 2014-05-14 (×2): 300 mg via ORAL
  Filled 2014-05-13 (×3): qty 1

## 2014-05-13 NOTE — Progress Notes (Signed)
CARDIAC REHAB PHASE I   PRE:  Rate/Rhythm: 98 afib  BP:  Supine:   Sitting: 95/59  Standing:    SaO2: 98%RA  MODE:  Ambulation: 140 ft   POST:  Rate/Rhythm: 107 afib  BP:  Supine:   Sitting: 97/47  Standing:    SaO2: 99%RA 0850-0915 Pt upset that he did not get much rest last night. Stated noise outside of door. Stated tired this morning. Did get him to walk 140 ft with gait belt use, rolling walker and asst x1.  Wanted to cut walk short. To recliner with chair alarm after walk. Visitor in to see pt.    Graylon Good, RN BSN  05/13/2014 9:13 AM

## 2014-05-13 NOTE — Progress Notes (Signed)
Patient Name: [name] Date of Encounter: 05/13/2014  Active Problems:   STEMI (ST elevation myocardial infarction)   Cardiogenic shock   Cardiac arrest   VT (ventricular tachycardia)   CHB (complete heart block)   CAD (coronary artery disease), native coronary artery   Acute respiratory failure   S/P CABG x 2   Acute respiratory acidosis   Primary Cardiologist: Dr. Angelena Form  Patient Profile: 74 yo male w/ hx CABG,TIA, PVD, HLD, COPD, tobacco abuse, admitted 01/21 w/ STEMI, CHB w/ temp pacer, VT arrest. Req IABP 2nd CGS, had PTCA [place] & CFX. Also w/ PAF. He is now DNR.  SUBJECTIVE: Feels a little different, a little "fuzzy" but denies presyncope, ambulation increasing gradually  OBJECTIVE Filed Vitals:   05/12/14 0300 05/12/14 1300 05/12/14 2013 05/13/14 0344  BP: 124/87 97/47 123/75 110/65  Pulse: 81 89 66 64  Temp:  98 F (36.7 C) 98 F (36.7 C) 97.6 F (36.4 C)  TempSrc:  Oral Oral Oral  Resp: 18 18 18 18   Height:      Weight:    153 lb 3.5 oz (69.5 kg)  SpO2: 97% 100% 98% 98%    Intake/Output Summary (Last 24 hours) at 05/13/14 1110 Last data filed at 05/13/14 0800  Gross per 24 hour  Intake    480 ml  Output    550 ml  Net    -70 ml   Filed Weights   05/08/14 0104 05/10/14 0454 05/13/14 0344  Weight: 165 lb 9.1 oz (75.1 kg) 158 lb 11.7 oz (72 kg) 153 lb 3.5 oz (69.5 kg)    PHYSICAL EXAM General: Well developed, well nourished, male in no acute distress. Head: Normocephalic, atraumatic.  Neck: Supple without bruits, JVD not elevated. Lungs:  Resp regular and unlabored, decreased BS bases. Heart: Irreg irreg, S1, S2, no S3, S4, or murmur; no rub. Abdomen: Soft, non-tender, non-distended, BS + x 4.  Extremities: No clubbing, cyanosis, no edema.  Neuro: Alert and oriented X 3. Moves all extremities spontaneously. Psych: Normal affect.  LABS: CBC:  Recent Labs  05/12/14 0515 05/13/14 0500  WBC 7.7 8.0  HGB 11.8* 11.5*  HCT 35.1*  32.8*  MCV 96.7 94.3  PLT 542* 347*   Basic Metabolic Panel:  Recent Labs  05/12/14 0515 05/13/14 0500  NA 134* 137  K 4.2 4.3  CL 100 106  CO2 25 22  GLUCOSE 101* 106*  BUN 27* 23  CREATININE 1.21 1.25  CALCIUM 8.6 8.6   TELE:  Atrial flutter, rate generally controlled    Current Medications:  . amiodarone  300 mg Oral BID  . antiseptic oral rinse  7 mL Mouth Rinse q12n4p  . apixaban  5 mg Oral BID  . atorvastatin  40 mg Per Tube q1800  . carvedilol  3.125 mg Oral BID WC  . chlorhexidine  15 mL Mouth Rinse BID  . clopidogrel  75 mg Oral Daily  . feeding supplement (ENSURE COMPLETE)  237 mL Oral BID BM  . furosemide  20 mg Oral Daily  . polyethylene glycol  17 g Per Tube BID  . potassium chloride  40 mEq Oral BID  . ranolazine  500 mg Oral BID  . traZODone  50 mg Oral QHS   . sodium chloride Stopped (05/11/14 1228)     ASSESSMENT AND PLAN: CAD/STEMI- -- The patient was found have severe 3V CAD. Dr. Martin[place] was able to open the left main slightly and was able  to open the circumflex artery slightly. He was unable to wire the left anterior descending artery because of tortuosity and an acute angle.  -- We will continue with current medical therapy.  -- He has been seen by Dr. Servando Snare and was thought to be a poor candidate for CABG at this time bc of significance comorbities. STS risk mortality >25% and risk or mortality morbidity 75%. Per a discussion with Dr. Servando Snare, if he continues to improve, a referall can be made an an outpatient.  -- Added low dose carvedilol at 3.125 bid 02/03 and tolerating this well. Ranolazine added 02/02 -- No chest pain w/ ambulation  Paroxysmal atrial fib/flutter- he converted back into atral flutter 02/01. Tele reveals atrial flutter with variable block. HR 70-120s. Rates generally well controlled.  -- On amiodarone 300mg  BID -- CHADS 2 VASC of 6 ( age of 74, hx of CVA, HTN, DM). Started on Eliquis and DC'd ASA since he is on  Plavix   Acute on chronic systolic CHF w/ pulmonary edema  -- Peak weight 185, was on IV lasix 40mg  BID x 5 days. No more SOB. I/O net 2.7L. Changed to 20 mg po daily Lasix 02/02. BMET OK. -- EF 45-50%  Cardiogenic shock- BP has been stable. He is off pressors. The IABP has been pulled. Extubated.  VT (ventricular tachycardia): Converted, on 300mg  BID PO amio. No further episodes  CHB (complete heart block): He responded to atropine. Now resolved.  Hypokalemia:resolved. BMET OK  Signed, Rosaria Ferries , PA-C 11:10 AM 05/13/2014    Patient seen and examined. Agree with assessment and plan. No chest pain; Hr improved on increased medical therapy. ECG today confirms A Flutter with variable block with predominant 4:1 conduction.  Now on amio increased to 300 mg bid yesterday. On eliquis for anticoagulation and plavix with recent PCI. Hopefully will pharmacologically cardiovert, but if does not ? DC cardiovert prior to rehab. Will check ECG in am.   Troy Sine, MD, [facility] 05/13/2014 12:45 PM

## 2014-05-13 NOTE — Progress Notes (Signed)
Medicare Important Message given? YES  (If response is "NO", the following Medicare IM given date fields will be blank)  Date Medicare IM given: 05/13/14 Medicare IM given by:  Dahlia Client [employer]

## 2014-05-14 ENCOUNTER — Other Ambulatory Visit: Payer: Self-pay | Admitting: Physician Assistant

## 2014-05-14 ENCOUNTER — Other Ambulatory Visit: Payer: Self-pay

## 2014-05-14 ENCOUNTER — Encounter (HOSPITAL_COMMUNITY): Payer: Self-pay | Admitting: Physician Assistant

## 2014-05-14 ENCOUNTER — Encounter: Payer: Self-pay | Admitting: Internal Medicine

## 2014-05-14 ENCOUNTER — Non-Acute Institutional Stay (SKILLED_NURSING_FACILITY): Payer: Medicare Other | Admitting: Internal Medicine

## 2014-05-14 DIAGNOSIS — I472 Ventricular tachycardia, unspecified: Secondary | ICD-10-CM

## 2014-05-14 DIAGNOSIS — R040 Epistaxis: Secondary | ICD-10-CM | POA: Diagnosis not present

## 2014-05-14 DIAGNOSIS — J9811 Atelectasis: Secondary | ICD-10-CM | POA: Diagnosis not present

## 2014-05-14 DIAGNOSIS — I2101 ST elevation (STEMI) myocardial infarction involving left main coronary artery: Secondary | ICD-10-CM | POA: Diagnosis not present

## 2014-05-14 DIAGNOSIS — Z6822 Body mass index (BMI) 22.0-22.9, adult: Secondary | ICD-10-CM | POA: Diagnosis not present

## 2014-05-14 DIAGNOSIS — D62 Acute posthemorrhagic anemia: Secondary | ICD-10-CM | POA: Diagnosis not present

## 2014-05-14 DIAGNOSIS — I2119 ST elevation (STEMI) myocardial infarction involving other coronary artery of inferior wall: Secondary | ICD-10-CM | POA: Diagnosis not present

## 2014-05-14 DIAGNOSIS — I25119 Atherosclerotic heart disease of native coronary artery with unspecified angina pectoris: Secondary | ICD-10-CM | POA: Diagnosis present

## 2014-05-14 DIAGNOSIS — W06XXXA Fall from bed, initial encounter: Secondary | ICD-10-CM | POA: Diagnosis not present

## 2014-05-14 DIAGNOSIS — I2510 Atherosclerotic heart disease of native coronary artery without angina pectoris: Secondary | ICD-10-CM | POA: Diagnosis not present

## 2014-05-14 DIAGNOSIS — G9340 Encephalopathy, unspecified: Secondary | ICD-10-CM | POA: Insufficient documentation

## 2014-05-14 DIAGNOSIS — R2681 Unsteadiness on feet: Secondary | ICD-10-CM | POA: Diagnosis not present

## 2014-05-14 DIAGNOSIS — E785 Hyperlipidemia, unspecified: Secondary | ICD-10-CM | POA: Diagnosis present

## 2014-05-14 DIAGNOSIS — J9600 Acute respiratory failure, unspecified whether with hypoxia or hypercapnia: Secondary | ICD-10-CM | POA: Diagnosis not present

## 2014-05-14 DIAGNOSIS — I10 Essential (primary) hypertension: Secondary | ICD-10-CM | POA: Diagnosis present

## 2014-05-14 DIAGNOSIS — Y92230 Patient room in hospital as the place of occurrence of the external cause: Secondary | ICD-10-CM | POA: Diagnosis not present

## 2014-05-14 DIAGNOSIS — Z79899 Other long term (current) drug therapy: Secondary | ICD-10-CM | POA: Diagnosis not present

## 2014-05-14 DIAGNOSIS — R1312 Dysphagia, oropharyngeal phase: Secondary | ICD-10-CM | POA: Diagnosis not present

## 2014-05-14 DIAGNOSIS — Z9861 Coronary angioplasty status: Secondary | ICD-10-CM | POA: Diagnosis not present

## 2014-05-14 DIAGNOSIS — K921 Melena: Secondary | ICD-10-CM | POA: Diagnosis not present

## 2014-05-14 DIAGNOSIS — I2111 ST elevation (STEMI) myocardial infarction involving right coronary artery: Secondary | ICD-10-CM | POA: Diagnosis not present

## 2014-05-14 DIAGNOSIS — E43 Unspecified severe protein-calorie malnutrition: Secondary | ICD-10-CM | POA: Diagnosis present

## 2014-05-14 DIAGNOSIS — I483 Typical atrial flutter: Secondary | ICD-10-CM | POA: Diagnosis not present

## 2014-05-14 DIAGNOSIS — R488 Other symbolic dysfunctions: Secondary | ICD-10-CM | POA: Diagnosis not present

## 2014-05-14 DIAGNOSIS — I213 ST elevation (STEMI) myocardial infarction of unspecified site: Secondary | ICD-10-CM | POA: Diagnosis not present

## 2014-05-14 DIAGNOSIS — G629 Polyneuropathy, unspecified: Secondary | ICD-10-CM | POA: Diagnosis present

## 2014-05-14 DIAGNOSIS — I480 Paroxysmal atrial fibrillation: Secondary | ICD-10-CM | POA: Diagnosis not present

## 2014-05-14 DIAGNOSIS — T82898A Other specified complication of vascular prosthetic devices, implants and grafts, initial encounter: Secondary | ICD-10-CM | POA: Diagnosis present

## 2014-05-14 DIAGNOSIS — R9431 Abnormal electrocardiogram [ECG] [EKG]: Secondary | ICD-10-CM | POA: Diagnosis not present

## 2014-05-14 DIAGNOSIS — G609 Hereditary and idiopathic neuropathy, unspecified: Secondary | ICD-10-CM | POA: Diagnosis present

## 2014-05-14 DIAGNOSIS — Z951 Presence of aortocoronary bypass graft: Secondary | ICD-10-CM

## 2014-05-14 DIAGNOSIS — I240 Acute coronary thrombosis not resulting in myocardial infarction: Secondary | ICD-10-CM | POA: Diagnosis not present

## 2014-05-14 DIAGNOSIS — I6789 Other cerebrovascular disease: Secondary | ICD-10-CM | POA: Diagnosis not present

## 2014-05-14 DIAGNOSIS — I442 Atrioventricular block, complete: Secondary | ICD-10-CM | POA: Diagnosis not present

## 2014-05-14 DIAGNOSIS — I5023 Acute on chronic systolic (congestive) heart failure: Secondary | ICD-10-CM | POA: Diagnosis not present

## 2014-05-14 DIAGNOSIS — R570 Cardiogenic shock: Secondary | ICD-10-CM

## 2014-05-14 DIAGNOSIS — I951 Orthostatic hypotension: Secondary | ICD-10-CM | POA: Diagnosis not present

## 2014-05-14 DIAGNOSIS — I4892 Unspecified atrial flutter: Secondary | ICD-10-CM | POA: Diagnosis present

## 2014-05-14 DIAGNOSIS — Z8546 Personal history of malignant neoplasm of prostate: Secondary | ICD-10-CM | POA: Diagnosis not present

## 2014-05-14 DIAGNOSIS — Z7901 Long term (current) use of anticoagulants: Secondary | ICD-10-CM | POA: Diagnosis not present

## 2014-05-14 DIAGNOSIS — J9601 Acute respiratory failure with hypoxia: Secondary | ICD-10-CM

## 2014-05-14 DIAGNOSIS — F17210 Nicotine dependence, cigarettes, uncomplicated: Secondary | ICD-10-CM | POA: Diagnosis present

## 2014-05-14 DIAGNOSIS — R310 Gross hematuria: Secondary | ICD-10-CM | POA: Diagnosis present

## 2014-05-14 DIAGNOSIS — I214 Non-ST elevation (NSTEMI) myocardial infarction: Secondary | ICD-10-CM | POA: Diagnosis not present

## 2014-05-14 DIAGNOSIS — Z8249 Family history of ischemic heart disease and other diseases of the circulatory system: Secondary | ICD-10-CM | POA: Diagnosis not present

## 2014-05-14 DIAGNOSIS — J029 Acute pharyngitis, unspecified: Secondary | ICD-10-CM | POA: Diagnosis not present

## 2014-05-14 DIAGNOSIS — M6281 Muscle weakness (generalized): Secondary | ICD-10-CM | POA: Diagnosis not present

## 2014-05-14 DIAGNOSIS — I252 Old myocardial infarction: Secondary | ICD-10-CM | POA: Diagnosis not present

## 2014-05-14 DIAGNOSIS — R200 Anesthesia of skin: Secondary | ICD-10-CM | POA: Diagnosis not present

## 2014-05-14 DIAGNOSIS — R319 Hematuria, unspecified: Secondary | ICD-10-CM | POA: Diagnosis not present

## 2014-05-14 DIAGNOSIS — J441 Chronic obstructive pulmonary disease with (acute) exacerbation: Secondary | ICD-10-CM | POA: Diagnosis not present

## 2014-05-14 DIAGNOSIS — D6959 Other secondary thrombocytopenia: Secondary | ICD-10-CM | POA: Diagnosis not present

## 2014-05-14 DIAGNOSIS — J449 Chronic obstructive pulmonary disease, unspecified: Secondary | ICD-10-CM | POA: Diagnosis present

## 2014-05-14 DIAGNOSIS — Z8673 Personal history of transient ischemic attack (TIA), and cerebral infarction without residual deficits: Secondary | ICD-10-CM | POA: Diagnosis not present

## 2014-05-14 DIAGNOSIS — I469 Cardiac arrest, cause unspecified: Secondary | ICD-10-CM | POA: Diagnosis not present

## 2014-05-14 DIAGNOSIS — I739 Peripheral vascular disease, unspecified: Secondary | ICD-10-CM | POA: Diagnosis present

## 2014-05-14 DIAGNOSIS — I426 Alcoholic cardiomyopathy: Secondary | ICD-10-CM | POA: Diagnosis not present

## 2014-05-14 DIAGNOSIS — Z885 Allergy status to narcotic agent status: Secondary | ICD-10-CM | POA: Diagnosis not present

## 2014-05-14 LAB — BASIC METABOLIC PANEL
ANION GAP: 3 — AB (ref 5–15)
BUN: 22 mg/dL (ref 6–23)
CHLORIDE: 106 mmol/L (ref 96–112)
CO2: 29 mmol/L (ref 19–32)
Calcium: 9 mg/dL (ref 8.4–10.5)
Creatinine, Ser: 1.28 mg/dL (ref 0.50–1.35)
GFR calc Af Amer: 61 mL/min — ABNORMAL LOW (ref >90)
GFR calc non Af Amer: 53 mL/min — ABNORMAL LOW (ref >90)
Glucose, Bld: 102 mg/dL — ABNORMAL HIGH (ref 70–99)
POTASSIUM: 4.6 mmol/L (ref 3.5–5.1)
Sodium: 138 mmol/L (ref 135–145)

## 2014-05-14 LAB — CBC
HCT: 33.8 % — ABNORMAL LOW (ref 39.0–52.0)
Hemoglobin: 11.7 g/dL — ABNORMAL LOW (ref 13.0–17.0)
MCH: 32.8 pg (ref 26.0–34.0)
MCHC: 34.6 g/dL (ref 30.0–36.0)
MCV: 94.7 fL (ref 78.0–100.0)
Platelets: 519 10*3/uL — ABNORMAL HIGH (ref 150–400)
RBC: 3.57 MIL/uL — ABNORMAL LOW (ref 4.22–5.81)
RDW: 13.1 % (ref 11.5–15.5)
WBC: 6.2 10*3/uL (ref 4.0–10.5)

## 2014-05-14 MED ORDER — POLYETHYLENE GLYCOL 3350 17 G PO PACK: 17.0000 g | PACK | Freq: Two times a day (BID) | ORAL | Status: DC

## 2014-05-14 MED ORDER — APIXABAN 5 MG PO TABS: 5.0000 mg | ORAL_TABLET | Freq: Two times a day (BID) | ORAL | Status: DC

## 2014-05-14 MED ORDER — CLOPIDOGREL BISULFATE 75 MG PO TABS: 75.0000 mg | ORAL_TABLET | Freq: Every day | ORAL | Status: DC

## 2014-05-14 MED ORDER — RANOLAZINE ER 500 MG PO TB12: 500.0000 mg | ORAL_TABLET | Freq: Two times a day (BID) | ORAL | Status: DC

## 2014-05-14 MED ORDER — ENSURE COMPLETE PO LIQD: 237.0000 mL | Freq: Two times a day (BID) | ORAL | Status: DC

## 2014-05-14 MED ORDER — AMIODARONE HCL 200 MG PO TABS
300.0000 mg | ORAL_TABLET | Freq: Two times a day (BID) | ORAL | Status: DC
Start: 2014-05-14 — End: 2014-06-05

## 2014-05-14 MED ORDER — TRAMADOL HCL 50 MG PO TABS: 50.0000 mg | ORAL_TABLET | Freq: Two times a day (BID) | ORAL | Status: DC | PRN

## 2014-05-14 MED ORDER — FUROSEMIDE 20 MG PO TABS: 20.0000 mg | ORAL_TABLET | Freq: Every day | ORAL | Status: DC

## 2014-05-14 MED ORDER — BISACODYL 10 MG RE SUPP: 10.0000 mg | Freq: Every day | RECTAL | Status: DC | PRN

## 2014-05-14 MED ORDER — TRAZODONE HCL 50 MG PO TABS: 50.0000 mg | ORAL_TABLET | Freq: Every day | ORAL | Status: DC

## 2014-05-14 MED ORDER — ATORVASTATIN CALCIUM 40 MG PO TABS: 40.0000 mg | ORAL_TABLET | Freq: Every day | ORAL | Status: DC

## 2014-05-14 MED ORDER — CARVEDILOL 3.125 MG PO TABS: 3.1250 mg | ORAL_TABLET | Freq: Two times a day (BID) | ORAL | Status: DC

## 2014-05-14 NOTE — Assessment & Plan Note (Signed)
Developed during cath;was shocked 13 times, amiodarone and lidocaine load and drips; d/c on amiodarone

## 2014-05-14 NOTE — Assessment & Plan Note (Deleted)
During cath;required CPR, pressors, IAPB for survival

## 2014-05-14 NOTE — Discharge Instructions (Signed)
Daily weights  Physical therapy

## 2014-05-14 NOTE — Assessment & Plan Note (Signed)
During cath;required CPR, pressors , IABP for survival

## 2014-05-14 NOTE — Assessment & Plan Note (Signed)
2/2 to MI and literal heart failure in every parameter;intubated he is smoker with COPD without exacerbation but was txed with nebsin effort to be able to wean him; was made DNR prior to extubation but now that he is in SNF he desires FULL code; future infarct is inevitable

## 2014-05-14 NOTE — Assessment & Plan Note (Signed)
Not commented on but noted by me tonight;pt changed answers to questions, lost his way while speaking, couldn't find words, had some practiced answers; may be too early to say for sure but very possible some brain injury acquired during cardiac arrest

## 2014-05-14 NOTE — Assessment & Plan Note (Signed)
LDL 89, HDL 38.5 on lipitor 40 mg;Continue current dose

## 2014-05-14 NOTE — Clinical Social Work Note (Signed)
Patient to be d/c'ed today to [facility].  Patient and family agreeable to plans will transport via ems RN to call report.  Evette Cristal, MSW, [place]

## 2014-05-14 NOTE — Progress Notes (Signed)
MRN: [id number] Name: [name]  Sex: male Age: 74 y.o. DOB: 17-Nov-1940  [place] #: [facility] Facility/Room: 102 Level Of Care: SNF Provider: Inocencio Homes D Emergency Contacts: Extended Emergency Contact Information Primary Emergency Contact: Kirschenmann,Geraldine Address: [address]          [place], [place] [address] [place] Phone: [phone] Relation: Spouse  Code Status: FULL  Allergies: Codeine sulfate  Chief Complaint  Patient presents with  . New Admit To SNF    HPI: Patient is 74 y.o. male who Had an MI with every possible complication including cardiac arrest during emergent cath, partial PTCA because not a surgical candidate, intubated with dicey wean, admitted to SNF for OT/PT.  Past Medical History  Diagnosis Date  . CAD (coronary artery disease)     CABG x 2 with LIMA to Circumflex and vein graft to PDA  07/13/1991  . History of transient ischemic attack (TIA)   . Hyperlipidemia   . Peripheral neuropathy   . PVD (peripheral vascular disease) with claudication   . Carotid bruit     bilateral  . Ventral hernia   . Personal history of prostate cancer   . Diverticulosis   . ALLERGIC RHINITIS   . Osteoarthritis   . COPD (chronic obstructive pulmonary disease)   . Tobacco use disorder, continuous   . STEMI (ST elevation myocardial infarction) 04/29/2014    PTCA Lmain and CFX, in setting of VF/VT arrest and CGS    Past Surgical History  Procedure Laterality Date  . Coronary artery bypass graft  1992  . Lumbar laminectomy  [id number]     X2  . Prostatectomy  05/2008    Dr. Alinda Money and XRT  . Appendectomy    . Tonsillectomy    . Left heart catheterization with coronary angiogram N/A 04/30/2014    Procedure: LEFT HEART CATHETERIZATION WITH CORONARY ANGIOGRAM;  Surgeon: Peter M Martinique, MD; Lmain 90% (s/p PTCA), LAD 80%, CFX 100% (s/p PTCA), RCA OK, LIMA-LAD atretic, SVG-OM 100% (chronic), EF 45%, pt req defib x 13 for VF/VT  arrest, CHB w/ tem pacer, IABP and intubation      Medication List       This list is accurate as of: 05/14/14 10:16 PM.  Always use your most recent med list.               acetaminophen 325 MG tablet  Commonly known as:  TYLENOL  Take 975 mg by mouth 3 (three) times daily as needed (pain).     amiodarone 200 MG tablet  Commonly known as:  PACERONE  Take 1.5 tablets (300 mg total) by mouth 2 (two) times daily.     apixaban 5 MG Tabs tablet  Commonly known as:  ELIQUIS  Take 1 tablet (5 mg total) by mouth 2 (two) times daily.     atorvastatin 40 MG tablet  Commonly known as:  LIPITOR  Place 1 tablet (40 mg total) into feeding tube daily at 6 PM.     bisacodyl 10 MG suppository  Commonly known as:  DULCOLAX  Place 1 suppository (10 mg total) rectally daily as needed for moderate constipation.     carvedilol 3.125 MG tablet  Commonly known as:  COREG  Take 1 tablet (3.125 mg total) by mouth 2 (two) times daily with a meal.     clobetasol cream 0.05 %  Commonly known as:  TEMOVATE  Apply 1 application topically 3 (three) times daily as needed (rash).  Do not use on face or genitals     clobetasol ointment 0.05 %  Commonly known as:  TEMOVATE  Apply 1 application topically 2 (two) times daily.     clopidogrel 75 MG tablet  Commonly known as:  PLAVIX  Take 1 tablet (75 mg total) by mouth daily.     feeding supplement (ENSURE COMPLETE) Liqd  Take 237 mLs by mouth 2 (two) times daily between meals.     fluticasone 50 MCG/ACT nasal spray  Commonly known as:  FLONASE  Place 1 spray into both nostrils 2 (two) times daily.     furosemide 20 MG tablet  Commonly known as:  LASIX  Take 1 tablet (20 mg total) by mouth daily.     loratadine 10 MG tablet  Commonly known as:  CLARITIN  Take 10 mg by mouth daily as needed for allergies.     nitroGLYCERIN 0.4 MG SL tablet  Commonly known as:  NITROSTAT  Place 0.4 mg under the tongue every 5 (five) minutes as needed for  chest pain.     omeprazole 20 MG capsule  Commonly known as:  PRILOSEC  Take 20 mg by mouth daily.     polyethylene glycol packet  Commonly known as:  MIRALAX / GLYCOLAX  Place 17 g into feeding tube 2 (two) times daily.     PRESCRIPTION MEDICATION  Apply 1 application topically 2 (two) times daily. Sodium fluoride 1% gel - apply thin ribbon to teeth with toothbrush     ranolazine 500 MG 12 hr tablet  Commonly known as:  RANEXA  Take 1 tablet (500 mg total) by mouth 2 (two) times daily.     traMADol 50 MG tablet  Commonly known as:  ULTRAM  Take 1-2 tablets (50-100 mg total) by mouth 2 (two) times daily as needed (pain).     traZODone 50 MG tablet  Commonly known as:  DESYREL  Take 1 tablet (50 mg total) by mouth at bedtime.     triamcinolone ointment 0.5 %  Commonly known as:  KENALOG  Apply 1 application topically 3 (three) times daily.        No orders of the defined types were placed in this encounter.    Immunization History  Administered Date(s) Administered  . H1N1 03/16/2008  . Influenza Split 02/20/2012  . Influenza Whole 01/07/2008, 02/05/2008, 02/01/2009, 01/25/2010  . Influenza,inj,Quad PF,36+ Mos 12/24/2012, 01/11/2014  . Pneumococcal Conjugate-13 04/14/2014  . Pneumococcal Polysaccharide-23 04/23/2005  . Td 02/20/2010  . Tdap 07/08/2012    History  Substance Use Topics  . Smoking status: Current Every Day Smoker -- 40 years    Types: Cigarettes  . Smokeless tobacco: Not on file  . Alcohol Use: No    Family history is noncontributory    Review of Systems  DATA OBTAINED: from patient GENERAL:  no fevers, fatigue, appetite changes SKIN: No itching, rash or wounds EYES: No eye pain, redness, discharge EARS: No earache, tinnitus, change in hearing NOSE: No congestion, drainage or bleeding  MOUTH/THROAT: No mouth or tooth pain, No sore throat RESPIRATORY: No cough, wheezing, SOB CARDIAC: No chest pain, palpitations, lower extremity edema   GI: No abdominal pain, No N/V/D or constipation, No heartburn or reflux  GU: No dysuria, frequency or urgency, or incontinence  MUSCULOSKELETAL: No unrelieved bone/joint pain NEUROLOGIC: No headache, dizziness or focal weakness PSYCHIATRIC: No overt anxiety or sadness   Filed Vitals:   05/14/14 2119  BP: 95/65  Pulse: 77  Temp: 98.6  F (37 C)  Resp: 20    Physical Exam  GENERAL APPEARANCE: Alert, mod conversant,  No acute distress.  SKIN: No diaphoresis rash HEAD: Normocephalic, atraumatic  EYES: Conjunctiva/lids clear. Pupils round, reactive. EOMs intact.  EARS: External exam WNL, canals clear. Hearing grossly normal.  NOSE: No deformity or discharge.  MOUTH/THROAT: Lips w/o lesions  RESPIRATORY: Breathing is even, unlabored. Lung sounds are clear   CARDIOVASCULAR: Heart RRR no murmurs, rubs or gallops. No peripheral edema.   GASTROINTESTINAL: Abdomen is soft, non-tender, not distended w/ normal bowel sounds. GENITOURINARY: Bladder non tender, not distended  MUSCULOSKELETAL: No abnormal joints or musculature NEUROLOGIC:  Cranial nerves 2-12 grossly intact. Moves all extremities;pt's responses are inconsistent, changes answers, can't think of others, dropped some sentences mixed in with some reasonable conversation  PSYCHIATRIC: appears lost, somewhat vague  Patient Active Problem List   Diagnosis Date Noted  . Encephalopathy 05/14/2014  . Acute respiratory acidosis   . S/P CABG x 2 05/04/2014  . Acute respiratory failure   . STEMI (ST elevation myocardial infarction) 04/30/2014  . Cardiogenic shock 04/30/2014  . Cardiac arrest 04/30/2014  . VT (ventricular tachycardia) 04/30/2014  . CHB (complete heart block) 04/30/2014  . CAD (coronary artery disease), native coronary artery 04/30/2014  . Wart viral 04/18/2014  . Laceration of index finger 12/04/2012  . Hand eczema 05/26/2012  . Bruising 02/20/2012  . Actinic keratoses 11/14/2011  . PAD (peripheral artery disease)  08/28/2011  . Constipation 07/18/2011  . Sinusitis acute 04/18/2011  . Ventral hernia 01/03/2011  . SWEATING 06/26/2010  . History of transient ischemic attack (TIA)   . CERUMEN IMPACTION 02/20/2010  . DIVERTICULOSIS-COLON 11/01/2009  . RECTAL BLEEDING 11/01/2009  . ABDOMINAL BLOATING 11/01/2009  . ABNORMAL BOWEL SOUNDS 11/01/2009  . ABDOMINAL PAIN-RLQ 11/01/2009  . ABDOMINAL PAIN-LLQ 11/01/2009  . ABDOMINAL PAIN, GENERALIZED 11/01/2009  . ABDOMINAL PAIN, LOWER 11/01/2009  . Rash and nonspecific skin eruption 07/11/2009  . TRANSIENT ISCHEMIC ATTACK, HX OF 03/22/2009  . PVD WITH CLAUDICATION 03/14/2009  . BRONCHITIS, ACUTE 11/26/2008  . Personal history of malignant neoplasm of prostate 08/02/2008  . TOBACCO USE DISORDER/SMOKER-SMOKING CESSATION DISCUSSED 12/08/2007  . ALLERGIC RHINITIS 08/06/2007  . ELEVATED PROSTATE SPECIFIC ANTIGEN 08/06/2007  . Hyperlipidemia 04/22/2007  . Hereditary and idiopathic peripheral neuropathy 04/22/2007  . CORONARY ARTERY DISEASE 04/22/2007  . UPPER RESPIRATORY INFECTION (URI) 04/22/2007  . COPD 04/22/2007  . OSTEOARTHRITIS 04/22/2007  . LOW BACK PAIN 04/22/2007    CBC    Component Value Date/Time   WBC 6.2 05/14/2014 0430   WBC 7.6 12/01/2008 1247   RBC 3.57* 05/14/2014 0430   RBC 3.84* 12/01/2008 1247   HGB 11.7* 05/14/2014 0430   HGB 13.2 12/01/2008 1247   HCT 33.8* 05/14/2014 0430   HCT 38.3* 12/01/2008 1247   PLT 519* 05/14/2014 0430   PLT 205 12/01/2008 1247   MCV 94.7 05/14/2014 0430   MCV 99.9* 12/01/2008 1247   LYMPHSABS 1.7 04/29/2014 2130   LYMPHSABS 0.7* 12/01/2008 1247   MONOABS 0.9 04/29/2014 2130   MONOABS 0.8 12/01/2008 1247   EOSABS 0.6 04/29/2014 2130   EOSABS 0.5 12/01/2008 1247   BASOSABS 0.0 04/29/2014 2130   BASOSABS 0.0 12/01/2008 1247    CMP     Component Value Date/Time   NA 138 05/14/2014 0430   K 4.6 05/14/2014 0430   CL 106 05/14/2014 0430   CO2 29 05/14/2014 0430   GLUCOSE 102* 05/14/2014 0430    GLUCOSE 114* 04/22/2006 0854  BUN 22 05/14/2014 0430   CREATININE 1.28 05/14/2014 0430   CALCIUM 9.0 05/14/2014 0430   PROT 6.9 11/19/2012 0958   ALBUMIN 4.0 11/19/2012 0958   AST 17 11/19/2012 0958   ALT 16 11/19/2012 0958   ALKPHOS 68 11/19/2012 0958   BILITOT 1.1 05/03/2014 1000   GFRNONAA 53* 05/14/2014 0430   GFRAA 61* 05/14/2014 0430    Assessment and Plan  STEMI (ST elevation myocardial infarction) Pt's presentation was CP; was taken to cath lab where he developed vfib,shock and coded where he required every intervention possible to survive; pt would not survive redo CABG, PTCA was attempted with some success of Lcircumflex (50% residual blockage after)and distal L main (80% residual  Blockage after procedure). Pt not considered candidate for further PCI; d/c on bblocker, , eliquis, ranexa   VT (ventricular tachycardia) Developed during cath;was shocked 13 times, amiodarone and lidocaine load and drips; d/c on amiodarone   Cardiac arrest During cath,required CPR, pressors , IABP for survival   Cardiogenic shock During cath;required CPR, pressors , IABP for survival   CHB (complete heart block) Developed during cath; temporary pacer was placed via R femoral artery   Acute respiratory failure 2/2 to MI and literal heart failure in every parameter;intubated he is smoker with COPD without exacerbation but was txed with nebsin effort to be able to wean him; was made DNR prior to extubation but now that he is in SNF he desires FULL code; future infarct is inevitable   Hyperlipidemia LDL 89, HDL 38.5 on lipitor 40 mg;Continue current dose   S/P CABG x 2 Prior CABG, not a candidate for a repeat   Encephalopathy Not commented on but noted by me tonight;pt changed answers to questions, lost his way while speaking, couldn't find words, had some practiced answers; may be too early to say for sure but very possible some brain injury acquired during cardiac  arrest     Hennie Duos, MD

## 2014-05-14 NOTE — Assessment & Plan Note (Signed)
During cath,required CPR, pressors , IABP for survival

## 2014-05-14 NOTE — Progress Notes (Signed)
PT Cancellation Note  Patient Details Name: [name] MRN: [id number] DOB: 11-10-1940   Cancelled Treatment:    Reason Eval/Treat Not Completed: Other (comment) (discharging to SNF this afternoon); also attempted to see pt this morning, and was just s/p cardiac rehab and requested PT to return in the pm.   [facility] 05/14/2014, 2:26 PM

## 2014-05-14 NOTE — Progress Notes (Addendum)
CARDIAC REHAB PHASE I   PRE:  Rate/Rhythm: 83 aflutter    BP: sitting 97/50    SaO2: 98 RA  MODE:  Ambulation: 250 ft   POST:  Rate/Rhythm: 111 afib    BP: sitting 112/45     SaO2: 100 RA  Pt stronger today. Used gait belt and RW but I did not need to support him. Denied CP however he did st he had CP this am. ? If he is intermittently confused. Discussed MI and NTG with pt. Encouraged to continue walking and rehab. Discussed smoking cessation. Pt n/a for CRPII right now. [id number]   [name] [place] CES, ACSM 05/14/2014 10:27 AM

## 2014-05-14 NOTE — Progress Notes (Signed)
Pt d/c to SNF [facility].  Alert and oriented x3.  Some intermittent confusion.  No c/o pain.  Family notified of discharge and belongings transferred w/ pt.  Pt was transferred with education on diet, activity, meds, and follow-up care and instructions.

## 2014-05-14 NOTE — Assessment & Plan Note (Signed)
Prior CABG, not a candidate for a repeat

## 2014-05-14 NOTE — Progress Notes (Signed)
Patient Name: [name] Date of Encounter: 05/14/2014  Principal Problem:   STEMI (ST elevation myocardial infarction) Active Problems:   Cardiogenic shock   Cardiac arrest   VT (ventricular tachycardia)   CHB (complete heart block)   CAD (coronary artery disease), native coronary artery   Acute respiratory failure   S/P CABG x 2   Acute respiratory acidosis   Primary Cardiologist: Dr. Angelena Form  Patient Profile: 74 yo male w/ hx CABG,TIA, PVD, HLD, COPD, tobacco abuse, admitted 01/21 w/ STEMI, CHB w/ temp pacer, VT arrest. Req IABP 2nd CGS, had PTCA Lmain & CFX. Also w/ PAF. He is now DNR.  SUBJECTIVE: Tired, no other specific complaints  OBJECTIVE Filed Vitals:   05/13/14 0344 05/13/14 1332 05/13/14 2053 05/14/14 0451  BP: 110/65 104/51 105/45 118/53  Pulse: 64 82 78 91  Temp: 97.6 F (36.4 C) 98.1 F (36.7 C) 98.6 F (37 C) 97.4 F (36.3 C)  TempSrc: Oral Oral Oral Oral  Resp: 18 18 18 18   Height:      Weight: 153 lb 3.5 oz (69.5 kg)     SpO2: 98% 100% 98% 96%    Intake/Output Summary (Last 24 hours) at 05/14/14 0912 Last data filed at 05/13/14 1746  Gross per 24 hour  Intake    360 ml  Output      0 ml  Net    360 ml   Filed Weights   05/08/14 0104 05/10/14 0454 05/13/14 0344  Weight: 165 lb 9.1 oz (75.1 kg) 158 lb 11.7 oz (72 kg) 153 lb 3.5 oz (69.5 kg)    PHYSICAL EXAM General: Well developed, well nourished, male in no acute distress. Head: Normocephalic, atraumatic.  Neck: Supple without bruits, JVD not elevated. Lungs:  Resp regular and unlabored, slightly decreased BS bases. Heart: Irreg irreg, S1, S2, no S3, S4, or murmur; no rub. Abdomen: Soft, non-tender, non-distended, BS + x 4.  Extremities: No clubbing, cyanosis, no edema.  Neuro: Alert and oriented X 3. Moves all extremities spontaneously. Psych: Normal affect.  LABS: CBC:  Recent Labs  05/13/14 0500 05/14/14 0430  WBC 8.0 6.2  HGB 11.5* 11.7*  HCT 32.8* 33.8*    MCV 94.3 94.7  PLT 504* 035*   Basic Metabolic Panel:  Recent Labs  05/13/14 0500 05/14/14 0430  NA 137 138  K 4.3 4.6  CL 106 106  CO2 22 29  GLUCOSE 106* 102*  BUN 23 22  CREATININE 1.25 1.28  CALCIUM 8.6 9.0    TELE:  Atrial flutter, variable VR   Current Medications:  . amiodarone  300 mg Oral BID  . antiseptic oral rinse  7 mL Mouth Rinse q12n4p  . apixaban  5 mg Oral BID  . atorvastatin  40 mg Per Tube q1800  . carvedilol  3.125 mg Oral BID WC  . chlorhexidine  15 mL Mouth Rinse BID  . clopidogrel  75 mg Oral Daily  . feeding supplement (ENSURE COMPLETE)  237 mL Oral BID BM  . furosemide  20 mg Oral Daily  . polyethylene glycol  17 g Per Tube BID  . potassium chloride  40 mEq Oral BID  . ranolazine  500 mg Oral BID  . traZODone  50 mg Oral QHS   . sodium chloride Stopped (05/11/14 1228)    ASSESSMENT AND PLAN: CAD/STEMI- -- The patient was found have severe 3V CAD. Dr. Martinique was able to open the left main slightly and was able to  open the circumflex artery slightly. He was unable to wire the left anterior descending artery because of tortuosity and an acute angle.  -- We will continue with current medical therapy.  -- He has been seen by Dr. Servando Snare and was thought to be a poor candidate for CABG at this time bc of significance comorbities. STS risk mortality >25% and risk or mortality morbidity 75%. Per a discussion with Dr. Servando Snare, if he continues to improve, a referall can be made an an outpatient.  -- Added low dose carvedilol at 3.125 bid 02/03 and tolerating this well. Ranolazine added 02/02 -- No chest pain w/ ambulation, PT  Paroxysmal atrial fib/flutter- he converted back into atral flutter 02/01. Tele reveals atrial flutter with variable block. HR 70-120s. Rates generally well controlled.  -- On amiodarone 300mg  BID -- CHADS 2 VASC of 6 ( age of 27, hx of CVA, HTN, DM).  -- Started on Eliquis and DC'd ASA since he is on Plavix    Acute on chronic systolic CHF w/ pulmonary edema  -- Peak weight 185, was on IV lasix 40mg  BID x 5 days. No more SOB. I/O net 2.7L. Changed to 20 mg po daily Lasix 02/02. BMET OK. -- EF 45-50%  Cardiogenic shock- BP has been stable. He is off pressors. The IABP has been pulled. Extubated.  VT (ventricular tachycardia): Converted, on 300mg  BID PO amio. No further episodes  CHB (complete heart block): He responded to atropine. Now resolved.  Hypokalemia:resolved. BMET shows K+ trending up, will d/c Kdur 40 meq, BMET next week   Deconditioning: PT and cardiac rehab seeing, making some progress, for SNF placement when otherwise stable.   Plan: d/c to SNF when medically stable, possibly today. Pt has accepted a bed at [facility].  Principal Problem:   STEMI (ST elevation myocardial infarction) Active Problems:   Cardiogenic shock   Cardiac arrest   VT (ventricular tachycardia)   CHB (complete heart block)   CAD (coronary artery disease), native coronary artery   Acute respiratory failure   S/P CABG x 2   Acute respiratory acidosis   Signed, Rosaria Ferries , PA-C 9:12 AM 05/14/2014   Patient seen and examined. Agree with assessment and plan. Continues to improve. Ventricular rate controlled in the 70's. Will continue with amiodarone with potential for future cardioversion if he does not pharmacologically cardiovert.  On eliquis and Plavix. OK to dc to SNF. F/u with Dr. Julianne Handler and also with Dr. Wyatt Haste, MD, [facility] 05/14/2014 10:26 AM

## 2014-05-14 NOTE — Assessment & Plan Note (Signed)
Pt's presentation was CP; was taken to cath lab where he developed vfib,shock and coded where he required every intervention possible to survive; pt would not survive redo CABG, PTCA was attempted with some success of Lcircumflex (50% residual blockage after)and distal L main (80% residual  Blockage after procedure). Pt not considered candidate for further PCI; d/c on bblocker, , eliquis, ranexa

## 2014-05-14 NOTE — Assessment & Plan Note (Signed)
Developed during cath; temporary pacer was placed via R femoral artery

## 2014-05-14 NOTE — Progress Notes (Signed)
EKG performed this AM revealed a flutter, inferior-posterior infarct, possibly acute, ST/T wave abnormality, acute MI/STEMI.  Pt asymptomatic and resting comfortably.  BP 118/53, 96% RA, HR 70.  MD Philbert Riser paged and made aware.  Rapid response also contacted and assessed patient.  No new orders at this time.  Will continue to monitor closely.  Claudette Stapler, RN

## 2014-05-14 NOTE — Discharge Summary (Signed)
CARDIOLOGY DISCHARGE SUMMARY   Patient ID: [name] MRN: [id number] DOB/AGE: [birth year] 74 y.o.  Admit date: 04/29/2014 Discharge date: 05/14/2014  PCP: Walker Kehr, MD Primary Cardiologist: Dr. Angelena Form  Primary Discharge Diagnosis:  STEMI Secondary Discharge Diagnosis:    Cardiogenic shock   Cardiac arrest   VT (ventricular tachycardia)   CHB (complete heart block)   CAD (coronary artery disease), native coronary artery   Acute respiratory failure   S/P CABG x 2   Acute respiratory acidosis   Deconditioning   Consults: Dr. West Carbo with [facility], Dr. Servando Snare with [facility], Social Work  Procedures: Left Heart Cath, Selective Coronary Angiography, SVG and LIMA angiography, LV angiography, PTCA of the left main coronary artery. Insertion of temporary pacemaker. Insertion of IABP. Multiple episodes of ventricular fibrillation requiring defibrillation x 13, 2-D echocardiogram, portable abdominal x-ray, serial chest x-rays   Hospital Course: [name] is a 74 y.o. male with a history of CAD, PhD, hyperlipidemia who developed chest pain associated with nausea and vomiting. 3 hours after symptoms started, he called 911. Upon arrival to the ER, his chest pain was still a 6/10. He had been given aspirin, nitroglycerin and 4 mg of morphine by EMS. Plavix load was attempted, but he vomited several times. He was started on heparin drip and taken emergently to the Cath Lab.  Cardiac catheterization results are below. He had frequent ventricular tachycardia and ventricular fibrillation episodes requiring defibrillation, a total of 13 times. He was started on amiodarone with a bolus and then a drip. He was also started on lidocaine with bolus and then a drip. He was intubated for airway protection. He was in complete heart block and had a temporary pacer wire inserted. He was hypotensive and didn't IABP was inserted.  Dr. Martinique reviewed the films and felt  that ideally, he needed a redo bypass surgery. However, due to his critical illness, he had PTCA attempted to the left main and circumflex, both were partially successful. After the PTCA, his condition gradually improved. He was transferred to CCU.  CCM consult was called to manage the vent and his respiratory status. He had acute respiratory failure and a history of tobacco use. He had COPD but no evidence of exacerbation. He was continued on the vent until he was more hemodynamically stable. He was started on dual nebs and albuterol. His ABG and chest x-ray were followed.  He was in cardiogenic shock and required Levophed for pressure support. He was also on Versed and fentanyl for sedation. He was on IV heparin and antibiotics were started for possible upper respiratory infection. One of his stools was heme positive, but his hemoglobin and hematocrit were followed carefully. He was anemic at times with a hemoglobin as low as 9.7, however this gradually improved, discharge labs are below.  The IABP was removed on 05/03/2014.   He was seen by Dr. Servando Snare but was not currently surgical candidates because of his significant comorbidities. STS risk mortality >25% and risk of mortality/morbidity75%.   He was off pressors by 05/05/2014.  A family meeting was had with Dr. Titus Mould on 05/07/2014. The decision was made to wean the ventilator if able and the family decided that he should be extubated. He was made a DO NOT RESUSCITATE and if extubation failed, he would not be reintubated. He was extubated on 05/07/2014 and tolerated the procedure well.  He was gradually improving and began to be followed by cardiac rehabilitation as well as  physical therapy. He was very weak and physical therapy recommended skilled nursing facility placement with the plan to transition home. He continued to be followed by cardiac rehabilitation and by physical therapy during his hospital stay and slowly improved, but will  still need SNF placement short-term at discharge.  On 02/01, he went into atrial flutter. Initially this was at a rapid rate. He was on a beta blocker and this was of-titrated for better heart rate control with success. He had been on amiodarone ever since admission for the VT/VF. This was increased from 200 mg twice a day to 300 mg twice a day.   He had been on aspirin and Plavix. However, because the atrial flutter, he was started on Eliquis and the aspirin was discontinued.  He had been changed to by mouth Lasix on 02/02 as his respiratory status improved. He is to continue this at discharge.  On 02/05, he was seen by Dr. Claiborne Billings and all data were reviewed. His heart rate was controlled on the increased dose of amiodarone and beta blocker. He was still in atrial flutter but was having no further episodes of VT or VF. He was tolerating working with cardiac rehabilitation and physical therapy without chest pain. His shortness of breath had improved. His vital signs and labs were stable. No further inpatient workup was indicated and he is considered stable for discharge, to follow-up as an outpatient.  His code status was reviewed with him. He strongly feels that he needs to be around to take care of his wife. He does not want to be a DNR, and want everything done.   Labs:   ABG    Component Value Date/Time   PHART 7.481* 05/06/2014 0433   PCO2ART 39.6 05/06/2014 0433   PO2ART 84.4 05/06/2014 0433   HCO3 29.2* 05/06/2014 0433   TCO2 30.5 05/06/2014 0433   ACIDBASEDEF 0.5 05/04/2014 0930   O2SAT 97.1 05/06/2014 0433   Lab Results  Component Value Date   WBC 6.2 05/14/2014   HGB 11.7* 05/14/2014   HCT 33.8* 05/14/2014   MCV 94.7 05/14/2014   PLT 519* 05/14/2014     Recent Labs Lab 05/14/14 0430  NA 138  K 4.6  CL 106  CO2 29  BUN 22  CREATININE 1.28  CALCIUM 9.0  GLUCOSE 102*   Lab Results  Component Value Date   TROPONINI >80.00* 04/30/2014   Lipid Panel     Component  Value Date/Time   CHOL 146 11/19/2012 0958   TRIG 93.0 11/19/2012 0958   TRIG 91 04/22/2006 0854   HDL 38.50* 11/19/2012 0958   CHOLHDL 4 11/19/2012 0958   CHOLHDL 3.6 CALC 04/22/2006 0854   VLDL 18.6 11/19/2012 0958   LDLCALC 89 11/19/2012 0958      Radiology: Dg Chest Port 1 View 05/07/2014   CLINICAL DATA:  Acute respiratory failure  EXAM: PORTABLE CHEST - 1 VIEW  COMPARISON:  05/06/2014  FINDINGS: Cardiac shadow is stable. Postsurgical changes are again seen. An endotracheal tube, nasogastric catheter and right jugular central venous line are again seen and stable in appearance. Minimal persistent changes in the bases are seen stable from the prior exam. No new focal abnormality is seen. No bony abnormality is noted.  IMPRESSION: Tubes and lines as described above.  No significant interval change from the previous day.   Electronically Signed   By: Inez Catalina M.D.   On: 05/07/2014 07:24   Dg Chest Port 1 View 05/06/2014   CLINICAL DATA:  Respiratory acidosis.  EXAM: PORTABLE CHEST - 1 VIEW  COMPARISON:  05/05/2014.  FINDINGS: Endotracheal tube, NG tube, right IJ line stable position. Prior CABG. Heart size stable. Bibasilar pulmonary infiltrates have improved slightly. No prominent pleural effusion. No pneumothorax. No acute bony abnormality.  IMPRESSION: 1. Lines and tubes in stable position. 2. Interim slight improvement of bibasilar pulmonary alveolar infiltrates consistent with partial clearing of pulmonary edema and/or pneumonia. 3. Prior CABG.  Heart size stable.   Electronically Signed   By: Marcello Moores  Register   On: 05/06/2014 07:48   Dg Chest Port 1 View 05/05/2014   CLINICAL DATA:  Acute respiratory failure  EXAM: PORTABLE CHEST - 1 VIEW  COMPARISON:  05/04/2014  FINDINGS: Cardiomediastinal silhouette is stable. Status post median sternotomy. Stable endotracheal and NG tube position. Right IJ central line is unchanged in position. Persistent mild congestion/pulmonary edema. Probable  bilateral small pleural effusion with bilateral basilar atelectasis or infiltrate.  IMPRESSION: Stable support apparatus. Persistent mild congestion/pulmonary edema. Probable bilateral small pleural effusion with bilateral basilar atelectasis or infiltrate.   Electronically Signed   By: Lahoma Crocker M.D.   On: 05/05/2014 08:42   Dg Chest Port 1 View 05/04/2014   CLINICAL DATA:  74 year old male with acute respiratory failure on a ventilator.  EXAM: PORTABLE CHEST - 1 VIEW  COMPARISON:  Chest x-ray 05/03/2014.  FINDINGS: An endotracheal tube is in place with tip 5.9 cm above the carina. There is a right-sided internal jugular central venous catheter with tip terminating in the distal superior vena cava. Transcutaneous pacer/defibrillator pads project over the mid and left hemithorax. A nasogastric tube is seen extending into the stomach, however, the tip of the nasogastric tube extends below the lower margin of the image. Lung volumes appear normal. There is cephalization of the pulmonary vasculature, indistinctness of the interstitial markings, and patchy airspace disease throughout the lungs bilaterally suggestive of moderate pulmonary edema. Small to moderate bilateral pleural effusions. Mild cardiomegaly. The patient is rotated to the right on today's exam, resulting in distortion of the mediastinal contours and reduced diagnostic sensitivity and specificity for mediastinal pathology. Atherosclerosis in the thoracic aorta.  IMPRESSION: 1. Support apparatus, as above. 2. The appearance the chest suggests underlying congestive heart failure, as above. 3. Bibasilar opacities presumably reflect a combination of atelectasis with superimposed small to moderate bilateral pleural effusions, however, underlying airspace consolidation from infection or aspiration is not excluded.   Electronically Signed   By: Vinnie Langton M.D.   On: 05/04/2014 11:34   Dg Chest Port 1 View 05/03/2014   CLINICAL DATA:  ETT advanced.   Respiratory failure  EXAM: PORTABLE CHEST - 1 VIEW  COMPARISON:  05/02/2014  FINDINGS: The endotracheal tube tip is midway between the clavicular heads and the tracheal carina. The nasogastric tube extends well into the stomach and off the inferior edge of the image. Right jugular central line extends into the SVC about 1 cm below the azygos vein junction.  No pneumothorax is evident. No large effusions are evident. Mild basilar opacities are present, partially cleared on the left where there formerly was a more confluent opacity.  IMPRESSION: Lines and tubes as described.  Slight improvement in the left base.   Electronically Signed   By: Andreas Newport M.D.   On: 05/03/2014 16:16   Dg Chest Port 1 View 05/02/2014   CLINICAL DATA:  Acute respiratory failure  EXAM: PORTABLE CHEST - 1 VIEW  COMPARISON:  05/01/2014  FINDINGS: Cardiac shadow is stable. An  endotracheal tube, nasogastric catheter and right jugular line are again seen and stable in appearance. Postsurgical changes are again noted. Small pleural effusions are seen bilaterally. Stable retrocardiac atelectasis is noted.  IMPRESSION: Stable left retrocardiac atelectasis. Small pleural effusions bilaterally.  Tubes and lines as described.   Electronically Signed   By: Inez Catalina M.D.   On: 05/02/2014 07:14   Portable Chest Xray 05/01/2014   CLINICAL DATA:  Intubation  EXAM: PORTABLE CHEST - 1 VIEW  COMPARISON:  04/30/2014  FINDINGS: Endotracheal tube tip is just below the clavicular heads. The gastric suction tube at least reaches the diaphragm. Right IJ catheter is in stable position. The aortic balloon pump is at the level of the aortic arch, inflated at time of imaging.  Heart size remains normal. There is a persistent retrocardiac opacity, likely atelectasis and pleural fluid given the myocardial infarction presentation. No pulmonary edema. No pneumothorax.  IMPRESSION: 1. Unchanged positioning of tubes and lines. 2. Stable retrocardiac  atelectasis and small effusion.   Electronically Signed   By: Jorje Guild M.D.   On: 05/01/2014 06:41   Dg Chest Port 1 View 04/30/2014   CLINICAL DATA:  Central line placement.  EXAM: PORTABLE CHEST - 1 VIEW  COMPARISON:  None.  FINDINGS: Endotracheal tube, NG tube, right IJ line, mediastinal drainage catheter in stable position. Balloon pump noted with tip at upper portion of descending thoracic aorta. Prior CABG. Left lower lobe infiltrate. Small left pleural effusion. No acute bony abnormality.  IMPRESSION: 1. Interim placement of right IJ line. Tip in good anatomic position. Remaining lines and tubes in stable position. Balloon pump tip at the upper portion of the descending thoracic aorta. 2. Mild left lower lobe infiltrate and small left pleural effusion.   Electronically Signed   By: Marcello Moores  Register   On: 04/30/2014 16:47   Portable Chest Xray 04/30/2014   CLINICAL DATA:  Intubation  EXAM: PORTABLE CHEST - 1 VIEW  COMPARISON:  07/21/2009  FINDINGS: Endotracheal tube tip in good position at the clavicular heads. Gastric suction tube is short, as described on abdominal radiography. Interval advancement of aortic balloon pump since previous KUB, tip at the aortic arch. There is a temporary pacer wire from below with lead overlapping ventricles, presumably right ventricle.  No cardiomegaly. Aortic contours are negative. The patient has undergone CABG.  Mild perihilar atelectasis.  No edema or effusion.  No pneumothorax.  IMPRESSION: 1. Unremarkable appearance of endotracheal tube, aortic balloon pump, and temporary pacer wire. 2. Short nasogastric tube, reference KUB. 3. Perihilar atelectasis.  No pulmonary edema.   Electronically Signed   By: Jorje Guild M.D.   On: 04/30/2014 02:48   Dg Abd Portable 1v 04/30/2014   CLINICAL DATA:  Nasogastric tube repositioning  EXAM: PORTABLE ABDOMEN - 1 VIEW  COMPARISON:  KUB from the same day at 2:14 a.m.  FINDINGS: The nasogastric tube has been advanced into  the stomach, with tip in the distal body or antrum. The temporary pacer wire is in stable position, tip over the right ventricle.  IMPRESSION: 1. Nasogastric tube tip has advanced into the distal stomach. 2. Temporary pacer lead over the right ventricle.   Electronically Signed   By: Jorje Guild M.D.   On: 04/30/2014 04:27   Dg Abd Portable 1v 04/30/2014   CLINICAL DATA:  Encounter for nasogastric tube  EXAM: PORTABLE ABDOMEN - 1 VIEW  COMPARISON:  Abdominal CT 11/04/2009  FINDINGS: Nasogastric tube tip is at the proximal stomach, with  side port at the lower esophagus. Advancement by 8 cm would provide more secure positioning.  There is an arterial sheath in the left groin with aortic balloon pump at the level of L2. This is been advanced subsequent chest x-ray. Temporary pacer wire via right groin, tip not visualized.  These results were called by telephone at the time of interpretation on 04/30/2014 at 2:45 am to Harvey who verbally acknowledged these results.  IMPRESSION: Nasogastric tube tip at the gastric cardia. Advancement by 8 cm would provide more secure positioning.   Electronically Signed   By: Jorje Guild M.D.   On: 04/30/2014 02:45    Cardiac Cath: 04/30/2014 Coronary angiography: Coronary dominance: left Left mainstem: There is a 90% distal left main stenosis with heavy calcification.  Left anterior descending (LAD): There is an 80% ostial LAD stenosis followed by 2 aneurysmal segments. The remainder of the LAD is without significant disease. The first and second diagonals are without significant disease.  Left circumflex (LCx): The LCx gives off a moderately large OM and then is occluded. There is a 95% ostial LCx stenosis.  Right coronary artery (RCA): The RCA is a nondominant vessel with an anterior takeoff. It is patent.  SVG to OM is occluded chronically. LIMA to the LAD is atretic.  Left ventriculography: Left ventricular systolic function is abnormal, there is severe  inferior wall hypokinesis. LVEF is estimated at 45%, there is no significant mitral regurgitation  PCI Procedure Note: During diagnostic angiogram the patient developed progressive cardiogenic shock and complete heart block. A Venous sheath was placed in the right femoral vein and a temporary transvenous pacemaker was inserted into the RV apex under fluoro guidance. The patient developed multiple episodes of ventricular fibrillation requiring a total of 13 DC shocks. He was loaded with IV amiodarone and lidocaine. He received CPR and pressor support. He required several amps of IV epinephrine. An additional arterial access was obtained via the left femoral artery and an IABP was placed via this access for hemodynamic support. The patient was intubated for control of his airway and ventilator support. Once diagnostic angiograms were performed the case was discussed with Dr. Roxy Manns CT surgery who felt like at his age with redo bypass surgery and shock he would not survive. We elected to attempt PCI of the left main. The right femoral sheath was upsized to a 7 [place]. Weight-based heparin was given for anticoagulation. Once a therapeutic ACT was achieved, a 7 [place] XBLAD guide catheter was inserted. A Whisper coronary guidewire was used to cross the left main into the LCx/first OM. Multiple attempts were made unsuccessfully to place a wire down the LAD. We attempted to cross with a Prowater, Whisper, Choice PT, Luge, and Miracle 3 wires without success. The LAD was acutely angulated from the left main and heavily calcified. The wire tended to select a tiny side branch. There was also the aneurysmal segment in the LAD. We elected to perform balloon angioplasty into the LCx and the distal Left main. The LCx was initially dilated with a 2.0 mm balloon. It was then dilated with a 2.5 mm angiosculpt balloon. The distal left main was dilated with a 3.0 mm noncompliant balloon. Given our inability to access the LAD I  did not think that stenting was an option since stenting over the LAD would further compromise this vessel. Following PCI, there was 80% residual stenosis in the left main with 50% residual stenosis in the LCx ostium. The LAD was unchanged. There  appeared to be TIMI 3 flow. At this point the patient's rhythm stabilized with pacing. He continued to require IV pressors and IABP for hemodynamic support. The patient was transferred to the ICU for further monitoring and continued support as noted above. PCI Data: Vessel - Left main/Segment - distal/ ostial LCx Percent Stenosis (pre) 90%/95% TIMI-flow 2 PTCA only Percent Stenosis (post) 80%/50% TIMI-flow (post) 3 Unable to access the LAD with a wire. Final Conclusions:  1. Critical left main and ostial LAD/LCx disease in a left dominant circulation. Chronic total occlusion of the mid LCx 2. Occluded SVG to OM- chronic 3. Atretic LIMA to the LAD 4. Mild to moderate LV dysfunction. 5. Cardiogenic shock requiring IABP and IV pressor support 6. Recurrent Vfib with CPR and multiple defibrillations. 7. Acute respiratory failure requiring intubation and ventilator support. 8. Complete heart block requiring temporary pacemaker placement.  9. Partially successful PTCA only of the distal left main and ostial LCx.  Recommendations: Transfer to ICU for continued hemodynamic support. He is not a candidate for further PCI. Prognosis is very guarded  EKG: 05/06/2014 Complete heart block, ventricular rate 23, atrial rate 100  Echo: 04/30/2014 - Left ventricle: inferobasal and posterior lateral hypokinesis. The cavity size was normal. Wall thickness was normal. Systolic function was mildly reduced. The estimated ejection fraction was in the range of 45% to 50%. - Atrial septum: No defect or patent foramen ovale was identified. - Pulmonary arteries: PA peak pressure: 38 mm Hg (S).  FOLLOW UP PLANS AND APPOINTMENTS Allergies  Allergen  Reactions  . Codeine Sulfate Itching     Medication List    STOP taking these medications        amLODipine 5 MG tablet  Commonly known as:  NORVASC     isosorbide mononitrate 30 MG 24 hr tablet  Commonly known as:  IMDUR     LORazepam 0.5 MG tablet  Commonly known as:  ATIVAN     metoprolol 50 MG tablet  Commonly known as:  LOPRESSOR     simvastatin 20 MG tablet  Commonly known as:  ZOCOR      TAKE these medications        acetaminophen 325 MG tablet  Commonly known as:  TYLENOL  Take 975 mg by mouth 3 (three) times daily as needed (pain).     amiodarone 200 MG tablet  Commonly known as:  PACERONE  Take 1.5 tablets (300 mg total) by mouth 2 (two) times daily.     apixaban 5 MG Tabs tablet  Commonly known as:  ELIQUIS  Take 1 tablet (5 mg total) by mouth 2 (two) times daily.     atorvastatin 40 MG tablet  Commonly known as:  LIPITOR  Place 1 tablet (40 mg total) into feeding tube daily at 6 PM.     bisacodyl 10 MG suppository  Commonly known as:  DULCOLAX  Place 1 suppository (10 mg total) rectally daily as needed for moderate constipation.     carvedilol 3.125 MG tablet  Commonly known as:  COREG  Take 1 tablet (3.125 mg total) by mouth 2 (two) times daily with a meal.     clobetasol cream 0.05 %  Commonly known as:  TEMOVATE  Apply 1 application topically 3 (three) times daily as needed (rash). Do not use on face or genitals     clobetasol ointment 0.05 %  Commonly known as:  TEMOVATE  Apply 1 application topically 2 (two) times daily.     clopidogrel  75 MG tablet  Commonly known as:  PLAVIX  Take 1 tablet (75 mg total) by mouth daily.     feeding supplement (ENSURE COMPLETE) Liqd  Take 237 mLs by mouth 2 (two) times daily between meals.     fluticasone 50 MCG/ACT nasal spray  Commonly known as:  FLONASE  Place 1 spray into both nostrils 2 (two) times daily.     furosemide 20 MG tablet  Commonly known as:  LASIX  Take 1 tablet (20 mg total) by  mouth daily.     loratadine 10 MG tablet  Commonly known as:  CLARITIN  Take 10 mg by mouth daily as needed for allergies.     nitroGLYCERIN 0.4 MG SL tablet  Commonly known as:  NITROSTAT  Place 0.4 mg under the tongue every 5 (five) minutes as needed for chest pain.     omeprazole 20 MG capsule  Commonly known as:  PRILOSEC  Take 20 mg by mouth daily.     polyethylene glycol packet  Commonly known as:  MIRALAX / GLYCOLAX  Place 17 g into feeding tube 2 (two) times daily.     PRESCRIPTION MEDICATION  Apply 1 application topically 2 (two) times daily. Sodium fluoride 1% gel - apply thin ribbon to teeth with toothbrush     ranolazine 500 MG 12 hr tablet  Commonly known as:  RANEXA  Take 1 tablet (500 mg total) by mouth 2 (two) times daily.     traMADol 50 MG tablet  Commonly known as:  ULTRAM  Take 1-2 tablets (50-100 mg total) by mouth 2 (two) times daily as needed (pain).     traZODone 50 MG tablet  Commonly known as:  DESYREL  Take 1 tablet (50 mg total) by mouth at bedtime.     triamcinolone ointment 0.5 %  Commonly known as:  KENALOG  Apply 1 application topically 3 (three) times daily.        Discharge Instructions    (HEART FAILURE PATIENTS) Call MD:  Anytime you have any of the following symptoms: 1) 3 pound weight gain in 24 hours or 5 pounds in 1 week 2) shortness of breath, with or without a dry hacking cough 3) swelling in the hands, feet or stomach 4) if you have to sleep on extra pillows at night in order to breathe.    Complete by:  As directed      Diet - low sodium heart healthy    Complete by:  As directed      Increase activity slowly    Complete by:  As directed           Follow-up Information    Follow up with Lauree Chandler, MD.   Specialty:  Cardiology   Why:  The office will call.   Contact information:   [address] [place] [address] [phone]       BRING ALL MEDICATIONS WITH YOU TO FOLLOW UP  APPOINTMENTS  Time spent with patient to include physician time: 58 min Signed: Rosaria Ferries, PA-C 05/14/2014, 12:39 PM Co-Sign MD

## 2014-05-17 ENCOUNTER — Encounter: Payer: Self-pay | Admitting: Internal Medicine

## 2014-05-17 ENCOUNTER — Non-Acute Institutional Stay (SKILLED_NURSING_FACILITY): Payer: Medicare Other | Admitting: Internal Medicine

## 2014-05-17 DIAGNOSIS — J441 Chronic obstructive pulmonary disease with (acute) exacerbation: Secondary | ICD-10-CM | POA: Diagnosis not present

## 2014-05-17 DIAGNOSIS — R040 Epistaxis: Secondary | ICD-10-CM

## 2014-05-17 DIAGNOSIS — J029 Acute pharyngitis, unspecified: Secondary | ICD-10-CM | POA: Diagnosis not present

## 2014-05-17 DIAGNOSIS — I213 ST elevation (STEMI) myocardial infarction of unspecified site: Secondary | ICD-10-CM

## 2014-05-17 NOTE — Progress Notes (Signed)
Patient ID: [name], male   DOB: January 09, 1941, 74 y.o.   MRN: [id number]   this is an acute visit.  Level care skilled.  [facility]  Chief complaint acute visit secondary to sore throat-nosebleed.   HPI: Patient is 74 y.o. male who Had an MI with  complication including cardiac arrest during emergent cath, partial PTCA because not a surgical candidate, intubated with dicey wean, admitted to SNF for OT/PT Apparently earlier this morning patient had a nosebleed of short duration apparently this resolved with compression-talking to patient family apparently he did have nosebleeds at times during this hospitalization as well.  I did review his blood work most recent hemoglobin 11.7 on discharge from hospital late last week this was actually an improvement from 9.7 earlier in his stay---it appears his initial hemoglobin was 14.3 on hospital admission.  There's been no increased bruising from baseline--and no furtherbleeding today  He continues on Eliquis as well as Plavix for anticoagulation.  She also complaining of a sore throat the last couple days.  He does not complain of difficulty swallowing however.       Past Medical History  Diagnosis Date  . CAD (coronary artery disease)     CABG x 2 with LIMA to Circumflex and vein graft to PDA  07/13/1991  . History of transient ischemic attack (TIA)   . Hyperlipidemia   . Peripheral neuropathy   . PVD (peripheral vascular disease) with claudication   . Carotid bruit     bilateral  . Ventral hernia   . Personal history of prostate cancer   . Diverticulosis   . ALLERGIC RHINITIS   . Osteoarthritis   . COPD (chronic obstructive pulmonary disease)   . Tobacco use disorder, continuous   . STEMI (ST elevation myocardial infarction) 04/29/2014    PTCA Lmain and CFX, in setting of VF/VT arrest and CGS    Past Surgical History  Procedure Laterality Date  . Coronary artery bypass graft  1992  . Lumbar laminectomy   [id number]     X2  . Prostatectomy  05/2008    Dr. Alinda Money and XRT  . Appendectomy    . Tonsillectomy    . Left heart catheterization with coronary angiogram N/A 04/30/2014    Procedure: LEFT HEART CATHETERIZATION WITH CORONARY ANGIOGRAM;  Surgeon: Peter M Martinique, MD; Lmain 90% (s/p PTCA), LAD 80%, CFX 100% (s/p PTCA), RCA OK, LIMA-LAD atretic, SVG-OM 100% (chronic), EF 45%, pt req defib x 13 for VF/VT arrest, CHB w/ tem pacer, IABP and intubation      Medication List                      acetaminophen 325 MG tablet  Commonly known as:  TYLENOL  Take 975 mg by mouth 3 (three) times daily as needed (pain).     amiodarone 200 MG tablet  Commonly known as:  PACERONE  Take 1.5 tablets (300 mg total) by mouth 2 (two) times daily.     apixaban 5 MG Tabs tablet  Commonly known as:  ELIQUIS  Take 1 tablet (5 mg total) by mouth 2 (two) times daily.     atorvastatin 40 MG tablet  Commonly known as:  LIPITOR  Place 1 tablet (40 mg total) into feeding tube daily at 6 PM.     bisacodyl 10 MG suppository  Commonly known as:  DULCOLAX  Place 1 suppository (10 mg total) rectally daily as needed for moderate constipation.  carvedilol 3.125 MG tablet  Commonly known as:  COREG  Take 1 tablet (3.125 mg total) by mouth 2 (two) times daily with a meal.     clobetasol cream 0.05 %  Commonly known as:  TEMOVATE  Apply 1 application topically 3 (three) times daily as needed (rash). Do not use on face or genitals     clobetasol ointment 0.05 %  Commonly known as:  TEMOVATE  Apply 1 application topically 2 (two) times daily.     clopidogrel 75 MG tablet  Commonly known as:  PLAVIX  Take 1 tablet (75 mg total) by mouth daily.     feeding supplement (ENSURE COMPLETE) Liqd  Take 237 mLs by mouth 2 (two) times daily between meals.     fluticasone 50 MCG/ACT nasal spray  Commonly known as:  FLONASE  Place 1 spray into both nostrils 2 (two) times daily.     furosemide 20 MG tablet    Commonly known as:  LASIX  Take 1 tablet (20 mg total) by mouth daily.     loratadine 10 MG tablet  Commonly known as:  CLARITIN  Take 10 mg by mouth daily as needed for allergies.     nitroGLYCERIN 0.4 MG SL tablet  Commonly known as:  NITROSTAT  Place 0.4 mg under the tongue every 5 (five) minutes as needed for chest pain.     omeprazole 20 MG capsule  Commonly known as:  PRILOSEC  Take 20 mg by mouth daily.     polyethylene glycol packet  Commonly known as:  MIRALAX / GLYCOLAX  Place 17 g into feeding tube 2 (two) times daily.     PRESCRIPTION MEDICATION  Apply 1 application topically 2 (two) times daily. Sodium fluoride 1% gel - apply thin ribbon to teeth with toothbrush     ranolazine 500 MG 12 hr tablet  Commonly known as:  RANEXA  Take 1 tablet (500 mg total) by mouth 2 (two) times daily.     traMADol 50 MG tablet  Commonly known as:  ULTRAM  Take 1-2 tablets (50-100 mg total) by mouth 2 (two) times daily as needed (pain).     traZODone 50 MG tablet  Commonly known as:  DESYREL  Take 1 tablet (50 mg total) by mouth at bedtime.     triamcinolone ointment 0.5 %  Commonly known as:  KENALOG  Apply 1 application topically 3 (three) times daily.        No orders of the defined types were placed in this encounter.    Immunization History  Administered Date(s) Administered  . H1N1 03/16/2008  . Influenza Split 02/20/2012  . Influenza Whole 01/07/2008, 02/05/2008, 02/01/2009, 01/25/2010  . Influenza,inj,Quad PF,36+ Mos 12/24/2012, 01/11/2014  . Pneumococcal Conjugate-13 04/14/2014  . Pneumococcal Polysaccharide-23 04/23/2005  . Td 02/20/2010  . Tdap 07/08/2012    History  Substance Use Topics  . Smoking status: Current Every Day Smoker -- 40 years    Types: Cigarettes  . Smokeless tobacco: Not on file  . Alcohol Use: No    Family history is noncontributory    Review of Systems  DATA OBTAINED: from patient GENERAL:  no fevers, fatigue, appetite  changes SKIN: No itching, rash or wounds or new bruising EYES: No eye pain, redness, discharge EARS: No earache, tinnitus, change in hearing NOSE: Recent bleeding of short duration as noted above-does have some history of nasal congestion has Flonase as needed   MOUTH/THROAT: No mouth or tooth pain, has sore throat RESPIRATORY:  No cough, wheezing, SOB CARDIAC: No chest pain, palpitations, lower extremity edema  GI: No abdominal pain, No N/V/D or constipation, No heartburn or reflux  GU: No dysuria, frequency or urgency, or incontinence  MUSCULOSKELETAL: No unrelieved bone/joint pain NEUROLOGIC: Intermittent  Headache,  no dizziness or focal weakness PSYCHIATRIC: No overt anxiety or sadness   :                    Physical Exam  temperature 97.5 pulse 85 respirations 16 blood pressure 114/78  GENERAL APPEARANCE: Alert, mod conversant,  No acute distress.  sitting comfortably in his wheelchair  SKIN: No diaphoresis rash--small amount of bruising on arms this appears to be previous blood draws-does have dressing over right carotid area  HEAD: Normocephalic, atraumatic  EYES: Conjunctiva/lids clear. Pupils round, reactive. EOMs intact.  EARS: External exam WNL, canals clear. Hearing grossly normal.  NOSE: No deformity or discharge.--Some dried blood in turbinates   MOUTH/THROAT: Lips w/o lesions--oropharynx is clear mucous membranes moist   Neck-could not really appreciate significant adenopathy  RESPIRATORY: Breathing is even, unlabored. Lung sounds are clear   CARDIOVASCULAR: Heart RRR--heart sounds were somewhat distant-- no murmurs, rubs or gallops. No peripheral edema.   GASTROINTESTINAL: Abdomen is soft, non-tender, not distended w/ normal bowel sounds. GENITOURINARY: Bladder non tender, not distended  MUSCULOSKELETAL: No abnormal joints or musculature NEUROLOGIC:  Cranial nerves 2-12 grossly intact  PSYCHIATRIC: Pleasant and appropriate although apparently there are periods  of confusion   Patient Active Problem List   Diagnosis Date Noted  . Encephalopathy 05/14/2014  . Acute respiratory acidosis   . S/P CABG x 2 05/04/2014  . Acute respiratory failure   . STEMI (ST elevation myocardial infarction) 04/30/2014  . Cardiogenic shock 04/30/2014  . Cardiac arrest 04/30/2014  . VT (ventricular tachycardia) 04/30/2014  . CHB (complete heart block) 04/30/2014  . CAD (coronary artery disease), native coronary artery 04/30/2014  . Wart viral 04/18/2014  . Laceration of index finger 12/04/2012  . Hand eczema 05/26/2012  . Bruising 02/20/2012  . Actinic keratoses 11/14/2011  . PAD (peripheral artery disease) 08/28/2011  . Constipation 07/18/2011  . Sinusitis acute 04/18/2011  . Ventral hernia 01/03/2011  . SWEATING 06/26/2010  . History of transient ischemic attack (TIA)   . CERUMEN IMPACTION 02/20/2010  . DIVERTICULOSIS-COLON 11/01/2009  . RECTAL BLEEDING 11/01/2009  . ABDOMINAL BLOATING 11/01/2009  . ABNORMAL BOWEL SOUNDS 11/01/2009  . ABDOMINAL PAIN-RLQ 11/01/2009  . ABDOMINAL PAIN-LLQ 11/01/2009  . ABDOMINAL PAIN, GENERALIZED 11/01/2009  . ABDOMINAL PAIN, LOWER 11/01/2009  . Rash and nonspecific skin eruption 07/11/2009  . TRANSIENT ISCHEMIC ATTACK, HX OF 03/22/2009  . PVD WITH CLAUDICATION 03/14/2009  . BRONCHITIS, ACUTE 11/26/2008  . Personal history of malignant neoplasm of prostate 08/02/2008  . TOBACCO USE DISORDER/SMOKER-SMOKING CESSATION DISCUSSED 12/08/2007  . ALLERGIC RHINITIS 08/06/2007  . ELEVATED PROSTATE SPECIFIC ANTIGEN 08/06/2007  . Hyperlipidemia 04/22/2007  . Hereditary and idiopathic peripheral neuropathy 04/22/2007  . CORONARY ARTERY DISEASE 04/22/2007  . UPPER RESPIRATORY INFECTION (URI) 04/22/2007  . COPD 04/22/2007  . OSTEOARTHRITIS 04/22/2007  . LOW BACK PAIN 04/22/2007    CBC    Component Value Date/Time   WBC 6.2 05/14/2014 0430   WBC 7.6 12/01/2008 1247   RBC 3.57* 05/14/2014 0430   RBC 3.84* 12/01/2008 1247    HGB 11.7* 05/14/2014 0430   HGB 13.2 12/01/2008 1247   HCT 33.8* 05/14/2014 0430   HCT 38.3* 12/01/2008 1247   PLT 519* 05/14/2014 0430   PLT  205 12/01/2008 1247   MCV 94.7 05/14/2014 0430   MCV 99.9* 12/01/2008 1247   LYMPHSABS 1.7 04/29/2014 2130   LYMPHSABS 0.7* 12/01/2008 1247   MONOABS 0.9 04/29/2014 2130   MONOABS 0.8 12/01/2008 1247   EOSABS 0.6 04/29/2014 2130   EOSABS 0.5 12/01/2008 1247   BASOSABS 0.0 04/29/2014 2130   BASOSABS 0.0 12/01/2008 1247    CMP     Component Value Date/Time   NA 138 05/14/2014 0430   K 4.6 05/14/2014 0430   CL 106 05/14/2014 0430   CO2 29 05/14/2014 0430   GLUCOSE 102* 05/14/2014 0430   GLUCOSE 114* 04/22/2006 0854   BUN 22 05/14/2014 0430   CREATININE 1.28 05/14/2014 0430   CALCIUM 9.0 05/14/2014 0430   PROT 6.9 11/19/2012 0958   ALBUMIN 4.0 11/19/2012 0958   AST 17 11/19/2012 0958   ALT 16 11/19/2012 0958   ALKPHOS 68 11/19/2012 0958   BILITOT 1.1 05/03/2014 1000   GFRNONAA 53* 05/14/2014 0430   GFRAA 61* 05/14/2014 0430    Assessment and Plan  #1-nosebleed-at this point patient appears stable apparently these are transitory episodes-I do note he is on blood thinning agents-we'll check a CBC with platelets tomorrow a.m. monitor for any further episodes.  Possibly dry air could be contributing to this as well.  #2-sore throat-physical exam was fairly benign will start with Chloraseptic spray when necessary and monitor for any changes  STEMI (ST elevation myocardial infarction) Pt's presentation was CP; was taken to cath lab where he developed vfib,shock and coded where he required every intervention possible to survive; pt would not survive redo CABG, PTCA was attempted with some success of Lcircumflex (50% residual blockage after)and distal L main (80% residual  Blockage after procedure). Pt not considered candidate for further PCI; d/c on bblocker, , eliquis, ranexa  at this point appears to be stable   VT (ventricular  tachycardia) Developed during cath;was shocked 13 times, amiodarone and lidocaine load and drips; d/c on amiodarone   Cardiac arrest During cath,required CPR, pressors , IABP for survival   Cardiogenic shock During cath;required CPR, pressors , IABP for survival   CHB (complete heart block) Developed during cath; temporary pacer was placed via R femoral artery   Acute respiratory failure 2/2 to MI and literal heart failure in every parameter;intubated he is smoker with COPD without exacerbation but was txed with nebsin effort to be able to wean him; was made DNR prior to extubation but now that he is in SNF he desires FULL code;  Very vulnerable individual but at this point appears stable    Hyperlipidemia LDL 89, HDL 38.5 on lipitor 40 mg;Continue current dose   S/P CABG x 2 Prior CABG, not a candidate for a repeat   Of note we'll order a CBC as well as metabolic panel tomorrow for updated values.  [id number] note greater than 25 minutes spent assessing patient-discussing his concerns at bedside-reviewing his chart-and coordinating and formulating a plan of care-of note greater than 50% of time spent coordinating plan of care

## 2014-05-20 ENCOUNTER — Emergency Department (HOSPITAL_COMMUNITY): Admission: EM | Admit: 2014-05-20 | Payer: Self-pay | Source: Home / Self Care

## 2014-05-20 ENCOUNTER — Emergency Department (HOSPITAL_COMMUNITY): Payer: Medicare Other

## 2014-05-20 ENCOUNTER — Encounter (HOSPITAL_COMMUNITY): Payer: Self-pay

## 2014-05-20 ENCOUNTER — Inpatient Hospital Stay (HOSPITAL_COMMUNITY)
Admission: EM | Admit: 2014-05-20 | Discharge: 2014-06-05 | DRG: 235 | Disposition: A | Discharge status: Skilled Nursing Facility | Payer: Medicare Other | Attending: Cardiothoracic Surgery | Admitting: Cardiothoracic Surgery

## 2014-05-20 DIAGNOSIS — R9431 Abnormal electrocardiogram [ECG] [EKG]: Secondary | ICD-10-CM | POA: Diagnosis not present

## 2014-05-20 DIAGNOSIS — I255 Ischemic cardiomyopathy: Secondary | ICD-10-CM | POA: Diagnosis present

## 2014-05-20 DIAGNOSIS — E43 Unspecified severe protein-calorie malnutrition: Secondary | ICD-10-CM | POA: Diagnosis not present

## 2014-05-20 DIAGNOSIS — G629 Polyneuropathy, unspecified: Secondary | ICD-10-CM | POA: Diagnosis present

## 2014-05-20 DIAGNOSIS — I4892 Unspecified atrial flutter: Secondary | ICD-10-CM | POA: Diagnosis present

## 2014-05-20 DIAGNOSIS — D6959 Other secondary thrombocytopenia: Secondary | ICD-10-CM | POA: Diagnosis not present

## 2014-05-20 DIAGNOSIS — I214 Non-ST elevation (NSTEMI) myocardial infarction: Secondary | ICD-10-CM | POA: Diagnosis not present

## 2014-05-20 DIAGNOSIS — G5793 Unspecified mononeuropathy of bilateral lower limbs: Secondary | ICD-10-CM | POA: Diagnosis present

## 2014-05-20 DIAGNOSIS — T82898A Other specified complication of vascular prosthetic devices, implants and grafts, initial encounter: Secondary | ICD-10-CM | POA: Diagnosis not present

## 2014-05-20 DIAGNOSIS — I25119 Atherosclerotic heart disease of native coronary artery with unspecified angina pectoris: Secondary | ICD-10-CM | POA: Diagnosis present

## 2014-05-20 DIAGNOSIS — I951 Orthostatic hypotension: Secondary | ICD-10-CM | POA: Diagnosis not present

## 2014-05-20 DIAGNOSIS — J9811 Atelectasis: Secondary | ICD-10-CM | POA: Diagnosis not present

## 2014-05-20 DIAGNOSIS — Z885 Allergy status to narcotic agent status: Secondary | ICD-10-CM

## 2014-05-20 DIAGNOSIS — I2101 ST elevation (STEMI) myocardial infarction involving left main coronary artery: Secondary | ICD-10-CM

## 2014-05-20 DIAGNOSIS — I252 Old myocardial infarction: Secondary | ICD-10-CM

## 2014-05-20 DIAGNOSIS — Z8249 Family history of ischemic heart disease and other diseases of the circulatory system: Secondary | ICD-10-CM

## 2014-05-20 DIAGNOSIS — I739 Peripheral vascular disease, unspecified: Secondary | ICD-10-CM | POA: Diagnosis present

## 2014-05-20 DIAGNOSIS — K921 Melena: Secondary | ICD-10-CM | POA: Diagnosis not present

## 2014-05-20 DIAGNOSIS — Z8546 Personal history of malignant neoplasm of prostate: Secondary | ICD-10-CM

## 2014-05-20 DIAGNOSIS — J449 Chronic obstructive pulmonary disease, unspecified: Secondary | ICD-10-CM | POA: Diagnosis present

## 2014-05-20 DIAGNOSIS — R319 Hematuria, unspecified: Secondary | ICD-10-CM

## 2014-05-20 DIAGNOSIS — Z6822 Body mass index (BMI) 22.0-22.9, adult: Secondary | ICD-10-CM

## 2014-05-20 DIAGNOSIS — I213 ST elevation (STEMI) myocardial infarction of unspecified site: Secondary | ICD-10-CM | POA: Diagnosis present

## 2014-05-20 DIAGNOSIS — Z79899 Other long term (current) drug therapy: Secondary | ICD-10-CM

## 2014-05-20 DIAGNOSIS — Y92230 Patient room in hospital as the place of occurrence of the external cause: Secondary | ICD-10-CM

## 2014-05-20 DIAGNOSIS — E785 Hyperlipidemia, unspecified: Secondary | ICD-10-CM | POA: Diagnosis present

## 2014-05-20 DIAGNOSIS — G609 Hereditary and idiopathic neuropathy, unspecified: Secondary | ICD-10-CM | POA: Diagnosis present

## 2014-05-20 DIAGNOSIS — I10 Essential (primary) hypertension: Secondary | ICD-10-CM | POA: Diagnosis present

## 2014-05-20 DIAGNOSIS — Z951 Presence of aortocoronary bypass graft: Secondary | ICD-10-CM

## 2014-05-20 DIAGNOSIS — Z7901 Long term (current) use of anticoagulants: Secondary | ICD-10-CM

## 2014-05-20 DIAGNOSIS — R310 Gross hematuria: Secondary | ICD-10-CM | POA: Diagnosis present

## 2014-05-20 DIAGNOSIS — Z8673 Personal history of transient ischemic attack (TIA), and cerebral infarction without residual deficits: Secondary | ICD-10-CM

## 2014-05-20 DIAGNOSIS — D62 Acute posthemorrhagic anemia: Secondary | ICD-10-CM | POA: Diagnosis not present

## 2014-05-20 DIAGNOSIS — R200 Anesthesia of skin: Secondary | ICD-10-CM | POA: Diagnosis not present

## 2014-05-20 DIAGNOSIS — I480 Paroxysmal atrial fibrillation: Secondary | ICD-10-CM | POA: Diagnosis not present

## 2014-05-20 DIAGNOSIS — I2510 Atherosclerotic heart disease of native coronary artery without angina pectoris: Secondary | ICD-10-CM

## 2014-05-20 DIAGNOSIS — F17210 Nicotine dependence, cigarettes, uncomplicated: Secondary | ICD-10-CM | POA: Diagnosis present

## 2014-05-20 DIAGNOSIS — W06XXXA Fall from bed, initial encounter: Secondary | ICD-10-CM | POA: Diagnosis not present

## 2014-05-20 DIAGNOSIS — Z9861 Coronary angioplasty status: Secondary | ICD-10-CM

## 2014-05-20 LAB — CBC
HEMATOCRIT: 32.5 % — AB (ref 39.0–52.0)
HEMOGLOBIN: 11.5 g/dL — AB (ref 13.0–17.0)
MCH: 33.1 pg (ref 26.0–34.0)
MCHC: 35.4 g/dL (ref 30.0–36.0)
MCV: 93.7 fL (ref 78.0–100.0)
Platelets: 333 10*3/uL (ref 150–400)
RBC: 3.47 MIL/uL — AB (ref 4.22–5.81)
RDW: 13.4 % (ref 11.5–15.5)
WBC: 4.8 10*3/uL (ref 4.0–10.5)

## 2014-05-20 NOTE — ED Notes (Signed)
Pt. Reports numbness to left fingers with burning sensation to lower left arm. Also reports numbness to lips. Hx of MI x3 weeks ago, d/c last week. Reports similar symptoms with last  MI.

## 2014-05-20 NOTE — ED Notes (Signed)
Per EMS, patient reports around 0430 this AM had left arm burning which resolved and then came back around 2000 tonight. Reports intermittent left arm numbness. Hx MI 04/29/14, discharged last week from ICU to [facility]

## 2014-05-20 NOTE — ED Provider Notes (Signed)
CSN: [id number]     Arrival date & time 05/20/14  2236 History   First MD Initiated Contact with Patient 05/20/14 2240     Chief Complaint  Patient presents with  . Numbness     (Consider location/radiation/quality/duration/timing/severity/associated sxs/prior Treatment) HPI [name] is a 74 y.o. male with known coronary artery disease, status post CABG, recent admission for STEMI and subsequent cath on 1/21 comes in for evaluation of arm numbness and burning. Patient states this morning at 4:30 AM he began to experience a burning sensation on the inside of his left upper arm. He reports that sensation alternated with a numb sensation that went all the way to his left forearm. These symptoms were intermittent throughout the day. He also reports associated central chest burning. He denies overt chest pain, shortness of breath, cough, weakness, syncope. He does admit to having 8 cups of coffee this morning and tea throughout the day. Current everyday smoker.  Past Medical History  Diagnosis Date  . CAD (coronary artery disease)     CABG x 2 with LIMA to Circumflex and vein graft to PDA  07/13/1991  . History of transient ischemic attack (TIA)   . Hyperlipidemia   . Peripheral neuropathy   . PVD (peripheral vascular disease) with claudication   . Carotid bruit     bilateral  . Ventral hernia   . Personal history of prostate cancer   . Diverticulosis   . ALLERGIC RHINITIS   . Osteoarthritis   . COPD (chronic obstructive pulmonary disease)   . Tobacco use disorder, continuous   . STEMI (ST elevation myocardial infarction) 04/29/2014    PTCA Lmain and CFX, in setting of VF/VT arrest and CGS   Past Surgical History  Procedure Laterality Date  . Coronary artery bypass graft  1992  . Lumbar laminectomy  [id number]     X2  . Prostatectomy  05/2008    Dr. Alinda Money and XRT  . Appendectomy    . Tonsillectomy    . Left heart catheterization with coronary angiogram N/A 04/30/2014   Procedure: LEFT HEART CATHETERIZATION WITH CORONARY ANGIOGRAM;  Surgeon: Peter M Martinique, MD; Lmain 90% (s/p PTCA), LAD 80%, CFX 100% (s/p PTCA), RCA OK, LIMA-LAD atretic, SVG-OM 100% (chronic), EF 45%, pt req defib x 13 for VF/VT arrest, CHB w/ tem pacer, IABP and intubation   Family History  Problem Relation Age of Onset  . Hypertension    . Cancer Mother     Colon  . Heart disease Father   . Coronary artery disease Father   . Cancer Sister   . Heart disease Brother    History  Substance Use Topics  . Smoking status: Current Every Day Smoker -- 40 years    Types: Cigarettes  . Smokeless tobacco: Not on file  . Alcohol Use: No    Review of Systems  All other systems reviewed and are negative.  A 10 point review of systems was completed and was negative except for pertinent positives and negatives as mentioned in the history of present illness     Allergies  Codeine sulfate  Home Medications   Prior to Admission medications   Medication Sig Start Date End Date Taking? Authorizing Provider  acetaminophen (TYLENOL) 325 MG tablet Take 975 mg by mouth 3 (three) times daily as needed (pain).   Yes Historical Provider, MD  amiodarone (PACERONE) 200 MG tablet Take 1.5 tablets (300 mg total) by mouth 2 (two) times daily. 05/14/14  Yes Suanne Marker  G Barrett, PA-C  apixaban (ELIQUIS) 5 MG TABS tablet Take 1 tablet (5 mg total) by mouth 2 (two) times daily. 05/14/14  Yes Rhonda G Barrett, PA-C  atorvastatin (LIPITOR) 40 MG tablet Place 1 tablet (40 mg total) into feeding tube daily at 6 PM. 05/14/14  Yes Rhonda G Barrett, PA-C  bisacodyl (DULCOLAX) 10 MG suppository Place 1 suppository (10 mg total) rectally daily as needed for moderate constipation. 05/14/14  Yes Rhonda G Barrett, PA-C  carvedilol (COREG) 3.125 MG tablet Take 1 tablet (3.125 mg total) by mouth 2 (two) times daily with a meal. 05/14/14  Yes Rhonda G Barrett, PA-C  clopidogrel (PLAVIX) 75 MG tablet Take 1 tablet (75 mg total) by mouth  daily. 05/14/14  Yes Rhonda G Barrett, PA-C  feeding supplement, ENSURE COMPLETE, (ENSURE COMPLETE) LIQD Take 237 mLs by mouth 2 (two) times daily between meals. 05/14/14  Yes Rhonda G Barrett, PA-C  fluticasone (FLONASE) 50 MCG/ACT nasal spray Place 1 spray into both nostrils 2 (two) times daily.   Yes Historical Provider, MD  furosemide (LASIX) 20 MG tablet Take 1 tablet (20 mg total) by mouth daily. 05/14/14  Yes Rhonda G Barrett, PA-C  loratadine (CLARITIN) 10 MG tablet Take 10 mg by mouth daily as needed for allergies.    Yes Historical Provider, MD  magnesium hydroxide (MILK OF MAGNESIA) 400 MG/5ML suspension Take 30 mLs by mouth daily as needed for mild constipation.   Yes Historical Provider, MD  nitroGLYCERIN (NITROSTAT) 0.4 MG SL tablet Place 0.4 mg under the tongue every 5 (five) minutes as needed for chest pain.   Yes Historical Provider, MD  omeprazole (PRILOSEC) 20 MG capsule Take 20 mg by mouth daily.     Yes Historical Provider, MD  phenol (CHLORASEPTIC) 1.4 % LIQD Use as directed 1 spray in the mouth or throat 3 (three) times daily as needed for throat irritation / pain.   Yes Historical Provider, MD  polyethylene glycol (MIRALAX / GLYCOLAX) packet Place 17 g into feeding tube 2 (two) times daily. 05/14/14  Yes Rhonda G Barrett, PA-C  ranolazine (RANEXA) 500 MG 12 hr tablet Take 1 tablet (500 mg total) by mouth 2 (two) times daily. 05/14/14  Yes Rhonda G Barrett, PA-C  sodium fluoride (FLUORISHIELD) 1.1 % GEL dental gel Place 1 application onto teeth 2 (two) times daily as needed (to clean teeth).   Yes Historical Provider, MD  sodium phosphate (FLEET) enema Place 1 enema rectally as needed (for constipation). follow package directions   Yes Historical Provider, MD  traMADol (ULTRAM) 50 MG tablet Take 1-2 tablets (50-100 mg total) by mouth 2 (two) times daily as needed (pain). Patient taking differently: Take 50 mg by mouth 2 (two) times daily as needed (pain).  05/14/14  Yes Rhonda G Barrett,  PA-C  traZODone (DESYREL) 50 MG tablet Take 1 tablet (50 mg total) by mouth at bedtime. 05/14/14  Yes Rhonda G Barrett, PA-C  clobetasol cream (TEMOVATE) 1.61 % Apply 1 application topically 3 (three) times daily as needed (rash). Do not use on face or genitals    Historical Provider, MD  clobetasol ointment (TEMOVATE) 0.96 % Apply 1 application topically 2 (two) times daily. Patient not taking: Reported on 04/30/2014 06/23/13   Cassandria Anger, MD  PRESCRIPTION MEDICATION Apply 1 application topically 2 (two) times daily. Sodium fluoride 1% gel - apply thin ribbon to teeth with toothbrush    Historical Provider, MD  triamcinolone ointment (KENALOG) 0.5 % Apply 1 application topically 3 (  three) times daily. Patient not taking: Reported on 05/20/2014 04/14/14   Aleksei Plotnikov V, MD   BP 125/64 mmHg  Pulse 73  Temp(Src) 97.7 F (36.5 C) (Oral)  Resp 17  SpO2 99% Physical Exam  Constitutional: He is oriented to person, place, and time. He appears well-developed and well-nourished.  HENT:  Head: Normocephalic and atraumatic.  Mouth/Throat: Oropharynx is clear and moist.  Eyes: Conjunctivae are normal. Pupils are equal, round, and reactive to light. Right eye exhibits no discharge. Left eye exhibits no discharge. No scleral icterus.  Neck: Neck supple.  Cardiovascular: Normal heart sounds.   Irregularly irregular  Pulmonary/Chest: Effort normal and breath sounds normal. No respiratory distress. He has no wheezes. He has no rales.  Abdominal: Soft. There is no tenderness.  Musculoskeletal: He exhibits no tenderness.  Neurological: He is alert and oriented to person, place, and time.  Cranial Nerves II-XII grossly intact. Motor and sensation 5/5 in all 4 extremities. Extraocular movements intact without nystagmus. Completes finger to nose hand coordination movements without difficulty. No pronator drift.  Skin: Skin is warm and dry. No rash noted.  Psychiatric: He has a normal mood and affect.   Nursing note and vitals reviewed.   ED Course  Procedures (including critical care time) Labs Review Labs Reviewed  CBC - Abnormal; Notable for the following:    RBC 3.47 (*)    Hemoglobin 11.5 (*)    HCT 32.5 (*)    All other components within normal limits  I-STAT TROPOININ, ED - Abnormal; Notable for the following:    Troponin i, poc 0.09 (*)    All other components within normal limits  BASIC METABOLIC PANEL  TROPONIN I    Imaging Review Dg Chest Port 1 View  05/21/2014   CLINICAL DATA:  Left arm burning around 430 this morning resolved in then came back at 2000 hr tonight. Left arm numbness intermittently.  EXAM: PORTABLE CHEST - 1 VIEW  COMPARISON:  05/07/2014  FINDINGS: Postoperative changes in the mediastinum. Normal heart size and pulmonary vascularity. No focal airspace disease or consolidation in the lungs. No blunting of costophrenic angles. No pneumothorax. Mediastinal contours appear intact.  IMPRESSION: No active disease.   Electronically Signed   By: Lucienne Capers M.D.   On: 05/21/2014 00:36     EKG Interpretation   Date/Time:  Thursday May 20 2014 22:36:38 EST Ventricular Rate:  78 PR Interval:    QRS Duration: 105 QT Interval:  434 QTC Calculation: 494 R Axis:   -59 Text Interpretation:  Atrial flutter Abnormal R-wave progression, early  transition Inferior infarct, age indeterminate No significant change since  last tracing Confirmed by [facility]  MD, MARTHA [phone]) on 05/20/2014 10:51:51  PM     Meds given in ED:  Medications  aspirin chewable tablet 324 mg (not administered)  nitroGLYCERIN (NITROSTAT) SL tablet 0.4 mg (not administered)    New Prescriptions   No medications on file   Filed Vitals:   05/20/14 2330 05/20/14 2345 05/21/14 0000 05/21/14 0030  BP: 124/56 127/59 128/67 125/64  Pulse: 64 81 77 73  Temp:      TempSrc:      Resp: 19 21 19 17   SpO2: 98% 98% 98% 99%    MDM  Vitals stable - WNL -afebrile Pt resting  comfortably in ED. Denies discomfort at this time, but reports to attending that this sensation is similar to pain he experienced with his previous MI. Patient given aspirin, nitroglycerin and heparin. Labwork--patient  has elevated i-STAT troponin at 0.09, otherwise noncontributory. EKG is essentially unchanged and shows stable a flutter. Imaging-chest x-ray shows no acute cardiopulmonary pathology.  Discussed patient case and ED presentation with attending, Dr. Canary Brim. Decision made to consult cardiology for potential anginal equivalent. Cardiology will see in ED.  Care transferred to Junius Creamer, NP to follow-up with cardiology and dispo according to their recommendations.  Final diagnoses:  None        Verl Dicker, PA-C 05/21/14 [place], MD 05/22/14 [phone]

## 2014-05-21 DIAGNOSIS — Z9181 History of falling: Secondary | ICD-10-CM | POA: Diagnosis not present

## 2014-05-21 DIAGNOSIS — I4892 Unspecified atrial flutter: Secondary | ICD-10-CM | POA: Diagnosis not present

## 2014-05-21 DIAGNOSIS — J90 Pleural effusion, not elsewhere classified: Secondary | ICD-10-CM | POA: Diagnosis not present

## 2014-05-21 DIAGNOSIS — M1990 Unspecified osteoarthritis, unspecified site: Secondary | ICD-10-CM | POA: Diagnosis not present

## 2014-05-21 DIAGNOSIS — I739 Peripheral vascular disease, unspecified: Secondary | ICD-10-CM | POA: Diagnosis present

## 2014-05-21 DIAGNOSIS — W06XXXA Fall from bed, initial encounter: Secondary | ICD-10-CM | POA: Diagnosis not present

## 2014-05-21 DIAGNOSIS — R319 Hematuria, unspecified: Secondary | ICD-10-CM | POA: Diagnosis not present

## 2014-05-21 DIAGNOSIS — I10 Essential (primary) hypertension: Secondary | ICD-10-CM | POA: Diagnosis present

## 2014-05-21 DIAGNOSIS — I255 Ischemic cardiomyopathy: Secondary | ICD-10-CM | POA: Diagnosis not present

## 2014-05-21 DIAGNOSIS — Z01810 Encounter for preprocedural cardiovascular examination: Secondary | ICD-10-CM | POA: Diagnosis not present

## 2014-05-21 DIAGNOSIS — Z885 Allergy status to narcotic agent status: Secondary | ICD-10-CM | POA: Diagnosis not present

## 2014-05-21 DIAGNOSIS — D62 Acute posthemorrhagic anemia: Secondary | ICD-10-CM | POA: Diagnosis not present

## 2014-05-21 DIAGNOSIS — Z951 Presence of aortocoronary bypass graft: Secondary | ICD-10-CM | POA: Diagnosis not present

## 2014-05-21 DIAGNOSIS — Z8249 Family history of ischemic heart disease and other diseases of the circulatory system: Secondary | ICD-10-CM | POA: Diagnosis not present

## 2014-05-21 DIAGNOSIS — I480 Paroxysmal atrial fibrillation: Secondary | ICD-10-CM | POA: Diagnosis not present

## 2014-05-21 DIAGNOSIS — Z79899 Other long term (current) drug therapy: Secondary | ICD-10-CM | POA: Diagnosis not present

## 2014-05-21 DIAGNOSIS — R278 Other lack of coordination: Secondary | ICD-10-CM | POA: Diagnosis not present

## 2014-05-21 DIAGNOSIS — M6281 Muscle weakness (generalized): Secondary | ICD-10-CM | POA: Diagnosis not present

## 2014-05-21 DIAGNOSIS — R310 Gross hematuria: Secondary | ICD-10-CM | POA: Diagnosis present

## 2014-05-21 DIAGNOSIS — I25119 Atherosclerotic heart disease of native coronary artery with unspecified angina pectoris: Secondary | ICD-10-CM | POA: Diagnosis present

## 2014-05-21 DIAGNOSIS — D682 Hereditary deficiency of other clotting factors: Secondary | ICD-10-CM | POA: Diagnosis not present

## 2014-05-21 DIAGNOSIS — I25700 Atherosclerosis of coronary artery bypass graft(s), unspecified, with unstable angina pectoris: Secondary | ICD-10-CM

## 2014-05-21 DIAGNOSIS — Z8546 Personal history of malignant neoplasm of prostate: Secondary | ICD-10-CM | POA: Diagnosis not present

## 2014-05-21 DIAGNOSIS — Z8673 Personal history of transient ischemic attack (TIA), and cerebral infarction without residual deficits: Secondary | ICD-10-CM | POA: Diagnosis not present

## 2014-05-21 DIAGNOSIS — I214 Non-ST elevation (NSTEMI) myocardial infarction: Secondary | ICD-10-CM | POA: Diagnosis not present

## 2014-05-21 DIAGNOSIS — T82898A Other specified complication of vascular prosthetic devices, implants and grafts, initial encounter: Secondary | ICD-10-CM | POA: Diagnosis present

## 2014-05-21 DIAGNOSIS — F17210 Nicotine dependence, cigarettes, uncomplicated: Secondary | ICD-10-CM | POA: Diagnosis present

## 2014-05-21 DIAGNOSIS — R918 Other nonspecific abnormal finding of lung field: Secondary | ICD-10-CM | POA: Diagnosis not present

## 2014-05-21 DIAGNOSIS — K921 Melena: Secondary | ICD-10-CM | POA: Diagnosis not present

## 2014-05-21 DIAGNOSIS — Z9861 Coronary angioplasty status: Secondary | ICD-10-CM | POA: Diagnosis not present

## 2014-05-21 DIAGNOSIS — Z6822 Body mass index (BMI) 22.0-22.9, adult: Secondary | ICD-10-CM | POA: Diagnosis not present

## 2014-05-21 DIAGNOSIS — C61 Malignant neoplasm of prostate: Secondary | ICD-10-CM | POA: Diagnosis not present

## 2014-05-21 DIAGNOSIS — J189 Pneumonia, unspecified organism: Secondary | ICD-10-CM | POA: Diagnosis not present

## 2014-05-21 DIAGNOSIS — G609 Hereditary and idiopathic neuropathy, unspecified: Secondary | ICD-10-CM | POA: Diagnosis present

## 2014-05-21 DIAGNOSIS — I2111 ST elevation (STEMI) myocardial infarction involving right coronary artery: Secondary | ICD-10-CM | POA: Diagnosis not present

## 2014-05-21 DIAGNOSIS — I2510 Atherosclerotic heart disease of native coronary artery without angina pectoris: Secondary | ICD-10-CM | POA: Diagnosis not present

## 2014-05-21 DIAGNOSIS — J449 Chronic obstructive pulmonary disease, unspecified: Secondary | ICD-10-CM | POA: Diagnosis present

## 2014-05-21 DIAGNOSIS — I252 Old myocardial infarction: Secondary | ICD-10-CM | POA: Diagnosis not present

## 2014-05-21 DIAGNOSIS — G629 Polyneuropathy, unspecified: Secondary | ICD-10-CM | POA: Diagnosis not present

## 2014-05-21 DIAGNOSIS — J9811 Atelectasis: Secondary | ICD-10-CM | POA: Diagnosis not present

## 2014-05-21 DIAGNOSIS — E43 Unspecified severe protein-calorie malnutrition: Secondary | ICD-10-CM | POA: Diagnosis not present

## 2014-05-21 DIAGNOSIS — Y92230 Patient room in hospital as the place of occurrence of the external cause: Secondary | ICD-10-CM | POA: Diagnosis not present

## 2014-05-21 DIAGNOSIS — I483 Typical atrial flutter: Secondary | ICD-10-CM | POA: Diagnosis not present

## 2014-05-21 DIAGNOSIS — D6959 Other secondary thrombocytopenia: Secondary | ICD-10-CM | POA: Diagnosis not present

## 2014-05-21 DIAGNOSIS — I240 Acute coronary thrombosis not resulting in myocardial infarction: Secondary | ICD-10-CM | POA: Diagnosis not present

## 2014-05-21 DIAGNOSIS — I4891 Unspecified atrial fibrillation: Secondary | ICD-10-CM | POA: Diagnosis not present

## 2014-05-21 DIAGNOSIS — E785 Hyperlipidemia, unspecified: Secondary | ICD-10-CM | POA: Diagnosis present

## 2014-05-21 DIAGNOSIS — I951 Orthostatic hypotension: Secondary | ICD-10-CM | POA: Diagnosis not present

## 2014-05-21 DIAGNOSIS — I442 Atrioventricular block, complete: Secondary | ICD-10-CM | POA: Diagnosis not present

## 2014-05-21 DIAGNOSIS — I25110 Atherosclerotic heart disease of native coronary artery with unstable angina pectoris: Secondary | ICD-10-CM | POA: Diagnosis not present

## 2014-05-21 DIAGNOSIS — Z7901 Long term (current) use of anticoagulants: Secondary | ICD-10-CM | POA: Diagnosis not present

## 2014-05-21 DIAGNOSIS — N281 Cyst of kidney, acquired: Secondary | ICD-10-CM | POA: Diagnosis not present

## 2014-05-21 DIAGNOSIS — R2681 Unsteadiness on feet: Secondary | ICD-10-CM | POA: Diagnosis not present

## 2014-05-21 LAB — BASIC METABOLIC PANEL
Anion gap: 10 (ref 5–15)
BUN: 13 mg/dL (ref 6–23)
CALCIUM: 9.2 mg/dL (ref 8.4–10.5)
CO2: 26 mmol/L (ref 19–32)
Chloride: 101 mmol/L (ref 96–112)
Creatinine, Ser: 1.18 mg/dL (ref 0.50–1.35)
GFR calc non Af Amer: 59 mL/min — ABNORMAL LOW (ref >90)
GFR, EST AFRICAN AMERICAN: 68 mL/min — AB (ref >90)
Glucose, Bld: 115 mg/dL — ABNORMAL HIGH (ref 70–99)
POTASSIUM: 3.7 mmol/L (ref 3.5–5.1)
Sodium: 137 mmol/L (ref 135–145)

## 2014-05-21 LAB — TROPONIN I
TROPONIN I: 0.25 ng/mL — AB (ref <0.031)
TROPONIN I: 0.24 ng/mL — AB (ref <0.031)
Troponin I: 0.23 ng/mL — ABNORMAL HIGH (ref <0.031)
Troponin I: 0.25 ng/mL — ABNORMAL HIGH (ref <0.031)

## 2014-05-21 LAB — APTT
APTT: 31 s (ref 24–37)
APTT: 58 s — AB (ref 24–37)
aPTT: 31 seconds (ref 24–37)

## 2014-05-21 LAB — I-STAT TROPONIN, ED
Troponin i, poc: 0.08 ng/mL (ref 0.00–0.08)
Troponin i, poc: 0.09 ng/mL (ref 0.00–0.08)

## 2014-05-21 LAB — HEPARIN LEVEL (UNFRACTIONATED)
Heparin Unfractionated: 1.71 IU/mL — ABNORMAL HIGH (ref 0.30–0.70)
Heparin Unfractionated: >2.2 IU/mL — ABNORMAL HIGH (ref 0.30–0.70)

## 2014-05-21 MED ORDER — LORATADINE 10 MG PO TABS
10.0000 mg | ORAL_TABLET | Freq: Every day | ORAL | Status: DC
  Administered 2014-05-21 – 2014-05-27 (×7): 10 mg via ORAL
  Filled 2014-05-21 (×8): qty 1

## 2014-05-21 MED ORDER — TRAZODONE HCL 50 MG PO TABS
50.0000 mg | ORAL_TABLET | Freq: Every evening | ORAL | Status: DC | PRN
  Administered 2014-05-24 – 2014-05-27 (×3): 50 mg via ORAL
  Filled 2014-05-21 (×6): qty 1

## 2014-05-21 MED ORDER — ONDANSETRON HCL 4 MG/2ML IJ SOLN
4.0000 mg | Freq: Four times a day (QID) | INTRAMUSCULAR | Status: DC | PRN
  Administered 2014-05-23: 4 mg via INTRAVENOUS
  Filled 2014-05-21: qty 2

## 2014-05-21 MED ORDER — AMIODARONE HCL 200 MG PO TABS
200.0000 mg | ORAL_TABLET | Freq: Every day | ORAL | Status: DC
  Administered 2014-05-21: 200 mg via ORAL
  Filled 2014-05-21 (×2): qty 1

## 2014-05-21 MED ORDER — TRAMADOL HCL 50 MG PO TABS
50.0000 mg | ORAL_TABLET | Freq: Once | ORAL | Status: AC
  Administered 2014-05-21: 50 mg via ORAL
  Filled 2014-05-21: qty 1

## 2014-05-21 MED ORDER — PANTOPRAZOLE SODIUM 40 MG PO TBEC
40.0000 mg | DELAYED_RELEASE_TABLET | Freq: Every day | ORAL | Status: DC
  Administered 2014-05-21 – 2014-05-27 (×7): 40 mg via ORAL
  Filled 2014-05-21 (×7): qty 1

## 2014-05-21 MED ORDER — ATORVASTATIN CALCIUM 40 MG PO TABS
40.0000 mg | ORAL_TABLET | Freq: Every day | ORAL | Status: DC
  Administered 2014-05-21 – 2014-06-04 (×13): 40 mg via ORAL
  Filled 2014-05-21 (×16): qty 1

## 2014-05-21 MED ORDER — NITROGLYCERIN 0.4 MG SL SUBL
0.4000 mg | SUBLINGUAL_TABLET | SUBLINGUAL | Status: AC | PRN
  Administered 2014-05-21 – 2014-05-23 (×4): 0.4 mg via SUBLINGUAL
  Filled 2014-05-21 (×3): qty 1

## 2014-05-21 MED ORDER — HEPARIN (PORCINE) IN NACL 100-0.45 UNIT/ML-% IJ SOLN
900.0000 [IU]/h | INTRAMUSCULAR | Status: DC
  Administered 2014-05-21: 1000 [IU]/h via INTRAVENOUS
  Administered 2014-05-21: 800 [IU]/h via INTRAVENOUS
  Administered 2014-05-22: 1000 [IU]/h via INTRAVENOUS
  Administered 2014-05-23: 900 [IU]/h via INTRAVENOUS
  Filled 2014-05-21 (×7): qty 250

## 2014-05-21 MED ORDER — NITROGLYCERIN IN D5W 200-5 MCG/ML-% IV SOLN: 10.0000 ug/min | INTRAVENOUS | Status: DC

## 2014-05-21 MED ORDER — ASPIRIN 81 MG PO CHEW
324.0000 mg | CHEWABLE_TABLET | Freq: Once | ORAL | Status: AC
  Administered 2014-05-21: 324 mg via ORAL
  Filled 2014-05-21: qty 4

## 2014-05-21 MED ORDER — CLOPIDOGREL BISULFATE 75 MG PO TABS
75.0000 mg | ORAL_TABLET | Freq: Every day | ORAL | Status: DC
  Administered 2014-05-21 – 2014-05-24 (×4): 75 mg via ORAL
  Filled 2014-05-21 (×7): qty 1

## 2014-05-21 MED ORDER — ACETAMINOPHEN 325 MG PO TABS
650.0000 mg | ORAL_TABLET | ORAL | Status: DC | PRN
  Administered 2014-05-21 – 2014-05-27 (×20): 650 mg via ORAL
  Filled 2014-05-21 (×19): qty 2

## 2014-05-21 MED ORDER — NITROGLYCERIN 0.4 MG SL SUBL: 0.4000 mg | SUBLINGUAL_TABLET | SUBLINGUAL | Status: DC | PRN

## 2014-05-21 MED ORDER — ASPIRIN EC 81 MG PO TBEC
81.0000 mg | DELAYED_RELEASE_TABLET | Freq: Every day | ORAL | Status: DC
  Administered 2014-05-22 – 2014-05-27 (×6): 81 mg via ORAL
  Filled 2014-05-21 (×7): qty 1

## 2014-05-21 MED ORDER — CLOPIDOGREL BISULFATE 300 MG PO TABS
300.0000 mg | ORAL_TABLET | Freq: Once | ORAL | Status: AC
  Administered 2014-05-21: 300 mg via ORAL
  Filled 2014-05-21: qty 1

## 2014-05-21 MED ORDER — FUROSEMIDE 20 MG PO TABS
20.0000 mg | ORAL_TABLET | Freq: Every day | ORAL | Status: DC
Start: 2014-05-21 — End: 2014-05-28
  Administered 2014-05-21 – 2014-05-27 (×7): 20 mg via ORAL
  Filled 2014-05-21 (×8): qty 1

## 2014-05-21 MED ORDER — CARVEDILOL 3.125 MG PO TABS
3.1250 mg | ORAL_TABLET | Freq: Two times a day (BID) | ORAL | Status: DC
  Administered 2014-05-21 – 2014-05-27 (×14): 3.125 mg via ORAL
  Filled 2014-05-21 (×17): qty 1

## 2014-05-21 NOTE — ED Notes (Signed)
MD Linker at bedside.  

## 2014-05-21 NOTE — Progress Notes (Signed)
ANTICOAGULATION CONSULT NOTE - Initial Consult  Pharmacy Consult for Heparin  Indication: chest pain/ACS  Allergies  Allergen Reactions  . Codeine Sulfate Itching    Patient Measurements: Weight: 158 lb 11.7 oz (72 kg) Heparin Dosing Weight: 70 kg   Vital Signs: Temp: 98.4 F (36.9 C) (02/12 1319) Temp Source: Oral (02/12 1319) BP: 108/68 mmHg (02/12 1319) Pulse Rate: 85 (02/12 1319)  Labs:  Recent Labs  05/20/14 2353 05/21/14 0528 05/21/14 1155 05/21/14 1558  HGB 11.5*  --   --   --   HCT 32.5*  --   --   --   PLT 333  --   --   --   APTT  --  31  --  31  HEPARINUNFRC  --  1.71*  --   --   CREATININE 1.18  --   --   --   TROPONINI 0.23*  --  0.25* 0.25*    Estimated Creatinine Clearance: 55.1 mL/min (by C-G formula based on Cr of 1.18).   Medical History: Past Medical History  Diagnosis Date  . CAD (coronary artery disease)     CABG x 2 with LIMA to Circumflex and vein graft to PDA  07/13/1991  . History of transient ischemic attack (TIA)   . Hyperlipidemia   . Peripheral neuropathy   . PVD (peripheral vascular disease) with claudication   . Carotid bruit     bilateral  . Ventral hernia   . Personal history of prostate cancer   . Diverticulosis   . ALLERGIC RHINITIS   . Osteoarthritis   . COPD (chronic obstructive pulmonary disease)   . Tobacco use disorder, continuous   . STEMI (ST elevation myocardial infarction) 04/29/2014    PTCA Lmain and CFX, in setting of VF/VT arrest and CGS    Medications:  Prescriptions prior to admission  Medication Sig Dispense Refill Last Dose  . acetaminophen (TYLENOL) 325 MG tablet Take 975 mg by mouth 3 (three) times daily as needed (pain).   unknown  . amiodarone (PACERONE) 200 MG tablet Take 1.5 tablets (300 mg total) by mouth 2 (two) times daily. 90 tablet 11 05/20/2014 at Unknown time  . apixaban (ELIQUIS) 5 MG TABS tablet Take 1 tablet (5 mg total) by mouth 2 (two) times daily. 60 tablet 0 05/20/2014 at Unknown  time  . atorvastatin (LIPITOR) 40 MG tablet Place 1 tablet (40 mg total) into feeding tube daily at 6 PM. 30 tablet 11 05/20/2014 at Unknown time  . bisacodyl (DULCOLAX) 10 MG suppository Place 1 suppository (10 mg total) rectally daily as needed for moderate constipation. 12 suppository 0 unknown  . carvedilol (COREG) 3.125 MG tablet Take 1 tablet (3.125 mg total) by mouth 2 (two) times daily with a meal. 60 tablet 11 05/20/2014 at 1700  . clopidogrel (PLAVIX) 75 MG tablet Take 1 tablet (75 mg total) by mouth daily. 30 tablet 11 05/20/2014 at Unknown time  . feeding supplement, ENSURE COMPLETE, (ENSURE COMPLETE) LIQD Take 237 mLs by mouth 2 (two) times daily between meals.   05/20/2014 at Unknown time  . fluticasone (FLONASE) 50 MCG/ACT nasal spray Place 1 spray into both nostrils 2 (two) times daily.   05/20/2014 at Unknown time  . furosemide (LASIX) 20 MG tablet Take 1 tablet (20 mg total) by mouth daily. 30 tablet 11 05/20/2014 at Unknown time  . loratadine (CLARITIN) 10 MG tablet Take 10 mg by mouth daily as needed for allergies.    unknown  . magnesium  hydroxide (MILK OF MAGNESIA) 400 MG/5ML suspension Take 30 mLs by mouth daily as needed for mild constipation.   unknown  . nitroGLYCERIN (NITROSTAT) 0.4 MG SL tablet Place 0.4 mg under the tongue every 5 (five) minutes as needed for chest pain.   unknown  . omeprazole (PRILOSEC) 20 MG capsule Take 20 mg by mouth daily.     05/20/2014 at Unknown time  . phenol (CHLORASEPTIC) 1.4 % LIQD Use as directed 1 spray in the mouth or throat 3 (three) times daily as needed for throat irritation / pain.   unknown  . polyethylene glycol (MIRALAX / GLYCOLAX) packet Place 17 g into feeding tube 2 (two) times daily. 14 each 0 05/20/2014 at Unknown time  . ranolazine (RANEXA) 500 MG 12 hr tablet Take 1 tablet (500 mg total) by mouth 2 (two) times daily. 60 tablet 11 05/20/2014 at Unknown time  . sodium fluoride (FLUORISHIELD) 1.1 % GEL dental gel Place 1 application onto  teeth 2 (two) times daily as needed (to clean teeth).   unknown at Unknown time  . sodium phosphate (FLEET) enema Place 1 enema rectally as needed (for constipation). follow package directions   unknown  . traMADol (ULTRAM) 50 MG tablet Take 1-2 tablets (50-100 mg total) by mouth 2 (two) times daily as needed (pain). (Patient taking differently: Take 50 mg by mouth 2 (two) times daily as needed (pain). ) 30 tablet 0 unknown  . traZODone (DESYREL) 50 MG tablet Take 1 tablet (50 mg total) by mouth at bedtime. 30 tablet 0 05/20/2014 at Unknown time  . clobetasol cream (TEMOVATE) 2.50 % Apply 1 application topically 3 (three) times daily as needed (rash). Do not use on face or genitals   Not Taking at Unknown time  . clobetasol ointment (TEMOVATE) 0.37 % Apply 1 application topically 2 (two) times daily. (Patient not taking: Reported on 04/30/2014) 60 g 3 Not Taking at Unknown time  . PRESCRIPTION MEDICATION Apply 1 application topically 2 (two) times daily. Sodium fluoride 1% gel - apply thin ribbon to teeth with toothbrush   Not Taking at Unknown time  . triamcinolone ointment (KENALOG) 0.5 % Apply 1 application topically 3 (three) times daily. (Patient not taking: Reported on 05/20/2014) 30 g 1 Not Taking at Unknown time    Assessment: 58 YOM with known CAD, s/p CABG and recent admission for STEMI and subsequent cath on 1/21 presented to the ED for arm numbness and burning. Troponin is mildly elevated today. Pharmacy consulted to start heparin infusion for ACS. H/H remains stable. Plt wnl. He is on Eliquis 5 mg twice daily prior to admission. Per patient, last dose of Eliquis was around 8:30 PM on 2/11.   PM: Pt lost IV site around 1600 and heparin was off for about 1.5 hrs, therefore, PTT was low. We'll try to resume and recheck PTT in 6 hrs.   Goal of Therapy:  Heparin level 0.3-0.7 units/ml Monitor platelets by anticoagulation protocol: Yes   Plan:   Restart heparin at 800 units/hr Check PTT in  6 hrs  Onnie Boer, PharmD Pager: [phone] 05/21/2014 5:39 PM

## 2014-05-21 NOTE — H&P (Addendum)
RFA: NSTEMI in setting of atrial flutter HPI: 3yoM with hx of atrial fibrillation on amiodarone and eliquis, HTN, HLD, CAD s/p CABG 1993 (LIMA, SVG OM), PVD s/p intervention to L SFA and R SFA, and recent admission for STEMI on 04/29/14 presents with an NSTEMI in setting of atrial flutter with RVR.  Mr. Pignato states that he woke up this morning per his usual routine when he noticed L arm burning.  This burning worsened with exertion and improved with rest.    He noted that his pulse was elevated to 120s throughout the day and irregular.    Due to persistent worsening of his arm discomfort he decided to seek medical attention.  In the ER his troponin was found to be elevated to 0.23. His ECG showed atrial flutter with variable block and controlled rates.  His arm pain improved so that it is now mild in intensity.  Review of Systems:     Cardiac Review of Systems: {Y] = yes [ ]  = no  Chest Pain [ x ]  Resting SOB [   ] Exertional SOB  [  ]  Orthopnea [  ]   Pedal Edema [   ]    Palpitations [x  ] Syncope  [  ]   Presyncope [   ]  General Review of Systems: [Y] = yes [  ]=no Constitional: recent weight change [  ]; anorexia [  ]; fatigue [  ]; nausea [  ]; night sweats [  ]; fever [  ]; or chills [  ];                                                                     Dental: poor dentition[  ];   Eye : blurred vision [  ]; diplopia [   ]; vision changes [  ];  Amaurosis fugax[  ]; Resp: cough [  ];  wheezing[  ];  hemoptysis[  ]; shortness of breath[  ]; paroxysmal nocturnal dyspnea[  ]; dyspnea on exertion[  ]; or orthopnea[  ];  GI:  gallstones[  ], vomiting[  ];  dysphagia[  ]; melena[  ];  hematochezia [  ]; heartburn[  ];   GU: kidney stones [  ]; hematuria[  ];   dysuria [  ];  nocturia[  ];               Skin: rash [  ], swelling[  ];, hair loss[  ];  peripheral edema[  ];  or itching[  ]; Musculosketetal: myalgias[  ];  joint swelling[  ];  joint erythema[  ];  joint pain[   ];  back pain[  ];  Heme/Lymph: bruising[  ];  bleeding[  ];  anemia[  ];  Neuro: TIA[  ];  headaches[  ];  stroke[  ];  vertigo[  ];  seizures[  ];   paresthesias[  ];  difficulty walking[  ];  Psych:depression[  ]; anxiety[  ];  Endocrine: diabetes[  ];  thyroid dysfunction[  ];  Other:  Past Medical History  Diagnosis Date  . CAD (coronary artery disease)     CABG x 2 with LIMA to Circumflex and vein graft to PDA  07/13/1991  .  History of transient ischemic attack (TIA)   . Hyperlipidemia   . Peripheral neuropathy   . PVD (peripheral vascular disease) with claudication   . Carotid bruit     bilateral  . Ventral hernia   . Personal history of prostate cancer   . Diverticulosis   . ALLERGIC RHINITIS   . Osteoarthritis   . COPD (chronic obstructive pulmonary disease)   . Tobacco use disorder, continuous   . STEMI (ST elevation myocardial infarction) 04/29/2014    PTCA Lmain and CFX, in setting of VF/VT arrest and CGS    @[facility] @   Allergies  Allergen Reactions  . Codeine Sulfate Itching    History   Social History  . Marital Status: Married    Spouse Name: N/A  . Number of Children: N/A  . Years of Education: N/A   Occupational History  . Retired-self employed    Social History Main Topics  . Smoking status: Current Every Day Smoker -- 40 years    Types: Cigarettes  . Smokeless tobacco: Not on file  . Alcohol Use: No  . Drug Use: No  . Sexual Activity: Yes   Other Topics Concern  . Not on file   Social History Narrative    Family History  Problem Relation Age of Onset  . Hypertension    . Cancer Mother     Colon  . Heart disease Father   . Coronary artery disease Father   . Cancer Sister   . Heart disease Brother     PHYSICAL EXAM: Filed Vitals:   05/21/14 0230  BP: 136/48  Pulse: 65  Temp:   Resp: 18   General:  Well appearing. No respiratory difficulty HEENT: normal Neck: supple. no JVD. Carotids 2+ bilat; no bruits. No lymphadenopathy  or thryomegaly appreciated. Cor: PMI nondisplaced. Regular rate & rhythm. No rubs, gallops or murmurs. Lungs: clear Abdomen: soft, nontender, nondistended. No hepatosplenomegaly. No bruits or masses. Good bowel sounds. Extremities: no cyanosis, clubbing, rash, edema Neuro: alert & oriented x 3, cranial nerves grossly intact. moves all 4 extremities w/o difficulty. Affect pleasant.  ECG:  Results for orders placed or performed during the hospital encounter of 05/20/14 (from the past 24 hour(s))  Basic metabolic panel     Status: Abnormal   Collection Time: 05/20/14 11:53 PM  Result Value Ref Range   Sodium 137 135 - 145 mmol/L   Potassium 3.7 3.5 - 5.1 mmol/L   Chloride 101 96 - 112 mmol/L   CO2 26 19 - 32 mmol/L   Glucose, Bld 115 (H) 70 - 99 mg/dL   BUN 13 6 - 23 mg/dL   Creatinine, Ser 1.18 0.50 - 1.35 mg/dL   Calcium 9.2 8.4 - 10.5 mg/dL   GFR calc non Af Amer 59 (L) >90 mL/min   GFR calc Af Amer 68 (L) >90 mL/min   Anion gap 10 5 - 15  CBC     Status: Abnormal   Collection Time: 05/20/14 11:53 PM  Result Value Ref Range   WBC 4.8 4.0 - 10.5 K/uL   RBC 3.47 (L) 4.22 - 5.81 MIL/uL   Hemoglobin 11.5 (L) 13.0 - 17.0 g/dL   HCT 32.5 (L) 39.0 - 52.0 %   MCV 93.7 78.0 - 100.0 fL   MCH 33.1 26.0 - 34.0 pg   MCHC 35.4 30.0 - 36.0 g/dL   RDW 13.4 11.5 - 15.5 %   Platelets 333 150 - 400 K/uL  Troponin I  Status: Abnormal   Collection Time: 05/20/14 11:53 PM  Result Value Ref Range   Troponin I 0.23 (H) <0.031 ng/mL  I-stat troponin, ED     Status: Abnormal   Collection Time: 05/21/14 12:00 AM  Result Value Ref Range   Troponin i, poc 0.09 (HH) 0.00 - 0.08 ng/mL   Comment NOTIFIED PHYSICIAN    Comment 3           Dg Chest Port 1 View  05/21/2014   CLINICAL DATA:  Left arm burning around 430 this morning resolved in then came back at 2000 hr tonight. Left arm numbness intermittently.  EXAM: PORTABLE CHEST - 1 VIEW  COMPARISON:  05/07/2014  FINDINGS: Postoperative changes in  the mediastinum. Normal heart size and pulmonary vascularity. No focal airspace disease or consolidation in the lungs. No blunting of costophrenic angles. No pneumothorax. Mediastinal contours appear intact.  IMPRESSION: No active disease.   Electronically Signed   By: Lucienne Capers M.D.   On: 05/21/2014 00:36   ECG: atrial flutter with variable block, ST depressions in lateral leads consistent with ischemia  Prior Cath 04/2014: 3V disease, nondominant RCA, atretic LIMA, occluded VGs; angioplasty of LM to LCx attempted, unable to pass wire down LAD  ASSESSMENT: 73yoM with extensive CAD admitted with NSTEMI in setting of atrial flutter with RVR.  ECG still with signs of ischemia though patient is comfortable with very mild arm discomfort.  Plan 1. IV Heparin, d/c eliquis 2. Plavix load w/ 300 mg plavix and then continue daily at 75 3. ASA 325 now and then 81 daily 4. C/w amiodarone 5. C/w coreg  6. Start ace in AM 7. SL NTG x 3 then IV nitro if Arm pain persists.   8.  NPO for cath vs. DCCV in am.  Given he had mild pain despite controlled rates on exam would favor LHC. 9.  Continue home meds  Terressa Koyanagi, MD Cardiology Fellow

## 2014-05-21 NOTE — ED Notes (Signed)
Paged cardiology again for update on ETA

## 2014-05-21 NOTE — ED Notes (Signed)
Cardiology states they will be to bedside soon

## 2014-05-21 NOTE — ED Notes (Signed)
Spoke with [name] from pharmacy, states that patient will not start heparin drip until 8am tomorrow due to taking his last Eliquis dose tonight PTA and there needs to be approx 12 hours for it to get out of his system.

## 2014-05-21 NOTE — Progress Notes (Signed)
Patient received to room 234 for diagnosis of chest pain, pt was recently discharged from here and is now doing rehab, no acute distress noted, pts son at bedside.

## 2014-05-21 NOTE — Progress Notes (Signed)
Utilization Review Completed.Donne Anon T2/03/2015

## 2014-05-21 NOTE — Clinical Social Work Note (Signed)
Clinical Social Work Department BRIEF PSYCHOSOCIAL ASSESSMENT 05/21/2014  Patient:  [name], [name]     Account Number:  [id number]     Admit date:  05/20/2014  Clinical Social Worker:  Dian Queen  Date/Time:  05/21/2014 05:00 PM  Referred by:  Physician  Date Referred:  05/21/2014 Referred for  SNF Placement   Other Referral:   Interview type:  Patient Other interview type:   Son [name]    PSYCHOSOCIAL DATA Living Status:  WIFE Admitted from facility:  [facility] Level of care:  [place] Primary support name:  [name] Primary support relationship to patient:  SPOUSE Degree of support available:   Patient lives with his wife.    CURRENT CONCERNS Current Concerns  Post-Acute Placement   Other Concerns:    SOCIAL WORK ASSESSMENT / PLAN Patient is a 74 year old male who lives with his wife. Patient was at [facility] for rehab and is alert and oriented x4.  Patient has strong support network made up of his three sons and his wife.  Patient will be having surgery on Monday, and then he may return home or need rehab again.  Patient is agreeable to going to rehab if it is recommended.  Patient, is hoping he can return home after his surgery.  Patient was pleasant and talkative, and hoping that his surgery will go well so he can return back home.  Patient did not have any other questions at this time.  CSW information given to patient's son [name] in case they have any questions.   Assessment/plan status:  Psychosocial Support/Ongoing Assessment of Needs Other assessment/ plan:   Information/referral to community resources:    PATIENT'S/FAMILY'S RESPONSE TO PLAN OF CARE: Patient and family agreeable to going to SNF for rehab if needed.   Jones Broom. Burke, MSW, [place] 05/21/2014 5:10 PM

## 2014-05-21 NOTE — Progress Notes (Signed)
ANTICOAGULATION CONSULT NOTE - Follow Up Consult  Pharmacy Consult for heparin Indication: NSTEMI and Aflutter  Labs:  Recent Labs  05/20/14 2353 05/21/14 0528 05/21/14 1155 05/21/14 1558 05/21/14 2232  HGB 11.5*  --   --   --   --   HCT 32.5*  --   --   --   --   PLT 333  --   --   --   --   APTT  --  31  --  31 58*  HEPARINUNFRC  --  1.71*  --  >2.20*  --   CREATININE 1.18  --   --   --   --   TROPONINI 0.23*  --  0.25* 0.25* 0.24*     Assessment: 73yo male subtherapeutic on heparin with initial dosing while Eliquis on hold.  Goal of Therapy:  aPTT 66-102 seconds   Plan:  Will increase heparin gtt by 2-3 units/kg/hr to 1000 units/hr and check level/PTT in [place], PharmD, BCPS  05/21/2014,11:23 PM

## 2014-05-21 NOTE — Progress Notes (Addendum)
Interval coverage note: please see H&P this AM by Cardiology Fellow for full detail  74 yo male with h/o a-fib on amio and eliquis, HTN, HLD, CAD s/p CABG 1993 (LIMA, SVG OM), PVD present with NSTEMI in the setting a-flutter with RVR.   Patient appear to have a lot of cardiac history that not immediately clear on original H&P. Last admitted to [facility] on 04/29/2013 with STEMI, cath 1/22 showed critical L main and ostial LAD/LCx in a L dominant system. Chronic total occlusion of mid LCx. Chronically occluded SVG to OM, atretic LIMA to LAD. Cardiogenic shock requiring IABP and IV pressor. Recurrent v-fib with CPR and multiple defibrillations (total of 13 times). Acute respiratory failure require ventilator. Complete heart block requiring temp pacemaker. Partial successful PTCA only of distal L main and ostial LCx (pre PTCA 90%/95% for L main and LCx, post PTCA 80%/50%). Not a candidate for further PCI. Discharged to 05/14/2014. During last admission he went into a-flutter, he was originally placed on ASA and plavix. ASA discontinued, started on plavix with eliquis on 05/07/2014.   Per Dr. Everrett Coombe note on 2/1, can consider redo CABG if continues to improve.   Subjective: Mild L sided chest discomfort, no SOB  Physical exam: Heart: Irregular heart rate Lung: CTA General: A&Ox3   Plan:  Will obtain serial enzyme to trend Continue current medication Unless serial enzyme drastically increase, likely would not be beneficial for relook cath since he just cath on 1/22. Given his significant CAD, ultimate goal should be redo CABG A-flutter started during last admission, it seems patient was discharged in rate controlled a-flutter on 2/5. Currently rate controlled on low dose coreg. He has been on eliquis since 05/07/2014. No immediate indication for DCCV Will discuss with MD regarding further workup.  Almyra Deforest, [facility] 05/21/2014 11:52 AM  I have seen and examined the patient along with Almyra Deforest, PAC.  I have  reviewed the chart, notes and new data.  I agree with PA's note.  Key new complaints: angina with minimal activity Key examination changes: irregular rhythm, 60s at rest, 130s with a few steps Key new findings / data: atrial flutter with variable AV block  PLAN: He has made a remarkable recovery from his illness last month. He has CCS class III-IV angina, triggered by difficult to rate control atrial flutter. Unable to schedule cardioversion today. Plan TEE-CV on Monday. On Eliquis<3 weeks. Continue amiodarone. Will ask Dr. Servando Snare to reevaluate for redo CABG: he seems to have recovered good functional status and would benefit from CABG both with increased longevity and improved QOL, notwithstanding his increased surgical risk.  Sanda Klein, MD, [place] [phone] 05/21/2014, 11:52 AM

## 2014-05-21 NOTE — Progress Notes (Signed)
ANTICOAGULATION CONSULT NOTE - Initial Consult  Pharmacy Consult for Heparin  Indication: chest pain/ACS  Allergies  Allergen Reactions  . Codeine Sulfate Itching    Patient Measurements:   Heparin Dosing Weight: 70 kg   Vital Signs: Temp: 97.7 F (36.5 C) (02/11 2241) Temp Source: Oral (02/11 2241) BP: 125/64 mmHg (02/12 0030) Pulse Rate: 73 (02/12 0030)  Labs:  Recent Labs  05/20/14 2353  HGB 11.5*  HCT 32.5*  PLT 333    Estimated Creatinine Clearance: 49 mL/min (by C-G formula based on Cr of 1.28).   Medical History: Past Medical History  Diagnosis Date  . CAD (coronary artery disease)     CABG x 2 with LIMA to Circumflex and vein graft to PDA  07/13/1991  . History of transient ischemic attack (TIA)   . Hyperlipidemia   . Peripheral neuropathy   . PVD (peripheral vascular disease) with claudication   . Carotid bruit     bilateral  . Ventral hernia   . Personal history of prostate cancer   . Diverticulosis   . ALLERGIC RHINITIS   . Osteoarthritis   . COPD (chronic obstructive pulmonary disease)   . Tobacco use disorder, continuous   . STEMI (ST elevation myocardial infarction) 04/29/2014    PTCA Lmain and CFX, in setting of VF/VT arrest and CGS    Medications:   (Not in a hospital admission)  Assessment: 31 YOM with known CAD, s/p CABG and recent admission for STEMI and subsequent cath on 1/21 presented to the ED for arm numbness and burning. Troponin is mildly elevated today. Pharmacy consulted to start heparin infusion for ACS. H/H remains stable. Plt wnl. He is on Eliquis 5 mg twice daily prior to admission. Per patient, last dose of Eliquis was around 8:30 PM on 2/11.   Goal of Therapy:  Heparin level 0.3-0.7 units/ml Monitor platelets by anticoagulation protocol: Yes   Plan:  -Start heparin infusion at 800 units/hr at 8 AM today. NO bolus -Collect baseline aPTT/HL prior to starting heparin infusion  -F/u 8 hr aPTT/HL  -Monitor daily  aPTT/HL, CBC, and s/s of bleeding  Albertina Parr, PharmD., BCPS Clinical Pharmacist Pager [phone]

## 2014-05-22 LAB — CBC
HCT: 32.8 % — ABNORMAL LOW (ref 39.0–52.0)
Hemoglobin: 11.3 g/dL — ABNORMAL LOW (ref 13.0–17.0)
MCH: 32.3 pg (ref 26.0–34.0)
MCHC: 34.5 g/dL (ref 30.0–36.0)
MCV: 93.7 fL (ref 78.0–100.0)
Platelets: 315 10*3/uL (ref 150–400)
RBC: 3.5 MIL/uL — AB (ref 4.22–5.81)
RDW: 13.4 % (ref 11.5–15.5)
WBC: 4.3 10*3/uL (ref 4.0–10.5)

## 2014-05-22 LAB — APTT: aPTT: 87 seconds — ABNORMAL HIGH (ref 24–37)

## 2014-05-22 LAB — HEPARIN LEVEL (UNFRACTIONATED): Heparin Unfractionated: 1.48 IU/mL — ABNORMAL HIGH (ref 0.30–0.70)

## 2014-05-22 MED ORDER — AMIODARONE HCL 200 MG PO TABS
200.0000 mg | ORAL_TABLET | Freq: Three times a day (TID) | ORAL | Status: DC
Start: 2014-05-22 — End: 2014-05-26
  Administered 2014-05-22 – 2014-05-26 (×12): 200 mg via ORAL
  Filled 2014-05-22 (×14): qty 1

## 2014-05-22 MED ORDER — DOCUSATE SODIUM 100 MG PO CAPS: 100.0000 mg | ORAL_CAPSULE | Freq: Every day | ORAL | Status: DC | PRN

## 2014-05-22 NOTE — Progress Notes (Signed)
Patient ID: [name], male   DOB: [birth year], 74 y.o.   MRN: [id number] Subjective:  Some pain last night non this morning now that he has converted to NSR  74 yo male with h/o a-fib on amio and eliquis, HTN, HLD, CAD s/p CABG 1993 (LIMA, SVG OM), PVD present with NSTEMI in the setting a-flutter with RVR.   Patient appear to have a lot of cardiac history that not immediately clear on original H&P. Last admitted to [facility] on 04/29/2013 with STEMI, cath 1/22 showed critical L main and ostial LAD/LCx in a L dominant system. Chronic total occlusion of mid LCx. Chronically occluded SVG to OM, atretic LIMA to LAD. Cardiogenic shock requiring IABP and IV pressor. Recurrent v-fib with CPR and multiple defibrillations (total of 13 times). Acute respiratory failure require ventilator. Complete heart block requiring temp pacemaker. Partial successful PTCA only of distal L main and ostial LCx (pre PTCA 90%/95% for L main and LCx, post PTCA 80%/50%). Not a candidate for further PCI. Discharged to 05/14/2014. During last admission he went into a-flutter, he was originally placed on ASA and plavix. ASA discontinued, started on plavix with eliquis on 05/07/2014.   Per Dr. Everrett Coombe note on 2/1, can consider redo CABG if continues to improve.   Filed Vitals:   05/22/14 0901  BP: 121/51  Pulse: 74  Temp:   Resp: 18     Affect appropriate Thin white elderly male  HEENT: normal Neck supple with no adenopathy JVP normal no bruits no thyromegaly Lungs clear with no wheezing and good diaphragmatic motion Heart:  S1/S2 no murmur, no rub, gallop or click  Sternotomy PMI normal Abdomen: benighn, BS positve, no tenderness, no AAA no bruit.  No HSM or HJR Distal pulses intact with no bruits No edema Neuro non-focal Skin warm and dry No muscular weakness   Telemetry:  NSR rates 70's  ECG: SR rate 68 inferior T wave inversions    Current facility-administered medications:  .  acetaminophen (TYLENOL)  tablet 650 mg, 650 mg, Oral, Q4H PRN, Elsie Amis, MD, 650 mg at 05/21/14 2343 .  amiodarone (PACERONE) tablet 200 mg, 200 mg, Oral, Daily, Elsie Amis, MD, 200 mg at 05/21/14 1043 .  aspirin EC tablet 81 mg, 81 mg, Oral, Daily, Elsie Amis, MD .  atorvastatin (LIPITOR) tablet 40 mg, 40 mg, Oral, q1800, Elsie Amis, MD, 40 mg at 05/21/14 1732 .  carvedilol (COREG) tablet 3.125 mg, 3.125 mg, Oral, BID WC, Elsie Amis, MD, 3.125 mg at 05/22/14 0901 .  clopidogrel (PLAVIX) tablet 75 mg, 75 mg, Oral, Q breakfast, Elsie Amis, MD, 75 mg at 05/21/14 0830 .  furosemide (LASIX) tablet 20 mg, 20 mg, Oral, Daily, Elsie Amis, MD, 20 mg at 05/21/14 1044 .  heparin ADULT infusion 100 units/mL (25000 units/250 mL), 1,000 Units/hr, Intravenous, Continuous, Rogue Bussing, [facility], Last Rate: 10 mL/hr at 05/21/14 2338, 1,000 Units/hr at 05/21/14 2338 .  loratadine (CLARITIN) tablet 10 mg, 10 mg, Oral, Daily, Elsie Amis, MD, 10 mg at 05/21/14 1043 .  nitroGLYCERIN (NITROSTAT) SL tablet 0.4 mg, 0.4 mg, Sublingual, Q5 min PRN, Viona Gilmore Cartner, PA-C, 0.4 mg at 05/21/14 2036 .  nitroGLYCERIN 50 mg in dextrose 5 % 250 mL (0.2 mg/mL) infusion, 10-200 mcg/min, Intravenous, Titrated, Everlene Balls, MD, Stopped at 05/21/14 0352 .  ondansetron (ZOFRAN) injection 4 mg, 4 mg, Intravenous, Q6H PRN, Elsie Amis, MD .  pantoprazole (PROTONIX) EC tablet 40 mg,  40 mg, Oral, Daily, Elsie Amis, MD, 40 mg at 05/21/14 1043 .  traZODone (DESYREL) tablet 50 mg, 50 mg, Oral, QHS PRN, Elsie Amis, MD Plan:  Flutter:  Converted to NSR increase amiodarone to 200 tid   Was on eliquis prior to admission now on heparin  No reason for TEE/DCC  CAD:  Will ask Dr Servando Snare to see while in hospital regarding redo CABG

## 2014-05-22 NOTE — Progress Notes (Signed)
[place] for Heparin  Indication: chest pain/ACS  Allergies  Allergen Reactions  . Codeine Sulfate Itching    Patient Measurements: Weight: 158 lb 11.7 oz (72 kg) Heparin Dosing Weight: 70 kg   Vital Signs: Temp: 97.7 F (36.5 C) (02/13 1351) Temp Source: Oral (02/13 1351) BP: 106/44 mmHg (02/13 1351) Pulse Rate: 53 (02/13 1351)  Labs:  Recent Labs  05/20/14 2353  05/21/14 0528 05/21/14 1155 05/21/14 1558 05/21/14 2232 05/22/14 0740  HGB 11.5*  --   --   --   --   --  11.3*  HCT 32.5*  --   --   --   --   --  32.8*  PLT 333  --   --   --   --   --  315  APTT  --   < > 31  --  31 58* 87*  HEPARINUNFRC  --   --  1.71*  --  >2.20*  --  1.48*  CREATININE 1.18  --   --   --   --   --   --   TROPONINI 0.23*  --   --  0.25* 0.25* 0.24*  --   < > = values in this interval not displayed.  Estimated Creatinine Clearance: 55.1 mL/min (by C-G formula based on Cr of 1.18).   Assessment: 29 YOM with known CAD, s/p CABG and recent admission for STEMI and subsequent cath on 1/21 presented to the ED for arm numbness and burning. Troponin is mildly elevated today. Pharmacy consulted to start heparin infusion for ACS. H/H remains stable. Plt wnl. He is on Eliquis 5 mg twice daily prior to admission. Per patient, last dose of Eliquis was around 8:30 PM on 2/11.   PTT is within range this PM at 87 seconds  Goal of Therapy:  Heparin level 0.3-0.7 units/ml  PTT = 74 yo 102 seconds Monitor platelets by anticoagulation protocol: Yes   Plan:  Continue heparin at 1000 units / hr Follow up AM labs  Thank you. Anette Guarneri, PharmD [phone]  05/22/2014 2:08 PM

## 2014-05-22 NOTE — Progress Notes (Addendum)
Per nurse report "Pt had large BM. Pt reports blood in stool. Stool in toliet has no visible blood present." Apparently was on the toilet paper while wiping. Stool not melanotic. Pt has h/o hemorrhoids. Stool no longer available for hemocculting. Hgb has been stable by most recent labs. Will offer Colace stool softener, follow Hgb and follow for recurrence. Have asked nursing to hemoccult next stool. Pharmacy is also aware to ensure he is on current dose of heparin. Will continue to monitor closely. If he has subsequent bleeding will need to reassess blood thinners. Dayna Dunn PA-C

## 2014-05-22 NOTE — Progress Notes (Signed)
Pt had large BM. Pt reports blood in stool. Stool in toliet has no visible blood present. MD paged and aware.

## 2014-05-23 DIAGNOSIS — I483 Typical atrial flutter: Secondary | ICD-10-CM

## 2014-05-23 LAB — URINE MICROSCOPIC-ADD ON

## 2014-05-23 LAB — URINALYSIS, ROUTINE W REFLEX MICROSCOPIC
Bilirubin Urine: NEGATIVE
Glucose, UA: NEGATIVE mg/dL
Ketones, ur: 15 mg/dL — AB
NITRITE: NEGATIVE
PH: 6 (ref 5.0–8.0)
Protein, ur: 100 mg/dL — AB
Specific Gravity, Urine: 1.01 (ref 1.005–1.030)
UROBILINOGEN UA: 1 mg/dL (ref 0.0–1.0)

## 2014-05-23 LAB — APTT
aPTT: 116 seconds — ABNORMAL HIGH (ref 24–37)
aPTT: 90 seconds — ABNORMAL HIGH (ref 24–37)

## 2014-05-23 LAB — CBC
HCT: 32.9 % — ABNORMAL LOW (ref 39.0–52.0)
Hemoglobin: 11.4 g/dL — ABNORMAL LOW (ref 13.0–17.0)
MCH: 33.2 pg (ref 26.0–34.0)
MCHC: 34.7 g/dL (ref 30.0–36.0)
MCV: 95.9 fL (ref 78.0–100.0)
Platelets: 263 10*3/uL (ref 150–400)
RBC: 3.43 MIL/uL — ABNORMAL LOW (ref 4.22–5.81)
RDW: 13.5 % (ref 11.5–15.5)
WBC: 4.1 10*3/uL (ref 4.0–10.5)

## 2014-05-23 LAB — OCCULT BLOOD X 1 CARD TO LAB, STOOL: FECAL OCCULT BLD: NEGATIVE

## 2014-05-23 LAB — HEPARIN LEVEL (UNFRACTIONATED): HEPARIN UNFRACTIONATED: 1 [IU]/mL — AB (ref 0.30–0.70)

## 2014-05-23 MED ORDER — FLUTICASONE PROPIONATE 50 MCG/ACT NA SUSP
2.0000 | Freq: Every day | NASAL | Status: DC | PRN
  Administered 2014-05-23 – 2014-05-26 (×3): 2 via NASAL
  Filled 2014-05-23 (×2): qty 16

## 2014-05-23 MED ORDER — SALINE SPRAY 0.65 % NA SOLN
1.0000 | NASAL | Status: DC | PRN
  Administered 2014-05-23 – 2014-05-26 (×3): 1 via NASAL
  Filled 2014-05-23 (×2): qty 44

## 2014-05-23 NOTE — Progress Notes (Signed)
Heparin drip stopped at this time due to two episodes of hematuria with small clots noted, Dr. Elias Else on unit and gave order to stop drip at this time.  Will continue to monitor pt and provide assistance as needed.

## 2014-05-23 NOTE — Progress Notes (Signed)
PROGRESS NOTE  Subjective:   Mr. [name] is a 74 yo who I met several weeks ago when he was in the ICU. He has severe 3V CAD with ciritical LM, ostial LAD / LCX .  Chronic total occlusion of LCX, occluded SVG to OM, atretic LIMA to LAD. He was successfully weaned from the IABP and then the vent. He was discharged on 05/14/14 but readmitted 2/12 with chest pain and mild bump in Troponins     Objective:    Vital Signs:   Temp:  [97.7 F (36.5 C)-98.4 F (36.9 C)] 98.4 F (36.9 C) (02/13 2052) Pulse Rate:  [53-74] 69 (02/13 2052) Resp:  [16-18] 18 (02/13 2052) BP: (106-127)/(44-51) 127/44 mmHg (02/13 2052) SpO2:  [96 %-99 %] 98 % (02/13 2052)  Last BM Date: 05/22/14   24-hour weight change: Weight change:   Weight trends: Filed Weights   05/21/14 0630  Weight: 158 lb 11.7 oz (72 kg)    Intake/Output:  02/13 0701 - 02/14 0700 In: 460 [P.O.:460] Out: 850 [Urine:850] Total I/O In: -  Out: 150 [Urine:150]   Physical Exam: BP 127/44 mmHg  Pulse 69  Temp(Src) 98.4 F (36.9 C) (Oral)  Resp 18  Wt 158 lb 11.7 oz (72 kg)  SpO2 98%  Wt Readings from Last 3 Encounters:  05/21/14 158 lb 11.7 oz (72 kg)  05/13/14 153 lb 3.5 oz (69.5 kg)  04/14/14 172 lb (78.019 kg)    General: Vital signs reviewed and noted. Chronically ill appearing , elderly man. Looks much better than he did 2 weeks ago  Head: Normocephalic, atraumatic.  Eyes: conjunctivae/corneas clear.  EOM's intact.   Throat: normal  Neck:  normal   Lungs:    clear   Heart:  RR with frequent premature beats  Abdomen:  Soft, non-tender, non-distended    Extremities: No edema . Good pulses    Neurologic: A&O X3, CN II - XII are grossly intact.   Psych: Normal     Labs: BMET:  Recent Labs  05/20/14 2353  NA 137  K 3.7  CL 101  CO2 26  GLUCOSE 115*  BUN 13  CREATININE 1.18  CALCIUM 9.2    Liver function tests: No results for input(s): AST, ALT, ALKPHOS, BILITOT, PROT, ALBUMIN in the  last 72 hours. No results for input(s): LIPASE, AMYLASE in the last 72 hours.  CBC:  Recent Labs  05/22/14 0740 05/23/14 0424  WBC 4.3 4.1  HGB 11.3* 11.4*  HCT 32.8* 32.9*  MCV 93.7 95.9  PLT 315 263    Cardiac Enzymes:  Recent Labs  05/20/14 2353 05/21/14 1155 05/21/14 1558 05/21/14 2232  TROPONINI 0.23* 0.25* 0.25* 0.24*    Coagulation Studies: No results for input(s): LABPROT, INR in the last 72 hours.  Other: Invalid input(s): POCBNP No results for input(s): DDIMER in the last 72 hours. No results for input(s): HGBA1C in the last 72 hours. No results for input(s): CHOL, HDL, LDLCALC, TRIG, CHOLHDL in the last 72 hours. No results for input(s): TSH, T4TOTAL, T3FREE, THYROIDAB in the last 72 hours.  Invalid input(s): FREET3 No results for input(s): VITAMINB12, FOLATE, FERRITIN, TIBC, IRON, RETICCTPCT in the last 72 hours.   Other results:  tele  ( personally reviewed )  -  NSR with PACs     Medications:    Infusions: . heparin 900 Units/hr (05/23/14 0650)  . nitroGLYCERIN Stopped (05/21/14 0352)    Scheduled Medications: . amiodarone  200 mg Oral  TID  . aspirin EC  81 mg Oral Daily  . atorvastatin  40 mg Oral q1800  . carvedilol  3.125 mg Oral BID WC  . clopidogrel  75 mg Oral Q breakfast  . furosemide  20 mg Oral Daily  . loratadine  10 mg Oral Daily  . pantoprazole  40 mg Oral Daily    Assessment/ Plan:   Active Problems:   NSTEMI (non-ST elevated myocardial infarction)  1. CAD:  Has severe CAD, not amenable to PCI.  He has been seen by Dr Servando Snare several weeks but was not a candidate for re-do CABG at that time.  He continues to have some CP.   Will start IV NTG.  He is on heparin  2. Atrial flutter:  Is back in NSR ,  Continue amiodarone 200 TID for now   3. Hyperlipidemia: continue atorvastatin 40    Disposition:  Length of Stay: 2  Thayer Headings, Brooke Bonito., MD, [facility] 05/23/2014, 8:24 AM Office [phone] Pager [phone]

## 2014-05-23 NOTE — Plan of Care (Signed)
Problem: Consults Goal: Chest Pain Patient Education (See Patient Education module for education specifics.) Patient c/o pain that is mostly located in his right shoulder-describes it as "an aching, I think from being on this bed"  Has not c/o chest pain tinight

## 2014-05-23 NOTE — Progress Notes (Signed)
Called to pts room for c/o chest pain that radiates into my left arm and some numbness and tingling around my mouth, pts blood pressure 102/48, given NTG sl x 2 .

## 2014-05-23 NOTE — Progress Notes (Signed)
ANTICOAGULATION CONSULT NOTE - Follow Up Consult  Pharmacy Consult for heparin Indication: NSTEMI and Aflutter   Labs:  Recent Labs  05/20/14 2353  05/21/14 1155 05/21/14 1558 05/21/14 2232 05/22/14 0740 05/23/14 0424  HGB 11.5*  --   --   --   --  11.3* 11.4*  HCT 32.5*  --   --   --   --  32.8* 32.9*  PLT 333  --   --   --   --  315 263  APTT  --   < >  --  31 58* 87* 116*  HEPARINUNFRC  --   < >  --  >2.20*  --  1.48* 1.00*  CREATININE 1.18  --   --   --   --   --   --   TROPONINI 0.23*  --  0.25* 0.25* 0.24*  --   --   < > = values in this interval not displayed.   Assessment: 74yo male now supratherapeutic on heparin after one PTT at goal; lab drawn correctly.  Goal of Therapy:  aPTT 66-102 seconds   Plan:  Will decrease heparin gtt slightly to 900 units/hr and check level in [place], PharmD, BCPS  05/23/2014,6:50 AM

## 2014-05-23 NOTE — Progress Notes (Signed)
Notified Dr. Elias Else re: pt passed small blood clot and blood into toilet, will continue to monitor pt and assist as needed

## 2014-05-23 NOTE — Progress Notes (Signed)
[place] for Heparin  Indication: chest pain/ACS  Allergies  Allergen Reactions  . Codeine Sulfate Itching    Patient Measurements: Weight: 158 lb 11.7 oz (72 kg) Heparin Dosing Weight: 70 kg   Vital Signs: Temp: 98.2 F (36.8 C) (02/14 1432) Temp Source: Oral (02/14 1432) BP: 111/44 mmHg (02/14 1432) Pulse Rate: 66 (02/14 1658)  Labs:  Recent Labs  05/20/14 2353  05/21/14 1155 05/21/14 1558 05/21/14 2232 05/22/14 0740 05/23/14 0424 05/23/14 1514  HGB 11.5*  --   --   --   --  11.3* 11.4*  --   HCT 32.5*  --   --   --   --  32.8* 32.9*  --   PLT 333  --   --   --   --  315 263  --   APTT  --   < >  --  31 58* 87* 116* 90*  HEPARINUNFRC  --   < >  --  >2.20*  --  1.48* 1.00*  --   CREATININE 1.18  --   --   --   --   --   --   --   TROPONINI 0.23*  --  0.25* 0.25* 0.24*  --   --   --   < > = values in this interval not displayed.  Estimated Creatinine Clearance: 55.1 mL/min (by C-G formula based on Cr of 1.18).   Assessment: 74 YOM with known CAD, s/p CABG and recent admission for STEMI and subsequent cath on 1/21 presented to the ED for arm numbness and burning. Troponin is mildly elevated today. Pharmacy consulted to start heparin infusion for ACS. H/H remains stable. Plt wnl. He is on Eliquis 5 mg twice daily prior to admission. Per patient, last dose of Eliquis was around 8:30 PM on 2/11.   PTT is within range this PM at 90 seconds  Goal of Therapy:  Heparin level 0.3-0.7 units/ml  PTT = 74 yo 102 seconds Monitor platelets by anticoagulation protocol: Yes   Plan:  Continue heparin at 900 units / hr Follow up AM labs  Thank you. Anette Guarneri, PharmD [phone]  05/23/2014 5:13 PM

## 2014-05-24 ENCOUNTER — Other Ambulatory Visit: Payer: Self-pay | Admitting: *Deleted

## 2014-05-24 DIAGNOSIS — I10 Essential (primary) hypertension: Secondary | ICD-10-CM

## 2014-05-24 DIAGNOSIS — I2510 Atherosclerotic heart disease of native coronary artery without angina pectoris: Secondary | ICD-10-CM

## 2014-05-24 DIAGNOSIS — E785 Hyperlipidemia, unspecified: Secondary | ICD-10-CM

## 2014-05-24 LAB — CBC
HEMATOCRIT: 31.7 % — AB (ref 39.0–52.0)
Hemoglobin: 10.9 g/dL — ABNORMAL LOW (ref 13.0–17.0)
MCH: 32.5 pg (ref 26.0–34.0)
MCHC: 34.4 g/dL (ref 30.0–36.0)
MCV: 94.6 fL (ref 78.0–100.0)
PLATELETS: 216 10*3/uL (ref 150–400)
RBC: 3.35 MIL/uL — ABNORMAL LOW (ref 4.22–5.81)
RDW: 13.6 % (ref 11.5–15.5)
WBC: 4.2 10*3/uL (ref 4.0–10.5)

## 2014-05-24 LAB — PLATELET INHIBITION P2Y12: PLATELET FUNCTION P2Y12: 123 [PRU] — AB (ref 194–418)

## 2014-05-24 LAB — APTT: aPTT: 28 seconds (ref 24–37)

## 2014-05-24 LAB — HEPARIN LEVEL (UNFRACTIONATED)

## 2014-05-24 NOTE — Progress Notes (Signed)
Patient ID: [name], male   DOB: 11-18-1940, 74 y.o.   MRN: [id number]      [address]       [place] [address]             [phone]        [facility] Medical Record #[id number] Date of Birth: Aug 21, 1940  Referring: Dr Cathie Olden  Primary Care: Walker Kehr, MD  Chief Complaint:    Chief Complaint  Patient presents with  . Numbness    History of Present Illness:     Asked by Dr. Acie Fredrickson to see the patient1/27/2016  to consider treatment options. At the time the patient was  sedated on a ventilator, just had intra-aortic balloon pump removed.  The patient is a [name] with history of CAD s/p CABG 1993 with LIMA to the circumflex and reversed saphenous vein graft to the PDA 07/13/1991 by me when he was 50 with unstable angina. In 1999 the LIMA graft occluded but the vein graft supplied his whole circumflex system so he has not had problems. He has PVD s/p PCI to left SFA and right SFA, hyperlipidemia.  He presented to ED via EMS for inf-posterior STEMI and complete HB in late January.  He apparently developed chest pain about PTA, left sided, pressure like and associated with N/V. Upon arrival to ED CP still 6/10, he is s/p ASA 325 mg, 4 mg IV Morphine, 1 x Nitro by EMS. His BP 80/40s with HR 30s in CHB, Dopamine started and uptitrated to 15, with improvement of BP to systolic 818E and HR to high 40s. Pt also required morphine 2 mg x1, Zofran 4 mg x2 in ED due to persistent N/V. Plavix loading attempted x 2 (300 mg each) but pt threw up each time. Therefore, NO PLAVIX was taken in by patient. Hepatin gtt started and patient brought to cath lab emergently. He was found to have critical LM disease and LIMA graft was found to be atretic. SVGs found to be occluded but probably chronic. Patient had frequent VT/VF, requiring multiple defib x13, Amio bolus -> gtt and Lido bolus -> gtt. He was subsequently intubated for airway protection. Temp wire  pacer and IABP were inserted.  He slowly recovered and was discharged to nursing home. He was readmitted with arm pain. Since last seen he is much improved functional status , has not been smoking.   Current Activity/ Functional Status: Patient is independent with mobility/ambulation, transfers, ADL's, IADL's.   Zubrod Score: At the time of surgery this patient's most appropriate activity status/level should be described as: []     0    Normal activity, no symptoms []     1    Restricted in physical strenuous activity but ambulatory, able to do out light work [x]     2    Ambulatory and capable of self care, unable to do work activities, up and about                 more than 50%  Of the time                            []     3    Only limited self care, in bed greater than 50% of waking hours []     4    Completely disabled, no self care, confined to bed or chair []     5  Moribund  Past Medical History  Diagnosis Date  . CAD (coronary artery disease)     CABG x 2 with LIMA to Circumflex and vein graft to PDA  07/13/1991  . History of transient ischemic attack (TIA)   . Hyperlipidemia   . Peripheral neuropathy   . PVD (peripheral vascular disease) with claudication   . Carotid bruit     bilateral  . Ventral hernia   . Personal history of prostate cancer   . Diverticulosis   . ALLERGIC RHINITIS   . Osteoarthritis   . COPD (chronic obstructive pulmonary disease)   . Tobacco use disorder, continuous   . STEMI (ST elevation myocardial infarction) 04/29/2014    PTCA Lmain and CFX, in setting of VF/VT arrest and CGS    Past Surgical History  Procedure Laterality Date  . Coronary artery bypass graft  1992  . Lumbar laminectomy  [id number]     X2  . Prostatectomy  05/2008    Dr. Alinda Money and XRT  . Appendectomy    . Tonsillectomy    . Left heart catheterization with coronary angiogram N/A 04/30/2014    Procedure: LEFT HEART CATHETERIZATION WITH CORONARY ANGIOGRAM;  Surgeon: Peter M  Martinique, MD; Lmain 90% (s/p PTCA), LAD 80%, CFX 100% (s/p PTCA), RCA OK, LIMA-LAD atretic, SVG-OM 100% (chronic), EF 45%, pt req defib x 13 for VF/VT arrest, CHB w/ tem pacer, IABP and intubation    History  Smoking status  . Current Every Day Smoker -- 40 years  . Types: Cigarettes  Smokeless tobacco  . Not on file    History  Alcohol Use No    History   Social History  . Marital Status: Married    Spouse Name: N/A  . Number of Children: N/A  . Years of Education: N/A   Occupational History  . Retired-self employed    Social History Main Topics  . Smoking status: Current Every Day Smoker -- 40 years    Types: Cigarettes  . Smokeless tobacco: Not on file  . Alcohol Use: No  . Drug Use: No  . Sexual Activity: Yes   Other Topics Concern  . Not on file   Social History Narrative    Allergies  Allergen Reactions  . Codeine Sulfate Itching    Current Facility-Administered Medications  Medication Dose Route Frequency Provider Last Rate Last Dose  . acetaminophen (TYLENOL) tablet 650 mg  650 mg Oral Q4H PRN Elsie Amis, MD   650 mg at 05/24/14 1042  . amiodarone (PACERONE) tablet 200 mg  200 mg Oral TID Josue Hector, MD   200 mg at 05/24/14 1033  . aspirin EC tablet 81 mg  81 mg Oral Daily Elsie Amis, MD   81 mg at 05/24/14 1033  . atorvastatin (LIPITOR) tablet 40 mg  40 mg Oral q1800 Elsie Amis, MD   40 mg at 05/23/14 1658  . carvedilol (COREG) tablet 3.125 mg  3.125 mg Oral BID WC Elsie Amis, MD   3.125 mg at 05/24/14 0850  . docusate sodium (COLACE) capsule 100 mg  100 mg Oral Daily PRN Dayna N Dunn, PA-C      . fluticasone (FLONASE) 50 MCG/ACT nasal spray 2 spray  2 spray Each Nare Daily PRN Dayna N Dunn, PA-C   2 spray at 05/23/14 1758  . furosemide (LASIX) tablet 20 mg  20 mg Oral Daily Elsie Amis, MD   20 mg at 05/24/14 1033  . loratadine (CLARITIN)  tablet 10 mg  10 mg Oral Daily Elsie Amis, MD   10 mg at 05/24/14 1033  .  nitroGLYCERIN 50 mg in dextrose 5 % 250 mL (0.2 mg/mL) infusion  10-200 mcg/min Intravenous Titrated Everlene Balls, MD   Stopped at 05/21/14 0352  . ondansetron (ZOFRAN) injection 4 mg  4 mg Intravenous Q6H PRN Elsie Amis, MD   4 mg at 05/23/14 8099  . pantoprazole (PROTONIX) EC tablet 40 mg  40 mg Oral Daily Elsie Amis, MD   40 mg at 05/24/14 1033  . sodium chloride (OCEAN) 0.65 % nasal spray 1 spray  1 spray Each Nare PRN Dayna N Dunn, PA-C   1 spray at 05/23/14 1757  . traZODone (DESYREL) tablet 50 mg  50 mg Oral QHS PRN Elsie Amis, MD        Prescriptions prior to admission  Medication Sig Dispense Refill Last Dose  . acetaminophen (TYLENOL) 325 MG tablet Take 975 mg by mouth 3 (three) times daily as needed (pain).   unknown  . amiodarone (PACERONE) 200 MG tablet Take 1.5 tablets (300 mg total) by mouth 2 (two) times daily. 90 tablet 11 05/20/2014 at Unknown time  . apixaban (ELIQUIS) 5 MG TABS tablet Take 1 tablet (5 mg total) by mouth 2 (two) times daily. 60 tablet 0 05/20/2014 at Unknown time  . atorvastatin (LIPITOR) 40 MG tablet Place 1 tablet (40 mg total) into feeding tube daily at 6 PM. 30 tablet 11 05/20/2014 at Unknown time  . bisacodyl (DULCOLAX) 10 MG suppository Place 1 suppository (10 mg total) rectally daily as needed for moderate constipation. 12 suppository 0 unknown  . carvedilol (COREG) 3.125 MG tablet Take 1 tablet (3.125 mg total) by mouth 2 (two) times daily with a meal. 60 tablet 11 05/20/2014 at 1700  . clopidogrel (PLAVIX) 75 MG tablet Take 1 tablet (75 mg total) by mouth daily. 30 tablet 11 05/20/2014 at Unknown time  . feeding supplement, ENSURE COMPLETE, (ENSURE COMPLETE) LIQD Take 237 mLs by mouth 2 (two) times daily between meals.   05/20/2014 at Unknown time  . fluticasone (FLONASE) 50 MCG/ACT nasal spray Place 1 spray into both nostrils 2 (two) times daily.   05/20/2014 at Unknown time  . furosemide (LASIX) 20 MG tablet Take 1 tablet (20 mg total) by mouth  daily. 30 tablet 11 05/20/2014 at Unknown time  . loratadine (CLARITIN) 10 MG tablet Take 10 mg by mouth daily as needed for allergies.    unknown  . magnesium hydroxide (MILK OF MAGNESIA) 400 MG/5ML suspension Take 30 mLs by mouth daily as needed for mild constipation.   unknown  . nitroGLYCERIN (NITROSTAT) 0.4 MG SL tablet Place 0.4 mg under the tongue every 5 (five) minutes as needed for chest pain.   unknown  . omeprazole (PRILOSEC) 20 MG capsule Take 20 mg by mouth daily.     05/20/2014 at Unknown time  . phenol (CHLORASEPTIC) 1.4 % LIQD Use as directed 1 spray in the mouth or throat 3 (three) times daily as needed for throat irritation / pain.   unknown  . polyethylene glycol (MIRALAX / GLYCOLAX) packet Place 17 g into feeding tube 2 (two) times daily. 14 each 0 05/20/2014 at Unknown time  . ranolazine (RANEXA) 500 MG 12 hr tablet Take 1 tablet (500 mg total) by mouth 2 (two) times daily. 60 tablet 11 05/20/2014 at Unknown time  . sodium fluoride (FLUORISHIELD) 1.1 % GEL dental gel Place 1 application onto  teeth 2 (two) times daily as needed (to clean teeth).   unknown at Unknown time  . sodium phosphate (FLEET) enema Place 1 enema rectally as needed (for constipation). follow package directions   unknown  . traMADol (ULTRAM) 50 MG tablet Take 1-2 tablets (50-100 mg total) by mouth 2 (two) times daily as needed (pain). (Patient taking differently: Take 50 mg by mouth 2 (two) times daily as needed (pain). ) 30 tablet 0 unknown  . traZODone (DESYREL) 50 MG tablet Take 1 tablet (50 mg total) by mouth at bedtime. 30 tablet 0 05/20/2014 at Unknown time  . clobetasol cream (TEMOVATE) 2.35 % Apply 1 application topically 3 (three) times daily as needed (rash). Do not use on face or genitals   Not Taking at Unknown time  . clobetasol ointment (TEMOVATE) 3.61 % Apply 1 application topically 2 (two) times daily. (Patient not taking: Reported on 04/30/2014) 60 g 3 Not Taking at Unknown time  . PRESCRIPTION  MEDICATION Apply 1 application topically 2 (two) times daily. Sodium fluoride 1% gel - apply thin ribbon to teeth with toothbrush   Not Taking at Unknown time  . triamcinolone ointment (KENALOG) 0.5 % Apply 1 application topically 3 (three) times daily. (Patient not taking: Reported on 05/20/2014) 30 g 1 Not Taking at Unknown time    Family History  Problem Relation Age of Onset  . Hypertension    . Cancer Mother     Colon  . Heart disease Father   . Coronary artery disease Father   . Cancer Sister   . Heart disease Brother      Review of Systems:      Cardiac Review of Systems: Y or N  Chest Pain [  y  ]  Resting SOB [  n ] Exertional SOB  [  y  Orthopnea [ n ]   Pedal Edema Blue.Reese   ]    Palpitations Blue.Reese ] Syncope  [ arrest ]   Presyncope [  n ]  General Review of Systems: [Y] = yes [  ]=no Constitional: recent weight change [ y ]; anorexia [  ]; fatigue [  ]; nausea [  ]; night sweats [  ]; fever [n  ]; or chills [ n ]                                                               Dental: poor dentition[  ]; Last Dentist visit:   Eye : blurred vision [  ]; diplopia [   ]; vision changes [  ];  Amaurosis fugax[  ]; Resp: cough [  ];  wheezing[ n;  hemoptysis[  n; shortness of breath[n  ]; paroxysmal nocturnal dyspnea[  y]; dyspnea on exertion[ y ]; or orthopnea[  ];  GI:  gallstones[  ], vomiting[  ];  dysphagia[  ]; melena[  ];  hematochezia [  ]; heartburn[  ];   Hx of  Colonoscopy[  ]; GU: kidney stones [  ]; hematuria[ y ];   dysuria [  ];  nocturia[  ];  history of     obstruction [  ]; urinary frequency Blue.Reese  ]             Skin: rash, swelling[  ];, hair loss[  ];  peripheral edema[  ];  or itching[  ]; Musculosketetal: myalgias[  ];  joint swelling[  ];  joint erythema[  ];  joint pain[n  ];  back pain[  ];  Heme/Lymph: bruising[  ];  bleeding[  ];  anemia[  ];  Neuro: TIA[  ];  headaches[  ];  stroke[  ];  vertigo[  ];  seizures[ n ];   paresthesias[  ];  difficulty walking[y   ];  Psych:depression[ y ]; anxiety[ y ];  Endocrine: diabetes[n ];  thyroid dysfunction[  n];  Immunizations: Flu [  y]; Pneumococcal[ y ];  Other:  Physical Exam: BP 103/53 mmHg  Pulse 65  Temp(Src) 97.9 F (36.6 C) (Oral)  Resp 18  Wt 158 lb 11.7 oz (72 kg)  SpO2 99%   General appearance: alert, cooperative and appears older than stated age Head: Normocephalic, without obvious abnormality, atraumatic Neck: no adenopathy, no carotid bruit, no JVD, supple, symmetrical, trachea midline and thyroid not enlarged, symmetric, no tenderness/mass/nodules Lymph nodes: Cervical, supraclavicular, and axillary nodes normal. Resp: diminished breath sounds bibasilar Back: symmetric, no curvature. ROM normal. No CVA tenderness. Cardio: regular rate and rhythm, S1, S2 normal, no murmur, click, rub or gallop GI: soft, non-tender; bowel sounds normal; no masses,  no organomegaly Extremities: extremities normal, atraumatic, no cyanosis or edema and Homans sign is negative, no sign of DVT Neurologic: Alert and oriented X 3, normal strength and tone. Normal symmetric reflexes. Normal coordination and gait Palpable +1 dp and PT pulses  Diagnostic Studies & Laboratory data:     Recent Radiology Findings:   No results found.   05/24/14 1028     Resulting lab: SUNQUEST   Reference range: 194 - 418 PRU   Value: 123 (Low)   Comment:     The literature has shown a direct  correlation of PRU values over  230 with higher risks of  thrombotic events. Lower PRU  values are associated with  platelet inhibition.      Recent Lab Findings: Lab Results  Component Value Date   WBC 4.2 05/24/2014   HGB 10.9* 05/24/2014   HCT 31.7* 05/24/2014   PLT 216 05/24/2014   GLUCOSE 115* 05/20/2014   CHOL 146 11/19/2012   TRIG 93.0 11/19/2012   HDL 38.50* 11/19/2012   LDLCALC 89 11/19/2012   ALT 16 11/19/2012   AST 17 11/19/2012   NA 137 05/20/2014   K 3.7 05/20/2014   CL 101 05/20/2014    CREATININE 1.18 05/20/2014   BUN 13 05/20/2014   CO2 26 05/20/2014   TSH 1.14 04/12/2011   INR 1.01 04/29/2014   HGBA1C 5.7 07/11/2006   .hgbai  CATH: Procedure: Left Heart Cath, Selective Coronary Angiography, SVG and LIMA angiography, LV angiography, PTCA of the left main coronary artery. Insertion of temporary pacemaker. Insertion of IABP. Multiple episodes of ventricular fibrillation requiring defibrillation x 13.   Indication: [name] with history of remote CABG in 1993 presents with Acute inferior STEMI associated with cardiogenic shock, complete heart block.   Diagnostic Procedure Details: The right groin was prepped, draped, and anesthetized with 1% lidocaine. Using the modified Seldinger technique, a 6 French sheath was introduced into the right femoral artery. Standard Judkins catheters were used for selective coronary and graft angiography and left ventriculography. Catheter exchanges were performed over a wire.   PROCEDURAL FINDINGS Hemodynamics: AO 109/44 mm Hg LV 118/27 mm Hg  Coronary angiography: Coronary dominance: left  Left mainstem: There is a 90% distal  left main stenosis with heavy calcification.   Left anterior descending (LAD): There is an 80% ostial LAD stenosis followed by 2 aneurysmal segments. The remainder of the LAD is without significant disease. The first and second diagonals are without significant disease.   Left circumflex (LCx): The LCx gives off a moderately large OM and then is occluded. There is a 95% ostial LCx stenosis.   Right coronary artery (RCA): The RCA is a nondominant vessel with an anterior takeoff. It is patent.   SVG to OM is occluded chronically.  LIMA to the LAD is atretic.   Left ventriculography: Left ventricular systolic function is abnormal, there is severe inferior wall hypokinesis. LVEF is estimated at 45%, there is no significant mitral regurgitation   PCI Procedure Note: During diagnostic angiogram the  patient developed progressive cardiogenic shock and complete heart block. A Venous sheath was placed in the right femoral vein and a temporary transvenous pacemaker was inserted into the RV apex under fluoro guidance. The patient developed multiple episodes of ventricular fibrillation requiring a total of 13 DC shocks. He was loaded with IV amiodarone and lidocaine. He received CPR and pressor support. He required several amps of IV epinephrine. An additional arterial access was obtained via the left femoral artery and an IABP was placed via this access for hemodynamic support. The patient was intubated for control of his airway and ventilator support. Once diagnostic angiograms were performed the case was discussed with Dr. Roxy Manns CT surgery who felt like at his age with redo bypass surgery and shock he would not survive. We elected to attempt PCI of the left main. The right femoral sheath was upsized to a 7 [place]. Weight-based heparin was given for anticoagulation. Once a therapeutic ACT was achieved, a 7 [place] XBLAD guide catheter was inserted. A Whisper coronary guidewire was used to cross the left main into the LCx/first OM. Multiple attempts were made unsuccessfully to place a wire down the LAD. We attempted to cross with a Prowater, Whisper, Choice PT, Luge, and Miracle 3 wires without success. The LAD was acutely angulated from the left main and heavily calcified. The wire tended to select a tiny side branch. There was also the aneurysmal segment in the LAD. We elected to perform balloon angioplasty into the LCx and the distal Left main. The LCx was initially dilated with a 2.0 mm balloon. It was then dilated with a 2.5 mm angiosculpt balloon. The distal left main was dilated with a 3.0 mm noncompliant balloon. Given our inability to access the LAD I did not think that stenting was an option since stenting over the LAD would further compromise this vessel. Following PCI, there was 80% residual  stenosis in the left main with 50% residual stenosis in the LCx ostium. The LAD was unchanged. There appeared to be TIMI 3 flow. At this point the patient's rhythm stabilized with pacing. He continued to require IV pressors and IABP for hemodynamic support. The patient was transferred to the ICU for further monitoring and continued support as noted above.  PCI Data: Vessel - Left main/Segment - distal/ ostial LCx Percent Stenosis (pre) 90%/95% TIMI-flow 2 PTCA only Percent Stenosis (post) 80%/50% TIMI-flow (post) 3 Unable to access the LAD with a wire.   Final Conclusions:  1. Critical left main and ostial LAD/LCx disease in a left dominant circulation. Chronic total occlusion of the mid LCx 2. Occluded SVG to OM- chronic 3. Atretic LIMA to the LAD 4. Mild to moderate LV dysfunction. 5.  Cardiogenic shock requiring IABP and IV pressor support 6. Recurrent Vfib with CPR and multiple defibrillations. 7. Acute respiratory failure requiring intubation and ventilator support. 8. Complete heart block requiring temporary pacemaker placement.  9. Partially successful PTCA only of the distal left main and ostial LCx.   Recommendations: Transfer to ICU for continued hemodynamic support. He is not a candidate for further PCI. Prognosis is very guarded.   [name], [place]   I have independently reviewed the above cath films and reviewed the findings with the Patient's family- correction to the above, LAD was to cir , vein was to pda, no disease in lad at time of cabg in 1993 . In 1999 vein graft to pda was patent with collateral filling of cirx system, no lad disease .     Assessment / Plan:    Critical left main and ostial LAD/LCx disease in a left dominant circulation. Chronic total occlusion of the mid LCx- ptca 3 weeks ago of Left main but not LAD- now has improved in   rehab. With functional status improved have discussed with patient proceeding with high risk redo CABG when  plavix effect washed out - Patient agreeable.  Acute respiratory failure requiring intubation and ventilator support- resolved has not smoked for 3 weeks  COPD   Occluded SVG to PDA- may have been acute Atretic LIMA to the LCx occluded since 1999 moderate LV dysfunction.     Grace Isaac MD      [address] [place] [address] Office [phone]   Beeper [phone]  05/24/2014 3:14 PM

## 2014-05-24 NOTE — Progress Notes (Signed)
PROGRESS NOTE  Subjective:   Mr. Rocks is a 74 yo who I met several weeks ago when he was in the ICU. He has severe 3V CAD with ciritical LM, ostial LAD / LCX .  Chronic total occlusion of LCX, occluded SVG to OM, atretic LIMA to LAD. He was successfully weaned from the IABP and then the vent. He was discharged on 05/14/14 but readmitted 2/12 with chest pain and mild bump in Troponins  Heparin drip was stopped the last night because of 3 episodes of hematuria.   His chest pain resolved with NTG.    Objective:    Vital Signs:   Temp:  [98 F (36.7 C)-98.7 F (37.1 C)] 98 F (36.7 C) (02/15 0445) Pulse Rate:  [55-70] 70 (02/15 0445) Resp:  [18] 18 (02/15 0445) BP: (111-130)/(44-50) 130/45 mmHg (02/15 0445) SpO2:  [95 %-98 %] 98 % (02/15 0445)  Last BM Date: 05/22/14   24-hour weight change: Weight change:   Weight trends: Filed Weights   05/21/14 0630  Weight: 158 lb 11.7 oz (72 kg)    Intake/Output:  02/14 0701 - 02/15 0700 In: 1416 [P.O.:1416] Out: 150 [Urine:150] Total I/O In: 240 [P.O.:240] Out: -    Physical Exam: BP 130/45 mmHg  Pulse 70  Temp(Src) 98 F (36.7 C) (Oral)  Resp 18  Wt 158 lb 11.7 oz (72 kg)  SpO2 98%  Wt Readings from Last 3 Encounters:  05/21/14 158 lb 11.7 oz (72 kg)  05/13/14 153 lb 3.5 oz (69.5 kg)  04/14/14 172 lb (78.019 kg)    General: Vital signs reviewed and noted. Chronically ill appearing , elderly man. Looks much better than he did 2 weeks ago  Head: Normocephalic, atraumatic.  Eyes: conjunctivae/corneas clear.  EOM's intact.   Throat: normal  Neck:  normal   Lungs:    clear   Heart:  RR with frequent premature beats  Abdomen:  Soft, non-tender, non-distended    Extremities: No edema . Good pulses    Neurologic: A&O X3, CN II - XII are grossly intact.   Psych: Normal     Labs: BMET: No results for input(s): NA, K, CL, CO2, GLUCOSE, BUN, CREATININE, CALCIUM, MG, PHOS in the last 72 hours.  Liver  function tests: No results for input(s): AST, ALT, ALKPHOS, BILITOT, PROT, ALBUMIN in the last 72 hours. No results for input(s): LIPASE, AMYLASE in the last 72 hours.  CBC:  Recent Labs  05/23/14 0424 05/24/14 0525  WBC 4.1 4.2  HGB 11.4* 10.9*  HCT 32.9* 31.7*  MCV 95.9 94.6  PLT 263 216    Cardiac Enzymes:  Recent Labs  05/21/14 1155 05/21/14 1558 05/21/14 2232  TROPONINI 0.25* 0.25* 0.24*    Coagulation Studies: No results for input(s): LABPROT, INR in the last 72 hours.  Other: Invalid input(s): POCBNP No results for input(s): DDIMER in the last 72 hours. No results for input(s): HGBA1C in the last 72 hours. No results for input(s): CHOL, HDL, LDLCALC, TRIG, CHOLHDL in the last 72 hours. No results for input(s): TSH, T4TOTAL, T3FREE, THYROIDAB in the last 72 hours.  Invalid input(s): FREET3 No results for input(s): VITAMINB12, FOLATE, FERRITIN, TIBC, IRON, RETICCTPCT in the last 72 hours.   Other results:  tele  ( personally reviewed )  -  NSR with PACs     Medications:    Infusions: . heparin 900 Units/hr (05/23/14 1700)  . nitroGLYCERIN Stopped (05/21/14 0352)    Scheduled Medications: .  amiodarone  200 mg Oral TID  . aspirin EC  81 mg Oral Daily  . atorvastatin  40 mg Oral q1800  . carvedilol  3.125 mg Oral BID WC  . furosemide  20 mg Oral Daily  . loratadine  10 mg Oral Daily  . pantoprazole  40 mg Oral Daily    Assessment/ Plan:   Active Problems:   NSTEMI (non-ST elevated myocardial infarction)  1. CAD:  Has severe CAD, not amenable to PCI.  He has been seen by Dr Servando Snare several weeks but was not a candidate for re-do CABG at that time.  He came back with NSTEMI in the settings of a-fib/flutter with RVR, on amiodarone, currently in SR with very frequent PACs.  Heparin stopped the last night after 3 episodes of hematuria. Chest pain has resolved with NTG, currently asymptomatic even with walking.   We will await Dr. Everrett Coombe  decision. Hold Plavix for now.  2. Atrial flutter:  Is back in NSR with frequent PACs,  Continue amiodarone 200 TID for now   3. Hyperlipidemia: continue atorvastatin 40    Disposition:  Length of Stay: 3  Thayer Headings, Brooke Bonito., MD, [facility] 05/24/2014, 11:21 AM Office [phone] Pager [phone]

## 2014-05-24 NOTE — Progress Notes (Signed)
Medicare Important Message given? YES  (If response is "NO", the following Medicare IM given date fields will be blank)  Date Medicare IM given: 05/24/14 Medicare IM given by:  Lamine Laton  

## 2014-05-24 NOTE — Consult Note (Signed)
[address]       [place] [address]             [phone]        [name] [place] Medical Record #[id number] Date of Birth: February 14, 1941  Referring: [name] Primary Care: Nathan Kehr, MD  Chief Complaint:    Chief Complaint  Patient presents with  . Numbness    History of Present Illness:      [name] is a 74 yo white male well known to [facility].  He has an extensive cardiac history.  He is S/P CABG procedure originally done in 1993.  In January of this year the patient was admitted to [facility] with a NSTEMI, Atrial fibrillation with RVR.  At that time catheterization was performed and showed a critical left main, LAD, and CX lesion.  He has a left dominant system.  He developed cardiogenic shock requiring IABP and IV pressor support.  He also suffered recurrent V. Fib, with CPR and multiple defibrillations performed.  He require ventilator support for respiratory failure.  He underwent PTCA of the distal left main and ostial circumflex.  During that hospitalization [facility] consult was obtained for CABG consult, but patient was not felt to be a candidate at that time due to deconditioning.  It was recommended he get stronger prior to proceeding with surgical intervention.  He was discharged to a nursing home on 05/14/2014.  Patient initially did okay, however he again presented to [facility] with chest pain.  He was again found to have a mildly elevated troponin and to be in A.Flutter.  He was admitted, loaded with Plavix and started on a Heparin drip.  However, due to development of hematuria his heparin was discontinued.  UA was unremarkable and per patient he has been passing clots during urination.  This is new for him.  The patient admits to have episodes of chest burning with tingling and discomfort down his arms.  His last episode of this was yesterday and was relieved with NTG.  He unfortunately was given a dose of Plavix this  morning.  He is ambulating in the hallways without difficulty and per the patient he has been doing much better since hospital discharge earlier this month.    Current Activity/ Functional Status: Patient is independent with mobility/ambulation, transfers, ADL's, IADL's.   Zubrod Score: At the time of surgery this patient's most appropriate activity status/level should be described as: []     0    Normal activity, no symptoms []     1    Restricted in physical strenuous activity but ambulatory, able to do out light work [x]     2    Ambulatory and capable of self care, unable to do work activities, up and about                 more than 50%  Of the time                            []     3    Only limited self care, in bed greater than 50% of waking hours []     4    Completely disabled, no self care, confined to bed or chair []     5    Moribund  Past Medical History  Diagnosis Date  . CAD (coronary artery disease)     CABG x 2  with LIMA to Circumflex and vein graft to PDA  07/13/1991  . History of transient ischemic attack (TIA)   . Hyperlipidemia   . Peripheral neuropathy   . PVD (peripheral vascular disease) with claudication   . Carotid bruit     bilateral  . Ventral hernia   . Personal history of prostate cancer   . Diverticulosis   . ALLERGIC RHINITIS   . Osteoarthritis   . COPD (chronic obstructive pulmonary disease)   . Tobacco use disorder, continuous   . STEMI (ST elevation myocardial infarction) 04/29/2014    PTCA Lmain and CFX, in setting of VF/VT arrest and CGS    Past Surgical History  Procedure Laterality Date  . Coronary artery bypass graft  1992  . Lumbar laminectomy  [id number]     X2  . Prostatectomy  05/2008    Dr. Alinda [name] and XRT  . Appendectomy    . Tonsillectomy    . Left heart catheterization with coronary angiogram N/A 04/30/2014    Procedure: LEFT HEART CATHETERIZATION WITH CORONARY ANGIOGRAM;  Surgeon: Nathan M Martinique, MD; Lmain 90% (s/p PTCA), LAD 80%, CFX  100% (s/p PTCA), RCA OK, LIMA-LAD atretic, SVG-OM 100% (chronic), EF 45%, pt req defib x 13 for VF/VT arrest, CHB w/ tem pacer, IABP and intubation    History  Smoking status  . Current Every Day Smoker -- 40 years  . Types: Cigarettes  Smokeless tobacco  . Not on file    History  Alcohol Use No    History   Social History  . Marital Status: Married    Spouse Name: N/A  . Number of Children: N/A  . Years of Education: N/A   Occupational History  . Retired-self employed    Social History Main Topics  . Smoking status: Current Every Day Smoker -- 40 years    Types: Cigarettes  . Smokeless tobacco: Not on file  . Alcohol Use: No  . Drug Use: No  . Sexual Activity: Yes   Other Topics Concern  . Not on file   Social History Narrative    Allergies  Allergen Reactions  . Codeine Sulfate Itching    Current Facility-Administered Medications  Medication Dose Route Frequency Provider Last Rate Last Dose  . acetaminophen (TYLENOL) tablet 650 mg  650 mg Oral Q4H PRN Nathan Amis, MD   650 mg at 05/24/14 0238  . amiodarone (PACERONE) tablet 200 mg  200 mg Oral TID Nathan Hector, MD   200 mg at 05/23/14 2127  . aspirin EC tablet 81 mg  81 mg Oral Daily Nathan Amis, MD   81 mg at 05/23/14 1124  . atorvastatin (LIPITOR) tablet 40 mg  40 mg Oral q1800 Nathan Amis, MD   40 mg at 05/23/14 1658  . carvedilol (COREG) tablet 3.125 mg  3.125 mg Oral BID WC Nathan Amis, MD   3.125 mg at 05/24/14 0850  . clopidogrel (PLAVIX) tablet 75 mg  75 mg Oral Q breakfast Nathan Amis, MD   75 mg at 05/24/14 0850  . docusate sodium (COLACE) capsule 100 mg  100 mg Oral Daily PRN Nathan N Dunn, PA-C      . fluticasone (FLONASE) 50 MCG/ACT nasal spray 2 spray  2 spray Each Nare Daily PRN Nathan N Dunn, PA-C   2 spray at 05/23/14 1758  . furosemide (LASIX) tablet 20 mg  20 mg Oral Daily Nathan Amis, MD   20 mg at 05/23/14 1125  .  heparin ADULT infusion 100 units/mL (25000 units/250  mL)  900 Units/hr Intravenous Continuous [name], [name] 9 mL/hr at 05/23/14 1700 900 Units/hr at 05/23/14 1700  . loratadine (CLARITIN) tablet 10 mg  10 mg Oral Daily Nathan Amis, MD   10 mg at 05/23/14 1124  . nitroGLYCERIN 50 mg in dextrose 5 % 250 mL (0.2 mg/mL) infusion  10-200 mcg/min Intravenous Titrated Everlene Balls, MD   Stopped at 05/21/14 0352  . ondansetron (ZOFRAN) injection 4 mg  4 mg Intravenous Q6H PRN Nathan Amis, MD   4 mg at 05/23/14 9798  . pantoprazole (PROTONIX) EC tablet 40 mg  40 mg Oral Daily Nathan Amis, MD   40 mg at 05/23/14 0755  . sodium chloride (OCEAN) 0.65 % nasal spray 1 spray  1 spray Each Nare PRN Nathan N Dunn, PA-C   1 spray at 05/23/14 1757  . traZODone (DESYREL) tablet 50 mg  50 mg Oral QHS PRN Nathan Amis, MD        Prescriptions prior to admission  Medication Sig Dispense Refill Last Dose  . acetaminophen (TYLENOL) 325 MG tablet Take 975 mg by mouth 3 (three) times daily as needed (pain).   unknown  . amiodarone (PACERONE) 200 MG tablet Take 1.5 tablets (300 mg total) by mouth 2 (two) times daily. 90 tablet 11 05/20/2014 at Unknown time  . apixaban (ELIQUIS) 5 MG TABS tablet Take 1 tablet (5 mg total) by mouth 2 (two) times daily. 60 tablet 0 05/20/2014 at Unknown time  . atorvastatin (LIPITOR) 40 MG tablet Place 1 tablet (40 mg total) into feeding tube daily at 6 PM. 30 tablet 11 05/20/2014 at Unknown time  . bisacodyl (DULCOLAX) 10 MG suppository Place 1 suppository (10 mg total) rectally daily as needed for moderate constipation. 12 suppository 0 unknown  . carvedilol (COREG) 3.125 MG tablet Take 1 tablet (3.125 mg total) by mouth 2 (two) times daily with a meal. 60 tablet 11 05/20/2014 at 1700  . clopidogrel (PLAVIX) 75 MG tablet Take 1 tablet (75 mg total) by mouth daily. 30 tablet 11 05/20/2014 at Unknown time  . feeding supplement, ENSURE COMPLETE, (ENSURE COMPLETE) LIQD Take 237 mLs by mouth 2 (two) times daily between meals.    05/20/2014 at Unknown time  . fluticasone (FLONASE) 50 MCG/ACT nasal spray Place 1 spray into both nostrils 2 (two) times daily.   05/20/2014 at Unknown time  . furosemide (LASIX) 20 MG tablet Take 1 tablet (20 mg total) by mouth daily. 30 tablet 11 05/20/2014 at Unknown time  . loratadine (CLARITIN) 10 MG tablet Take 10 mg by mouth daily as needed for allergies.    unknown  . magnesium hydroxide (MILK OF MAGNESIA) 400 MG/5ML suspension Take 30 mLs by mouth daily as needed for mild constipation.   unknown  . nitroGLYCERIN (NITROSTAT) 0.4 MG SL tablet Place 0.4 mg under the tongue every 5 (five) minutes as needed for chest pain.   unknown  . omeprazole (PRILOSEC) 20 MG capsule Take 20 mg by mouth daily.     05/20/2014 at Unknown time  . phenol (CHLORASEPTIC) 1.4 % LIQD Use as directed 1 spray in the mouth or throat 3 (three) times daily as needed for throat irritation / pain.   unknown  . polyethylene glycol (MIRALAX / GLYCOLAX) packet Place 17 g into feeding tube 2 (two) times daily. 14 each 0 05/20/2014 at Unknown time  . ranolazine (RANEXA) 500 MG 12 hr tablet Take 1  tablet (500 mg total) by mouth 2 (two) times daily. 60 tablet 11 05/20/2014 at Unknown time  . sodium fluoride (FLUORISHIELD) 1.1 % GEL dental gel Place 1 application onto teeth 2 (two) times daily as needed (to clean teeth).   unknown at Unknown time  . sodium phosphate (FLEET) enema Place 1 enema rectally as needed (for constipation). follow package directions   unknown  . traMADol (ULTRAM) 50 MG tablet Take 1-2 tablets (50-100 mg total) by mouth 2 (two) times daily as needed (pain). (Patient taking differently: Take 50 mg by mouth 2 (two) times daily as needed (pain). ) 30 tablet 0 unknown  . traZODone (DESYREL) 50 MG tablet Take 1 tablet (50 mg total) by mouth at bedtime. 30 tablet 0 05/20/2014 at Unknown time  . clobetasol cream (TEMOVATE) 8.09 % Apply 1 application topically 3 (three) times daily as needed (rash). Do not use on face or  genitals   Not Taking at Unknown time  . clobetasol ointment (TEMOVATE) 9.83 % Apply 1 application topically 2 (two) times daily. (Patient not taking: Reported on 04/30/2014) 60 g 3 Not Taking at Unknown time  . PRESCRIPTION MEDICATION Apply 1 application topically 2 (two) times daily. Sodium fluoride 1% gel - apply thin ribbon to teeth with toothbrush   Not Taking at Unknown time  . triamcinolone ointment (KENALOG) 0.5 % Apply 1 application topically 3 (three) times daily. (Patient not taking: Reported on 05/20/2014) 30 g 1 Not Taking at Unknown time    Family History  Problem Relation Age of Onset  . Hypertension    . Cancer Mother     Colon  . Heart disease Father   . Coronary artery disease Father   . Cancer Sister   . Heart disease Brother      Review of Systems:  Pertinent items are noted in HPI.     Cardiac Review of Systems: Y or N  Chest Pain [ y   ]  Resting SOB [ n  ] Exertional SOB  [  ]  Orthopnea [  ]   Pedal Edema [   ]    Palpitations Blue.Reese  ] Syncope  [  ]   Presyncope [   ]  General Review of Systems: [Y] = yes [  ]=no Constitional: recent weight change [  ]; anorexia [  ]; fatigue [  ]; nausea [n  ]; night sweats [  ]; fever [  ]; or chills [  ]                                                               Dental: poor dentition[  ]; Last Dentist visit:   Eye : blurred vision [  ]; diplopia [   ]; vision changes [  ];  Amaurosis fugax[  ]; Resp: cough [  ];  wheezing[  ];  hemoptysis[  ]; shortness of breath[  ]; paroxysmal nocturnal dyspnea[  ]; dyspnea on exertion[  ]; or orthopnea[  ];  GI:  gallstones[  ], vomiting[  ];  dysphagia[  ]; melena[  ];  hematochezia [  ]; heartburn[  ];   Hx of  Colonoscopy[  ]; GU: kidney stones [  ]; hematuria[ y ];   dysuria [  ];  nocturia[  ];  history of     obstruction [  ]; urinary frequency [  ]             Skin: rash, swelling[  ];, hair loss[  ];  peripheral edema[  ];  or itching[  ]; Musculosketetal: myalgias[ y ];  joint  swelling[  ];  joint erythema[  ];  joint pain[  ];  back pain[  ];  Heme/Lymph: bruising[  ];  bleeding[  ];  anemia[  ];  Neuro: TIA[  ];  headaches[  ];  stroke[  ];  vertigo[  ];  seizures[  ];   paresthesias[  ];  difficulty walking[  ];  Psych:depression[  ]; anxiety[  ];  Endocrine: diabetes[  ];  thyroid dysfunction[  ];  Immunizations: Flu [  ]; Pneumococcal[  ];  Other:  Physical Exam: BP 130/45 mmHg  Pulse 70  Temp(Src) 98 F (36.7 C) (Oral)  Resp 18  Wt 158 lb 11.7 oz (72 kg)  SpO2 98%  General appearance: alert, cooperative and no distress Head: Normocephalic, without obvious abnormality, atraumatic Resp: clear to auscultation bilaterally Cardio: irregularly irregular rhythm GI: soft, non-tender; bowel sounds normal; no masses,  no organomegaly Extremities: extremities normal, atraumatic, no cyanosis or edema and previous removal of saphenous vein from right leg Neurologic: Grossly normal  Diagnostic Studies & Laboratory data:     Recent Radiology Findings:   No results found.   I have independently reviewed the above radiologic studies.  Recent Lab Findings: Lab Results  Component Value Date   WBC 4.2 05/24/2014   HGB 10.9* 05/24/2014   HCT 31.7* 05/24/2014   PLT 216 05/24/2014   GLUCOSE 115* 05/20/2014   CHOL 146 11/19/2012   TRIG 93.0 11/19/2012   HDL 38.50* 11/19/2012   LDLCALC 89 11/19/2012   ALT 16 11/19/2012   AST 17 11/19/2012   NA 137 05/20/2014   K 3.7 05/20/2014   CL 101 05/20/2014   CREATININE 1.18 05/20/2014   BUN 13 05/20/2014   CO2 26 05/20/2014   TSH 1.14 04/12/2011   INR 1.01 04/29/2014   HGBA1C 5.7 07/11/2006      Assessment / Plan:     1. Severe 3V CAD with ongoing angina, S/P CABG in 1993- needs REDO CABG- however patient was loaded with Plavix on admission and last dose of Plavix received this morning- will get P2Y12 assay 2. A. Flutter- on Amiodarone 3. Hematuria- off heparin, clots per patient continue to monitor 4.  Deconditioning- improved, patient able to ambulate with minimal difficulty 5. Dispo- Dr. Servando Snare aware of patient, will evaluate later today        @ME1 @ 05/24/2014 9:00 AM

## 2014-05-25 ENCOUNTER — Inpatient Hospital Stay (HOSPITAL_COMMUNITY): Payer: Medicare Other

## 2014-05-25 DIAGNOSIS — E43 Unspecified severe protein-calorie malnutrition: Secondary | ICD-10-CM | POA: Insufficient documentation

## 2014-05-25 DIAGNOSIS — Z951 Presence of aortocoronary bypass graft: Secondary | ICD-10-CM

## 2014-05-25 DIAGNOSIS — I480 Paroxysmal atrial fibrillation: Secondary | ICD-10-CM

## 2014-05-25 DIAGNOSIS — Z7901 Long term (current) use of anticoagulants: Secondary | ICD-10-CM

## 2014-05-25 DIAGNOSIS — Z01810 Encounter for preprocedural cardiovascular examination: Secondary | ICD-10-CM

## 2014-05-25 DIAGNOSIS — I2510 Atherosclerotic heart disease of native coronary artery without angina pectoris: Secondary | ICD-10-CM

## 2014-05-25 DIAGNOSIS — G609 Hereditary and idiopathic neuropathy, unspecified: Secondary | ICD-10-CM

## 2014-05-25 DIAGNOSIS — Z9861 Coronary angioplasty status: Secondary | ICD-10-CM

## 2014-05-25 DIAGNOSIS — I255 Ischemic cardiomyopathy: Secondary | ICD-10-CM | POA: Diagnosis present

## 2014-05-25 LAB — PULMONARY FUNCTION TEST
DL/VA % pred: 76 %
DL/VA: 3.5 ml/min/mmHg/L
DLCO cor % pred: 58 %
DLCO cor: 18.9 ml/min/mmHg
DLCO unc % pred: 50 %
DLCO unc: 16.36 ml/min/mmHg
FEF 25-75 Post: 1.84 L/sec
FEF 25-75 Pre: 1.43 L/sec
FEF2575-%Change-Post: 29 %
FEF2575-%Pred-Post: 83 %
FEF2575-%Pred-Pre: 64 %
FEV1-%Change-Post: 5 %
FEV1-%Pred-Post: 86 %
FEV1-%Pred-Pre: 81 %
FEV1-Post: 2.62 L
FEV1-Pre: 2.49 L
FEV1FVC-%Change-Post: 7 %
FEV1FVC-%Pred-Pre: 95 %
FEV6-%Change-Post: -2 %
FEV6-%Pred-Post: 87 %
FEV6-%Pred-Pre: 89 %
FEV6-Post: 3.48 L
FEV6-Pre: 3.56 L
FEV6FVC-%Change-Post: 0 %
FEV6FVC-%Pred-Post: 104 %
FEV6FVC-%Pred-Pre: 104 %
FVC-%Change-Post: -2 %
FVC-%Pred-Post: 84 %
FVC-%Pred-Pre: 86 %
FVC-Post: 3.56 L
FVC-Pre: 3.64 L
Post FEV1/FVC ratio: 74 %
Post FEV6/FVC ratio: 98 %
Pre FEV1/FVC ratio: 68 %
Pre FEV6/FVC Ratio: 98 %
RV % pred: 18 %
RV: 0.48 L
TLC % pred: 59 %
TLC: 4.16 L

## 2014-05-25 LAB — CBC
HCT: 30.5 % — ABNORMAL LOW (ref 39.0–52.0)
Hemoglobin: 10.6 g/dL — ABNORMAL LOW (ref 13.0–17.0)
MCH: 32.9 pg (ref 26.0–34.0)
MCHC: 34.8 g/dL (ref 30.0–36.0)
MCV: 94.7 fL (ref 78.0–100.0)
Platelets: 211 10*3/uL (ref 150–400)
RBC: 3.22 MIL/uL — ABNORMAL LOW (ref 4.22–5.81)
RDW: 13.5 % (ref 11.5–15.5)
WBC: 4 10*3/uL (ref 4.0–10.5)

## 2014-05-25 LAB — APTT: APTT: 28 s (ref 24–37)

## 2014-05-25 LAB — HEPARIN LEVEL (UNFRACTIONATED)
Heparin Unfractionated: <0.1 IU/mL — ABNORMAL LOW (ref 0.30–0.70)
Heparin Unfractionated: 0.29 IU/mL — ABNORMAL LOW (ref 0.30–0.70)

## 2014-05-25 MED ORDER — ALBUTEROL SULFATE (2.5 MG/3ML) 0.083% IN NEBU
2.5000 mg | INHALATION_SOLUTION | Freq: Once | RESPIRATORY_TRACT | Status: AC
  Administered 2014-05-25: 2.5 mg via RESPIRATORY_TRACT

## 2014-05-25 MED ORDER — HEPARIN (PORCINE) IN NACL 100-0.45 UNIT/ML-% IJ SOLN
900.0000 [IU]/h | INTRAMUSCULAR | Status: DC
  Administered 2014-05-25 – 2014-05-27 (×3): 900 [IU]/h via INTRAVENOUS
  Filled 2014-05-25 (×5): qty 250

## 2014-05-25 MED ORDER — TRAMADOL HCL 50 MG PO TABS
50.0000 mg | ORAL_TABLET | Freq: Four times a day (QID) | ORAL | Status: DC | PRN
  Administered 2014-05-25 – 2014-05-27 (×6): 50 mg via ORAL
  Filled 2014-05-25 (×7): qty 1

## 2014-05-25 MED ORDER — ENSURE COMPLETE PO LIQD
237.0000 mL | Freq: Two times a day (BID) | ORAL | Status: DC
  Administered 2014-05-25 – 2014-05-27 (×5): 237 mL via ORAL

## 2014-05-25 NOTE — Progress Notes (Signed)
    Subjective:  No chest or arm pain  Objective:  Vital Signs in the last 24 hours: Temp:  [97.9 F (36.6 C)-98.3 F (36.8 C)] 98.3 F (36.8 C) (02/16 0445) Pulse Rate:  [60-67] 67 (02/16 0445) Resp:  [18-19] 19 (02/16 0445) BP: (103-116)/(47-53) 116/50 mmHg (02/16 0445) SpO2:  [95 %-99 %] 95 % (02/16 0445)  Intake/Output from previous day:  Intake/Output Summary (Last 24 hours) at 05/25/14 1021 Last data filed at 05/25/14 0800  Gross per 24 hour  Intake    720 ml  Output   1400 ml  Net   -680 ml    Physical Exam: General appearance: alert, cooperative, no distress and pale Lungs: clear to auscultation bilaterally Heart: regular rate and rhythm   Rate: 68  Rhythm: normal sinus rhythm and PAF earlier on telemetry  Lab Results:  Recent Labs  05/24/14 0525 05/25/14 0418  WBC 4.2 4.0  HGB 10.9* 10.6*  PLT 216 211   No results for input(s): NA, K, CL, CO2, GLUCOSE, BUN, CREATININE in the last 72 hours. No results for input(s): TROPONINI in the last 72 hours.  Invalid input(s): CK, MB No results for input(s): INR in the last 72 hours.  Imaging: Imaging results have been reviewed  Cardiac Studies:  Assessment/Plan:  [name] with history of remote CABG in 1993 presented with Acute inferior STEMI 05/04/14 associated with cardiogenic shock, and complete heart block. He underwent urgent PCI to LM and CFX. He was not felt to be a candidate for re do CABG then. He was discharge 05/14/14 and re admitted 05/20/14 with arm pain and Troponin bump (0.25). He has been re evaluated by Dr Servando Snare and is on the schedule for re do CABG Friday (Plavix washout).   Principal Problem:   NSTEMI (non-ST elevated myocardial infarction) Active Problems:   STEMI 05/04/14 with shock, CPR, CHB, VT   CAD S/P LM and CFX PCI 05/04/14   S/P CABG x 2   PAF- on Amiodarone   Chronic anticoagulation-on Eliquis at discharge 05/11/14   ICM-EF 45-50% by echo 04/30/14   Hyperlipidemia   Hereditary  and idiopathic peripheral neuropathy   PLAN: Heparin stopped secondary to hematuria. Amiodarone increased for PAF. Re do CABG Friday.   [name] PA-C Beeper [phone] 05/25/2014, 10:21 AM   The patient was seen, examined and discussed with [name], PA-C and I agree with the above.   Active Problems:  NSTEMI (non-ST elevated myocardial infarction)  1. CAD: Has severe CAD, not amenable to PCI. He has been seen by Dr Servando Snare several weeks but was not a candidate for re-do CABG at that time. He came back with NSTEMI in the settings of a-fib/flutter with RVR, on amiodarone, currently in SR with very frequent PACs.  Heparin stopped yesterday after 3 episodes of hematuria. Chest pain has resolved with NTG, currently asymptomatic even with walking.  Dr Servando Snare is willing to operate on him this Friday if we are able to restart Heparin. He hasn't had any hematuria since yesterday. We will restart, we will also call urology consult. Off Plavix.   2. Atrial flutter: Is back in NSR with frequent PACs, Continue amiodarone 200 TID for now   3. Hyperlipidemia: continue atorvastatin 40 mg po daily  [name] 05/25/2014

## 2014-05-25 NOTE — Care Management Note (Signed)
    Page 1 of 2   06/04/2014     4:46:19 PM CARE MANAGEMENT NOTE 06/04/2014  Patient:  [name], [name]   Account Number:  [id number]  Date Initiated:  05/24/2014  Documentation initiated by:  Marvetta Gibbons  Subjective/Objective Assessment:   Pt admitted with NSTEMI     Action/Plan:   PTA pt was at [facility] for rehab- prior to that lived at home with wife- per pt his plans are to return home with wife   Anticipated DC Date:  06/04/2014   Anticipated DC Plan:  SKILLED NURSING FACILITY  In-house referral  Clinical Social Worker      DC Planning Services  CM consult      Choice offered to / List presented to:             Status of service:  Completed, signed off Medicare Important Message given?  YES (If response is "NO", the following Medicare IM given date fields will be blank) Date Medicare IM given:  05/24/2014 Medicare IM given by:  Marvetta Gibbons Date Additional Medicare IM given:  06/04/2014 Additional Medicare IM given by:  Marvetta Gibbons  Discharge Disposition:  [place]  Per UR Regulation:  Reviewed for med. necessity/level of care/duration of stay  If discussed at [place] of Stay Meetings, dates discussed:   05/25/2014  05/27/2014  06/01/2014  06/03/2014    Comments:  06/04/14- 1200- Marvetta Gibbons RN, BSN [phone] Per PT eval recommendation for SNF- pt agreeable- CSW to follow up for placement needs.  05/28/14- 1600- Marvetta Gibbons RN, BSN [phone] S/p redo CABG x4 today, ICU on vent post op- will f/u progression for d/c needs  05/25/14- 1000 - Marvetta Gibbons RN, BSN [phone] Plan for possible redo CABG on friday- having plavix wash out now- pt may need STSNF again post op- will follow progression.

## 2014-05-25 NOTE — Progress Notes (Signed)
ANTICOAGULATION CONSULT NOTE - Follow Up Consult  Pharmacy Consult for Resume heparin Indication: CAD, afib  Allergies  Allergen Reactions  . Codeine Sulfate Itching    Patient Measurements: Weight: 158 lb 11.7 oz (72 kg) Heparin Dosing Weight: 72 kg  Vital Signs: Temp: 98.3 F (36.8 C) (02/16 0445) Temp Source: Oral (02/16 0445) BP: 116/50 mmHg (02/16 0445) Pulse Rate: 67 (02/16 0445)  Labs:  Recent Labs  05/23/14 0424 05/23/14 1514 05/24/14 0525 05/25/14 0418  HGB 11.4*  --  10.9* 10.6*  HCT 32.9*  --  31.7* 30.5*  PLT 263  --  216 211  APTT 116* 90* 28 28  HEPARINUNFRC 1.00*  --  <0.10* <0.10*    Estimated Creatinine Clearance: 55.1 mL/min (by C-G formula based on Cr of 1.18).   Assessment: Patient is a 74 y.o M with CAD and Afib now off plavix in anticipation of CABG.  Home Eliquis was transitioned over to heparin but drip was d/ced on 2/14 d/t hematuria.  No bleeding noted today -- To resume heparin back to today per cardiology.  Goal of Therapy:  Heparin level 0.3-0.7 units/ml Monitor platelets by anticoagulation protocol: Yes   Plan:  - resume heparin back at 900 units/hr (will not bolus d/t recent bleeding events) - check 6 hour heparin level - monitor for s/s of bleeding - f/u recommendations from urology  Devin Going, [employer] P 05/25/2014,12:49 PM

## 2014-05-25 NOTE — Progress Notes (Signed)
VASCULAR LAB PRELIMINARY  PRELIMINARY  PRELIMINARY  PRELIMINARY  Pre-op Cardiac Surgery  Carotid Findings:  Bilateral:  1-39% ICA stenosis.  Vertebral artery flow is antegrade.     Upper Extremity Right Left  Brachial Pressures 127 Triphasic 123 Triphasic  Radial Waveforms Triphasic Triphasic  Ulnar Waveforms Triphasic Triphasic  Palmar Arch (Allen's Test) Abnormal Abnormal   Findings:  Right - Doppler waveforms obliterate with radial compressions and remain normal with ulnar compressions. Left - Doppler waveforms remained normal with radial compressions and diminished 50% with ulnar compressions.    Lower  Extremity Right Left  Dorsalis Pedis    Anterior Tibial    Posterior Tibial    Ankle/Brachial Indices      Findings:  Palpable pedal pulses bilaterally at rest.   [name], RVS 05/25/2014, 9:57 AM

## 2014-05-25 NOTE — Progress Notes (Addendum)
CARDIAC REHAB PHASE I   PRE:  Rate/Rhythm: 65 SR  BP:  Supine:   Sitting: 112/37  Standing:    SaO2: 98 RA  MODE:  Ambulation: 420 ft   POST:  Rate/Rhythm: 68 SR  BP:  Supine:   Sitting: 125/51  Standing:    SaO2: 99 RA 1125-1200 Pt tolerated ambulation well without c/o of pain or SOB.He used his cane to walk, steady gait. VS stable Pt to bed after walk with call light in reach. I gave pt heart surgery education booklet and pt care guide. I encouraged him to watch heart surgery video. We will continue to follow pt.  Rodney Langton RN 05/25/2014 11:55 AM

## 2014-05-25 NOTE — Consult Note (Signed)
Urology Consult  Referring physician:   Dr. Meda Coffee Reason for referral:   Gross hematuria  Chief Complaint:   Gross hematuria. Prostate Cancer  History of Present Illness:  [name] is a 74 yo male 6 years post Laparoscopic Robotic Radical Prostatectomy for Carcinoma of the Prostate per Dr. Alinda Money. He remains cancer-free, with undetectable PSA. He has been voiding well until anticoagulation following foley catheterization, with resultant gross painless hematuria x 3 yesterday. Foley has been removed, and pt's urine has cleared today.     No hx of stone disease, or cystitis. No dysuria, no fever.   Past Medical History  Diagnosis Date  . CAD (coronary artery disease)     CABG x 2 with LIMA to Circumflex and vein graft to PDA  07/13/1991  . History of transient ischemic attack (TIA)   . Hyperlipidemia   . Peripheral neuropathy   . PVD (peripheral vascular disease) with claudication   . Carotid bruit     bilateral  . Ventral hernia   . Personal history of prostate cancer   . Diverticulosis   . ALLERGIC RHINITIS   . Osteoarthritis   . COPD (chronic obstructive pulmonary disease)   . Tobacco use disorder, continuous   . STEMI (ST elevation myocardial infarction) 04/29/2014    PTCA Lmain and CFX, in setting of VF/VT arrest and CGS   Past Surgical History  Procedure Laterality Date  . Coronary artery bypass graft  1992  . Lumbar laminectomy  [id number]     X2  . Prostatectomy  05/2008    Dr. Alinda Money and XRT  . Appendectomy    . Tonsillectomy    . Left heart catheterization with coronary angiogram N/A 04/30/2014    Procedure: LEFT HEART CATHETERIZATION WITH CORONARY ANGIOGRAM;  Surgeon: Peter M Martinique, MD; Lmain 90% (s/p PTCA), LAD 80%, CFX 100% (s/p PTCA), RCA OK, LIMA-LAD atretic, SVG-OM 100% (chronic), EF 45%, pt req defib x 13 for VF/VT arrest, CHB w/ tem pacer, IABP and intubation    Medications: I have reviewed the patient's current medications. Allergies:  Allergies   Allergen Reactions  . Codeine Sulfate Itching    Family History  Problem Relation Age of Onset  . Hypertension    . Cancer Mother     Colon  . Heart disease Father   . Coronary artery disease Father   . Cancer Sister   . Heart disease Brother    Social History:  reports that he has been smoking Cigarettes.  He has smoked for the past 40 years. He does not have any smokeless tobacco history on file. He reports that he does not drink alcohol or use illicit drugs.  ROS: All systems are reviewed and negative except as noted. No flank pain. No urethral pain.   Physical Exam:  Vital signs in last 24 hours: Temp:  [97.9 F (36.6 C)-98.3 F (36.8 C)] 98.1 F (36.7 C) (02/16 2034) Pulse Rate:  [62-67] 62 (02/16 2034) Resp:  [18-19] 18 (02/16 2034) BP: (112-118)/(50-66) 118/56 mmHg (02/16 2034) SpO2:  [95 %-96 %] 96 % (02/16 2034)  Cardiovascular: Skin warm; not flushed Respiratory: Breaths quiet; no shortness of breath Abdomen: No masses Neurological: Normal sensation to touch Musculoskeletal: Normal motor function arms and legs Lymphatics: No inguinal adenopathy Skin: No rashes Genitourinary: Normal penis. Normal scrotum. Testes descended bilaterally.   Laboratory Data:  Results for orders placed or performed during the hospital encounter of 05/20/14 (from the past 72 hour(s))  Heparin level (unfractionated)  Status: Abnormal   Collection Time: 05/23/14  4:24 AM  Result Value Ref Range   Heparin Unfractionated 1.00 (H) 0.30 - 0.70 IU/mL    Comment: RESULTS CONFIRMED BY MANUAL DILUTION        IF HEPARIN RESULTS ARE BELOW EXPECTED VALUES, AND PATIENT DOSAGE HAS BEEN CONFIRMED, SUGGEST FOLLOW UP TESTING OF ANTITHROMBIN III LEVELS.   CBC     Status: Abnormal   Collection Time: 05/23/14  4:24 AM  Result Value Ref Range   WBC 4.1 4.0 - 10.5 K/uL   RBC 3.43 (L) 4.22 - 5.81 MIL/uL   Hemoglobin 11.4 (L) 13.0 - 17.0 g/dL   HCT 32.9 (L) 39.0 - 52.0 %   MCV 95.9 78.0 -  100.0 fL   MCH 33.2 26.0 - 34.0 pg   MCHC 34.7 30.0 - 36.0 g/dL   RDW 13.5 11.5 - 15.5 %   Platelets 263 150 - 400 K/uL  APTT     Status: Abnormal   Collection Time: 05/23/14  4:24 AM  Result Value Ref Range   aPTT 116 (H) 24 - 37 seconds    Comment:        IF BASELINE aPTT IS ELEVATED, SUGGEST PATIENT RISK ASSESSMENT BE USED TO DETERMINE APPROPRIATE ANTICOAGULANT THERAPY.   Occult blood card to lab, stool RN will collect     Status: None   Collection Time: 05/23/14  9:36 AM  Result Value Ref Range   Fecal Occult Bld NEGATIVE NEGATIVE  APTT     Status: Abnormal   Collection Time: 05/23/14  3:14 PM  Result Value Ref Range   aPTT 90 (H) 24 - 37 seconds    Comment:        IF BASELINE aPTT IS ELEVATED, SUGGEST PATIENT RISK ASSESSMENT BE USED TO DETERMINE APPROPRIATE ANTICOAGULANT THERAPY.   Urinalysis, Routine w reflex microscopic     Status: Abnormal   Collection Time: 05/23/14  9:30 PM  Result Value Ref Range   Color, Urine RED (A) YELLOW    Comment: BIOCHEMICALS MAY BE AFFECTED BY COLOR   APPearance CLOUDY (A) CLEAR   Specific Gravity, Urine 1.010 1.005 - 1.030   pH 6.0 5.0 - 8.0   Glucose, UA NEGATIVE NEGATIVE mg/dL   Hgb urine dipstick LARGE (A) NEGATIVE   Bilirubin Urine NEGATIVE NEGATIVE   Ketones, ur 15 (A) NEGATIVE mg/dL   Protein, ur 100 (A) NEGATIVE mg/dL   Urobilinogen, UA 1.0 0.0 - 1.0 mg/dL   Nitrite NEGATIVE NEGATIVE   Leukocytes, UA SMALL (A) NEGATIVE  Urine microscopic-add on     Status: Abnormal   Collection Time: 05/23/14  9:30 PM  Result Value Ref Range   WBC, UA 3-6 <3 WBC/hpf   RBC / HPF TOO NUMEROUS TO COUNT <3 RBC/hpf   Bacteria, UA FEW (A) RARE  Heparin level (unfractionated)     Status: Abnormal   Collection Time: 05/24/14  5:25 AM  Result Value Ref Range   Heparin Unfractionated <0.10 (L) 0.30 - 0.70 IU/mL    Comment: REPEATED TO VERIFY        IF HEPARIN RESULTS ARE BELOW EXPECTED VALUES, AND PATIENT DOSAGE HAS BEEN CONFIRMED, SUGGEST  FOLLOW UP TESTING OF ANTITHROMBIN III LEVELS.   CBC     Status: Abnormal   Collection Time: 05/24/14  5:25 AM  Result Value Ref Range   WBC 4.2 4.0 - 10.5 K/uL   RBC 3.35 (L) 4.22 - 5.81 MIL/uL   Hemoglobin 10.9 (L) 13.0 - 17.0 g/dL  HCT 31.7 (L) 39.0 - 52.0 %   MCV 94.6 78.0 - 100.0 fL   MCH 32.5 26.0 - 34.0 pg   MCHC 34.4 30.0 - 36.0 g/dL   RDW 13.6 11.5 - 15.5 %   Platelets 216 150 - 400 K/uL  APTT     Status: None   Collection Time: 05/24/14  5:25 AM  Result Value Ref Range   aPTT 28 24 - 37 seconds  Platelet inhibition p2y12     Status: Abnormal   Collection Time: 05/24/14 10:28 AM  Result Value Ref Range   Platelet Function  P2Y12 123 (L) 194 - 418 PRU    Comment:        The literature has shown a direct correlation of PRU values over 230 with higher risks of thrombotic events.  Lower PRU values are associated with platelet inhibition.   Heparin level (unfractionated)     Status: Abnormal   Collection Time: 05/25/14  4:18 AM  Result Value Ref Range   Heparin Unfractionated <0.10 (L) 0.30 - 0.70 IU/mL    Comment:        IF HEPARIN RESULTS ARE BELOW EXPECTED VALUES, AND PATIENT DOSAGE HAS BEEN CONFIRMED, SUGGEST FOLLOW UP TESTING OF ANTITHROMBIN III LEVELS.   CBC     Status: Abnormal   Collection Time: 05/25/14  4:18 AM  Result Value Ref Range   WBC 4.0 4.0 - 10.5 K/uL   RBC 3.22 (L) 4.22 - 5.81 MIL/uL   Hemoglobin 10.6 (L) 13.0 - 17.0 g/dL   HCT 30.5 (L) 39.0 - 52.0 %   MCV 94.7 78.0 - 100.0 fL   MCH 32.9 26.0 - 34.0 pg   MCHC 34.8 30.0 - 36.0 g/dL   RDW 13.5 11.5 - 15.5 %   Platelets 211 150 - 400 K/uL  APTT     Status: None   Collection Time: 05/25/14  4:18 AM  Result Value Ref Range   aPTT 28 24 - 37 seconds  Heparin level (unfractionated)     Status: Abnormal   Collection Time: 05/25/14  6:30 PM  Result Value Ref Range   Heparin Unfractionated 0.29 (L) 0.30 - 0.70 IU/mL    Comment:        IF HEPARIN RESULTS ARE BELOW EXPECTED VALUES, AND  PATIENT DOSAGE HAS BEEN CONFIRMED, SUGGEST FOLLOW UP TESTING OF ANTITHROMBIN III LEVELS.    No results found for this or any previous visit (from the past 240 hour(s)). Creatinine:  Recent Labs  05/20/14 2353  CREATININE 1.18    Xrays: CT 2011 shiowed no stones or lymph nodes.  Impression/Assessment:   Recent gross hematuria, now gone after foley removed. No UTI.  Note exaggerated PTT of 90, now corrected. CaP post LRRP, with NED.  Will ck sCT scan. Pt voiding well now. Should be ok for CVTS surgery for Friday. Will refer to Dr. Alinda Money in AM.   Plan:  CT hematuria protocol Refer to Dr,. Borden in AM.  [name], [name] I 05/25/2014, 10:27 PM

## 2014-05-25 NOTE — Progress Notes (Signed)
Patient ID: [name], male   DOB: 1940-06-25, 74 y.o.   MRN: [id number]      [address]       [place] [address]             [phone]                   Procedure(s) (LRB): REDO CORONARY ARTERY BYPASS GRAFTING (CABG) (N/A) TRANSESOPHAGEAL ECHOCARDIOGRAM (TEE) (N/A)  LOS: 4 days   Subjective: No chest pain  Objective: Vital signs in last 24 hours: Patient Vitals for the past 24 hrs:  BP Temp Temp src Pulse Resp SpO2  05/25/14 0445 (!) 116/50 mmHg 98.3 F (36.8 C) Oral 67 19 95 %  05/24/14 2035 (!) 116/47 mmHg 98.2 F (36.8 C) Oral 60 18 97 %  05/24/14 1322 (!) 103/53 mmHg 97.9 F (36.6 C) Oral 65 18 99 %    Filed Weights   05/21/14 0630  Weight: 158 lb 11.7 oz (72 kg)    Hemodynamic parameters for last 24 hours:    Intake/Output from previous day: 02/15 0701 - 02/16 0700 In: 720 [P.O.:720] Out: 1400 [Urine:1400] Intake/Output this shift: Total I/O In: 240 [P.O.:240] Out: -   Scheduled Meds: . amiodarone  200 mg Oral TID  . aspirin EC  81 mg Oral Daily  . atorvastatin  40 mg Oral q1800  . carvedilol  3.125 mg Oral BID WC  . furosemide  20 mg Oral Daily  . loratadine  10 mg Oral Daily  . pantoprazole  40 mg Oral Daily   Continuous Infusions:  PRN Meds:.acetaminophen, docusate sodium, fluticasone, ondansetron (ZOFRAN) IV, sodium chloride, traMADol, traZODone  General appearance: alert and cooperative Neurologic: intact Heart: regular rate and rhythm, S1, S2 normal, no murmur, click, rub or gallop Lungs: clear to auscultation bilaterally Abdomen: soft, non-tender; bowel sounds normal; no masses,  no organomegaly Extremities: extremities normal, atraumatic, no cyanosis or edema and Homans sign is negative, no sign of DVT  Lab Results: Lab Results  Component Value Date   WBC 4.0 05/25/2014   HGB 10.6* 05/25/2014   HCT 30.5* 05/25/2014   PLT 211 05/25/2014   GLUCOSE 115* 05/20/2014   CHOL 146 11/19/2012   TRIG 93.0  11/19/2012   HDL 38.50* 11/19/2012   LDLCALC 89 11/19/2012   ALT 16 11/19/2012   AST 17 11/19/2012   NA 137 05/20/2014   K 3.7 05/20/2014   CL 101 05/20/2014   CREATININE 1.18 05/20/2014   BUN 13 05/20/2014   CO2 26 05/20/2014   TSH 1.14 04/12/2011   PSA 0.01* 02/20/2012   INR 1.01 04/29/2014   HGBA1C 5.7 07/11/2006   CBC: Recent Labs  05/24/14 0525 05/25/14 0418  WBC 4.2 4.0  HGB 10.9* 10.6*  HCT 31.7* 30.5*  PLT 216 211   BMET: No results for input(s): NA, K, CL, CO2, GLUCOSE, BUN, CREATININE, CALCIUM in the last 72 hours.  PT/INR: No results for input(s): LABPROT, INR in the last 72 hours.   Radiology No results found.   Assessment/Plan: S/P Procedure(s) (LRB): REDO CORONARY ARTERY BYPASS GRAFTING (CABG) (N/A) TRANSESOPHAGEAL ECHOCARDIOGRAM (TEE) (N/A) Considering redo CABG Friday depending on P2Y12 - Today is first day off Plavix and was reloaded over weekend Now off heparin, ? Should he be on short term anticoagulation during bridging off Plavix Suggest urology consult if heparin stopped due to hemorrhage in urine.   Grace Isaac MD 05/25/2014 11:54 AM

## 2014-05-25 NOTE — Progress Notes (Signed)
INITIAL NUTRITION ASSESSMENT  Pt meets criteria for SEVERE MALNUTRITION in the context of acute illness or injury as evidenced by a 8% weight loss in 1 month and moderate fat and muscle mass loss.  DOCUMENTATION CODES Per approved criteria  -Severe malnutrition in the context of acute illness or injury   INTERVENTION: Provide Ensure Complete po BID, each supplement provides 350 kcal and 13 grams of protein  NUTRITION DIAGNOSIS: Inadequate oral intake related to varied appetite as evidenced by meal completion of 40-100%.   Goal: Pt to meet >/= 90% of their estimated nutrition needs   Monitor:  PO intake, weight trends, labs, I/O's  Reason for Assessment: MST  74 y.o. male  Admitting Dx: NSTEMI  ASSESSMENT: Pt with hx of atrial fibrillation on amiodarone and eliquis, HTN, HLD, CAD s/p CABG 1993 (LIMA, SVG OM), PVD s/p intervention to L SFA and R SFA, and recent admission for STEMI on 04/29/14 presents with an NSTEMI in setting of atrial flutter with RVR.  Pt reports his appetite has been improving. Meal completion has been varied from 40-100%. Pt reports he has been in and out of the hospital this year and it has been effecting his appetite, however he reports he has been eating 3 meals a day at home. Pt reports weight loss with usual body weight of 170 lbs. Noted pt with a 8% weight loss in 1 month. Pt is agreeable to Ensure to aid in caloric and protein needs as well as to prevent further weight loss. RD to order. Pt was encouraged to eat his food at meals.   Nutrition Focused Physical Exam:  Subcutaneous Fat:  Orbital Region: N/A Upper Arm Region: Mild to moderate depletion Thoracic and Lumbar Region: WNL  Muscle:  Temple Region: N/A Clavicle Bone Region: Moderate depletion Clavicle and Acromion Bone Region: Moderate depletion Scapular Bone Region: N/A Dorsal Hand: N/A Patellar Region: N/A Anterior Thigh Region: Moderate depletion Posterior Calf Region: Mild  depletion  Edema: +1 generalized edema  Labs and medications reviewed.  Height: Ht Readings from Last 1 Encounters:  05/08/14 5\' 11"  (1.803 m)    Weight: Wt Readings from Last 1 Encounters:  05/21/14 158 lb 11.7 oz (72 kg)    Ideal Body Weight: 172 lbs  % Ideal Body Weight: 92%  Wt Readings from Last 10 Encounters:  05/21/14 158 lb 11.7 oz (72 kg)  05/13/14 153 lb 3.5 oz (69.5 kg)  04/14/14 172 lb (78.019 kg)  01/11/14 168 lb (76.204 kg)  09/02/13 173 lb (78.472 kg)  08/05/13 170 lb 12.8 oz (77.474 kg)  06/23/13 169 lb (76.658 kg)  03/25/13 172 lb (78.019 kg)  12/24/12 171 lb (77.565 kg)  12/04/12 172 lb (78.019 kg)    Usual Body Weight: 170 lbs  % Usual Body Weight: 93%  BMI:  Body mass index is 22.15 kg/(m^2).  Estimated Nutritional Needs: Kcal: 2505-3976 Protein: 90-100 grams Fluid: 1.9 - 2 L/day  Skin: + 1 generalized edema  Diet Order: Diet Heart  EDUCATION NEEDS: -No education needs identified at this time   Intake/Output Summary (Last 24 hours) at 05/25/14 0944 Last data filed at 05/25/14 0449  Gross per 24 hour  Intake    480 ml  Output   1400 ml  Net   -920 ml    Last BM: 2/15  Labs:   Recent Labs Lab 05/20/14 2353  NA 137  K 3.7  CL 101  CO2 26  BUN 13  CREATININE 1.18  CALCIUM 9.2  GLUCOSE 115*    CBG (last 3)  No results for input(s): GLUCAP in the last 72 hours.  Scheduled Meds: . amiodarone  200 mg Oral TID  . aspirin EC  81 mg Oral Daily  . atorvastatin  40 mg Oral q1800  . carvedilol  3.125 mg Oral BID WC  . furosemide  20 mg Oral Daily  . loratadine  10 mg Oral Daily  . pantoprazole  40 mg Oral Daily    Continuous Infusions: . nitroGLYCERIN Stopped (05/21/14 0352)    Past Medical History  Diagnosis Date  . CAD (coronary artery disease)     CABG x 2 with LIMA to Circumflex and vein graft to PDA  07/13/1991  . History of transient ischemic attack (TIA)   . Hyperlipidemia   . Peripheral neuropathy   .  PVD (peripheral vascular disease) with claudication   . Carotid bruit     bilateral  . Ventral hernia   . Personal history of prostate cancer   . Diverticulosis   . ALLERGIC RHINITIS   . Osteoarthritis   . COPD (chronic obstructive pulmonary disease)   . Tobacco use disorder, continuous   . STEMI (ST elevation myocardial infarction) 04/29/2014    PTCA Lmain and CFX, in setting of VF/VT arrest and CGS    Past Surgical History  Procedure Laterality Date  . Coronary artery bypass graft  1992  . Lumbar laminectomy  [id number]     X2  . Prostatectomy  05/2008    Dr. Alinda Money and XRT  . Appendectomy    . Tonsillectomy    . Left heart catheterization with coronary angiogram N/A 04/30/2014    Procedure: LEFT HEART CATHETERIZATION WITH CORONARY ANGIOGRAM;  Surgeon: Peter M Martinique, MD; Lmain 90% (s/p PTCA), LAD 80%, CFX 100% (s/p PTCA), RCA OK, LIMA-LAD atretic, SVG-OM 100% (chronic), EF 45%, pt req defib x 13 for VF/VT arrest, CHB w/ tem pacer, IABP and intubation    Kallie Locks, MS, RD, LDN Pager # [phone] After hours/ weekend pager # [phone]

## 2014-05-25 NOTE — Progress Notes (Addendum)
Urology office contacted for consult.  [name] PA-C 05/25/2014 12:06 PM

## 2014-05-25 NOTE — Progress Notes (Signed)
ANTICOAGULATION CONSULT NOTE - Follow Up Consult  Pharmacy Consult for heparin Indication: CAD, afib   Recent Labs  05/23/14 0424 05/23/14 1514 05/24/14 0525 05/25/14 0418 05/25/14 1830  HGB 11.4*  --  10.9* 10.6*  --   HCT 32.9*  --  31.7* 30.5*  --   PLT 263  --  216 211  --   APTT 116* 90* 28 28  --   HEPARINUNFRC 1.00*  --  <0.10* <0.10* 0.29*    Assessment: 74 yo on heparin for afib, CAD.  His heparin level is 0.29 units/ml after heparin was resumed earlier today. Goal of Therapy:  Heparin level 0.3-0.7 units/ml Monitor platelets by anticoagulation protocol: Yes   Plan:  - Cont heparin at 900 units/hr -f/u am labs - monitor for s/s of bleeding - f/u recommendations from urology  [name], [place] 05/25/2014,7:40 PM

## 2014-05-25 NOTE — Progress Notes (Signed)
Utilization review completed.  

## 2014-05-25 NOTE — Clinical Social Work Note (Signed)
Patient is on the schedule for surgery on Friday, CSW to continue to follow if patient needs SNF for rehab.  Jones Broom. [place], MSW, [place] 05/25/2014 5:19 PM

## 2014-05-26 ENCOUNTER — Inpatient Hospital Stay (HOSPITAL_COMMUNITY): Payer: Medicare Other

## 2014-05-26 LAB — BASIC METABOLIC PANEL
Anion gap: 6 (ref 5–15)
BUN: 11 mg/dL (ref 6–23)
CO2: 29 mmol/L (ref 19–32)
Calcium: 8.8 mg/dL (ref 8.4–10.5)
Chloride: 101 mmol/L (ref 96–112)
Creatinine, Ser: 1.05 mg/dL (ref 0.50–1.35)
GFR calc Af Amer: 78 mL/min — ABNORMAL LOW (ref >90)
GFR calc non Af Amer: 67 mL/min — ABNORMAL LOW (ref >90)
Glucose, Bld: 97 mg/dL (ref 70–99)
Potassium: 3.7 mmol/L (ref 3.5–5.1)
Sodium: 136 mmol/L (ref 135–145)

## 2014-05-26 LAB — CBC
HCT: 31 % — ABNORMAL LOW (ref 39.0–52.0)
Hemoglobin: 10.6 g/dL — ABNORMAL LOW (ref 13.0–17.0)
MCH: 33 pg (ref 26.0–34.0)
MCHC: 34.2 g/dL (ref 30.0–36.0)
MCV: 96.6 fL (ref 78.0–100.0)
Platelets: 171 10*3/uL (ref 150–400)
RBC: 3.21 MIL/uL — ABNORMAL LOW (ref 4.22–5.81)
RDW: 13.4 % (ref 11.5–15.5)
WBC: 4.2 10*3/uL (ref 4.0–10.5)

## 2014-05-26 LAB — PLATELET INHIBITION P2Y12
Platelet Function  P2Y12: 272 [PRU] (ref 194–418)
Platelet Function  P2Y12: 243 [PRU] (ref 194–418)

## 2014-05-26 LAB — HEPARIN LEVEL (UNFRACTIONATED): HEPARIN UNFRACTIONATED: 0.39 [IU]/mL (ref 0.30–0.70)

## 2014-05-26 MED ORDER — AMIODARONE HCL 200 MG PO TABS
200.0000 mg | ORAL_TABLET | Freq: Two times a day (BID) | ORAL | Status: DC
  Administered 2014-05-26 – 2014-05-27 (×3): 200 mg via ORAL
  Filled 2014-05-26 (×5): qty 1

## 2014-05-26 MED ORDER — IOHEXOL 300 MG/ML  SOLN
100.0000 mL | Freq: Once | INTRAMUSCULAR | Status: AC | PRN
  Administered 2014-05-26: 100 mL via INTRAVENOUS

## 2014-05-26 NOTE — Progress Notes (Signed)
CARDIAC REHAB PHASE I   PRE:  Rate/Rhythm: 60 SR  BP:  Supine:   Sitting: 100/82  Standing:    SaO2: 100 RA  MODE:  Ambulation: 510 ft   POST:  Rate/Rhythm: 64  BP:  Supine:   Sitting: 121/42  Standing:    SaO2: 98 RA 1305-1330 Assisted X 1 to ambulate and pt used his cane. He was able to walk 510 feet without c/o. VS stable Pt to side of bed after walk with call light in reach and family present.  Rodney Langton RN 05/26/2014 1:26 PM

## 2014-05-26 NOTE — Progress Notes (Signed)
Subjective:  No chest pain overnight, he is up in hallway ambulating  . amiodarone  200 mg Oral TID  . aspirin EC  81 mg Oral Daily  . atorvastatin  40 mg Oral q1800  . carvedilol  3.125 mg Oral BID WC  . feeding supplement (ENSURE COMPLETE)  237 mL Oral BID BM  . furosemide  20 mg Oral Daily  . loratadine  10 mg Oral Daily  . pantoprazole  40 mg Oral Daily   Objective:  Vital Signs in the last 24 hours: Temp:  [97.8 F (36.6 C)-98.1 F (36.7 C)] 97.8 F (36.6 C) (02/17 0344) Pulse Rate:  [59-64] 59 (02/17 0344) Resp:  [18] 18 (02/17 0344) BP: (112-121)/(56-66) 121/57 mmHg (02/17 0344) SpO2:  [95 %-98 %] 98 % (02/17 0344) Weight:  [158 lb 11.7 oz (72 kg)] 158 lb 11.7 oz (72 kg) (02/17 0821)  Intake/Output from previous day:  Intake/Output Summary (Last 24 hours) at 05/26/14 0959 Last data filed at 05/26/14 0900  Gross per 24 hour  Intake    720 ml  Output   1001 ml  Net   -281 ml    Physical Exam: General appearance: alert, cooperative and no distress Lungs: clear to auscultation bilaterally Heart: regular rate and rhythm   Rate: 66  Rhythm: normal sinus rhythm  Lab Results:  Recent Labs  05/25/14 0418 05/26/14 0600  WBC 4.0 4.2  HGB 10.6* 10.6*  PLT 211 171    Recent Labs  05/26/14 0600  NA 136  K 3.7  CL 101  CO2 29  GLUCOSE 97  BUN 11  CREATININE 1.05   Imaging: Imaging results have been reviewed  Cardiac Studies:  Assessment/Plan:  [name] with history of remote CABG in 1993 presented with Acute inferior STEMI 05/04/14 associated with cardiogenic shock, and complete heart block. He underwent urgent PCI to LM and CFX. He was not felt to be a candidate for re do CABG then. He was discharged 05/14/14 and re admitted 05/20/14 with arm pain and Troponin bump (0.25). He has been re evaluated by Dr Servando Snare and is on the schedule for re do CABG Friday (Plavix washout).   Principal Problem:   NSTEMI (non-ST elevated myocardial  infarction) Active Problems:   STEMI 05/04/14 with shock, CPR, CHB, VT   CAD S/P LM and CFX PCI 05/04/14   S/P CABG x 2   PAF- on Amiodarone   Chronic anticoagulation-on Eliquis at discharge 05/11/14   ICM-EF 45-50% by echo 04/30/14   Hyperlipidemia   Hereditary and idiopathic peripheral neuropathy   Protein-calorie malnutrition, severe   PLAN: He is back on Heparin, urine is clear. Plan is for redo CABG Friday. Consider decreasing Amiodarone to 200 mg BID (bumped up to TID this admission for PAF).  [name] PA-C Beeper [phone] 05/26/2014, 9:59 AM   The patient was seen, examined and discussed with [name], PA-C and I agree with the above.   Active Problems:  NSTEMI (non-ST elevated myocardial infarction)  1. CAD: Has severe CAD, not amenable to PCI. He has been seen by Dr Servando Snare several weeks but was not a candidate for re-do CABG at that time. He came back with NSTEMI in the settings of a-fib/flutter with RVR, on amiodarone, currently in SR with very frequent PACs.  Heparin stopped yesterday after 3 episodes of hematuria. Chest pain has resolved with NTG, currently asymptomatic even with walking.  Dr Servando Snare is willing to operate on him this Friday  if we are able to restart Heparin. He hasn't had any hematuria for 48 hours, heparin was restarted yesterday, so far clean urine, urology consult is pending.  Off Plavix. We will send P2Y12.   2. Atrial flutter: Remaining in SR< decrease amiodarone dose to 20 mg po BID.  3. Hyperlipidemia: continue atorvastatin 40 mg po daily  [name] 05/26/2014

## 2014-05-26 NOTE — Progress Notes (Signed)
Patient ID: [name], male   DOB: 1940/04/27, 74 y.o.   MRN: [id number]    Subjective: Pt voiding clear today.  Objective: Vital signs in last 24 hours: Temp:  [97.5 F (36.4 C)-98.1 F (36.7 C)] 98.1 F (36.7 C) (02/17 2051) Pulse Rate:  [57-61] 61 (02/17 2051) Resp:  [18] 18 (02/17 2051) BP: (120-121)/(42-57) 120/55 mmHg (02/17 2051) SpO2:  [96 %-99 %] 99 % (02/17 2051) Weight:  [72 kg (158 lb 11.7 oz)] 72 kg (158 lb 11.7 oz) (02/17 0821)  Intake/Output from previous day: 02/16 0701 - 02/17 0700 In: 720 [P.O.:720] Out: 1051 [Urine:1050; Stool:1] Intake/Output this shift:    Physical Exam:  General: Alert and oriented Abdomen: Soft, ND  Lab Results:  Recent Labs  05/24/14 0525 05/25/14 0418 05/26/14 0600  HGB 10.9* 10.6* 10.6*  HCT 31.7* 30.5* 31.0*   BMET  Recent Labs  05/26/14 0600  NA 136  K 3.7  CL 101  CO2 29  GLUCOSE 97  BUN 11  CREATININE 1.05  CALCIUM 8.8     Studies/Results: Ct Abdomen Pelvis W Wo Contrast  05/26/2014   CLINICAL DATA:  Status post robotic radical prostatectomy 6 years ago with chronic painless hematuria, preop cardiac bypass on Friday  EXAM: CT ABDOMEN AND PELVIS WITHOUT AND WITH CONTRAST  TECHNIQUE: Multidetector CT imaging of the abdomen and pelvis was performed following the standard protocol before and following the bolus administration of intravenous contrast.  CONTRAST:  159mL OMNIPAQUE IOHEXOL 300 MG/ML  SOLN  COMPARISON:  11/04/2009  FINDINGS: Lower chest: Emphysematous changes with dependent atelectasis at the lung bases.  Hepatobiliary: 3 mm cyst in the posterior segment right hepatic lobe (series 301/ image 12). No suspicious/enhancing hepatic lesions.  Layering gallbladder sludge and/or gallstones (series 31/ image 34). No intrahepatic or extrahepatic ductal dilatation.  Pancreas: Within normal limits.  Spleen: Within normal limits.  Adrenals/Urinary Tract: Adrenal glands are unremarkable.  4 mm cyst in the  lateral left upper kidney. Right kidney is within normal limits. No enhancing renal lesions. No hydronephrosis.  No renal, ureteral, or bladder calculi.  Bladder is within normal limits.  Stomach/Bowel: Stomach is moderately distended but within normal limits.  No evidence of bowel obstruction.  Rectosigmoid colon is mildly thick-walled but underdistended.  Vascular/Lymphatic: Atherosclerotic calcifications of the abdominal aorta and branch vessels.  No suspicious abdominopelvic lymphadenopathy.  Reproductive: Status post prostatectomy.  Other: No abdominopelvic ascites.  Tiny fat containing right inguinal hernia.  Musculoskeletal: Degenerative changes of the visualized thoracolumbar spine.  IMPRESSION: No renal, ureteral, or bladder calculi.  No hydronephrosis.  4 mm cyst in the left upper kidney.  No enhancing renal lesions.  Status post prostatectomy. No evidence of metastatic disease in the abdomen/pelvis.   Electronically Signed   By: Julian Hy M.D.   On: 05/26/2014 12:32    Assessment/Plan: Gross hematuria: CT scan without concerning findings for cause of hematuria.  His urine is now grossly clear.  He can proceed with cardiac surgery and anticoagulation without concern from a urologic standpoint.  I will have him follow up for outpatient cystoscopy after he recovers from his CABG.   LOS: 5 days   Peggyann Zwiefelhofer,LES 05/26/2014, 11:26 PM

## 2014-05-26 NOTE — Progress Notes (Signed)
ANTICOAGULATION CONSULT NOTE - Follow Up Consult  Pharmacy Consult for heparin Indication: CAD, Afib  Allergies  Allergen Reactions  . Codeine Sulfate Itching    Patient Measurements: Height: 5\' 11"  (180.3 cm) Weight: 158 lb 11.7 oz (72 kg) IBW/kg (Calculated) : 75.3 Heparin Dosing Weight: 72 kg  Vital Signs: Temp: 97.8 F (36.6 C) (02/17 0344) Temp Source: Oral (02/17 0344) BP: 121/57 mmHg (02/17 0344) Pulse Rate: 59 (02/17 0344)  Labs:  Recent Labs  05/23/14 1514  05/24/14 0525 05/25/14 0418 05/25/14 1830 05/26/14 0600  HGB  --   < > 10.9* 10.6*  --  10.6*  HCT  --   --  31.7* 30.5*  --  31.0*  PLT  --   --  216 211  --  171  APTT 90*  --  28 28  --   --   HEPARINUNFRC  --   < > <0.10* <0.10* 0.29* 0.39  < > = values in this interval not displayed.  Estimated Creatinine Clearance: 55.1 mL/min (by C-G formula based on Cr of 1.18).  Assessment: Patient is a 74 y.o M with heparin resumed on 2/16 for CAD and Afib.  CBC remains stable. No new bleeding documented and urology has cleared patient for surgery on 2/19.  Heparin level is at goal with 0.39.  Goal of Therapy:  Heparin level 0.3-0.7 units/ml Monitor platelets by anticoagulation protocol: Yes   Plan:  - Cont heparin drip at 900 units/hr - monitor for s/s of bleeding  Miaa Latterell P 05/26/2014,9:29 AM

## 2014-05-27 LAB — COMPREHENSIVE METABOLIC PANEL
ALT: 30 U/L (ref 0–53)
AST: 24 U/L (ref 0–37)
Albumin: 3.7 g/dL (ref 3.5–5.2)
Alkaline Phosphatase: 56 U/L (ref 39–117)
Anion gap: 13 (ref 5–15)
BUN: 13 mg/dL (ref 6–23)
CO2: 25 mmol/L (ref 19–32)
Calcium: 9.3 mg/dL (ref 8.4–10.5)
Chloride: 100 mmol/L (ref 96–112)
Creatinine, Ser: 1.04 mg/dL (ref 0.50–1.35)
GFR calc Af Amer: 79 mL/min — ABNORMAL LOW (ref >90)
GFR calc non Af Amer: 68 mL/min — ABNORMAL LOW (ref >90)
Glucose, Bld: 101 mg/dL — ABNORMAL HIGH (ref 70–99)
Potassium: 3.9 mmol/L (ref 3.5–5.1)
Sodium: 138 mmol/L (ref 135–145)
Total Bilirubin: 0.6 mg/dL (ref 0.3–1.2)
Total Protein: 6.2 g/dL (ref 6.0–8.3)

## 2014-05-27 LAB — CBC
HEMATOCRIT: 33.7 % — AB (ref 39.0–52.0)
HEMOGLOBIN: 11.3 g/dL — AB (ref 13.0–17.0)
MCH: 32.9 pg (ref 26.0–34.0)
MCHC: 33.5 g/dL (ref 30.0–36.0)
MCV: 98.3 fL (ref 78.0–100.0)
Platelets: 187 10*3/uL (ref 150–400)
RBC: 3.43 MIL/uL — ABNORMAL LOW (ref 4.22–5.81)
RDW: 13.8 % (ref 11.5–15.5)
WBC: 3.8 10*3/uL — ABNORMAL LOW (ref 4.0–10.5)

## 2014-05-27 LAB — PREPARE RBC (CROSSMATCH)

## 2014-05-27 LAB — PLATELET INHIBITION P2Y12: Platelet Function  P2Y12: 233 [PRU] (ref 194–418)

## 2014-05-27 LAB — ABO/RH: ABO/RH(D): O POS

## 2014-05-27 LAB — PROTIME-INR
INR: 1.08 (ref 0.00–1.49)
Prothrombin Time: 14.1 seconds (ref 11.6–15.2)

## 2014-05-27 LAB — HEPARIN LEVEL (UNFRACTIONATED): Heparin Unfractionated: 0.57 IU/mL (ref 0.30–0.70)

## 2014-05-27 MED ORDER — BISACODYL 5 MG PO TBEC: 5.0000 mg | DELAYED_RELEASE_TABLET | Freq: Once | ORAL | Status: DC

## 2014-05-27 MED ORDER — METOPROLOL TARTRATE 12.5 MG HALF TABLET
12.5000 mg | ORAL_TABLET | Freq: Once | ORAL | Status: AC
  Administered 2014-05-28: 12.5 mg via ORAL
  Filled 2014-05-27: qty 1

## 2014-05-27 MED ORDER — DEXTROSE 5 % IV SOLN
0.0000 ug/min | INTRAVENOUS | Status: DC
  Filled 2014-05-27: qty 4

## 2014-05-27 MED ORDER — CHLORHEXIDINE GLUCONATE CLOTH 2 % EX PADS
6.0000 | MEDICATED_PAD | Freq: Once | CUTANEOUS | Status: AC
  Administered 2014-05-28: 6 via TOPICAL

## 2014-05-27 MED ORDER — POTASSIUM CHLORIDE 2 MEQ/ML IV SOLN
80.0000 meq | INTRAVENOUS | Status: DC
  Filled 2014-05-27: qty 40

## 2014-05-27 MED ORDER — SODIUM CHLORIDE 0.9 % IV SOLN
INTRAVENOUS | Status: AC
  Administered 2014-05-28: 69.8 mL/h via INTRAVENOUS
  Filled 2014-05-27: qty 40

## 2014-05-27 MED ORDER — VANCOMYCIN HCL 10 G IV SOLR
1250.0000 mg | INTRAVENOUS | Status: AC
  Administered 2014-05-28: 1250 mg via INTRAVENOUS
  Filled 2014-05-27: qty 1250

## 2014-05-27 MED ORDER — DEXTROSE 5 % IV SOLN
750.0000 mg | INTRAVENOUS | Status: DC
  Filled 2014-05-27: qty 750

## 2014-05-27 MED ORDER — PLASMA-LYTE 148 IV SOLN
INTRAVENOUS | Status: AC
  Administered 2014-05-28: 500 mL
  Filled 2014-05-27: qty 2.5

## 2014-05-27 MED ORDER — MAGNESIUM SULFATE 50 % IJ SOLN
40.0000 meq | INTRAMUSCULAR | Status: DC
  Filled 2014-05-27: qty 10

## 2014-05-27 MED ORDER — DEXTROSE 5 % IV SOLN
30.0000 ug/min | INTRAVENOUS | Status: DC
  Filled 2014-05-27: qty 2

## 2014-05-27 MED ORDER — CEFUROXIME SODIUM 1.5 G IJ SOLR
1.5000 g | INTRAMUSCULAR | Status: AC
  Administered 2014-05-28: 1.5 g via INTRAVENOUS
  Administered 2014-05-28: .75 g via INTRAVENOUS
  Filled 2014-05-27: qty 1.5

## 2014-05-27 MED ORDER — TEMAZEPAM 15 MG PO CAPS
15.0000 mg | ORAL_CAPSULE | Freq: Once | ORAL | Status: AC | PRN
  Filled 2014-05-27: qty 1

## 2014-05-27 MED ORDER — CHLORHEXIDINE GLUCONATE CLOTH 2 % EX PADS
6.0000 | MEDICATED_PAD | Freq: Once | CUTANEOUS | Status: AC
  Administered 2014-05-27: 6 via TOPICAL

## 2014-05-27 MED ORDER — DOPAMINE-DEXTROSE 3.2-5 MG/ML-% IV SOLN
0.0000 ug/kg/min | INTRAVENOUS | Status: DC
  Filled 2014-05-27: qty 250

## 2014-05-27 MED ORDER — NITROGLYCERIN IN D5W 200-5 MCG/ML-% IV SOLN
2.0000 ug/min | INTRAVENOUS | Status: DC
  Filled 2014-05-27: qty 250

## 2014-05-27 MED ORDER — SODIUM CHLORIDE 0.9 % IV SOLN
INTRAVENOUS | Status: DC
  Filled 2014-05-27: qty 30

## 2014-05-27 MED ORDER — DEXMEDETOMIDINE HCL IN NACL 400 MCG/100ML IV SOLN
0.1000 ug/kg/h | INTRAVENOUS | Status: AC
  Administered 2014-05-28: 0.3 ug/kg/h via INTRAVENOUS
  Filled 2014-05-27: qty 100

## 2014-05-27 MED ORDER — SODIUM CHLORIDE 0.9 % IV SOLN
INTRAVENOUS | Status: AC
  Administered 2014-05-28: 1.3 [IU]/h via INTRAVENOUS
  Filled 2014-05-27: qty 2.5

## 2014-05-27 NOTE — Progress Notes (Signed)
ANTICOAGULATION CONSULT NOTE - Follow Up Consult  Pharmacy Consult for heparin Indication: CAD, Afib  Allergies  Allergen Reactions  . Codeine Sulfate Itching    Patient Measurements: Height: 5\' 11"  (180.3 cm) Weight: 153 lb (69.4 kg) IBW/kg (Calculated) : 75.3 Heparin Dosing Weight: 72 kg  Vital Signs: Temp: 97.8 F (36.6 C) (02/18 0412) Temp Source: Oral (02/18 0412) BP: 101/41 mmHg (02/18 0412) Pulse Rate: 59 (02/18 0412)  Labs:  Recent Labs  05/25/14 0418 05/25/14 1830 05/26/14 0600 05/27/14 0437  HGB 10.6*  --  10.6* 11.3*  HCT 30.5*  --  31.0* 33.7*  PLT 211  --  171 187  APTT 28  --   --   --   LABPROT  --   --   --  14.1  INR  --   --   --  1.08  HEPARINUNFRC <0.10* 0.29* 0.39 0.57  CREATININE  --   --  1.05 1.04    Estimated Creatinine Clearance: 60.2 mL/min (by C-G formula based on Cr of 1.04).  Assessment: Patient is a 74 y.o M with heparin resumed on 2/16 for CAD and Afib currently awaiting plavix washout prior to CABG. P2Y12= 233 -- plan to proceed with CABG on 2/19. CBC remains stable. No new bleeding documented and urology has cleared patient for surgery on 2/19. Heparin level is at goal with 0.57.    Goal of Therapy:  Heparin level 0.3-0.7 units/ml Monitor platelets by anticoagulation protocol: Yes   Plan:  - Cont heparin drip at 900 units/hr -- for CABG on Friday 2/19   [name] P 05/27/2014,8:58 AM

## 2014-05-27 NOTE — Progress Notes (Signed)
Subjective:  No complains, CABG scheduled for tomorrow.  Marland Kitchen amiodarone  200 mg Oral BID  . aspirin EC  81 mg Oral Daily  . atorvastatin  40 mg Oral q1800  . carvedilol  3.125 mg Oral BID WC  . feeding supplement (ENSURE COMPLETE)  237 mL Oral BID BM  . furosemide  20 mg Oral Daily  . loratadine  10 mg Oral Daily  . pantoprazole  40 mg Oral Daily   Objective:  Vital Signs in the last 24 hours: Temp:  [97.5 F (36.4 C)-98.1 F (36.7 C)] 97.8 F (36.6 C) (02/18 0412) Pulse Rate:  [57-61] 59 (02/18 0412) Resp:  [18] 18 (02/18 0412) BP: (101-121)/(41-55) 101/41 mmHg (02/18 0412) SpO2:  [96 %-100 %] 100 % (02/18 0412) Weight:  [153 lb (69.4 kg)] 153 lb (69.4 kg) (02/18 0412)  Intake/Output from previous day:  Intake/Output Summary (Last 24 hours) at 05/27/14 0853 Last data filed at 05/27/14 0600  Gross per 24 hour  Intake    360 ml  Output   2955 ml  Net  -2595 ml    Physical Exam: General appearance: alert, cooperative and no distress Lungs: clear to auscultation bilaterally Heart: regular rate and rhythm   Rate: 66  Rhythm: normal sinus rhythm  Lab Results:  Recent Labs  05/26/14 0600 05/27/14 0437  WBC 4.2 3.8*  HGB 10.6* 11.3*  PLT 171 187    Recent Labs  05/26/14 0600 05/27/14 0437  NA 136 138  K 3.7 3.9  CL 101 100  CO2 29 25  GLUCOSE 97 101*  BUN 11 13  CREATININE 1.05 1.04   Imaging: Imaging results have been reviewed  Cardiac Studies:  Assessment/Plan:  [name] with history of remote CABG in 1993 presented with Acute inferior STEMI 05/04/14 associated with cardiogenic shock, and complete heart block. He underwent urgent PCI to LM and CFX. He was not felt to be a candidate for re do CABG then. He was discharged 05/14/14 and re admitted 05/20/14 with arm pain and Troponin bump (0.25). He has been re evaluated by Dr Servando Snare and is on the schedule for re do CABG Friday (Plavix washout).   Principal Problem:   NSTEMI (non-ST elevated  myocardial infarction) Active Problems:   Hyperlipidemia   Hereditary and idiopathic peripheral neuropathy   STEMI 05/04/14 with shock, CPR, CHB, VT   CAD S/P LM and CFX PCI 05/04/14   S/P CABG x 2   PAF- on Amiodarone   Chronic anticoagulation-on Eliquis at discharge 05/11/14   ICM-EF 45-50% by echo 04/30/14   Protein-calorie malnutrition, severe   PLAN: He is back on Heparin, urine is clear. Plan is for redo CABG Friday. Consider decreasing Amiodarone to 200 mg BID (bumped up to TID this admission for PAF).  [name] PA-C Beeper [phone] 05/27/2014, 8:53 AM   The patient was seen, examined and discussed with [name], PA-C and I agree with the above.   Active Problems:  NSTEMI (non-ST elevated myocardial infarction)  1. CAD: Has severe CAD, not amenable to PCI. He has been seen by Dr Servando Snare several weeks but was not a candidate for re-do CABG at that time. He came back with NSTEMI in the settings of a-fib/flutter with RVR, on amiodarone, currently in SR with very frequent PACs.  Heparin stopped yesterday after 3 episodes of hematuria. Chest pain has resolved with NTG, currently asymptomatic even with walking.  Dr Servando Snare is willing to operate on him this Friday  if we are able to restart Heparin. He hasn't had any hematuria for 48 hours, heparin was restarted yesterday, so far clean urine, urology consult is pending.  Off Plavix.  P2Y12 233.   2. Atrial flutter: Remaining in SR< decrease amiodarone dose to 20 mg po BID.  3. Hyperlipidemia: continue atorvastatin 40 mg po daily  4. Hematuria - resolved, on Heparin, seen by urology that states: Gross hematuria: CT scan without concerning findings for cause of hematuria. His urine is now grossly clear. He can proceed with cardiac surgery and anticoagulation without concern from a urologic standpoint. I will have him follow up for outpatient cystoscopy after he recovers from his CABG.  [name] 05/27/2014

## 2014-05-27 NOTE — Progress Notes (Signed)
1330 Pt observed walking in hall independently. Will follow up after surgery. Graylon Good RN BSN 05/27/2014 1:38 PM

## 2014-05-27 NOTE — Progress Notes (Signed)
[name]       [name] [address]             [phone]                   Procedure(s) (LRB): REDO CORONARY ARTERY BYPASS GRAFTING (CABG) (N/A) TRANSESOPHAGEAL ECHOCARDIOGRAM (TEE) (N/A)  LOS: 6 days   Subjective: No chest pain, urine clear  Objective: Vital signs in last 24 hours: Patient Vitals for the past 24 hrs:  BP Temp Temp src Pulse Resp SpO2 Weight  05/27/14 0412 (!) 101/41 mmHg 97.8 F (36.6 C) Oral (!) 59 18 100 % 153 lb (69.4 kg)  05/26/14 2051 (!) 120/55 mmHg 98.1 F (36.7 C) Oral 61 18 99 % -  05/26/14 1331 (!) 121/42 mmHg 97.5 F (36.4 C) Oral (!) 57 18 96 % -    Filed Weights   05/21/14 0630 05/26/14 0821 05/27/14 0412  Weight: 158 lb 11.7 oz (72 kg) 158 lb 11.7 oz (72 kg) 153 lb (69.4 kg)    Hemodynamic parameters for last 24 hours:    Intake/Output from previous day: 02/17 0701 - 02/18 0700 In: 600 [P.O.:600] Out: 2955 [Urine:2955] Intake/Output this shift:    Scheduled Meds: . amiodarone  200 mg Oral BID  . aspirin EC  81 mg Oral Daily  . atorvastatin  40 mg Oral q1800  . carvedilol  3.125 mg Oral BID WC  . feeding supplement (ENSURE COMPLETE)  237 mL Oral BID BM  . furosemide  20 mg Oral Daily  . loratadine  10 mg Oral Daily  . pantoprazole  40 mg Oral Daily   Continuous Infusions: . heparin 900 Units/hr (05/26/14 1725)   PRN Meds:.acetaminophen, docusate sodium, fluticasone, ondansetron (ZOFRAN) IV, sodium chloride, traMADol, traZODone  General appearance: alert and cooperative Neurologic: intact Heart: regular rate and rhythm, S1, S2 normal, no murmur, click, rub or gallop Lungs: clear to auscultation bilaterally Abdomen: soft, non-tender; bowel sounds normal; no masses,  no organomegaly Extremities: extremities normal, atraumatic, no cyanosis or edema and Homans sign is negative, no sign of DVT  Lab Results: CBC: Recent Labs  05/26/14 0600 05/27/14 0437  WBC 4.2 3.8*  HGB 10.6* 11.3*  HCT 31.0*  33.7*  PLT 171 187   BMET:  Recent Labs  05/26/14 0600 05/27/14 0437  NA 136 138  K 3.7 3.9  CL 101 100  CO2 29 25  GLUCOSE 97 101*  BUN 11 13  CREATININE 1.05 1.04  CALCIUM 8.8 9.3    PT/INR:  Recent Labs  05/27/14 0437  LABPROT 14.1  INR 1.08     Radiology Ct Abdomen Pelvis W Wo Contrast  05/26/2014   CLINICAL DATA:  Status post robotic radical prostatectomy 6 years ago with chronic painless hematuria, preop cardiac bypass on Friday  EXAM: CT ABDOMEN AND PELVIS WITHOUT AND WITH CONTRAST  TECHNIQUE: Multidetector CT imaging of the abdomen and pelvis was performed following the standard protocol before and following the bolus administration of intravenous contrast.  CONTRAST:  158mL OMNIPAQUE IOHEXOL 300 MG/ML  SOLN  COMPARISON:  11/04/2009  FINDINGS: Lower chest: Emphysematous changes with dependent atelectasis at the lung bases.  Hepatobiliary: 3 mm cyst in the posterior segment right hepatic lobe (series 301/ image 12). No suspicious/enhancing hepatic lesions.  Layering gallbladder sludge and/or gallstones (series 31/ image 34). No intrahepatic or extrahepatic ductal dilatation.  Pancreas: Within normal limits.  Spleen: Within normal limits.  Adrenals/Urinary Tract: Adrenal glands are  unremarkable.  4 mm cyst in the lateral left upper kidney. Right kidney is within normal limits. No enhancing renal lesions. No hydronephrosis.  No renal, ureteral, or bladder calculi.  Bladder is within normal limits.  Stomach/Bowel: Stomach is moderately distended but within normal limits.  No evidence of bowel obstruction.  Rectosigmoid colon is mildly thick-walled but underdistended.  Vascular/Lymphatic: Atherosclerotic calcifications of the abdominal aorta and branch vessels.  No suspicious abdominopelvic lymphadenopathy.  Reproductive: Status post prostatectomy.  Other: No abdominopelvic ascites.  Tiny fat containing right inguinal hernia.  Musculoskeletal: Degenerative changes of the visualized  thoracolumbar spine.  IMPRESSION: No renal, ureteral, or bladder calculi.  No hydronephrosis.  4 mm cyst in the left upper kidney.  No enhancing renal lesions.  Status post prostatectomy. No evidence of metastatic disease in the abdomen/pelvis.   Electronically Signed   By: Julian Hy M.D.   On: 05/26/2014 12:32     Assessment/Plan: S/P Procedure(s) (LRB): REDO CORONARY ARTERY BYPASS GRAFTING (CABG) (N/A) TRANSESOPHAGEAL ECHOCARDIOGRAM (TEE) (N/A) Plan redo CABG tomorrow  The goals risks and alternatives of the planned surgical procedure redo cabg  have been discussed with the patient in detail. The risks of the procedure including death, infection, stroke, myocardial infarction, bleeding, blood transfusion have all been discussed specifically.  I have quoted [name] a 8 % of perioperative mortality and a complication rate as high as 30 %. The patient's questions have been answered.[name] is willing  to proceed with the planned procedure.   Nathan Isaac MD 05/27/2014 8:56 AM

## 2014-05-28 ENCOUNTER — Inpatient Hospital Stay (HOSPITAL_COMMUNITY): Payer: Medicare Other | Admitting: Anesthesiology

## 2014-05-28 ENCOUNTER — Encounter (HOSPITAL_COMMUNITY)
Admission: EM | Disposition: A | Discharge status: Skilled Nursing Facility | Payer: Medicare Other | Source: Home / Self Care | Attending: Cardiothoracic Surgery

## 2014-05-28 ENCOUNTER — Encounter (HOSPITAL_COMMUNITY): Payer: Self-pay | Admitting: Certified Registered"

## 2014-05-28 ENCOUNTER — Inpatient Hospital Stay (HOSPITAL_COMMUNITY): Payer: Medicare Other

## 2014-05-28 DIAGNOSIS — Z951 Presence of aortocoronary bypass graft: Secondary | ICD-10-CM

## 2014-05-28 DIAGNOSIS — I25110 Atherosclerotic heart disease of native coronary artery with unstable angina pectoris: Secondary | ICD-10-CM

## 2014-05-28 HISTORY — PX: CORONARY ARTERY BYPASS GRAFT: SHX141

## 2014-05-28 HISTORY — PX: TEE WITHOUT CARDIOVERSION: SHX5443

## 2014-05-28 LAB — POCT I-STAT, CHEM 8
BUN: 8 mg/dL (ref 6–23)
BUN: 7 mg/dL (ref 6–23)
BUN: 6 mg/dL (ref 6–23)
BUN: 6 mg/dL (ref 6–23)
BUN: 7 mg/dL (ref 6–23)
BUN: 7 mg/dL (ref 6–23)
BUN: 8 mg/dL (ref 6–23)
CALCIUM ION: 1.01 mmol/L — AB (ref 1.13–1.30)
CALCIUM ION: 1.11 mmol/L — AB (ref 1.13–1.30)
CALCIUM ION: 1.12 mmol/L — AB (ref 1.13–1.30)
CALCIUM ION: 1.21 mmol/L (ref 1.13–1.30)
CHLORIDE: 101 mmol/L (ref 96–112)
CHLORIDE: 99 mmol/L (ref 96–112)
CHLORIDE: 99 mmol/L (ref 96–112)
CHLORIDE: 104 mmol/L (ref 96–112)
CREATININE: 0.7 mg/dL (ref 0.50–1.35)
CREATININE: 0.5 mg/dL (ref 0.50–1.35)
CREATININE: 0.6 mg/dL (ref 0.50–1.35)
CREATININE: 0.8 mg/dL (ref 0.50–1.35)
CREATININE: 0.8 mg/dL (ref 0.50–1.35)
Calcium, Ion: 1.23 mmol/L (ref 1.13–1.30)
Calcium, Ion: 1.24 mmol/L (ref 1.13–1.30)
Calcium, Ion: 1.06 mmol/L — ABNORMAL LOW (ref 1.13–1.30)
Chloride: 99 mmol/L (ref 96–112)
Chloride: 103 mmol/L (ref 96–112)
Chloride: 98 mmol/L (ref 96–112)
Creatinine, Ser: 0.7 mg/dL (ref 0.50–1.35)
Creatinine, Ser: 0.7 mg/dL (ref 0.50–1.35)
GLUCOSE: 127 mg/dL — AB (ref 70–99)
GLUCOSE: 125 mg/dL — AB (ref 70–99)
GLUCOSE: 138 mg/dL — AB (ref 70–99)
Glucose, Bld: 102 mg/dL — ABNORMAL HIGH (ref 70–99)
Glucose, Bld: 115 mg/dL — ABNORMAL HIGH (ref 70–99)
Glucose, Bld: 141 mg/dL — ABNORMAL HIGH (ref 70–99)
Glucose, Bld: 157 mg/dL — ABNORMAL HIGH (ref 70–99)
HCT: 26 % — ABNORMAL LOW (ref 39.0–52.0)
HCT: 21 % — ABNORMAL LOW (ref 39.0–52.0)
HCT: 21 % — ABNORMAL LOW (ref 39.0–52.0)
HCT: 24 % — ABNORMAL LOW (ref 39.0–52.0)
HCT: 22 % — ABNORMAL LOW (ref 39.0–52.0)
HCT: 31 % — ABNORMAL LOW (ref 39.0–52.0)
HEMATOCRIT: 27 % — AB (ref 39.0–52.0)
Hemoglobin: 9.2 g/dL — ABNORMAL LOW (ref 13.0–17.0)
Hemoglobin: 8.8 g/dL — ABNORMAL LOW (ref 13.0–17.0)
Hemoglobin: 7.1 g/dL — ABNORMAL LOW (ref 13.0–17.0)
Hemoglobin: 7.1 g/dL — ABNORMAL LOW (ref 13.0–17.0)
Hemoglobin: 8.2 g/dL — ABNORMAL LOW (ref 13.0–17.0)
Hemoglobin: 7.5 g/dL — ABNORMAL LOW (ref 13.0–17.0)
Hemoglobin: 10.5 g/dL — ABNORMAL LOW (ref 13.0–17.0)
POTASSIUM: 3.8 mmol/L (ref 3.5–5.1)
Potassium: 4 mmol/L (ref 3.5–5.1)
Potassium: 3.9 mmol/L (ref 3.5–5.1)
Potassium: 4.2 mmol/L (ref 3.5–5.1)
Potassium: 4.4 mmol/L (ref 3.5–5.1)
Potassium: 3.6 mmol/L (ref 3.5–5.1)
Potassium: 4.3 mmol/L (ref 3.5–5.1)
SODIUM: 137 mmol/L (ref 135–145)
SODIUM: 137 mmol/L (ref 135–145)
SODIUM: 136 mmol/L (ref 135–145)
Sodium: 137 mmol/L (ref 135–145)
Sodium: 135 mmol/L (ref 135–145)
Sodium: 137 mmol/L (ref 135–145)
Sodium: 137 mmol/L (ref 135–145)
TCO2: 24 mmol/L (ref 0–100)
TCO2: 24 mmol/L (ref 0–100)
TCO2: 23 mmol/L (ref 0–100)
TCO2: 22 mmol/L (ref 0–100)
TCO2: 26 mmol/L (ref 0–100)
TCO2: 21 mmol/L (ref 0–100)
TCO2: 21 mmol/L (ref 0–100)

## 2014-05-28 LAB — HEMOGLOBIN AND HEMATOCRIT, BLOOD
HCT: 24.9 % — ABNORMAL LOW (ref 39.0–52.0)
Hemoglobin: 8.6 g/dL — ABNORMAL LOW (ref 13.0–17.0)

## 2014-05-28 LAB — POCT I-STAT 3, ART BLOOD GAS (G3+)
ACID-BASE EXCESS: 2 mmol/L (ref 0.0–2.0)
Acid-Base Excess: 2 mmol/L (ref 0.0–2.0)
Acid-base deficit: 1 mmol/L (ref 0.0–2.0)
Acid-base deficit: 2 mmol/L (ref 0.0–2.0)
Acid-base deficit: 2 mmol/L (ref 0.0–2.0)
BICARBONATE: 25.4 meq/L — AB (ref 20.0–24.0)
Bicarbonate: 28.4 mEq/L — ABNORMAL HIGH (ref 20.0–24.0)
Bicarbonate: 25.5 mEq/L — ABNORMAL HIGH (ref 20.0–24.0)
Bicarbonate: 23 mEq/L (ref 20.0–24.0)
Bicarbonate: 23.6 mEq/L (ref 20.0–24.0)
O2 SAT: 100 %
O2 SAT: 94 %
O2 SAT: 98 %
O2 Saturation: 100 %
O2 Saturation: 99 %
PCO2 ART: 50.7 mmHg — AB (ref 35.0–45.0)
PCO2 ART: 39.4 mmHg (ref 35.0–45.0)
PH ART: 7.355 (ref 7.350–7.450)
PH ART: 7.312 — AB (ref 7.350–7.450)
PH ART: 7.363 (ref 7.350–7.450)
Patient temperature: 36.6
Patient temperature: 37.5
Patient temperature: 37.6
TCO2: 30 mmol/L (ref 0–100)
TCO2: 27 mmol/L (ref 0–100)
TCO2: 27 mmol/L (ref 0–100)
TCO2: 24 mmol/L (ref 0–100)
TCO2: 25 mmol/L (ref 0–100)
pCO2 arterial: 35.9 mmHg (ref 35.0–45.0)
pCO2 arterial: 50 mmHg — ABNORMAL HIGH (ref 35.0–45.0)
pCO2 arterial: 41.6 mmHg (ref 35.0–45.0)
pH, Arterial: 7.46 — ABNORMAL HIGH (ref 7.350–7.450)
pH, Arterial: 7.378 (ref 7.350–7.450)
pO2, Arterial: 427 mmHg — ABNORMAL HIGH (ref 80.0–100.0)
pO2, Arterial: 453 mmHg — ABNORMAL HIGH (ref 80.0–100.0)
pO2, Arterial: 76 mmHg — ABNORMAL LOW (ref 80.0–100.0)
pO2, Arterial: 135 mmHg — ABNORMAL HIGH (ref 80.0–100.0)
pO2, Arterial: 112 mmHg — ABNORMAL HIGH (ref 80.0–100.0)

## 2014-05-28 LAB — HEPARIN LEVEL (UNFRACTIONATED): Heparin Unfractionated: 0.34 IU/mL (ref 0.30–0.70)

## 2014-05-28 LAB — CBC
HCT: 30.9 % — ABNORMAL LOW (ref 39.0–52.0)
HCT: 28.8 % — ABNORMAL LOW (ref 39.0–52.0)
HEMATOCRIT: 33.3 % — AB (ref 39.0–52.0)
HEMOGLOBIN: 10.7 g/dL — AB (ref 13.0–17.0)
Hemoglobin: 11.4 g/dL — ABNORMAL LOW (ref 13.0–17.0)
Hemoglobin: 10 g/dL — ABNORMAL LOW (ref 13.0–17.0)
MCH: 33 pg (ref 26.0–34.0)
MCH: 31.5 pg (ref 26.0–34.0)
MCH: 31.3 pg (ref 26.0–34.0)
MCHC: 34.2 g/dL (ref 30.0–36.0)
MCHC: 34.6 g/dL (ref 30.0–36.0)
MCHC: 34.7 g/dL (ref 30.0–36.0)
MCV: 96.5 fL (ref 78.0–100.0)
MCV: 90.9 fL (ref 78.0–100.0)
MCV: 90.3 fL (ref 78.0–100.0)
Platelets: 183 10*3/uL (ref 150–400)
Platelets: 103 10*3/uL — ABNORMAL LOW (ref 150–400)
Platelets: 104 10*3/uL — ABNORMAL LOW (ref 150–400)
RBC: 3.45 MIL/uL — ABNORMAL LOW (ref 4.22–5.81)
RBC: 3.4 MIL/uL — AB (ref 4.22–5.81)
RBC: 3.19 MIL/uL — ABNORMAL LOW (ref 4.22–5.81)
RDW: 13.6 % (ref 11.5–15.5)
RDW: 16.6 % — ABNORMAL HIGH (ref 11.5–15.5)
RDW: 17.1 % — ABNORMAL HIGH (ref 11.5–15.5)
WBC: 3.9 10*3/uL — AB (ref 4.0–10.5)
WBC: 9.2 10*3/uL (ref 4.0–10.5)
WBC: 8.1 10*3/uL (ref 4.0–10.5)

## 2014-05-28 LAB — BASIC METABOLIC PANEL
Anion gap: 4 — ABNORMAL LOW (ref 5–15)
BUN: 9 mg/dL (ref 6–23)
CO2: 34 mmol/L — ABNORMAL HIGH (ref 19–32)
Calcium: 9.5 mg/dL (ref 8.4–10.5)
Chloride: 101 mmol/L (ref 96–112)
Creatinine, Ser: 1.08 mg/dL (ref 0.50–1.35)
GFR calc Af Amer: 76 mL/min — ABNORMAL LOW (ref >90)
GFR calc non Af Amer: 65 mL/min — ABNORMAL LOW (ref >90)
Glucose, Bld: 103 mg/dL — ABNORMAL HIGH (ref 70–99)
Potassium: 4.3 mmol/L (ref 3.5–5.1)
Sodium: 139 mmol/L (ref 135–145)

## 2014-05-28 LAB — PROTIME-INR
INR: 1.3 (ref 0.00–1.49)
PROTHROMBIN TIME: 16.4 s — AB (ref 11.6–15.2)

## 2014-05-28 LAB — CREATININE, SERUM
Creatinine, Ser: 0.81 mg/dL (ref 0.50–1.35)
GFR calc Af Amer: >90 mL/min (ref >90)
GFR calc non Af Amer: 85 mL/min — ABNORMAL LOW (ref >90)

## 2014-05-28 LAB — POCT I-STAT 4, (NA,K, GLUC, HGB,HCT)
Glucose, Bld: 125 mg/dL — ABNORMAL HIGH (ref 70–99)
HCT: 32 % — ABNORMAL LOW (ref 39.0–52.0)
Hemoglobin: 10.9 g/dL — ABNORMAL LOW (ref 13.0–17.0)
Potassium: 3.7 mmol/L (ref 3.5–5.1)
Sodium: 139 mmol/L (ref 135–145)

## 2014-05-28 LAB — PLATELET COUNT: Platelets: 100 10*3/uL — ABNORMAL LOW (ref 150–400)

## 2014-05-28 LAB — APTT: APTT: 32 s (ref 24–37)

## 2014-05-28 LAB — MAGNESIUM: Magnesium: 2.8 mg/dL — ABNORMAL HIGH (ref 1.5–2.5)

## 2014-05-28 SURGERY — REDO CORONARY ARTERY BYPASS GRAFTING (CABG)
Anesthesia: General | Site: Chest

## 2014-05-28 MED ORDER — SODIUM CHLORIDE 0.45 % IV SOLN
INTRAVENOUS | Status: DC
Start: 2014-05-28 — End: 2014-05-29
  Administered 2014-05-28: 16:00:00 via INTRAVENOUS

## 2014-05-28 MED ORDER — SODIUM CHLORIDE 0.9 % IV SOLN
INTRAVENOUS | Status: DC
  Administered 2014-05-28: 2.5 [IU]/h via INTRAVENOUS
  Administered 2014-05-28: 2.6 [IU]/h via INTRAVENOUS
  Filled 2014-05-28 (×2): qty 2.5

## 2014-05-28 MED ORDER — DOPAMINE-DEXTROSE 3.2-5 MG/ML-% IV SOLN
3.0000 ug/kg/min | INTRAVENOUS | Status: DC
  Administered 2014-05-28: 5 ug/kg/min via INTRAVENOUS

## 2014-05-28 MED ORDER — PANTOPRAZOLE SODIUM 40 MG PO TBEC
40.0000 mg | DELAYED_RELEASE_TABLET | Freq: Every day | ORAL | Status: DC
  Administered 2014-05-30: 40 mg via ORAL
  Filled 2014-05-28: qty 1

## 2014-05-28 MED ORDER — MIDAZOLAM HCL 2 MG/2ML IJ SOLN: 2.0000 mg | INTRAMUSCULAR | Status: DC | PRN

## 2014-05-28 MED ORDER — SODIUM CHLORIDE 0.9 % IR SOLN
Status: DC | PRN
  Administered 2014-05-28: 1

## 2014-05-28 MED ORDER — PHENYLEPHRINE HCL 10 MG/ML IJ SOLN
0.0000 ug/min | INTRAVENOUS | Status: DC
  Administered 2014-05-28 (×2): 10 ug/min via INTRAVENOUS
  Filled 2014-05-28 (×2): qty 2

## 2014-05-28 MED ORDER — ALBUMIN HUMAN 5 % IV SOLN
INTRAVENOUS | Status: DC | PRN
  Administered 2014-05-28: 14:00:00 via INTRAVENOUS

## 2014-05-28 MED ORDER — PHENYLEPHRINE HCL 10 MG/ML IJ SOLN
10.0000 mg | INTRAVENOUS | Status: DC | PRN
  Administered 2014-05-28: 20 ug/min via INTRAVENOUS

## 2014-05-28 MED ORDER — NITROGLYCERIN IN D5W 200-5 MCG/ML-% IV SOLN: 0.0000 ug/min | INTRAVENOUS | Status: DC

## 2014-05-28 MED ORDER — FAMOTIDINE IN NACL 20-0.9 MG/50ML-% IV SOLN
20.0000 mg | Freq: Two times a day (BID) | INTRAVENOUS | Status: AC
  Administered 2014-05-28 – 2014-05-29 (×2): 20 mg via INTRAVENOUS
  Filled 2014-05-28: qty 50

## 2014-05-28 MED ORDER — SODIUM CHLORIDE 0.9 % IV SOLN
INTRAVENOUS | Status: DC
  Administered 2014-05-28: 16:00:00 via INTRAVENOUS

## 2014-05-28 MED ORDER — OXYCODONE HCL 5 MG PO TABS
5.0000 mg | ORAL_TABLET | ORAL | Status: DC | PRN
Start: 2014-05-28 — End: 2014-05-30

## 2014-05-28 MED ORDER — MILRINONE LOAD VIA INFUSION
INTRAVENOUS | Status: DC | PRN
  Administered 2014-05-28: 3500 ug via INTRAVENOUS

## 2014-05-28 MED ORDER — HEPARIN SODIUM (PORCINE) 1000 UNIT/ML IJ SOLN
INTRAMUSCULAR | Status: AC
  Filled 2014-05-28: qty 1

## 2014-05-28 MED ORDER — SODIUM CHLORIDE 0.9 % IJ SOLN
3.0000 mL | Freq: Two times a day (BID) | INTRAMUSCULAR | Status: DC
  Administered 2014-05-29 – 2014-05-30 (×3): 3 mL via INTRAVENOUS

## 2014-05-28 MED ORDER — DOPAMINE-DEXTROSE 1.6-5 MG/ML-% IV SOLN
INTRAVENOUS | Status: DC | PRN
  Administered 2014-05-28: 3 ug/kg/min via INTRAVENOUS

## 2014-05-28 MED ORDER — LACTATED RINGERS IV SOLN
INTRAVENOUS | Status: DC | PRN
  Administered 2014-05-28 (×2): via INTRAVENOUS

## 2014-05-28 MED ORDER — DOCUSATE SODIUM 100 MG PO CAPS
200.0000 mg | ORAL_CAPSULE | Freq: Every day | ORAL | Status: DC
  Administered 2014-05-29 – 2014-05-30 (×2): 200 mg via ORAL
  Filled 2014-05-28 (×2): qty 2

## 2014-05-28 MED ORDER — MORPHINE SULFATE 2 MG/ML IJ SOLN
1.0000 mg | INTRAMUSCULAR | Status: AC | PRN
  Filled 2014-05-28: qty 1

## 2014-05-28 MED ORDER — INSULIN REGULAR BOLUS VIA INFUSION
0.0000 [IU] | Freq: Three times a day (TID) | INTRAVENOUS | Status: DC
  Filled 2014-05-28: qty 10

## 2014-05-28 MED ORDER — LACTATED RINGERS IV SOLN: 500.0000 mL | Freq: Once | INTRAVENOUS | Status: AC | PRN

## 2014-05-28 MED ORDER — PROTAMINE SULFATE 10 MG/ML IV SOLN
INTRAVENOUS | Status: AC
  Filled 2014-05-28: qty 25

## 2014-05-28 MED ORDER — FENTANYL CITRATE 0.05 MG/ML IJ SOLN
INTRAMUSCULAR | Status: AC
  Filled 2014-05-28: qty 5

## 2014-05-28 MED ORDER — FENTANYL CITRATE 0.05 MG/ML IJ SOLN
INTRAMUSCULAR | Status: DC | PRN
  Administered 2014-05-28: 50 ug via INTRAVENOUS
  Administered 2014-05-28: 250 ug via INTRAVENOUS
  Administered 2014-05-28: 200 ug via INTRAVENOUS
  Administered 2014-05-28: 150 ug via INTRAVENOUS
  Administered 2014-05-28: 200 ug via INTRAVENOUS
  Administered 2014-05-28: 100 ug via INTRAVENOUS
  Administered 2014-05-28: 50 ug via INTRAVENOUS

## 2014-05-28 MED ORDER — PHENYLEPHRINE HCL 10 MG/ML IJ SOLN
INTRAMUSCULAR | Status: AC
  Filled 2014-05-28: qty 1

## 2014-05-28 MED ORDER — MILRINONE IN DEXTROSE 20 MG/100ML IV SOLN
0.1250 ug/kg/min | INTRAVENOUS | Status: AC
  Administered 2014-05-28: .3 ug/kg/min via INTRAVENOUS
  Filled 2014-05-28: qty 100

## 2014-05-28 MED ORDER — ASPIRIN 81 MG PO CHEW: 324.0000 mg | CHEWABLE_TABLET | Freq: Every day | ORAL | Status: DC

## 2014-05-28 MED ORDER — PHENYLEPHRINE HCL 10 MG/ML IJ SOLN
INTRAMUSCULAR | Status: DC | PRN
  Administered 2014-05-28: 80 ug via INTRAVENOUS
  Administered 2014-05-28: 40 ug via INTRAVENOUS
  Administered 2014-05-28: 80 ug via INTRAVENOUS

## 2014-05-28 MED ORDER — ACETAMINOPHEN 160 MG/5ML PO SOLN: 1000.0000 mg | Freq: Four times a day (QID) | ORAL | Status: DC

## 2014-05-28 MED ORDER — CETYLPYRIDINIUM CHLORIDE 0.05 % MT LIQD
7.0000 mL | Freq: Four times a day (QID) | OROMUCOSAL | Status: DC
  Administered 2014-05-29 (×3): 7 mL via OROMUCOSAL

## 2014-05-28 MED ORDER — BISACODYL 10 MG RE SUPP: 10.0000 mg | Freq: Every day | RECTAL | Status: DC

## 2014-05-28 MED ORDER — ROCURONIUM BROMIDE 50 MG/5ML IV SOLN
INTRAVENOUS | Status: AC
  Filled 2014-05-28: qty 1

## 2014-05-28 MED ORDER — HEPARIN SODIUM (PORCINE) 1000 UNIT/ML IJ SOLN
INTRAMUSCULAR | Status: DC | PRN
  Administered 2014-05-28: 24000 [IU] via INTRAVENOUS

## 2014-05-28 MED ORDER — PROPOFOL 10 MG/ML IV BOLUS
INTRAVENOUS | Status: DC | PRN
  Administered 2014-05-28: 30 mg via INTRAVENOUS
  Administered 2014-05-28: 40 mg via INTRAVENOUS

## 2014-05-28 MED ORDER — PROPOFOL 10 MG/ML IV BOLUS
INTRAVENOUS | Status: AC
  Filled 2014-05-28: qty 20

## 2014-05-28 MED ORDER — PROTAMINE SULFATE 10 MG/ML IV SOLN
INTRAVENOUS | Status: DC | PRN
  Administered 2014-05-28: 40 mg via INTRAVENOUS
  Administered 2014-05-28: 50 mg via INTRAVENOUS
  Administered 2014-05-28: 30 mg via INTRAVENOUS
  Administered 2014-05-28: 20 mg via INTRAVENOUS
  Administered 2014-05-28: 50 mg via INTRAVENOUS

## 2014-05-28 MED ORDER — METOPROLOL TARTRATE 12.5 MG HALF TABLET
12.5000 mg | ORAL_TABLET | Freq: Two times a day (BID) | ORAL | Status: DC
  Administered 2014-05-29 (×2): 12.5 mg via ORAL
  Filled 2014-05-28 (×5): qty 1

## 2014-05-28 MED ORDER — DEXTROSE 5 % IV SOLN
1.5000 g | Freq: Two times a day (BID) | INTRAVENOUS | Status: AC
  Administered 2014-05-28 – 2014-05-30 (×4): 1.5 g via INTRAVENOUS
  Filled 2014-05-28 (×5): qty 1.5

## 2014-05-28 MED ORDER — TRAMADOL HCL 50 MG PO TABS
50.0000 mg | ORAL_TABLET | ORAL | Status: DC | PRN
Start: 2014-05-28 — End: 2014-05-30
  Administered 2014-05-29: 100 mg via ORAL
  Administered 2014-05-29: 50 mg via ORAL
  Administered 2014-05-29 – 2014-05-30 (×3): 100 mg via ORAL
  Filled 2014-05-28 (×2): qty 2
  Filled 2014-05-28: qty 1
  Filled 2014-05-28 (×2): qty 2

## 2014-05-28 MED ORDER — LIDOCAINE HCL (CARDIAC) 20 MG/ML IV SOLN: INTRAVENOUS | Status: DC | PRN

## 2014-05-28 MED ORDER — MORPHINE SULFATE 2 MG/ML IJ SOLN
2.0000 mg | INTRAMUSCULAR | Status: DC | PRN
  Administered 2014-05-28 – 2014-05-29 (×5): 2 mg via INTRAVENOUS
  Filled 2014-05-28 (×4): qty 1

## 2014-05-28 MED ORDER — SODIUM CHLORIDE 0.9 % IJ SOLN
OROMUCOSAL | Status: DC | PRN
  Administered 2014-05-28 (×2): 1 mL via TOPICAL

## 2014-05-28 MED ORDER — DEXMEDETOMIDINE HCL IN NACL 400 MCG/100ML IV SOLN
0.4000 ug/kg/h | INTRAVENOUS | Status: DC
  Filled 2014-05-28: qty 100

## 2014-05-28 MED ORDER — ALBUMIN HUMAN 5 % IV SOLN
250.0000 mL | INTRAVENOUS | Status: AC | PRN
  Administered 2014-05-28 (×3): 250 mL via INTRAVENOUS
  Filled 2014-05-28: qty 250

## 2014-05-28 MED ORDER — PHENYLEPHRINE HCL 10 MG/ML IJ SOLN
20.0000 mg | INTRAVENOUS | Status: DC | PRN
  Administered 2014-05-28: 25 ug/min via INTRAVENOUS

## 2014-05-28 MED ORDER — ACETAMINOPHEN 160 MG/5ML PO SOLN: 650.0000 mg | Freq: Once | ORAL | Status: AC

## 2014-05-28 MED ORDER — BISACODYL 5 MG PO TBEC
10.0000 mg | DELAYED_RELEASE_TABLET | Freq: Every day | ORAL | Status: DC
  Administered 2014-05-29 – 2014-05-30 (×2): 10 mg via ORAL
  Filled 2014-05-28 (×2): qty 2

## 2014-05-28 MED ORDER — ACETAMINOPHEN 650 MG RE SUPP
650.0000 mg | Freq: Once | RECTAL | Status: AC
  Administered 2014-05-28: 650 mg via RECTAL

## 2014-05-28 MED ORDER — SODIUM CHLORIDE 0.9 % IJ SOLN: 3.0000 mL | INTRAMUSCULAR | Status: DC | PRN

## 2014-05-28 MED ORDER — POTASSIUM CHLORIDE 10 MEQ/50ML IV SOLN
10.0000 meq | INTRAVENOUS | Status: AC
Start: 2014-05-28 — End: 2014-05-28
  Administered 2014-05-28 (×3): 10 meq via INTRAVENOUS

## 2014-05-28 MED ORDER — ONDANSETRON HCL 4 MG/2ML IJ SOLN
4.0000 mg | Freq: Four times a day (QID) | INTRAMUSCULAR | Status: DC | PRN
  Administered 2014-05-29: 4 mg via INTRAVENOUS
  Filled 2014-05-28: qty 2

## 2014-05-28 MED ORDER — METOPROLOL TARTRATE 25 MG/10 ML ORAL SUSPENSION
12.5000 mg | Freq: Two times a day (BID) | ORAL | Status: DC
  Administered 2014-05-30: 12.5 mg
  Filled 2014-05-28 (×5): qty 5

## 2014-05-28 MED ORDER — SUCCINYLCHOLINE CHLORIDE 20 MG/ML IJ SOLN
INTRAMUSCULAR | Status: AC
  Filled 2014-05-28: qty 1

## 2014-05-28 MED ORDER — METOPROLOL TARTRATE 1 MG/ML IV SOLN: 2.5000 mg | INTRAVENOUS | Status: DC | PRN

## 2014-05-28 MED ORDER — ACETAMINOPHEN 500 MG PO TABS
1000.0000 mg | ORAL_TABLET | Freq: Four times a day (QID) | ORAL | Status: DC
  Administered 2014-05-29 – 2014-05-30 (×6): 1000 mg via ORAL
  Filled 2014-05-28 (×10): qty 2

## 2014-05-28 MED ORDER — MIDAZOLAM HCL 5 MG/5ML IJ SOLN
INTRAMUSCULAR | Status: DC | PRN
  Administered 2014-05-28: 2 mg via INTRAVENOUS
  Administered 2014-05-28 (×2): 3 mg via INTRAVENOUS
  Administered 2014-05-28 (×2): 2 mg via INTRAVENOUS

## 2014-05-28 MED ORDER — HEMOSTATIC AGENTS (NO CHARGE) OPTIME
TOPICAL | Status: DC | PRN
  Administered 2014-05-28: 1 via TOPICAL

## 2014-05-28 MED ORDER — SODIUM CHLORIDE 0.9 % IV SOLN
INTRAVENOUS | Status: DC
  Filled 2014-05-28: qty 40

## 2014-05-28 MED ORDER — MAGNESIUM SULFATE 4 GM/100ML IV SOLN
4.0000 g | Freq: Once | INTRAVENOUS | Status: AC
  Administered 2014-05-28: 4 g via INTRAVENOUS
  Filled 2014-05-28: qty 100

## 2014-05-28 MED ORDER — LIDOCAINE HCL (CARDIAC) 20 MG/ML IV SOLN
INTRAVENOUS | Status: AC
  Filled 2014-05-28: qty 5

## 2014-05-28 MED ORDER — SODIUM CHLORIDE 0.9 % IV SOLN
250.0000 mL | INTRAVENOUS | Status: DC
  Administered 2014-05-29: 250 mL via INTRAVENOUS

## 2014-05-28 MED ORDER — MIDAZOLAM HCL 10 MG/2ML IJ SOLN
INTRAMUSCULAR | Status: AC
  Filled 2014-05-28: qty 2

## 2014-05-28 MED ORDER — CHLORHEXIDINE GLUCONATE 0.12 % MT SOLN
15.0000 mL | Freq: Two times a day (BID) | OROMUCOSAL | Status: DC
  Administered 2014-05-28 – 2014-05-29 (×2): 15 mL via OROMUCOSAL
  Filled 2014-05-28: qty 15

## 2014-05-28 MED ORDER — LACTATED RINGERS IV SOLN
INTRAVENOUS | Status: DC
  Administered 2014-05-28: 16:00:00 via INTRAVENOUS

## 2014-05-28 MED ORDER — ALBUTEROL SULFATE HFA 108 (90 BASE) MCG/ACT IN AERS
INHALATION_SPRAY | RESPIRATORY_TRACT | Status: DC | PRN
  Administered 2014-05-28: 2 via RESPIRATORY_TRACT

## 2014-05-28 MED ORDER — ROCURONIUM BROMIDE 100 MG/10ML IV SOLN
INTRAVENOUS | Status: DC | PRN
  Administered 2014-05-28 (×2): 30 mg via INTRAVENOUS
  Administered 2014-05-28 (×3): 20 mg via INTRAVENOUS
  Administered 2014-05-28: 50 mg via INTRAVENOUS
  Administered 2014-05-28: 20 mg via INTRAVENOUS

## 2014-05-28 MED ORDER — MIDAZOLAM HCL 2 MG/2ML IJ SOLN
INTRAMUSCULAR | Status: AC
  Filled 2014-05-28: qty 2

## 2014-05-28 MED ORDER — VANCOMYCIN HCL IN DEXTROSE 1-5 GM/200ML-% IV SOLN
1000.0000 mg | Freq: Once | INTRAVENOUS | Status: AC
  Administered 2014-05-28: 1000 mg via INTRAVENOUS
  Filled 2014-05-28: qty 200

## 2014-05-28 MED ORDER — DEXMEDETOMIDINE HCL IN NACL 200 MCG/50ML IV SOLN
0.0000 ug/kg/h | INTRAVENOUS | Status: DC
  Administered 2014-05-28: 0.7 ug/kg/h via INTRAVENOUS

## 2014-05-28 MED ORDER — ASPIRIN EC 325 MG PO TBEC
325.0000 mg | DELAYED_RELEASE_TABLET | Freq: Every day | ORAL | Status: DC
  Administered 2014-05-29 – 2014-05-30 (×2): 325 mg via ORAL
  Filled 2014-05-28 (×2): qty 1

## 2014-05-28 SURGICAL SUPPLY — 112 items
ADAPTER CARDIO PERF ANTE/RETRO (ADAPTER) ×3 IMPLANT
ADPR PRFSN 84XANTGRD RTRGD (ADAPTER) ×2
APL SKNCLS STERI-STRIP NONHPOA (GAUZE/BANDAGES/DRESSINGS) ×2
APPLIER CLIP 9.375 MED OPEN (MISCELLANEOUS)
APPLIER CLIP 9.375 SM OPEN (CLIP)
APR CLP MED 9.3 20 MLT OPN (MISCELLANEOUS)
APR CLP SM 9.3 20 MLT OPN (CLIP)
ATTRACTOMAT 16X20 MAGNETIC DRP (DRAPES) ×3 IMPLANT
BAG DECANTER FOR FLEXI CONT (MISCELLANEOUS) ×3 IMPLANT
BANDAGE ELASTIC 4 VELCRO ST LF (GAUZE/BANDAGES/DRESSINGS) ×3 IMPLANT
BANDAGE ELASTIC 6 VELCRO ST LF (GAUZE/BANDAGES/DRESSINGS) ×3 IMPLANT
BENZOIN TINCTURE PRP APPL 2/3 (GAUZE/BANDAGES/DRESSINGS) ×2 IMPLANT
BLADE CORE FAN STRYKER (BLADE) ×6 IMPLANT
BLADE OSCILLATING /SAGITTAL (BLADE) ×3 IMPLANT
BLADE STERNUM SYSTEM 6 (BLADE) ×3 IMPLANT
BLADE SURG 11 STRL SS (BLADE) ×2 IMPLANT
BLADE SURG ROTATE 9660 (MISCELLANEOUS) IMPLANT
BNDG GAUZE ELAST 4 BULKY (GAUZE/BANDAGES/DRESSINGS) ×3 IMPLANT
CANISTER SUCTION 2500CC (MISCELLANEOUS) ×3 IMPLANT
CANN PRFSN .5XCNCT 15X34-48 (MISCELLANEOUS) ×2
CANNULA GUNDRY RCSP 15FR (MISCELLANEOUS) ×2 IMPLANT
CANNULA PRFSN .5XCNCT 15X34-48 (MISCELLANEOUS) ×2 IMPLANT
CANNULA VEN 2 STAGE (MISCELLANEOUS) ×4 IMPLANT
CANNULA VESSEL W/WING WO/VALVE (CANNULA) IMPLANT
CARDIAC SUCTION (MISCELLANEOUS) ×3 IMPLANT
CATH CPB KIT GERHARDT (MISCELLANEOUS) ×3 IMPLANT
CATH RETROPLEGIA CORONARY 14FR (CATHETERS) IMPLANT
CATH THORACIC 28FR (CATHETERS) ×3 IMPLANT
CLIP APPLIE 9.375 MED OPEN (MISCELLANEOUS) IMPLANT
CLIP APPLIE 9.375 SM OPEN (CLIP) IMPLANT
CLIP FOGARTY SPRING 6M (CLIP) ×1 IMPLANT
CLIP RETRACTION 3.0MM CORONARY (MISCELLANEOUS) ×1 IMPLANT
CLIP TI MEDIUM 24 (CLIP) IMPLANT
CLIP TI WIDE RED SMALL 24 (CLIP) ×3 IMPLANT
CONN Y 3/8X3/8X3/8  BEN (MISCELLANEOUS)
CONN Y 3/8X3/8X3/8 BEN (MISCELLANEOUS) IMPLANT
COVER SURGICAL LIGHT HANDLE (MISCELLANEOUS) ×6 IMPLANT
CRADLE DONUT ADULT HEAD (MISCELLANEOUS) ×3 IMPLANT
DRAIN CHANNEL 28F RND 3/8 FF (WOUND CARE) ×3 IMPLANT
DRAIN CHANNEL 32F RND 10.7 FF (WOUND CARE) IMPLANT
DRAPE CARDIOVASCULAR INCISE (DRAPES) ×3
DRAPE SLUSH/WARMER DISC (DRAPES) IMPLANT
DRAPE SRG 135X102X78XABS (DRAPES) ×2 IMPLANT
DRSG AQUACEL AG ADV 3.5X14 (GAUZE/BANDAGES/DRESSINGS) ×5 IMPLANT
ELECT BLADE 4.0 EZ CLEAN MEGAD (MISCELLANEOUS) ×3
ELECT CAUTERY BLADE 6.4 (BLADE) ×3 IMPLANT
ELECT REM PT RETURN 9FT ADLT (ELECTROSURGICAL) ×6
ELECTRODE BLDE 4.0 EZ CLN MEGD (MISCELLANEOUS) ×2 IMPLANT
ELECTRODE REM PT RTRN 9FT ADLT (ELECTROSURGICAL) ×4 IMPLANT
GAUZE SPONGE 4X4 12PLY STRL (GAUZE/BANDAGES/DRESSINGS) ×6 IMPLANT
GLOVE BIO SURGEON STRL SZ 6.5 (GLOVE) ×9 IMPLANT
GLOVE BIOGEL M 6.5 STRL (GLOVE) ×4 IMPLANT
GLOVE BIOGEL M STER SZ 6 (GLOVE) ×4 IMPLANT
GLOVE BIOGEL PI IND STRL 6.5 (GLOVE) IMPLANT
GLOVE BIOGEL PI IND STRL 7.0 (GLOVE) IMPLANT
GLOVE BIOGEL PI INDICATOR 6.5 (GLOVE) ×3
GLOVE BIOGEL PI INDICATOR 7.0 (GLOVE) ×4
GOWN STRL REUS W/ TWL LRG LVL3 (GOWN DISPOSABLE) ×8 IMPLANT
GOWN STRL REUS W/TWL LRG LVL3 (GOWN DISPOSABLE) ×21
HEMOSTAT POWDER SURGIFOAM 1G (HEMOSTASIS) IMPLANT
HEMOSTAT SURGICEL 2X14 (HEMOSTASIS) IMPLANT
INSERT FOGARTY XLG (MISCELLANEOUS) IMPLANT
KIT BASIN OR (CUSTOM PROCEDURE TRAY) ×3 IMPLANT
KIT ROOM TURNOVER OR (KITS) ×3 IMPLANT
KIT SUCTION CATH 14FR (SUCTIONS) ×7 IMPLANT
KIT VASOVIEW W/TROCAR VH 2000 (KITS) ×3 IMPLANT
LEAD PACING MYOCARDI (MISCELLANEOUS) ×3 IMPLANT
MARKER GRAFT CORONARY BYPASS (MISCELLANEOUS) ×9 IMPLANT
NS IRRIG 1000ML POUR BTL (IV SOLUTION) ×12 IMPLANT
PACK OPEN HEART (CUSTOM PROCEDURE TRAY) ×3 IMPLANT
PAD ARMBOARD 7.5X6 YLW CONV (MISCELLANEOUS) ×6 IMPLANT
PAD DEFIB R2 (MISCELLANEOUS) IMPLANT
PAD ELECT DEFIB RADIOL ZOLL (MISCELLANEOUS) ×3 IMPLANT
PENCIL BUTTON HOLSTER BLD 10FT (ELECTRODE) ×3 IMPLANT
PUNCH AORTIC ROTATE 4.0MM (MISCELLANEOUS) IMPLANT
PUNCH AORTIC ROTATE 4.5MM 8IN (MISCELLANEOUS) IMPLANT
PUNCH AORTIC ROTATE 5MM 8IN (MISCELLANEOUS) IMPLANT
SET CARDIOPLEGIA MPS 5001102 (MISCELLANEOUS) ×2 IMPLANT
SOLUTION ANTI FOG 6CC (MISCELLANEOUS) ×3 IMPLANT
SPONGE GAUZE 4X4 12PLY STER LF (GAUZE/BANDAGES/DRESSINGS) ×4 IMPLANT
SPONGE LAP 18X18 X RAY DECT (DISPOSABLE) ×12 IMPLANT
STRIP CLOSURE SKIN 1/2X4 (GAUZE/BANDAGES/DRESSINGS) ×1 IMPLANT
SUT BONE WAX W31G (SUTURE) ×3 IMPLANT
SUT MNCRL AB 4-0 PS2 18 (SUTURE) ×3 IMPLANT
SUT PROLENE 3 0 SH 1 (SUTURE) ×3 IMPLANT
SUT PROLENE 4 0 RB 1 (SUTURE) ×3
SUT PROLENE 4 0 TF (SUTURE) ×6 IMPLANT
SUT PROLENE 4-0 RB1 18X2 ARM (SUTURE) IMPLANT
SUT PROLENE 5 0 C 1 36 (SUTURE) ×1 IMPLANT
SUT PROLENE 6 0 CC (SUTURE) ×8 IMPLANT
SUT PROLENE 7 0 BV 1 (SUTURE) ×1 IMPLANT
SUT PROLENE 7 0 BV1 MDA (SUTURE) ×5 IMPLANT
SUT PROLENE 8 0 BV175 6 (SUTURE) ×15 IMPLANT
SUT SILK 2 0 SH CR/8 (SUTURE) ×1 IMPLANT
SUT STEEL 6MS V (SUTURE) ×3 IMPLANT
SUT STEEL STERNAL CCS#1 18IN (SUTURE) IMPLANT
SUT STEEL SZ 6 DBL 3X14 BALL (SUTURE) ×4 IMPLANT
SUT VIC AB 1 CTX 18 (SUTURE) ×6 IMPLANT
SUT VIC AB 2-0 CT1 27 (SUTURE) ×3
SUT VIC AB 2-0 CT1 TAPERPNT 27 (SUTURE) IMPLANT
SUT VIC AB 2-0 CTX 27 (SUTURE) IMPLANT
SUT VIC AB 3-0 X1 27 (SUTURE) IMPLANT
SUTURE E-PAK OPEN HEART (SUTURE) ×3 IMPLANT
SYSTEM SAHARA CHEST DRAIN ATS (WOUND CARE) ×3 IMPLANT
TAPE CLOTH SURG 4X10 WHT LF (GAUZE/BANDAGES/DRESSINGS) ×6 IMPLANT
TOWEL OR 17X24 6PK STRL BLUE (TOWEL DISPOSABLE) ×3 IMPLANT
TOWEL OR 17X26 10 PK STRL BLUE (TOWEL DISPOSABLE) ×3 IMPLANT
TRAY FOLEY IC TEMP SENS 16FR (CATHETERS) ×3 IMPLANT
TUBE FEEDING 8FR 16IN STR KANG (MISCELLANEOUS) ×3 IMPLANT
TUBING INSUFFLATION (TUBING) ×3 IMPLANT
UNDERPAD 30X30 INCONTINENT (UNDERPADS AND DIAPERS) ×3 IMPLANT
WATER STERILE IRR 1000ML POUR (IV SOLUTION) ×6 IMPLANT

## 2014-05-28 NOTE — Progress Notes (Signed)
Patient ID: [name], male   DOB: 1940/07/22, 74 y.o.   MRN: [id number] EVENING ROUNDS NOTE :     [address]       [place] [address]             [phone]                 Day of Surgery Procedure(s) (LRB): REDO CORONARY ARTERY BYPASS GRAFTING (CABG) (N/A) TRANSESOPHAGEAL ECHOCARDIOGRAM (TEE) (N/A)  Total Length of Stay:  LOS: 7 days  BP 83/47 mmHg  Pulse 90  Temp(Src) 98.2 F (36.8 C) (Oral)  Resp 14  Ht 5\' 11"  (1.803 m)  Wt 152 lb 1.6 oz (68.992 kg)  BMI 21.22 kg/m2  SpO2 99%  .Intake/Output      02/18 0701 - 02/19 0700 02/19 0701 - 02/20 0700   P.O. 721    I.V. (mL/kg) 310.8 (4.5) 3327.6 (48.2)   Blood  401   IV Piggyback  900   Total Intake(mL/kg) 1031.8 (15) 4628.6 (67.1)   Urine (mL/kg/hr) 2300 (1.4) 1905 (2.5)   Stool 1 (0)    Blood  800 (1.1)   Chest Tube  80 (0.1)   Total Output 2301 2785   Net -1269.2 +1843.6          . sodium chloride 20 mL/hr at 05/28/14 1700  . [START ON 05/29/2014] sodium chloride    . sodium chloride 10 mL/hr at 05/28/14 1700  . dexmedetomidine 0.5 mcg/kg/hr (05/28/14 1700)  . DOPamine 5 mcg/kg/min (05/28/14 1700)  . insulin (NOVOLIN-R) infusion 1.7 Units/hr (05/28/14 1700)  . lactated ringers 20 mL/hr at 05/28/14 1700  . nitroGLYCERIN Stopped (05/28/14 1615)  . phenylephrine (NEO-SYNEPHRINE) Adult infusion 10 mcg/min (05/28/14 1700)     Lab Results  Component Value Date   WBC 9.2 05/28/2014   HGB 10.9* 05/28/2014   HCT 32.0* 05/28/2014   PLT 103* 05/28/2014   GLUCOSE 125* 05/28/2014   CHOL 146 11/19/2012   TRIG 93.0 11/19/2012   HDL 38.50* 11/19/2012   LDLCALC 89 11/19/2012   ALT 30 05/27/2014   AST 24 05/27/2014   NA 139 05/28/2014   K 3.7 05/28/2014   CL 99 05/28/2014   CREATININE 0.80 05/28/2014   BUN 7 05/28/2014   CO2 34* 05/28/2014   TSH 1.14 04/12/2011   PSA 0.01* 02/20/2012   INR 1.30 05/28/2014   HGBA1C 5.7 07/11/2006   Stable early post op Not bleeding starting to  wake up  [place] [phone] Office [phone] 05/28/2014 6:00 PM

## 2014-05-28 NOTE — Progress Notes (Deleted)
  Echocardiogram 2D Echocardiogram has been performed.  Darlina Sicilian M 05/28/2014, 12:41 PM

## 2014-05-28 NOTE — Progress Notes (Signed)
  Echocardiogram Echocardiogram Transesophageal has been performed.  Darlina Sicilian M 05/28/2014, 12:45 PM

## 2014-05-28 NOTE — Addendum Note (Signed)
Addendum  created 05/28/14 1648 by Maeola Harman, CRNA   Modules edited: Anesthesia Medication Administration

## 2014-05-28 NOTE — Transfer of Care (Signed)
Immediate Anesthesia Transfer of Care Note  Patient: [name]  Procedure(s) Performed: Procedure(s) with comments: REDO CORONARY ARTERY BYPASS GRAFTING (CABG) (N/A) - Times 4 using right internal mammary artery to Intermediate artery and endoscopically harvested left saphenous vein to LAD, OM1, and PD coronary arteries. TRANSESOPHAGEAL ECHOCARDIOGRAM (TEE) (N/A)  Patient Location: SICU  Anesthesia Type:General  Level of Consciousness: Patient remains intubated per anesthesia plan  Airway & Oxygen Therapy: Patient remains intubated per anesthesia plan  Post-op Assessment: Report given to RN and Post -op Vital signs reviewed and stable  Post vital signs: stable  Last Vitals:  Filed Vitals:   05/28/14 0532  BP: 121/50  Pulse: 67  Temp: 36.7 C  Resp: 18    Complications: No apparent anesthesia complications

## 2014-05-28 NOTE — Progress Notes (Signed)
Patient to OR, family at bedside took belongings. Prep completed, CCMD notified.

## 2014-05-28 NOTE — Anesthesia Procedure Notes (Addendum)
Procedure Name: Intubation Date/Time: 05/28/2014 7:57 AM Performed by: Maeola Harman Pre-anesthesia Checklist: Patient identified, Emergency Drugs available, Suction available, Patient being monitored and Timeout performed Patient Re-evaluated:Patient Re-evaluated prior to inductionOxygen Delivery Method: Circle system utilized Preoxygenation: Pre-oxygenation with 100% oxygen Intubation Type: IV induction Ventilation: Mask ventilation without difficulty Laryngoscope Size: Mac and 4 Grade View: Grade I Tube type: Oral Tube size: 8.0 mm Number of attempts: 1 Airway Equipment and Method: Stylet Placement Confirmation: ETT inserted through vocal cords under direct vision,  positive ETCO2 and breath sounds checked- equal and bilateral Secured at: 22 cm Tube secured with: Tape Dental Injury: Teeth and Oropharynx as per pre-operative assessment  Comments: Easy atraumatic induction and intubation with MAC 4 blade.  Dr. Ermalene Postin verified placement.  Nathan Session, CRNA

## 2014-05-28 NOTE — Progress Notes (Signed)
Utilization review completed.  

## 2014-05-28 NOTE — OR Nursing (Signed)
SICU First call @1420 

## 2014-05-28 NOTE — Anesthesia Postprocedure Evaluation (Signed)
  Anesthesia Post-op Note  Patient: [name]  Procedure(s) Performed: Procedure(s) with comments: REDO CORONARY ARTERY BYPASS GRAFTING (CABG) (N/A) - Times 4 using right internal mammary artery to Intermediate artery and endoscopically harvested left saphenous vein to LAD, OM1, and PD coronary arteries. TRANSESOPHAGEAL ECHOCARDIOGRAM (TEE) (N/A)  Patient Location: ICU  Anesthesia Type:General  Level of Consciousness: sedated  Airway and Oxygen Therapy: Patient remains intubated per anesthesia plan  Post-op Pain: none  Post-op Assessment: Post-op Vital signs reviewed, Patient's Cardiovascular Status Stable, Respiratory Function Stable and Patent Airway  Post-op Vital Signs: Reviewed and stable  Last Vitals:  Filed Vitals:   05/28/14 0532  BP: 121/50  Pulse: 67  Temp: 36.7 C  Resp: 18    Complications: No apparent anesthesia complications

## 2014-05-28 NOTE — Brief Op Note (Addendum)
      [address]       [place] [address]             [phone]      05/28/2014  1:15 PM  PATIENT:  Tamela Gammon Zielke  74 y.o. male  PRE-OPERATIVE DIAGNOSIS:  CAD  POST-OPERATIVE DIAGNOSIS:  CAD  PROCEDURE:   REDO CORONARY ARTERY BYPASS GRAFTING x 4 (RIMA-Int, SVG- disal LAD, SVG-OM, SVG-PD) ENDOSCOPIC VEIN HARVEST LEFT LEG  SURGEON:  Grace Isaac, MD  ASSISTANT: Suzzanne Cloud, PA-C  ANESTHESIA:   general  PATIENT CONDITION:  ICU - intubated and hemodynamically stable.  PRE-OPERATIVE WEIGHT: 68 kg

## 2014-05-28 NOTE — Anesthesia Preprocedure Evaluation (Addendum)
Anesthesia Evaluation  Patient identified by MRN, date of birth, ID band Patient awake    Reviewed: Allergy & Precautions, NPO status , Patient's Chart, lab work & pertinent test results, reviewed documented beta blocker date and time   Airway Mallampati: II  TM Distance: >3 FB     Dental  (+) Teeth Intact, Dental Advisory Given   Pulmonary Current Smoker,  breath sounds clear to auscultation        Cardiovascular + CAD, + Past MI and + Peripheral Vascular Disease + dysrhythmias Rhythm:Regular     Neuro/Psych Anxiety Depression  Neuromuscular disease    GI/Hepatic negative GI ROS, Neg liver ROS, GERD-  Medicated and Controlled,  Endo/Other  negative endocrine ROS  Renal/GU negative Renal ROS     Musculoskeletal  (+) Arthritis -,   Abdominal (+)  Abdomen: soft. Bowel sounds: normal.  Peds  Hematology   Anesthesia Other Findings   Reproductive/Obstetrics                           Anesthesia Physical Anesthesia Plan  ASA: IV  Anesthesia Plan: General   Post-op Pain Management:    Induction: Intravenous  Airway Management Planned: Oral ETT  Additional Equipment: Arterial line, CVP, PA Cath, TEE and Ultrasound Guidance Line Placement  Intra-op Plan:   Post-operative Plan: Post-operative intubation/ventilation  Informed Consent: I have reviewed the patients History and Physical, chart, labs and discussed the procedure including the risks, benefits and alternatives for the proposed anesthesia with the patient or authorized representative who has indicated his/her understanding and acceptance.   Dental advisory given  Plan Discussed with: CRNA and Surgeon  Anesthesia Plan Comments:         Anesthesia Quick Evaluation

## 2014-05-28 NOTE — Procedures (Signed)
Extubation Procedure Note  Patient Details:   Name: [name] DOB: 06/06/1940 MRN: [id number]   Airway Documentation:  Pre Extubation: Pt completed Rapid Wean Protocol without complication. ABG acceptable. Pt follows all commands. NIF -22, VC 1.1L. Cuff leak present.  Post Extubation: Pt extubated to 2L [place], Sats 97%. No Stridor. Pt coughs/clear secretions.Clear BBS. Pt able to state name/location.    Evaluation  O2 sats: stable throughout Complications: No apparent complications Patient did tolerate procedure well. Bilateral Breath Sounds: Clear, Diminished   Yes  Sharen Hint 05/28/2014, 11:17 PM

## 2014-05-29 ENCOUNTER — Inpatient Hospital Stay (HOSPITAL_COMMUNITY): Payer: Medicare Other

## 2014-05-29 LAB — POCT I-STAT 3, ART BLOOD GAS (G3+)
Acid-base deficit: 3 mmol/L — ABNORMAL HIGH (ref 0.0–2.0)
BICARBONATE: 22.6 meq/L (ref 20.0–24.0)
O2 Saturation: 96 %
Patient temperature: 37.3
TCO2: 24 mmol/L (ref 0–100)
pCO2 arterial: 40.7 mmHg (ref 35.0–45.0)
pH, Arterial: 7.354 (ref 7.350–7.450)
pO2, Arterial: 89 mmHg (ref 80.0–100.0)

## 2014-05-29 LAB — CBC
HCT: 24.7 % — ABNORMAL LOW (ref 39.0–52.0)
HCT: 22.9 % — ABNORMAL LOW (ref 39.0–52.0)
Hemoglobin: 8.5 g/dL — ABNORMAL LOW (ref 13.0–17.0)
Hemoglobin: 7.8 g/dL — ABNORMAL LOW (ref 13.0–17.0)
MCH: 31.4 pg (ref 26.0–34.0)
MCH: 32 pg (ref 26.0–34.0)
MCHC: 34.4 g/dL (ref 30.0–36.0)
MCHC: 34.1 g/dL (ref 30.0–36.0)
MCV: 91.1 fL (ref 78.0–100.0)
MCV: 93.9 fL (ref 78.0–100.0)
Platelets: 96 10*3/uL — ABNORMAL LOW (ref 150–400)
Platelets: 69 10*3/uL — ABNORMAL LOW (ref 150–400)
RBC: 2.71 MIL/uL — ABNORMAL LOW (ref 4.22–5.81)
RBC: 2.44 MIL/uL — AB (ref 4.22–5.81)
RDW: 17.3 % — ABNORMAL HIGH (ref 11.5–15.5)
RDW: 17.7 % — AB (ref 11.5–15.5)
WBC: 5.9 10*3/uL (ref 4.0–10.5)
WBC: 4 10*3/uL (ref 4.0–10.5)

## 2014-05-29 LAB — GLUCOSE, CAPILLARY
Glucose-Capillary: 102 mg/dL — ABNORMAL HIGH (ref 70–99)
Glucose-Capillary: 103 mg/dL — ABNORMAL HIGH (ref 70–99)
Glucose-Capillary: 117 mg/dL — ABNORMAL HIGH (ref 70–99)
Glucose-Capillary: 127 mg/dL — ABNORMAL HIGH (ref 70–99)
Glucose-Capillary: 143 mg/dL — ABNORMAL HIGH (ref 70–99)
Glucose-Capillary: 144 mg/dL — ABNORMAL HIGH (ref 70–99)
Glucose-Capillary: 78 mg/dL (ref 70–99)
Glucose-Capillary: 88 mg/dL (ref 70–99)

## 2014-05-29 LAB — MAGNESIUM
MAGNESIUM: 2.6 mg/dL — AB (ref 1.5–2.5)
Magnesium: 2.6 mg/dL — ABNORMAL HIGH (ref 1.5–2.5)

## 2014-05-29 LAB — BASIC METABOLIC PANEL
Anion gap: 4 — ABNORMAL LOW (ref 5–15)
BUN: 8 mg/dL (ref 6–23)
CHLORIDE: 108 mmol/L (ref 96–112)
CO2: 26 mmol/L (ref 19–32)
Calcium: 8 mg/dL — ABNORMAL LOW (ref 8.4–10.5)
Creatinine, Ser: 0.81 mg/dL (ref 0.50–1.35)
GFR calc Af Amer: >90 mL/min (ref >90)
GFR, EST NON AFRICAN AMERICAN: 85 mL/min — AB (ref >90)
Glucose, Bld: 106 mg/dL — ABNORMAL HIGH (ref 70–99)
Potassium: 4.1 mmol/L (ref 3.5–5.1)
Sodium: 138 mmol/L (ref 135–145)

## 2014-05-29 LAB — CREATININE, SERUM
Creatinine, Ser: 0.97 mg/dL (ref 0.50–1.35)
GFR calc Af Amer: >90 mL/min (ref >90)
GFR calc non Af Amer: 79 mL/min — ABNORMAL LOW (ref >90)

## 2014-05-29 MED ORDER — CETYLPYRIDINIUM CHLORIDE 0.05 % MT LIQD
7.0000 mL | Freq: Two times a day (BID) | OROMUCOSAL | Status: DC
  Administered 2014-05-29 – 2014-06-05 (×7): 7 mL via OROMUCOSAL

## 2014-05-29 MED ORDER — AMIODARONE IV BOLUS ONLY 150 MG/100ML
150.0000 mg | Freq: Once | INTRAVENOUS | Status: AC
  Administered 2014-05-29: 150 mg via INTRAVENOUS
  Filled 2014-05-29: qty 100

## 2014-05-29 MED ORDER — SODIUM CHLORIDE 0.9 % IJ SOLN: 10.0000 mL | INTRAMUSCULAR | Status: DC | PRN

## 2014-05-29 MED ORDER — SODIUM CHLORIDE 0.9 % IV SOLN
INTRAVENOUS | Status: DC
  Administered 2014-05-29: 20 mL/h via INTRAVENOUS

## 2014-05-29 MED ORDER — AMIODARONE HCL 200 MG PO TABS
300.0000 mg | ORAL_TABLET | Freq: Two times a day (BID) | ORAL | Status: DC
  Administered 2014-05-29 – 2014-06-04 (×14): 300 mg via ORAL
  Filled 2014-05-29 (×16): qty 1

## 2014-05-29 MED ORDER — INSULIN ASPART 100 UNIT/ML ~~LOC~~ SOLN
0.0000 [IU] | SUBCUTANEOUS | Status: DC
  Administered 2014-05-29: 2 [IU] via SUBCUTANEOUS

## 2014-05-29 MED ORDER — AMIODARONE HCL IN DEXTROSE 360-4.14 MG/200ML-% IV SOLN
INTRAVENOUS | Status: AC
  Filled 2014-05-29: qty 200

## 2014-05-29 MED ORDER — SODIUM CHLORIDE 0.9 % IJ SOLN
10.0000 mL | Freq: Two times a day (BID) | INTRAMUSCULAR | Status: DC
  Administered 2014-05-29: 10 mL

## 2014-05-29 NOTE — Plan of Care (Signed)
Problem: Phase II - Intermediate Post-Op Goal: Wean to Extubate Outcome: Completed/Met Date Met:  05/29/14 Pt extubated 05/28/14 at 2315.  Goal: Maintain Hemodynamic Stability Outcome: Progressing Pt on renal dose dopamine and neosynephrine.  Goal: CBGs/Blood Glucose per SCIP Criteria Outcome: Progressing Pt blood sugar maintained via insulin drip.  Goal: Pain controlled with appropriate interventions Outcome: Progressing Pt given IV morphine to address pain.  Goal: Advance Diet Outcome: Progressing Pt tolerating ice chips. Will advance to full liquid diet.  Goal: Activity Progressed Outcome: Progressing Pt dangled legs x2.

## 2014-05-29 NOTE — Progress Notes (Signed)
Patient ID: [name], male   DOB: Aug 18, 1940, 74 y.o.   MRN: [id number] EVENING ROUNDS NOTE :     [address]       [place] [address]             [phone]                 1 Day Post-Op Procedure(s) (LRB): REDO CORONARY ARTERY BYPASS GRAFTING (CABG) (N/A) TRANSESOPHAGEAL ECHOCARDIOGRAM (TEE) (N/A)  Total Length of Stay:  LOS: 8 days  BP 96/43 mmHg  Pulse 90  Temp(Src) 98.4 F (36.9 C) (Oral)  Resp 23  Ht 5\' 11"  (1.803 m)  Wt 161 lb 2.5 oz (73.1 kg)  BMI 22.49 kg/m2  SpO2 97%  .Intake/Output      02/20 0701 - 02/21 0700   P.O. 360   I.V. (mL/kg) 112.8 (1.5)   Blood    IV Piggyback 50   Total Intake(mL/kg) 522.8 (7.2)   Urine (mL/kg/hr) 700 (0.8)   Blood    Chest Tube 100 (0.1)   Total Output 800   Net -277.2         . sodium chloride 20 mL/hr (05/29/14 1819)  . nitroGLYCERIN Stopped (05/28/14 1615)  . phenylephrine (NEO-SYNEPHRINE) Adult infusion Stopped (05/29/14 0900)     Lab Results  Component Value Date   WBC 5.9 05/29/2014   HGB 8.5* 05/29/2014   HCT 24.7* 05/29/2014   PLT 96* 05/29/2014   GLUCOSE 106* 05/29/2014   CHOL 146 11/19/2012   TRIG 93.0 11/19/2012   HDL 38.50* 11/19/2012   LDLCALC 89 11/19/2012   ALT 30 05/27/2014   AST 24 05/27/2014   NA 138 05/29/2014   K 4.1 05/29/2014   CL 108 05/29/2014   CREATININE 0.81 05/29/2014   BUN 8 05/29/2014   CO2 26 05/29/2014   TSH 1.14 04/12/2011   PSA 0.01* 02/20/2012   INR 1.30 05/28/2014   HGBA1C 5.7 07/11/2006   Back in sinus now,  Was in afib early today   Grace Isaac MD  Beeper [phone] Office [phone] 05/29/2014 7:30 PM

## 2014-05-29 NOTE — Progress Notes (Addendum)
T[facility] DAILY ICU PROGRESS NOTE                   [address]            [place] [address]          [phone]   1 Day Post-Op Procedure(s) (LRB): REDO CORONARY ARTERY BYPASS GRAFTING (CABG) (N/A) TRANSESOPHAGEAL ECHOCARDIOGRAM (TEE) (N/A)  Total Length of Stay:  LOS: 8 days   Subjective: Sore, otherwise stable.   Objective: Vital signs in last 24 hours: Temp:  [97.9 F (36.6 C)-99.3 F (37.4 C)] 99.1 F (37.3 C) (02/20 0740) Pulse Rate:  [80-94] 86 (02/20 0740) Cardiac Rhythm:  [-] Atrial paced (02/20 0800) Resp:  [10-30] 25 (02/20 0740) BP: (83-130)/(44-61) 116/55 mmHg (02/20 0700) SpO2:  [89 %-100 %] 97 % (02/20 0740) Arterial Line BP: (93-151)/(38-75) 117/50 mmHg (02/20 0740) FiO2 (%):  [40 %-50 %] 40 % (02/19 2235) Weight:  [161 lb 2.5 oz (73.1 kg)] 161 lb 2.5 oz (73.1 kg) (02/20 0640)  Filed Weights   05/27/14 0412 05/28/14 0532 05/29/14 0640  Weight: 153 lb (69.4 kg) 152 lb 1.6 oz (68.992 kg) 161 lb 2.5 oz (73.1 kg)    Weight change: 9 lb 0.9 oz (4.108 kg)   Hemodynamic parameters for last 24 hours: PAP: (16-60)/(4-50) 29/19 mmHg CO:  [3.7 L/min-6.1 L/min] 5.7 L/min CI:  [2 L/min/m2-3.2 L/min/m2] 3 L/min/m2  Intake/Output from previous day: 02/19 0701 - 02/20 0700 In: 6037.2 [I.V.:4336.2; Blood:401; IV Piggyback:1300] Out: 9379 [Urine:3545; Blood:800; Chest Tube:315]  Intake/Output this shift: Total I/O In: -  Out: 345 [Urine:275; Chest Tube:70]  Current Meds: Scheduled Meds: . acetaminophen  1,000 mg Oral 4 times per day   Or  . acetaminophen (TYLENOL) oral liquid 160 mg/5 mL  1,000 mg Per Tube 4 times per day  . antiseptic oral rinse  7 mL Mouth Rinse QID  . aspirin EC  325 mg Oral Daily   Or  . aspirin  324 mg Per Tube Daily  . atorvastatin  40 mg Oral q1800  . bisacodyl  10 mg Oral Daily   Or  . bisacodyl  10 mg Rectal Daily  . cefUROXime (ZINACEF)  IV  1.5 g Intravenous Q12H  . chlorhexidine  15 mL Mouth Rinse BID  .  docusate sodium  200 mg Oral Daily  . insulin regular  0-10 Units Intravenous TID WC  . metoprolol tartrate  12.5 mg Oral BID   Or  . metoprolol tartrate  12.5 mg Per Tube BID  . [START ON 05/30/2014] pantoprazole  40 mg Oral Daily  . sodium chloride  3 mL Intravenous Q12H   Continuous Infusions: . sodium chloride 20 mL/hr at 05/29/14 0700  . sodium chloride 250 mL (05/29/14 0700)  . sodium chloride 10 mL/hr at 05/29/14 0700  . dexmedetomidine Stopped (05/28/14 1849)  . DOPamine 3 mcg/kg/min (05/29/14 0700)  . insulin (NOVOLIN-R) infusion 0.7 Units/hr (05/29/14 1000)  . lactated ringers 20 mL/hr at 05/29/14 0700  . nitroGLYCERIN Stopped (05/28/14 1615)  . phenylephrine (NEO-SYNEPHRINE) Adult infusion 5.067 mcg/min (05/29/14 0700)   PRN Meds:.albumin human, metoprolol, morphine injection, ondansetron (ZOFRAN) IV, oxyCODONE, sodium chloride, traMADol  CBGs [id number]  Physical Exam: General appearance: alert, cooperative and no distress Heart: paced at 80, sinus rhythm in 70s with pacer off Lungs: diminished breath sounds bilaterally Extremities: Mild bilateral LE edema Wound: Dressed and dry   Lab Results: CBC: Recent Labs  05/28/14 2217 05/29/14 0431  WBC  8.1 5.9  HGB 10.0* 8.5*  HCT 28.8* 24.7*  PLT 104* 96*   BMET:  Recent Labs  05/28/14 0540  05/28/14 2200 05/28/14 2217 05/29/14 0431  NA 139  < > 137  --  138  K 4.3  < > 4.3  --  4.1  CL 101  < > 104  --  108  CO2 34*  --   --   --  26  GLUCOSE 103*  < > 157*  --  106*  BUN 9  < > 8  --  8  CREATININE 1.08  < > 0.80 0.81 0.81  CALCIUM 9.5  --   --   --  8.0*  < > = values in this interval not displayed.  PT/INR:  Recent Labs  05/28/14 1500  LABPROT 16.4*  INR 1.30   Radiology:  FINDINGS: There is a right chest tube appearing unchanged in position. There is a right jugular Swan-Ganz catheter extending well out the right lower lobe artery. Mediastinal drain noted. Endotracheal tube  and nasogastric tube have been removed. Left chest tube has been removed. No pneumothorax is evident. Mild atelectatic appearing basilar opacities have partially cleared.  IMPRESSION: Removal of the endotracheal tube, nasogastric tube and left chest tube. Swan-Ganz catheter extends far out the right lower lobe pulmonary artery. Partial clearance of basilar opacities.    Assessment/Plan: S/P Procedure(s) (LRB): REDO CORONARY ARTERY BYPASS GRAFTING (CABG) (N/A) TRANSESOPHAGEAL ECHOCARDIOGRAM (TEE) (N/A)  CV- SR under pacer. BPs low normal. Wean drips as tolerated.  Pulm- atelectasis, increase pulm toilet.  Postop thrombocytopenia- plts down some this am, 96K. Will watch.  Expected postop blood loss anemia- H/H down slightly.  Continue to monitor.  Mobilize, d/c lines and tubes as able, routine POD 1 progression.   COLLINS,GINA H 05/29/2014 11:01 AM  Now back in afib, will re bolus cordarone and resume po dose  D/c dopamine I have seen and examined [name] and agree with the above assessment  and plan.  Grace Isaac MD Beeper [phone] Office [phone] 05/29/2014 12:20 PM

## 2014-05-30 ENCOUNTER — Inpatient Hospital Stay (HOSPITAL_COMMUNITY): Payer: Medicare Other

## 2014-05-30 LAB — POCT I-STAT, CHEM 8
BUN: 11 mg/dL (ref 6–23)
CALCIUM ION: 1.16 mmol/L (ref 1.13–1.30)
CHLORIDE: 100 mmol/L (ref 96–112)
Creatinine, Ser: 0.9 mg/dL (ref 0.50–1.35)
Glucose, Bld: 113 mg/dL — ABNORMAL HIGH (ref 70–99)
HCT: 23 % — ABNORMAL LOW (ref 39.0–52.0)
Hemoglobin: 7.8 g/dL — ABNORMAL LOW (ref 13.0–17.0)
Potassium: 4.3 mmol/L (ref 3.5–5.1)
Sodium: 137 mmol/L (ref 135–145)
TCO2: 22 mmol/L (ref 0–100)

## 2014-05-30 LAB — BASIC METABOLIC PANEL
Anion gap: 3 — ABNORMAL LOW (ref 5–15)
BUN: 13 mg/dL (ref 6–23)
CO2: 27 mmol/L (ref 19–32)
Calcium: 8.1 mg/dL — ABNORMAL LOW (ref 8.4–10.5)
Chloride: 105 mmol/L (ref 96–112)
Creatinine, Ser: 0.91 mg/dL (ref 0.50–1.35)
GFR calc Af Amer: >90 mL/min (ref >90)
GFR calc non Af Amer: 81 mL/min — ABNORMAL LOW (ref >90)
Glucose, Bld: 112 mg/dL — ABNORMAL HIGH (ref 70–99)
Potassium: 4.3 mmol/L (ref 3.5–5.1)
Sodium: 135 mmol/L (ref 135–145)

## 2014-05-30 LAB — CBC
HCT: 22.7 % — ABNORMAL LOW (ref 39.0–52.0)
Hemoglobin: 7.6 g/dL — ABNORMAL LOW (ref 13.0–17.0)
MCH: 31.1 pg (ref 26.0–34.0)
MCHC: 33.5 g/dL (ref 30.0–36.0)
MCV: 93 fL (ref 78.0–100.0)
Platelets: 71 10*3/uL — ABNORMAL LOW (ref 150–400)
RBC: 2.44 MIL/uL — ABNORMAL LOW (ref 4.22–5.81)
RDW: 17.2 % — ABNORMAL HIGH (ref 11.5–15.5)
WBC: 4.9 10*3/uL (ref 4.0–10.5)

## 2014-05-30 LAB — GLUCOSE, CAPILLARY
GLUCOSE-CAPILLARY: 96 mg/dL (ref 70–99)
GLUCOSE-CAPILLARY: 105 mg/dL — AB (ref 70–99)
GLUCOSE-CAPILLARY: 105 mg/dL — AB (ref 70–99)
GLUCOSE-CAPILLARY: 116 mg/dL — AB (ref 70–99)
GLUCOSE-CAPILLARY: 106 mg/dL — AB (ref 70–99)
Glucose-Capillary: 102 mg/dL — ABNORMAL HIGH (ref 70–99)
Glucose-Capillary: 103 mg/dL — ABNORMAL HIGH (ref 70–99)
Glucose-Capillary: 104 mg/dL — ABNORMAL HIGH (ref 70–99)
Glucose-Capillary: 110 mg/dL — ABNORMAL HIGH (ref 70–99)
Glucose-Capillary: 112 mg/dL — ABNORMAL HIGH (ref 70–99)
Glucose-Capillary: 120 mg/dL — ABNORMAL HIGH (ref 70–99)
Glucose-Capillary: 152 mg/dL — ABNORMAL HIGH (ref 70–99)
Glucose-Capillary: 96 mg/dL (ref 70–99)

## 2014-05-30 MED ORDER — SODIUM CHLORIDE 0.9 % IV SOLN: 250.0000 mL | INTRAVENOUS | Status: DC | PRN

## 2014-05-30 MED ORDER — BISACODYL 10 MG RE SUPP: 10.0000 mg | Freq: Every day | RECTAL | Status: DC | PRN

## 2014-05-30 MED ORDER — ONDANSETRON HCL 4 MG PO TABS: 4.0000 mg | ORAL_TABLET | Freq: Four times a day (QID) | ORAL | Status: DC | PRN

## 2014-05-30 MED ORDER — BISACODYL 5 MG PO TBEC
10.0000 mg | DELAYED_RELEASE_TABLET | Freq: Every day | ORAL | Status: DC | PRN
  Administered 2014-06-01: 10 mg via ORAL
  Filled 2014-05-30: qty 2

## 2014-05-30 MED ORDER — CARVEDILOL 3.125 MG PO TABS
3.1250 mg | ORAL_TABLET | Freq: Two times a day (BID) | ORAL | Status: DC
  Administered 2014-05-30 – 2014-06-01 (×4): 3.125 mg via ORAL
  Filled 2014-05-30 (×7): qty 1

## 2014-05-30 MED ORDER — PANTOPRAZOLE SODIUM 40 MG PO TBEC
40.0000 mg | DELAYED_RELEASE_TABLET | Freq: Every day | ORAL | Status: DC
  Administered 2014-05-31 – 2014-06-05 (×6): 40 mg via ORAL
  Filled 2014-05-30 (×6): qty 1

## 2014-05-30 MED ORDER — ONDANSETRON HCL 4 MG/2ML IJ SOLN
4.0000 mg | Freq: Four times a day (QID) | INTRAMUSCULAR | Status: DC | PRN
  Administered 2014-05-31: 4 mg via INTRAVENOUS
  Filled 2014-05-30: qty 2

## 2014-05-30 MED ORDER — FERROUS GLUCONATE 324 (38 FE) MG PO TABS
324.0000 mg | ORAL_TABLET | Freq: Two times a day (BID) | ORAL | Status: DC
  Administered 2014-05-30 – 2014-06-05 (×12): 324 mg via ORAL
  Filled 2014-05-30 (×14): qty 1

## 2014-05-30 MED ORDER — OXYCODONE HCL 5 MG PO TABS
5.0000 mg | ORAL_TABLET | ORAL | Status: DC | PRN
  Administered 2014-06-03 (×2): 5 mg via ORAL
  Filled 2014-05-30 (×3): qty 1
  Filled 2014-05-30: qty 2

## 2014-05-30 MED ORDER — FOLIC ACID 1 MG PO TABS
1.0000 mg | ORAL_TABLET | Freq: Every day | ORAL | Status: DC
  Administered 2014-05-30 – 2014-06-05 (×7): 1 mg via ORAL
  Filled 2014-05-30 (×7): qty 1

## 2014-05-30 MED ORDER — SODIUM CHLORIDE 0.9 % IJ SOLN
3.0000 mL | Freq: Two times a day (BID) | INTRAMUSCULAR | Status: DC
  Administered 2014-05-31 – 2014-06-05 (×9): 3 mL via INTRAVENOUS

## 2014-05-30 MED ORDER — SODIUM CHLORIDE 0.9 % IJ SOLN: 3.0000 mL | INTRAMUSCULAR | Status: DC | PRN

## 2014-05-30 MED ORDER — MOVING RIGHT ALONG BOOK
Freq: Once | Status: DC
  Filled 2014-05-30: qty 1

## 2014-05-30 MED ORDER — DOCUSATE SODIUM 100 MG PO CAPS
200.0000 mg | ORAL_CAPSULE | Freq: Every day | ORAL | Status: DC
  Administered 2014-05-31 – 2014-06-05 (×4): 200 mg via ORAL
  Filled 2014-05-30 (×6): qty 2

## 2014-05-30 MED ORDER — INSULIN ASPART 100 UNIT/ML ~~LOC~~ SOLN: 0.0000 [IU] | Freq: Three times a day (TID) | SUBCUTANEOUS | Status: DC

## 2014-05-30 MED ORDER — ASPIRIN EC 81 MG PO TBEC
81.0000 mg | DELAYED_RELEASE_TABLET | Freq: Every day | ORAL | Status: DC
  Administered 2014-05-31 – 2014-06-01 (×2): 81 mg via ORAL
  Filled 2014-05-30 (×3): qty 1

## 2014-05-30 MED ORDER — TRAMADOL HCL 50 MG PO TABS
50.0000 mg | ORAL_TABLET | ORAL | Status: DC | PRN
  Administered 2014-05-30: 50 mg via ORAL
  Administered 2014-05-30: 100 mg via ORAL
  Administered 2014-05-31: 50 mg via ORAL
  Administered 2014-05-31: 100 mg via ORAL
  Administered 2014-06-01 – 2014-06-02 (×3): 50 mg via ORAL
  Administered 2014-06-02 – 2014-06-04 (×3): 100 mg via ORAL
  Administered 2014-06-05: 50 mg via ORAL
  Filled 2014-05-30: qty 1
  Filled 2014-05-30 (×4): qty 2
  Filled 2014-05-30 (×2): qty 1
  Filled 2014-05-30 (×2): qty 2
  Filled 2014-05-30 (×3): qty 1

## 2014-05-30 NOTE — Progress Notes (Signed)
Patient ID: KHARI MALLY, male   DOB: 06/11/1940, 74 y.o.   MRN: [id number] [facility] DAILY ICU PROGRESS NOTE                   [address]            [place] [address]          [phone]   2 Days Post-Op Procedure(s) (LRB): REDO CORONARY ARTERY BYPASS GRAFTING (CABG) (N/A) TRANSESOPHAGEAL ECHOCARDIOGRAM (TEE) (N/A)  Total Length of Stay:  LOS: 9 days   Subjective: Alert and feels well no complaints, wants to go home post op not back to SNF  Objective: Vital signs in last 24 hours: Temp:  [97.7 F (36.5 C)-99 F (37.2 C)] 97.7 F (36.5 C) (02/21 0815) Pulse Rate:  [55-110] 90 (02/21 0700) Cardiac Rhythm:  [-] Atrial paced (02/20 2000) Resp:  [13-30] 15 (02/21 0700) BP: (85-128)/(37-74) 119/56 mmHg (02/21 0700) SpO2:  [91 %-100 %] 92 % (02/21 0700) Arterial Line BP: (99-167)/(40-59) 123/48 mmHg (02/20 1600) Weight:  [162 lb 7.7 oz (73.7 kg)] 162 lb 7.7 oz (73.7 kg) (02/21 0345)  Filed Weights   05/28/14 0532 05/29/14 0640 05/30/14 0345  Weight: 152 lb 1.6 oz (68.992 kg) 161 lb 2.5 oz (73.1 kg) 162 lb 7.7 oz (73.7 kg)    Weight change: 1 lb 5.2 oz (0.6 kg)   Hemodynamic parameters for last 24 hours: PAP: (22-28)/(12-17) 28/17 mmHg  Intake/Output from previous day: 02/20 0701 - 02/21 0700 In: 1415.8 [P.O.:960; I.V.:355.8; IV Piggyback:100] Out: 1145 [Urine:1045; Chest Tube:100]  Intake/Output this shift:    Current Meds: Scheduled Meds: . acetaminophen  1,000 mg Oral 4 times per day   Or  . acetaminophen (TYLENOL) oral liquid 160 mg/5 mL  1,000 mg Per Tube 4 times per day  . amiodarone  300 mg Oral BID  . antiseptic oral rinse  7 mL Mouth Rinse BID  . aspirin EC  325 mg Oral Daily   Or  . aspirin  324 mg Per Tube Daily  . atorvastatin  40 mg Oral q1800  . bisacodyl  10 mg Oral Daily   Or  . bisacodyl  10 mg Rectal Daily  . docusate sodium  200 mg Oral Daily  . insulin aspart  0-24 Units Subcutaneous 6 times per day  . metoprolol  tartrate  12.5 mg Oral BID   Or  . metoprolol tartrate  12.5 mg Per Tube BID  . pantoprazole  40 mg Oral Daily  . sodium chloride  10-40 mL Intracatheter Q12H  . sodium chloride  3 mL Intravenous Q12H   Continuous Infusions: . sodium chloride 20 mL/hr at 05/29/14 1900  . nitroGLYCERIN Stopped (05/28/14 1615)  . phenylephrine (NEO-SYNEPHRINE) Adult infusion Stopped (05/29/14 0900)   PRN Meds:.metoprolol, morphine injection, ondansetron (ZOFRAN) IV, oxyCODONE, sodium chloride, sodium chloride, traMADol  General appearance: alert, cooperative and no distress Neurologic: intact Heart: regular rate and rhythm, S1, S2 normal, no murmur, click, rub or gallop Lungs: clear to auscultation bilaterally Abdomen: soft, non-tender; bowel sounds normal; no masses,  no organomegaly Extremities: extremities normal, atraumatic, no cyanosis or edema and Homans sign is negative, no sign of DVT Wound: sternum stable  Lab Results: CBC: Recent Labs  05/29/14 1800 05/30/14 0340  WBC 4.0 4.9  HGB 7.8* 7.6*  HCT 22.9* 22.7*  PLT 69* 71*   BMET:  Recent Labs  05/29/14 0431 05/29/14 1625 05/29/14 1800 05/30/14 0340  NA 138 137  --  135  K 4.1 4.3  --  4.3  CL 108 100  --  105  CO2 26  --   --  27  GLUCOSE 106* 113*  --  112*  BUN 8 11  --  13  CREATININE 0.81 0.90 0.97 0.91  CALCIUM 8.0*  --   --  8.1*    PT/INR:  Recent Labs  05/28/14 1500  LABPROT 16.4*  INR 1.30   Radiology: Dg Chest Port 1 View  05/29/2014   CLINICAL DATA:  CABG x4  EXAM: PORTABLE CHEST - 1 VIEW  COMPARISON:  05/28/2014  FINDINGS: There is a right chest tube appearing unchanged in position. There is a right jugular Swan-Ganz catheter extending well out the right lower lobe artery. Mediastinal drain noted. Endotracheal tube and nasogastric tube have been removed. Left chest tube has been removed. No pneumothorax is evident. Mild atelectatic appearing basilar opacities have partially cleared.  IMPRESSION: Removal of  the endotracheal tube, nasogastric tube and left chest tube. Swan-Ganz catheter extends far out the right lower lobe pulmonary artery. Partial clearance of basilar opacities.   Electronically Signed   By: Andreas Newport M.D.   On: 05/29/2014 05:42     Assessment/Plan: S/P Procedure(s) (LRB): REDO CORONARY ARTERY BYPASS GRAFTING (CABG) (N/A) TRANSESOPHAGEAL ECHOCARDIOGRAM (TEE) (N/A) Mobilize Plan for transfer to step-down: see transfer orders Expected Acute  Blood - loss Anemia- add iron and folate Renal function stable Thrombocytopenia- avoid heparin Currently in sinus, on po cordarone was on Eliquis and Plavix at direction of cardiology, will ask cardiology to see in regard to home meds and anticoagulation    Naveen Lorusso B 05/30/2014 9:27 AM

## 2014-05-31 ENCOUNTER — Inpatient Hospital Stay (HOSPITAL_COMMUNITY): Payer: Medicare Other

## 2014-05-31 ENCOUNTER — Encounter (HOSPITAL_COMMUNITY): Payer: Self-pay | Admitting: Cardiothoracic Surgery

## 2014-05-31 DIAGNOSIS — R319 Hematuria, unspecified: Secondary | ICD-10-CM

## 2014-05-31 LAB — BASIC METABOLIC PANEL
Anion gap: 5 (ref 5–15)
BUN: 12 mg/dL (ref 6–23)
CO2: 25 mmol/L (ref 19–32)
Calcium: 8.1 mg/dL — ABNORMAL LOW (ref 8.4–10.5)
Chloride: 100 mmol/L (ref 96–112)
Creatinine, Ser: 0.81 mg/dL (ref 0.50–1.35)
GFR calc Af Amer: >90 mL/min (ref >90)
GFR calc non Af Amer: 85 mL/min — ABNORMAL LOW (ref >90)
Glucose, Bld: 120 mg/dL — ABNORMAL HIGH (ref 70–99)
Potassium: 3.9 mmol/L (ref 3.5–5.1)
Sodium: 130 mmol/L — ABNORMAL LOW (ref 135–145)

## 2014-05-31 LAB — TYPE AND SCREEN
ABO/RH(D): O POS
Antibody Screen: NEGATIVE
Unit division: 0
Unit division: 0
Unit division: 0
Unit division: 0
Unit division: 0
Unit division: 0

## 2014-05-31 LAB — GLUCOSE, CAPILLARY: Glucose-Capillary: 118 mg/dL — ABNORMAL HIGH (ref 70–99)

## 2014-05-31 LAB — CBC
HCT: 22.9 % — ABNORMAL LOW (ref 39.0–52.0)
Hemoglobin: 7.8 g/dL — ABNORMAL LOW (ref 13.0–17.0)
MCH: 31.3 pg (ref 26.0–34.0)
MCHC: 34.1 g/dL (ref 30.0–36.0)
MCV: 92 fL (ref 78.0–100.0)
Platelets: 91 10*3/uL — ABNORMAL LOW (ref 150–400)
RBC: 2.49 MIL/uL — ABNORMAL LOW (ref 4.22–5.81)
RDW: 16.1 % — ABNORMAL HIGH (ref 11.5–15.5)
WBC: 6.5 10*3/uL (ref 4.0–10.5)

## 2014-05-31 MED FILL — Electrolyte-R (PH 7.4) Solution: INTRAVENOUS | Qty: 3000 | Status: AC

## 2014-05-31 MED FILL — Potassium Chloride Inj 2 mEq/ML: INTRAVENOUS | Qty: 40 | Status: AC

## 2014-05-31 MED FILL — Sodium Chloride IV Soln 0.9%: INTRAVENOUS | Qty: 2000 | Status: AC

## 2014-05-31 MED FILL — Lidocaine HCl IV Inj 20 MG/ML: INTRAVENOUS | Qty: 5 | Status: AC

## 2014-05-31 MED FILL — Heparin Sodium (Porcine) Inj 1000 Unit/ML: INTRAMUSCULAR | Qty: 20 | Status: AC

## 2014-05-31 MED FILL — Heparin Sodium (Porcine) Inj 1000 Unit/ML: INTRAMUSCULAR | Qty: 30 | Status: AC

## 2014-05-31 MED FILL — Sodium Bicarbonate IV Soln 8.4%: INTRAVENOUS | Qty: 50 | Status: AC

## 2014-05-31 MED FILL — Magnesium Sulfate Inj 50%: INTRAMUSCULAR | Qty: 10 | Status: AC

## 2014-05-31 MED FILL — Mannitol IV Soln 20%: INTRAVENOUS | Qty: 500 | Status: AC

## 2014-05-31 NOTE — Progress Notes (Signed)
   05/31/14 0417  What Happened  Was fall witnessed? No  Was patient injured? Yes  Patient found on floor  Found by Staff-comment  Stated prior activity bathroom-unassisted  Follow Up  MD notified (Dr Servando Snare, no new orders received from MD)  Time MD notified [phone]  Family notified No- patient refusal  Additional tests No  Simple treatment Other (comment) (tegaderm applied to right arm skin tears,ice to back of head)  Adult Fall Risk Assessment  Risk Factor Category (scoring not indicated) High fall risk per protocol (document High fall risk)  Age 74  Fall History: Fall within 6 months prior to admission 0  Elimination; Bowel and/or Urine Incontinence 2  Elimination; Bowel and/or Urine Urgency/Frequency 0  Medications: includes PCA/Opiates, Anti-convulsants, Anti-hypertensives, Diuretics, Hypnotics, Laxatives, Sedatives, and Psychotropics 3  Patient Care Equipment 1  Mobility-Assistance 2  Mobility-Gait 2  Mobility-Sensory Deficit 0  Cognition-Awareness 1  Cognition-Impulsiveness 0  Cognition-Limitations 0  Total Score 13  Patient's Fall Risk High Fall Risk (>13 points)  Adult Fall Risk Interventions  Required Bundle Interventions *See Row Information* High fall risk - low, moderate, and high requirements implemented  Additional Interventions Fall risk signage;Room near nurses station;Use of appropriate toileting equipment (bedpan, BSC, etc.)  Vitals  Temp 97.8 F (36.6 C)  Temp Source Oral  BP (!) 160/69 mmHg  BP Location Left Arm  BP Method Automatic  Patient Position (if appropriate) Lying  Pulse Rate 78  Pulse Rate Source Dinamap  Resp (!) 24  Oxygen Therapy  SpO2 91 %  O2 Device Room Air  Pain Assessment  Pain Assessment No/denies pain  Pain Score 0  Neurological  Neuro (WDL) WDL  Level of Consciousness Alert  Orientation Level Oriented X4  Cognition Appropriate at baseline;Appropriate attention/concentration  Speech Clear   Dr Servando Snare notified via  Phone  of patient unassisted fall to floor.  Patient reported was returning from using the bathroom and "lost balance". Patient remains alert and oriented X4,VSS, normal sinus rhythm, all surgical incisions intact.  Patient with skin tears on right forearm(tegaderm applied),with raised, swollen area on left posterior portion of head.  Patient complains of headache, ice applied to back of head.  Fall risk bundle completed.  Patient resting comfortably in bed with bed alarm turned on and patient educated on notifying RN/Nurse Tech for assistance to bathroom, patient verbalized understanding.  No new orders received from Dr Servando Snare.  RN will continue to monitor patient

## 2014-05-31 NOTE — Clinical Social Work Note (Addendum)
Patient had surgery on Friday, unit CSW to follow if patient needs SNF.  Jones Broom. [place], MSW, [place] 05/31/2014 9:12 AM

## 2014-05-31 NOTE — Progress Notes (Signed)
05/31/2014 0934 Pt. C/o nausea. Prn zofran given. Pt. Denies h/a, blurred vision. Vital sign collected and stable. Will continue to monitor. Blain Hunsucker, Arville Lime

## 2014-05-31 NOTE — Progress Notes (Signed)
CARDIAC REHAB PHASE I   PRE:  Rate/Rhythm: 64 SR  BP:  Supine: 128/53  Sitting:   Standing:    SaO2: 90-91%RA  MODE:  Ambulation: 150 ft   POST:  Rate/Rhythm: 85 SR  BP:  Supine:   Sitting: 121/48  Standing:    SaO2: 93%RA 1335-1401 Pt walked 150 ft on RA with gait belt use, rolling walker and asst x 2. Followed with rollator and pt sat twice due to legs weak and jittery. To recliner after walk. Family in room. Pt has call light and encouraged not to get up by himself.  Weaker today. No c/o dizziness.   Graylon Good, RN BSN  05/31/2014 1:58 PM

## 2014-05-31 NOTE — Op Note (Signed)
NAMEGUERIN, LASHOMB NO.:  [id number]  MEDICAL RECORD NO.:  [id number]  LOCATION:  [id number]                        FACILITY:  [facility]  PHYSICIAN:  Lanelle Bal, MD    DATE OF BIRTH:  1940/10/07  DATE OF PROCEDURE:  05/27/2014 DATE OF DISCHARGE:                              OPERATIVE REPORT   PREOPERATIVE DIAGNOSIS:  Three-vessel coronary artery disease with left main obstruction.  POSTOPERATIVE DIAGNOSIS:  Three-vessel coronary artery disease with left main obstruction.  SURGICAL PROCEDURE:  Redo Coronary artery bypass grafting with the right internal mammary to the intermediate coronary artery, reverse right greater saphenous vein graft to the obtuse marginal coronary artery, reverse greater saphenous vein graft to the posterior descending coronary artery and reverse Greater saphenous vein graft to the distal LAD with redo sternotomy.  SURGEON:  Lanelle Bal, MD  FIRST ASSISTANT:  Suzzanne Cloud, PA-C.  BRIEF HISTORY:  The patient is a 74 year old male who in 1992, had undergone coronary artery bypass grafting with the left internal mammary to the distal circumflex branch and the reverse saphenous vein graft to the posterior descending coronary artery.  The patient states he had done well for many years, but presented acutely several weeks before in cardiac arrest.  Urgent angioplasty was performed.  The patient was in respiratory failure, vent dependent with a balloon pump and angioplasty had been attempted on the left main obstruction and was partially performed involving the circumflex, but still with incomplete revascularization.  The patient gradually improved.  He required nursing home placement for a period of time.  He began improving significantly and was readmitted with recurrent left arm pain.  He had been discharged home on Plavix and Eliquis.  These were held and the patient was re-seen for consideration of bypass surgery.  At this point,  coronary artery bypass grafting redo was recommended to the patient who agreed and signed informed consent.  DESCRIPTION OF PROCEDURE:  With Swan-Ganz and arterial line monitors in place, the patient underwent general endotracheal anesthesia without incident.  Using the Guid[employer] Endovein harvesting system, segment of vein was harvested from the left thigh.  A segment of greater saphenous vein was harvested from the left thigh and calf and was of good quality and caliber.  Median sternotomy was performed with a sagittal saw, taking care not to injure underlying structures.  The right internal mammary artery was then dissected down as a pedicle graft.  Initially, we had planned to place this as a pedicle graft to the LAD.  However, the proximal 2/3rd of the LAD was intramyocardial and because of the length of the LAD, the mammary would not reach to the very distal LAD.  For this reason, it was placed to the large intermediate branch.  With tedious dissection, the ascending aorta and the right atrium were ultimately dissected out of the previous adhesions.  The patient was systemically heparinized.  Ascending aorta was cannulated.  The right atrium was cannulated.  Retrograde cardioplegic catheter was placed. The patient was then placed on cardiopulmonary bypass 2.4 L/m2.  Because of low preoperative hematocrit, he did require 2 units of packed red blood cells be placed in the pump.  We then finished dissecting out the left  atrium.  The very distal circumflex with the mammary artery had been placed of a very small branch.  Posterior descending was small, but thought to be bypassable.  Two branches high on the lateral wall the intermediate and the first obtuse marginal were identified.  As noted, the LAD was intramyocardial, proximal 2/3rd of the vessel and the very distal LAD was of reasonable size, and was located.  The patient's body temperature was cooled to 32 degrees.  Aortic crossclamp  was applied and 500 mL of cold blood potassium cardioplegia were administered with diastolic arrest of the heart.  Myocardial septal temperature was monitored throughout the crossclamp.  We turned attention first to the posterior descending coronary artery which was opened just distal to the previous insertion of the graft vessel admitted a 1-1/2 mm probe.  Using a running 7-0 Prolene and distal anastomosis was performed with a second reverse saphenous vein graft.  The heart was then elevated and the obtuse marginal vessel was identified and opened.  This vessel was 1.3- 1.4 mm in size.  Using a running 7-0 Prolene, distal anastomosis was performed.  The right internal mammary artery was anastomosed as a pedicle graft to the intermediate coronary artery which was partially intramyocardial.  The vessel was tacked to the epicardium.  The segment of reverse saphenous vein graft was then placed to the very distal LAD. Additional cold blood cardioplegia was administered down the vein grafts.  With crossclamp still in place, three punch aortotomies were performed.  Two were new and the old posterior descending graft was opened.  Each of the 3 vein grafts were anastomosed to the ascending aorta.  The heart was allowed to passively fill and de-air.  Anastomoses were completed and the cross-clamp was removed with a total cross-clamp time of 100 minutes.  The patient spontaneously converted to a sinus rhythm.  His body temperature rewarmed to 37 degrees.  He was then ventilated and weaned from cardiopulmonary bypass on low-dose dopamine. He remained hemodynamically stable.  He was decannulated in usual fashion.  Protamine sulfate was administered.  A right chest tube and a Blake mediastinal drain were left in place.  The sternum was closed with #6 stainless steel wire.  Fascia was closed with interrupted 0 Vicryl, running 3-0 Vicryl, subcutaneous tissue, and 4-0 subcuticular stitch in skin edges.   Dry dressings were applied.  Sponge and needle count was reported as correct at completion of procedure.  The patient tolerated the procedure without obvious complication and was transferred to Surgical Intensive Care Unit for further postoperative care.  Total pump time was 141 minutes.     Lanelle Bal, MD     EG/MEDQ  D:  05/31/2014  T:  05/31/2014  Job:  [id number]  cc:   Thayer Headings, M.D.

## 2014-05-31 NOTE — Progress Notes (Signed)
05/31/2014 1030 Pt. C/o mild headache and dizziness with movement. Pt. Sitting on side of bed and assisted to lay back down.  Neuro check completed and unremarkable at this time. Vital signs collected and stable. No changes in soft tissue swelling from am assessment. Denies blurred vision. Bed Alarm continued for patient safety. Will continue to monitor patient. [name], Arville Lime

## 2014-05-31 NOTE — Progress Notes (Addendum)
[name]       [place] [address]             [id number]      3 Days Post-Op Procedure(s) (LRB): REDO CORONARY ARTERY BYPASS GRAFTING (CABG) (N/A) TRANSESOPHAGEAL ECHOCARDIOGRAM (TEE) (N/A) Subjective: Fell last night, small amt soft tissue swelling to back of head, no neuro sx/no headaches. He believes she slipped.   Objective: Vital signs in last 24 hours: Temp:  [97.6 F (36.4 C)-98.3 F (36.8 C)] 97.9 F (36.6 C) (02/22 0600) Pulse Rate:  [63-90] 63 (02/22 0600) Cardiac Rhythm:  [-] Atrial paced (02/22 0600) Resp:  [17-28] 20 (02/22 0600) BP: (83-160)/(47-74) 130/74 mmHg (02/22 0600) SpO2:  [86 %-99 %] 98 % (02/22 0600) Weight:  [166 lb 14.4 oz (75.705 kg)] 166 lb 14.4 oz (75.705 kg) (02/22 0417)  Hemodynamic parameters for last 24 hours:    Intake/Output from previous day: 02/21 0701 - 02/22 0700 In: 420 [P.O.:360; I.V.:60] Out: 200 [Urine:200] Intake/Output this shift:    General appearance: alert, cooperative and no distress Heart: regular rate and rhythm Lungs: mildly dim in bases Abdomen: benign Extremities: no sig edema Wound: incis ok- small skin tear to right forearm, small hematoma posterior skull-no laveration Neuro exam- non-focal Lab Results:  Recent Labs  05/30/14 0340 05/31/14 0500  WBC 4.9 6.5  HGB 7.6* 7.8*  HCT 22.7* 22.9*  PLT 71* 91*   BMET:  Recent Labs  05/30/14 0340 05/31/14 0500  NA 135 130*  K 4.3 3.9  CL 105 100  CO2 27 25  GLUCOSE 112* 120*  BUN 13 12  CREATININE 0.91 0.81  CALCIUM 8.1* 8.1*    PT/INR:  Recent Labs  05/28/14 1500  LABPROT 16.4*  INR 1.30   ABG    Component Value Date/Time   PHART 7.354 05/29/2014 0002   HCO3 22.6 05/29/2014 0002   TCO2 22 05/29/2014 1625   ACIDBASEDEF 3.0* 05/29/2014 0002   O2SAT 96.0 05/29/2014 0002   CBG (last 3)   Recent Labs  05/30/14 0005 05/30/14 0345 05/30/14 0808  GLUCAP 112* 118* 104*    Meds Scheduled Meds: . amiodarone   300 mg Oral BID  . antiseptic oral rinse  7 mL Mouth Rinse BID  . aspirin EC  81 mg Oral Daily  . atorvastatin  40 mg Oral q1800  . carvedilol  3.125 mg Oral BID WC  . docusate sodium  200 mg Oral Daily  . ferrous gluconate  324 mg Oral BID WC  . folic acid  1 mg Oral Daily  . moving right along book   Does not apply Once  . pantoprazole  40 mg Oral QAC breakfast  . sodium chloride  3 mL Intravenous Q12H   Continuous Infusions:  PRN Meds:.sodium chloride, bisacodyl **OR** bisacodyl, ondansetron **OR** ondansetron (ZOFRAN) IV, oxyCODONE, sodium chloride, traMADol  Xrays Dg Chest Port 1 View  05/30/2014   CLINICAL DATA:  Status post CABG x3  EXAM: PORTABLE CHEST - 1 VIEW  COMPARISON:  05/29/2014  FINDINGS: Swan-Ganz catheter is been removed. The right jugular sheath remains. The right chest tube has been removed. Mediastinal drain is been removed. No pneumothorax is evident. Mild atelectatic appearing basilar opacities persist bilaterally.  IMPRESSION: Removal of mediastinal drain, right chest tube and SG catheter. Persistent mild atelectatic opacities   Electronically Signed   By: Andreas Newport M.D.   On: 05/30/2014 05:44    Assessment/Plan: S/P Procedure(s) (LRB): REDO CORONARY ARTERY  BYPASS GRAFTING (CABG) (N/A) TRANSESOPHAGEAL ECHOCARDIOGRAM (TEE) (N/A)  1 Fall- no neuro sx, observe. AC rx is risky d/t fall- cardiology recs plavix and eliquis if possible. 2 Currently in sinus in 60's 3 labs stable - H/H is close to transfusion threshold, cont to observe- platelets improved 4 rehab/pulm toilet- routine  LOS: 10 days    GOLD,WAYNE E 05/31/2014  Holding sinus, fell last pm, no obvious injury noted on exam PT to see I have seen and examined Tamela Gammon Witts and agree with the above assessment  and plan.  Grace Isaac MD Beeper [phone] Office [phone] 05/31/2014 2:32 PM

## 2014-05-31 NOTE — Progress Notes (Signed)
Pt ambulated 150 ft with rolling walker and RN on RA. PT sat down once to rest halfway through the walk due to fatigue. Pt returned to room with call bell in reach and bed alarm turned on .

## 2014-05-31 NOTE — Progress Notes (Signed)
05/31/2014 0800 Progress notes External pacemaker continuing to experience failure to sense despite alterations in MA and sensitivity s/p fall. Prior pacer setting AAI 74, currently SR 64. Verbal order Jadene Pierini ok to leave external pacemaker attached but turned off. Orders enacted. Pt. Updated on plan of care.  Vena Bassinger, Arville Lime

## 2014-05-31 NOTE — Progress Notes (Signed)
SUBJECTIVE:  Patient is status post a long course with PCI and eventually followed by CABG. He's had paroxysmal atrial fib. It has been felt that he needs to be on anticoagulant and antiplatelet medication in addition to amiodarone. He is now early post CABG. There is also been a significant problem with hematuria.   Filed Vitals:   05/31/14 0417 05/31/14 0450 05/31/14 0525 05/31/14 0600  BP: 160/69 144/67 134/63 130/74  Pulse: 78 67 64 63  Temp: 97.8 F (36.6 C) 97.8 F (36.6 C) 97.9 F (36.6 C) 97.9 F (36.6 C)  TempSrc: Oral Oral Oral Oral  Resp: 24 22 20 20   Height:      Weight: 166 lb 14.4 oz (75.705 kg)     SpO2: 91% 93% 94% 98%     Intake/Output Summary (Last 24 hours) at 05/31/14 0743 Last data filed at 05/30/14 1100  Gross per 24 hour  Intake    420 ml  Output    200 ml  Net    220 ml    LABS: Basic Metabolic Panel:  Recent Labs  05/29/14 0431  05/29/14 1800 05/30/14 0340 05/31/14 0500  NA 138  < >  --  135 130*  K 4.1  < >  --  4.3 3.9  CL 108  < >  --  105 100  CO2 26  --   --  27 25  GLUCOSE 106*  < >  --  112* 120*  BUN 8  < >  --  13 12  CREATININE 0.81  < > 0.97 0.91 0.81  CALCIUM 8.0*  --   --  8.1* 8.1*  MG 2.6*  --  2.6*  --   --   < > = values in this interval not displayed. Liver Function Tests: No results for input(s): AST, ALT, ALKPHOS, BILITOT, PROT, ALBUMIN in the last 72 hours. No results for input(s): LIPASE, AMYLASE in the last 72 hours. CBC:  Recent Labs  05/30/14 0340 05/31/14 0500  WBC 4.9 6.5  HGB 7.6* 7.8*  HCT 22.7* 22.9*  MCV 93.0 92.0  PLT 71* 91*   Cardiac Enzymes: No results for input(s): CKTOTAL, CKMB, CKMBINDEX, TROPONINI in the last 72 hours. BNP: Invalid input(s): POCBNP D-Dimer: No results for input(s): DDIMER in the last 72 hours. Hemoglobin A1C: No results for input(s): HGBA1C in the last 72 hours. Fasting Lipid Panel: No results for input(s): CHOL, HDL, LDLCALC, TRIG, CHOLHDL, LDLDIRECT in the  last 72 hours. Thyroid Function Tests: No results for input(s): TSH, T4TOTAL, T3FREE, THYROIDAB in the last 72 hours.  Invalid input(s): FREET3  RADIOLOGY: Ct Abdomen Pelvis W Wo Contrast  05/26/2014   CLINICAL DATA:  Status post robotic radical prostatectomy 6 years ago with chronic painless hematuria, preop cardiac bypass on Friday  EXAM: CT ABDOMEN AND PELVIS WITHOUT AND WITH CONTRAST  TECHNIQUE: Multidetector CT imaging of the abdomen and pelvis was performed following the standard protocol before and following the bolus administration of intravenous contrast.  CONTRAST:  134mL OMNIPAQUE IOHEXOL 300 MG/ML  SOLN  COMPARISON:  11/04/2009  FINDINGS: Lower chest: Emphysematous changes with dependent atelectasis at the lung bases.  Hepatobiliary: 3 mm cyst in the posterior segment right hepatic lobe (series 301/ image 12). No suspicious/enhancing hepatic lesions.  Layering gallbladder sludge and/or gallstones (series 31/ image 34). No intrahepatic or extrahepatic ductal dilatation.  Pancreas: Within normal limits.  Spleen: Within normal limits.  Adrenals/Urinary Tract: Adrenal glands are unremarkable.  4 mm cyst  in the lateral left upper kidney. Right kidney is within normal limits. No enhancing renal lesions. No hydronephrosis.  No renal, ureteral, or bladder calculi.  Bladder is within normal limits.  Stomach/Bowel: Stomach is moderately distended but within normal limits.  No evidence of bowel obstruction.  Rectosigmoid colon is mildly thick-walled but underdistended.  Vascular/Lymphatic: Atherosclerotic calcifications of the abdominal aorta and branch vessels.  No suspicious abdominopelvic lymphadenopathy.  Reproductive: Status post prostatectomy.  Other: No abdominopelvic ascites.  Tiny fat containing right inguinal hernia.  Musculoskeletal: Degenerative changes of the visualized thoracolumbar spine.  IMPRESSION: No renal, ureteral, or bladder calculi.  No hydronephrosis.  4 mm cyst in the left upper  kidney.  No enhancing renal lesions.  Status post prostatectomy. No evidence of metastatic disease in the abdomen/pelvis.   Electronically Signed   By: Julian Hy M.D.   On: 05/26/2014 12:32   Dg Chest Port 1 View  05/30/2014   CLINICAL DATA:  Status post CABG x3  EXAM: PORTABLE CHEST - 1 VIEW  COMPARISON:  05/29/2014  FINDINGS: Swan-Ganz catheter is been removed. The right jugular sheath remains. The right chest tube has been removed. Mediastinal drain is been removed. No pneumothorax is evident. Mild atelectatic appearing basilar opacities persist bilaterally.  IMPRESSION: Removal of mediastinal drain, right chest tube and SG catheter. Persistent mild atelectatic opacities   Electronically Signed   By: Andreas Newport M.D.   On: 05/30/2014 05:44   Dg Chest Port 1 View  05/29/2014   CLINICAL DATA:  CABG x4  EXAM: PORTABLE CHEST - 1 VIEW  COMPARISON:  05/28/2014  FINDINGS: There is a right chest tube appearing unchanged in position. There is a right jugular Swan-Ganz catheter extending well out the right lower lobe artery. Mediastinal drain noted. Endotracheal tube and nasogastric tube have been removed. Left chest tube has been removed. No pneumothorax is evident. Mild atelectatic appearing basilar opacities have partially cleared.  IMPRESSION: Removal of the endotracheal tube, nasogastric tube and left chest tube. Swan-Ganz catheter extends far out the right lower lobe pulmonary artery. Partial clearance of basilar opacities.   Electronically Signed   By: Andreas Newport M.D.   On: 05/29/2014 05:42   Dg Chest Port 1 View  05/28/2014   CLINICAL DATA:  Status post coronary artery bypass grafting. Hypoxia.  EXAM: PORTABLE CHEST - 1 VIEW  COMPARISON:  May 20, 2014  FINDINGS: Patient is status post coronary artery bypass grafting. Endotracheal tube tip is 6.6 cm above the carina. The Swan-Ganz catheter tip is in the midportion of the right interlobar pulmonary artery. The tip is approximately 5  cm distal to the main right pulmonary artery. Nasogastric tube tip and side port are in the stomach. There is a chest tube on the right. Temporary pacemaker wires are attached to the right heart. No pneumothorax.  There is left lower lobe consolidation with small left effusion. There is subsegmental atelectasis in the right base. Heart size is normal. Pulmonary vascularity is normal. There is atherosclerotic change in the aorta.  IMPRESSION: Tube and catheter positions as described without pneumothorax. Note that the Swan-Ganz catheter tip is well into the right interlobar pulmonary artery with the tip approximately 5 cm distal to the right main pulmonary artery.  There is left lower lobe consolidation with small left effusion. There is mild right base atelectasis.  Heart size is within normal limits.   Electronically Signed   By: Lowella Grip III M.D.   On: 05/28/2014 16:05   Dg  Chest Port 1 View  05/21/2014   CLINICAL DATA:  Left arm burning around 430 this morning resolved in then came back at 2000 hr tonight. Left arm numbness intermittently.  EXAM: PORTABLE CHEST - 1 VIEW  COMPARISON:  05/07/2014  FINDINGS: Postoperative changes in the mediastinum. Normal heart size and pulmonary vascularity. No focal airspace disease or consolidation in the lungs. No blunting of costophrenic angles. No pneumothorax. Mediastinal contours appear intact.  IMPRESSION: No active disease.   Electronically Signed   By: Lucienne Capers M.D.   On: 05/21/2014 00:36   Dg Chest Port 1 View  05/07/2014   CLINICAL DATA:  Acute respiratory failure  EXAM: PORTABLE CHEST - 1 VIEW  COMPARISON:  05/06/2014  FINDINGS: Cardiac shadow is stable. Postsurgical changes are again seen. An endotracheal tube, nasogastric catheter and right jugular central venous line are again seen and stable in appearance. Minimal persistent changes in the bases are seen stable from the prior exam. No new focal abnormality is seen. No bony abnormality is  noted.  IMPRESSION: Tubes and lines as described above.  No significant interval change from the previous day.   Electronically Signed   By: Inez Catalina M.D.   On: 05/07/2014 07:24   Dg Chest Port 1 View  05/06/2014   CLINICAL DATA:  Respiratory acidosis.  EXAM: PORTABLE CHEST - 1 VIEW  COMPARISON:  05/05/2014.  FINDINGS: Endotracheal tube, NG tube, right IJ line stable position. Prior CABG. Heart size stable. Bibasilar pulmonary infiltrates have improved slightly. No prominent pleural effusion. No pneumothorax. No acute bony abnormality.  IMPRESSION: 1. Lines and tubes in stable position. 2. Interim slight improvement of bibasilar pulmonary alveolar infiltrates consistent with partial clearing of pulmonary edema and/or pneumonia. 3. Prior CABG.  Heart size stable.   Electronically Signed   By: Marcello Moores  Register   On: 05/06/2014 07:48   Dg Chest Port 1 View  05/05/2014   CLINICAL DATA:  Acute respiratory failure  EXAM: PORTABLE CHEST - 1 VIEW  COMPARISON:  05/04/2014  FINDINGS: Cardiomediastinal silhouette is stable. Status post median sternotomy. Stable endotracheal and NG tube position. Right IJ central line is unchanged in position. Persistent mild congestion/pulmonary edema. Probable bilateral small pleural effusion with bilateral basilar atelectasis or infiltrate.  IMPRESSION: Stable support apparatus. Persistent mild congestion/pulmonary edema. Probable bilateral small pleural effusion with bilateral basilar atelectasis or infiltrate.   Electronically Signed   By: Lahoma Crocker M.D.   On: 05/05/2014 08:42   Dg Chest Port 1 View  05/04/2014   CLINICAL DATA:  74 year old male with acute respiratory failure on a ventilator.  EXAM: PORTABLE CHEST - 1 VIEW  COMPARISON:  Chest x-ray 05/03/2014.  FINDINGS: An endotracheal tube is in place with tip 5.9 cm above the carina. There is a right-sided internal jugular central venous catheter with tip terminating in the distal superior vena cava. Transcutaneous  pacer/defibrillator pads project over the mid and left hemithorax. A nasogastric tube is seen extending into the stomach, however, the tip of the nasogastric tube extends below the lower margin of the image. Lung volumes appear normal. There is cephalization of the pulmonary vasculature, indistinctness of the interstitial markings, and patchy airspace disease throughout the lungs bilaterally suggestive of moderate pulmonary edema. Small to moderate bilateral pleural effusions. Mild cardiomegaly. The patient is rotated to the right on today's exam, resulting in distortion of the mediastinal contours and reduced diagnostic sensitivity and specificity for mediastinal pathology. Atherosclerosis in the thoracic aorta.  IMPRESSION: 1. Support apparatus, as  above. 2. The appearance the chest suggests underlying congestive heart failure, as above. 3. Bibasilar opacities presumably reflect a combination of atelectasis with superimposed small to moderate bilateral pleural effusions, however, underlying airspace consolidation from infection or aspiration is not excluded.   Electronically Signed   By: Vinnie Langton M.D.   On: 05/04/2014 11:34   Dg Chest Port 1 View  05/03/2014   CLINICAL DATA:  ETT advanced.  Respiratory failure  EXAM: PORTABLE CHEST - 1 VIEW  COMPARISON:  05/02/2014  FINDINGS: The endotracheal tube tip is midway between the clavicular heads and the tracheal carina. The nasogastric tube extends well into the stomach and off the inferior edge of the image. Right jugular central line extends into the SVC about 1 cm below the azygos vein junction.  No pneumothorax is evident. No large effusions are evident. Mild basilar opacities are present, partially cleared on the left where there formerly was a more confluent opacity.  IMPRESSION: Lines and tubes as described.  Slight improvement in the left base.   Electronically Signed   By: Andreas Newport M.D.   On: 05/03/2014 16:16   Dg Chest Port 1  View  05/02/2014   CLINICAL DATA:  Acute respiratory failure  EXAM: PORTABLE CHEST - 1 VIEW  COMPARISON:  05/01/2014  FINDINGS: Cardiac shadow is stable. An endotracheal tube, nasogastric catheter and right jugular line are again seen and stable in appearance. Postsurgical changes are again noted. Small pleural effusions are seen bilaterally. Stable retrocardiac atelectasis is noted.  IMPRESSION: Stable left retrocardiac atelectasis. Small pleural effusions bilaterally.  Tubes and lines as described.   Electronically Signed   By: Inez Catalina M.D.   On: 05/02/2014 07:14    PHYSICAL EXAM   patient is oriented to person time and place. Affect is normal. He has multiple areas of ecchymoses. He has a bandage on his right neck. Lungs reveal scattered rhonchi. Cardiac exam reveals S1 and S2. The abdomen is soft. There is no peripheral edema.   TELEMETRY: I personally reviewed telemetry today May 31, 2014. There is normal sinus rhythm.   ASSESSMENT AND PLAN:     STEMI 05/04/14 with shock, CPR, CHB, VT     Fortunately he recovered from all of this and was able to undergo bypass surgery and is doing well.    CAD S/P LM and CFX PCI 05/04/14     Because he is status post PCI he needs to be on antiplatelet medication. The choice at this time and accommodation with an anticoagulant would be Plavix instead of aspirin.    S/P CABG x 2    PAF- on Amiodarone    He is on 300 mg by mouth twice a day. Over the next 48 hours he can be lowered to 200 twice a day. He is holding sinus rhythm at this time.    Chronic anticoagulation-on Eliquis at discharge 05/11/14      It would be optimal to resume Eliquis. We need to be sure that his hematuria is stable.    Hematuria    Before surgery this was a significant problem.  Eventually we would like to have him on Eliquis and Plavix. As of today he is on aspirin. I have not made any changes. Will need further input from Dr. Servando Snare concerning the timing of using  these medications and the safety as it relates to his recent hematuria. We will continue to follow.  Dola Argyle 05/31/2014 7:43 AM

## 2014-06-01 MED ORDER — LISINOPRIL 2.5 MG PO TABS
2.5000 mg | ORAL_TABLET | Freq: Every day | ORAL | Status: DC
  Administered 2014-06-01 – 2014-06-02 (×2): 2.5 mg via ORAL
  Filled 2014-06-01 (×3): qty 1

## 2014-06-01 MED ORDER — ADULT MULTIVITAMIN W/MINERALS CH
1.0000 | ORAL_TABLET | Freq: Every day | ORAL | Status: DC
  Administered 2014-06-01 – 2014-06-05 (×5): 1 via ORAL
  Filled 2014-06-01 (×5): qty 1

## 2014-06-01 MED ORDER — CARVEDILOL 6.25 MG PO TABS
6.2500 mg | ORAL_TABLET | Freq: Two times a day (BID) | ORAL | Status: DC
  Administered 2014-06-01 – 2014-06-02 (×2): 6.25 mg via ORAL
  Filled 2014-06-01 (×4): qty 1

## 2014-06-01 MED ORDER — CLOPIDOGREL BISULFATE 75 MG PO TABS
75.0000 mg | ORAL_TABLET | Freq: Every day | ORAL | Status: DC
  Administered 2014-06-01 – 2014-06-02 (×2): 75 mg via ORAL
  Filled 2014-06-01 (×2): qty 1

## 2014-06-01 MED ORDER — ENSURE COMPLETE PO LIQD
237.0000 mL | Freq: Two times a day (BID) | ORAL | Status: DC
  Administered 2014-06-01 – 2014-06-05 (×7): 237 mL via ORAL
  Filled 2014-06-01: qty 237

## 2014-06-01 NOTE — Progress Notes (Signed)
Patient converted to A-fib. HR 90-100. Patient asymptomatic. VSS. Strip saved in EPIC. Will continue to monitor  [place]

## 2014-06-01 NOTE — Progress Notes (Signed)
06/01/2014 1200 Nursing note Pt. Unclear as to last bm. Last documented bm 05/25/14. Per report pt. With small bm on 05/30/14. Pt. With bs x all 4 quadrants per assessment, passing flatus, denies nausea. Prn ducolax po (po route preferred by patient) given along with prune juice. Will continue to monitor patient.  Kelaiah Escalona, Arville Lime

## 2014-06-01 NOTE — Consult Note (Signed)
Met with the patient at bedside to offer [facility] services as a covered benefit of insurance. Patient wanted to review the information and a folder with [facility] information given. Will follow up with patient on his decision.  Of note, [facility] Care Management services does not replace or interfere with any services that are arranged by inpatient case management or social work. For additional questions or referrals please contact, Natividad Brood, RN BSN CCM, [facility] Liaison at [phone].

## 2014-06-01 NOTE — Progress Notes (Signed)
SUBJECTIVE   the patient was in sinus rhythm yesterday. Early today he reverted atrial fib. His rate is in the range of 105. He continues to receive amiodarone load.   Filed Vitals:   05/31/14 2052 06/01/14 0329 06/01/14 0513 06/01/14 0914  BP: 146/58  112/59 127/67  Pulse: 74  107 109  Temp: 98.4 F (36.9 C)  97.9 F (36.6 C)   TempSrc: Oral  Oral   Resp: 18  18   Height:      Weight:  163 lb 4.8 oz (74.072 kg)    SpO2: 93%  93%      Intake/Output Summary (Last 24 hours) at 06/01/14 0950 Last data filed at 05/31/14 1714  Gross per 24 hour  Intake    240 ml  Output      0 ml  Net    240 ml    LABS: Basic Metabolic Panel:  Recent Labs  05/29/14 1800 05/30/14 0340 05/31/14 0500  NA  --  135 130*  K  --  4.3 3.9  CL  --  105 100  CO2  --  27 25  GLUCOSE  --  112* 120*  BUN  --  13 12  CREATININE 0.97 0.91 0.81  CALCIUM  --  8.1* 8.1*  MG 2.6*  --   --    Liver Function Tests: No results for input(s): AST, ALT, ALKPHOS, BILITOT, PROT, ALBUMIN in the last 72 hours. No results for input(s): LIPASE, AMYLASE in the last 72 hours. CBC:  Recent Labs  05/30/14 0340 05/31/14 0500  WBC 4.9 6.5  HGB 7.6* 7.8*  HCT 22.7* 22.9*  MCV 93.0 92.0  PLT 71* 91*   Cardiac Enzymes: No results for input(s): CKTOTAL, CKMB, CKMBINDEX, TROPONINI in the last 72 hours. BNP: Invalid input(s): POCBNP D-Dimer: No results for input(s): DDIMER in the last 72 hours. Hemoglobin A1C: No results for input(s): HGBA1C in the last 72 hours. Fasting Lipid Panel: No results for input(s): CHOL, HDL, LDLCALC, TRIG, CHOLHDL, LDLDIRECT in the last 72 hours. Thyroid Function Tests: No results for input(s): TSH, T4TOTAL, T3FREE, THYROIDAB in the last 72 hours.  Invalid input(s): FREET3  RADIOLOGY: Ct Abdomen Pelvis W Wo Contrast  05/26/2014   CLINICAL DATA:  Status post robotic radical prostatectomy 6 years ago with chronic painless hematuria, preop cardiac bypass on Friday  EXAM: CT  ABDOMEN AND PELVIS WITHOUT AND WITH CONTRAST  TECHNIQUE: Multidetector CT imaging of the abdomen and pelvis was performed following the standard protocol before and following the bolus administration of intravenous contrast.  CONTRAST:  171mL OMNIPAQUE IOHEXOL 300 MG/ML  SOLN  COMPARISON:  11/04/2009  FINDINGS: Lower chest: Emphysematous changes with dependent atelectasis at the lung bases.  Hepatobiliary: 3 mm cyst in the posterior segment right hepatic lobe (series 301/ image 12). No suspicious/enhancing hepatic lesions.  Layering gallbladder sludge and/or gallstones (series 31/ image 34). No intrahepatic or extrahepatic ductal dilatation.  Pancreas: Within normal limits.  Spleen: Within normal limits.  Adrenals/Urinary Tract: Adrenal glands are unremarkable.  4 mm cyst in the lateral left upper kidney. Right kidney is within normal limits. No enhancing renal lesions. No hydronephrosis.  No renal, ureteral, or bladder calculi.  Bladder is within normal limits.  Stomach/Bowel: Stomach is moderately distended but within normal limits.  No evidence of bowel obstruction.  Rectosigmoid colon is mildly thick-walled but underdistended.  Vascular/Lymphatic: Atherosclerotic calcifications of the abdominal aorta and branch vessels.  No suspicious abdominopelvic lymphadenopathy.  Reproductive: Status post  prostatectomy.  Other: No abdominopelvic ascites.  Tiny fat containing right inguinal hernia.  Musculoskeletal: Degenerative changes of the visualized thoracolumbar spine.  IMPRESSION: No renal, ureteral, or bladder calculi.  No hydronephrosis.  4 mm cyst in the left upper kidney.  No enhancing renal lesions.  Status post prostatectomy. No evidence of metastatic disease in the abdomen/pelvis.   Electronically Signed   By: Julian Hy M.D.   On: 05/26/2014 12:32   Dg Chest 2 View  05/31/2014   CLINICAL DATA:  Coronary artery disease post CABG, history COPD, MI, hyperlipidemia, smoker, prostate cancer  EXAM: CHEST  2  VIEW  COMPARISON:  05/30/2014  FINDINGS: Interval removal of RIGHT jugular line.  Upper normal heart size post CABG.  Atherosclerotic calcification aorta.  Mediastinal contours and pulmonary vascularity normal.  Emphysematous changes with bibasilar atelectasis and small pleural effusions.  Remaining lungs clear.  No pneumothorax or acute osseous findings.  IMPRESSION: Post CABG.  COPD changes with bibasilar atelectasis and small pleural effusions.   Electronically Signed   By: Lavonia Dana M.D.   On: 05/31/2014 08:07   Dg Chest Port 1 View  05/30/2014   CLINICAL DATA:  Status post CABG x3  EXAM: PORTABLE CHEST - 1 VIEW  COMPARISON:  05/29/2014  FINDINGS: Swan-Ganz catheter is been removed. The right jugular sheath remains. The right chest tube has been removed. Mediastinal drain is been removed. No pneumothorax is evident. Mild atelectatic appearing basilar opacities persist bilaterally.  IMPRESSION: Removal of mediastinal drain, right chest tube and SG catheter. Persistent mild atelectatic opacities   Electronically Signed   By: Andreas Newport M.D.   On: 05/30/2014 05:44   Dg Chest Port 1 View  05/29/2014   CLINICAL DATA:  CABG x4  EXAM: PORTABLE CHEST - 1 VIEW  COMPARISON:  05/28/2014  FINDINGS: There is a right chest tube appearing unchanged in position. There is a right jugular Swan-Ganz catheter extending well out the right lower lobe artery. Mediastinal drain noted. Endotracheal tube and nasogastric tube have been removed. Left chest tube has been removed. No pneumothorax is evident. Mild atelectatic appearing basilar opacities have partially cleared.  IMPRESSION: Removal of the endotracheal tube, nasogastric tube and left chest tube. Swan-Ganz catheter extends far out the right lower lobe pulmonary artery. Partial clearance of basilar opacities.   Electronically Signed   By: Andreas Newport M.D.   On: 05/29/2014 05:42   Dg Chest Port 1 View  05/28/2014   CLINICAL DATA:  Status post coronary  artery bypass grafting. Hypoxia.  EXAM: PORTABLE CHEST - 1 VIEW  COMPARISON:  May 20, 2014  FINDINGS: Patient is status post coronary artery bypass grafting. Endotracheal tube tip is 6.6 cm above the carina. The Swan-Ganz catheter tip is in the midportion of the right interlobar pulmonary artery. The tip is approximately 5 cm distal to the main right pulmonary artery. Nasogastric tube tip and side port are in the stomach. There is a chest tube on the right. Temporary pacemaker wires are attached to the right heart. No pneumothorax.  There is left lower lobe consolidation with small left effusion. There is subsegmental atelectasis in the right base. Heart size is normal. Pulmonary vascularity is normal. There is atherosclerotic change in the aorta.  IMPRESSION: Tube and catheter positions as described without pneumothorax. Note that the Swan-Ganz catheter tip is well into the right interlobar pulmonary artery with the tip approximately 5 cm distal to the right main pulmonary artery.  There is left lower lobe consolidation  with small left effusion. There is mild right base atelectasis.  Heart size is within normal limits.   Electronically Signed   By: Lowella Grip III M.D.   On: 05/28/2014 16:05   Dg Chest Port 1 View  05/21/2014   CLINICAL DATA:  Left arm burning around 430 this morning resolved in then came back at 2000 hr tonight. Left arm numbness intermittently.  EXAM: PORTABLE CHEST - 1 VIEW  COMPARISON:  05/07/2014  FINDINGS: Postoperative changes in the mediastinum. Normal heart size and pulmonary vascularity. No focal airspace disease or consolidation in the lungs. No blunting of costophrenic angles. No pneumothorax. Mediastinal contours appear intact.  IMPRESSION: No active disease.   Electronically Signed   By: Lucienne Capers M.D.   On: 05/21/2014 00:36   Dg Chest Port 1 View  05/07/2014   CLINICAL DATA:  Acute respiratory failure  EXAM: PORTABLE CHEST - 1 VIEW  COMPARISON:  05/06/2014   FINDINGS: Cardiac shadow is stable. Postsurgical changes are again seen. An endotracheal tube, nasogastric catheter and right jugular central venous line are again seen and stable in appearance. Minimal persistent changes in the bases are seen stable from the prior exam. No new focal abnormality is seen. No bony abnormality is noted.  IMPRESSION: Tubes and lines as described above.  No significant interval change from the previous day.   Electronically Signed   By: Inez Catalina M.D.   On: 05/07/2014 07:24   Dg Chest Port 1 View  05/06/2014   CLINICAL DATA:  Respiratory acidosis.  EXAM: PORTABLE CHEST - 1 VIEW  COMPARISON:  05/05/2014.  FINDINGS: Endotracheal tube, NG tube, right IJ line stable position. Prior CABG. Heart size stable. Bibasilar pulmonary infiltrates have improved slightly. No prominent pleural effusion. No pneumothorax. No acute bony abnormality.  IMPRESSION: 1. Lines and tubes in stable position. 2. Interim slight improvement of bibasilar pulmonary alveolar infiltrates consistent with partial clearing of pulmonary edema and/or pneumonia. 3. Prior CABG.  Heart size stable.   Electronically Signed   By: Marcello Moores  Register   On: 05/06/2014 07:48   Dg Chest Port 1 View  05/05/2014   CLINICAL DATA:  Acute respiratory failure  EXAM: PORTABLE CHEST - 1 VIEW  COMPARISON:  05/04/2014  FINDINGS: Cardiomediastinal silhouette is stable. Status post median sternotomy. Stable endotracheal and NG tube position. Right IJ central line is unchanged in position. Persistent mild congestion/pulmonary edema. Probable bilateral small pleural effusion with bilateral basilar atelectasis or infiltrate.  IMPRESSION: Stable support apparatus. Persistent mild congestion/pulmonary edema. Probable bilateral small pleural effusion with bilateral basilar atelectasis or infiltrate.   Electronically Signed   By: Lahoma Crocker M.D.   On: 05/05/2014 08:42   Dg Chest Port 1 View  05/04/2014   CLINICAL DATA:  74 year old male with  acute respiratory failure on a ventilator.  EXAM: PORTABLE CHEST - 1 VIEW  COMPARISON:  Chest x-ray 05/03/2014.  FINDINGS: An endotracheal tube is in place with tip 5.9 cm above the carina. There is a right-sided internal jugular central venous catheter with tip terminating in the distal superior vena cava. Transcutaneous pacer/defibrillator pads project over the mid and left hemithorax. A nasogastric tube is seen extending into the stomach, however, the tip of the nasogastric tube extends below the lower margin of the image. Lung volumes appear normal. There is cephalization of the pulmonary vasculature, indistinctness of the interstitial markings, and patchy airspace disease throughout the lungs bilaterally suggestive of moderate pulmonary edema. Small to moderate bilateral pleural effusions. Mild cardiomegaly.  The patient is rotated to the right on today's exam, resulting in distortion of the mediastinal contours and reduced diagnostic sensitivity and specificity for mediastinal pathology. Atherosclerosis in the thoracic aorta.  IMPRESSION: 1. Support apparatus, as above. 2. The appearance the chest suggests underlying congestive heart failure, as above. 3. Bibasilar opacities presumably reflect a combination of atelectasis with superimposed small to moderate bilateral pleural effusions, however, underlying airspace consolidation from infection or aspiration is not excluded.   Electronically Signed   By: Vinnie Langton M.D.   On: 05/04/2014 11:34   Dg Chest Port 1 View  05/03/2014   CLINICAL DATA:  ETT advanced.  Respiratory failure  EXAM: PORTABLE CHEST - 1 VIEW  COMPARISON:  05/02/2014  FINDINGS: The endotracheal tube tip is midway between the clavicular heads and the tracheal carina. The nasogastric tube extends well into the stomach and off the inferior edge of the image. Right jugular central line extends into the SVC about 1 cm below the azygos vein junction.  No pneumothorax is evident. No large  effusions are evident. Mild basilar opacities are present, partially cleared on the left where there formerly was a more confluent opacity.  IMPRESSION: Lines and tubes as described.  Slight improvement in the left base.   Electronically Signed   By: Andreas Newport M.D.   On: 05/03/2014 16:16     TELEMETRY: I have personally reviewed telemetry today June 01, 2014. There was sinus rhythm yesterday. He reverted to atrial fib early today. The rate is approximately 105.   ASSESSMENT AND PLAN:    PAF- on Amiodarone   Chronic anticoagulation-on Eliquis at discharge 05/11/14     The plan is to continue oral amiodarone. In my note yesterday I mentioned that the optimal therapy from the cardiac viewpoint would be Eliquis and plavix. However he has fallen in the hospital. At this point he is on aspirin. If it is felt that Eliquis cannot be used because of his fall risk,, it would be most appropriate to use aspirin and Plavix as he has had a recent coronary intervention.  Dola Argyle 06/01/2014 9:50 AM

## 2014-06-01 NOTE — Progress Notes (Signed)
Utilization review completed.  

## 2014-06-01 NOTE — Progress Notes (Signed)
CARDIAC REHAB PHASE I   PRE:  Rate/Rhythm: 108 ? aifb    BP: sitting 112/61    SaO2: 91 RA  MODE:  Ambulation: 150 ft   POST:  Rate/Rhythm: 135 afib after walk, 124 during    BP: sitting 165/39     SaO2: 97 RA   Pt tolerated better this pm. No major c/o. Had to sit and rest x1, HR 124 afib. To recliner after walking. Pt declined walking farther.  [phone]  [name] [place] CES, ACSM 06/01/2014 1:32 PM

## 2014-06-01 NOTE — Evaluation (Signed)
Physical Therapy Evaluation Patient Details Name: [name] MRN: [id number] DOB: 06/12/1940 Today's Date: 06/01/2014   History of Present Illness  74 y.o. male s/p REDO CORONARY ARTERY BYPASS GRAFTING   Clinical Impression  Pt seen following the above procedure. Pt currently with functional limitations due to the deficits listed below (see PT Problem List). Needs frequent cues to safely maintain sternal precautions. Pt became dizzy after ambulating only 30 feet today and needed assistance to safely sit on rollator seat. HR briefly elevated to 148 but returned to 90s upon sitting on bed in room. RN notified and aware. Pt will benefit from skilled PT to increase their independence and safety with mobility to allow discharge to the venue listed below.       Follow Up Recommendations SNF    Equipment Recommendations  None recommended by PT    Recommendations for Other Services       Precautions / Restrictions Precautions Precautions: Fall;Sternal Precaution Comments: Reviewed sternal precautions. Restrictions Weight Bearing Restrictions: Yes Other Position/Activity Restrictions: Sternal      Mobility  Bed Mobility Overal bed mobility: Needs Assistance Bed Mobility: Sit to Sidelying;Rolling Rolling: Supervision       Sit to sidelying: Min assist General bed mobility comments: educated on log roll technique. Min assist for LE support with cues to maintain Sternal precautions.  Transfers Overall transfer level: Needs assistance Equipment used: 4-wheeled walker Transfers: Sit to/from Stand Sit to Stand: Min assist         General transfer comment: Min assist for boost to stand from reclining chair and rollator. Max cues for sternal precautions.  Ambulation/Gait Ambulation/Gait assistance: Min assist Ambulation Distance (Feet): 30 Feet (x2) Assistive device: 4-wheeled walker Gait Pattern/deviations: Step-through pattern;Decreased stride length;Trunk  flexed Gait velocity: decreased   General Gait Details: Ambulted at min guard assist up to 30 feet before pt became very dizzy and needed to sit in rollator immediately. Require min assist to sit safely with max cues for sternal precautions. Impulsive to stand despite PT telling patient to sit while he felt dizzy, but pt disregards warnings and ambulated back to room with close assist. HR elevated to 120s intially then climbed briefly to 140s just before sitting, at which point it returned to 90s while resting.  Stairs            Wheelchair Mobility    Modified Rankin (Stroke Patients Only)       Balance Overall balance assessment: Needs assistance Sitting-balance support: No upper extremity supported;Feet supported Sitting balance-Leahy Scale: Fair     Standing balance support: Single extremity supported Standing balance-Leahy Scale: Poor                               Pertinent Vitals/Pain Pain Assessment: 0-10 Pain Score: 5  Pain Location: lower back - has hurt for the past 30 years Pain Descriptors / Indicators: Sharp Pain Intervention(s): Monitored during session;Repositioned    Home Living Family/patient expects to be discharged to:: Private residence Living Arrangements: Spouse/significant other Available Help at Discharge: Family;Available PRN/intermittently (sons live nearby - cares for his wife) Type of Home: House Home Access: Stairs to enter Entrance Stairs-Rails: None Entrance Stairs-Number of Steps: 1 Home Layout: Two level;Able to live on main level with bedroom/bathroom Home Equipment: Kasandra Knudsen - single point;Walker - 4 wheels;Tub bench      Prior Function Level of Independence: Independent with assistive device(s)  Comments: Used cane for mobility.      Hand Dominance   Dominant Hand: Right    Extremity/Trunk Assessment   Upper Extremity Assessment: Defer to OT evaluation           Lower Extremity Assessment:  Generalized weakness         Communication   Communication: No difficulties  Cognition Arousal/Alertness: Awake/alert Behavior During Therapy: WFL for tasks assessed/performed Overall Cognitive Status: Within Functional Limits for tasks assessed                      General Comments General comments (skin integrity, edema, etc.): Dizziness after ambulating. Needs close supervision and cues frequently for sternal preacutions    Exercises        Assessment/Plan    PT Assessment Patient needs continued PT services  PT Diagnosis Difficulty walking;Abnormality of gait;Generalized weakness;Acute pain   PT Problem List Decreased strength;Decreased range of motion;Decreased activity tolerance;Decreased balance;Decreased mobility;Decreased knowledge of use of DME;Decreased safety awareness;Decreased knowledge of precautions;Cardiopulmonary status limiting activity;Pain  PT Treatment Interventions DME instruction;Gait training;Functional mobility training;Therapeutic activities;Therapeutic exercise;Balance training;Neuromuscular re-education;Patient/family education;Modalities   PT Goals (Current goals can be found in the Care Plan section) Acute Rehab PT Goals Patient Stated Goal: Go home PT Goal Formulation: With patient Time For Goal Achievement: 06/15/14 Potential to Achieve Goals: Good    Frequency Min 3X/week   Barriers to discharge Decreased caregiver support cares for his wife at home    Co-evaluation               End of Session Equipment Utilized During Treatment: Gait belt Activity Tolerance: Other (comment) (Limited by dizziness and elevated HR) Patient left: with call bell/phone within reach;in bed;with nursing/sitter in room Nurse Communication: Mobility status;Other (comment) (Pacing box disconnected. Was already turned off.)         Time: 1033-1056 PT Time Calculation (min) (ACUTE ONLY): 23 min   Charges:   PT Evaluation $Initial PT  Evaluation Tier I: 1 Procedure PT Treatments $Therapeutic Activity: 8-22 mins   PT G Codes:        Ellouise Newer 06/01/2014, 11:59 AM Elayne Snare, [place]

## 2014-06-01 NOTE — Progress Notes (Addendum)
[address]       [place] [address]             [phone]      4 Days Post-Op Procedure(s) (LRB): REDO CORONARY ARTERY BYPASS GRAFTING (CABG) (N/A) TRANSESOPHAGEAL ECHOCARDIOGRAM (TEE) (N/A) Subjective: Feels ok, some right shoulder discomfort from the fall, In afib- HR elevated at times  Objective: Vital signs in last 24 hours: Temp:  [97.8 F (36.6 C)-98.4 F (36.9 C)] 97.9 F (36.6 C) (02/23 0513) Pulse Rate:  [61-107] 107 (02/23 0513) Cardiac Rhythm:  [-] Atrial fibrillation (02/23 0513) Resp:  [18-20] 18 (02/23 0513) BP: (112-146)/(44-59) 112/59 mmHg (02/23 0513) SpO2:  [90 %-93 %] 93 % (02/23 0513) Weight:  [163 lb 4.8 oz (74.072 kg)] 163 lb 4.8 oz (74.072 kg) (02/23 0329)  Hemodynamic parameters for last 24 hours:    Intake/Output from previous day: 02/22 0701 - 02/23 0700 In: 240 [P.O.:240] Out: -  Intake/Output this shift:    General appearance: alert, cooperative and no distress Heart: irregularly irregular rhythm Lungs: clear to auscultation bilaterally Abdomen: benign Extremities: no edema Wound: incis healing well  Lab Results:  Recent Labs  05/30/14 0340 05/31/14 0500  WBC 4.9 6.5  HGB 7.6* 7.8*  HCT 22.7* 22.9*  PLT 71* 91*   BMET:  Recent Labs  05/30/14 0340 05/31/14 0500  NA 135 130*  K 4.3 3.9  CL 105 100  CO2 27 25  GLUCOSE 112* 120*  BUN 13 12  CREATININE 0.91 0.81  CALCIUM 8.1* 8.1*    PT/INR: No results for input(s): LABPROT, INR in the last 72 hours. ABG    Component Value Date/Time   PHART 7.354 05/29/2014 0002   HCO3 22.6 05/29/2014 0002   TCO2 22 05/29/2014 1625   ACIDBASEDEF 3.0* 05/29/2014 0002   O2SAT 96.0 05/29/2014 0002   CBG (last 3)   Recent Labs  05/30/14 0005 05/30/14 0345 05/30/14 0808  GLUCAP 112* 118* 104*    Meds Scheduled Meds: . amiodarone  300 mg Oral BID  . antiseptic oral rinse  7 mL Mouth Rinse BID  . aspirin EC  81 mg Oral Daily  . atorvastatin  40 mg  Oral q1800  . carvedilol  3.125 mg Oral BID WC  . docusate sodium  200 mg Oral Daily  . ferrous gluconate  324 mg Oral BID WC  . folic acid  1 mg Oral Daily  . moving right along book   Does not apply Once  . pantoprazole  40 mg Oral QAC breakfast  . sodium chloride  3 mL Intravenous Q12H   Continuous Infusions:  PRN Meds:.sodium chloride, bisacodyl **OR** bisacodyl, ondansetron **OR** ondansetron (ZOFRAN) IV, oxyCODONE, sodium chloride, traMADol  Xrays Dg Chest 2 View  05/31/2014   CLINICAL DATA:  Coronary artery disease post CABG, history COPD, MI, hyperlipidemia, smoker, prostate cancer  EXAM: CHEST  2 VIEW  COMPARISON:  05/30/2014  FINDINGS: Interval removal of RIGHT jugular line.  Upper normal heart size post CABG.  Atherosclerotic calcification aorta.  Mediastinal contours and pulmonary vascularity normal.  Emphysematous changes with bibasilar atelectasis and small pleural effusions.  Remaining lungs clear.  No pneumothorax or acute osseous findings.  IMPRESSION: Post CABG.  COPD changes with bibasilar atelectasis and small pleural effusions.   Electronically Signed   By: Lavonia Dana M.D.   On: 05/31/2014 08:07    Assessment/Plan: S/P Procedure(s) (LRB): REDO CORONARY ARTERY BYPASS GRAFTING (CABG) (N/A) TRANSESOPHAGEAL ECHOCARDIOGRAM (TEE) (N/A)  1 afib, on amio. , will increase coreg, start low dose ace 2 cont pulm toilet 3 cont rehab 4 repeat labs in am   LOS: 11 days    GOLD,WAYNE E 06/01/2014  Back in afib today Fell yesterday , without sequale On ASA, consider adding Plavix  Vs coumadin hgb stable to up slightly   Will add plavix for now I have seen and examined [place] and agree with the above assessment  and plan.  Grace Isaac MD [facility] [phone] Office [phone] 06/01/2014 12:46 PM

## 2014-06-01 NOTE — Progress Notes (Signed)
FOLLOW-UP NUTRITION ASSESSMENT  DOCUMENTATION CODES Per approved criteria  -Severe malnutrition in the context of acute illness or injury   INTERVENTION: Recommend liberalizing diet Continue Ensure Complete po BID, each supplement provides 350 kcal and 13 grams of protein Provide Multivitamin with minerals daily Encourage PO intake  NUTRITION DIAGNOSIS: Inadequate oral intake related to varied appetite as evidenced by meal completion of 40-100%; ongoing  Goal: Pt to meet >/= 90% of their estimated nutrition needs; unmet  Monitor:  PO intake, weight trends, labs, I/O's   ASSESSMENT: Pt with hx of atrial fibrillation, HTN, HLD, CAD s/p CABG 1993, PVD, and recent admission for STEMI on 04/29/14 presents with an NSTEMI in setting of atrial flutter with RVR.  Patient is now 4 days s/p surgery for CABG and TEE. Per nursing notes, pt is eating 50-60% of meals. Patient reports eating 75% of breakfast this morning but, his appetite continues to be poor. RD provided diet tips to help with constipation. His weight has trended up 5 lbs in the past week. Ensure Complete was discontinued for surgery; RD re-ordered today. Patient had vanilla Ensure at bedside which was untouched states he only likes chocolate and strawberry.   Labs and medications reviewed. Low hemoglobin, low sodium, low calcium  Height: Ht Readings from Last 1 Encounters:  05/26/14 5\' 11"  (1.803 m)    Weight: Wt Readings from Last 1 Encounters:  06/01/14 163 lb 4.8 oz (74.072 kg)    BMI:  Body mass index is 22.79 kg/(m^2).  Re-estimated Nutritional Needs: Kcal: 3267-1245 Protein: 90-100 grams Fluid: 1.9 - 2 L/day  Skin: + 1 generalized edema  Diet Order: Diet heart healthy/carb modified    Intake/Output Summary (Last 24 hours) at 06/01/14 1206 Last data filed at 06/01/14 0730  Gross per 24 hour  Intake    480 ml  Output      0 ml  Net    480 ml    Last BM: 2/12 per pt report  Labs:   Recent  Labs Lab 05/28/14 2217 05/29/14 0431 05/29/14 1625 05/29/14 1800 05/30/14 0340 05/31/14 0500  NA  --  138 137  --  135 130*  K  --  4.1 4.3  --  4.3 3.9  CL  --  108 100  --  105 100  CO2  --  26  --   --  27 25  BUN  --  8 11  --  13 12  CREATININE 0.81 0.81 0.90 0.97 0.91 0.81  CALCIUM  --  8.0*  --   --  8.1* 8.1*  MG 2.8* 2.6*  --  2.6*  --   --   GLUCOSE  --  106* 113*  --  112* 120*    CBG (last 3)   Recent Labs  05/30/14 0005 05/30/14 0345 05/30/14 0808  GLUCAP 112* 118* 104*    Scheduled Meds: . amiodarone  300 mg Oral BID  . antiseptic oral rinse  7 mL Mouth Rinse BID  . aspirin EC  81 mg Oral Daily  . atorvastatin  40 mg Oral q1800  . carvedilol  6.25 mg Oral BID WC  . docusate sodium  200 mg Oral Daily  . feeding supplement (ENSURE COMPLETE)  237 mL Oral BID BM  . ferrous gluconate  324 mg Oral BID WC  . folic acid  1 mg Oral Daily  . lisinopril  2.5 mg Oral Daily  . moving right along book   Does not apply Once  .  pantoprazole  40 mg Oral QAC breakfast  . sodium chloride  3 mL Intravenous Q12H    Continuous Infusions:    Pryor Ochoa RD, LDN Inpatient Clinical Dietitian Pager: [phone] After Hours Pager: [phone]

## 2014-06-02 LAB — BASIC METABOLIC PANEL
Anion gap: 6 (ref 5–15)
BUN: 10 mg/dL (ref 6–23)
CO2: 28 mmol/L (ref 19–32)
CREATININE: 0.82 mg/dL (ref 0.50–1.35)
Calcium: 8.3 mg/dL — ABNORMAL LOW (ref 8.4–10.5)
Chloride: 98 mmol/L (ref 96–112)
GFR calc Af Amer: >90 mL/min (ref >90)
GFR, EST NON AFRICAN AMERICAN: 84 mL/min — AB (ref >90)
GLUCOSE: 115 mg/dL — AB (ref 70–99)
POTASSIUM: 4.1 mmol/L (ref 3.5–5.1)
Sodium: 132 mmol/L — ABNORMAL LOW (ref 135–145)

## 2014-06-02 LAB — CBC
HEMATOCRIT: 23.5 % — AB (ref 39.0–52.0)
Hemoglobin: 8.1 g/dL — ABNORMAL LOW (ref 13.0–17.0)
MCH: 32 pg (ref 26.0–34.0)
MCHC: 34.5 g/dL (ref 30.0–36.0)
MCV: 92.9 fL (ref 78.0–100.0)
Platelets: 154 10*3/uL (ref 150–400)
RBC: 2.53 MIL/uL — ABNORMAL LOW (ref 4.22–5.81)
RDW: 15.5 % (ref 11.5–15.5)
WBC: 5.5 10*3/uL (ref 4.0–10.5)

## 2014-06-02 MED ORDER — DILTIAZEM HCL 60 MG PO TABS
60.0000 mg | ORAL_TABLET | Freq: Four times a day (QID) | ORAL | Status: DC
  Administered 2014-06-02 – 2014-06-03 (×3): 60 mg via ORAL
  Filled 2014-06-02 (×8): qty 1

## 2014-06-02 MED ORDER — METOPROLOL TARTRATE 25 MG PO TABS
25.0000 mg | ORAL_TABLET | Freq: Two times a day (BID) | ORAL | Status: DC
  Administered 2014-06-02: 25 mg via ORAL
  Filled 2014-06-02 (×4): qty 1

## 2014-06-02 MED ORDER — APIXABAN 5 MG PO TABS
5.0000 mg | ORAL_TABLET | Freq: Two times a day (BID) | ORAL | Status: DC
  Administered 2014-06-02 (×2): 5 mg via ORAL
  Filled 2014-06-02 (×4): qty 1

## 2014-06-02 MED ORDER — MAGNESIUM HYDROXIDE 400 MG/5ML PO SUSP
30.0000 mL | Freq: Every day | ORAL | Status: DC | PRN
  Administered 2014-06-02: 30 mL via ORAL
  Filled 2014-06-02: qty 30

## 2014-06-02 NOTE — Discharge Instructions (Addendum)

## 2014-06-02 NOTE — Progress Notes (Addendum)
SUBJECTIVE: The patient's rate continues to be elevated. It appears that he may have atrial flutter at times. He is continuing to receive amiodarone load. It has been mentioned that he has a fall risk. However I feel that he needs to be anticoagulated at this time. He has gone in and out of atrial fib and atrial flutter. I'm hoping he will convert back to sinus rhythm so that we will have better rate control. However, if he does not convert, we may need to consider cardioversion before he leaves the hospital. In this case, we will definitely need to have him anticoagulated. I am assuming that he had his left atrial appendage tied off in some capacity with his surgery. Consideration for other medications for rate control would include increased beta-blockade, diltiazem, digoxin. Ejection fraction before surgery was in the range of 45%. He should be able to tolerate diltiazem.   Filed Vitals:   06/01/14 1335 06/01/14 1658 06/01/14 1958 06/02/14 0400  BP: 165/39 129/60 101/53 109/53  Pulse: 80 88 81 115  Temp: 98 F (36.7 C)  97.6 F (36.4 C) 97.4 F (36.3 C)  TempSrc: Oral  Oral Oral  Resp: 18  18 22   Height:      Weight:    162 lb 0.6 oz (73.5 kg)  SpO2: 98%  94% 91%     Intake/Output Summary (Last 24 hours) at 06/02/14 0813 Last data filed at 06/01/14 1939  Gross per 24 hour  Intake    360 ml  Output    200 ml  Net    160 ml    LABS: Basic Metabolic Panel:  Recent Labs  05/31/14 0500 06/02/14 0426  NA 130* 132*  K 3.9 4.1  CL 100 98  CO2 25 28  GLUCOSE 120* 115*  BUN 12 10  CREATININE 0.81 0.82  CALCIUM 8.1* 8.3*   Liver Function Tests: No results for input(s): AST, ALT, ALKPHOS, BILITOT, PROT, ALBUMIN in the last 72 hours. No results for input(s): LIPASE, AMYLASE in the last 72 hours. CBC:  Recent Labs  05/31/14 0500 06/02/14 0426  WBC 6.5 5.5  HGB 7.8* 8.1*  HCT 22.9* 23.5*  MCV 92.0 92.9  PLT 91* 154   Cardiac Enzymes: No results for input(s):  CKTOTAL, CKMB, CKMBINDEX, TROPONINI in the last 72 hours. BNP: Invalid input(s): POCBNP D-Dimer: No results for input(s): DDIMER in the last 72 hours. Hemoglobin A1C: No results for input(s): HGBA1C in the last 72 hours. Fasting Lipid Panel: No results for input(s): CHOL, HDL, LDLCALC, TRIG, CHOLHDL, LDLDIRECT in the last 72 hours. Thyroid Function Tests: No results for input(s): TSH, T4TOTAL, T3FREE, THYROIDAB in the last 72 hours.  Invalid input(s): FREET3  RADIOLOGY: Ct Abdomen Pelvis W Wo Contrast  05/26/2014   CLINICAL DATA:  Status post robotic radical prostatectomy 6 years ago with chronic painless hematuria, preop cardiac bypass on Friday  EXAM: CT ABDOMEN AND PELVIS WITHOUT AND WITH CONTRAST  TECHNIQUE: Multidetector CT imaging of the abdomen and pelvis was performed following the standard protocol before and following the bolus administration of intravenous contrast.  CONTRAST:  161mL OMNIPAQUE IOHEXOL 300 MG/ML  SOLN  COMPARISON:  11/04/2009  FINDINGS: Lower chest: Emphysematous changes with dependent atelectasis at the lung bases.  Hepatobiliary: 3 mm cyst in the posterior segment right hepatic lobe (series 301/ image 12). No suspicious/enhancing hepatic lesions.  Layering gallbladder sludge and/or gallstones (series 31/ image 34). No intrahepatic or extrahepatic ductal dilatation.  Pancreas: Within normal limits.  Spleen: Within normal limits.  Adrenals/Urinary Tract: Adrenal glands are unremarkable.  4 mm cyst in the lateral left upper kidney. Right kidney is within normal limits. No enhancing renal lesions. No hydronephrosis.  No renal, ureteral, or bladder calculi.  Bladder is within normal limits.  Stomach/Bowel: Stomach is moderately distended but within normal limits.  No evidence of bowel obstruction.  Rectosigmoid colon is mildly thick-walled but underdistended.  Vascular/Lymphatic: Atherosclerotic calcifications of the abdominal aorta and branch vessels.  No suspicious  abdominopelvic lymphadenopathy.  Reproductive: Status post prostatectomy.  Other: No abdominopelvic ascites.  Tiny fat containing right inguinal hernia.  Musculoskeletal: Degenerative changes of the visualized thoracolumbar spine.  IMPRESSION: No renal, ureteral, or bladder calculi.  No hydronephrosis.  4 mm cyst in the left upper kidney.  No enhancing renal lesions.  Status post prostatectomy. No evidence of metastatic disease in the abdomen/pelvis.   Electronically Signed   By: Julian Hy M.D.   On: 05/26/2014 12:32   Dg Chest 2 View  05/31/2014   CLINICAL DATA:  Coronary artery disease post CABG, history COPD, MI, hyperlipidemia, smoker, prostate cancer  EXAM: CHEST  2 VIEW  COMPARISON:  05/30/2014  FINDINGS: Interval removal of RIGHT jugular line.  Upper normal heart size post CABG.  Atherosclerotic calcification aorta.  Mediastinal contours and pulmonary vascularity normal.  Emphysematous changes with bibasilar atelectasis and small pleural effusions.  Remaining lungs clear.  No pneumothorax or acute osseous findings.  IMPRESSION: Post CABG.  COPD changes with bibasilar atelectasis and small pleural effusions.   Electronically Signed   By: Lavonia Dana M.D.   On: 05/31/2014 08:07   Dg Chest Port 1 View  05/30/2014   CLINICAL DATA:  Status post CABG x3  EXAM: PORTABLE CHEST - 1 VIEW  COMPARISON:  05/29/2014  FINDINGS: Swan-Ganz catheter is been removed. The right jugular sheath remains. The right chest tube has been removed. Mediastinal drain is been removed. No pneumothorax is evident. Mild atelectatic appearing basilar opacities persist bilaterally.  IMPRESSION: Removal of mediastinal drain, right chest tube and SG catheter. Persistent mild atelectatic opacities   Electronically Signed   By: Andreas Newport M.D.   On: 05/30/2014 05:44   Dg Chest Port 1 View  05/29/2014   CLINICAL DATA:  CABG x4  EXAM: PORTABLE CHEST - 1 VIEW  COMPARISON:  05/28/2014  FINDINGS: There is a right chest tube  appearing unchanged in position. There is a right jugular Swan-Ganz catheter extending well out the right lower lobe artery. Mediastinal drain noted. Endotracheal tube and nasogastric tube have been removed. Left chest tube has been removed. No pneumothorax is evident. Mild atelectatic appearing basilar opacities have partially cleared.  IMPRESSION: Removal of the endotracheal tube, nasogastric tube and left chest tube. Swan-Ganz catheter extends far out the right lower lobe pulmonary artery. Partial clearance of basilar opacities.   Electronically Signed   By: Andreas Newport M.D.   On: 05/29/2014 05:42   Dg Chest Port 1 View  05/28/2014   CLINICAL DATA:  Status post coronary artery bypass grafting. Hypoxia.  EXAM: PORTABLE CHEST - 1 VIEW  COMPARISON:  May 20, 2014  FINDINGS: Patient is status post coronary artery bypass grafting. Endotracheal tube tip is 6.6 cm above the carina. The Swan-Ganz catheter tip is in the midportion of the right interlobar pulmonary artery. The tip is approximately 5 cm distal to the main right pulmonary artery. Nasogastric tube tip and side port are in the stomach. There is a chest tube on the  right. Temporary pacemaker wires are attached to the right heart. No pneumothorax.  There is left lower lobe consolidation with small left effusion. There is subsegmental atelectasis in the right base. Heart size is normal. Pulmonary vascularity is normal. There is atherosclerotic change in the aorta.  IMPRESSION: Tube and catheter positions as described without pneumothorax. Note that the Swan-Ganz catheter tip is well into the right interlobar pulmonary artery with the tip approximately 5 cm distal to the right main pulmonary artery.  There is left lower lobe consolidation with small left effusion. There is mild right base atelectasis.  Heart size is within normal limits.   Electronically Signed   By: Lowella Grip III M.D.   On: 05/28/2014 16:05   Dg Chest Port 1  View  05/21/2014   CLINICAL DATA:  Left arm burning around 430 this morning resolved in then came back at 2000 hr tonight. Left arm numbness intermittently.  EXAM: PORTABLE CHEST - 1 VIEW  COMPARISON:  05/07/2014  FINDINGS: Postoperative changes in the mediastinum. Normal heart size and pulmonary vascularity. No focal airspace disease or consolidation in the lungs. No blunting of costophrenic angles. No pneumothorax. Mediastinal contours appear intact.  IMPRESSION: No active disease.   Electronically Signed   By: Lucienne Capers M.D.   On: 05/21/2014 00:36   Dg Chest Port 1 View  05/07/2014   CLINICAL DATA:  Acute respiratory failure  EXAM: PORTABLE CHEST - 1 VIEW  COMPARISON:  05/06/2014  FINDINGS: Cardiac shadow is stable. Postsurgical changes are again seen. An endotracheal tube, nasogastric catheter and right jugular central venous line are again seen and stable in appearance. Minimal persistent changes in the bases are seen stable from the prior exam. No new focal abnormality is seen. No bony abnormality is noted.  IMPRESSION: Tubes and lines as described above.  No significant interval change from the previous day.   Electronically Signed   By: Inez Catalina M.D.   On: 05/07/2014 07:24   Dg Chest Port 1 View  05/06/2014   CLINICAL DATA:  Respiratory acidosis.  EXAM: PORTABLE CHEST - 1 VIEW  COMPARISON:  05/05/2014.  FINDINGS: Endotracheal tube, NG tube, right IJ line stable position. Prior CABG. Heart size stable. Bibasilar pulmonary infiltrates have improved slightly. No prominent pleural effusion. No pneumothorax. No acute bony abnormality.  IMPRESSION: 1. Lines and tubes in stable position. 2. Interim slight improvement of bibasilar pulmonary alveolar infiltrates consistent with partial clearing of pulmonary edema and/or pneumonia. 3. Prior CABG.  Heart size stable.   Electronically Signed   By: Marcello Moores  Register   On: 05/06/2014 07:48   Dg Chest Port 1 View  05/05/2014   CLINICAL DATA:  Acute  respiratory failure  EXAM: PORTABLE CHEST - 1 VIEW  COMPARISON:  05/04/2014  FINDINGS: Cardiomediastinal silhouette is stable. Status post median sternotomy. Stable endotracheal and NG tube position. Right IJ central line is unchanged in position. Persistent mild congestion/pulmonary edema. Probable bilateral small pleural effusion with bilateral basilar atelectasis or infiltrate.  IMPRESSION: Stable support apparatus. Persistent mild congestion/pulmonary edema. Probable bilateral small pleural effusion with bilateral basilar atelectasis or infiltrate.   Electronically Signed   By: Lahoma Crocker M.D.   On: 05/05/2014 08:42   Dg Chest Port 1 View  05/04/2014   CLINICAL DATA:  74 year old male with acute respiratory failure on a ventilator.  EXAM: PORTABLE CHEST - 1 VIEW  COMPARISON:  Chest x-ray 05/03/2014.  FINDINGS: An endotracheal tube is in place with tip 5.9 cm above the  carina. There is a right-sided internal jugular central venous catheter with tip terminating in the distal superior vena cava. Transcutaneous pacer/defibrillator pads project over the mid and left hemithorax. A nasogastric tube is seen extending into the stomach, however, the tip of the nasogastric tube extends below the lower margin of the image. Lung volumes appear normal. There is cephalization of the pulmonary vasculature, indistinctness of the interstitial markings, and patchy airspace disease throughout the lungs bilaterally suggestive of moderate pulmonary edema. Small to moderate bilateral pleural effusions. Mild cardiomegaly. The patient is rotated to the right on today's exam, resulting in distortion of the mediastinal contours and reduced diagnostic sensitivity and specificity for mediastinal pathology. Atherosclerosis in the thoracic aorta.  IMPRESSION: 1. Support apparatus, as above. 2. The appearance the chest suggests underlying congestive heart failure, as above. 3. Bibasilar opacities presumably reflect a combination of  atelectasis with superimposed small to moderate bilateral pleural effusions, however, underlying airspace consolidation from infection or aspiration is not excluded.   Electronically Signed   By: Vinnie Langton M.D.   On: 05/04/2014 11:34   Dg Chest Port 1 View  05/03/2014   CLINICAL DATA:  ETT advanced.  Respiratory failure  EXAM: PORTABLE CHEST - 1 VIEW  COMPARISON:  05/02/2014  FINDINGS: The endotracheal tube tip is midway between the clavicular heads and the tracheal carina. The nasogastric tube extends well into the stomach and off the inferior edge of the image. Right jugular central line extends into the SVC about 1 cm below the azygos vein junction.  No pneumothorax is evident. No large effusions are evident. Mild basilar opacities are present, partially cleared on the left where there formerly was a more confluent opacity.  IMPRESSION: Lines and tubes as described.  Slight improvement in the left base.   Electronically Signed   By: Andreas Newport M.D.   On: 05/03/2014 16:16    PHYSICAL EXAM  patient is stable today. He is oriented to person time and place. Affect is normal. Cardiac exam reveals an S1 and S2.   TELEMETRY: I have reviewed telemetry today June 02, 2014. At times the rhythm is regular with a rate of 120. I suspect this is atrial flutter.   ASSESSMENT AND PLAN:     CAD S/P LM and CFX PCI 05/04/14   S/P CABG     The patient is currently status post CABG this admission. Despite his fall risk, as of today I feel he needs to be on Eliquis and Plavix so that we can be sure that we can proceed with cardioversion if needed.        PAF- on Amiodarone     Amiodarone load is continued. Today I will add diltiazem for better rate control. In addition I will start Eliquis as he may need to be cardioverted at some point. I am aware that there is a fall risk. We will have to be as careful as possible with him.    Chronic anticoagulation-on Eliquis at discharge 05/11/14     Hematuria    I am hoping that his hematuria was an isolated event and that we will not have any more problems as we had anticoagulation.   [name] 06/02/2014 8:13 AM

## 2014-06-02 NOTE — Progress Notes (Addendum)
[address]       [place] [address]             [phone]      5 Days Post-Op Procedure(s) (LRB): REDO CORONARY ARTERY BYPASS GRAFTING (CABG) (N/A) TRANSESOPHAGEAL ECHOCARDIOGRAM (TEE) (N/A) Subjective: afib with RVR at times, feels constipated, + Flatus  Objective: Vital signs in last 24 hours: Temp:  [97.4 F (36.3 C)-98 F (36.7 C)] 97.4 F (36.3 C) (02/24 0400) Pulse Rate:  [80-115] 115 (02/24 0400) Cardiac Rhythm:  [-] Atrial fibrillation (02/24 0809) Resp:  [18-22] 22 (02/24 0400) BP: (101-165)/(39-60) 109/53 mmHg (02/24 0400) SpO2:  [91 %-98 %] 91 % (02/24 0400) Weight:  [162 lb 0.6 oz (73.5 kg)] 162 lb 0.6 oz (73.5 kg) (02/24 0400)  Hemodynamic parameters for last 24 hours:    Intake/Output from previous day: 02/23 0701 - 02/24 0700 In: 600 [P.O.:600] Out: 200 [Urine:200] Intake/Output this shift:    General appearance: alert, cooperative and no distress Heart: irregularly irregular rhythm Lungs: clear to auscultation bilaterally Abdomen: mild distension, + BS, nontender Extremities: minor edema Wound: incis healing well  Lab Results:  Recent Labs  05/31/14 0500 06/02/14 0426  WBC 6.5 5.5  HGB 7.8* 8.1*  HCT 22.9* 23.5*  PLT 91* 154   BMET:   Recent Labs  05/31/14 0500 06/02/14 0426  NA 130* 132*  K 3.9 4.1  CL 100 98  CO2 25 28  GLUCOSE 120* 115*  BUN 12 10  CREATININE 0.81 0.82  CALCIUM 8.1* 8.3*    PT/INR: No results for input(s): LABPROT, INR in the last 72 hours. ABG    Component Value Date/Time   PHART 7.354 05/29/2014 0002   HCO3 22.6 05/29/2014 0002   TCO2 22 05/29/2014 1625   ACIDBASEDEF 3.0* 05/29/2014 0002   O2SAT 96.0 05/29/2014 0002   CBG (last 3)  No results for input(s): GLUCAP in the last 72 hours.  Meds Scheduled Meds: . amiodarone  300 mg Oral BID  . antiseptic oral rinse  7 mL Mouth Rinse BID  . apixaban  5 mg Oral BID  . atorvastatin  40 mg Oral q1800  . diltiazem  60 mg  Oral 4 times per day  . docusate sodium  200 mg Oral Daily  . feeding supplement (ENSURE COMPLETE)  237 mL Oral BID BM  . ferrous gluconate  324 mg Oral BID WC  . folic acid  1 mg Oral Daily  . lisinopril  2.5 mg Oral Daily  . metoprolol tartrate  25 mg Oral BID  . moving right along book   Does not apply Once  . multivitamin with minerals  1 tablet Oral Daily  . pantoprazole  40 mg Oral QAC breakfast  . sodium chloride  3 mL Intravenous Q12H   Continuous Infusions:  PRN Meds:.sodium chloride, bisacodyl **OR** bisacodyl, magnesium hydroxide, ondansetron **OR** ondansetron (ZOFRAN) IV, oxyCODONE, sodium chloride, traMADol  Xrays No results found.  Assessment/Plan: S/P Procedure(s) (LRB): REDO CORONARY ARTERY BYPASS GRAFTING (CABG) (N/A) TRANSESOPHAGEAL ECHOCARDIOGRAM (TEE) (N/A)  1 afib remains an issue- conts coreg/amio/plavix/asa, cardiology assisting with management 2 labs stable, platelets improved 3 push rehab as able  LOS: 12 days    Jermario Kalmar B 06/02/2014  AFIB/flutter remains a problem, now on eliquis 5mg  po bid, will need to be held to get pacer wires out With risk of fall will and bleeding will not have on triple therapy , stop plavix I have seen and examined Hollice Espy  C Trick and agree with the above assessment  and plan.  Grace Isaac MD Beeper [phone] Office [phone] 06/02/2014 11:32 AM

## 2014-06-02 NOTE — Clinical Social Work Note (Signed)
CSW spoke with patient concerning recommendation for SNF by physical therapy- patient would prefer not to go but will consider if it is strongly recommended by the care team.  Patient does not wish to make a decision about his disposition until his condition is more clear.  CSW will continue to follow.  Domenica Reamer, [place] Social Worker [phone]

## 2014-06-02 NOTE — Progress Notes (Signed)
Patient ambulated 75 ft when starting to feel dizzy, patient was brought back to his room. RN assessed vitals BP 80/52. Reassessed patient and performed orthostatic vitals BP lying; 124/60 HR 88, sitting; 94/61 HR 82, standing; 101/52 HR 83. Patient has had a previous fall during this admission, bed alarm is on, call bell with in reach, patient educated on fall risk and to call for help when needed. RN will continue to monitor and do hourly roundings.      Candise Che RN

## 2014-06-02 NOTE — Progress Notes (Signed)
Patient attempted to go for a walk and started to get dizzy as soon as we exited the room. returned patient to room in chair. BP low at 91/35, HR 81. Will continue to monitor. Reminded patient not to try to get up without assistance due to dizziness and recent fall.

## 2014-06-02 NOTE — Progress Notes (Signed)
Pharmacy: Apixaban  73yom on apixaban pta for afib, transitioned to IV heparin on admit for r/o ACS. He is now s/p redo CABG (2/19) and having post-op afib with difficult to control heart rate. Apixaban to resume in case he needs a cardioversion. He does not meet any requirements for dose reduction.   Plan: 1) Resume apixaban 5mg  bid 2) Follow up plan for possible cardioversion  Nena Jordan, PharmD, BCPS 06/02/2014, 9:37 AM

## 2014-06-02 NOTE — Progress Notes (Signed)
PT Cancellation Note  Patient Details Name: [name] MRN: [id number] DOB: April 12, 1940   Cancelled Treatment:    Reason Eval/Treat Not Completed: Other (comment) (Pt has been in BR every time PT came over, spoke with RN)   Ramond Dial 06/02/2014, 1:49 PM   Mee Hives, PT MS Acute Rehab Dept. Number: [phone]

## 2014-06-02 NOTE — Consult Note (Signed)
Met with the patient at bedside to offer [facility] services as benefit of insurance. Patient agreeable to services and will receive post hospital discharge call and will be evaluated for monthly home visits. Patient signed consent and copy with folder of [facility] information given.  Updated the inpatient RNCM of [facility] involvement with the patient.   Of note, [facility] services does not replace or interfere with any services that are arranged by inpatient case management or social work. For additional questions or referrals please contact, Natividad Brood, RN BSN CCM, [facility] Liaison at [phone].

## 2014-06-02 NOTE — Progress Notes (Signed)
Pt ambulated 100 ft with rolling walker and RN. Pt on RA. Pt had to sit down once in hallway to rest due to fatigue. Pt returned to room with call bell in reach and bed alarm on. Will continue to monitor.   Domingo Dimes RN

## 2014-06-02 NOTE — Progress Notes (Signed)
CARDIAC REHAB PHASE I   PRE:  Rate/Rhythm: 101 a fib  BP:  Supine: 101/75  Sitting:   Standing:    SaO2: 92%RA  MODE:  Ambulation: 350 ft   POST:  Rate/Rhythm: up to 126 afib  BP:  Supine:   Sitting: 96/80  Standing:    SaO2: 97%RA 0915-0940 Pt walked 350 ft with rollator, gait belt use, and asst x 2. Sat three times to rest due to tiredness and leg weakness. Heart rate to 126. To recliner with call bell after walk. PT to see today too.    Graylon Good, RN BSN  06/02/2014 9:38 AM

## 2014-06-03 MED ORDER — METOPROLOL TARTRATE 12.5 MG HALF TABLET
12.5000 mg | ORAL_TABLET | Freq: Two times a day (BID) | ORAL | Status: DC
  Administered 2014-06-03 – 2014-06-05 (×5): 12.5 mg via ORAL
  Filled 2014-06-03 (×6): qty 1

## 2014-06-03 NOTE — Progress Notes (Signed)
Physical Therapy Treatment Patient Details Name: [name] MRN: [id number] DOB: 08/30/1940 Today's Date: 06/03/2014    History of Present Illness 74 y.o. male s/p REDO CORONARY ARTERY BYPASS GRAFTING     PT Comments    Progressing towards physical therapy goals, ambulating 75 feet today before needing a seated rest break. Continues to complain of lightheadedness (see vitals below.) Needs frequent reinforcement to maintain sternal precautions, as these have been reviewed several times, and again today. Patient will continue to benefit from skilled physical therapy services to further improve independence with functional mobility.   Follow Up Recommendations  SNF     Equipment Recommendations  None recommended by PT    Recommendations for Other Services OT consult     Precautions / Restrictions Precautions Precautions: Fall;Sternal Precaution Comments: Reviewed sternal precautions. Restrictions Weight Bearing Restrictions: Yes Other Position/Activity Restrictions: Sternal    Mobility  Bed Mobility               General bed mobility comments: in chair at start of therapy  Transfers Overall transfer level: Needs assistance Equipment used: 4-wheeled walker Transfers: Sit to/from Stand Sit to Stand: Min assist         General transfer comment: Min assist for boost to stand from reclining chair and rollator. Max cues for sternal precautions.  Ambulation/Gait Ambulation/Gait assistance: Min assist Ambulation Distance (Feet): 75 Feet (x2) Assistive device: 4-wheeled walker Gait Pattern/deviations: Step-through pattern;Decreased stride length;Trunk flexed Gait velocity: decreased   General Gait Details: Needed assist to control rollator for seated rest break at 75 feet due to feeling fatigued. Able to continue with HR up to 90 bpm. Complains of lightheadedness throughout but eager to continue ambulating (see comments below for significant BP recordings).  Cues for pacing and education for resting as needed by being aware of symptoms.   Stairs            Wheelchair Mobility    Modified Rankin (Stroke Patients Only)       Balance                                    Cognition Arousal/Alertness: Awake/alert Behavior During Therapy: WFL for tasks assessed/performed Overall Cognitive Status: Within Functional Limits for tasks assessed       Memory: Decreased recall of precautions              Exercises General Exercises - Lower Extremity Ankle Circles/Pumps: AROM;Both;10 reps;Seated Long Arc Quad: AROM;Both;10 reps;Seated Hip Flexion/Marching: Strengthening;Both;10 reps;Seated    General Comments General comments (skin integrity, edema, etc.): VITALS: sitting at start of therapy 110/73BP -HR 76 - -- - ambulating BP 105/36 HR 86 SpO2 96% on room air.  ---- - - End of bout BP 99/41, HR 90.      Pertinent Vitals/Pain Pain Assessment: Faces Faces Pain Scale: Hurts even more Pain Location: sternum Pain Intervention(s): Monitored during session;Repositioned;Limited activity within patient's tolerance   Sitting BP: 110/73 - HR 76  Resting after ambulating 75 feet BP: 105/36 - HR 80  Resting after ambulating additional 75 feet BP: 99/41 - HR 90    Home Living                      Prior Function            PT Goals (current goals can now be found in the care plan section) Acute Rehab PT  Goals Patient Stated Goal: Go home PT Goal Formulation: With patient Time For Goal Achievement: 06/15/14 Potential to Achieve Goals: Good Progress towards PT goals: Progressing toward goals    Frequency  Min 3X/week    PT Plan Current plan remains appropriate    Co-evaluation             End of Session Equipment Utilized During Treatment: Gait belt Activity Tolerance: Patient tolerated treatment well Patient left: with call bell/phone within reach;in chair     Time: 0160-1093 PT Time  Calculation (min) (ACUTE ONLY): 25 min  Charges:  $Gait Training: 8-22 mins $Therapeutic Activity: 8-22 mins                    G Codes:      Ellouise Newer 06/21/14, 2:10 PM Camille Bal [place], [place]

## 2014-06-03 NOTE — Progress Notes (Signed)
CARDIAC REHAB PHASE I   PRE:  Rate/Rhythm: 78 afib    BP: sitting 107/43, after standing and sitting 86/63, again 90/40    SaO2: 90 RA  MODE:  Ambulation: 150 ft   POST:  Rate/Rhythm: 94 afib    BP: sitting 84/34     SaO2: 93-94 RA   Pt orthostatic today with symptoms of dizziness and weak legs. Sat x2 in room before walking (see BP above). Able to walk with x3 more sitting rest stops due to weak legs. Returning BP with last sitting rest was 84/34. To recliner after walk. Encouraged sitting up, IS, fluids. Pt on chair alarm. Rhythm stable. [id number]  [name] [place] CES, ACSM 06/03/2014 10:23 AM

## 2014-06-03 NOTE — Progress Notes (Signed)
Pt instructed to use call bell due to recent fall.  Bed alarm set.  Pt alert and oriented but still resists using call bell. Pt felt dizzy with ambulation to restroom. Pt resting with call bell within reach.  Will continue to monitor.  Payton Emerald, RN

## 2014-06-03 NOTE — Progress Notes (Signed)
Per Dr. Ron Parker eliquis not given this morning. Dr. Ron Parker to discontinue medication and have surgical team reorder after pacing wires removed.  I called PA Jadene Pierini (in [place]) and assistant will relay message. Payton Emerald, RN

## 2014-06-03 NOTE — Progress Notes (Signed)
SUBJECTIVE:  Patient is looking stronger each day. I am aware that his external pacing wires are still in place and that there is a plan to probably pull them this morning. I've spoken with the nurse to be sure that it is clear that the patient should not receive Eliquis this morning. I have actually discontinued this medication this morning. Nursing will be communicating with cardiac surgery concerning when Eliquis can be started. The patient's blood pressure has been too low to use Cardizem. It was held and then discontinued. I certainly agree. As of today his rate is controlled with his amiodarone loading and a small dose of metoprolol. His rhythm now looks like atrial flutter   Filed Vitals:   06/02/14 2129 06/03/14 0019 06/03/14 0412 06/03/14 0500  BP: 124/60 118/65 118/62   Pulse: 88 88 80   Temp:   97.6 F (36.4 C)   TempSrc:   Oral   Resp:   18   Height:      Weight:    162 lb 0.6 oz (73.5 kg)  SpO2:   91%      Intake/Output Summary (Last 24 hours) at 06/03/14 0919 Last data filed at 06/02/14 1758  Gross per 24 hour  Intake    480 ml  Output      0 ml  Net    480 ml    LABS: Basic Metabolic Panel:  Recent Labs  06/02/14 0426  NA 132*  K 4.1  CL 98  CO2 28  GLUCOSE 115*  BUN 10  CREATININE 0.82  CALCIUM 8.3*   Liver Function Tests: No results for input(s): AST, ALT, ALKPHOS, BILITOT, PROT, ALBUMIN in the last 72 hours. No results for input(s): LIPASE, AMYLASE in the last 72 hours. CBC:  Recent Labs  06/02/14 0426  WBC 5.5  HGB 8.1*  HCT 23.5*  MCV 92.9  PLT 154   Cardiac Enzymes: No results for input(s): CKTOTAL, CKMB, CKMBINDEX, TROPONINI in the last 72 hours. BNP: Invalid input(s): POCBNP D-Dimer: No results for input(s): DDIMER in the last 72 hours. Hemoglobin A1C: No results for input(s): HGBA1C in the last 72 hours. Fasting Lipid Panel: No results for input(s): CHOL, HDL, LDLCALC, TRIG, CHOLHDL, LDLDIRECT in the last 72 hours. Thyroid  Function Tests: No results for input(s): TSH, T4TOTAL, T3FREE, THYROIDAB in the last 72 hours.  Invalid input(s): FREET3  RADIOLOGY: Ct Abdomen Pelvis W Wo Contrast  05/26/2014   CLINICAL DATA:  Status post robotic radical prostatectomy 6 years ago with chronic painless hematuria, preop cardiac bypass on Friday  EXAM: CT ABDOMEN AND PELVIS WITHOUT AND WITH CONTRAST  TECHNIQUE: Multidetector CT imaging of the abdomen and pelvis was performed following the standard protocol before and following the bolus administration of intravenous contrast.  CONTRAST:  152mL OMNIPAQUE IOHEXOL 300 MG/ML  SOLN  COMPARISON:  11/04/2009  FINDINGS: Lower chest: Emphysematous changes with dependent atelectasis at the lung bases.  Hepatobiliary: 3 mm cyst in the posterior segment right hepatic lobe (series 301/ image 12). No suspicious/enhancing hepatic lesions.  Layering gallbladder sludge and/or gallstones (series 31/ image 34). No intrahepatic or extrahepatic ductal dilatation.  Pancreas: Within normal limits.  Spleen: Within normal limits.  Adrenals/Urinary Tract: Adrenal glands are unremarkable.  4 mm cyst in the lateral left upper kidney. Right kidney is within normal limits. No enhancing renal lesions. No hydronephrosis.  No renal, ureteral, or bladder calculi.  Bladder is within normal limits.  Stomach/Bowel: Stomach is moderately distended but within normal  limits.  No evidence of bowel obstruction.  Rectosigmoid colon is mildly thick-walled but underdistended.  Vascular/Lymphatic: Atherosclerotic calcifications of the abdominal aorta and branch vessels.  No suspicious abdominopelvic lymphadenopathy.  Reproductive: Status post prostatectomy.  Other: No abdominopelvic ascites.  Tiny fat containing right inguinal hernia.  Musculoskeletal: Degenerative changes of the visualized thoracolumbar spine.  IMPRESSION: No renal, ureteral, or bladder calculi.  No hydronephrosis.  4 mm cyst in the left upper kidney.  No enhancing renal  lesions.  Status post prostatectomy. No evidence of metastatic disease in the abdomen/pelvis.   Electronically Signed   By: Julian Hy M.D.   On: 05/26/2014 12:32   Dg Chest 2 View  05/31/2014   CLINICAL DATA:  Coronary artery disease post CABG, history COPD, MI, hyperlipidemia, smoker, prostate cancer  EXAM: CHEST  2 VIEW  COMPARISON:  05/30/2014  FINDINGS: Interval removal of RIGHT jugular line.  Upper normal heart size post CABG.  Atherosclerotic calcification aorta.  Mediastinal contours and pulmonary vascularity normal.  Emphysematous changes with bibasilar atelectasis and small pleural effusions.  Remaining lungs clear.  No pneumothorax or acute osseous findings.  IMPRESSION: Post CABG.  COPD changes with bibasilar atelectasis and small pleural effusions.   Electronically Signed   By: Lavonia Dana M.D.   On: 05/31/2014 08:07   Dg Chest Port 1 View  05/30/2014   CLINICAL DATA:  Status post CABG x3  EXAM: PORTABLE CHEST - 1 VIEW  COMPARISON:  05/29/2014  FINDINGS: Swan-Ganz catheter is been removed. The right jugular sheath remains. The right chest tube has been removed. Mediastinal drain is been removed. No pneumothorax is evident. Mild atelectatic appearing basilar opacities persist bilaterally.  IMPRESSION: Removal of mediastinal drain, right chest tube and SG catheter. Persistent mild atelectatic opacities   Electronically Signed   By: Andreas Newport M.D.   On: 05/30/2014 05:44   Dg Chest Port 1 View  05/29/2014   CLINICAL DATA:  CABG x4  EXAM: PORTABLE CHEST - 1 VIEW  COMPARISON:  05/28/2014  FINDINGS: There is a right chest tube appearing unchanged in position. There is a right jugular Swan-Ganz catheter extending well out the right lower lobe artery. Mediastinal drain noted. Endotracheal tube and nasogastric tube have been removed. Left chest tube has been removed. No pneumothorax is evident. Mild atelectatic appearing basilar opacities have partially cleared.  IMPRESSION: Removal of the  endotracheal tube, nasogastric tube and left chest tube. Swan-Ganz catheter extends far out the right lower lobe pulmonary artery. Partial clearance of basilar opacities.   Electronically Signed   By: Andreas Newport M.D.   On: 05/29/2014 05:42   Dg Chest Port 1 View  05/28/2014   CLINICAL DATA:  Status post coronary artery bypass grafting. Hypoxia.  EXAM: PORTABLE CHEST - 1 VIEW  COMPARISON:  May 20, 2014  FINDINGS: Patient is status post coronary artery bypass grafting. Endotracheal tube tip is 6.6 cm above the carina. The Swan-Ganz catheter tip is in the midportion of the right interlobar pulmonary artery. The tip is approximately 5 cm distal to the main right pulmonary artery. Nasogastric tube tip and side port are in the stomach. There is a chest tube on the right. Temporary pacemaker wires are attached to the right heart. No pneumothorax.  There is left lower lobe consolidation with small left effusion. There is subsegmental atelectasis in the right base. Heart size is normal. Pulmonary vascularity is normal. There is atherosclerotic change in the aorta.  IMPRESSION: Tube and catheter positions as described without  pneumothorax. Note that the Swan-Ganz catheter tip is well into the right interlobar pulmonary artery with the tip approximately 5 cm distal to the right main pulmonary artery.  There is left lower lobe consolidation with small left effusion. There is mild right base atelectasis.  Heart size is within normal limits.   Electronically Signed   By: Lowella Grip III M.D.   On: 05/28/2014 16:05   Dg Chest Port 1 View  05/21/2014   CLINICAL DATA:  Left arm burning around 430 this morning resolved in then came back at 2000 hr tonight. Left arm numbness intermittently.  EXAM: PORTABLE CHEST - 1 VIEW  COMPARISON:  05/07/2014  FINDINGS: Postoperative changes in the mediastinum. Normal heart size and pulmonary vascularity. No focal airspace disease or consolidation in the lungs. No blunting  of costophrenic angles. No pneumothorax. Mediastinal contours appear intact.  IMPRESSION: No active disease.   Electronically Signed   By: Lucienne Capers M.D.   On: 05/21/2014 00:36   Dg Chest Port 1 View  05/07/2014   CLINICAL DATA:  Acute respiratory failure  EXAM: PORTABLE CHEST - 1 VIEW  COMPARISON:  05/06/2014  FINDINGS: Cardiac shadow is stable. Postsurgical changes are again seen. An endotracheal tube, nasogastric catheter and right jugular central venous line are again seen and stable in appearance. Minimal persistent changes in the bases are seen stable from the prior exam. No new focal abnormality is seen. No bony abnormality is noted.  IMPRESSION: Tubes and lines as described above.  No significant interval change from the previous day.   Electronically Signed   By: Inez Catalina M.D.   On: 05/07/2014 07:24   Dg Chest Port 1 View  05/06/2014   CLINICAL DATA:  Respiratory acidosis.  EXAM: PORTABLE CHEST - 1 VIEW  COMPARISON:  05/05/2014.  FINDINGS: Endotracheal tube, NG tube, right IJ line stable position. Prior CABG. Heart size stable. Bibasilar pulmonary infiltrates have improved slightly. No prominent pleural effusion. No pneumothorax. No acute bony abnormality.  IMPRESSION: 1. Lines and tubes in stable position. 2. Interim slight improvement of bibasilar pulmonary alveolar infiltrates consistent with partial clearing of pulmonary edema and/or pneumonia. 3. Prior CABG.  Heart size stable.   Electronically Signed   By: Marcello Moores  Register   On: 05/06/2014 07:48   Dg Chest Port 1 View  05/05/2014   CLINICAL DATA:  Acute respiratory failure  EXAM: PORTABLE CHEST - 1 VIEW  COMPARISON:  05/04/2014  FINDINGS: Cardiomediastinal silhouette is stable. Status post median sternotomy. Stable endotracheal and NG tube position. Right IJ central line is unchanged in position. Persistent mild congestion/pulmonary edema. Probable bilateral small pleural effusion with bilateral basilar atelectasis or infiltrate.   IMPRESSION: Stable support apparatus. Persistent mild congestion/pulmonary edema. Probable bilateral small pleural effusion with bilateral basilar atelectasis or infiltrate.   Electronically Signed   By: Lahoma Crocker M.D.   On: 05/05/2014 08:42   Dg Chest Port 1 View  05/04/2014   CLINICAL DATA:  74 year old male with acute respiratory failure on a ventilator.  EXAM: PORTABLE CHEST - 1 VIEW  COMPARISON:  Chest x-ray 05/03/2014.  FINDINGS: An endotracheal tube is in place with tip 5.9 cm above the carina. There is a right-sided internal jugular central venous catheter with tip terminating in the distal superior vena cava. Transcutaneous pacer/defibrillator pads project over the mid and left hemithorax. A nasogastric tube is seen extending into the stomach, however, the tip of the nasogastric tube extends below the lower margin of the image. Lung volumes  appear normal. There is cephalization of the pulmonary vasculature, indistinctness of the interstitial markings, and patchy airspace disease throughout the lungs bilaterally suggestive of moderate pulmonary edema. Small to moderate bilateral pleural effusions. Mild cardiomegaly. The patient is rotated to the right on today's exam, resulting in distortion of the mediastinal contours and reduced diagnostic sensitivity and specificity for mediastinal pathology. Atherosclerosis in the thoracic aorta.  IMPRESSION: 1. Support apparatus, as above. 2. The appearance the chest suggests underlying congestive heart failure, as above. 3. Bibasilar opacities presumably reflect a combination of atelectasis with superimposed small to moderate bilateral pleural effusions, however, underlying airspace consolidation from infection or aspiration is not excluded.   Electronically Signed   By: Vinnie Langton M.D.   On: 05/04/2014 11:34    PHYSICAL EXAM   patient is oriented to person time and place. Affect is normal. Cardiac exam reveals S1 and S2.   TELEMETRY: I reviewed  telemetry today June 03, 2014. There is atrial flutter with a controlled rate.   ASSESSMENT AND PLAN:    STEMI 05/04/14 with shock, CPR, CHB, VT   CAD S/P LM and CFX PCI 05/04/14   S/P CABG      Patient continues to improve after very complex course during the hospitalization. I am now aware that the patient's left atrial appendage was not tied off with his surgery.    PAF- on Amiodarone     Currently he is in atrial flutter. His rate is controlled this morning. Diltiazem was stopped with lower blood pressure. I think the plan is to have his external pacing wires removed today. I made sure that he did not receive Eliquis this morning. The surgical team will decide when his Eliquis can be started. I continue to hope that he will convert spontaneously. In his case, because his course has been so complicated and we have to continue adjusting his medications with low blood pressure and question of a fall risk, I wonder if it would not be optimal to plan to cardiovert him before he leaves the hospital. This may have to be done with a TEE cardioversion.    Hematuria    He had had some hematuria. I'm hopeful this will not return when he is anticoagulated.   Dola Argyle 06/03/2014 9:19 AM

## 2014-06-03 NOTE — Progress Notes (Addendum)
[address]       ,[place] [id number]             [phone]      6 Days Post-Op Procedure(s) (LRB): REDO CORONARY ARTERY BYPASS GRAFTING (CABG) (N/A) TRANSESOPHAGEAL ECHOCARDIOGRAM (TEE) (N/A) Subjective: Feels dizzy/lightheaded at times. + orthostatic  Objective: Vital signs in last 24 hours: Temp:  [97.6 F (36.4 C)-97.8 F (36.6 C)] 97.6 F (36.4 C) (02/25 0412) Pulse Rate:  [80-94] 80 (02/25 0412) Cardiac Rhythm:  [-] Atrial fibrillation (02/24 2339) Resp:  [18] 18 (02/25 0412) BP: (80-124)/(35-65) 118/62 mmHg (02/25 0412) SpO2:  [91 %-94 %] 91 % (02/25 0412) Weight:  [162 lb 0.6 oz (73.5 kg)] 162 lb 0.6 oz (73.5 kg) (02/25 0500)  Hemodynamic parameters for last 24 hours:    Intake/Output from previous day: 02/24 0701 - 02/25 0700 In: 720 [P.O.:720] Out: -  Intake/Output this shift:    General appearance: alert, cooperative and no distress Heart: irregularly irregular rhythm Lungs: mild dim in bases Abdomen: benign Extremities: + mild LE edema Wound: incis healing well  Lab Results:  Recent Labs  06/02/14 0426  WBC 5.5  HGB 8.1*  HCT 23.5*  PLT 154   BMET:  Recent Labs  06/02/14 0426  NA 132*  K 4.1  CL 98  CO2 28  GLUCOSE 115*  BUN 10  CREATININE 0.82  CALCIUM 8.3*    PT/INR: No results for input(s): LABPROT, INR in the last 72 hours. ABG    Component Value Date/Time   PHART 7.354 05/29/2014 0002   HCO3 22.6 05/29/2014 0002   TCO2 22 05/29/2014 1625   ACIDBASEDEF 3.0* 05/29/2014 0002   O2SAT 96.0 05/29/2014 0002   CBG (last 3)  No results for input(s): GLUCAP in the last 72 hours.  Meds Scheduled Meds: . amiodarone  300 mg Oral BID  . antiseptic oral rinse  7 mL Mouth Rinse BID  . apixaban  5 mg Oral BID  . atorvastatin  40 mg Oral q1800  . diltiazem  60 mg Oral 4 times per day  . docusate sodium  200 mg Oral Daily  . feeding supplement (ENSURE COMPLETE)  237 mL Oral BID BM  . ferrous gluconate  324  mg Oral BID WC  . folic acid  1 mg Oral Daily  . lisinopril  2.5 mg Oral Daily  . metoprolol tartrate  25 mg Oral BID  . moving right along book   Does not apply Once  . multivitamin with minerals  1 tablet Oral Daily  . pantoprazole  40 mg Oral QAC breakfast  . sodium chloride  3 mL Intravenous Q12H   Continuous Infusions:  PRN Meds:.sodium chloride, bisacodyl **OR** bisacodyl, magnesium hydroxide, ondansetron **OR** ondansetron (ZOFRAN) IV, oxyCODONE, sodium chloride, traMADol  Xrays No results found.  Assessment/Plan: S/P Procedure(s) (LRB): REDO CORONARY ARTERY BYPASS GRAFTING (CABG) (N/A) TRANSESOPHAGEAL ECHOCARDIOGRAM (TEE) (N/A)  1 rhythm remains an issue- will d/c cardizem d/t hypotension ( it has been held a couple times) and lisinopril. Conts amio/ ac rx as outlined .  2 Fall risk- care with ambulation 3 epw's will need to be d/c'd soon   LOS: 13 days    GOLD,WAYNE E 06/03/2014  Will hold eliquis to remove pacing wires in am, then resume  Decrease bp meds gets lightheaded when up walking I have seen and examined Arnette Schaumann and agree with the above assessment  and plan.  Grace Isaac MD  Beeper [phone] Office [phone] 06/03/2014 8:57 AM

## 2014-06-04 MED ORDER — APIXABAN 5 MG PO TABS
5.0000 mg | ORAL_TABLET | Freq: Two times a day (BID) | ORAL | Status: DC
  Administered 2014-06-04 – 2014-06-05 (×2): 5 mg via ORAL
  Filled 2014-06-04 (×3): qty 1

## 2014-06-04 MED ORDER — FOLIC ACID 1 MG PO TABS: 1.0000 mg | ORAL_TABLET | Freq: Every day | ORAL | Status: DC

## 2014-06-04 MED ORDER — CLOPIDOGREL BISULFATE 75 MG PO TABS
75.0000 mg | ORAL_TABLET | Freq: Every day | ORAL | Status: DC
  Administered 2014-06-04 – 2014-06-05 (×2): 75 mg via ORAL
  Filled 2014-06-04 (×2): qty 1

## 2014-06-04 MED ORDER — FERROUS GLUCONATE 324 (38 FE) MG PO TABS: 324.0000 mg | ORAL_TABLET | Freq: Two times a day (BID) | ORAL | Status: DC

## 2014-06-04 MED ORDER — TRAMADOL HCL 50 MG PO TABS: 50.0000 mg | ORAL_TABLET | ORAL | Status: DC | PRN

## 2014-06-04 MED ORDER — METOPROLOL TARTRATE 25 MG PO TABS: 12.5000 mg | ORAL_TABLET | Freq: Two times a day (BID) | ORAL | Status: DC

## 2014-06-04 MED ORDER — OXYCODONE HCL 5 MG PO TABS: 5.0000 mg | ORAL_TABLET | ORAL | Status: DC | PRN

## 2014-06-04 MED ORDER — ATORVASTATIN CALCIUM 40 MG PO TABS: 40.0000 mg | ORAL_TABLET | Freq: Every day | ORAL | Status: DC

## 2014-06-04 NOTE — Progress Notes (Addendum)
      [address]       [place] [address]             [phone]      7 Days Post-Op Procedure(s) (LRB): REDO CORONARY ARTERY BYPASS GRAFTING (CABG) (N/A) TRANSESOPHAGEAL ECHOCARDIOGRAM (TEE) (N/A) Subjective: Feels ok    Objective: Vital signs in last 24 hours: Temp:  [98 F (36.7 C)-98.3 F (36.8 C)] 98.3 F (36.8 C) (02/26 0359) Pulse Rate:  [77-94] 94 (02/26 0359) Cardiac Rhythm:  [-] Atrial fibrillation;Atrial flutter (02/26 0155) Resp:  [17-19] 17 (02/26 0359) BP: (119-132)/(52-67) 121/64 mmHg (02/26 0359) SpO2:  [90 %-92 %] 90 % (02/26 0359) Weight:  [164 lb 6.4 oz (74.571 kg)] 164 lb 6.4 oz (74.571 kg) (02/26 0359)  Hemodynamic parameters for last 24 hours:    Intake/Output from previous day: 02/25 0701 - 02/26 0700 In: 720 [P.O.:720] Out: -  Intake/Output this shift:    General appearance: alert, cooperative and no distress Heart: irregularly irregular rhythm Lungs: clear to auscultation bilaterally Abdomen: benign Extremities: no edema Wound: incis healing well  Lab Results:  Recent Labs  06/02/14 0426  WBC 5.5  HGB 8.1*  HCT 23.5*  PLT 154   BMET:  Recent Labs  06/02/14 0426  NA 132*  K 4.1  CL 98  CO2 28  GLUCOSE 115*  BUN 10  CREATININE 0.82  CALCIUM 8.3*    PT/INR: No results for input(s): LABPROT, INR in the last 72 hours. ABG    Component Value Date/Time   PHART 7.354 05/29/2014 0002   HCO3 22.6 05/29/2014 0002   TCO2 22 05/29/2014 1625   ACIDBASEDEF 3.0* 05/29/2014 0002   O2SAT 96.0 05/29/2014 0002   CBG (last 3)  No results for input(s): GLUCAP in the last 72 hours.  Meds Scheduled Meds: . amiodarone  300 mg Oral BID  . antiseptic oral rinse  7 mL Mouth Rinse BID  . atorvastatin  40 mg Oral q1800  . docusate sodium  200 mg Oral Daily  . feeding supplement (ENSURE COMPLETE)  237 mL Oral BID BM  . ferrous gluconate  324 mg Oral BID WC  . folic acid  1 mg Oral Daily  . metoprolol tartrate   12.5 mg Oral BID  . moving right along book   Does not apply Once  . multivitamin with minerals  1 tablet Oral Daily  . pantoprazole  40 mg Oral QAC breakfast  . sodium chloride  3 mL Intravenous Q12H   Continuous Infusions:  PRN Meds:.sodium chloride, bisacodyl **OR** bisacodyl, magnesium hydroxide, ondansetron **OR** ondansetron (ZOFRAN) IV, oxyCODONE, sodium chloride, traMADol  Xrays No results found.  Assessment/Plan: S/P Procedure(s) (LRB): REDO CORONARY ARTERY BYPASS GRAFTING (CABG) (N/A) TRANSESOPHAGEAL ECHOCARDIOGRAM (TEE) (N/A)  1 improving, conts with afib issues 2 epw's -d/c today and restart eliquis later today 3 will prob need short term SNF    LOS: 14 days    GOLD,WAYNE E 06/04/2014  Plan poss snf 1-2 weeks Discussed with cardiology, eliquis and plavix no asa. I have seen and examined [name] and agree with the above assessment  and plan.  Grace Isaac MD Beeper [phone] Office [phone] 06/04/2014 9:35 AM

## 2014-06-04 NOTE — Discharge Summary (Addendum)
[address]       ,[place] [address]             [phone]              Discharge Summary  Name: [name] DOB: [birth year] 74 y.o. MRN: [id number]   Admission Date: 05/20/2014 Discharge Date: 06/05/2014    Admitting Diagnosis: Non-ST elevation myocardial infarction   Discharge Diagnosis:  Non-ST elevation myocardial infarction Atrial flutter Hematuria, resolved Postoperative atrial fibrillation Postoperative thrombocytopenia Expected postop blood loss anemia  Past Medical History  Diagnosis Date  . CAD (coronary artery disease)     CABG x 2 with LIMA to Circumflex and vein graft to PDA  07/13/1991  . History of transient ischemic attack (TIA)   . Hyperlipidemia   . Peripheral neuropathy   . PVD (peripheral vascular disease) with claudication   . Carotid bruit     bilateral  . Ventral hernia   . Personal history of prostate cancer   . Diverticulosis   . ALLERGIC RHINITIS   . Osteoarthritis   . COPD (chronic obstructive pulmonary disease)   . Tobacco use disorder, continuous   . STEMI (ST elevation myocardial infarction) 04/29/2014    PTCA [place] and CFX, in setting of VF/VT arrest and CGS      Procedures: REDO CORONARY ARTERY BYPASS GRAFTING  - 05/28/2014  Right internal mammary artery to intermediate coronary  Saphenous vein graft to obtuse marginal   Saphenous vein graft to posterior descending  Saphenous vein graft to distal left anterior descending  Endoscopic vein harvest left leg    HPI:  The patient is a 74 y.o. male with a long history of CAD, s/p CABG in 1993.  In January of this year, the patient was admitted to [facility] with a NSTEMI, atrial fibrillation with RVR. At that time, catheterization was performed and showed a critical left main, LAD, and CX lesions with a left dominant system. He developed cardiogenic shock requiring balloon pump and IV pressor support. He also suffered  recurrent V- Fib, with CPR and multiple defibrillations performed. He required ventilator support for respiratory failure. He underwent PTCA of the distal left main and ostial circumflex. During that hospitalization, a TCTS consult was obtained for consideration of redo CABG, but patient was not felt to be a candidate at that time due to deconditioning. He was discharged to a nursing home on 05/14/2014. He initially did okay, however, he presented to [facility] on the date of admission with recurrent chest pain. He was again found to have a mildly elevated troponin and to be in atrial flutter. He was seen by cardiology, loaded with Plavix and started on a Heparin drip. He was admitted for further evaluation and treatment.      Hospital Course:  The patient was admitted to [facility] on 05/20/2014. He converted to sinus rhythm on Amiodarone.  The patient developed hematuria and his heparin and Plavix were discontinued. Urinalysis was unremarkable. Urology was consulted and it was felt that since he symptomatically improved after removal of his Foley catheter, this could be treated conservatively.  The patient continued to have intermittent chest burning  relieved with NTG. Dr. Servando Snare was reconsulted to evaluate for possible CABG. It was felt that since the patient had progressed from a rehab standpoint, that he could proceed with surgery at this time.  All risks, benefits and alternatives of surgery were explained in detail, and the patient  agreed to proceed.   The patient was taken to the operating room and underwent the above procedure.    The postoperative course was notable for recurrent atrial fibrillation which converted to sinus rhythm with a bolus of IV Amiodarone.  He as restarted on po Amiodarone as well as a low dose beta blocker. He remained stable otherwise and was able to transfer from SICU to stepdown on postop day 2.  The patient sustained a fall on postop day 3 while  getting out of bed.  He did not sustain any injury or neurologic deficits.  His rhythm has continued to be an issue.  He had recurrent atrial fibrillation/flutter and was reloaded with Amiodarone and started on Cardizem.  He was also started on Plavix, however, due to his high fall risk, this was discontinued and Eliquis was resumed.  He developed hypotension and orthostasis and Cardizem was discontinued. His blood pressures have improved, but he continues to have intermittent PAF.  Cardioversion has been considered if he continues to have arrhythmias.  The patient is otherwise progressing well. Incisions are healing well.  He is tolerating a regular diet and is having normal bowel function.  He has been working with cardiac rehab and physical therapy and remains deconditioned.  It is felt that he will require return to SNF post-discharge for further rehab.  Social work is assisting with placement and it is felt that he will be stable for discharge in the next 24-48 hours provided no acute changes occur.      Recent vital signs:  Filed Vitals:   06/04/14 1016  BP: 146/71  Pulse: 97  Temp:   Resp:     Recent laboratory studies:  CBC:  Recent Labs  06/02/14 0426  WBC 5.5  HGB 8.1*  HCT 23.5*  PLT 154   BMET:   Recent Labs  06/02/14 0426  NA 132*  K 4.1  CL 98  CO2 28  GLUCOSE 115*  BUN 10  CREATININE 0.82  CALCIUM 8.3*    PT/INR: No results for input(s): LABPROT, INR in the last 72 hours.   Medication List    STOP taking these medications        carvedilol 3.125 MG tablet  Commonly known as:  COREG     clobetasol cream 0.05 %  Commonly known as:  TEMOVATE     clobetasol ointment 0.05 %  Commonly known as:  TEMOVATE     furosemide 20 MG tablet  Commonly known as:  LASIX     polyethylene glycol packet  Commonly known as:  MIRALAX / GLYCOLAX     PRESCRIPTION MEDICATION     ranolazine 500 MG 12 hr tablet  Commonly known as:  RANEXA     sodium fluoride 1.1  % Gel dental gel  Commonly known as:  FLUORISHIELD     sodium phosphate enema  Commonly known as:  FLEET     traZODone 50 MG tablet  Commonly known as:  DESYREL     triamcinolone ointment 0.5 %  Commonly known as:  KENALOG      TAKE these medications        acetaminophen 325 MG tablet  Commonly known as:  TYLENOL  Take 975 mg by mouth 3 (three) times daily as needed (pain).     amiodarone 100 MG tablet  Commonly known as:  PACERONE  Take 1 tablet (100 mg total) by mouth 2 (two) times daily.     apixaban 5 MG Tabs tablet  Commonly known as:  ELIQUIS  Take 1 tablet (5 mg total) by mouth 2 (two) times daily.     atorvastatin 40 MG tablet  Commonly known as:  LIPITOR  Take 1 tablet (40 mg total) by mouth at bedtime.     bisacodyl 10 MG suppository  Commonly known as:  DULCOLAX  Place 1 suppository (10 mg total) rectally daily as needed for moderate constipation.     clopidogrel 75 MG tablet  Commonly known as:  PLAVIX  Take 1 tablet (75 mg total) by mouth daily.     feeding supplement (ENSURE COMPLETE) Liqd  Take 237 mLs by mouth 2 (two) times daily between meals.     ferrous gluconate 324 MG tablet  Commonly known as:  FERGON  Take 1 tablet (324 mg total) by mouth 2 (two) times daily with a meal.     fluticasone 50 MCG/ACT nasal spray  Commonly known as:  FLONASE  Place 1 spray into both nostrils 2 (two) times daily.     folic acid 1 MG tablet  Commonly known as:  FOLVITE  Take 1 tablet (1 mg total) by mouth daily.     loratadine 10 MG tablet  Commonly known as:  CLARITIN  Take 10 mg by mouth daily as needed for allergies.     magnesium hydroxide 400 MG/5ML suspension  Commonly known as:  MILK OF MAGNESIA  Take 30 mLs by mouth daily as needed for mild constipation.     metoprolol tartrate 25 MG tablet  Commonly known as:  LOPRESSOR  Take 0.5 tablets (12.5 mg total) by mouth 2 (two) times daily.     nitroGLYCERIN 0.4 MG SL tablet  Commonly known as:   NITROSTAT  Place 0.4 mg under the tongue every 5 (five) minutes as needed for chest pain.     omeprazole 20 MG capsule  Commonly known as:  PRILOSEC  Take 20 mg by mouth daily.     oxyCODONE 5 MG immediate release tablet  Commonly known as:  Oxy IR/ROXICODONE  Take 1-2 tablets (5-10 mg total) by mouth every 3 (three) hours as needed for severe pain.     phenol 1.4 % Liqd  Commonly known as:  CHLORASEPTIC  Use as directed 1 spray in the mouth or throat 3 (three) times daily as needed for throat irritation / pain.     traMADol 50 MG tablet  Commonly known as:  ULTRAM  Take 1-2 tablets (50-100 mg total) by mouth every 4 (four) hours as needed for moderate pain.        Discharge Instructions:   1. Please obtain vital signs at least one time daily 2. Please weigh the patient daily. If he or she continues to gain weight or develops lower extremity edema, contact the office at [phone]. 3. Ambulate patient at least three times daily and please use sternal precautions. 4. The patient is to refrain from driving, heavy lifting or strenuous activity.   5. May shower daily and clean incisions with soap and water.   6. May resume regular heart healthy diet.    Follow Up: Follow-up Information    Follow up with Grace Isaac, MD On 07/08/2014.   Specialty:  Cardiothoracic Surgery   Why:  Have a chest x-ray at [place] [facility] at 8:30, then see MD at 9:30   Contact information:   [place] [address] [phone]       Follow up with Richardson Dopp, PA-C  On 06/17/2014.   Specialty:  Physician Assistant   Why:  Appointment is with Richardson Dopp, PA-C 3/10 @11 :10am ([place])    Contact information:   [address] [place] [address] [phone]        Burke Keels 06/04/2014, 11:32 AM    The patient has been discharged on:  1.Beta Blocker: Yes [ x ]  No [ ]   If No, reason:    2.Ace Inhibitor/ARB: Yes [ ]   No  [ x ]  If No, reason:  Hypotension   3.Statin: Yes [ x ]  No [ ]   If No, reason:    4.Ecasa: Yes [  ]  No [ x ]  If No, reason: On Eliquis and Plavix

## 2014-06-04 NOTE — Clinical Social Work Placement (Addendum)
Clinical Social Work Department CLINICAL SOCIAL WORK PLACEMENT NOTE 06/04/2014  Patient:  [name], [name]  Account Number:  [id number] Admit date:  05/20/2014  Clinical Social Worker:  Domenica Reamer, CLINICAL SOCIAL WORKER  Date/time:  06/04/2014 10:00 AM  Clinical Social Work is seeking post-discharge placement for this patient at the following level of care:   [place]   (*CSW will update this form in Epic as items are completed)   06/04/2014  Patient/family provided with [place] Department of Clinical Social Work's list of facilities offering this level of care within the geographic area requested by the patient (or if unable, by the patient's family).  06/04/2014  Patient/family informed of their freedom to choose among providers that offer the needed level of care, that participate in Medicare, Medicaid or managed care program needed by the patient, have an available bed and are willing to accept the patient.  06/04/2014  Patient/family informed of [facility]' ownership interest in [facility] as well as of the fact that they are under no obligation to receive care at this facility.  PASARR submitted to EDS on 06/04/2014 PASARR number received on 06/04/2014  FL2 transmitted to all facilities in geographic area requested by pt/family on  06/04/2014 FL2 transmitted to all facilities within larger geographic area on 06/04/2014  Patient informed that his/her managed care company has contracts with or will negotiate with  certain facilities, including the following:     Patient/family informed of bed offers received:  06/04/2014 Patient chooses bed at [facility] Physician recommends and patient chooses bed at    Patient to be transferred to [facility] on  06/05/2014 Patient to be transferred to facility by ambulance [facility]) Patient and family notified of transfer on 06/05/2014 Name of family member notified:   Pt, pt wife, [name], and pt son, [name] notified at bedside.  The following physician request were entered in Epic:   Additional Comments: Domenica Reamer, [place] Worker [phone]  Alison Murray, MSW, [place] Work Weekend coverage [phone]

## 2014-06-04 NOTE — Progress Notes (Signed)
SUBJECTIVE:  The patient continues to get stronger. He will have his pacing wires pulled today. His heart rate at rest is controlled. However he has significant increased heart rate with ambulation. Diltiazem could not be used because of decreased blood pressure.   Filed Vitals:   06/03/14 1347 06/03/14 1401 06/03/14 2028 06/04/14 0359  BP: 120/67 119/66 132/52 121/64  Pulse: 82 77 88 94  Temp: 98 F (36.7 C)  98.1 F (36.7 C) 98.3 F (36.8 C)  TempSrc: Oral  Oral Oral  Resp: 18  19 17   Height:      Weight:    164 lb 6.4 oz (74.571 kg)  SpO2: 92%  91% 90%     Intake/Output Summary (Last 24 hours) at 06/04/14 0831 Last data filed at 06/03/14 1834  Gross per 24 hour  Intake    720 ml  Output      0 ml  Net    720 ml    LABS: Basic Metabolic Panel:  Recent Labs  06/02/14 0426  NA 132*  K 4.1  CL 98  CO2 28  GLUCOSE 115*  BUN 10  CREATININE 0.82  CALCIUM 8.3*   Liver Function Tests: No results for input(s): AST, ALT, ALKPHOS, BILITOT, PROT, ALBUMIN in the last 72 hours. No results for input(s): LIPASE, AMYLASE in the last 72 hours. CBC:  Recent Labs  06/02/14 0426  WBC 5.5  HGB 8.1*  HCT 23.5*  MCV 92.9  PLT 154   Cardiac Enzymes: No results for input(s): CKTOTAL, CKMB, CKMBINDEX, TROPONINI in the last 72 hours. BNP: Invalid input(s): POCBNP D-Dimer: No results for input(s): DDIMER in the last 72 hours. Hemoglobin A1C: No results for input(s): HGBA1C in the last 72 hours. Fasting Lipid Panel: No results for input(s): CHOL, HDL, LDLCALC, TRIG, CHOLHDL, LDLDIRECT in the last 72 hours. Thyroid Function Tests: No results for input(s): TSH, T4TOTAL, T3FREE, THYROIDAB in the last 72 hours.  Invalid input(s): FREET3  RADIOLOGY: Ct Abdomen Pelvis W Wo Contrast  05/26/2014   CLINICAL DATA:  Status post robotic radical prostatectomy 6 years ago with chronic painless hematuria, preop cardiac bypass on Friday  EXAM: CT ABDOMEN AND PELVIS WITHOUT AND WITH  CONTRAST  TECHNIQUE: Multidetector CT imaging of the abdomen and pelvis was performed following the standard protocol before and following the bolus administration of intravenous contrast.  CONTRAST:  147mL OMNIPAQUE IOHEXOL 300 MG/ML  SOLN  COMPARISON:  11/04/2009  FINDINGS: Lower chest: Emphysematous changes with dependent atelectasis at the lung bases.  Hepatobiliary: 3 mm cyst in the posterior segment right hepatic lobe (series 301/ image 12). No suspicious/enhancing hepatic lesions.  Layering gallbladder sludge and/or gallstones (series 31/ image 34). No intrahepatic or extrahepatic ductal dilatation.  Pancreas: Within normal limits.  Spleen: Within normal limits.  Adrenals/Urinary Tract: Adrenal glands are unremarkable.  4 mm cyst in the lateral left upper kidney. Right kidney is within normal limits. No enhancing renal lesions. No hydronephrosis.  No renal, ureteral, or bladder calculi.  Bladder is within normal limits.  Stomach/Bowel: Stomach is moderately distended but within normal limits.  No evidence of bowel obstruction.  Rectosigmoid colon is mildly thick-walled but underdistended.  Vascular/Lymphatic: Atherosclerotic calcifications of the abdominal aorta and branch vessels.  No suspicious abdominopelvic lymphadenopathy.  Reproductive: Status post prostatectomy.  Other: No abdominopelvic ascites.  Tiny fat containing right inguinal hernia.  Musculoskeletal: Degenerative changes of the visualized thoracolumbar spine.  IMPRESSION: No renal, ureteral, or bladder calculi.  No hydronephrosis.  4  mm cyst in the left upper kidney.  No enhancing renal lesions.  Status post prostatectomy. No evidence of metastatic disease in the abdomen/pelvis.   Electronically Signed   By: Julian Hy M.D.   On: 05/26/2014 12:32   Dg Chest 2 View  05/31/2014   CLINICAL DATA:  Coronary artery disease post CABG, history COPD, MI, hyperlipidemia, smoker, prostate cancer  EXAM: CHEST  2 VIEW  COMPARISON:  05/30/2014   FINDINGS: Interval removal of RIGHT jugular line.  Upper normal heart size post CABG.  Atherosclerotic calcification aorta.  Mediastinal contours and pulmonary vascularity normal.  Emphysematous changes with bibasilar atelectasis and small pleural effusions.  Remaining lungs clear.  No pneumothorax or acute osseous findings.  IMPRESSION: Post CABG.  COPD changes with bibasilar atelectasis and small pleural effusions.   Electronically Signed   By: Lavonia Dana M.D.   On: 05/31/2014 08:07   Dg Chest Port 1 View  05/30/2014   CLINICAL DATA:  Status post CABG x3  EXAM: PORTABLE CHEST - 1 VIEW  COMPARISON:  05/29/2014  FINDINGS: Swan-Ganz catheter is been removed. The right jugular sheath remains. The right chest tube has been removed. Mediastinal drain is been removed. No pneumothorax is evident. Mild atelectatic appearing basilar opacities persist bilaterally.  IMPRESSION: Removal of mediastinal drain, right chest tube and SG catheter. Persistent mild atelectatic opacities   Electronically Signed   By: Andreas Newport M.D.   On: 05/30/2014 05:44   Dg Chest Port 1 View  05/29/2014   CLINICAL DATA:  CABG x4  EXAM: PORTABLE CHEST - 1 VIEW  COMPARISON:  05/28/2014  FINDINGS: There is a right chest tube appearing unchanged in position. There is a right jugular Swan-Ganz catheter extending well out the right lower lobe artery. Mediastinal drain noted. Endotracheal tube and nasogastric tube have been removed. Left chest tube has been removed. No pneumothorax is evident. Mild atelectatic appearing basilar opacities have partially cleared.  IMPRESSION: Removal of the endotracheal tube, nasogastric tube and left chest tube. Swan-Ganz catheter extends far out the right lower lobe pulmonary artery. Partial clearance of basilar opacities.   Electronically Signed   By: Andreas Newport M.D.   On: 05/29/2014 05:42   Dg Chest Port 1 View  05/28/2014   CLINICAL DATA:  Status post coronary artery bypass grafting. Hypoxia.   EXAM: PORTABLE CHEST - 1 VIEW  COMPARISON:  May 20, 2014  FINDINGS: Patient is status post coronary artery bypass grafting. Endotracheal tube tip is 6.6 cm above the carina. The Swan-Ganz catheter tip is in the midportion of the right interlobar pulmonary artery. The tip is approximately 5 cm distal to the main right pulmonary artery. Nasogastric tube tip and side port are in the stomach. There is a chest tube on the right. Temporary pacemaker wires are attached to the right heart. No pneumothorax.  There is left lower lobe consolidation with small left effusion. There is subsegmental atelectasis in the right base. Heart size is normal. Pulmonary vascularity is normal. There is atherosclerotic change in the aorta.  IMPRESSION: Tube and catheter positions as described without pneumothorax. Note that the Swan-Ganz catheter tip is well into the right interlobar pulmonary artery with the tip approximately 5 cm distal to the right main pulmonary artery.  There is left lower lobe consolidation with small left effusion. There is mild right base atelectasis.  Heart size is within normal limits.   Electronically Signed   By: Lowella Grip III M.D.   On: 05/28/2014 16:05  Dg Chest Port 1 View  05/21/2014   CLINICAL DATA:  Left arm burning around 430 this morning resolved in then came back at 2000 hr tonight. Left arm numbness intermittently.  EXAM: PORTABLE CHEST - 1 VIEW  COMPARISON:  05/07/2014  FINDINGS: Postoperative changes in the mediastinum. Normal heart size and pulmonary vascularity. No focal airspace disease or consolidation in the lungs. No blunting of costophrenic angles. No pneumothorax. Mediastinal contours appear intact.  IMPRESSION: No active disease.   Electronically Signed   By: Lucienne Capers M.D.   On: 05/21/2014 00:36   Dg Chest Port 1 View  05/07/2014   CLINICAL DATA:  Acute respiratory failure  EXAM: PORTABLE CHEST - 1 VIEW  COMPARISON:  05/06/2014  FINDINGS: Cardiac shadow is stable.  Postsurgical changes are again seen. An endotracheal tube, nasogastric catheter and right jugular central venous line are again seen and stable in appearance. Minimal persistent changes in the bases are seen stable from the prior exam. No new focal abnormality is seen. No bony abnormality is noted.  IMPRESSION: Tubes and lines as described above.  No significant interval change from the previous day.   Electronically Signed   By: Inez Catalina M.D.   On: 05/07/2014 07:24   Dg Chest Port 1 View  05/06/2014   CLINICAL DATA:  Respiratory acidosis.  EXAM: PORTABLE CHEST - 1 VIEW  COMPARISON:  05/05/2014.  FINDINGS: Endotracheal tube, NG tube, right IJ line stable position. Prior CABG. Heart size stable. Bibasilar pulmonary infiltrates have improved slightly. No prominent pleural effusion. No pneumothorax. No acute bony abnormality.  IMPRESSION: 1. Lines and tubes in stable position. 2. Interim slight improvement of bibasilar pulmonary alveolar infiltrates consistent with partial clearing of pulmonary edema and/or pneumonia. 3. Prior CABG.  Heart size stable.   Electronically Signed   By: Marcello Moores  Register   On: 05/06/2014 07:48   Dg Chest Port 1 View  05/05/2014   CLINICAL DATA:  Acute respiratory failure  EXAM: PORTABLE CHEST - 1 VIEW  COMPARISON:  05/04/2014  FINDINGS: Cardiomediastinal silhouette is stable. Status post median sternotomy. Stable endotracheal and NG tube position. Right IJ central line is unchanged in position. Persistent mild congestion/pulmonary edema. Probable bilateral small pleural effusion with bilateral basilar atelectasis or infiltrate.  IMPRESSION: Stable support apparatus. Persistent mild congestion/pulmonary edema. Probable bilateral small pleural effusion with bilateral basilar atelectasis or infiltrate.   Electronically Signed   By: Lahoma Crocker M.D.   On: 05/05/2014 08:42    PHYSICAL EXAM patient is oriented to person time and place. Affect is normal. Cardiac exam reveals S1 and  S2.   TELEMETRY: I have personally reviewed telemetry today June 04, 2014. It is difficult to tell for sure from telemetry if this is atrial fib or atrial flutter. The rate is controlled at rest. There are episodes of increased rate when he ambulates.   ASSESSMENT AND PLAN:    STEMI 05/04/14 with shock, CPR, CHB, VT   CAD S/P LM and CFX PCI 05/04/14   S/P CABG     Patient continues to improve post CABG. It is noted that the patient did have PCI to the left main and the circumflex as a temporizing measure on May 04, 2014. PTCA was done to both vessels. There was no stent placed. Therefore the need for Plavix at this time is based on recent unstable coronary disease as opposed to a recent stent.    PAF- on Amiodarone    Patient continues to receive significant dose of  amiodarone as part of his load. Pacing wires are to be pulled this morning. Eliquis can be started later today. The plan will be to use Eliquis plus Plavix to treat recent unstable coronary disease and atrial fibrillation. He will not receive aspirin for risk of triple drugs in this setting. There is concern with his fall risk. However I still think it is likely that he will need to be cardioverted before going home. The decision about this will have to be made on a daily basis. His left atrial appendage was not tied off at the time of his surgery. If cardioversion is done, consideration will have to be given to doing a TEE cardioversion. Having Eliquis onboard is very important.    Chronic anticoagulation-on Eliquis at discharge 05/11/14      This will be restarted later today after his pacing wires are out    Hematuria    He had hematuria earlier in the admission. I'm hopeful that he will not have a recurrent problem once he is on an anticoagulant and an antiplatelet drug.   Dola Argyle 06/04/2014 8:31 AM

## 2014-06-04 NOTE — Clinical Social Work Note (Signed)
Patient is now agreeable to SNF placement for short term rehab.  CSW faxed clinicals to [facility] where patient has received rehab before- awaiting bed offer.  Domenica Reamer, [place] Social Worker [phone]

## 2014-06-04 NOTE — Progress Notes (Signed)
Physical Therapy Treatment Patient Details Name: [name] MRN: [id number] DOB: [birth year] Today's Date: 06/04/2014    History of Present Illness 74 y.o. male s/p REDO CORONARY ARTERY BYPASS GRAFTING     PT Comments    Patient limited by dizziness today. Continues to be motivated but needed to sit after ambulating 35 feet. HR elevated to 124 bpm (91 bpm at rest.) He is improving his independence with transfers and bed mobility however continues to require cues to maintain sternal precautions. Patient will continue to benefit from skilled physical therapy services to further improve independence with functional mobility.   Follow Up Recommendations  SNF     Equipment Recommendations  None recommended by PT    Recommendations for Other Services OT consult     Precautions / Restrictions Precautions Precautions: Fall;Sternal Precaution Comments: Reviewed sternal precautions. Restrictions Weight Bearing Restrictions: Yes Other Position/Activity Restrictions: Sternal    Mobility  Bed Mobility Overal bed mobility: Needs Assistance Bed Mobility: Rolling;Sidelying to Sit Rolling: Supervision Sidelying to sit: Min assist       General bed mobility comments: Min assist for truncal support into seated position during log roll training. Cues throughout to maintain sternal precautions  Transfers Overall transfer level: Needs assistance Equipment used: 4-wheeled walker Transfers: Sit to/from Stand Sit to Stand: Min guard         General transfer comment: Min guard for safety. Cues for hand placement, performed from lowest bed setting x2 and rollator seat.  Ambulation/Gait Ambulation/Gait assistance: Min guard Ambulation Distance (Feet): 35 Feet (x2) Assistive device: 4-wheeled walker Gait Pattern/deviations: Step-through pattern;Decreased stride length;Trunk flexed Gait velocity: decreased   General Gait Details: Limited by dizziness. Needed to sit at 35 feet  for several minutes due to complaints of dizziness before returning to room. HR elevated to 124. Cues for safety with sternal precautions and to monitor symptoms of syncope.   Stairs            Wheelchair Mobility    Modified Rankin (Stroke Patients Only)       Balance                                    Cognition Arousal/Alertness: Awake/alert Behavior During Therapy: WFL for tasks assessed/performed Overall Cognitive Status: Within Functional Limits for tasks assessed       Memory: Decreased recall of precautions;Decreased short-term memory              Exercises      General Comments General comments (skin integrity, edema, etc.): See vitals section      Pertinent Vitals/Pain Pain Assessment: Faces Faces Pain Scale: Hurts even more Pain Location: sternum Pain Intervention(s): Monitored during session;Repositioned  Supine: BP 129/82 HR 91  Standing: BP 111/53 HR 106    Home Living                      Prior Function            PT Goals (current goals can now be found in the care plan section) Acute Rehab PT Goals Patient Stated Goal: Go home PT Goal Formulation: With patient Time For Goal Achievement: 06/15/14 Potential to Achieve Goals: Good Progress towards PT goals: Progressing toward goals    Frequency  Min 3X/week    PT Plan Current plan remains appropriate    Co-evaluation  End of Session Equipment Utilized During Treatment: Gait belt Activity Tolerance: Other (comment) (limited by dizziness) Patient left: with call bell/phone within reach;in chair     Time: 5929-2446 PT Time Calculation (min) (ACUTE ONLY): 18 min  Charges:  $Gait Training: 8-22 mins                    G Codes:      Ellouise Newer 2014-06-05, 1:39 PM Camille Bal [place], [place]

## 2014-06-04 NOTE — Progress Notes (Addendum)
CARDIAC REHAB PHASE I   PRE:  Rate/Rhythm: 96 afib    BP: lying 128/60, sitting 112/52, standing 110/52    SaO2: 93 RA  MODE:  Ambulation: 230 ft   POST:  Rate/Rhythm: 111 afib    BP: sitting 110/60     SaO2: 96 RA  Pt stronger this pm with less orthostasis/dizziness. Able to increase distance from yesterday and only sat x2 for weak legs. To recliner after walk, HR stable. Ed completed. Pt interested in CRPII and will send referral to [facility]. Pt sts he will not smoke however it would be best to discuss this more when wife not in room. Will f/u. [id number]   [name] [place] CES, ACSM 06/04/2014 3:25 PM

## 2014-06-04 NOTE — Progress Notes (Signed)
Medicare Important Message given? YES  (If response is "NO", the following Medicare IM given date fields will be blank)  Date Medicare IM given: 06/04/14 Medicare IM given by:  Kasin Tonkinson  

## 2014-06-05 DIAGNOSIS — R2681 Unsteadiness on feet: Secondary | ICD-10-CM | POA: Diagnosis not present

## 2014-06-05 DIAGNOSIS — M6281 Muscle weakness (generalized): Secondary | ICD-10-CM | POA: Diagnosis not present

## 2014-06-05 DIAGNOSIS — M150 Primary generalized (osteo)arthritis: Secondary | ICD-10-CM | POA: Diagnosis not present

## 2014-06-05 DIAGNOSIS — I110 Hypertensive heart disease with heart failure: Secondary | ICD-10-CM | POA: Diagnosis not present

## 2014-06-05 DIAGNOSIS — R319 Hematuria, unspecified: Secondary | ICD-10-CM | POA: Diagnosis not present

## 2014-06-05 DIAGNOSIS — I2111 ST elevation (STEMI) myocardial infarction involving right coronary artery: Secondary | ICD-10-CM | POA: Diagnosis not present

## 2014-06-05 DIAGNOSIS — I2510 Atherosclerotic heart disease of native coronary artery without angina pectoris: Secondary | ICD-10-CM | POA: Diagnosis not present

## 2014-06-05 DIAGNOSIS — I4892 Unspecified atrial flutter: Secondary | ICD-10-CM | POA: Diagnosis not present

## 2014-06-05 DIAGNOSIS — D62 Acute posthemorrhagic anemia: Secondary | ICD-10-CM | POA: Diagnosis not present

## 2014-06-05 DIAGNOSIS — I255 Ischemic cardiomyopathy: Secondary | ICD-10-CM | POA: Diagnosis not present

## 2014-06-05 DIAGNOSIS — E43 Unspecified severe protein-calorie malnutrition: Secondary | ICD-10-CM | POA: Diagnosis not present

## 2014-06-05 DIAGNOSIS — Z9181 History of falling: Secondary | ICD-10-CM | POA: Diagnosis not present

## 2014-06-05 DIAGNOSIS — I4891 Unspecified atrial fibrillation: Secondary | ICD-10-CM | POA: Diagnosis not present

## 2014-06-05 DIAGNOSIS — I480 Paroxysmal atrial fibrillation: Secondary | ICD-10-CM | POA: Diagnosis not present

## 2014-06-05 DIAGNOSIS — I119 Hypertensive heart disease without heart failure: Secondary | ICD-10-CM | POA: Diagnosis not present

## 2014-06-05 DIAGNOSIS — Z951 Presence of aortocoronary bypass graft: Secondary | ICD-10-CM | POA: Diagnosis not present

## 2014-06-05 DIAGNOSIS — Z9861 Coronary angioplasty status: Secondary | ICD-10-CM | POA: Diagnosis not present

## 2014-06-05 DIAGNOSIS — W19XXXA Unspecified fall, initial encounter: Secondary | ICD-10-CM | POA: Diagnosis not present

## 2014-06-05 DIAGNOSIS — F5102 Adjustment insomnia: Secondary | ICD-10-CM | POA: Diagnosis not present

## 2014-06-05 DIAGNOSIS — D682 Hereditary deficiency of other clotting factors: Secondary | ICD-10-CM | POA: Diagnosis not present

## 2014-06-05 DIAGNOSIS — E785 Hyperlipidemia, unspecified: Secondary | ICD-10-CM | POA: Diagnosis not present

## 2014-06-05 DIAGNOSIS — M25511 Pain in right shoulder: Secondary | ICD-10-CM | POA: Diagnosis not present

## 2014-06-05 DIAGNOSIS — I472 Ventricular tachycardia: Secondary | ICD-10-CM | POA: Diagnosis not present

## 2014-06-05 DIAGNOSIS — K219 Gastro-esophageal reflux disease without esophagitis: Secondary | ICD-10-CM | POA: Diagnosis not present

## 2014-06-05 DIAGNOSIS — I214 Non-ST elevation (NSTEMI) myocardial infarction: Secondary | ICD-10-CM | POA: Diagnosis not present

## 2014-06-05 DIAGNOSIS — I509 Heart failure, unspecified: Secondary | ICD-10-CM | POA: Diagnosis not present

## 2014-06-05 DIAGNOSIS — S0990XA Unspecified injury of head, initial encounter: Secondary | ICD-10-CM | POA: Diagnosis not present

## 2014-06-05 DIAGNOSIS — I240 Acute coronary thrombosis not resulting in myocardial infarction: Secondary | ICD-10-CM | POA: Diagnosis not present

## 2014-06-05 DIAGNOSIS — S0093XA Contusion of unspecified part of head, initial encounter: Secondary | ICD-10-CM | POA: Diagnosis not present

## 2014-06-05 DIAGNOSIS — I484 Atypical atrial flutter: Secondary | ICD-10-CM | POA: Diagnosis not present

## 2014-06-05 DIAGNOSIS — I5022 Chronic systolic (congestive) heart failure: Secondary | ICD-10-CM | POA: Diagnosis not present

## 2014-06-05 DIAGNOSIS — R278 Other lack of coordination: Secondary | ICD-10-CM | POA: Diagnosis not present

## 2014-06-05 DIAGNOSIS — G629 Polyneuropathy, unspecified: Secondary | ICD-10-CM | POA: Diagnosis not present

## 2014-06-05 DIAGNOSIS — I442 Atrioventricular block, complete: Secondary | ICD-10-CM | POA: Diagnosis not present

## 2014-06-05 MED ORDER — AMIODARONE HCL 100 MG PO TABS
100.0000 mg | ORAL_TABLET | Freq: Two times a day (BID) | ORAL | Status: DC
  Administered 2014-06-05: 100 mg via ORAL
  Filled 2014-06-05 (×2): qty 1

## 2014-06-05 MED ORDER — AMIODARONE HCL 100 MG PO TABS
100.0000 mg | ORAL_TABLET | Freq: Two times a day (BID) | ORAL | Status: DC
Start: 2014-06-05 — End: 2014-06-08

## 2014-06-05 NOTE — Progress Notes (Signed)
   Subjective: No CP  NO SOB   Objective: Filed Vitals:   06/04/14 1016 06/04/14 1319 06/04/14 1951 06/05/14 0451  BP: 146/71 118/64 135/75 123/54  Pulse: 97 78 97 90  Temp:  97.1 F (36.2 C) 97.6 F (36.4 C) 97.2 F (36.2 C)  TempSrc:  Oral Oral Oral  Resp:  16 18 19   Height:      Weight:    161 lb 9.6 oz (73.301 kg)  SpO2:  96% 95% 93%   Weight change: -2 lb 12.8 oz (-1.27 kg)  Intake/Output Summary (Last 24 hours) at 06/05/14 1154 Last data filed at 06/05/14 0800  Gross per 24 hour  Intake    600 ml  Output      0 ml  Net    600 ml    General: Alert, awake, oriented x3, in no acute distress Neck:  JVP is normal Heart: Regular rate and rhythm, without murmurs, rubs, gallops.  Lungs: Clear to auscultation.  No rales or wheezes. Exemities:  No edema.   Neuro: Grossly intact, nonfocal.  Tel:  Afib/flutter  90s to 110s   Lab Results: No results found for this or any previous visit (from the past 24 hour(s)).  Studies/Results: No results found.  Medications:  Reviewed  }   @PROBHOSP @  1  Atrial fibrillation  I have reviewed tele  I do not think reported QT is accurate  Was in 2:1 at that time  Would increase amio to 200 bid  Continue anticoagulation.    2.  CAD  S/p CABG    3   HL  Continue statin     Has f/u appt with Kathleen Argue on 3/10 then C McAlhany    LOS: 15 days   Dorris Carnes 06/05/2014, 11:54 AM

## 2014-06-05 NOTE — Progress Notes (Addendum)
      [address]       [place] [address]             [phone]      8 Days Post-Op Procedure(s) (LRB): REDO CORONARY ARTERY BYPASS GRAFTING (CABG) (N/A) TRANSESOPHAGEAL ECHOCARDIOGRAM (TEE) (N/A) Subjective: Feels about the same Some dizziness at times   Objective: Vital signs in last 24 hours: Temp:  [97.1 F (36.2 C)-97.6 F (36.4 C)] 97.2 F (36.2 C) (02/27 0451) Pulse Rate:  [78-97] 90 (02/27 0451) Cardiac Rhythm:  [-] Atrial fibrillation (02/26 2115) Resp:  [16-19] 19 (02/27 0451) BP: (118-146)/(54-116) 123/54 mmHg (02/27 0451) SpO2:  [93 %-96 %] 93 % (02/27 0451) Weight:  [161 lb 9.6 oz (73.301 kg)] 161 lb 9.6 oz (73.301 kg) (02/27 0451)  Hemodynamic parameters for last 24 hours:    Intake/Output from previous day: 02/26 0701 - 02/27 0700 In: 360 [P.O.:360] Out: -  Intake/Output this shift:    General appearance: alert, cooperative and no distress Heart: regular rate and rhythm Lungs: clear to auscultation bilaterally Abdomen: benign Extremities: no edema Wound: incis healing well  Lab Results: No results for input(s): WBC, HGB, HCT, PLT in the last 72 hours. BMET: No results for input(s): NA, K, CL, CO2, GLUCOSE, BUN, CREATININE, CALCIUM in the last 72 hours.  PT/INR: No results for input(s): LABPROT, INR in the last 72 hours. ABG    Component Value Date/Time   PHART 7.354 05/29/2014 0002   HCO3 22.6 05/29/2014 0002   TCO2 22 05/29/2014 1625   ACIDBASEDEF 3.0* 05/29/2014 0002   O2SAT 96.0 05/29/2014 0002   CBG (last 3)  No results for input(s): GLUCAP in the last 72 hours.  Meds Scheduled Meds: . amiodarone  300 mg Oral BID  . antiseptic oral rinse  7 mL Mouth Rinse BID  . apixaban  5 mg Oral BID  . atorvastatin  40 mg Oral q1800  . clopidogrel  75 mg Oral Daily  . docusate sodium  200 mg Oral Daily  . feeding supplement (ENSURE COMPLETE)  237 mL Oral BID BM  . ferrous gluconate  324 mg Oral BID WC  . folic acid  1  mg Oral Daily  . metoprolol tartrate  12.5 mg Oral BID  . moving right along book   Does not apply Once  . multivitamin with minerals  1 tablet Oral Daily  . pantoprazole  40 mg Oral QAC breakfast  . sodium chloride  3 mL Intravenous Q12H   Continuous Infusions:  PRN Meds:.sodium chloride, bisacodyl **OR** bisacodyl, magnesium hydroxide, ondansetron **OR** ondansetron (ZOFRAN) IV, oxyCODONE, sodium chloride, traMADol  Xrays No results found.  Assessment/Plan: S/P Procedure(s) (LRB): REDO CORONARY ARTERY BYPASS GRAFTING (CABG) (N/A) TRANSESOPHAGEAL ECHOCARDIOGRAM (TEE) (N/A)  1 conts with afib issues, QTc now greater tham 500, will decrease amio dose to 100 BID 2 cont current ac meds 3 SNF soon   LOS: 15 days    GOLD,WAYNE E 06/05/2014  I have seen and examined the patient and agree with the assessment and plan as outlined.  OWEN,CLARENCE H 06/05/2014 10:50 AM

## 2014-06-05 NOTE — Progress Notes (Signed)
Pt for discharge to [facility].  CSW facilitated pt discharge needs including contacting facility, faxing pt discharge information to [facility], discussing with pt, pt wife, and pt son at bedside, providing RN phone number to call report, and arranging ambulance transport via [facility] to [facility].  Pt eager to return home following short term rehab. Pt son and pt wife at bedside to provide support during transition to [facility] today. Pt and pt family appreciative of CSW support and assistance.  No further social work needs identified at this time.  CSW signing off.   Alison Murray, MSW, LCSW Clinical Social Work Weekend coverage [phone]

## 2014-06-05 NOTE — Progress Notes (Signed)
CARDIAC REHAB PHASE I   PRE:  Rate/Rhythm: 113 atrial flutter  BP:  Supine:   Sitting: 119/65  Standing:    SaO2: 93% ra  MODE:  Ambulation: 350 ft   POST:  Rate/Rhythem: 118 atrial flutter  BP:  Supine:   Sitting: 119/65  Standing:    SaO2: 97% ra  0905-0920  Pt ambulated in hallway x2 assist using rolling walker.  Pt c/o mild dizziness upon initial walking however resolved with continued ambulation.  Pt returned to chair, chair alarm in place, call light in reach.  Pt reminded to use incentive spirometer 10 x hour, and sternal precautions reinforced.  Pt tends to use arms when rising from bed or chair.  Pt reminded to use pillow and use legs when rising.    Pt verbalized understanding.    Fred Hammes, [place]

## 2014-06-07 ENCOUNTER — Telehealth: Payer: Self-pay | Admitting: *Deleted

## 2014-06-07 NOTE — Telephone Encounter (Signed)
Pt was on TCM list was d/c from hosp 06/05/14 was dx Non-st. Elevation Myocardial Infarction. He will f/u with cardiothoracic surgeon Dr. Servando Snare. Did not schedule TCM appt...[name]

## 2014-06-08 ENCOUNTER — Non-Acute Institutional Stay (SKILLED_NURSING_FACILITY): Payer: Medicare Other | Admitting: Internal Medicine

## 2014-06-08 ENCOUNTER — Encounter: Payer: Self-pay | Admitting: Internal Medicine

## 2014-06-08 DIAGNOSIS — D62 Acute posthemorrhagic anemia: Secondary | ICD-10-CM

## 2014-06-08 DIAGNOSIS — I480 Paroxysmal atrial fibrillation: Secondary | ICD-10-CM

## 2014-06-08 DIAGNOSIS — I119 Hypertensive heart disease without heart failure: Secondary | ICD-10-CM | POA: Diagnosis not present

## 2014-06-08 DIAGNOSIS — R319 Hematuria, unspecified: Secondary | ICD-10-CM | POA: Diagnosis not present

## 2014-06-08 DIAGNOSIS — E785 Hyperlipidemia, unspecified: Secondary | ICD-10-CM

## 2014-06-08 DIAGNOSIS — Z951 Presence of aortocoronary bypass graft: Secondary | ICD-10-CM | POA: Diagnosis not present

## 2014-06-08 DIAGNOSIS — I214 Non-ST elevation (NSTEMI) myocardial infarction: Secondary | ICD-10-CM | POA: Diagnosis not present

## 2014-06-08 NOTE — Progress Notes (Signed)
MRN: [id number] Name: [name]  Sex: male Age: 74 y.o. DOB: 1941/03/29  [facility]: [facility] Facility/Room:114 Level Of Care: SNF Provider: Inocencio Homes D Emergency Contacts: Extended Emergency Contact Information Primary Emergency Contact: Bevans,Geraldine Address: [address]          [place], [place] [address] [place] Phone: [phone] Relation: Spouse Secondary Emergency Contact: Echeverri,Christopher  [place] Phone: [phone] Relation: Son  Code Status:FULL   Allergies: Codeine sulfate  Chief Complaint  Patient presents with  . New Admit To SNF    HPI: Patient is 74 y.o. male who has EXTENSIVE heart hx including recent ardiac arrest admitted to SNF for OT/PT s/p NSTEMI X2 in February and CABG a week ago.  Past Medical History  Diagnosis Date  . CAD (coronary artery disease)     CABG x 2 with LIMA to Circumflex and vein graft to PDA  07/13/1991  . History of transient ischemic attack (TIA)   . Hyperlipidemia   . Peripheral neuropathy   . PVD (peripheral vascular disease) with claudication   . Carotid bruit     bilateral  . Ventral hernia   . Personal history of prostate cancer   . Diverticulosis   . ALLERGIC RHINITIS   . Osteoarthritis   . COPD (chronic obstructive pulmonary disease)   . Tobacco use disorder, continuous   . STEMI (ST elevation myocardial infarction) 04/29/2014    PTCA Lmain and CFX, in setting of VF/VT arrest and CGS    Past Surgical History  Procedure Laterality Date  . Coronary artery bypass graft  1992  . Lumbar laminectomy  [id number]     X2  . Prostatectomy  05/2008    Dr. Alinda Money and XRT  . Appendectomy    . Tonsillectomy    . Left heart catheterization with coronary angiogram N/A 04/30/2014    Procedure: LEFT HEART CATHETERIZATION WITH CORONARY ANGIOGRAM;  Surgeon: Peter M Martinique, MD; Lmain 90% (s/p PTCA), LAD 80%, CFX 100% (s/p PTCA), RCA OK, LIMA-LAD atretic,  SVG-OM 100% (chronic), EF 45%, pt req defib x 13 for VF/VT arrest, CHB w/ tem pacer, IABP and intubation  . Coronary artery bypass graft N/A 05/28/2014    Procedure: REDO CORONARY ARTERY BYPASS GRAFTING (CABG);  Surgeon: Grace Isaac, MD;  Location: [facility];  Service: Open Heart Surgery;  Laterality: N/A;  Times 4 using right internal mammary artery to Intermediate artery and endoscopically harvested left saphenous vein to LAD, OM1, and PD coronary arteries.  Darden Dates without cardioversion N/A 05/28/2014    Procedure: TRANSESOPHAGEAL ECHOCARDIOGRAM (TEE);  Surgeon: Grace Isaac, MD;  Location: [facility];  Service: Open Heart Surgery;  Laterality: N/A;      Medication List       This list is accurate as of: 06/08/14 11:59 PM.  Always use your most recent med list.               acetaminophen 325 MG tablet  Commonly known as:  TYLENOL  Take 975 mg by mouth 3 (three) times daily as needed (pain).     amiodarone 200 MG tablet  Commonly known as:  PACERONE  Take 200 mg by mouth 2 (two) times daily.     apixaban 5 MG Tabs tablet  Commonly known as:  ELIQUIS  Take 1 tablet (5 mg total) by mouth 2 (two) times daily.     atorvastatin 40 MG tablet  Commonly known as:  LIPITOR  Take  1 tablet (40 mg total) by mouth at bedtime.     bisacodyl 10 MG suppository  Commonly known as:  DULCOLAX  Place 1 suppository (10 mg total) rectally daily as needed for moderate constipation.     clopidogrel 75 MG tablet  Commonly known as:  PLAVIX  Take 1 tablet (75 mg total) by mouth daily.     feeding supplement (ENSURE COMPLETE) Liqd  Take 237 mLs by mouth 2 (two) times daily between meals.     ferrous gluconate 324 MG tablet  Commonly known as:  FERGON  Take 1 tablet (324 mg total) by mouth 2 (two) times daily with a meal.     fluticasone 50 MCG/ACT nasal spray  Commonly known as:  FLONASE  Place 1 spray into both nostrils 2 (two) times daily.     folic acid 1 MG tablet  Commonly known as:   FOLVITE  Take 1 tablet (1 mg total) by mouth daily.     loratadine 10 MG tablet  Commonly known as:  CLARITIN  Take 10 mg by mouth daily as needed for allergies.     magnesium hydroxide 400 MG/5ML suspension  Commonly known as:  MILK OF MAGNESIA  Take 30 mLs by mouth daily as needed for mild constipation.     metoprolol tartrate 25 MG tablet  Commonly known as:  LOPRESSOR  Take 0.5 tablets (12.5 mg total) by mouth 2 (two) times daily.     nitroGLYCERIN 0.4 MG SL tablet  Commonly known as:  NITROSTAT  Place 0.4 mg under the tongue every 5 (five) minutes as needed for chest pain.     omeprazole 20 MG capsule  Commonly known as:  PRILOSEC  Take 20 mg by mouth daily.     oxyCODONE 5 MG immediate release tablet  Commonly known as:  Oxy IR/ROXICODONE  Take 1-2 tablets (5-10 mg total) by mouth every 3 (three) hours as needed for severe pain.     phenol 1.4 % Liqd  Commonly known as:  CHLORASEPTIC  Use as directed 1 spray in the mouth or throat 3 (three) times daily as needed for throat irritation / pain.     traMADol 50 MG tablet  Commonly known as:  ULTRAM  Take 1-2 tablets (50-100 mg total) by mouth every 4 (four) hours as needed for moderate pain.        Meds ordered this encounter  Medications  . amiodarone (PACERONE) 200 MG tablet    Sig: Take 200 mg by mouth 2 (two) times daily.    Immunization History  Administered Date(s) Administered  . H1N1 03/16/2008  . Influenza Split 02/20/2012  . Influenza Whole 01/07/2008, 02/05/2008, 02/01/2009, 01/25/2010  . Influenza,inj,Quad PF,36+ Mos 12/24/2012, 01/11/2014  . Pneumococcal Conjugate-13 04/14/2014  . Pneumococcal Polysaccharide-23 04/23/2005  . Td 02/20/2010  . Tdap 07/08/2012    History  Substance Use Topics  . Smoking status: Current Every Day Smoker -- 40 years    Types: Cigarettes  . Smokeless tobacco: Not on file  . Alcohol Use: No    Family history is noncontributory    Review of Systems  DATA  OBTAINED: from patient, nurse; no c/o during interview  SEE under A Fib GENERAL:  no fevers, fatigue, appetite changes SKIN: No itching, rash or wounds EYES: No eye pain, redness, discharge EARS: No earache, tinnitus, change in hearing NOSE: No congestion, drainage or bleeding  MOUTH/THROAT: No mouth or tooth pain, No sore throat RESPIRATORY: No cough, wheezing, SOB CARDIAC: No  chest pain, palpitations, lower extremity edema  GI: No abdominal pain, No N/V/D or constipation, No heartburn or reflux  GU: No dysuria, frequency or urgency, or incontinence  MUSCULOSKELETAL: No unrelieved bone/joint pain NEUROLOGIC: No headache, dizziness or focal weakness PSYCHIATRIC: No overt anxiety or sadness, No behavior issue.   Filed Vitals:   06/08/14 1420  BP: 108/65  Pulse: 98  Temp: 98.5 F (36.9 C)  Resp: 20    Physical Exam  GENERAL APPEARANCE: Alert, conversant,  No acute distress.  SKIN: No diaphoresis rash HEAD: Normocephalic, atraumatic  EYES: Conjunctiva/lids clear. Pupils round, reactive. EOMs intact.  EARS: External exam WNL, canals clear. Hearing grossly normal.  NOSE: No deformity or discharge.  MOUTH/THROAT: Lips w/o lesions  RESPIRATORY: Breathing is even, unlabored. Lung sounds are clear   CARDIOVASCULAR: Heart irreg no murmurs, rubs or gallops. No peripheral edema.   GASTROINTESTINAL: Abdomen is soft, non-tender, not distended w/ normal bowel sounds. GENITOURINARY: Bladder non tender, not distended  MUSCULOSKELETAL: No abnormal joints or musculature NEUROLOGIC:  Cranial nerves 2-12 grossly intact. Moves all extremities  PSYCHIATRIC:  Mild to mod vague, no behavioral issues  Patient Active Problem List   Diagnosis Date Noted  . Acute blood loss anemia 06/09/2014  . Hypertensive heart disease 06/09/2014  . Hematuria 05/31/2014  . S/P CABG x 4 05/28/2014  . PAF- on Amiodarone 05/25/2014  . Chronic anticoagulation-on Eliquis at discharge 05/11/14 05/25/2014  . ICM-EF  45-50% by echo 04/30/14 05/25/2014  . Protein-calorie malnutrition, severe 05/25/2014  . NSTEMI (non-ST elevated myocardial infarction) 05/21/2014  . Encephalopathy 05/14/2014  . Acute respiratory acidosis   . S/P CABG x 2 05/04/2014  . Acute respiratory failure   . STEMI 05/04/14 with shock, CPR, CHB, VT 04/30/2014  . Cardiogenic shock 04/30/2014  . Cardiac arrest 04/30/2014  . VT (ventricular tachycardia) 04/30/2014  . CHB (complete heart block) 04/30/2014  . CAD S/P LM and CFX PCI 05/04/14 04/30/2014  . Wart viral 04/18/2014  . Laceration of index finger 12/04/2012  . Hand eczema 05/26/2012  . Bruising 02/20/2012  . Actinic keratoses 11/14/2011  . PAD (peripheral artery disease) 08/28/2011  . Constipation 07/18/2011  . Sinusitis acute 04/18/2011  . Ventral hernia 01/03/2011  . SWEATING 06/26/2010  . History of transient ischemic attack (TIA)   . CERUMEN IMPACTION 02/20/2010  . DIVERTICULOSIS-COLON 11/01/2009  . RECTAL BLEEDING 11/01/2009  . ABDOMINAL BLOATING 11/01/2009  . ABNORMAL BOWEL SOUNDS 11/01/2009  . ABDOMINAL PAIN-RLQ 11/01/2009  . ABDOMINAL PAIN-LLQ 11/01/2009  . ABDOMINAL PAIN, GENERALIZED 11/01/2009  . ABDOMINAL PAIN, LOWER 11/01/2009  . Rash and nonspecific skin eruption 07/11/2009  . TRANSIENT ISCHEMIC ATTACK, HX OF 03/22/2009  . PVD WITH CLAUDICATION 03/14/2009  . BRONCHITIS, ACUTE 11/26/2008  . Personal history of malignant neoplasm of prostate 08/02/2008  . TOBACCO USE DISORDER/SMOKER-SMOKING CESSATION DISCUSSED 12/08/2007  . ALLERGIC RHINITIS 08/06/2007  . ELEVATED PROSTATE SPECIFIC ANTIGEN 08/06/2007  . Hyperlipidemia 04/22/2007  . Hereditary and idiopathic peripheral neuropathy 04/22/2007  . UPPER RESPIRATORY INFECTION (URI) 04/22/2007  . COPD exacerbation 04/22/2007  . OSTEOARTHRITIS 04/22/2007  . LOW BACK PAIN 04/22/2007    CBC    Component Value Date/Time   WBC 5.5 06/02/2014 0426   WBC 7.6 12/01/2008 1247   RBC 2.53* 06/02/2014 0426   RBC  3.84* 12/01/2008 1247   HGB 8.1* 06/02/2014 0426   HGB 13.2 12/01/2008 1247   HCT 23.5* 06/02/2014 0426   HCT 38.3* 12/01/2008 1247   PLT 154 06/02/2014 0426   PLT 205 12/01/2008  1247   MCV 92.9 06/02/2014 0426   MCV 99.9* 12/01/2008 1247   LYMPHSABS 1.7 04/29/2014 2130   LYMPHSABS 0.7* 12/01/2008 1247   MONOABS 0.9 04/29/2014 2130   MONOABS 0.8 12/01/2008 1247   EOSABS 0.6 04/29/2014 2130   EOSABS 0.5 12/01/2008 1247   BASOSABS 0.0 04/29/2014 2130   BASOSABS 0.0 12/01/2008 1247    CMP     Component Value Date/Time   NA 132* 06/02/2014 0426   K 4.1 06/02/2014 0426   CL 98 06/02/2014 0426   CO2 28 06/02/2014 0426   GLUCOSE 115* 06/02/2014 0426   GLUCOSE 114* 04/22/2006 0854   BUN 10 06/02/2014 0426   CREATININE 0.82 06/02/2014 0426   CALCIUM 8.3* 06/02/2014 0426   PROT 6.2 05/27/2014 0437   ALBUMIN 3.7 05/27/2014 0437   AST 24 05/27/2014 0437   ALT 30 05/27/2014 0437   ALKPHOS 56 05/27/2014 0437   BILITOT 0.6 05/27/2014 0437   GFRNONAA 84* 06/02/2014 0426   GFRAA >90 06/02/2014 0426    Assessment and Plan  NSTEMI (non-ST elevated myocardial infarction) The second one within 2 weeks.Presented from SNF with CP , atrial flutter, converted on amiodarone,  and mildly elevated troponin. Pt was loaded with plavix and started on hepatin and evaled for CABG.   S/P CABG x 4 This episode pt felt to be well enough for CABG which was performed.Posg op problems with atrial fib, kept recurring.   PAF- on Amiodarone Post-op afib, converted with IV then po amiodarone and low dose BBlocker. Recurrent a fib/flutter was tx with IV amiodarone again and diltiazem which ultimately caused hypotension and was d/c. Plavix was restarted then stopped due to fall risk and eliquis restarted. If afib continues cardioversion needs to be considered.  In SNF this am pt had an episode of pre-syncope with HR up to 128. Pt was put to bed VS monitored, had no CP and recovered enough to go back to the  bicycle with q 2 min monitoring  and did fine. HR in second PT session was irregular, earlier episode was most likely afib with close to if not RVR which resolved.Pt staff and nursing made aware so they can obs pt for future episodes.   Hematuria Developed with plavix and heparin and they were d/c and hematuria resolved.   Acute blood loss anemia D/c Hb was 8.1; it looks like pt received 1 unit of blood. Plan ;continue folate and iron;monitor   Hyperlipidemia Pt on lipitor 40 mg s/p CABG; 05/2014 LFTs are normal   Hypertensive heart disease Controlled on metoprolol 25 mg BID;pt has had some problem with hypotension post -op     Hennie Duos, MD

## 2014-06-08 NOTE — Assessment & Plan Note (Signed)
The second one within 2 weeks.Presented from SNF with CP , atrial flutter, converted on amiodarone,  and mildly elevated troponin. Pt was loaded with plavix and started on hepatin and evaled for CABG.

## 2014-06-08 NOTE — Assessment & Plan Note (Signed)
This episode pt felt to be well enough for CABG which was performed.Posg op problems with atrial fib, kept recurring.

## 2014-06-08 NOTE — Assessment & Plan Note (Signed)
Developed with plavix and heparin and they were d/c and hematuria resolved.

## 2014-06-08 NOTE — Assessment & Plan Note (Signed)
Post-op afib, converted with IV then po amiodarone and low dose BBlocker. Recurrent a fib/flutter was tx with IV amiodarone again and diltiazem which ultimately caused hypotension and was d/c. Plavix was restarted then stopped due to fall risk and eliquis restarted. If afib continues cardioversion needs to be considered.  In SNF this am pt had an episode of pre-syncope with HR up to 128. Pt was put to bed VS monitored, had no CP and recovered enough to go back to the bicycle with q 2 min monitoring  and did fine. HR in second PT session was irregular, earlier episode was most likely afib with close to if not RVR which resolved.Pt staff and nursing made aware so they can obs pt for future episodes.

## 2014-06-09 DIAGNOSIS — I119 Hypertensive heart disease without heart failure: Secondary | ICD-10-CM | POA: Insufficient documentation

## 2014-06-09 DIAGNOSIS — D62 Acute posthemorrhagic anemia: Secondary | ICD-10-CM | POA: Insufficient documentation

## 2014-06-09 NOTE — Assessment & Plan Note (Signed)
Controlled on metoprolol 25 mg BID;pt has had some problem with hypotension post -op

## 2014-06-09 NOTE — Assessment & Plan Note (Signed)
D/c Hb was 8.1; it looks like pt received 1 unit of blood. Plan ;continue folate and iron;monitor

## 2014-06-09 NOTE — Assessment & Plan Note (Signed)
Pt on lipitor 40 mg s/p CABG; 05/2014 LFTs are normal

## 2014-06-14 ENCOUNTER — Encounter: Payer: Self-pay | Admitting: Internal Medicine

## 2014-06-14 ENCOUNTER — Non-Acute Institutional Stay (SKILLED_NURSING_FACILITY): Payer: Medicare Other | Admitting: Internal Medicine

## 2014-06-14 DIAGNOSIS — I2510 Atherosclerotic heart disease of native coronary artery without angina pectoris: Secondary | ICD-10-CM

## 2014-06-14 DIAGNOSIS — Z9861 Coronary angioplasty status: Secondary | ICD-10-CM | POA: Diagnosis not present

## 2014-06-14 DIAGNOSIS — M25511 Pain in right shoulder: Secondary | ICD-10-CM

## 2014-06-14 DIAGNOSIS — I472 Ventricular tachycardia, unspecified: Secondary | ICD-10-CM

## 2014-06-14 DIAGNOSIS — M25519 Pain in unspecified shoulder: Secondary | ICD-10-CM | POA: Insufficient documentation

## 2014-06-14 NOTE — Progress Notes (Signed)
Patient ID: [name], male   DOB: Nov 10, 1940, 74 y.o.   MRN: [id number]   this is an acute visit.  Level care skilled.  [facility].   Chief Complaint  Patient presents with  .  acute visit follow-up right shoulder discomfort-    HPI: Patient is 74 y.o. male who has EXTENSIVE heart hx including recent ardiac arrest admitted to SNF for OT/PT s/p NSTEMI X2 in February and CABG a week ago He has been relatively stable with this although he does have somewhat persistent tachycardia up to 120 beats a minute-cardiology is aware of this and he will be seeing them later this week  He is on amiodarone 200 mg a day as well as Lopressor 12.5 mg twice a day  Apparently he does have some history of hypotension although appears to be stable today I got 122/60 manually I see previous readings 113/72-116/67.   He is also apparently had some increased edema cardiology did start him on low dose Lasix.  Currently he does not complain of any chest pain or shortness of breath has complained of some right shoulder pain for last several days states he fell in the hospital.  We did obtain an x-ray which only shows degenerative changes.  Past Medical History  Diagnosis Date  . CAD (coronary artery disease)     CABG x 2 with LIMA to Circumflex and vein graft to PDA  07/13/1991  . History of transient ischemic attack (TIA)   . Hyperlipidemia   . Peripheral neuropathy   . PVD (peripheral vascular disease) with claudication   . Carotid bruit     bilateral  . Ventral hernia   . Personal history of prostate cancer   . Diverticulosis   . ALLERGIC RHINITIS   . Osteoarthritis   . COPD (chronic obstructive pulmonary disease)   . Tobacco use disorder, continuous   . STEMI (ST elevation myocardial infarction) 04/29/2014    PTCA Lmain and CFX, in setting of VF/VT arrest and CGS    Past Surgical History  Procedure Laterality Date  . Coronary artery bypass graft  1992  . Lumbar  laminectomy  [id number]     X2  . Prostatectomy  05/2008    Dr. Alinda Money and XRT  . Appendectomy    . Tonsillectomy    . Left heart catheterization with coronary angiogram N/A 04/30/2014    Procedure: LEFT HEART CATHETERIZATION WITH CORONARY ANGIOGRAM;  Surgeon: Peter M Martinique, MD; Lmain 90% (s/p PTCA), LAD 80%, CFX 100% (s/p PTCA), RCA OK, LIMA-LAD atretic, SVG-OM 100% (chronic), EF 45%, pt req defib x 13 for VF/VT arrest, CHB w/ tem pacer, IABP and intubation  . Coronary artery bypass graft N/A 05/28/2014    Procedure: REDO CORONARY ARTERY BYPASS GRAFTING (CABG);  Surgeon: Grace Isaac, MD;  Location: [facility];  Service: Open Heart Surgery;  Laterality: N/A;  Times 4 using right internal mammary artery to Intermediate artery and endoscopically harvested left saphenous vein to LAD, OM1, and PD coronary arteries.  Darden Dates without cardioversion N/A 05/28/2014    Procedure: TRANSESOPHAGEAL ECHOCARDIOGRAM (TEE);  Surgeon: Grace Isaac, MD;  Location: [facility];  Service: Open Heart Surgery;  Laterality: N/A;      Medication List                      acetaminophen 325 MG tablet  Commonly known as:  TYLENOL  Take 975 mg by mouth 3 (three) times daily as needed (  pain).     amiodarone 200 MG tablet  Commonly known as:  PACERONE  Take 200 mg by mouth 2 (two) times daily.     apixaban 5 MG Tabs tablet  Commonly known as:  ELIQUIS  Take 1 tablet (5 mg total) by mouth 2 (two) times daily.     atorvastatin 40 MG tablet  Commonly known as:  LIPITOR  Take 1 tablet (40 mg total) by mouth at bedtime.     bisacodyl 10 MG suppository  Commonly known as:  DULCOLAX  Place 1 suppository (10 mg total) rectally daily as needed for moderate constipation.     clopidogrel 75 MG tablet  Commonly known as:  PLAVIX  Take 1 tablet (75 mg total) by mouth daily.     feeding supplement (ENSURE COMPLETE) Liqd  Take 237 mLs by mouth 2 (two) times daily between meals.     ferrous gluconate 324 MG tablet    Commonly known as:  FERGON  Take 1 tablet (324 mg total) by mouth 2 (two) times daily with a meal.     fluticasone 50 MCG/ACT nasal spray  Commonly known as:  FLONASE  Place 1 spray into both nostrils 2 (two) times daily.     folic acid 1 MG tablet  Commonly known as:  FOLVITE  Take 1 tablet (1 mg total) by mouth daily.     loratadine 10 MG tablet  Commonly known as:  CLARITIN  Take 10 mg by mouth daily as needed for allergies.     magnesium hydroxide 400 MG/5ML suspension  Commonly known as:  MILK OF MAGNESIA  Take 30 mLs by mouth daily as needed for mild constipation.     metoprolol tartrate 25 MG tablet  Commonly known as:  LOPRESSOR  Take 0.5 tablets (12.5 mg total) by mouth 2 (two) times daily.     nitroGLYCERIN 0.4 MG SL tablet  Commonly known as:  NITROSTAT  Place 0.4 mg under the tongue every 5 (five) minutes as needed for chest pain.     omeprazole 20 MG capsule  Commonly known as:  PRILOSEC  Take 20 mg by mouth daily.     oxyCODONE 5 MG immediate release tablet  Commonly known as:  Oxy IR/ROXICODONE  Take 1-2 tablets (5-10 mg total) by mouth every 3 (three) hours as needed for severe pain.     phenol 1.4 % Liqd  Commonly known as:  CHLORASEPTIC  Use as directed 1 spray in the mouth or throat 3 (three) times daily as needed for throat irritation / pain.     traMADol 50 MG tablet  Commonly known as:  ULTRAM  Take 1-2 tablets (50-100 mg total) by mouth every 4 (four) hours as needed for moderate pain.    Lasix 20 mg daily.                    Immunization History  Administered Date(s) Administered  . H1N1 03/16/2008  . Influenza Split 02/20/2012  . Influenza Whole 01/07/2008, 02/05/2008, 02/01/2009, 01/25/2010  . Influenza,inj,Quad PF,36+ Mos 12/24/2012, 01/11/2014  . Pneumococcal Conjugate-13 04/14/2014  . Pneumococcal Polysaccharide-23 04/23/2005  . Td 02/20/2010  . Tdap 07/08/2012    History  Substance Use Topics  . Smoking status:  Current Every Day Smoker -- 40 years    Types: Cigarettes  . Smokeless tobacco: Not on file  . Alcohol Use: No    Family history is noncontributory    Review of Systems  DATA OBTAINED: from  patient, nurse;   GENERAL:  no fevers, fatigue, appetite changes SKIN: No itching, rash or wounds EYES: No eye pain, redness, discharge EARS: No earache, tinnitus, change in hearing NOSE: No congestion, drainage or bleeding  MOUTH/THROAT: No mouth or tooth pain, No sore throat RESPIRATORY: No cough, wheezing, SOB CARDIAC: No chest pain, palpitations, lower extremity edema  GI: No abdominal pain, No N/V/D or constipation, No heartburn or reflux  GU: No dysuria, frequency or urgency, or incontinence  MUSCULOSKELETAL: complain of right shoulder discomfort as noted above NEUROLOGIC: No headache, dizziness or focal weakness PSYCHIATRIC: No overt anxiety or sadness, No behavior issue.                       Physical Exam Temperature 96.8 pulse 112 respirations 20 blood pressure 122/60 taken manually  GENERAL APPEARANCE: Alert, conversant,  No acute distress. Sitting comfortably in his chair  SKIN: No diaphoresis rash HEAD: Normocephalic, atraumatic  EYES: Conjunctiva/lids clear. Pupils round, reactive. EOMs intact.  EARS: External exam WNL, canals clear. Hearing grossly normal.  NOSE: No deformity or discharge.  MOUTH/THROAT: Lips w/o lesions  RESPIRATORY: Breathing is even, unlabored. Lung sounds are clear   CARDIOVASCULAR: Heart rate and rhythm somewhat tachycardic no murmurs, rubs or gallops. Trace leg edema I with say one plus pedal edema .   GASTROINTESTINAL: Abdomen is soft, non-tender, not distended w/ normal bowel sounds. GENITOURINARY: Bladder non tender, not distended  MUSCULOSKELETAL: No abnormal joints or musculature--is able to move right shoulder lift his arm but he does have some discomfort here I do not note any deformity right shoulder area--there is some mild tenderness  to palpation anterior to the shoulder upper thorax area NEUROLOGIC:  Cranial nerves 2-12 grossly intact. Moves all extremities  PSYCHIATRIC:  Mild to mod vague, no behavioral issues  Patient Active Problem List   Diagnosis Date Noted  . Acute blood loss anemia 06/09/2014  . Hypertensive heart disease 06/09/2014  . Hematuria 05/31/2014  . S/P CABG x 4 05/28/2014  . PAF- on Amiodarone 05/25/2014  . Chronic anticoagulation-on Eliquis at discharge 05/11/14 05/25/2014  . ICM-EF 45-50% by echo 04/30/14 05/25/2014  . Protein-calorie malnutrition, severe 05/25/2014  . NSTEMI (non-ST elevated myocardial infarction) 05/21/2014  . Encephalopathy 05/14/2014  . Acute respiratory acidosis   . S/P CABG x 2 05/04/2014  . Acute respiratory failure   . STEMI 05/04/14 with shock, CPR, CHB, VT 04/30/2014  . Cardiogenic shock 04/30/2014  . Cardiac arrest 04/30/2014  . VT (ventricular tachycardia) 04/30/2014  . CHB (complete heart block) 04/30/2014  . CAD S/P LM and CFX PCI 05/04/14 04/30/2014  . Wart viral 04/18/2014  . Laceration of index finger 12/04/2012  . Hand eczema 05/26/2012  . Bruising 02/20/2012  . Actinic keratoses 11/14/2011  . PAD (peripheral artery disease) 08/28/2011  . Constipation 07/18/2011  . Sinusitis acute 04/18/2011  . Ventral hernia 01/03/2011  . SWEATING 06/26/2010  . History of transient ischemic attack (TIA)   . CERUMEN IMPACTION 02/20/2010  . DIVERTICULOSIS-COLON 11/01/2009  . RECTAL BLEEDING 11/01/2009  . ABDOMINAL BLOATING 11/01/2009  . ABNORMAL BOWEL SOUNDS 11/01/2009  . ABDOMINAL PAIN-RLQ 11/01/2009  . ABDOMINAL PAIN-LLQ 11/01/2009  . ABDOMINAL PAIN, GENERALIZED 11/01/2009  . ABDOMINAL PAIN, LOWER 11/01/2009  . Rash and nonspecific skin eruption 07/11/2009  . TRANSIENT ISCHEMIC ATTACK, HX OF 03/22/2009  . PVD WITH CLAUDICATION 03/14/2009  . BRONCHITIS, ACUTE 11/26/2008  . Personal history of malignant neoplasm of prostate 08/02/2008  . TOBACCO USE  DISORDER/SMOKER-SMOKING  CESSATION DISCUSSED 12/08/2007  . ALLERGIC RHINITIS 08/06/2007  . ELEVATED PROSTATE SPECIFIC ANTIGEN 08/06/2007  . Hyperlipidemia 04/22/2007  . Hereditary and idiopathic peripheral neuropathy 04/22/2007  . UPPER RESPIRATORY INFECTION (URI) 04/22/2007  . COPD exacerbation 04/22/2007  . OSTEOARTHRITIS 04/22/2007  . LOW BACK PAIN 04/22/2007  Labs.  06/11/2014.  WBC 4.9 hemoglobin 9.4 platelets 446    CBC    Component Value Date/Time   WBC 5.5 06/02/2014 0426   WBC 7.6 12/01/2008 1247   RBC 2.53* 06/02/2014 0426   RBC 3.84* 12/01/2008 1247   HGB 8.1* 06/02/2014 0426   HGB 13.2 12/01/2008 1247   HCT 23.5* 06/02/2014 0426   HCT 38.3* 12/01/2008 1247   PLT 154 06/02/2014 0426   PLT 205 12/01/2008 1247   MCV 92.9 06/02/2014 0426   MCV 99.9* 12/01/2008 1247   LYMPHSABS 1.7 04/29/2014 2130   LYMPHSABS 0.7* 12/01/2008 1247   MONOABS 0.9 04/29/2014 2130   MONOABS 0.8 12/01/2008 1247   EOSABS 0.6 04/29/2014 2130   EOSABS 0.5 12/01/2008 1247   BASOSABS 0.0 04/29/2014 2130   BASOSABS 0.0 12/01/2008 1247    CMP     Component Value Date/Time   NA 132* 06/02/2014 0426   K 4.1 06/02/2014 0426   CL 98 06/02/2014 0426   CO2 28 06/02/2014 0426   GLUCOSE 115* 06/02/2014 0426   GLUCOSE 114* 04/22/2006 0854   BUN 10 06/02/2014 0426   CREATININE 0.82 06/02/2014 0426   CALCIUM 8.3* 06/02/2014 0426   PROT 6.2 05/27/2014 0437   ALBUMIN 3.7 05/27/2014 0437   AST 24 05/27/2014 0437   ALT 30 05/27/2014 0437   ALKPHOS 56 05/27/2014 0437   BILITOT 0.6 05/27/2014 0437   GFRNONAA 84* 06/02/2014 0426   GFRAA >90 06/02/2014 0426    Assessment and Plan  NSTEMI (non-ST elevated myocardial infarction) The second one within 2 weeks.Presented from SNF with CP , atrial flutter, converted on amiodarone,  and mildly elevated troponin. Pt was loaded with plavix and started on hepatin and evaled for CABG.   S/P CABG x 4 This episode pt felt to be well enough for CABG  which was performed.Posg op problems with atrial fib, kept recurring.-Still continues with tachycardia cardiology is aware of this and will follow up clinically he appears to be stable blood pressure from what I can see is stable  I do note cardiology has started low-dose Lasix-will have to monitor electrolytes will check a BMP to ensure stability of renal function and electrolytes   PAF- on Amiodarone Post-op afib, converted with IV then po amiodarone and low dose BBlocker. Recurrent a fib/flutter was tx with IV amiodarone again and diltiazem which ultimately caused hypotension and was d/c. Plavix was restarted then stopped due to fall risk and eliquis restarted--since then he has been restarted on Plavix. If afib continues cardioversion needs to be considered.--Cardiology is following up on this  .   Hematuria Developed with plavix and heparin and they were d/c and hematuria resolved.--Since that Plavix has been restarted   Acute blood loss anemia D/c Hb was 8.1; it looks like pt received 1 unit of blood. Plan ;continue folate and iron;monitor-BC shows improvement it appears cardiology has ordered serial CBGs to keep an eye on this--hemoglobin has improved on most recent lab to 9.4   Hyperlipidemia Pt on lipitor 40 mg s/p CABG; 05/2014 LFTs are normal   Hypertensive heart disease Controlled on metoprolol 25 mg BID;pt has had some problem with hypotension post -op--although this appears to  have stabilized.  Right shoulder discomfort-patient does receive tramadol for pain relief he does have a codeine allergy he did have oxycodone but apparently this is contraindicated will have to monitor-according nursing he did receive at least 1 dose of oxycodone and it appeared to help may have to consider this if pain is not relieved with the tramadol-it appears she probably received the oxycodone in the hospital again we will have to monitor.  X-ray s did show degenerative changes-there also could  be some muscle strain involved here as well-at this point will monitor  [id number] Of note greater than 30 minutes spent assessing patient-reviewing his chart-and coordinating and formulating a plan of care for numerous diagnoses-of note greater than 50% of time spent coordinating plan of care

## 2014-06-15 ENCOUNTER — Other Ambulatory Visit: Payer: Self-pay

## 2014-06-17 ENCOUNTER — Ambulatory Visit (INDEPENDENT_AMBULATORY_CARE_PROVIDER_SITE_OTHER): Payer: Medicare Other | Admitting: Physician Assistant

## 2014-06-17 ENCOUNTER — Non-Acute Institutional Stay (SKILLED_NURSING_FACILITY): Payer: Medicare Other | Admitting: Internal Medicine

## 2014-06-17 ENCOUNTER — Encounter: Payer: Self-pay | Admitting: Physician Assistant

## 2014-06-17 VITALS — BP 120/70 | HR 112 | Ht 71.0 in | Wt 156.0 lb

## 2014-06-17 DIAGNOSIS — I110 Hypertensive heart disease with heart failure: Secondary | ICD-10-CM | POA: Diagnosis not present

## 2014-06-17 DIAGNOSIS — I255 Ischemic cardiomyopathy: Secondary | ICD-10-CM | POA: Diagnosis not present

## 2014-06-17 DIAGNOSIS — E785 Hyperlipidemia, unspecified: Secondary | ICD-10-CM | POA: Diagnosis not present

## 2014-06-17 DIAGNOSIS — I484 Atypical atrial flutter: Secondary | ICD-10-CM | POA: Diagnosis not present

## 2014-06-17 DIAGNOSIS — I509 Heart failure, unspecified: Secondary | ICD-10-CM | POA: Diagnosis not present

## 2014-06-17 DIAGNOSIS — I5022 Chronic systolic (congestive) heart failure: Secondary | ICD-10-CM

## 2014-06-17 DIAGNOSIS — Z951 Presence of aortocoronary bypass graft: Secondary | ICD-10-CM

## 2014-06-17 DIAGNOSIS — K219 Gastro-esophageal reflux disease without esophagitis: Secondary | ICD-10-CM | POA: Diagnosis not present

## 2014-06-17 DIAGNOSIS — M150 Primary generalized (osteo)arthritis: Secondary | ICD-10-CM | POA: Diagnosis not present

## 2014-06-17 DIAGNOSIS — I2510 Atherosclerotic heart disease of native coronary artery without angina pectoris: Secondary | ICD-10-CM

## 2014-06-17 DIAGNOSIS — I4891 Unspecified atrial fibrillation: Secondary | ICD-10-CM

## 2014-06-17 DIAGNOSIS — M159 Polyosteoarthritis, unspecified: Secondary | ICD-10-CM

## 2014-06-17 LAB — BASIC METABOLIC PANEL
BUN: 14 mg/dL (ref 6–23)
CHLORIDE: 95 meq/L — AB (ref 96–112)
CO2: 31 mEq/L (ref 19–32)
Calcium: 9.4 mg/dL (ref 8.4–10.5)
Creatinine, Ser: 1 mg/dL (ref 0.40–1.50)
GFR: 77.33 mL/min (ref >60.00)
Glucose, Bld: 117 mg/dL — ABNORMAL HIGH (ref 70–99)
Potassium: 4.6 mEq/L (ref 3.5–5.1)
Sodium: 131 mEq/L — ABNORMAL LOW (ref 135–145)

## 2014-06-17 MED ORDER — AMIODARONE HCL 400 MG PO TABS: 400.0000 mg | ORAL_TABLET | Freq: Two times a day (BID) | ORAL | Status: DC

## 2014-06-17 MED ORDER — CLOPIDOGREL BISULFATE 75 MG PO TABS: 75.0000 mg | ORAL_TABLET | Freq: Every day | ORAL | Status: DC

## 2014-06-17 MED ORDER — PANTOPRAZOLE SODIUM 40 MG PO TBEC: 40.0000 mg | DELAYED_RELEASE_TABLET | Freq: Every day | ORAL | Status: DC

## 2014-06-17 MED ORDER — APIXABAN 5 MG PO TABS: 5.0000 mg | ORAL_TABLET | Freq: Two times a day (BID) | ORAL | Status: DC

## 2014-06-17 MED ORDER — FUROSEMIDE 20 MG PO TABS: 20.0000 mg | ORAL_TABLET | Freq: Every day | ORAL | Status: DC

## 2014-06-17 NOTE — Progress Notes (Signed)
Cardiology Office Note   Date:  06/17/2014   ID:  JAYVIER BURGHER, DOB [birth year], MRN [id number]  PCP:  Walker Kehr, MD  Cardiologist:  Dr. Lauree Chandler     Chief Complaint  Patient presents with  . Coronary Artery Disease    s/p CABG  . Hospitalization Follow-up     History of Present Illness: [name] is a 74 y.o. male with a hx of CAD, s/p CABG in 1993, HL, PAD s/p stent to L SFA 5/11 and R SFA PTA6/11. In January of this year, the patient was admitted to [facility] with a NSTEMI, atrial fibrillation with RVR. At that time, catheterization was performed and showed a critical left main, LAD, and CX lesions with a left dominant system. S-OM was occluded and L-LAD was atretic.  He developed cardiogenic shock requiring balloon pump and IV pressor support. He also suffered recurrent V- Fib, with CPR and multiple defibrillations performed. He required ventilator support for respiratory failure. He underwent partially successful PTCA of the distal left main and ostial circumflex. During that hospitalization, a TCTS consult was obtained for consideration of redo CABG, but patient was not felt to be a candidate at that time due to deconditioning. He was discharged to a nursing home on 05/14/2014. He initially did okay, however, he presented back to [facility] with recurrent chest pain. Admitted 2/11-2/27.  He was again found to have a mildly elevated troponin and to be in atrial flutter. He converted to NSR with Amiodarone and was seen by Dr. Servando Snare.  Decision was made to pursue re-do CABG (RIMA-Intermediate, S-OM, S-PDA, S-dist LAD).  Postop course was notable for recurrent AFib/Flutter and was continued on Amiodarone.  He was placed on Eliquis for stroke prophylaxis.  He was DC to SNF for rehab.  He returns for FU.  He is staying at [facility].  He believes he will be DC tomorrow.  He was recently placed on Lasix for LE edema.  This is  improved.  He can tell his HR is elevated. He feels fatigued.  He notes DOE.  He is NYHA 2b.  Chest is still sore.  Denies orthopnea, PND.  Denies cough, wheezing, fevers, chills.     Studies/Reports Reviewed Today:  DC Summary, progress notes, Labs, Xrays from the hospital.   Echo 04/30/14 - Inferobasal and posterior lateral hypokinesis.  EF 45% to 50%. - Atrial septum: No defect or patent foramen ovale was identified. - PA peak pressure: 38 mm Hg (S).  Cardiac cath 04/2014 LM: 90% distal  LAD: 80% ostial followed by 2 aneurysmal segments.  LCx: occluded. There is a 95% ostial LCx stenosis.   RCA: patent.  SVG to OM is occluded chronically. LIMA to the LAD is atretic.   EF:  Severe inferior wall hypokinesis.  LVEF 45% PCI Data:  Left main/Segment - distal/ ostial LCx PTCA only Partially successful PTCA only of the distal left main and ostial LCx.    Carotid US 05/25/14 Bilateral -1% to 39% ICA stenosis   Past Medical History  Diagnosis Date  . CAD (coronary artery disease)     CABG x 2 with LIMA to Circumflex and vein graft to PDA  07/13/1991  . History of transient ischemic attack (TIA)   . Hyperlipidemia   . Peripheral neuropathy   . PVD (peripheral vascular disease) with claudication   . Carotid bruit     bilateral  . Ventral hernia   . Personal history  of prostate cancer   . Diverticulosis   . ALLERGIC RHINITIS   . Osteoarthritis   . COPD (chronic obstructive pulmonary disease)   . Tobacco use disorder, continuous   . STEMI (ST elevation myocardial infarction) 04/29/2014    PTCA Lmain and CFX, in setting of VF/VT arrest and CGS    Past Surgical History  Procedure Laterality Date  . Coronary artery bypass graft  1992  . Lumbar laminectomy  [id number]     X2  . Prostatectomy  05/2008    Dr. Alinda Money and XRT  . Appendectomy    . Tonsillectomy    . Left heart catheterization with coronary angiogram N/A 04/30/2014    Procedure: LEFT HEART CATHETERIZATION WITH CORONARY  ANGIOGRAM;  Surgeon: Peter M Martinique, MD; Lmain 90% (s/p PTCA), LAD 80%, CFX 100% (s/p PTCA), RCA OK, LIMA-LAD atretic, SVG-OM 100% (chronic), EF 45%, pt req defib x 13 for VF/VT arrest, CHB w/ tem pacer, IABP and intubation  . Coronary artery bypass graft N/A 05/28/2014    Procedure: REDO CORONARY ARTERY BYPASS GRAFTING (CABG);  Surgeon: Grace Isaac, MD;  Location: [place];  Service: Open Heart Surgery;  Laterality: N/A;  Times 4 using right internal mammary artery to Intermediate artery and endoscopically harvested left saphenous vein to LAD, OM1, and PD coronary arteries.  Darden Dates without cardioversion N/A 05/28/2014    Procedure: TRANSESOPHAGEAL ECHOCARDIOGRAM (TEE);  Surgeon: Grace Isaac, MD;  Location: [place];  Service: Open Heart Surgery;  Laterality: N/A;     Current Outpatient Prescriptions  Medication Sig Dispense Refill  . acetaminophen (TYLENOL) 325 MG tablet Take 975 mg by mouth 3 (three) times daily as needed (pain).    Marland Kitchen amiodarone (PACERONE) 200 MG tablet Take 200 mg by mouth 2 (two) times daily.    Marland Kitchen apixaban (ELIQUIS) 5 MG TABS tablet Take 1 tablet (5 mg total) by mouth 2 (two) times daily. 60 tablet 0  . aspirin 81 MG chewable tablet Chew 81 mg by mouth daily.    Marland Kitchen atorvastatin (LIPITOR) 40 MG tablet Take 1 tablet (40 mg total) by mouth at bedtime. 30 tablet 11  . bisacodyl (DULCOLAX) 10 MG suppository Place 1 suppository (10 mg total) rectally daily as needed for moderate constipation. 12 suppository 0  . clopidogrel (PLAVIX) 75 MG tablet Take 75 mg by mouth daily.    . feeding supplement, ENSURE COMPLETE, (ENSURE COMPLETE) LIQD Take 237 mLs by mouth 2 (two) times daily between meals.    . ferrous gluconate (FERGON) 324 MG tablet Take 1 tablet (324 mg total) by mouth 2 (two) times daily with a meal.  3  . fluticasone (FLONASE) 50 MCG/ACT nasal spray Place 1 spray into both nostrils 2 (two) times daily.    . folic acid (FOLVITE) 1 MG tablet Take 1 tablet (1 mg total) by  mouth daily.    . furosemide (LASIX) 20 MG tablet Take 20 mg by mouth daily. For 10 days (END DATE: 06/24/14)  1  . loratadine (CLARITIN) 10 MG tablet Take 10 mg by mouth daily as needed for allergies.     . magnesium hydroxide (MILK OF MAGNESIA) 400 MG/5ML suspension Take 30 mLs by mouth daily as needed for mild constipation.    . metoprolol tartrate (LOPRESSOR) 25 MG tablet Take 0.5 tablets (12.5 mg total) by mouth 2 (two) times daily.    . nitroGLYCERIN (NITROSTAT) 0.4 MG SL tablet Place 0.4 mg under the tongue every 5 (five) minutes as needed for chest  pain (MAX 3 TABLETS).     Marland Kitchen omeprazole (PRILOSEC) 20 MG capsule Take 20 mg by mouth daily.      . phenol (CHLORASEPTIC) 1.4 % LIQD Use as directed 1 spray in the mouth or throat 3 (three) times daily as needed for throat irritation / pain.    . traMADol (ULTRAM) 50 MG tablet Take 50-100 mg by mouth every 6 (six) hours as needed for moderate pain or severe pain.     No current facility-administered medications for this visit.    Allergies:   Codeine sulfate    Social History:  The patient  reports that he has been smoking Cigarettes.  He has smoked for the past 40 years. He does not have any smokeless tobacco history on file. He reports that he does not drink alcohol or use illicit drugs.   Family History:  The patient's family history includes Cancer in his mother and sister; Coronary artery disease in his father; Heart disease in his brother and father; Hypertension in an other family member.    ROS:   Please see the history of present illness.   Review of Systems  Respiratory: Negative for hemoptysis.   Hematologic/Lymphatic: Negative for bleeding problem.  Gastrointestinal: Negative for hematochezia and melena.  Genitourinary: Negative for hematuria.  All other systems reviewed and are negative.    PHYSICAL EXAM: VS:  BP 120/70 mmHg  Pulse 112  Ht 5\' 11"  (1.803 m)  Wt 156 lb (70.761 kg)  BMI 21.77 kg/m2    Wt Readings from  Last 3 Encounters:  06/17/14 156 lb (70.761 kg)  06/05/14 161 lb 9.6 oz (73.301 kg)  05/13/14 153 lb 3.5 oz (69.5 kg)     GEN: Well nourished, well developed, in no acute distress HEENT: normal Neck: no JVD, no masses Cardiac:  Normal S1/S2, irreg irreg rhythm; no murmur, no rubs or gallops, 2+ bilat ankle edema  Chest:  Median sternotomy well healed without erythema or d/c, chest tube site stitches still intact  Respiratory:  Decreased breath sounds bilaterally, no wheezing, rhonchi or rales. No E>>A changes GI: soft, nontender, nondistended, + BS MS: no deformity or atrophy Skin: warm and dry  Neuro:  CNs II-XII intact, Strength and sensation are intact Psych: Normal affect   EKG:  EKG is ordered today.  It demonstrates:   AFlutter HR 112   Recent Labs: 05/27/2014: ALT 30 05/29/2014: Magnesium 2.6* 06/02/2014: BUN 10; Creatinine 0.82; Hemoglobin 8.1*; Platelets 154; Potassium 4.1; Sodium 132*    Lipid Panel    Component Value Date/Time   CHOL 146 11/19/2012 0958   TRIG 93.0 11/19/2012 0958   TRIG 91 04/22/2006 0854   HDL 38.50* 11/19/2012 0958   CHOLHDL 4 11/19/2012 0958   CHOLHDL 3.6 CALC 04/22/2006 0854   VLDL 18.6 11/19/2012 0958   LDLCALC 89 11/19/2012 0958      ASSESSMENT AND PLAN:  Atypical atrial flutter:  HR is uncontrolled.  He is s/w symptomatic with this.  Some of his symptoms are also related to deconditioning and HF.  He has been on uninterrupted anticoagulation for at least 12 days - since DC.  I am sure his anticoagulation was uninterrupted for longer while in the hospital.  We would rather avoid TEE if at all possible.  I have suggested increasing his Amiodarone and bring him back in close FU in 2 weeks.  If he is not back in NSR at that point, proceed with DCCV without TEE.  I discussed this with Dr.  Virl Axe (DOD) who agreed.    -  Increase Amiodarone to 400 mg Twice daily     -  FU 2 weeks.  If in AFlutter >> arrange DCCV.    -  Continue  Eliquis.  Coronary artery disease S/P CABG (coronary artery bypass graft):  Progressing ok.  He is DC tomorrow.  Once his HHPT is over, arrange OP Cardiac Rehab.  Continue beta-blocker and statin.  Continue Plavix.  Plan in the hospital was to DC ASA since he is on Eliquis.  DC ASA.  Consider ACE inhibitor at FU.   Ischemic cardiomyopathy:  Continue beta-blocker.  Consider ACE inhibitor at FU if BP and renal function stable.    Chronic systolic CHF (congestive heart failure):  He has evidence of volume excess. He feels this is better.  This may be exacerbated by AFlutter. Adjust Amiodarone for HR control and to try to get him back in NSR.  Original order is to continue Lasix x 1 week.  I will continue this at Lasix 20 mg Once daily from now on.    -  BMET and repeat 1 week.  Hyperlipidemia:  Continue statin.  Gastroesophageal reflux disease without esophagitis:  He should not be on Prilosec with Plavix.      -  DC Prilosec.    -  Start Protonix 40 mg Once daily.    Current medicines are reviewed at length with the patient today.  The patient does not have concerns regarding medicines.  The following changes have been made:  As above.  Labs/ tests ordered today include:   Orders Placed This Encounter  Procedures  . Basic Metabolic Panel (BMET)  . Basic Metabolic Panel (BMET)  . EKG 12-Lead    Disposition:   FU with me or Dr. Lauree Chandler 2 weeks.    Signed, Versie Starks, MHS 06/17/2014 11:28 AM    [facility] [place], [place], [place]  [address] Phone: [phone]; Fax: [phone]

## 2014-06-17 NOTE — Progress Notes (Signed)
Patient ID: [name], male   DOB: 08/12/1940, 74 y.o.   MRN: [id number]    This is a discharge note  Level care skilled.  [place] [place] [facility].   Chief Complaint  Patient presents with  .  discharge note     HPI: Patient is 74 y.o. male who has EXTENSIVE heart hx including recent ardiacc arrest admitted to SNF for OT/PT s/p NSTEMI X2 in February and CABG a week ago He has been relatively stable -he has had some tachycardia and cardiology has assess this and increased his amiodarone to 400 mg twice a day he continues on Lopressor as well as   Apparently he does have some history of hypotension although appears to be stable recently with blood pressures are 110/70-109/59   He is also apparently had some increased edema cardiology did start him on low dose Lasix.  Currently he does not complain of any chest pain or shortness of breath has complained of some right shoulder pain.  We did obtain an x-ray which only shows degenerative changes--this appears to be chronic he does not really complain of pain today.  He will be going home he has a very supportive wife as well as family he will need continued PT and OT for strengthening he is now ambulating quite well with a walker also nursing support for follow-up of his multiple medical issues most significantly cardiac issues.  Past Medical History  Diagnosis Date  . CAD (coronary artery disease)     CABG x 2 with LIMA to Circumflex and vein graft to PDA  07/13/1991  . History of transient ischemic attack (TIA)   . Hyperlipidemia   . Peripheral neuropathy   . PVD (peripheral vascular disease) with claudication   . Carotid bruit     bilateral  . Ventral hernia   . Personal history of prostate cancer   . Diverticulosis   . ALLERGIC RHINITIS   . Osteoarthritis   . COPD (chronic obstructive pulmonary disease)   . Tobacco use disorder, continuous   . STEMI (ST elevation myocardial infarction) 04/29/2014    PTCA L[place] and  CFX, in setting of VF/VT arrest and CGS    Past Surgical History  Procedure Laterality Date  . Coronary artery bypass graft  1992  . Lumbar laminectomy  [id number]     X2  . Prostatectomy  05/2008    Dr. Alinda Money and XRT  . Appendectomy    . Tonsillectomy    . Left heart catheterization with coronary angiogram N/A 04/30/2014    Procedure: LEFT HEART CATHETERIZATION WITH CORONARY ANGIOGRAM;  Surgeon: Peter M Martinique, MD; Lmain 90% (s/p PTCA), LAD 80%, CFX 100% (s/p PTCA), RCA OK, LIMA-LAD atretic, SVG-OM 100% (chronic), EF 45%, pt req defib x 13 for VF/VT arrest, CHB w/ tem pacer, IABP and intubation  . Coronary artery bypass graft N/A 05/28/2014    Procedure: REDO CORONARY ARTERY BYPASS GRAFTING (CABG);  Surgeon: Grace Isaac, MD;  Location: [facility];  Service: Open Heart Surgery;  Laterality: N/A;  Times 4 using right internal mammary artery to Intermediate artery and endoscopically harvested left saphenous vein to LAD, OM1, and PD coronary arteries.  Darden Dates without cardioversion N/A 05/28/2014    Procedure: TRANSESOPHAGEAL ECHOCARDIOGRAM (TEE);  Surgeon: Grace Isaac, MD;  Location: [facility];  Service: Open Heart Surgery;  Laterality: N/A;      Medication List  acetaminophen 325 MG tablet  Commonly known as:  TYLENOL  Take 975 mg by mouth 3 (three) times daily as needed (pain).     amiodarone 400 MG tablet  Commonly known as:  PACERONE  Take 400 mg by mouth 2 (two) times daily.     apixaban 5 MG Tabs tablet  Commonly known as:  ELIQUIS  Take 1 tablet (5 mg total) by mouth 2 (two) times daily.     atorvastatin 40 MG tablet  Commonly known as:  LIPITOR  Take 1 tablet (40 mg total) by mouth at bedtime.     bisacodyl 10 MG suppository  Commonly known as:  DULCOLAX  Place 1 suppository (10 mg total) rectally daily as needed for moderate constipation.     clopidogrel 75 MG tablet  Commonly known as:  PLAVIX  Take 1 tablet (75 mg total) by mouth daily.      feeding supplement (ENSURE COMPLETE) Liqd  Take 237 mLs by mouth 2 (two) times daily between meals.     ferrous gluconate 324 MG tablet  Commonly known as:  FERGON  Take 1 tablet (324 mg total) by mouth 2 (two) times daily with a meal.     fluticasone 50 MCG/ACT nasal spray  Commonly known as:  FLONASE  Place 1 spray into both nostrils 2 (two) times daily.     folic acid 1 MG tablet  Commonly known as:  FOLVITE  Take 1 tablet (1 mg total) by mouth daily.     loratadine 10 MG tablet  Commonly known as:  CLARITIN  Take 10 mg by mouth daily as needed for allergies.     magnesium hydroxide 400 MG/5ML suspension  Commonly known as:  MILK OF MAGNESIA  Take 30 mLs by mouth daily as needed for mild constipation.     metoprolol tartrate 25 MG tablet  Commonly known as:  LOPRESSOR  Take 0.5 tablets (12.5 mg total) by mouth 2 (two) times daily.     nitroGLYCERIN 0.4 MG SL tablet  Commonly known as:  NITROSTAT  Place 0.4 mg under the tongue every 5 (five) minutes as needed for chest pain.      Protonix 40 mg daily          oxyCODONE 5 MG immediate release tablet  Commonly known as:  Oxy IR/ROXICODONE  Take 1-2 tablets (5-10 mg total) by mouth every 3 (three) hours as needed for severe pain.     phenol 1.4 % Liqd  Commonly known as:  CHLORASEPTIC  Use as directed 1 spray in the mouth or throat 3 (three) times daily as needed for throat irritation / pain.     traMADol 50 MG tablet  Commonly known as:  ULTRAM  Take 1-2 tablets (50-100 mg total) by mouth every 4 (four) hours as needed for moderate pain.    Lasix 20 mg daily.                    Immunization History  Administered Date(s) Administered  . H1N1 03/16/2008  . Influenza Split 02/20/2012  . Influenza Whole 01/07/2008, 02/05/2008, 02/01/2009, 01/25/2010  . Influenza,inj,Quad PF,36+ Mos 12/24/2012, 01/11/2014  . Pneumococcal Conjugate-13 04/14/2014  . Pneumococcal Polysaccharide-23 04/23/2005  . Td  02/20/2010  . Tdap 07/08/2012    History  Substance Use Topics  . Smoking status: Current Every Day Smoker -- 40 years    Types: Cigarettes  . Smokeless tobacco: Not on file  . Alcohol Use: No  Family history is noncontributory    Review of Systems  DATA OBTAINED: from patient, nurse;   GENERAL:  no fevers, fatigue, appetite changes SKIN: No itching, rash or wounds EYES: No eye pain, redness, discharge EARS: No earache, tinnitus, change in hearing NOSE: No congestion, drainage or bleeding  MOUTH/THROAT: No mouth or tooth pain, No sore throat RESPIRATORY: No cough, wheezing, SOB CARDIAC: No chest pain, palpitations, baseline  extremity edema  GI: No abdominal pain, No N/V/D or constipation, No heartburn or reflux  GU: No dysuria, frequency or urgency, or incontinence  MUSCULOSKELETAL: complain of right shoulder discomfort at times NEUROLOGIC: No headache, dizziness or focal weakness PSYCHIATRIC: No overt anxiety or sadness, No behavior issue.                       Physical Exam Temperature 98.1 pulse 108 respirations 20 blood pressure 110/70  GENERAL APPEARANCE: Alert, conversant,  No acute distress. Sitting comfortably in his chair  SKIN: No diaphoresis rash HEAD: Normocephalic, atraumatic  EYES: Conjunctiva/lids clear. Pupils round, reactive. EOMs intact.  EARS: External exam WNL, canals clear. Hearing grossly normal.  NOSE: No deformity or discharge.  MOUTH/THROAT: Lips w/o lesions  RESPIRATORY: Breathing is even, unlabored. Lung sounds are clear   CARDIOVASCULAR: Heart rate and rhythm somewhat tachycardic no murmurs, rubs or gallops. Trace leg edema I with say one plus pedal edema .   GASTROINTESTINAL: Abdomen is soft, non-tender, not distended w/ normal bowel sounds. GENITOURINARY: Bladder non tender, not distended  MUSCULOSKELETAL: No abnormal joints or musculature--is able to move right shoulder lift his arm--appears to ambulate well with a rolling  walker NEUROLOGIC:  Cranial nerves 2-12 grossly intact. Moves all extremities  PSYCHIATRIC:  Pleasant and appropriate, no behavioral issues  Patient Active Problem List   Diagnosis Date Noted  . Acute blood loss anemia 06/09/2014  . Hypertensive heart disease 06/09/2014  . Hematuria 05/31/2014  . S/P CABG x 4 05/28/2014  . PAF- on Amiodarone 05/25/2014  . Chronic anticoagulation-on Eliquis at discharge 05/11/14 05/25/2014  . ICM-EF 45-50% by echo 04/30/14 05/25/2014  . Protein-calorie malnutrition, severe 05/25/2014  . NSTEMI (non-ST elevated myocardial infarction) 05/21/2014  . Encephalopathy 05/14/2014  . Acute respiratory acidosis   . S/P CABG x 2 05/04/2014  . Acute respiratory failure   . STEMI 05/04/14 with shock, CPR, CHB, VT 04/30/2014  . Cardiogenic shock 04/30/2014  . Cardiac arrest 04/30/2014  . VT (ventricular tachycardia) 04/30/2014  . CHB (complete heart block) 04/30/2014  . CAD S/P LM and CFX PCI 05/04/14 04/30/2014  . Wart viral 04/18/2014  . Laceration of index finger 12/04/2012  . Hand eczema 05/26/2012  . Bruising 02/20/2012  . Actinic keratoses 11/14/2011  . PAD (peripheral artery disease) 08/28/2011  . Constipation 07/18/2011  . Sinusitis acute 04/18/2011  . Ventral hernia 01/03/2011  . SWEATING 06/26/2010  . History of transient ischemic attack (TIA)   . CERUMEN IMPACTION 02/20/2010  . DIVERTICULOSIS-COLON 11/01/2009  . RECTAL BLEEDING 11/01/2009  . ABDOMINAL BLOATING 11/01/2009  . ABNORMAL BOWEL SOUNDS 11/01/2009  . ABDOMINAL PAIN-RLQ 11/01/2009  . ABDOMINAL PAIN-LLQ 11/01/2009  . ABDOMINAL PAIN, GENERALIZED 11/01/2009  . ABDOMINAL PAIN, LOWER 11/01/2009  . Rash and nonspecific skin eruption 07/11/2009  . TRANSIENT ISCHEMIC ATTACK, HX OF 03/22/2009  . PVD WITH CLAUDICATION 03/14/2009  . BRONCHITIS, ACUTE 11/26/2008  . Personal history of malignant neoplasm of prostate 08/02/2008  . TOBACCO USE DISORDER/SMOKER-SMOKING CESSATION DISCUSSED 12/08/2007   . ALLERGIC RHINITIS 08/06/2007  . ELEVATED  PROSTATE SPECIFIC ANTIGEN 08/06/2007  . Hyperlipidemia 04/22/2007  . Hereditary and idiopathic peripheral neuropathy 04/22/2007  . UPPER RESPIRATORY INFECTION (URI) 04/22/2007  . COPD exacerbation 04/22/2007  . OSTEOARTHRITIS 04/22/2007  . LOW BACK PAIN 04/22/2007  Labs.  06/11/2014.  WBC 4.9 hemoglobin 9.4 platelets 446    CBC    Component Value Date/Time   WBC 5.5 06/02/2014 0426   WBC 7.6 12/01/2008 1247   RBC 2.53* 06/02/2014 0426   RBC 3.84* 12/01/2008 1247   HGB 8.1* 06/02/2014 0426   HGB 13.2 12/01/2008 1247   HCT 23.5* 06/02/2014 0426   HCT 38.3* 12/01/2008 1247   PLT 154 06/02/2014 0426   PLT 205 12/01/2008 1247   MCV 92.9 06/02/2014 0426   MCV 99.9* 12/01/2008 1247   LYMPHSABS 1.7 04/29/2014 2130   LYMPHSABS 0.7* 12/01/2008 1247   MONOABS 0.9 04/29/2014 2130   MONOABS 0.8 12/01/2008 1247   EOSABS 0.6 04/29/2014 2130   EOSABS 0.5 12/01/2008 1247   BASOSABS 0.0 04/29/2014 2130   BASOSABS 0.0 12/01/2008 1247    CMP     Component Value Date/Time   NA 132* 06/02/2014 0426   K 4.1 06/02/2014 0426   CL 98 06/02/2014 0426   CO2 28 06/02/2014 0426   GLUCOSE 115* 06/02/2014 0426   GLUCOSE 114* 04/22/2006 0854   BUN 10 06/02/2014 0426   CREATININE 0.82 06/02/2014 0426   CALCIUM 8.3* 06/02/2014 0426   PROT 6.2 05/27/2014 0437   ALBUMIN 3.7 05/27/2014 0437   AST 24 05/27/2014 0437   ALT 30 05/27/2014 0437   ALKPHOS 56 05/27/2014 0437   BILITOT 0.6 05/27/2014 0437   GFRNONAA 84* 06/02/2014 0426   GFRAA >90 06/02/2014 0426    Assessment and Plan  NSTEMI (non-ST elevated myocardial infarction) The second one within 2 weeks.Presented from SNF with CP , atrial flutter, converted on amiodarone,  and mildly elevated troponin. Pt was loaded with plavix and started on hepatin and evaled for CABG.   S/P CABG x 4 This episode pt felt to be well enough for CABG which was performed.Posg op problems with atrial fib,  kept recurring.-Still continues with tachycardia cardiology is aware of this--and have adjusted medications including increasing his amiodarone he continues on Lopressor as well    ----   PAF- on Amiodarone Post-op afib, converted with IV then po amiodarone and low dose BBlocker. Recurrent a fib/flutter was tx with IV amiodarone again and diltiazem which ultimately caused hypotension and was d/c. Plavix was restarted then stopped due to fall risk and eliquis restarted--since then he has been restarted on Plavix. If afib continues cardioversion needs to be considered.--Cardiology is following up on this--- medication adjustments made as noted above  .   Hematuria Developed with plavix and heparin and they were d/c and hematuria resolved.--Since that Plavix has been restarted--serial CBCs have been ordered this will be followed apparently by cardiology   Acute blood loss anemia D/c Hb was 8.1; it looks like pt received 1 unit of blood. Plan ;continue folate and iron;monitor-BC shows improvement it appears cardiology has ordered serial CBGs to keep an eye on this--hemoglobin has improved on most recent lab to 9.4   Hyperlipidemia Pt on lipitor 40 mg s/p CABG; 05/2014 LFTs are normal   Hypertensive heart disease Controlled on metoprolol 12.5 milligrams ;pt has had some problem with hypotension post -op--although this appears to have stabilized  He will be going home tomorrow I'll he has a very supportive family he lives with his wife-again  will need PT and OT for strengthening as well as nursing support for his medical issues  [id number]-of note greater than 30 minutes spent on this discharge summary.

## 2014-06-17 NOTE — Patient Instructions (Signed)
LAB WORK TODAY; BMET  REPEAT BMET IN 1 WEEK  REFILLS FOR ELIQUIS, PLAVIX, LASIX, AMIODARONE, PROTONIX HAVE BEEN SENT INTO [facility]  FOLLOW UP WITH SCOTT WEAVER, PAC 2 WEEKS SAME DAY DR. Angelena Form IS IN THE OFFICE  STOP ASPRIN STOP [place]

## 2014-06-18 ENCOUNTER — Telehealth: Payer: Self-pay

## 2014-06-18 ENCOUNTER — Other Ambulatory Visit: Payer: Self-pay | Admitting: Internal Medicine

## 2014-06-18 ENCOUNTER — Ambulatory Visit (HOSPITAL_COMMUNITY)
Admission: RE | Admit: 2014-06-18 | Discharge: 2014-06-18 | Disposition: A | Discharge status: Home / Self Care | Payer: Medicare Other | Source: Ambulatory Visit | Attending: Internal Medicine | Admitting: Internal Medicine

## 2014-06-18 DIAGNOSIS — T148XXA Other injury of unspecified body region, initial encounter: Secondary | ICD-10-CM

## 2014-06-18 DIAGNOSIS — S0093XA Contusion of unspecified part of head, initial encounter: Secondary | ICD-10-CM | POA: Diagnosis not present

## 2014-06-18 DIAGNOSIS — S0990XA Unspecified injury of head, initial encounter: Secondary | ICD-10-CM | POA: Diagnosis not present

## 2014-06-18 DIAGNOSIS — W19XXXA Unspecified fall, initial encounter: Secondary | ICD-10-CM | POA: Insufficient documentation

## 2014-06-18 MED ORDER — FUROSEMIDE 20 MG PO TABS: 20.0000 mg | ORAL_TABLET | Freq: Every day | ORAL | Status: DC

## 2014-06-18 NOTE — Addendum Note (Signed)
Addended by: Michae Kava on: 06/18/2014 01:53 PM   Modules accepted: Orders

## 2014-06-18 NOTE — Telephone Encounter (Signed)
Patient calling to get lab results. Per Richardson Dopp PA, potassium and creatinine is okay. Patient verbalized understanding.

## 2014-06-19 DIAGNOSIS — M6281 Muscle weakness (generalized): Secondary | ICD-10-CM | POA: Diagnosis not present

## 2014-06-19 DIAGNOSIS — M4806 Spinal stenosis, lumbar region: Secondary | ICD-10-CM | POA: Diagnosis not present

## 2014-06-19 DIAGNOSIS — I2510 Atherosclerotic heart disease of native coronary artery without angina pectoris: Secondary | ICD-10-CM | POA: Diagnosis not present

## 2014-06-19 DIAGNOSIS — Z48812 Encounter for surgical aftercare following surgery on the circulatory system: Secondary | ICD-10-CM | POA: Diagnosis not present

## 2014-06-19 DIAGNOSIS — Z720 Tobacco use: Secondary | ICD-10-CM | POA: Diagnosis not present

## 2014-06-19 DIAGNOSIS — I4891 Unspecified atrial fibrillation: Secondary | ICD-10-CM | POA: Diagnosis not present

## 2014-06-19 DIAGNOSIS — M1990 Unspecified osteoarthritis, unspecified site: Secondary | ICD-10-CM | POA: Diagnosis not present

## 2014-06-19 DIAGNOSIS — E785 Hyperlipidemia, unspecified: Secondary | ICD-10-CM | POA: Diagnosis not present

## 2014-06-19 DIAGNOSIS — Z8673 Personal history of transient ischemic attack (TIA), and cerebral infarction without residual deficits: Secondary | ICD-10-CM | POA: Diagnosis not present

## 2014-06-19 DIAGNOSIS — Z9181 History of falling: Secondary | ICD-10-CM | POA: Diagnosis not present

## 2014-06-19 DIAGNOSIS — I213 ST elevation (STEMI) myocardial infarction of unspecified site: Secondary | ICD-10-CM | POA: Diagnosis not present

## 2014-06-21 DIAGNOSIS — I2510 Atherosclerotic heart disease of native coronary artery without angina pectoris: Secondary | ICD-10-CM | POA: Diagnosis not present

## 2014-06-21 DIAGNOSIS — I4891 Unspecified atrial fibrillation: Secondary | ICD-10-CM | POA: Diagnosis not present

## 2014-06-21 DIAGNOSIS — Z48812 Encounter for surgical aftercare following surgery on the circulatory system: Secondary | ICD-10-CM | POA: Diagnosis not present

## 2014-06-21 DIAGNOSIS — Z9181 History of falling: Secondary | ICD-10-CM | POA: Diagnosis not present

## 2014-06-21 DIAGNOSIS — Z8673 Personal history of transient ischemic attack (TIA), and cerebral infarction without residual deficits: Secondary | ICD-10-CM | POA: Diagnosis not present

## 2014-06-21 DIAGNOSIS — I213 ST elevation (STEMI) myocardial infarction of unspecified site: Secondary | ICD-10-CM | POA: Diagnosis not present

## 2014-06-22 ENCOUNTER — Telehealth: Payer: Self-pay | Admitting: Cardiovascular Disease

## 2014-06-22 ENCOUNTER — Emergency Department (HOSPITAL_COMMUNITY): Payer: Medicare Other

## 2014-06-22 ENCOUNTER — Encounter (HOSPITAL_COMMUNITY): Payer: Self-pay | Admitting: *Deleted

## 2014-06-22 ENCOUNTER — Observation Stay (HOSPITAL_COMMUNITY)
Admission: EM | Admit: 2014-06-22 | Discharge: 2014-06-25 | Disposition: A | Discharge status: Home / Self Care | Payer: Medicare Other | Attending: Interventional Cardiology | Admitting: Interventional Cardiology

## 2014-06-22 DIAGNOSIS — R112 Nausea with vomiting, unspecified: Secondary | ICD-10-CM | POA: Insufficient documentation

## 2014-06-22 DIAGNOSIS — Z951 Presence of aortocoronary bypass graft: Secondary | ICD-10-CM | POA: Insufficient documentation

## 2014-06-22 DIAGNOSIS — I4891 Unspecified atrial fibrillation: Secondary | ICD-10-CM | POA: Diagnosis not present

## 2014-06-22 DIAGNOSIS — K5790 Diverticulosis of intestine, part unspecified, without perforation or abscess without bleeding: Secondary | ICD-10-CM | POA: Insufficient documentation

## 2014-06-22 DIAGNOSIS — I252 Old myocardial infarction: Secondary | ICD-10-CM | POA: Insufficient documentation

## 2014-06-22 DIAGNOSIS — Z48812 Encounter for surgical aftercare following surgery on the circulatory system: Secondary | ICD-10-CM | POA: Diagnosis not present

## 2014-06-22 DIAGNOSIS — I2510 Atherosclerotic heart disease of native coronary artery without angina pectoris: Secondary | ICD-10-CM | POA: Diagnosis not present

## 2014-06-22 DIAGNOSIS — R0989 Other specified symptoms and signs involving the circulatory and respiratory systems: Secondary | ICD-10-CM | POA: Diagnosis not present

## 2014-06-22 DIAGNOSIS — I951 Orthostatic hypotension: Secondary | ICD-10-CM | POA: Insufficient documentation

## 2014-06-22 DIAGNOSIS — I739 Peripheral vascular disease, unspecified: Secondary | ICD-10-CM | POA: Insufficient documentation

## 2014-06-22 DIAGNOSIS — Z9079 Acquired absence of other genital organ(s): Secondary | ICD-10-CM | POA: Diagnosis not present

## 2014-06-22 DIAGNOSIS — I5022 Chronic systolic (congestive) heart failure: Secondary | ICD-10-CM | POA: Insufficient documentation

## 2014-06-22 DIAGNOSIS — Z8546 Personal history of malignant neoplasm of prostate: Secondary | ICD-10-CM | POA: Diagnosis not present

## 2014-06-22 DIAGNOSIS — M1990 Unspecified osteoarthritis, unspecified site: Secondary | ICD-10-CM | POA: Insufficient documentation

## 2014-06-22 DIAGNOSIS — Z8673 Personal history of transient ischemic attack (TIA), and cerebral infarction without residual deficits: Secondary | ICD-10-CM | POA: Insufficient documentation

## 2014-06-22 DIAGNOSIS — J9811 Atelectasis: Secondary | ICD-10-CM | POA: Diagnosis not present

## 2014-06-22 DIAGNOSIS — F17210 Nicotine dependence, cigarettes, uncomplicated: Secondary | ICD-10-CM | POA: Diagnosis not present

## 2014-06-22 DIAGNOSIS — R0609 Other forms of dyspnea: Secondary | ICD-10-CM

## 2014-06-22 DIAGNOSIS — E785 Hyperlipidemia, unspecified: Secondary | ICD-10-CM | POA: Diagnosis not present

## 2014-06-22 DIAGNOSIS — Z95820 Peripheral vascular angioplasty status with implants and grafts: Secondary | ICD-10-CM | POA: Insufficient documentation

## 2014-06-22 DIAGNOSIS — J90 Pleural effusion, not elsewhere classified: Secondary | ICD-10-CM | POA: Diagnosis not present

## 2014-06-22 DIAGNOSIS — G629 Polyneuropathy, unspecified: Secondary | ICD-10-CM | POA: Diagnosis not present

## 2014-06-22 DIAGNOSIS — Z7901 Long term (current) use of anticoagulants: Secondary | ICD-10-CM | POA: Diagnosis not present

## 2014-06-22 DIAGNOSIS — E876 Hypokalemia: Secondary | ICD-10-CM | POA: Insufficient documentation

## 2014-06-22 DIAGNOSIS — E860 Dehydration: Secondary | ICD-10-CM

## 2014-06-22 DIAGNOSIS — I4892 Unspecified atrial flutter: Principal | ICD-10-CM | POA: Diagnosis present

## 2014-06-22 DIAGNOSIS — Z9181 History of falling: Secondary | ICD-10-CM | POA: Diagnosis not present

## 2014-06-22 DIAGNOSIS — E871 Hypo-osmolality and hyponatremia: Secondary | ICD-10-CM | POA: Insufficient documentation

## 2014-06-22 DIAGNOSIS — T462X5A Adverse effect of other antidysrhythmic drugs, initial encounter: Secondary | ICD-10-CM | POA: Insufficient documentation

## 2014-06-22 DIAGNOSIS — R0602 Shortness of breath: Secondary | ICD-10-CM | POA: Diagnosis not present

## 2014-06-22 DIAGNOSIS — J309 Allergic rhinitis, unspecified: Secondary | ICD-10-CM | POA: Diagnosis not present

## 2014-06-22 DIAGNOSIS — J449 Chronic obstructive pulmonary disease, unspecified: Secondary | ICD-10-CM | POA: Diagnosis not present

## 2014-06-22 DIAGNOSIS — I255 Ischemic cardiomyopathy: Secondary | ICD-10-CM | POA: Insufficient documentation

## 2014-06-22 DIAGNOSIS — I213 ST elevation (STEMI) myocardial infarction of unspecified site: Secondary | ICD-10-CM | POA: Diagnosis not present

## 2014-06-22 DIAGNOSIS — F17209 Nicotine dependence, unspecified, with unspecified nicotine-induced disorders: Secondary | ICD-10-CM | POA: Diagnosis present

## 2014-06-22 LAB — CBC
HCT: 33.5 % — ABNORMAL LOW (ref 39.0–52.0)
HEMOGLOBIN: 11.2 g/dL — AB (ref 13.0–17.0)
MCH: 31.2 pg (ref 26.0–34.0)
MCHC: 33.4 g/dL (ref 30.0–36.0)
MCV: 93.3 fL (ref 78.0–100.0)
Platelets: 357 10*3/uL (ref 150–400)
RBC: 3.59 MIL/uL — AB (ref 4.22–5.81)
RDW: 15.8 % — ABNORMAL HIGH (ref 11.5–15.5)
WBC: 6.6 10*3/uL (ref 4.0–10.5)

## 2014-06-22 LAB — BASIC METABOLIC PANEL
Anion gap: 11 (ref 5–15)
BUN: 11 mg/dL (ref 6–23)
CALCIUM: 9.1 mg/dL (ref 8.4–10.5)
CHLORIDE: 93 mmol/L — AB (ref 96–112)
CO2: 25 mmol/L (ref 19–32)
CREATININE: 0.96 mg/dL (ref 0.50–1.35)
GFR calc non Af Amer: 79 mL/min — ABNORMAL LOW (ref >90)
Glucose, Bld: 123 mg/dL — ABNORMAL HIGH (ref 70–99)
Potassium: 3.5 mmol/L (ref 3.5–5.1)
Sodium: 129 mmol/L — ABNORMAL LOW (ref 135–145)

## 2014-06-22 LAB — URINALYSIS, ROUTINE W REFLEX MICROSCOPIC
Bilirubin Urine: NEGATIVE
GLUCOSE, UA: NEGATIVE mg/dL
Hgb urine dipstick: NEGATIVE
KETONES UR: NEGATIVE mg/dL
Leukocytes, UA: NEGATIVE
NITRITE: NEGATIVE
PH: 7 (ref 5.0–8.0)
PROTEIN: NEGATIVE mg/dL
Specific Gravity, Urine: 1.009 (ref 1.005–1.030)
Urobilinogen, UA: 1 mg/dL (ref 0.0–1.0)

## 2014-06-22 LAB — I-STAT TROPONIN, ED
TROPONIN I, POC: 0.04 ng/mL (ref 0.00–0.08)
Troponin i, poc: 0.02 ng/mL (ref 0.00–0.08)

## 2014-06-22 LAB — HEPATIC FUNCTION PANEL
ALBUMIN: 3.4 g/dL — AB (ref 3.5–5.2)
ALK PHOS: 82 U/L (ref 39–117)
ALT: 30 U/L (ref 0–53)
AST: 21 U/L (ref 0–37)
BILIRUBIN INDIRECT: 0.5 mg/dL (ref 0.3–0.9)
Bilirubin, Direct: 0.1 mg/dL (ref 0.0–0.5)
TOTAL PROTEIN: 5.4 g/dL — AB (ref 6.0–8.3)
Total Bilirubin: 0.6 mg/dL (ref 0.3–1.2)

## 2014-06-22 LAB — LIPASE, BLOOD: Lipase: 33 U/L (ref 11–59)

## 2014-06-22 MED ORDER — CLOPIDOGREL BISULFATE 75 MG PO TABS
75.0000 mg | ORAL_TABLET | Freq: Every day | ORAL | Status: DC
  Administered 2014-06-23 – 2014-06-25 (×3): 75 mg via ORAL
  Filled 2014-06-22 (×3): qty 1

## 2014-06-22 MED ORDER — APIXABAN 5 MG PO TABS
5.0000 mg | ORAL_TABLET | Freq: Two times a day (BID) | ORAL | Status: DC
  Administered 2014-06-23 – 2014-06-25 (×6): 5 mg via ORAL
  Filled 2014-06-22 (×7): qty 1

## 2014-06-22 MED ORDER — ENSURE COMPLETE PO LIQD
237.0000 mL | Freq: Two times a day (BID) | ORAL | Status: DC
  Administered 2014-06-23 – 2014-06-25 (×3): 237 mL via ORAL

## 2014-06-22 MED ORDER — SODIUM CHLORIDE 0.9 % IV SOLN
INTRAVENOUS | Status: AC
  Administered 2014-06-23: 1000 mL via INTRAVENOUS

## 2014-06-22 MED ORDER — ONDANSETRON HCL 4 MG/2ML IJ SOLN
4.0000 mg | Freq: Once | INTRAMUSCULAR | Status: DC
Start: 2014-06-22 — End: 2014-06-23
  Filled 2014-06-22: qty 2

## 2014-06-22 MED ORDER — SODIUM CHLORIDE 0.9 % IV BOLUS (SEPSIS)
500.0000 mL | Freq: Once | INTRAVENOUS | Status: AC
  Administered 2014-06-22: 500 mL via INTRAVENOUS

## 2014-06-22 MED ORDER — ATORVASTATIN CALCIUM 40 MG PO TABS
40.0000 mg | ORAL_TABLET | Freq: Every day | ORAL | Status: DC
  Administered 2014-06-23 – 2014-06-24 (×3): 40 mg via ORAL
  Filled 2014-06-22 (×4): qty 1

## 2014-06-22 MED ORDER — METOPROLOL TARTRATE 25 MG PO TABS: 25.0000 mg | ORAL_TABLET | Freq: Two times a day (BID) | ORAL | Status: DC

## 2014-06-22 MED ORDER — ACETAMINOPHEN 325 MG PO TABS
650.0000 mg | ORAL_TABLET | Freq: Once | ORAL | Status: AC
  Administered 2014-06-22: 650 mg via ORAL
  Filled 2014-06-22: qty 2

## 2014-06-22 MED ORDER — METOPROLOL TARTRATE 25 MG PO TABS
25.0000 mg | ORAL_TABLET | Freq: Two times a day (BID) | ORAL | Status: DC
  Administered 2014-06-23 – 2014-06-24 (×4): 25 mg via ORAL
  Filled 2014-06-22 (×5): qty 1

## 2014-06-22 MED ORDER — AMIODARONE HCL 200 MG PO TABS
400.0000 mg | ORAL_TABLET | Freq: Two times a day (BID) | ORAL | Status: DC
  Administered 2014-06-23 – 2014-06-25 (×6): 400 mg via ORAL
  Filled 2014-06-22 (×7): qty 2

## 2014-06-22 MED ORDER — PANTOPRAZOLE SODIUM 40 MG PO TBEC
40.0000 mg | DELAYED_RELEASE_TABLET | Freq: Every day | ORAL | Status: DC
  Administered 2014-06-23 – 2014-06-25 (×3): 40 mg via ORAL
  Filled 2014-06-22 (×3): qty 1

## 2014-06-22 MED ORDER — TRAMADOL HCL 50 MG PO TABS
50.0000 mg | ORAL_TABLET | Freq: Four times a day (QID) | ORAL | Status: DC | PRN
  Administered 2014-06-23 (×2): 50 mg via ORAL
  Administered 2014-06-23: 100 mg via ORAL
  Administered 2014-06-24: 50 mg via ORAL
  Administered 2014-06-24: 100 mg via ORAL
  Filled 2014-06-22 (×2): qty 1
  Filled 2014-06-22 (×2): qty 2
  Filled 2014-06-22: qty 1

## 2014-06-22 NOTE — ED Notes (Signed)
Pt recently had heart surgery. Pt reports vomiting and left arm pain that started at 3pm today. Pt states that he has had increasing generalized weakness since discharge.

## 2014-06-22 NOTE — Telephone Encounter (Signed)
Reviewed with Richardson Dopp, PA.  Need to check on weight gain or edema.  If pt is sleeping less than at rehab he can try Benadryl or melatonin at bedtime.  Increase metoprolol to 25 mg by mouth twice daily. If he does not start feeling better will need office visit.  Nicki Reaper can also see pt in office today if pt would like to come in.  I spoke with pt and offered him appt today or increasing medication.   He is unable to come in today due to transportation issues.  He would like appt later this week.  Appt made for pt to see Richardson Dopp, PA on June 24, 2014 at 2:40.  He reports he has no weight gain. Still with some swelling in left ankle but this is less than before.  He reports he only slept a couple hours last night.  He states he has taken Lorazepam in past at bedtime.  I told him he could try Benadryl 25 mg or melatonin at bedtime.  He is aware to follow label instructions for melatonin.  He will increase lopressor to 25 mg by mouth twice daily.  I told pt if he feels better he can call to cancel appt for Thursday.  Will send prescription for increased dose of lopressor to [facility] on [address].

## 2014-06-22 NOTE — H&P (Signed)
Cardiologist:  [name] is an 74 y.o. male.   Chief Complaint: Nausea, vomiting, dizziness HPI:  [name] is a 74 y.o. male with a hx of CAD, s/p CABG in 1993, HL, PAD s/p stent to L SFA 5/11 and R SFA PTA 6/11. In January of this year, the patient was admitted to [facility] with a NSTEMI, atrial fibrillation with RVR. At that time, catheterization was performed and showed a critical left main, LAD, and CX lesions with a left dominant system. S-OM was occluded and L-LAD was atretic. He developed cardiogenic shock requiring balloon pump and IV pressor support. He also suffered recurrent V- Fib, with CPR and multiple defibrillations performed. He required ventilator support for respiratory failure. He underwent partially successful PTCA of the distal left main and ostial circumflex. During that hospitalization, a TCTS consult was obtained for consideration of redo CABG, but patient was not felt to be a candidate at that time due to deconditioning. He was discharged to a nursing home on 05/14/2014. He initially did okay, however, he presented back to [facility] with recurrent chest pain. Admitted 2/11-2/27. He was again found to have a mildly elevated troponin and to be in atrial flutter. He converted to NSR with Amiodarone and was seen by Dr. Servando [name]. Decision was made to pursue re-do CABG (RIMA-Intermediate, S-OM, S-PDA, S-dist LAD). Postop course was notable for recurrent AFib/Flutter and was continued on Amiodarone. He was placed on Eliquis for stroke prophylaxis. He was DC to SNF for rehab. He was recently placed on Lasix for LE edema while at rehabilitation. He was seen for follow up on 3/10 with [name], [facility] who increased his Amio to 400 BID, because of uncontrolled HR while in flutter, and switched the lasix to 38m daily. The patient reports on Friday prior to being discharged from rehabilitation he had turned around  and fell down.  He hit his head and was seen at [facility].  There is no acute injury seen on head CT.  Since his discharge however, he's been getting progressively weaker. He noted dizziness that is worse upon changing position. In this afternoon approximately 2:30 was having nausea and vomiting.  Patient reports he drinks a lot of caffeinated coffee. Yesterday he had 7 cups. He's also been drinking a fair amount of water probably 60 ounces a day.   Patient's son is also reported that the patient has had problems with memory loss within the last year.  Past Medical History  Diagnosis Date  . CAD (coronary artery disease)     CABG x 2 with LIMA to Circumflex and vein graft to PDA  07/13/1991  . History of transient ischemic attack (TIA)   . Hyperlipidemia   . Peripheral neuropathy   . PVD (peripheral vascular disease) with claudication   . Carotid bruit     bilateral  . Ventral hernia   . Personal history of prostate cancer   . Diverticulosis   . ALLERGIC RHINITIS   . Osteoarthritis   . COPD (chronic obstructive pulmonary disease)   . Tobacco use disorder, continuous   . STEMI (ST elevation myocardial infarction) 04/29/2014    PTCA Lmain and CFX, in setting of VF/VT arrest and CGS    Past Surgical History  Procedure Laterality Date  . Coronary artery bypass graft  1992  . Lumbar laminectomy  [id number]    X2  . Prostatectomy  05/2008    Dr. BAlinda Moneyand XRT  .  Appendectomy    . Tonsillectomy    . Left heart catheterization with coronary angiogram N/A 04/30/2014    Procedure: LEFT HEART CATHETERIZATION WITH CORONARY ANGIOGRAM;  Surgeon: Nathan M Martinique, MD; Lmain 90% (s/p PTCA), LAD 80%, CFX 100% (s/p PTCA), RCA OK, LIMA-LAD atretic, SVG-OM 100% (chronic), EF 45%, pt req defib x 13 for VF/VT arrest, CHB w/ tem pacer, IABP and intubation  . Coronary artery bypass graft N/A 05/28/2014    Procedure: REDO CORONARY ARTERY BYPASS GRAFTING (CABG);  Surgeon: Nathan Isaac, MD;  Location: [place];  Service: Open Heart Surgery;  Laterality: N/A;  Times 4 using right internal mammary artery to Intermediate artery and endoscopically harvested left saphenous vein to LAD, OM1, and PD coronary arteries.  Darden Dates without cardioversion N/A 05/28/2014    Procedure: TRANSESOPHAGEAL ECHOCARDIOGRAM (TEE);  Surgeon: Nathan Isaac, MD;  Location: [place];  Service: Open Heart Surgery;  Laterality: N/A;    Family History  Problem Relation Age of Onset  . Hypertension    . Cancer Mother     Colon  . Heart disease Father   . Coronary artery disease Father   . Cancer Sister   . Heart disease Brother    Social History:  reports that he has been smoking Cigarettes.  He has smoked for the past 40 years. He does not have any smokeless tobacco history on file. He reports that he does not drink alcohol or use illicit drugs.  Allergies:  Allergies  Allergen Reactions  . Adhesive [Tape] Other (See Comments)    Takes skin off   . Codeine Sulfate Itching and Other (See Comments)    headaches     (Not in a hospital admission)  Results for orders placed or performed during the hospital encounter of 06/22/14 (from the past 48 hour(s))  CBC     Status: Abnormal   Collection Time: 06/22/14  5:31 PM  Result Value Ref Range   WBC 6.6 4.0 - 10.5 K/uL   RBC 3.59 (L) 4.22 - 5.81 MIL/uL   Hemoglobin 11.2 (L) 13.0 - 17.0 g/dL   HCT 33.5 (L) 39.0 - 52.0 %   MCV 93.3 78.0 - 100.0 fL   MCH 31.2 26.0 - 34.0 pg   MCHC 33.4 30.0 - 36.0 g/dL   RDW 15.8 (H) 11.5 - 15.5 %   Platelets 357 150 - 400 K/uL  Basic metabolic panel     Status: Abnormal   Collection Time: 06/22/14  5:31 PM  Result Value Ref Range   Sodium 129 (L) 135 - 145 mmol/L   Potassium 3.5 3.5 - 5.1 mmol/L   Chloride 93 (L) 96 - 112 mmol/L   CO2 25 19 - 32 mmol/L   Glucose, Bld 123 (H) 70 - 99 mg/dL   BUN 11 6 - 23 mg/dL   Creatinine, Ser 0.96 0.50 - 1.35 mg/dL   Calcium 9.1 8.4 - 10.5 mg/dL   GFR calc non Af Amer 79 (L) >90 mL/min    GFR calc Af Amer >90 >90 mL/min    Comment: (NOTE) The eGFR has been calculated using the CKD EPI equation. This calculation has not been validated in all clinical situations. eGFR's persistently <90 mL/min signify possible Chronic Kidney Disease.    Anion gap 11 5 - 15  I-stat troponin, ED (not at [facility])     Status: None   Collection Time: 06/22/14  6:18 PM  Result Value Ref Range   Troponin i, poc 0.04 0.00 -  0.08 ng/mL   Comment 3            Comment: Due to the release kinetics of cTnI, a negative result within the first hours of the onset of symptoms does not rule out myocardial infarction with certainty. If myocardial infarction is still suspected, repeat the test at appropriate intervals.   I-stat troponin, ED     Status: None   Collection Time: 06/22/14  8:03 PM  Result Value Ref Range   Troponin i, poc 0.02 0.00 - 0.08 ng/mL   Comment 3            Comment: Due to the release kinetics of cTnI, a negative result within the first hours of the onset of symptoms does not rule out myocardial infarction with certainty. If myocardial infarction is still suspected, repeat the test at appropriate intervals.    Dg Chest 2 View  06/22/2014   CLINICAL DATA:  Vomiting and left arm pain since 1500 hours today. CABG performed 05/28/2014.  EXAM: CHEST  2 VIEW  COMPARISON:  05/31/2014  FINDINGS: Status post median sternotomy and CABG. Heart is normal in size. Aeration at the lung bases has improved. There is minimal persistent left lower lobe atelectasis or scarring. Persistent small right-sided pleural effusion. There has been some improvement in the small left pleural effusion. No pneumothorax or pulmonary edema.  No free intraperitoneal air.  IMPRESSION: 1. Persistent minimal left lower lobe atelectasis. 2. Interval improvement and bibasilar aeration. 3. Persistent small right-sided pleural effusion.   Electronically Signed   By: Nolon Nations M.D.   On: 06/22/2014 18:46    Review of  Systems  Constitutional: Negative for fever and diaphoresis.  HENT: Positive for sore throat. Negative for congestion.   Respiratory: Negative for cough and shortness of breath.   Cardiovascular: Positive for leg swelling (in his feet). Negative for chest pain, orthopnea and PND.  Gastrointestinal: Positive for nausea and vomiting. Negative for abdominal pain, blood in stool and melena.  Musculoskeletal: Negative for myalgias.  Neurological: Positive for dizziness and weakness.  All other systems reviewed and are negative.   Blood pressure 140/79, pulse 105, temperature 97.8 F (36.6 C), temperature source Oral, resp. rate 20, SpO2 99 %. Physical Exam  Nursing note and vitals reviewed. Constitutional: He is oriented to person, place, and time. He appears well-developed and well-nourished. No distress.  HENT:  Head: Normocephalic and atraumatic.  Mouth/Throat: No oropharyngeal exudate.  Eyes: EOM are normal. Pupils are equal, round, and reactive to light. No scleral icterus.  Neck: Normal range of motion. Neck supple. No JVD present.  Cardiovascular: Regular rhythm, S1 normal and S2 normal.  Tachycardia present.   No murmur heard. Pulses:      Radial pulses are 2+ on the right side, and 2+ on the left side.       Dorsalis pedis pulses are 2+ on the right side, and 2+ on the left side.  Respiratory: Effort normal. He has no wheezes. He has no rales.  GI: Soft. Bowel sounds are normal. There is tenderness (epigastric area).  Musculoskeletal: He exhibits edema (1+ ankle edema).  Lymphadenopathy:    He has no cervical adenopathy.  Neurological: He is alert and oriented to person, place, and time. He exhibits normal muscle tone.  Skin: Skin is warm and dry.  Psychiatric: He has a normal mood and affect.     Assessment/Plan Principal Problem:   Dehydration Active Problems:   Orthostatic hypotension   Atrial flutter with rapid  ventricular response   Hyperlipidemia   Chronic  anticoagulation-on Eliquis at discharge 06/04/14   ICM-EF 45-50% by echo 04/30/14   S/P CABG x 4   Tobacco abuse   Memory loss   The patient will be admitted for observation.  We will start him on normal saline at 70 mL per hour for 1 L. he already received 500 mL bolus in the emergency room. Continue low-sodium diet. We'll recheck orthostatic vital signs in the morning. Strict ins and outs Hold Lasix.  He continues in atrial flutter his heart rate is just above 100 currently. According to his son, it was in the 110s to 18s a couple days ago. There has been some improvement. We'll continue Eliquis, which he was on when he was discharged with on February 26. Continue amiodarone. If he feels better in the morning after hydration, we could discharge and follow-up with cardioversion in a week or 2.  We discussed not drinking caffeinated coffee.  He is also smoking again.     Tarri Fuller, PA-C 06/22/2014, 8:34 PM  The patient was seen, examined and discussed with Tarri Fuller, PA-C and I agree with the above.   74 year old male with known CAD, s/p NSTEMI and CABG on 05/28/2014, just discharged from inpatient rehab on 06/18/2014. He was recently started on Lasix 40, then decreased to 20 mg po daily for LE edema, he is drinking excessive amount of coffee and minimal amount of fluids. The patient presented with weakness, dizziness, nausea.  He was found to have low K, Na and and orthostatic hypotension and in atrial flutter with RVR. The patient was in atrial flutter on discharge in February, he was started on amiodarone and Eliquis with plan for DCCV in 4 weeks.  He also fell the last week and had a head CT done in [facility] ER, that shoed no hematoma and no intracranial bleed.    Plan: - careful hydration and holding lasix, limit coffee amount (7 cups yesterday) - re-assess in the am, if no more orthostasis and resolution of tachycardia he can go home and return for an outpatient DCCV in 2 weeks - otherwise  consider TEE/DCCV as an inpatient procedure.  - continue amiodarone and eliquis  [name] 06/22/2014

## 2014-06-22 NOTE — Telephone Encounter (Signed)
New problem   Pt stated since he has came from rehab he has been getting weak. Pt need to speak to nurse.

## 2014-06-22 NOTE — Telephone Encounter (Signed)
Spoke with pt. He reports he was released from rehab on 06/18/14. At the time he was feeling good.  Starting on 06/19/14 he started feeling weak and tired.  Not sleeping well.  Wakes up tired and feels tired throughout the day.  Occasional shortness of breath but pt states this is not worse than it has been.  Therapists have been seeing him every other day.  He reports blood pressure has been 120/60-70, heart rate 113-120 and oxygen sat 98% when checked by therapist.  Pt aware of elevated heart rate.  Dizzy at times.  Will review with provider in office.

## 2014-06-22 NOTE — Telephone Encounter (Signed)
Spoke with home health nurse. She reports she saw pt this afternoon and he reported feeling awful and like a "wet dish rag".  Heart rate ranged 108-120.  Blood pressure 90/60.  Pt vomited one time while nurse was there.  He felt a little better when nurse left.  I told home health nurse I would call pt.  I called and spoke with pt.  He has not vomited again but is still weak.  I told him with symptoms he is having he should be evaluated in ED at [facility].  He will have son transport him.  He is aware to call EMS if symptoms worsen.

## 2014-06-22 NOTE — Telephone Encounter (Signed)
New Msg         Nurse request call back, pt HR is fast   BP low 100 & 44'B for systolic.  Pt has been having nausea & lethargic.  Please call.

## 2014-06-22 NOTE — ED Provider Notes (Signed)
CSN: [id number]     Arrival date & time 06/22/14  1658 History   First MD Initiated Contact with Patient 06/22/14 1841     Chief Complaint  Patient presents with  . Emesis     (Consider location/radiation/quality/duration/timing/severity/associated sxs/prior Treatment) HPI   PMH [name] is a 74 y.o. male with a hx of CAD, s/p CABG in 1993, HL, PAD s/p stent to L SFA 5/11 and R SFA PTA6/11. In January of this year, the patient was admitted to [facility] with a NSTEMI, atrial fibrillation with RVR,  He developed cardiogenic shock requiring balloon pump and IV pressor support. He also suffered recurrent V- Fib, with CPR and multiple defibrillations performed. He required ventilator support for respiratory failure. He underwent partially successful PTCA of the distal left main and ostial circumflex. Admitted 2/11-2/27. He was again found to have a mildly elevated troponin and to be in atrial flutter. He converted to NSR with Amiodarone and was seen by Dr. Servando Snare. Decision was made to pursue re-do CABG.  He was placed on Eliquis for stroke prophylaxis. He was DC to SNF for rehab.  Pt discharged from SNF this past Friday back to home with his wife (06/18/14). He reports having weakness with dyspnea on exertion that is continuing to worsen. This morning he had a few episodes of vomiting with some left arm pain and diaphoresis. The patient's symptoms have worsened so much that he has severe weakness and shortness of breath walking from the bedroom to the bathroom which is worse since this past Friday. Pt is accompanied by his wife and son.  Past Medical History  Diagnosis Date  . CAD (coronary artery disease)     CABG x 2 with LIMA to Circumflex and vein graft to PDA  07/13/1991  . History of transient ischemic attack (TIA)   . Hyperlipidemia   . Peripheral neuropathy   . PVD (peripheral vascular disease) with claudication   . Carotid bruit     bilateral  . Ventral  hernia   . Personal history of prostate cancer   . Diverticulosis   . ALLERGIC RHINITIS   . Osteoarthritis   . COPD (chronic obstructive pulmonary disease)   . Tobacco use disorder, continuous   . STEMI (ST elevation myocardial infarction) 04/29/2014    PTCA Lmain and CFX, in setting of VF/VT arrest and CGS   Past Surgical History  Procedure Laterality Date  . Coronary artery bypass graft  1992  . Lumbar laminectomy  [id number]     X2  . Prostatectomy  05/2008    Dr. Alinda Money and XRT  . Appendectomy    . Tonsillectomy    . Left heart catheterization with coronary angiogram N/A 04/30/2014    Procedure: LEFT HEART CATHETERIZATION WITH CORONARY ANGIOGRAM;  Surgeon: Peter M Martinique, MD; Lmain 90% (s/p PTCA), LAD 80%, CFX 100% (s/p PTCA), RCA OK, LIMA-LAD atretic, SVG-OM 100% (chronic), EF 45%, pt req defib x 13 for VF/VT arrest, CHB w/ tem pacer, IABP and intubation  . Coronary artery bypass graft N/A 05/28/2014    Procedure: REDO CORONARY ARTERY BYPASS GRAFTING (CABG);  Surgeon: Grace Isaac, MD;  Location: [facility];  Service: Open Heart Surgery;  Laterality: N/A;  Times 4 using right internal mammary artery to Intermediate artery and endoscopically harvested left saphenous vein to LAD, OM1, and PD coronary arteries.  Darden Dates without cardioversion N/A 05/28/2014    Procedure: TRANSESOPHAGEAL ECHOCARDIOGRAM (TEE);  Surgeon: Grace Isaac, MD;  Location:  [facility];  Service: Open Heart Surgery;  Laterality: N/A;   Family History  Problem Relation Age of Onset  . Hypertension    . Cancer Mother     Colon  . Heart disease Father   . Coronary artery disease Father   . Cancer Sister   . Heart disease Brother    History  Substance Use Topics  . Smoking status: Current Every Day Smoker -- 40 years    Types: Cigarettes  . Smokeless tobacco: Not on file  . Alcohol Use: No    Review of Systems  10 Systems reviewed and are negative for acute change except as noted in the HPI.    Allergies    Adhesive and Codeine sulfate  Home Medications   Prior to Admission medications   Medication Sig Start Date End Date Taking? Authorizing Provider  amiodarone (PACERONE) 400 MG tablet Take 1 tablet (400 mg total) by mouth 2 (two) times daily. 06/17/14  Yes Liliane Shi, PA-C  apixaban (ELIQUIS) 5 MG TABS tablet Take 1 tablet (5 mg total) by mouth 2 (two) times daily. 06/17/14  Yes Scott T Kathlen Mody, PA-C  atorvastatin (LIPITOR) 40 MG tablet Take 1 tablet (40 mg total) by mouth at bedtime. 06/04/14  Yes Coolidge Breeze, PA-C  bisacodyl (DULCOLAX) 10 MG suppository Place 1 suppository (10 mg total) rectally daily as needed for moderate constipation. 05/14/14  Yes Rhonda G Barrett, PA-C  clopidogrel (PLAVIX) 75 MG tablet Take 1 tablet (75 mg total) by mouth daily. 06/17/14  Yes Liliane Shi, PA-C  ferrous gluconate (FERGON) 324 MG tablet Take 1 tablet (324 mg total) by mouth 2 (two) times daily with a meal. 06/04/14  Yes Gina L Collins, PA-C  fluticasone (FLONASE) 50 MCG/ACT nasal spray Place 1 spray into both nostrils as needed for allergies.    Yes Historical Provider, MD  folic acid (FOLVITE) 1 MG tablet Take 1 tablet (1 mg total) by mouth daily. 06/04/14  Yes Coolidge Breeze, PA-C  furosemide (LASIX) 20 MG tablet Take 1 tablet (20 mg total) by mouth daily. 06/18/14  Yes Scott Joylene Draft, PA-C  loratadine (CLARITIN) 10 MG tablet Take 10 mg by mouth daily as needed for allergies.    Yes Historical Provider, MD  metoprolol tartrate (LOPRESSOR) 25 MG tablet Take 1 tablet (25 mg total) by mouth 2 (two) times daily. 06/22/14  Yes Scott T Kathlen Mody, PA-C  pantoprazole (PROTONIX) 40 MG tablet Take 1 tablet (40 mg total) by mouth daily. 06/17/14  Yes Scott T Kathlen Mody, PA-C  traMADol (ULTRAM) 50 MG tablet Take 50-100 mg by mouth every 6 (six) hours as needed for moderate pain or severe pain.   Yes Historical Provider, MD  feeding supplement, ENSURE COMPLETE, (ENSURE COMPLETE) LIQD Take 237 mLs by mouth 2 (two) times daily  between meals. 05/14/14   Evelene Croon Barrett, PA-C  nitroGLYCERIN (NITROSTAT) 0.4 MG SL tablet Place 0.4 mg under the tongue every 5 (five) minutes as needed for chest pain (MAX 3 TABLETS).     Historical Provider, MD   BP 140/79 mmHg  Pulse 105  Temp(Src) 97.8 F (36.6 C) (Oral)  Resp 20  SpO2 99% Physical Exam  Constitutional: He appears well-developed and well-nourished. No distress.  HENT:  Head: Normocephalic and atraumatic.  Nose: Nose normal.  Mouth/Throat: Uvula is midline and oropharynx is clear and moist.  Eyes: Pupils are equal, round, and reactive to light.  Neck: Normal range of motion. Neck supple. No spinous process  tenderness present. Normal range of motion present.  Cardiovascular: Normal rate and regular rhythm.   Pulmonary/Chest: Effort normal.    Abdominal: Soft. Bowel sounds are normal. He exhibits no distension and no fluid wave. There is no tenderness. There is no rigidity.  Musculoskeletal:  Mild right lower extremity swelling and moderate left lower summary swelling.  Neurological: He is alert.  Cranial nerves II-VIII and X-XII evaluated and show no deficits. Pt alert and oriented x 3 Upper and lower extremity strength is symmetrical and physiologic Normal muscular tone No facial droop Coordination intact No pronator drift    Skin: Skin is warm and dry.  Nursing note and vitals reviewed.   ED Course  Procedures (including critical care time) Labs Review Labs Reviewed  CBC - Abnormal; Notable for the following:    RBC 3.59 (*)    Hemoglobin 11.2 (*)    HCT 33.5 (*)    RDW 15.8 (*)    All other components within normal limits  BASIC METABOLIC PANEL - Abnormal; Notable for the following:    Sodium 129 (*)    Chloride 93 (*)    Glucose, Bld 123 (*)    GFR calc non Af Amer 79 (*)    All other components within normal limits  HEPATIC FUNCTION PANEL - Abnormal; Notable for the following:    Total Protein 5.4 (*)    Albumin 3.4 (*)    All other  components within normal limits  LIPASE, BLOOD  URINALYSIS, ROUTINE W REFLEX MICROSCOPIC  I-STAT TROPOININ, ED  Randolm Idol, ED    Imaging Review Dg Chest 2 View  06/22/2014   CLINICAL DATA:  Vomiting and left arm pain since 1500 hours today. CABG performed 05/28/2014.  EXAM: CHEST  2 VIEW  COMPARISON:  05/31/2014  FINDINGS: Status post median sternotomy and CABG. Heart is normal in size. Aeration at the lung bases has improved. There is minimal persistent left lower lobe atelectasis or scarring. Persistent small right-sided pleural effusion. There has been some improvement in the small left pleural effusion. No pneumothorax or pulmonary edema.  No free intraperitoneal air.  IMPRESSION: 1. Persistent minimal left lower lobe atelectasis. 2. Interval improvement and bibasilar aeration. 3. Persistent small right-sided pleural effusion.   Electronically Signed   By: Nolon Nations M.D.   On: 06/22/2014 18:46     EKG Interpretation   Date/Time:  Tuesday June 22 2014 17:03:58 EDT Ventricular Rate:  102 PR Interval:  224 QRS Duration: 104 QT Interval:  404 QTC Calculation: 526 R Axis:   -52 Text Interpretation:  Atrial flutter with variable A-V block Left anterior  fascicular block Left axis deviation Inferior infarct , age undetermined  Nonspecific T wave abnormality Anterolateral leads When compared with ECG  of 06/05/2014 Nonspecific ST abnormality is now Present Confirmed by  [facility]  MD, Nunzio Cory [phone]) on 06/22/2014 6:48:07 PM      MDM   Final diagnoses:  Hyponatremia  Non-intractable vomiting with nausea, vomiting of unspecified type  DOE (dyspnea on exertion)    Patients lab work shows hyponatremia consistent with dehydration and vomiting but otherwise no acute abnormalities. Cardiology was consulted to see the patient and they agree patient needs admission and have agreed to admit. Discussed case with Dr. Thurnell Garbe who agrees cardiology is the appropriate service to  consult. Pt has not had any chest pains and is currently in no acute distress.  Delta Trop also negative.  Filed Vitals:   06/22/14 2000  BP: 140/79  Pulse:  105  Temp:   Resp: Gr[place], PA-C 06/22/14 2116  Francine Graven, DO 06/25/14 1257

## 2014-06-23 ENCOUNTER — Telehealth: Payer: Self-pay | Admitting: Internal Medicine

## 2014-06-23 DIAGNOSIS — R112 Nausea with vomiting, unspecified: Secondary | ICD-10-CM | POA: Diagnosis not present

## 2014-06-23 DIAGNOSIS — E871 Hypo-osmolality and hyponatremia: Secondary | ICD-10-CM | POA: Diagnosis not present

## 2014-06-23 DIAGNOSIS — E860 Dehydration: Secondary | ICD-10-CM | POA: Diagnosis not present

## 2014-06-23 DIAGNOSIS — I4892 Unspecified atrial flutter: Secondary | ICD-10-CM | POA: Diagnosis not present

## 2014-06-23 DIAGNOSIS — Z7901 Long term (current) use of anticoagulants: Secondary | ICD-10-CM | POA: Diagnosis not present

## 2014-06-23 LAB — BASIC METABOLIC PANEL
Anion gap: 7 (ref 5–15)
BUN: 8 mg/dL (ref 6–23)
CO2: 27 mmol/L (ref 19–32)
CREATININE: 0.89 mg/dL (ref 0.50–1.35)
Calcium: 9 mg/dL (ref 8.4–10.5)
Chloride: 101 mmol/L (ref 96–112)
GFR calc Af Amer: >90 mL/min (ref >90)
GFR calc non Af Amer: 82 mL/min — ABNORMAL LOW (ref >90)
GLUCOSE: 98 mg/dL (ref 70–99)
POTASSIUM: 3.9 mmol/L (ref 3.5–5.1)
Sodium: 135 mmol/L (ref 135–145)

## 2014-06-23 MED ORDER — ONDANSETRON HCL 4 MG/2ML IJ SOLN
4.0000 mg | Freq: Once | INTRAMUSCULAR | Status: AC
  Administered 2014-06-23: 4 mg via INTRAVENOUS
  Filled 2014-06-23: qty 2

## 2014-06-23 NOTE — Telephone Encounter (Signed)
[name] from [facility] care need a verbal order for occupational Therapy. [phone]

## 2014-06-23 NOTE — Telephone Encounter (Signed)
Agree. cdm 

## 2014-06-23 NOTE — Progress Notes (Signed)
Arrival Method: Patient arrived in stretcher from ED. Mental Orientation: axo x 4 Telemetry: 23 Assessment: See Doc Flow sheets. Skin: Warm, dry and intact. IV: Peripheral  r arm  Ns  70 Pain:.denies Fall Prevention Safety Plan: Patient educated about fall prevention safety plan, understood and acknowledged. Admission Screening:6700 Orientation: Patient has been oriented to the unit, staff and to the room.

## 2014-06-23 NOTE — Progress Notes (Signed)
INITIAL NUTRITION ASSESSMENT   Pt meets criteria for SEVERE MALNUTRITION in the context of chronic illness as evidenced by a 11.6% weight loss in 2 months and severe fat mass loss.  DOCUMENTATION CODES Per approved criteria  -Severe malnutrition in the context of chronic illness   INTERVENTION: Continue Ensure Enlive po BID, each supplement provides 350 kcal and 20 grams of protein.  Encourage adequate PO intake.  NUTRITION DIAGNOSIS: Malnutrition related to chronic illness as evidenced by a 11.6% weight loss in 2 months and severe fat mass.   Goal: Pt to meet >/= 90% of their estimated nutrition needs   Monitor:  PO intake, weight trends, labs, I/O's  Reason for Assessment: MST  74 y.o. male  Admitting Dx: Dehydration  ASSESSMENT: Pt with a hx of CAD, s/p CABG in 1993, HL, PAD s/p stent to L SFA 5/11 and R SFA PTA 6/11. Pt with nausea and vomiting PTA.   Pt reports his appetite is fine, however reports after eating breakfast this AM he was starting to have nausea with abdominal discomfort. Meal completion is 50-100%. Pt reports he has been eating fine at home with 2 meals a day and a snack in between. Pt reports weight loss with usual body weight of 172 lbs. Per Epic weight records, pt with a 11.6% weight loss in 2 months. Pt currently has Ensure ordered. Will continue with current orders. Pt was encouraged to eat his food at meals and to drink his supplements.  Nutrition Focused Physical Exam:  Subcutaneous Fat:  Orbital Region: N/A Upper Arm Region: Moderate to Severe depletion Thoracic and Lumbar Region: WNL  Muscle:  Temple Region: N/A Clavicle Bone Region: Moderate depletion Clavicle and Acromion Bone Region: Moderate depletion Scapular Bone Region: N/A Dorsal Hand: N/A Patellar Region: Moderate depletion Anterior Thigh Region: Moderate depletion Posterior Calf Region: Moderate depletion  Edema: Generalized  Labs and medications reviewed.  Height: Ht  Readings from Last 1 Encounters:  06/22/14 5\' 11"  (1.803 m)    Weight: Wt Readings from Last 1 Encounters:  06/22/14 152 lb 1.9 oz (69 kg)    Ideal Body Weight: 172 lbs  % Ideal Body Weight: 88%  Wt Readings from Last 10 Encounters:  06/22/14 152 lb 1.9 oz (69 kg)  06/17/14 156 lb (70.761 kg)  06/05/14 161 lb 9.6 oz (73.301 kg)  05/13/14 153 lb 3.5 oz (69.5 kg)  04/14/14 172 lb (78.019 kg)  01/11/14 168 lb (76.204 kg)  09/02/13 173 lb (78.472 kg)  08/05/13 170 lb 12.8 oz (77.474 kg)  06/23/13 169 lb (76.658 kg)  03/25/13 172 lb (78.019 kg)    Usual Body Weight: 172 lbs  % Usual Body Weight: 88%  BMI:  Body mass index is 21.23 kg/(m^2).  Estimated Nutritional Needs: Kcal: 1850-2050 Protein: 85-100 grams Fluid: 1.9 - 2 L/day  Skin: generalized edema, incision on chest, incision on left leg  Diet Order: Diet 2 gram sodium  EDUCATION NEEDS: -No education needs identified at this time   Intake/Output Summary (Last 24 hours) at 06/23/14 0958 Last data filed at 06/23/14 0843  Gross per 24 hour  Intake    360 ml  Output   1450 ml  Net  -1090 ml    Last BM: 3/15  Labs:   Recent Labs Lab 06/17/14 1307 06/22/14 1731 06/23/14 0555  NA 131* 129* 135  K 4.6 3.5 3.9  CL 95* 93* 101  CO2 31 25 27   BUN 14 11 8   CREATININE 1.00 0.96 0.89  CALCIUM 9.4 9.1 9.0  GLUCOSE 117* 123* 98    CBG (last 3)  No results for input(s): GLUCAP in the last 72 hours.  Scheduled Meds: . amiodarone  400 mg Oral BID  . apixaban  5 mg Oral BID  . atorvastatin  40 mg Oral QHS  . clopidogrel  75 mg Oral Daily  . feeding supplement (ENSURE COMPLETE)  237 mL Oral BID BM  . metoprolol tartrate  25 mg Oral BID  . ondansetron  4 mg Intravenous Once  . pantoprazole  40 mg Oral Daily    Continuous Infusions: . sodium chloride 1,000 mL (06/23/14 0102)    Past Medical History  Diagnosis Date  . CAD (coronary artery disease)     CABG x 2 with LIMA to Circumflex and vein  graft to PDA  07/13/1991  . History of transient ischemic attack (TIA)   . Hyperlipidemia   . Peripheral neuropathy   . PVD (peripheral vascular disease) with claudication   . Carotid bruit     bilateral  . Ventral hernia   . Personal history of prostate cancer   . Diverticulosis   . ALLERGIC RHINITIS   . Osteoarthritis   . COPD (chronic obstructive pulmonary disease)   . Tobacco use disorder, continuous   . STEMI (ST elevation myocardial infarction) 04/29/2014    PTCA Lmain and CFX, in setting of VF/VT arrest and CGS    Past Surgical History  Procedure Laterality Date  . Coronary artery bypass graft  1992  . Lumbar laminectomy  [id number]     X2  . Prostatectomy  05/2008    Dr. Alinda Money and XRT  . Appendectomy    . Tonsillectomy    . Left heart catheterization with coronary angiogram N/A 04/30/2014    Procedure: LEFT HEART CATHETERIZATION WITH CORONARY ANGIOGRAM;  Surgeon: Peter M Martinique, MD; Lmain 90% (s/p PTCA), LAD 80%, CFX 100% (s/p PTCA), RCA OK, LIMA-LAD atretic, SVG-OM 100% (chronic), EF 45%, pt req defib x 13 for VF/VT arrest, CHB w/ tem pacer, IABP and intubation  . Coronary artery bypass graft N/A 05/28/2014    Procedure: REDO CORONARY ARTERY BYPASS GRAFTING (CABG);  Surgeon: Grace Isaac, MD;  Location: [facility];  Service: Open Heart Surgery;  Laterality: N/A;  Times 4 using right internal mammary artery to Intermediate artery and endoscopically harvested left saphenous vein to LAD, OM1, and PD coronary arteries.  Darden Dates without cardioversion N/A 05/28/2014    Procedure: TRANSESOPHAGEAL ECHOCARDIOGRAM (TEE);  Surgeon: Grace Isaac, MD;  Location: [facility];  Service: Open Heart Surgery;  Laterality: N/A;    Kallie Locks, MS, RD, LDN Pager # [phone] After hours/ weekend pager # [phone]

## 2014-06-23 NOTE — Progress Notes (Signed)
Patient Name: [name] Date of Encounter: 06/23/2014     Principal Problem:   Dehydration Active Problems:   Hyperlipidemia   Chronic anticoagulation-on Eliquis at discharge 05/11/14   ICM-EF 45-50% by echo 04/30/14   S/P CABG x 4   Orthostatic hypotension   Atrial flutter with rapid ventricular response    SUBJECTIVE  Still mildly dizzy and nauseated but feeling better.  No CP or SOB. No LE swelling, orthopnea or PND. Admits he was drinking a lot of coffee and resumed smoking cigarettes before admission  CURRENT MEDS . amiodarone  400 mg Oral BID  . apixaban  5 mg Oral BID  . atorvastatin  40 mg Oral QHS  . clopidogrel  75 mg Oral Daily  . feeding supplement (ENSURE COMPLETE)  237 mL Oral BID BM  . metoprolol tartrate  25 mg Oral BID  . ondansetron  4 mg Intravenous Once  . pantoprazole  40 mg Oral Daily    OBJECTIVE  Filed Vitals:   06/22/14 2300 06/22/14 2351 06/23/14 0606 06/23/14 0801  BP: 120/76 124/62 131/76 125/84  Pulse: 106 109 109 108  Temp:  98.2 F (36.8 C) 97.9 F (36.6 C) 99.1 F (37.3 C)  TempSrc:  Oral Oral Oral  Resp: 15 16 17 18   Height:  5\' 11"  (1.803 m)    Weight:  152 lb 1.9 oz (69 kg)    SpO2: 97% 99% 97% 98%    Intake/Output Summary (Last 24 hours) at 06/23/14 1117 Last data filed at 06/23/14 1003  Gross per 24 hour  Intake    360 ml  Output   1550 ml  Net  -1190 ml   Filed Weights   06/22/14 2351  Weight: 152 lb 1.9 oz (69 kg)    PHYSICAL EXAM  General: Pleasant, NAD. Chronically ill appreaing Neuro: Alert and oriented X 3. Moves all extremities spontaneously. Psych: Normal affect. HEENT:  Normal  Neck: Supple without bruits or JVD. Lungs:  Resp regular and unlabored, CTA. Heart: irreg irreg. no s3, s4, or murmurs. Abdomen: Soft, non-tender, non-distended, BS + x 4.  Extremities: No clubbing, cyanosis or edema. DP/PT/Radials 2+ and equal bilaterally.  Accessory Clinical Findings  CBC  Recent Labs  06/22/14 1731  WBC 6.6  HGB 11.2*  HCT 33.5*  MCV 93.3  PLT 350   Basic Metabolic Panel  Recent Labs  06/22/14 1731 06/23/14 0555  NA 129* 135  K 3.5 3.9  CL 93* 101  CO2 25 27  GLUCOSE 123* 98  BUN 11 8  CREATININE 0.96 0.89  CALCIUM 9.1 9.0   Liver Function Tests  Recent Labs  06/22/14 1945  AST 21  ALT 30  ALKPHOS 82  BILITOT 0.6  PROT 5.4*  ALBUMIN 3.4*    Recent Labs  06/22/14 1945  LIPASE 33    TELE  Course atrial fib/flutter with CVR (HR in 80s)  Radiology/Studies  Ct Abdomen Pelvis W Wo Contrast  05/26/2014   CLINICAL DATA:  Status post robotic radical prostatectomy 6 years ago with chronic painless hematuria, preop cardiac bypass on Friday  EXAM: CT ABDOMEN AND PELVIS WITHOUT AND WITH CONTRAST  TECHNIQUE: Multidetector CT imaging of the abdomen and pelvis was performed following the standard protocol before and following the bolus administration of intravenous contrast.  CONTRAST:  170mL OMNIPAQUE IOHEXOL 300 MG/ML  SOLN  COMPARISON:  11/04/2009  FINDINGS: Lower chest: Emphysematous changes with dependent atelectasis at the lung bases.  Hepatobiliary: 3 mm cyst in  the posterior segment right hepatic lobe (series 301/ image 12). No suspicious/enhancing hepatic lesions.  Layering gallbladder sludge and/or gallstones (series 31/ image 34). No intrahepatic or extrahepatic ductal dilatation.  Pancreas: Within normal limits.  Spleen: Within normal limits.  Adrenals/Urinary Tract: Adrenal glands are unremarkable.  4 mm cyst in the lateral left upper kidney. Right kidney is within normal limits. No enhancing renal lesions. No hydronephrosis.  No renal, ureteral, or bladder calculi.  Bladder is within normal limits.  Stomach/Bowel: Stomach is moderately distended but within normal limits.  No evidence of bowel obstruction.  Rectosigmoid colon is mildly thick-walled but underdistended.  Vascular/Lymphatic: Atherosclerotic calcifications of the abdominal aorta and  branch vessels.  No suspicious abdominopelvic lymphadenopathy.  Reproductive: Status post prostatectomy.  Other: No abdominopelvic ascites.  Tiny fat containing right inguinal hernia.  Musculoskeletal: Degenerative changes of the visualized thoracolumbar spine.  IMPRESSION: No renal, ureteral, or bladder calculi.  No hydronephrosis.  4 mm cyst in the left upper kidney.  No enhancing renal lesions.  Status post prostatectomy. No evidence of metastatic disease in the abdomen/pelvis.   Electronically Signed   By: Julian Hy M.D.   On: 05/26/2014 12:32   Dg Chest 2 View  06/22/2014   CLINICAL DATA:  Vomiting and left arm pain since 1500 hours today. CABG performed 05/28/2014.  EXAM: CHEST  2 VIEW  COMPARISON:  05/31/2014  FINDINGS: Status post median sternotomy and CABG. Heart is normal in size. Aeration at the lung bases has improved. There is minimal persistent left lower lobe atelectasis or scarring. Persistent small right-sided pleural effusion. There has been some improvement in the small left pleural effusion. No pneumothorax or pulmonary edema.  No free intraperitoneal air.  IMPRESSION: 1. Persistent minimal left lower lobe atelectasis. 2. Interval improvement and bibasilar aeration. 3. Persistent small right-sided pleural effusion.   Electronically Signed   By: Nolon Nations M.D.   On: 06/22/2014 18:46   Dg Chest 2 View  05/31/2014   CLINICAL DATA:  Coronary artery disease post CABG, history COPD, MI, hyperlipidemia, smoker, prostate cancer  EXAM: CHEST  2 VIEW  COMPARISON:  05/30/2014  FINDINGS: Interval removal of RIGHT jugular line.  Upper normal heart size post CABG.  Atherosclerotic calcification aorta.  Mediastinal contours and pulmonary vascularity normal.  Emphysematous changes with bibasilar atelectasis and small pleural effusions.  Remaining lungs clear.  No pneumothorax or acute osseous findings.  IMPRESSION: Post CABG.  COPD changes with bibasilar atelectasis and small pleural  effusions.   Electronically Signed   By: Lavonia Dana M.D.   On: 05/31/2014 08:07   Ct Head Wo Contrast  06/18/2014   CLINICAL DATA:  74 year old male who fell with posterior head injury. Hematoma. Initial encounter.  EXAM: CT HEAD WITHOUT CONTRAST  TECHNIQUE: Contiguous axial images were obtained from the base of the skull through the vertex without intravenous contrast.  COMPARISON:  Brain MRI 04/21/2009.  FINDINGS: No focal scalp hematoma identified. No acute orbits soft tissue finding. Mild ethmoid sinus mucosal thickening. Other Visualized paranasal sinuses and mastoids are clear. Calvarium intact.  Calcified atherosclerosis at the skull base. Cerebral volume is stable and within normal limits for age. No midline shift, ventriculomegaly, mass effect, evidence of mass lesion, intracranial hemorrhage or evidence of cortically based acute infarction. Gray-white matter differentiation is within normal limits throughout the brain. No suspicious intracranial vascular hyperdensity.  IMPRESSION: Normal for age non contrast CT appearance of the brain. No acute traumatic injury identified.   Electronically Signed  By: Genevie Ann M.D.   On: 06/18/2014 16:52   Dg Chest Port 1 View  05/30/2014   CLINICAL DATA:  Status post CABG x3  EXAM: PORTABLE CHEST - 1 VIEW  COMPARISON:  05/29/2014  FINDINGS: Swan-Ganz catheter is been removed. The right jugular sheath remains. The right chest tube has been removed. Mediastinal drain is been removed. No pneumothorax is evident. Mild atelectatic appearing basilar opacities persist bilaterally.  IMPRESSION: Removal of mediastinal drain, right chest tube and SG catheter. Persistent mild atelectatic opacities   Electronically Signed   By: Andreas Newport M.D.   On: 05/30/2014 05:44   Dg Chest Port 1 View  05/29/2014   CLINICAL DATA:  CABG x4  EXAM: PORTABLE CHEST - 1 VIEW  COMPARISON:  05/28/2014  FINDINGS: There is a right chest tube appearing unchanged in position. There is a  right jugular Swan-Ganz catheter extending well out the right lower lobe artery. Mediastinal drain noted. Endotracheal tube and nasogastric tube have been removed. Left chest tube has been removed. No pneumothorax is evident. Mild atelectatic appearing basilar opacities have partially cleared.  IMPRESSION: Removal of the endotracheal tube, nasogastric tube and left chest tube. Swan-Ganz catheter extends far out the right lower lobe pulmonary artery. Partial clearance of basilar opacities.   Electronically Signed   By: Andreas Newport M.D.   On: 05/29/2014 05:42   Dg Chest Port 1 View  05/28/2014   CLINICAL DATA:  Status post coronary artery bypass grafting. Hypoxia.  EXAM: PORTABLE CHEST - 1 VIEW  COMPARISON:  May 20, 2014  FINDINGS: Patient is status post coronary artery bypass grafting. Endotracheal tube tip is 6.6 cm above the carina. The Swan-Ganz catheter tip is in the midportion of the right interlobar pulmonary artery. The tip is approximately 5 cm distal to the main right pulmonary artery. Nasogastric tube tip and side port are in the stomach. There is a chest tube on the right. Temporary pacemaker wires are attached to the right heart. No pneumothorax.  There is left lower lobe consolidation with small left effusion. There is subsegmental atelectasis in the right base. Heart size is normal. Pulmonary vascularity is normal. There is atherosclerotic change in the aorta.  IMPRESSION: Tube and catheter positions as described without pneumothorax. Note that the Swan-Ganz catheter tip is well into the right interlobar pulmonary artery with the tip approximately 5 cm distal to the right main pulmonary artery.  There is left lower lobe consolidation with small left effusion. There is mild right base atelectasis.  Heart size is within normal limits.   Electronically Signed   By: Lowella Grip III M.D.   On: 05/28/2014 16:05    ASSESSMENT AND PLAN  Mr. Yandell is a 74 year old gentleman with a  history of CAD s/p CABG x2 (1993) and redo CABG x4V (05/28/14), TIA, PVD s/p stent to L SFA 5/11 and R SFA PTA 6/11, HLD, COPD, tobacco abuse, atrial fib/flutter on amio and Eliquis who presented to [facility] on 06/22/14 with n/v and dizziness and found to be dehydrated with orthostatic hypotension and atrial flutter with RVR.   Dehydration- He was recently started on Lasix 40, then decreased to 20 mg po daily for LE edema, he is drinking excessive amount of coffee and minimal amount of fluids -- His lasix was discontinued on admission and he was given gentle IVFs.   Orthostatic hypotension- found to be orthostatic on admission. Repeat orthostatics this AM remain positive. (Lying) 131/76 --> (Sitting) 112/59 --> (Standing) 83/47.  --  Consider more IVFs? Will defer to MD  Hypokalemia/hyponatremia- now resolved  Atypical atrial flutter: HR is now controlled in 80s. -- He has been on anticoagulation with Eliquis since discharge on 06/04/14. If felt medically stable he could continue [facility] for at least 1-2 more weeks and do outpatient DCCV or TEE/DCCV while inpatient. Will defer to Dr. Tamala Julian.  -- Continue Amiodarone to 400 mg BIDa and metoprolol 25mg  BID  CAD- s/p recent redo CABG x 4 (RIMA-Int, SVG- disal LAD, SVG-OM, SVG-PD) on 1/65/53 complicated by recurrent atrial fib/flutter.  -- Troponin neg x2. No objective signs of ischemia. -- No ASA as he is on Eliquis. Continue BB and statin  Ischemic cardiomyopathy/chronic systolic CHF: Continue beta-blocker. Consider ACE inhibitor at FU if BP and renal function stable.  -- Holding lasix due to dehydration. Will need to send home on maintainence dose diuretics to avoid rebound CHF   Hyperlipidemia: Continue statin.  GERD -- Continue Protonix 40 mg Once daily.   Recent fall- also fell the last week and had a head CT done in [facility] ER, that shoed no hematoma and no intracranial bleed.   Judy Pimple PA-C  Pager [phone]

## 2014-06-24 ENCOUNTER — Encounter (HOSPITAL_COMMUNITY): Payer: Self-pay | Admitting: *Deleted

## 2014-06-24 ENCOUNTER — Observation Stay (HOSPITAL_COMMUNITY): Payer: Medicare Other | Admitting: Anesthesiology

## 2014-06-24 ENCOUNTER — Ambulatory Visit: Payer: Self-pay | Admitting: Physician Assistant

## 2014-06-24 ENCOUNTER — Encounter (HOSPITAL_COMMUNITY)
Admission: EM | Disposition: A | Discharge status: Home / Self Care | Payer: Self-pay | Source: Home / Self Care | Attending: Emergency Medicine

## 2014-06-24 ENCOUNTER — Other Ambulatory Visit: Payer: Self-pay

## 2014-06-24 DIAGNOSIS — I061 Rheumatic aortic insufficiency: Secondary | ICD-10-CM | POA: Diagnosis not present

## 2014-06-24 DIAGNOSIS — I340 Nonrheumatic mitral (valve) insufficiency: Secondary | ICD-10-CM

## 2014-06-24 DIAGNOSIS — I4891 Unspecified atrial fibrillation: Secondary | ICD-10-CM

## 2014-06-24 DIAGNOSIS — E860 Dehydration: Secondary | ICD-10-CM | POA: Diagnosis not present

## 2014-06-24 DIAGNOSIS — I4892 Unspecified atrial flutter: Secondary | ICD-10-CM | POA: Diagnosis not present

## 2014-06-24 DIAGNOSIS — T462X5A Adverse effect of other antidysrhythmic drugs, initial encounter: Secondary | ICD-10-CM | POA: Diagnosis not present

## 2014-06-24 DIAGNOSIS — I255 Ischemic cardiomyopathy: Secondary | ICD-10-CM | POA: Diagnosis not present

## 2014-06-24 DIAGNOSIS — Z7901 Long term (current) use of anticoagulants: Secondary | ICD-10-CM | POA: Diagnosis not present

## 2014-06-24 DIAGNOSIS — Z951 Presence of aortocoronary bypass graft: Secondary | ICD-10-CM | POA: Diagnosis not present

## 2014-06-24 DIAGNOSIS — I5022 Chronic systolic (congestive) heart failure: Secondary | ICD-10-CM | POA: Diagnosis not present

## 2014-06-24 HISTORY — PX: TEE WITHOUT CARDIOVERSION: SHX5443

## 2014-06-24 HISTORY — PX: CARDIOVERSION: SHX1299

## 2014-06-24 SURGERY — ECHOCARDIOGRAM, TRANSESOPHAGEAL
Anesthesia: Monitor Anesthesia Care

## 2014-06-24 MED ORDER — METOPROLOL TARTRATE 12.5 MG HALF TABLET
12.5000 mg | ORAL_TABLET | Freq: Two times a day (BID) | ORAL | Status: DC
  Administered 2014-06-25: 12.5 mg via ORAL
  Filled 2014-06-24 (×2): qty 1

## 2014-06-24 MED ORDER — HYDROMORPHONE HCL 1 MG/ML IJ SOLN: 0.2500 mg | INTRAMUSCULAR | Status: DC | PRN

## 2014-06-24 MED ORDER — ONDANSETRON HCL 4 MG/2ML IJ SOLN: 4.0000 mg | Freq: Once | INTRAMUSCULAR | Status: DC | PRN

## 2014-06-24 MED ORDER — LIDOCAINE VISCOUS 2 % MT SOLN
OROMUCOSAL | Status: AC
  Filled 2014-06-24: qty 15

## 2014-06-24 MED ORDER — LIDOCAINE VISCOUS 2 % MT SOLN
OROMUCOSAL | Status: DC | PRN
  Administered 2014-06-24: 1 via OROMUCOSAL

## 2014-06-24 MED ORDER — MENTHOL 3 MG MT LOZG
1.0000 | LOZENGE | OROMUCOSAL | Status: DC | PRN
  Administered 2014-06-24: 3 mg via ORAL
  Filled 2014-06-24: qty 9

## 2014-06-24 MED ORDER — SODIUM CHLORIDE 0.9 % IV SOLN
INTRAVENOUS | Status: DC | PRN
  Administered 2014-06-24: 14:00:00 via INTRAVENOUS

## 2014-06-24 MED ORDER — BUTAMBEN-TETRACAINE-BENZOCAINE 2-2-14 % EX AERO
INHALATION_SPRAY | CUTANEOUS | Status: DC | PRN
  Administered 2014-06-24: 2 via TOPICAL

## 2014-06-24 MED ORDER — SODIUM CHLORIDE 0.9 % IV SOLN: INTRAVENOUS | Status: DC

## 2014-06-24 MED ORDER — PROPOFOL 10 MG/ML IV BOLUS
INTRAVENOUS | Status: DC | PRN
  Administered 2014-06-24: 20 mg via INTRAVENOUS

## 2014-06-24 MED ORDER — PROPOFOL INFUSION 10 MG/ML OPTIME
INTRAVENOUS | Status: DC | PRN
  Administered 2014-06-24: 75 ug/kg/min via INTRAVENOUS

## 2014-06-24 MED ORDER — PHENYLEPHRINE HCL 10 MG/ML IJ SOLN
10.0000 mg | INTRAVENOUS | Status: DC | PRN
  Administered 2014-06-24: 20 ug/min via INTRAVENOUS

## 2014-06-24 NOTE — CV Procedure (Signed)
TEE/CARDIOVERSION NOTE  TRANSESOPHAGEAL ECHOCARDIOGRAM (TEE):  Indictation: Atrial Fibrillation  Consent:   Informed consent was obtained prior to the procedure. The risks, benefits and alternatives for the procedure were discussed and the patient comprehended these risks.  Risks include, but are not limited to, cough, sore throat, vomiting, nausea, somnolence, esophageal and stomach trauma or perforation, bleeding, low blood pressure, aspiration, pneumonia, infection, trauma to the teeth and death.    Time Out: Verified patient identification, verified procedure, site/side was marked, verified correct patient position, special equipment/implants available, medications/allergies/relevent history reviewed, required imaging and test results available. Performed  Procedure:  After a procedural time-out, the patient was given propofol per anesthesia sedation.  The oropharynx was anesthetized with cetacaine spray.  The transesophageal probe was inserted in the esophagus and stomach without difficulty and multiple views were obtained.  The patient was kept under observation until the patient left the procedure room.  The patient left the procedure room in stable condition.   Agitated microbubble saline contrast was not administered.  Complications:    Complications: None Patient did tolerate procedure well.  Findings:  1. LEFT VENTRICLE: The left ventricular wall thickness is mildly increased, except for the inferior wall, which is thin.  The left ventricular cavity is normal in size. Wall motion is notable for inferoseptal hypokinesis.  LVEF is 45%.  2. RIGHT VENTRICLE:  The right ventricle is normal in structure and function without any thrombus or masses.    3. LEFT ATRIUM:  The left atrium is dilated in size without any thrombus or masses.  There is not spontaneous echo contrast ("smoke") in the left atrium consistent with a low flow state.  4. LEFT ATRIAL APPENDAGE:  The left  atrial appendage has prominent pectinate muscles, but is free of any thrombus or masses. The appendage has single lobes. Pulse doppler indicates moderate flow in the appendage.  5. ATRIAL SEPTUM:  The atrial septum appears intact and is free of thrombus and/or masses.  There is no evidence for interatrial shunting by color doppler and saline microbubble.  6. RIGHT ATRIUM:  The right atrium is normal in size and function without any thrombus or masses.  7. MITRAL VALVE:  The mitral valve is normal in structure and function with Mild regurgitation with several small regurgitant jets  There were no vegetations or stenosis.  8. AORTIC VALVE:  The aortic valve is trileaflet and mildly sclerotic, with no regurgitation.  There were no vegetations or stenosis  9. TRICUSPID VALVE:  The tricuspid valve is normal in structure and function with Mild regurgitation.  There were no vegetations or stenosis  10.  PULMONIC VALVE:  The pulmonic valve is normal in structure and function with Mild regurgitation.  There were no vegetations or stenosis.   11. AORTIC ARCH, ASCENDING AND DESCENDING AORTA:  There was grade 1 Ron Parker et. Al, 1992) atherosclerosis of the proximal descending aorta.  12. PULMONARY VEINS: Anomalous pulmonary venous return was not noted.  13. PERICARDIUM: The pericardium appeared normal and non-thickened.  There is no pericardial effusion.  CARDIOVERSION:     Second Time Out: Verified patient identification, verified procedure, site/side was marked, verified correct patient position, special equipment/implants available, medications/allergies/relevent history reviewed, required imaging and test results available.  Performed  Procedure:  1. Patient placed on cardiac monitor, pulse oximetry, supplemental oxygen as necessary.  2. Sedation administered per anesthesia 3. Pacer pads placed anterior and posterior chest. 4. Cardioverted 1 time(s).  5. Cardioverted at 120J  biphasic.  Complications:  Complications: None Patient did tolerate procedure well.  Impression:  1. No LAA thrombus 2. No PFO or ASD by color doppler 3. LVEF 45% with inferoseptal hypokinesis and thinning 4. Mild MR with multiple small regurgitant jets 5. Mild TR, mild PI 6. Aortic sclerosis 7. Successful DCCV with a single 120J biphasic shock to sinus bradycardia  Recommendations:  1.  Post-operative bradycardia and mild hypotension noted. He was given a small amount of ephedrine by anesthesia. Will hold pm b-blocker dose and restart metoprolol at 12.5 mg BID tomorrow. Continue amiodarone. Continue Eliquis.  Time Spent Directly with the Patient:  60 minutes   Pixie Casino, MD, [facility] Attending Cardiologist [facility]  06/24/2014, 3:02 PM

## 2014-06-24 NOTE — Telephone Encounter (Signed)
Left detailed mess informing caller ok for OT.

## 2014-06-24 NOTE — Progress Notes (Signed)
Echocardiogram Echocardiogram Transesophageal has been performed.  Jaidalyn Schillo 06/24/2014, 2:51 PM

## 2014-06-24 NOTE — Progress Notes (Signed)
       Patient Name: KHAYREE DELELLIS Date of Encounter: 06/24/2014    SUBJECTIVE:One episode with nausea and vomiting last PM. Otherwise doing okay.  TELEMETRY:  A flutter with variable rate control. Filed Vitals:   06/23/14 1727 06/23/14 2224 06/24/14 0455 06/24/14 1108  BP: 111/59 109/55 109/71 121/66  Pulse: 106 103 104 107  Temp: 98.2 F (36.8 C) 97.9 F (36.6 C) 98 F (36.7 C) 97.9 F (36.6 C)  TempSrc: Oral Oral Oral Oral  Resp: 17 18 20 18   Height:      Weight:      SpO2: 97% 97% 95% 96%    Intake/Output Summary (Last 24 hours) at 06/24/14 1234 Last data filed at 06/23/14 2237  Gross per 24 hour  Intake    240 ml  Output    775 ml  Net   -535 ml   LABS: Basic Metabolic Panel:  Recent Labs  06/22/14 1731 06/23/14 0555  NA 129* 135  K 3.5 3.9  CL 93* 101  CO2 25 27  GLUCOSE 123* 98  BUN 11 8  CREATININE 0.96 0.89  CALCIUM 9.1 9.0   CBC:  Recent Labs  06/22/14 1731  WBC 6.6  HGB 11.2*  HCT 33.5*  MCV 93.3  PLT 357     Radiology/Studies:  No new data  Physical Exam: Blood pressure 121/66, pulse 107, temperature 97.9 F (36.6 C), temperature source Oral, resp. rate 18, height 5\' 11"  (1.803 m), weight 152 lb 1.9 oz (69 kg), SpO2 96 %. Weight change:   Wt Readings from Last 3 Encounters:  06/22/14 152 lb 1.9 oz (69 kg)  06/17/14 156 lb (70.761 kg)  06/05/14 161 lb 9.6 oz (73.301 kg)    Lying comfortably Chest is clear Cardiac with no gallop or murmur Neuro is stable but with decreased memory and hearing.  ASSESSMENT:  1. A flutter with variable rate 2. A/C diastolic heart failure with reasonable control. 3. Nausea and vomiting likely related to Amio. 4. Dehydration, is improved.  Plan:  1. TEE Cardioversion 2. Decrease amiodarone oral dose to 400 mg daily and likely discharge tomorrow on 200 mg daily. 3. Probable AM discharge assuming no complications with procedure.  Demetrios Isaacs 06/24/2014, 12:34 PM

## 2014-06-24 NOTE — Anesthesia Postprocedure Evaluation (Signed)
  Anesthesia Post-op Note  Patient: [name]  Procedure(s) Performed: Procedure(s): TRANSESOPHAGEAL ECHOCARDIOGRAM (TEE) (N/A) CARDIOVERSION (N/A)  Patient Location: PACU  Anesthesia Type:MAC  Level of Consciousness: awake, alert , oriented and patient cooperative  Airway and Oxygen Therapy: Patient Spontanous Breathing  Post-op Pain: none  Post-op Assessment: Post-op Vital signs reviewed, Patient's Cardiovascular Status Stable, Respiratory Function Stable, Patent Airway, No signs of Nausea or vomiting and Pain level controlled  Post-op Vital Signs: stable  Last Vitals:  Filed Vitals:   06/24/14 1440  BP: 95/50  Pulse: 52  Temp:   Resp: 20    Complications: No apparent anesthesia complications

## 2014-06-24 NOTE — Anesthesia Preprocedure Evaluation (Signed)
Anesthesia Evaluation  Patient identified by MRN, date of birth, ID band Patient awake    Reviewed: Allergy & Precautions, NPO status , Patient's Chart, lab work & pertinent test results  Airway        Dental   Pulmonary COPDCurrent Smoker,          Cardiovascular + CAD, + Past MI, + CABG and + Peripheral Vascular Disease + dysrhythmias Atrial Fibrillation     Neuro/Psych  Neuromuscular disease    GI/Hepatic   Endo/Other    Renal/GU      Musculoskeletal  (+) Arthritis -,   Abdominal   Peds  Hematology  (+) anemia ,   Anesthesia Other Findings   Reproductive/Obstetrics                             Anesthesia Physical Anesthesia Plan  ASA: IV  Anesthesia Plan: MAC   Post-op Pain Management:    Induction: Intravenous  Airway Management Planned: Simple Face Mask  Additional Equipment:   Intra-op Plan:   Post-operative Plan:   Informed Consent: I have reviewed the patients History and Physical, chart, labs and discussed the procedure including the risks, benefits and alternatives for the proposed anesthesia with the patient or authorized representative who has indicated his/her understanding and acceptance.     Plan Discussed with: CRNA, Anesthesiologist and Surgeon  Anesthesia Plan Comments:         Anesthesia Quick Evaluation

## 2014-06-24 NOTE — Progress Notes (Signed)
UR completed 

## 2014-06-24 NOTE — Anesthesia Procedure Notes (Signed)
Procedure Name: MAC Date/Time: 06/24/2014 2:00 PM Performed by: Izora Gala Pre-anesthesia Checklist: Patient identified, Emergency Drugs available and Suction available Patient Re-evaluated:Patient Re-evaluated prior to inductionOxygen Delivery Method: Simple face mask and Nasal cannula

## 2014-06-24 NOTE — Transfer of Care (Signed)
Immediate Anesthesia Transfer of Care Note  Patient: [name]  Procedure(s) Performed: Procedure(s): TRANSESOPHAGEAL ECHOCARDIOGRAM (TEE) (N/A) CARDIOVERSION (N/A)  Patient Location: PACU and Endoscopy Unit  Anesthesia Type:MAC and General  Level of Consciousness: awake, alert , oriented and patient cooperative  Airway & Oxygen Therapy: Patient Spontanous Breathing and Patient connected to nasal cannula oxygen  Post-op Assessment: Report given to RN, Post -op Vital signs reviewed and stable and Patient moving all extremities  Post vital signs: Reviewed and stable  Last Vitals:  Filed Vitals:   06/24/14 1440  BP: 95/50  Pulse: 52  Temp:   Resp: 20    Complications: No apparent anesthesia complications

## 2014-06-24 NOTE — H&P (Signed)
     INTERVAL PROCEDURE H&P  History and Physical Interval Note:  06/24/2014 2:00 PM  [name] has presented today for their planned procedure. The various methods of treatment have been discussed with the patient and family. After consideration of risks, benefits and other options for treatment, the patient has consented to the procedure.  The patients' outpatient history has been reviewed, patient examined, and no change in status from most recent office note within the past 30 days. I have reviewed the patients' chart and labs and will proceed as planned. Questions were answered to the patient's satisfaction.   Pixie Casino, MD, [facility] Attending Cardiologist [facility]  Harlean Regula C 06/24/2014, 2:00 PM

## 2014-06-25 ENCOUNTER — Encounter (HOSPITAL_COMMUNITY): Payer: Self-pay | Admitting: Internal Medicine

## 2014-06-25 DIAGNOSIS — F17209 Nicotine dependence, unspecified, with unspecified nicotine-induced disorders: Secondary | ICD-10-CM | POA: Diagnosis present

## 2014-06-25 DIAGNOSIS — I739 Peripheral vascular disease, unspecified: Secondary | ICD-10-CM | POA: Diagnosis present

## 2014-06-25 DIAGNOSIS — J449 Chronic obstructive pulmonary disease, unspecified: Secondary | ICD-10-CM | POA: Diagnosis present

## 2014-06-25 DIAGNOSIS — I4892 Unspecified atrial flutter: Secondary | ICD-10-CM | POA: Diagnosis not present

## 2014-06-25 DIAGNOSIS — Z7901 Long term (current) use of anticoagulants: Secondary | ICD-10-CM | POA: Diagnosis not present

## 2014-06-25 DIAGNOSIS — I951 Orthostatic hypotension: Secondary | ICD-10-CM | POA: Diagnosis not present

## 2014-06-25 DIAGNOSIS — R112 Nausea with vomiting, unspecified: Secondary | ICD-10-CM | POA: Diagnosis not present

## 2014-06-25 DIAGNOSIS — Z951 Presence of aortocoronary bypass graft: Secondary | ICD-10-CM | POA: Diagnosis not present

## 2014-06-25 MED ORDER — AMIODARONE HCL 200 MG PO TABS: 200.0000 mg | ORAL_TABLET | Freq: Every day | ORAL | Status: DC

## 2014-06-25 NOTE — Discharge Summary (Signed)
Discharge Summary   Patient ID: [name] MRN: [id number], DOB/AGE: 09-Jan-1941 74 y.o. Admit date: 06/22/2014 D/C date:     06/25/2014  Primary Cardiologist: Dr. Julianne Handler  Principal Problem:   Atrial flutter with rapid ventricular response Active Problems:   Hyperlipidemia   History of transient ischemic attack (TIA)   Chronic anticoagulation-on Eliquis at discharge 05/11/14   ICM-EF 45-50% by echo 04/30/14   S/P CABG x 4   Orthostatic hypotension   Dehydration   Hyponatremia   Nausea with vomiting   PVD (peripheral vascular disease) with claudication   COPD (chronic obstructive pulmonary disease)   Tobacco use disorder, continuous    Admission Dates:  06/22/14-06/25/14 Discharge Diagnosis: Atrial flutter maintaining NSR after cardioversion on amiodarone.  HPI: [name] is a 74 year old gentleman with a history of CAD s/p CABG x2 (1993) and redo CABG x4V (05/28/14), TIA, PVD s/p stent to L SFA 5/11 and R SFA PTA 6/11, HLD, COPD, tobacco abuse, atrial fib/flutter on amio and Eliquis who presented to [facility] on 06/22/14 with n/v and dizziness and found to be dehydrated with orthostatic hypotension and atrial flutter with RVR.    In January of this year, the patient was admitted to [facility] with a NSTEMI, atrial fibrillation with RVR. At that time, catheterization was performed and showed a critical left main, LAD, and CX lesions with a left dominant system. S-OM was occluded and L-LAD was atretic. He developed cardiogenic shock requiring balloon pump and IV pressor support. He also suffered recurrent V- Fib, with CPR and multiple defibrillations performed. He required ventilator support for respiratory failure. He underwent partially successful PTCA of the distal left main and ostial circumflex. During that hospitalization, a TCTS consult was obtained for consideration of redo CABG, but patient was not felt to be a candidate at that time due to deconditioning.  He was discharged to a nursing home on 05/14/2014. He initially did okay, however, he presented back to [facility] with recurrent chest pain. Admitted 2/11-2/27. He was again found to have a mildly elevated troponin and to be in atrial flutter. He converted to NSR with Amiodarone and was seen by Dr. Servando Snare. Decision was made to pursue re-do CABG (RIMA-Intermediate, S-OM, S-PDA, S-dist LAD). Postop course was notable for recurrent AFib/Flutter and was continued on Amiodarone. He was placed on Eliquis for stroke prophylaxis. He was DC to SNF for rehab. He was seen for follow up on 06/17/14 with Richardson Dopp, PAC who increased his Amio to 400 BID, because of uncontrolled HR while in flutter, and switched the lasix to 20mg  daily. Plan was for DCCV in 4 weeks. The patient reports on Friday prior to being discharged from rehabilitation he had turned around and fell down. He hit his head and was seen at [facility]. There is no acute injury seen on head CT. Since his discharge however, he's been getting progressively weaker. He noted dizziness that is worse upon changing position. On the day of admission he developed nausea and vomiting.He also reported drinking large amounts of caffeinated coffee and minimal amounts of fluids.  Hospital Course  Atypical atrial flutter:  now maintaining NSR after DCCV on amiodarone -- He has been on anticoagulation with Eliquis since 06/04/14, but it was felt that he needed cardioversion while inpatient so he underwent TEE/DCCV on 06/24/14 -- Amiodarone was decreased from 400 mg BID to 200mg  qd after DCCV. Continue metoprolol 25mg  BID and Eliquis  Dehydration- He was recently started on  Lasix 40, then decreased to 20 mg po daily for LE edema, he is drinking excessive amount of coffee and minimal amount of fluids. Also suspect high dose of amiodarone 400mg  BID started in office recently was contributing to his N/V. He is feeling much better since decreasing  dose. -- His lasix was discontinued on admission and he was given gentle IVFs. Will resume home dose at discharge.  Orthostatic hypotension- found to be orthostatic on admission. This improved with IVFs and holding lasix  Hypokalemia/hyponatremia- now resolved  CAD- s/p recent redo CABG x 4 (RIMA-Int, SVG- disal LAD, SVG-OM, SVG-PD) on 6/62/94 complicated by recurrent atrial fib/flutter.  -- Troponin neg x2. No objective signs of ischemia. -- No ASA as he is on Eliquis. His plavix was discontinued by Dr. Tamala Julian during this admission. Continue BB and statin.  Ischemic cardiomyopathy/chronic systolic CHF: TEE on this admission with EF 45% -- Continue beta-blocker. Consider ACE inhibitor at FU if BP and renal function stable.  -- Lasix held due to dehydration. Will resume home dose of 20mg  Lasix qd.  Hyperlipidemia: Continue statin.  GERD- Continue Protonix 40 mg Once daily.   Recent fall- also fell the last week and had a head CT done in [facility] ER, that shoed no hematoma and no intracranial bleed.   The patient has had an uncomplicated hospital course and is recovering well. He has been seen by Dr. Tamala Julian today and deemed ready for discharge home. All follow-up appointments have been scheduled. Smoking cessation was disscussed in length. Discharge medications are listed below.   Discharge Vitals: Blood pressure 120/55, pulse 70, temperature 97.4 F (36.3 C), temperature source Oral, resp. rate 18, height 5\' 11"  (1.803 m), weight 153 lb 3.2 oz (69.491 kg), SpO2 94 %.  Labs: Lab Results  Component Value Date   WBC 6.6 06/22/2014   HGB 11.2* 06/22/2014   HCT 33.5* 06/22/2014   MCV 93.3 06/22/2014   PLT 357 06/22/2014     Recent Labs Lab 06/22/14 1945 06/23/14 0555  NA  --  135  K  --  3.9  CL  --  101  CO2  --  27  BUN  --  8  CREATININE  --  0.89  CALCIUM  --  9.0  PROT 5.4*  --   BILITOT 0.6  --   ALKPHOS 82  --   ALT 30  --   AST 21  --   GLUCOSE  --  98   No  results for input(s): CKTOTAL, CKMB, TROPONINI in the last 72 hours. Lab Results  Component Value Date   CHOL 146 11/19/2012   HDL 38.50* 11/19/2012   LDLCALC 89 11/19/2012   TRIG 93.0 11/19/2012   No results found for: DDIMER  Diagnostic Studies/Procedures   Dg Chest 2 View  06/22/2014   CLINICAL DATA:  Vomiting and left arm pain since 1500 hours today. CABG performed 05/28/2014.  EXAM: CHEST  2 VIEW  COMPARISON:  05/31/2014  FINDINGS: Status post median sternotomy and CABG. Heart is normal in size. Aeration at the lung bases has improved. There is minimal persistent left lower lobe atelectasis or scarring. Persistent small right-sided pleural effusion. There has been some improvement in the small left pleural effusion. No pneumothorax or pulmonary edema.  No free intraperitoneal air.  IMPRESSION: 1. Persistent minimal left lower lobe atelectasis. 2. Interval improvement and bibasilar aeration. 3. Persistent small right-sided pleural effusion.   Electronically Signed   By: Nolon Nations M.D.   On:  06/22/2014 18:46   Dg Chest 2 View  05/31/2014   CLINICAL DATA:  Coronary artery disease post CABG, history COPD, MI, hyperlipidemia, smoker, prostate cancer  EXAM: CHEST  2 VIEW  COMPARISON:  05/30/2014  FINDINGS: Interval removal of RIGHT jugular line.  Upper normal heart size post CABG.  Atherosclerotic calcification aorta.  Mediastinal contours and pulmonary vascularity normal.  Emphysematous changes with bibasilar atelectasis and small pleural effusions.  Remaining lungs clear.  No pneumothorax or acute osseous findings.  IMPRESSION: Post CABG.  COPD changes with bibasilar atelectasis and small pleural effusions.   Electronically Signed   By: Lavonia Dana M.D.   On: 05/31/2014 08:07   Ct Head Wo Contrast  06/18/2014   CLINICAL DATA:  74 year old male who fell with posterior head injury. Hematoma. Initial encounter.  EXAM: CT HEAD WITHOUT CONTRAST  TECHNIQUE: Contiguous axial images were obtained  from the base of the skull through the vertex without intravenous contrast.  COMPARISON:  Brain MRI 04/21/2009.  FINDINGS: No focal scalp hematoma identified. No acute orbits soft tissue finding. Mild ethmoid sinus mucosal thickening. Other Visualized paranasal sinuses and mastoids are clear. Calvarium intact.  Calcified atherosclerosis at the skull base. Cerebral volume is stable and within normal limits for age. No midline shift, ventriculomegaly, mass effect, evidence of mass lesion, intracranial hemorrhage or evidence of cortically based acute infarction. Gray-white matter differentiation is within normal limits throughout the brain. No suspicious intracranial vascular hyperdensity.  IMPRESSION: Normal for age non contrast CT appearance of the brain. No acute traumatic injury identified.   Electronically Signed   By: Genevie Ann M.D.   On: 06/18/2014 16:52   Dg Chest Port 1 View  05/30/2014   CLINICAL DATA:  Status post CABG x3  EXAM: PORTABLE CHEST - 1 VIEW  COMPARISON:  05/29/2014  FINDINGS: Swan-Ganz catheter is been removed. The right jugular sheath remains. The right chest tube has been removed. Mediastinal drain is been removed. No pneumothorax is evident. Mild atelectatic appearing basilar opacities persist bilaterally.  IMPRESSION: Removal of mediastinal drain, right chest tube and SG catheter. Persistent mild atelectatic opacities   Electronically Signed   By: Andreas Newport M.D.   On: 05/30/2014 05:44   Dg Chest Port 1 View  05/29/2014   CLINICAL DATA:  CABG x4  EXAM: PORTABLE CHEST - 1 VIEW  COMPARISON:  05/28/2014  FINDINGS: There is a right chest tube appearing unchanged in position. There is a right jugular Swan-Ganz catheter extending well out the right lower lobe artery. Mediastinal drain noted. Endotracheal tube and nasogastric tube have been removed. Left chest tube has been removed. No pneumothorax is evident. Mild atelectatic appearing basilar opacities have partially cleared.   IMPRESSION: Removal of the endotracheal tube, nasogastric tube and left chest tube. Swan-Ganz catheter extends far out the right lower lobe pulmonary artery. Partial clearance of basilar opacities.   Electronically Signed   By: Andreas Newport M.D.   On: 05/29/2014 05:42   Dg Chest Port 1 View  05/28/2014   CLINICAL DATA:  Status post coronary artery bypass grafting. Hypoxia.  EXAM: PORTABLE CHEST - 1 VIEW  COMPARISON:  May 20, 2014  FINDINGS: Patient is status post coronary artery bypass grafting. Endotracheal tube tip is 6.6 cm above the carina. The Swan-Ganz catheter tip is in the midportion of the right interlobar pulmonary artery. The tip is approximately 5 cm distal to the main right pulmonary artery. Nasogastric tube tip and side port are in the stomach. There is a  chest tube on the right. Temporary pacemaker wires are attached to the right heart. No pneumothorax.  There is left lower lobe consolidation with small left effusion. There is subsegmental atelectasis in the right base. Heart size is normal. Pulmonary vascularity is normal. There is atherosclerotic change in the aorta.  IMPRESSION: Tube and catheter positions as described without pneumothorax. Note that the Swan-Ganz catheter tip is well into the right interlobar pulmonary artery with the tip approximately 5 cm distal to the right main pulmonary artery.  There is left lower lobe consolidation with small left effusion. There is mild right base atelectasis.  Heart size is within normal limits.   Electronically Signed   By: Lowella Grip III M.D.   On: 05/28/2014 16:05     TEE/CARDIOVERSION NOTE  TRANSESOPHAGEAL ECHOCARDIOGRAM (TEE):  Indictation: Atrial Fibrillation Findings:  1. LEFT VENTRICLE: The left ventricular wall thickness is mildly increased, except for the inferior wall, which is thin. The left ventricular cavity is normal in size. Wall motion is notable for inferoseptal hypokinesis. LVEF is 45%.  2. RIGHT  VENTRICLE: The right ventricle is normal in structure and function without any thrombus or masses.  3. LEFT ATRIUM: The left atrium is dilated in size without any thrombus or masses. There is not spontaneous echo contrast ("smoke") in the left atrium consistent with a low flow state.  4. LEFT ATRIAL APPENDAGE: The left atrial appendage has prominent pectinate muscles, but is free of any thrombus or masses. The appendage has single lobes. Pulse doppler indicates moderate flow in the appendage.  5. ATRIAL SEPTUM: The atrial septum appears intact and is free of thrombus and/or masses. There is no evidence for interatrial shunting by color doppler and saline microbubble.  6. RIGHT ATRIUM: The right atrium is normal in size and function without any thrombus or masses.  7. MITRAL VALVE: The mitral valve is normal in structure and function with Mild regurgitation with several small regurgitant jets There were no vegetations or stenosis.  8. AORTIC VALVE: The aortic valve is trileaflet and mildly sclerotic, with no regurgitation. There were no vegetations or stenosis  9. TRICUSPID VALVE: The tricuspid valve is normal in structure and function with Mild regurgitation. There were no vegetations or stenosis  10. PULMONIC VALVE: The pulmonic valve is normal in structure and function with Mild regurgitation. There were no vegetations or stenosis.  11. AORTIC ARCH, ASCENDING AND DESCENDING AORTA: There was grade 1 Ron Parker et. Al, 1992) atherosclerosis of the proximal descending aorta.  12. PULMONARY VEINS: Anomalous pulmonary venous return was not noted.  13. PERICARDIUM: The pericardium appeared normal and non-thickened. There is no pericardial effusion.   Discharge Medications     Medication List    STOP taking these medications        clopidogrel 75 MG tablet  Commonly known as:  PLAVIX      TAKE these medications        amiodarone 200 MG tablet   Commonly known as:  PACERONE  Take 1 tablet (200 mg total) by mouth daily.  Start taking on:  06/26/2014     apixaban 5 MG Tabs tablet  Commonly known as:  ELIQUIS  Take 1 tablet (5 mg total) by mouth 2 (two) times daily.     atorvastatin 40 MG tablet  Commonly known as:  LIPITOR  Take 1 tablet (40 mg total) by mouth at bedtime.     bisacodyl 10 MG suppository  Commonly known as:  DULCOLAX  Place 1 suppository (  10 mg total) rectally daily as needed for moderate constipation.     feeding supplement (ENSURE COMPLETE) Liqd  Take 237 mLs by mouth 2 (two) times daily between meals.     ferrous gluconate 324 MG tablet  Commonly known as:  FERGON  Take 1 tablet (324 mg total) by mouth 2 (two) times daily with a meal.     fluticasone 50 MCG/ACT nasal spray  Commonly known as:  FLONASE  Place 1 spray into both nostrils as needed for allergies.     folic acid 1 MG tablet  Commonly known as:  FOLVITE  Take 1 tablet (1 mg total) by mouth daily.     furosemide 20 MG tablet  Commonly known as:  LASIX  Take 1 tablet (20 mg total) by mouth daily.     loratadine 10 MG tablet  Commonly known as:  CLARITIN  Take 10 mg by mouth daily as needed for allergies.     metoprolol tartrate 25 MG tablet  Commonly known as:  LOPRESSOR  Take 1 tablet (25 mg total) by mouth 2 (two) times daily.     nitroGLYCERIN 0.4 MG SL tablet  Commonly known as:  NITROSTAT  Place 0.4 mg under the tongue every 5 (five) minutes as needed for chest pain (MAX 3 TABLETS).     pantoprazole 40 MG tablet  Commonly known as:  PROTONIX  Take 1 tablet (40 mg total) by mouth daily.     traMADol 50 MG tablet  Commonly known as:  ULTRAM  Take 50-100 mg by mouth every 6 (six) hours as needed for moderate pain or severe pain.        Disposition   The patient will be discharged in stable condition to home.  Follow-up Information    Follow up with Lauree Chandler, MD On 07/05/2014.   Specialty:  Cardiology    Why:  @ 8:45am   Contact information:   [place]. [address] [place] [address] [phone]         Duration of Discharge Encounter: Greater than 30 minutes including physician and PA time.  Audrie Gallus, Nick Stults R PA-C 06/25/2014, 2:03 PM

## 2014-06-25 NOTE — Progress Notes (Addendum)
       Patient Name: [name] Date of Encounter: 06/25/2014    SUBJECTIVE: No complaints.   TELEMETRY:  Maintaining sinus rhythm Filed Vitals:   06/24/14 1500 06/24/14 1523 06/24/14 2157 06/25/14 0447  BP: 101/51 108/51 112/47 120/55  Pulse: 59 65 65 70  Temp:   97.8 F (36.6 C) 97.4 F (36.3 C)  TempSrc:   Oral Oral  Resp: 18 18 20 18   Height:      Weight:   153 lb 3.2 oz (69.491 kg)   SpO2: 100% 99% 94% 94%    Intake/Output Summary (Last 24 hours) at 06/25/14 1036 Last data filed at 06/25/14 0448  Gross per 24 hour  Intake   1020 ml  Output      0 ml  Net   1020 ml   LABS: Basic Metabolic Panel:  Recent Labs  06/22/14 1731 06/23/14 0555  NA 129* 135  K 3.5 3.9  CL 93* 101  CO2 25 27  GLUCOSE 123* 98  BUN 11 8  CREATININE 0.96 0.89  CALCIUM 9.1 9.0   CBC:  Recent Labs  06/22/14 1731  WBC 6.6  HGB 11.2*  HCT 33.5*  MCV 93.3  PLT 357    Radiology/Studies:  No new data  Physical Exam: Blood pressure 120/55, pulse 70, temperature 97.4 F (36.3 C), temperature source Oral, resp. rate 18, height 5\' 11"  (1.803 m), weight 153 lb 3.2 oz (69.491 kg), SpO2 94 %. Weight change:   Wt Readings from Last 3 Encounters:  06/24/14 153 lb 3.2 oz (69.491 kg)  06/17/14 156 lb (70.761 kg)  06/05/14 161 lb 9.6 oz (73.301 kg)   Lying flat Chest clear Regular rate and rhythm  ASSESSMENT:  1. Atrial flutter maintaining NDR after cardioversion on amiodarone. 2. Nausea resolved 3. CAD stable and s/p 4 vessel CABG, all vein. 4. Chronic anticoagulation  Plan:  Discharge home today Does not need plavix, just Eliquis C. McAlhany is primary cardiologist Decrease Amiodarone to 200 mg daily  Signed, Sinclair Grooms 06/25/2014, 10:36 AM

## 2014-06-26 DIAGNOSIS — I4891 Unspecified atrial fibrillation: Secondary | ICD-10-CM | POA: Diagnosis not present

## 2014-06-26 DIAGNOSIS — Z9181 History of falling: Secondary | ICD-10-CM | POA: Diagnosis not present

## 2014-06-26 DIAGNOSIS — Z48812 Encounter for surgical aftercare following surgery on the circulatory system: Secondary | ICD-10-CM | POA: Diagnosis not present

## 2014-06-26 DIAGNOSIS — I213 ST elevation (STEMI) myocardial infarction of unspecified site: Secondary | ICD-10-CM | POA: Diagnosis not present

## 2014-06-26 DIAGNOSIS — Z8673 Personal history of transient ischemic attack (TIA), and cerebral infarction without residual deficits: Secondary | ICD-10-CM | POA: Diagnosis not present

## 2014-06-26 DIAGNOSIS — I2510 Atherosclerotic heart disease of native coronary artery without angina pectoris: Secondary | ICD-10-CM | POA: Diagnosis not present

## 2014-06-28 ENCOUNTER — Other Ambulatory Visit: Payer: Self-pay | Admitting: Cardiovascular Disease

## 2014-06-28 DIAGNOSIS — Z9181 History of falling: Secondary | ICD-10-CM | POA: Diagnosis not present

## 2014-06-28 DIAGNOSIS — Z48812 Encounter for surgical aftercare following surgery on the circulatory system: Secondary | ICD-10-CM | POA: Diagnosis not present

## 2014-06-28 DIAGNOSIS — I4891 Unspecified atrial fibrillation: Secondary | ICD-10-CM | POA: Diagnosis not present

## 2014-06-28 DIAGNOSIS — Z8673 Personal history of transient ischemic attack (TIA), and cerebral infarction without residual deficits: Secondary | ICD-10-CM | POA: Diagnosis not present

## 2014-06-28 DIAGNOSIS — I213 ST elevation (STEMI) myocardial infarction of unspecified site: Secondary | ICD-10-CM | POA: Diagnosis not present

## 2014-06-28 DIAGNOSIS — I2510 Atherosclerotic heart disease of native coronary artery without angina pectoris: Secondary | ICD-10-CM | POA: Diagnosis not present

## 2014-06-28 NOTE — Care Management Note (Signed)
Late Entry:  CARE MANAGEMENT NOTE 06/28/2014  Patient:  [name], [name]   Account Number:  [id number]  Date Initiated:  06/28/2014  Documentation initiated by:  Jenasis Straley  Subjective/Objective Assessment:   CM followed for progression and d/c planning.     Action/Plan:   Pt active with [facility], and that agency is following this pt. Will send d/c summary to [place].   Anticipated DC Date:  06/25/2014   Anticipated DC Plan:  [place]         Choice offered to / List presented to:          [facility] arranged  HH-1 RN  [place]   Status of service:  Completed, signed off Medicare Important Message given?  NO (If response is "NO", the following Medicare IM given date fields will be blank) Date Medicare IM given:   Medicare IM given by:   Date Additional Medicare IM given:   Additional Medicare IM given by:    Discharge Disposition:  [place]  Per UR Regulation:    If discussed at Long Length of Stay Meetings, dates discussed:    Comments:

## 2014-06-29 ENCOUNTER — Telehealth: Payer: Self-pay | Admitting: *Deleted

## 2014-06-29 ENCOUNTER — Ambulatory Visit (INDEPENDENT_AMBULATORY_CARE_PROVIDER_SITE_OTHER): Payer: Medicare Other | Admitting: Internal Medicine

## 2014-06-29 ENCOUNTER — Other Ambulatory Visit (INDEPENDENT_AMBULATORY_CARE_PROVIDER_SITE_OTHER): Payer: Medicare Other

## 2014-06-29 ENCOUNTER — Ambulatory Visit: Payer: Self-pay | Admitting: Internal Medicine

## 2014-06-29 ENCOUNTER — Encounter: Payer: Self-pay | Admitting: Internal Medicine

## 2014-06-29 VITALS — BP 100/60 | HR 106 | Wt 153.0 lb

## 2014-06-29 DIAGNOSIS — E871 Hypo-osmolality and hyponatremia: Secondary | ICD-10-CM

## 2014-06-29 DIAGNOSIS — E876 Hypokalemia: Secondary | ICD-10-CM

## 2014-06-29 DIAGNOSIS — I2510 Atherosclerotic heart disease of native coronary artery without angina pectoris: Secondary | ICD-10-CM | POA: Diagnosis not present

## 2014-06-29 DIAGNOSIS — I4892 Unspecified atrial flutter: Secondary | ICD-10-CM

## 2014-06-29 DIAGNOSIS — I4891 Unspecified atrial fibrillation: Secondary | ICD-10-CM | POA: Diagnosis not present

## 2014-06-29 DIAGNOSIS — Z7901 Long term (current) use of anticoagulants: Secondary | ICD-10-CM

## 2014-06-29 DIAGNOSIS — Z720 Tobacco use: Secondary | ICD-10-CM

## 2014-06-29 DIAGNOSIS — Z48812 Encounter for surgical aftercare following surgery on the circulatory system: Secondary | ICD-10-CM | POA: Diagnosis not present

## 2014-06-29 DIAGNOSIS — I213 ST elevation (STEMI) myocardial infarction of unspecified site: Secondary | ICD-10-CM | POA: Diagnosis not present

## 2014-06-29 DIAGNOSIS — F17209 Nicotine dependence, unspecified, with unspecified nicotine-induced disorders: Secondary | ICD-10-CM

## 2014-06-29 DIAGNOSIS — Z9181 History of falling: Secondary | ICD-10-CM | POA: Diagnosis not present

## 2014-06-29 DIAGNOSIS — Z8673 Personal history of transient ischemic attack (TIA), and cerebral infarction without residual deficits: Secondary | ICD-10-CM | POA: Diagnosis not present

## 2014-06-29 LAB — BASIC METABOLIC PANEL
BUN: 15 mg/dL (ref 6–23)
CO2: 30 mEq/L (ref 19–32)
Calcium: 9.4 mg/dL (ref 8.4–10.5)
Chloride: 96 mEq/L (ref 96–112)
Creatinine, Ser: 1 mg/dL (ref 0.40–1.50)
GFR: 77.32 mL/min (ref >60.00)
GLUCOSE: 144 mg/dL — AB (ref 70–99)
Potassium: 3.3 mEq/L — ABNORMAL LOW (ref 3.5–5.1)
Sodium: 133 mEq/L — ABNORMAL LOW (ref 135–145)

## 2014-06-29 LAB — CBC WITH DIFFERENTIAL/PLATELET
Basophils Absolute: 0.1 10*3/uL (ref 0.0–0.1)
Basophils Relative: 1.1 % (ref 0.0–3.0)
EOS PCT: 6.6 % — AB (ref 0.0–5.0)
Eosinophils Absolute: 0.5 10*3/uL (ref 0.0–0.7)
HEMATOCRIT: 37.6 % — AB (ref 39.0–52.0)
Hemoglobin: 12.6 g/dL — ABNORMAL LOW (ref 13.0–17.0)
Lymphocytes Relative: 13.6 % (ref 12.0–46.0)
Lymphs Abs: 1.1 10*3/uL (ref 0.7–4.0)
MCHC: 33.5 g/dL (ref 30.0–36.0)
MCV: 92.7 fl (ref 78.0–100.0)
MONOS PCT: 10.4 % (ref 3.0–12.0)
Monocytes Absolute: 0.8 10*3/uL (ref 0.1–1.0)
NEUTROS PCT: 68.3 % (ref 43.0–77.0)
Neutro Abs: 5.3 10*3/uL (ref 1.4–7.7)
Platelets: 445 10*3/uL — ABNORMAL HIGH (ref 150.0–400.0)
RBC: 4.06 Mil/uL — ABNORMAL LOW (ref 4.22–5.81)
RDW: 16.6 % — ABNORMAL HIGH (ref 11.5–15.5)
WBC: 7.8 10*3/uL (ref 4.0–10.5)

## 2014-06-29 MED ORDER — POTASSIUM CHLORIDE ER 8 MEQ PO TBCR: 8.0000 meq | EXTENDED_RELEASE_TABLET | Freq: Every day | ORAL | Status: DC

## 2014-06-29 NOTE — Progress Notes (Signed)
Subjective:    HPI  Post-hosp F/u:  Admission Dates: 06/22/14-06/25/14 Discharge Diagnosis: Atrial flutter maintaining NSR after cardioversion on amiodarone.  HPI: Mr. Viglione is a 74 year old gentleman with a history of CAD s/p CABG x2 (1993) and redo CABG x4V (05/28/14), TIA, PVD s/p stent to L SFA 5/11 and R SFA PTA 6/11, HLD, COPD, tobacco abuse, atrial fib/flutter on amio and Eliquis who presented to [facility] on 06/22/14 with n/v and dizziness and found to be dehydrated with orthostatic hypotension and atrial flutter with RVR.   In January of this year, the patient was admitted to [facility] with a NSTEMI, atrial fibrillation with RVR. At that time, catheterization was performed and showed a critical left main, LAD, and CX lesions with a left dominant system. S-OM was occluded and L-LAD was atretic. He developed cardiogenic shock requiring balloon pump and IV pressor support. He also suffered recurrent V- Fib, with CPR and multiple defibrillations performed. He required ventilator support for respiratory failure. He underwent partially successful PTCA of the distal left main and ostial circumflex. During that hospitalization, a TCTS consult was obtained for consideration of redo CABG, but patient was not felt to be a candidate at that time due to deconditioning. He was discharged to a nursing home on 05/14/2014. He initially did okay, however, he presented back to [facility] with recurrent chest pain. Admitted 2/11-2/27. He was again found to have a mildly elevated troponin and to be in atrial flutter. He converted to NSR with Amiodarone and was seen by Dr. Servando Snare. Decision was made to pursue re-do CABG (RIMA-Intermediate, S-OM, S-PDA, S-dist LAD). Postop course was notable for recurrent AFib/Flutter and was continued on Amiodarone. He was placed on Eliquis for stroke prophylaxis. He was DC to SNF for rehab. He was seen for follow up on 06/17/14 with Richardson Dopp,  PAC who increased his Amio to 400 BID, because of uncontrolled HR while in flutter, and switched the lasix to 20mg  daily. Plan was for DCCV in 4 weeks. The patient reports on Friday prior to being discharged from rehabilitation he had turned around and fell down. He hit his head and was seen at [facility]. There is no acute injury seen on head CT. Since his discharge however, he's been getting progressively weaker. He noted dizziness that is worse upon changing position. On the day of admission he developed nausea and vomiting.He also reported drinking large amounts of caffeinated coffee and minimal amounts of fluids.  Hospital Course  Atypical atrial flutter: now maintaining NSR after DCCV on amiodarone -- He has been on anticoagulation with Eliquis since 06/04/14, but it was felt that he needed cardioversion while inpatient so he underwent TEE/DCCV on 06/24/14 -- Amiodarone was decreased from 400 mg BID to 200mg  qd after DCCV. Continue metoprolol 25mg  BID and Eliquis  Dehydration- He was recently started on Lasix 40, then decreased to 20 mg po daily for LE edema, he is drinking excessive amount of coffee and minimal amount of fluids. Also suspect high dose of amiodarone 400mg  BID started in office recently was contributing to his N/V. He is feeling much better since decreasing dose. -- His lasix was discontinued on admission and he was given gentle IVFs. Will resume home dose at discharge.  Orthostatic hypotension- found to be orthostatic on admission. This improved with IVFs and holding lasix  Hypokalemia/hyponatremia- now resolved  CAD- s/p recent redo CABG x 4 (RIMA-Int, SVG- disal LAD, SVG-OM, SVG-PD) on 1/95/09 complicated by recurrent atrial fib/flutter.  --  Troponin neg x2. No objective signs of ischemia. -- No ASA as he is on Eliquis. His plavix was discontinued by Dr. Tamala Julian during this admission. Continue BB and statin.  Ischemic cardiomyopathy/chronic systolic CHF: TEE on this  admission with EF 45% -- Continue beta-blocker. Consider ACE inhibitor at FU if BP and renal function stable.  -- Lasix held due to dehydration. Will resume home dose of 20mg  Lasix qd.  Hyperlipidemia: Continue statin.  GERD- Continue Protonix 40 mg Once daily.   Recent fall- also fell the last week and had a head CT done in [facility] ER, that shoed no hematoma and no intracranial bleed.   The patient has had an uncomplicated hospital course and is recovering well. He has been seen by Dr. Tamala Julian today and deemed ready for discharge home. All follow-up appointments have been scheduled. Smoking cessation was disscussed in length. Discharge medications are listed below.   Discharge Vitals: Blood pressure 120/55, pulse 70, temperature 97.4 F (36.3 C), temperature source Oral, resp. rate 18, height 5\' 11"  (1.803 m), weight 153 lb 3.2 oz (69.491 kg), SpO2 94 %.  Labs:  Recent Labs    Lab Results  Component Value Date   WBC 6.6 06/22/2014   HGB 11.2* 06/22/2014   HCT 33.5* 06/22/2014   MCV 93.3 06/22/2014   PLT 357 06/22/2014       Last Labs      Recent Labs Lab 06/22/14 1945 06/23/14 0555  NA --  135  K --  3.9  CL --  101  CO2 --  27  BUN --  8  CREATININE --  0.89  CALCIUM --  9.0  PROT 5.4* --   BILITOT 0.6 --   ALKPHOS 82 --   ALT 30 --   AST 21 --   GLUCOSE --  98      Recent Labs (last 2 labs)     No results for input(s): CKTOTAL, CKMB, TROPONINI in the last 72 hours.    Recent Labs    Lab Results  Component Value Date   CHOL 146 11/19/2012   HDL 38.50* 11/19/2012   LDLCALC 89 11/19/2012   TRIG 93.0 11/19/2012      Recent Labs    No results found for: DDIMER    Diagnostic Studies/Procedures    Imaging Results    Dg Chest 2 View  06/22/2014 CLINICAL DATA: Vomiting and left arm pain since 1500 hours today. CABG performed 05/28/2014. EXAM: CHEST 2  VIEW COMPARISON: 05/31/2014 FINDINGS: Status post median sternotomy and CABG. Heart is normal in size. Aeration at the lung bases has improved. There is minimal persistent left lower lobe atelectasis or scarring. Persistent small right-sided pleural effusion. There has been some improvement in the small left pleural effusion. No pneumothorax or pulmonary edema. No free intraperitoneal air. IMPRESSION: 1. Persistent minimal left lower lobe atelectasis. 2. Interval improvement and bibasilar aeration. 3. Persistent small right-sided pleural effusion. Electronically Signed By: Nolon Nations M.D. On: 06/22/2014 18:46   Dg Chest 2 View  05/31/2014 CLINICAL DATA: Coronary artery disease post CABG, history COPD, MI, hyperlipidemia, smoker, prostate cancer EXAM: CHEST 2 VIEW COMPARISON: 05/30/2014 FINDINGS: Interval removal of RIGHT jugular line. Upper normal heart size post CABG. Atherosclerotic calcification aorta. Mediastinal contours and pulmonary vascularity normal. Emphysematous changes with bibasilar atelectasis and small pleural effusions. Remaining lungs clear. No pneumothorax or acute osseous findings. IMPRESSION: Post CABG. COPD changes with bibasilar atelectasis and small pleural effusions. Electronically Signed By: Lavonia Dana M.D. On: 05/31/2014  08:07   Ct Head Wo Contrast  06/18/2014 CLINICAL DATA: 74 year old male who fell with posterior head injury. Hematoma. Initial encounter. EXAM: CT HEAD WITHOUT CONTRAST TECHNIQUE: Contiguous axial images were obtained from the base of the skull through the vertex without intravenous contrast. COMPARISON: Brain MRI 04/21/2009. FINDINGS: No focal scalp hematoma identified. No acute orbits soft tissue finding. Mild ethmoid sinus mucosal thickening. Other Visualized paranasal sinuses and mastoids are clear. Calvarium intact. Calcified atherosclerosis at the skull base. Cerebral volume is stable and within normal limits for  age. No midline shift, ventriculomegaly, mass effect, evidence of mass lesion, intracranial hemorrhage or evidence of cortically based acute infarction. Gray-white matter differentiation is within normal limits throughout the brain. No suspicious intracranial vascular hyperdensity. IMPRESSION: Normal for age non contrast CT appearance of the brain. No acute traumatic injury identified. Electronically Signed By: Genevie Ann M.D. On: 06/18/2014 16:52   Dg Chest Port 1 View  05/30/2014 CLINICAL DATA: Status post CABG x3 EXAM: PORTABLE CHEST - 1 VIEW COMPARISON: 05/29/2014 FINDINGS: Swan-Ganz catheter is been removed. The right jugular sheath remains. The right chest tube has been removed. Mediastinal drain is been removed. No pneumothorax is evident. Mild atelectatic appearing basilar opacities persist bilaterally. IMPRESSION: Removal of mediastinal drain, right chest tube and SG catheter. Persistent mild atelectatic opacities Electronically Signed By: Andreas Newport M.D. On: 05/30/2014 05:44   Dg Chest Port 1 View  05/29/2014 CLINICAL DATA: CABG x4 EXAM: PORTABLE CHEST - 1 VIEW COMPARISON: 05/28/2014 FINDINGS: There is a right chest tube appearing unchanged in position. There is a right jugular Swan-Ganz catheter extending well out the right lower lobe artery. Mediastinal drain noted. Endotracheal tube and nasogastric tube have been removed. Left chest tube has been removed. No pneumothorax is evident. Mild atelectatic appearing basilar opacities have partially cleared. IMPRESSION: Removal of the endotracheal tube, nasogastric tube and left chest tube. Swan-Ganz catheter extends far out the right lower lobe pulmonary artery. Partial clearance of basilar opacities. Electronically Signed By: Andreas Newport M.D. On: 05/29/2014 05:42   Dg Chest Port 1 View  05/28/2014 CLINICAL DATA: Status post coronary artery bypass grafting. Hypoxia. EXAM: PORTABLE CHEST - 1 VIEW  COMPARISON: May 20, 2014 FINDINGS: Patient is status post coronary artery bypass grafting. Endotracheal tube tip is 6.6 cm above the carina. The Swan-Ganz catheter tip is in the midportion of the right interlobar pulmonary artery. The tip is approximately 5 cm distal to the main right pulmonary artery. Nasogastric tube tip and side port are in the stomach. There is a chest tube on the right. Temporary pacemaker wires are attached to the right heart. No pneumothorax. There is left lower lobe consolidation with small left effusion. There is subsegmental atelectasis in the right base. Heart size is normal. Pulmonary vascularity is normal. There is atherosclerotic change in the aorta. IMPRESSION: Tube and catheter positions as described without pneumothorax. Note that the Swan-Ganz catheter tip is well into the right interlobar pulmonary artery with the tip approximately 5 cm distal to the right main pulmonary artery. There is left lower lobe consolidation with small left effusion. There is mild right base atelectasis. Heart size is within normal limits. Electronically Signed By: Lowella Grip III M.D. On: 05/28/2014 16:05      TEE/CARDIOVERSION NOTE  TRANSESOPHAGEAL ECHOCARDIOGRAM (TEE):  Indictation: Atrial Fibrillation Findings:  1. LEFT VENTRICLE: The left ventricular wall thickness is mildly increased, except for the inferior wall, which is thin. The left ventricular cavity is normal in size. Wall motion is notable  for inferoseptal hypokinesis. LVEF is 45%.  2. RIGHT VENTRICLE: The right ventricle is normal in structure and function without any thrombus or masses.  3. LEFT ATRIUM: The left atrium is dilated in size without any thrombus or masses. There is not spontaneous echo contrast ("smoke") in the left atrium consistent with a low flow state.  4. LEFT ATRIAL APPENDAGE: The left atrial appendage has prominent pectinate muscles, but is free of any thrombus or  masses. The appendage has single lobes. Pulse doppler indicates moderate flow in the appendage.  5. ATRIAL SEPTUM: The atrial septum appears intact and is free of thrombus and/or masses. There is no evidence for interatrial shunting by color doppler and saline microbubble.  6. RIGHT ATRIUM: The right atrium is normal in size and function without any thrombus or masses.  7. MITRAL VALVE: The mitral valve is normal in structure and function with Mild regurgitation with several small regurgitant jets There were no vegetations or stenosis.  8. AORTIC VALVE: The aortic valve is trileaflet and mildly sclerotic, with no regurgitation. There were no vegetations or stenosis  9. TRICUSPID VALVE: The tricuspid valve is normal in structure and function with Mild regurgitation. There were no vegetations or stenosis  10. PULMONIC VALVE: The pulmonic valve is normal in structure and function with Mild regurgitation. There were no vegetations or stenosis.  11. AORTIC ARCH, ASCENDING AND DESCENDING AORTA: There was grade 1 Ron Parker et. Al, 1992) atherosclerosis of the proximal descending aorta.  12. PULMONARY VEINS: Anomalous pulmonary venous return was not noted.  13. PERICARDIUM: The pericardium appeared normal and non-thickened. There is no pericardial effusion.           The patient presents for a follow-up of  chronic hypertension, chronic dyslipidemia, PVD, COPD controlled with medicines. F/u pain in LS spine, legs 6-7/10 worse lately F/u rash on B palms and soles, worse x wks F/u skin lesions   Wt Readings from Last 3 Encounters:  06/29/14 153 lb (69.4 kg)  06/24/14 153 lb 3.2 oz (69.491 kg)  06/17/14 156 lb (70.761 kg)   BP Readings from Last 3 Encounters:  06/29/14 100/60  06/25/14 120/55  06/17/14 120/70     Review of Systems  Constitutional: Negative for appetite change, fatigue and unexpected weight change.  HENT: Positive for congestion,  nosebleeds and rhinorrhea. Negative for sneezing, sore throat and trouble swallowing.   Eyes: Negative for itching and visual disturbance.  Respiratory: Positive for cough.   Cardiovascular: Negative for chest pain, palpitations and leg swelling.  Gastrointestinal: Negative for nausea, diarrhea, blood in stool and abdominal distention.  Genitourinary: Negative for frequency and hematuria.  Musculoskeletal: Positive for back pain, arthralgias and gait problem. Negative for joint swelling and neck pain.  Skin: Negative for rash.  Neurological: Negative for dizziness, tremors, speech difficulty and weakness.  Psychiatric/Behavioral: Negative for sleep disturbance, dysphoric mood and agitation. The patient is not nervous/anxious.        Objective:   Physical Exam  Constitutional: He is oriented to person, place, and time. He appears well-developed. No distress.  NAD  HENT:  Mouth/Throat: Oropharynx is clear and moist.  Eyes: Conjunctivae are normal. Pupils are equal, round, and reactive to light.  Neck: Normal range of motion. No JVD present. No thyromegaly present.  Cardiovascular: Normal rate, regular rhythm, normal heart sounds and intact distal pulses.  Exam reveals no gallop and no friction rub.   No murmur heard. Pulmonary/Chest: Effort normal and breath sounds normal. No respiratory distress. He has  no wheezes. He has no rales. He exhibits no tenderness.  Abdominal: Soft. Bowel sounds are normal. He exhibits no distension and no mass. There is no tenderness. There is no rebound and no guarding.  Musculoskeletal: Normal range of motion. He exhibits no edema or tenderness.  Lymphadenopathy:    He has no cervical adenopathy.  Neurological: He is alert and oriented to person, place, and time. He has normal reflexes. No cranial nerve deficit. He exhibits normal muscle tone. He displays a negative Romberg sign. Coordination and gait normal.  No meningeal signs  Skin: Skin is warm and dry.  No rash noted.  Psychiatric: He has a normal mood and affect. His behavior is normal. Judgment and thought content normal.  less bruises  AKs Eczema on B palms and B feet R upper back pigm growth - wart  Lab Results  Component Value Date   WBC 6.6 06/22/2014   HGB 11.2* 06/22/2014   HCT 33.5* 06/22/2014   PLT 357 06/22/2014   GLUCOSE 98 06/23/2014   CHOL 146 11/19/2012   TRIG 93.0 11/19/2012   HDL 38.50* 11/19/2012   LDLCALC 89 11/19/2012   ALT 30 06/22/2014   AST 21 06/22/2014   NA 135 06/23/2014   K 3.9 06/23/2014   CL 101 06/23/2014   CREATININE 0.89 06/23/2014   BUN 8 06/23/2014   CO2 27 06/23/2014   TSH 1.14 04/12/2011   PSA 0.01* 02/20/2012   INR 1.30 05/28/2014   HGBA1C 5.7 07/11/2006         Assessment & Plan:  klor

## 2014-06-29 NOTE — Assessment & Plan Note (Signed)
3/16 Labs

## 2014-06-29 NOTE — Telephone Encounter (Signed)
I called [facility] and gave verbal order for OT once weekly x 4 weeks.

## 2014-06-29 NOTE — Progress Notes (Signed)
Pre visit review using our clinic review tool, if applicable. No additional management support is needed unless otherwise documented below in the visit note. 

## 2014-06-29 NOTE — Assessment & Plan Note (Signed)
3/16 - smoking 1/4 PPD Discussed

## 2014-06-29 NOTE — Assessment & Plan Note (Signed)
3/16 ?due to Lasix KCl 8 meq/d Reduce Lasix to QOD

## 2014-06-29 NOTE — Assessment & Plan Note (Signed)
CBC

## 2014-06-30 DIAGNOSIS — Z8673 Personal history of transient ischemic attack (TIA), and cerebral infarction without residual deficits: Secondary | ICD-10-CM | POA: Diagnosis not present

## 2014-06-30 DIAGNOSIS — Z48812 Encounter for surgical aftercare following surgery on the circulatory system: Secondary | ICD-10-CM | POA: Diagnosis not present

## 2014-06-30 DIAGNOSIS — I213 ST elevation (STEMI) myocardial infarction of unspecified site: Secondary | ICD-10-CM | POA: Diagnosis not present

## 2014-06-30 DIAGNOSIS — Z9181 History of falling: Secondary | ICD-10-CM | POA: Diagnosis not present

## 2014-06-30 DIAGNOSIS — I2510 Atherosclerotic heart disease of native coronary artery without angina pectoris: Secondary | ICD-10-CM | POA: Diagnosis not present

## 2014-06-30 DIAGNOSIS — I4891 Unspecified atrial fibrillation: Secondary | ICD-10-CM | POA: Diagnosis not present

## 2014-06-30 NOTE — Progress Notes (Signed)
Received phone call from outpatient pharmacy with questions regarding patients discharge medications. Pharmacy given the number to contact Angelena Form, PA who initiated discharge paperwork. Dudley Major

## 2014-07-01 ENCOUNTER — Other Ambulatory Visit: Payer: Self-pay

## 2014-07-01 ENCOUNTER — Telehealth: Payer: Self-pay | Admitting: Internal Medicine

## 2014-07-01 DIAGNOSIS — I2510 Atherosclerotic heart disease of native coronary artery without angina pectoris: Secondary | ICD-10-CM

## 2014-07-01 MED ORDER — FUROSEMIDE 20 MG PO TABS: 20.0000 mg | ORAL_TABLET | Freq: Every day | ORAL | Status: DC

## 2014-07-01 MED ORDER — POTASSIUM CHLORIDE ER 8 MEQ PO TBCR: 8.0000 meq | EXTENDED_RELEASE_TABLET | Freq: Every day | ORAL | Status: DC

## 2014-07-01 NOTE — Telephone Encounter (Signed)
Pt called said that a 30 day supply needs to be called in to [facility] on [address] of potassium chloride (KLOR-CON) 8 MEQ tablet

## 2014-07-01 NOTE — Telephone Encounter (Signed)
Rf sent to [facility] for 30 day supply and [facility] for 90 day supply. Pt informed

## 2014-07-05 ENCOUNTER — Other Ambulatory Visit: Payer: Self-pay | Admitting: Cardiovascular Disease

## 2014-07-05 ENCOUNTER — Other Ambulatory Visit: Payer: Self-pay

## 2014-07-05 ENCOUNTER — Encounter: Payer: Self-pay | Admitting: Cardiovascular Disease

## 2014-07-05 ENCOUNTER — Ambulatory Visit (INDEPENDENT_AMBULATORY_CARE_PROVIDER_SITE_OTHER): Payer: Medicare Other | Admitting: Cardiovascular Disease

## 2014-07-05 VITALS — BP 150/86 | HR 80 | Ht 71.0 in | Wt 158.8 lb

## 2014-07-05 DIAGNOSIS — Z720 Tobacco use: Secondary | ICD-10-CM

## 2014-07-05 DIAGNOSIS — G4700 Insomnia, unspecified: Secondary | ICD-10-CM

## 2014-07-05 DIAGNOSIS — I2510 Atherosclerotic heart disease of native coronary artery without angina pectoris: Secondary | ICD-10-CM | POA: Diagnosis not present

## 2014-07-05 DIAGNOSIS — I255 Ischemic cardiomyopathy: Secondary | ICD-10-CM | POA: Diagnosis not present

## 2014-07-05 DIAGNOSIS — I739 Peripheral vascular disease, unspecified: Secondary | ICD-10-CM

## 2014-07-05 DIAGNOSIS — I779 Disorder of arteries and arterioles, unspecified: Secondary | ICD-10-CM

## 2014-07-05 DIAGNOSIS — I480 Paroxysmal atrial fibrillation: Secondary | ICD-10-CM

## 2014-07-05 MED ORDER — ZOLPIDEM TARTRATE 5 MG PO TABS: 5.0000 mg | ORAL_TABLET | Freq: Every evening | ORAL | Status: DC | PRN

## 2014-07-05 NOTE — Patient Instructions (Addendum)
Your physician recommends that you schedule a follow-up appointment in:  6 weeks. --Scheduled for Aug 18, 2014 at 11:30  Your physician has recommended you make the following change in your medication:  Take Ambien 5 mg by mouth daily at bedtime.

## 2014-07-05 NOTE — Progress Notes (Signed)
History of Present Illness: [name] with history of CAD s/p CABG 1993 and redo 2016, PAD, hyperlipidemia, atrial fibrillation/flutter who is here today for cardiac follow up. He has been followed in our office since 2011. I initially followed his PAD only. He has had PTA of the occluded left SFA and stent placement May 2011. I then staged intervention of his right SFA severe stenosis and treated this with a [place] atherectomy device in June 2011. He is known to have moderate carotid artery disease. His CAD remained quiet from cath in 1999 until recently. In January 2016 he was admitted to [facility] with NSTEMI and atrial fib with RVR. Cardiac cath showed a critical left main stenosis, LAD, and Circumflex lesions with a left dominant system. The vein to his OM was occluded and the LIMA to the LAD was atretic. He developed cardiogenic shock requiring balloon pump and IV pressor support. He also suffered recurrent V- Fib, with CPR and multiple defibrillations performed. He required ventilator support for respiratory failure. He underwent partially successful PTCA of the distal left main and ostial circumflex. During that hospitalization, a TCTS consult was obtained for consideration of redo CABG, but patient was not felt to be a candidate at that time due to deconditioning. He was discharged to a nursing home on 05/14/2014. He initially did okay, however, he presented back to [facility] with recurrent chest pain on 05/20/14 and was admitted 2/11-2/27/16. He was again found to have a mildly elevated troponin and to be in atrial flutter. He converted to NSR with Amiodarone and was seen by Dr. Servando Snare. Decision was made to pursue re-do CABG (RIMA-Intermediate, S-OM, S-PDA, S-dist LAD). Postop course was notable for recurrent AFib/Flutter and was continued on Amiodarone. He was placed on Eliquis for stroke prophylaxis.Readmitted 06/22/14 with weakness, atrial flutter. He underwent TEE guided  cardioversion on 06/24/14. Carotid dopplers February 2016 with mild bilateral disease. Lower ext dopplers June 2015 with widely patent left SFA stent and 50% right SFA stenosis.   He is here today for follow up. No chest pains or SOB. Still reporting some dizziness but no syncope. He has not felt his heart racing. No lower ext swelling. He continues to smoke. He is here today with his son. Energy level is improving. Daytime fatigue and reports poor sleep.   Primary Care Physician: Dr. Alain Marion.  Last Lipid Profile:Lipid Panel     Component Value Date/Time   CHOL 146 11/19/2012 0958   TRIG 93.0 11/19/2012 0958   TRIG 91 04/22/2006 0854   HDL 38.50* 11/19/2012 0958   CHOLHDL 4 11/19/2012 0958   CHOLHDL 3.6 CALC 04/22/2006 0854   VLDL 18.6 11/19/2012 0958   LDLCALC 89 11/19/2012 0958     Past Medical History  Diagnosis Date  . CAD (coronary artery disease)     CABG x 2 with LIMA to Circumflex and vein graft to PDA  07/13/1991  . History of transient ischemic attack (TIA)   . Hyperlipidemia   . Peripheral neuropathy   . PVD (peripheral vascular disease) with claudication   . Carotid bruit     bilateral  . Ventral hernia   . Personal history of prostate cancer   . Diverticulosis   . ALLERGIC RHINITIS   . Osteoarthritis   . COPD (chronic obstructive pulmonary disease)   . Tobacco use disorder, continuous   . STEMI (ST elevation myocardial infarction) 04/29/2014    PTCA Lmain and CFX, in setting of VF/VT arrest and CGS  Past Surgical History  Procedure Laterality Date  . Coronary artery bypass graft  1992  . Lumbar laminectomy  [id number]     X2  . Prostatectomy  05/2008    Dr. Alinda Money and XRT  . Appendectomy    . Tonsillectomy    . Left heart catheterization with coronary angiogram N/A 04/30/2014    Procedure: LEFT HEART CATHETERIZATION WITH CORONARY ANGIOGRAM;  Surgeon: Peter M Martinique, MD; Lmain 90% (s/p PTCA), LAD 80%, CFX 100% (s/p PTCA), RCA OK, LIMA-LAD atretic, SVG-OM  100% (chronic), EF 45%, pt req defib x 13 for VF/VT arrest, CHB w/ tem pacer, IABP and intubation  . Coronary artery bypass graft N/A 05/28/2014    Procedure: REDO CORONARY ARTERY BYPASS GRAFTING (CABG);  Surgeon: Grace Isaac, MD;  Location: [place];  Service: Open Heart Surgery;  Laterality: N/A;  Times 4 using right internal mammary artery to Intermediate artery and endoscopically harvested left saphenous vein to LAD, OM1, and PD coronary arteries.  Darden Dates without cardioversion N/A 05/28/2014    Procedure: TRANSESOPHAGEAL ECHOCARDIOGRAM (TEE);  Surgeon: Grace Isaac, MD;  Location: [place];  Service: Open Heart Surgery;  Laterality: N/A;  . Tee without cardioversion N/A 06/24/2014    Procedure: TRANSESOPHAGEAL ECHOCARDIOGRAM (TEE);  Surgeon: Pixie Casino, MD;  Location: [facility] ENDOSCOPY;  Service: Cardiovascular;  Laterality: N/A;  . Cardioversion N/A 06/24/2014    Procedure: CARDIOVERSION;  Surgeon: Pixie Casino, MD;  Location: [facility] ENDOSCOPY;  Service: Cardiovascular;  Laterality: N/A;    Current Outpatient Prescriptions  Medication Sig Dispense Refill  . amiodarone (PACERONE) 200 MG tablet Take 1 tablet (200 mg total) by mouth daily. 30 tablet 11  . apixaban (ELIQUIS) 5 MG TABS tablet Take 1 tablet (5 mg total) by mouth 2 (two) times daily. 180 tablet 0  . atorvastatin (LIPITOR) 40 MG tablet Take 1 tablet (40 mg total) by mouth at bedtime. 30 tablet 11  . feeding supplement, ENSURE COMPLETE, (ENSURE COMPLETE) LIQD Take 237 mLs by mouth 2 (two) times daily between meals.    . ferrous gluconate (FERGON) 324 MG tablet Take 1 tablet (324 mg total) by mouth 2 (two) times daily with a meal.  3  . fluticasone (FLONASE) 50 MCG/ACT nasal spray Place 1 spray into both nostrils as needed for allergies.     . folic acid (FOLVITE) 1 MG tablet Take 1 tablet (1 mg total) by mouth daily.    . furosemide (LASIX) 20 MG tablet Take 1 tablet (20 mg total) by mouth daily. (Patient taking differently: Take 20  mg by mouth every other day. ) 90 tablet 0  . loratadine (CLARITIN) 10 MG tablet Take 10 mg by mouth daily as needed for allergies.     . metoprolol (LOPRESSOR) 50 MG tablet TAKE ONE-HALF (1/2) TABLET (25 MG TOTAL) DAILY 45 tablet 2  . nitroGLYCERIN (NITROSTAT) 0.4 MG SL tablet Place 0.4 mg under the tongue every 5 (five) minutes as needed for chest pain (MAX 3 TABLETS).     . pantoprazole (PROTONIX) 40 MG tablet Take 1 tablet (40 mg total) by mouth daily. 90 tablet 0  . potassium chloride (KLOR-CON) 8 MEQ tablet Take 1 tablet (8 mEq total) by mouth daily. 90 tablet 3  . traMADol (ULTRAM) 50 MG tablet Take 50-100 mg by mouth every 6 (six) hours as needed for moderate pain or severe pain.    . bisacodyl (DULCOLAX) 10 MG suppository Place 1 suppository (10 mg total) rectally daily as needed for moderate  constipation. (Patient not taking: Reported on 07/05/2014) 12 suppository 0   No current facility-administered medications for this visit.    Allergies  Allergen Reactions  . Adhesive [Tape] Other (See Comments)    Takes skin off   . Codeine Sulfate Itching and Other (See Comments)    headaches    History   Social History  . Marital Status: Married    Spouse Name: N/A  . Number of Children: N/A  . Years of Education: N/A   Occupational History  . Retired-self employed    Social History Main Topics  . Smoking status: Current Every Day Smoker -- 40 years    Types: Cigarettes  . Smokeless tobacco: Not on file  . Alcohol Use: No  . Drug Use: No  . Sexual Activity: Yes   Other Topics Concern  . Not on file   Social History Narrative    Family History  Problem Relation Age of Onset  . Hypertension    . Cancer Mother     Colon  . Heart disease Father   . Coronary artery disease Father   . Cancer Sister   . Heart disease Brother     Review of Systems:  As stated in the HPI and otherwise negative.   BP 150/86 mmHg  Pulse 80  Ht 5\' 11"  (1.803 m)  Wt 158 lb 12.8 oz  (72.031 kg)  BMI 22.16 kg/m2  Physical Examination: General: Well developed, well nourished, NAD HEENT: OP clear, mucus membranes moist SKIN: warm, dry. No rashes. Neuro: No focal deficits Musculoskeletal: Muscle strength 5/5 all ext Psychiatric: Mood and affect normal Neck: No JVD, no carotid bruits, no thyromegaly, no lymphadenopathy. Lungs:Clear bilaterally, no wheezes, rhonci, crackles Cardiovascular: Regular rate and rhythm. No murmurs, gallops or rubs. Abdomen:Soft. Bowel sounds present. Non-tender.  Extremities: No lower extremity edema. Pulses are 1-2 + in the bilateral DP/PT.  EKG: atrial fibrillation, rate 80 bpm  Assessment and Plan:   1. CAD: Stable post bypass. Continue statin and beta blocker.   2. PAD: Stable. Needs updating of his ABI this summer. Will plan at f/u visit.   3. Atrial fibrillation/flutter: He appears to be in atrial fibrillation today but rate controlled. Will continue beta blocker and amiodarone. He is being anti-coagulated with Eliquis. No bleeding issues. Recent cardioversion 06/24/14 but back in atrial fib. No plans to repeat cardioversion at this time as he is rate controlled and doing well. Some of his dizziness may be related to his atrial fib but in the post-operative period, may have to try rate control only.   4. Insomnia: Will try Ambien 5 mg at night prn.   5. Tobacco abuse: smoking cessation recommended.

## 2014-07-05 NOTE — Patient Outreach (Signed)
CSW received call from patient. He reported he had received the 10 meals from [facility] and did not need any further meals. He reported that he was continuing to work with the [place] to get approved for increased benefits but no longer needed or wanted follow-up on that. Patient reported there were no further needs for social work. CSW provided contact information should patient need update in the future.

## 2014-07-06 ENCOUNTER — Ambulatory Visit: Payer: Self-pay

## 2014-07-06 DIAGNOSIS — I213 ST elevation (STEMI) myocardial infarction of unspecified site: Secondary | ICD-10-CM | POA: Diagnosis not present

## 2014-07-06 DIAGNOSIS — I2510 Atherosclerotic heart disease of native coronary artery without angina pectoris: Secondary | ICD-10-CM | POA: Diagnosis not present

## 2014-07-06 DIAGNOSIS — I4891 Unspecified atrial fibrillation: Secondary | ICD-10-CM | POA: Diagnosis not present

## 2014-07-06 DIAGNOSIS — Z48812 Encounter for surgical aftercare following surgery on the circulatory system: Secondary | ICD-10-CM | POA: Diagnosis not present

## 2014-07-06 DIAGNOSIS — Z9181 History of falling: Secondary | ICD-10-CM | POA: Diagnosis not present

## 2014-07-06 DIAGNOSIS — Z8673 Personal history of transient ischemic attack (TIA), and cerebral infarction without residual deficits: Secondary | ICD-10-CM | POA: Diagnosis not present

## 2014-07-07 ENCOUNTER — Other Ambulatory Visit: Payer: Self-pay | Admitting: Cardiothoracic Surgery

## 2014-07-07 DIAGNOSIS — Z9181 History of falling: Secondary | ICD-10-CM | POA: Diagnosis not present

## 2014-07-07 DIAGNOSIS — I4892 Unspecified atrial flutter: Secondary | ICD-10-CM

## 2014-07-07 DIAGNOSIS — I4891 Unspecified atrial fibrillation: Secondary | ICD-10-CM | POA: Diagnosis not present

## 2014-07-07 DIAGNOSIS — I213 ST elevation (STEMI) myocardial infarction of unspecified site: Secondary | ICD-10-CM | POA: Diagnosis not present

## 2014-07-07 DIAGNOSIS — I2510 Atherosclerotic heart disease of native coronary artery without angina pectoris: Secondary | ICD-10-CM | POA: Diagnosis not present

## 2014-07-07 DIAGNOSIS — Z8673 Personal history of transient ischemic attack (TIA), and cerebral infarction without residual deficits: Secondary | ICD-10-CM | POA: Diagnosis not present

## 2014-07-07 DIAGNOSIS — Z48812 Encounter for surgical aftercare following surgery on the circulatory system: Secondary | ICD-10-CM | POA: Diagnosis not present

## 2014-07-08 ENCOUNTER — Encounter: Payer: Self-pay | Admitting: Cardiothoracic Surgery

## 2014-07-08 ENCOUNTER — Ambulatory Visit
Admission: RE | Admit: 2014-07-08 | Discharge: 2014-07-08 | Disposition: A | Discharge status: Home / Self Care | Payer: Medicare Other | Source: Ambulatory Visit | Attending: Cardiothoracic Surgery | Admitting: Cardiothoracic Surgery

## 2014-07-08 ENCOUNTER — Ambulatory Visit: Payer: Medicare Other | Admitting: Cardiothoracic Surgery

## 2014-07-08 ENCOUNTER — Ambulatory Visit (INDEPENDENT_AMBULATORY_CARE_PROVIDER_SITE_OTHER): Payer: Self-pay | Admitting: Cardiothoracic Surgery

## 2014-07-08 VITALS — BP 117/70 | HR 76 | Resp 20 | Ht 71.0 in | Wt 158.0 lb

## 2014-07-08 DIAGNOSIS — I2510 Atherosclerotic heart disease of native coronary artery without angina pectoris: Secondary | ICD-10-CM

## 2014-07-08 DIAGNOSIS — I4892 Unspecified atrial flutter: Secondary | ICD-10-CM

## 2014-07-08 DIAGNOSIS — R0602 Shortness of breath: Secondary | ICD-10-CM | POA: Diagnosis not present

## 2014-07-08 DIAGNOSIS — Z951 Presence of aortocoronary bypass graft: Secondary | ICD-10-CM

## 2014-07-08 NOTE — Progress Notes (Signed)
[address]       [place] [address]             [phone]      Lael C Gariepy Trafford Medical Record #[id number] Date of Birth: [birth year]  Referring: Nahser, Wonda Cheng, MD Primary Care: Walker Kehr, MD  Chief Complaint:   POST OP FOLLOW UP 05/27/2014  OPERATIVE REPORT PREOPERATIVE DIAGNOSIS: Three-vessel coronary artery disease with left main obstruction. POSTOPERATIVE DIAGNOSIS: Three-vessel coronary artery disease with left main obstruction. SURGICAL PROCEDURE: Redo Coronary artery bypass grafting with the right internal mammary to the intermediate coronary artery, reverse right greater saphenous vein graft to the obtuse marginal coronary artery, reverse greater saphenous vein graft to the posterior descending coronary artery and reverse Greater saphenous vein graft to the distal LAD with redo sternotomy. SURGEON: Lanelle Bal, MD  History of Present Illness:     Since discharge from the patient's recent complicated course after presenting with a V. fib arrest and subsequently having redo coronary artery bypass grafting he appears to be making reasonable progress. His postoperative course was complicated by recurrent atrial fibrillation. He was admitted in mid March for repeat cardioversion. Initially was discharged to a nursing facility but now is living at home. He is gaining strength. Is mobile with the aid of a rolling walker, mentally very sharp.     Past Medical History  Diagnosis Date  . CAD (coronary artery disease)     CABG x 2 with LIMA to Circumflex and vein graft to PDA  07/13/1991  . History of transient ischemic attack (TIA)   . Hyperlipidemia   . Peripheral neuropathy   . PVD (peripheral vascular disease) with claudication   . Carotid bruit     bilateral  . Ventral hernia   . Personal history of prostate cancer   . Diverticulosis   . ALLERGIC RHINITIS   . Osteoarthritis   . COPD (chronic obstructive  pulmonary disease)   . Tobacco use disorder, continuous   . STEMI (ST elevation myocardial infarction) 04/29/2014    PTCA Lmain and CFX, in setting of VF/VT arrest and CGS     History  Smoking status  . Current Every Day Smoker -- 40 years  . Types: Cigarettes  Smokeless tobacco  . Not on file    History  Alcohol Use No     Allergies  Allergen Reactions  . Adhesive [Tape] Other (See Comments)    Takes skin off   . Codeine Sulfate Itching and Other (See Comments)    headaches    Current Outpatient Prescriptions  Medication Sig Dispense Refill  . amiodarone (PACERONE) 200 MG tablet Take 1 tablet (200 mg total) by mouth daily. 30 tablet 11  . apixaban (ELIQUIS) 5 MG TABS tablet Take 1 tablet (5 mg total) by mouth 2 (two) times daily. 180 tablet 0  . atorvastatin (LIPITOR) 40 MG tablet Take 1 tablet (40 mg total) by mouth at bedtime. 30 tablet 11  . bisacodyl (DULCOLAX) 10 MG suppository Place 1 suppository (10 mg total) rectally daily as needed for moderate constipation. 12 suppository 0  . feeding supplement, ENSURE COMPLETE, (ENSURE COMPLETE) LIQD Take 237 mLs by mouth 2 (two) times daily between meals.    . ferrous gluconate (FERGON) 324 MG tablet Take 1 tablet (324 mg total) by mouth 2 (two) times daily with a meal.  3  . fluticasone (FLONASE) 50 MCG/ACT nasal spray Place 1 spray into both nostrils as needed for  allergies.     . folic acid (FOLVITE) 1 MG tablet Take 1 tablet (1 mg total) by mouth daily.    . furosemide (LASIX) 20 MG tablet Take 1 tablet (20 mg total) by mouth daily. (Patient taking differently: Take 20 mg by mouth every other day. ) 90 tablet 0  . loratadine (CLARITIN) 10 MG tablet Take 10 mg by mouth daily as needed for allergies.     . metoprolol (LOPRESSOR) 50 MG tablet TAKE ONE-HALF (1/2) TABLET (25 MG TOTAL) DAILY 45 tablet 2  . nitroGLYCERIN (NITROSTAT) 0.4 MG SL tablet Place 0.4 mg under the tongue every 5 (five) minutes as needed for chest pain (MAX 3  TABLETS).     . pantoprazole (PROTONIX) 40 MG tablet Take 1 tablet (40 mg total) by mouth daily. 90 tablet 0  . potassium chloride (KLOR-CON) 8 MEQ tablet Take 1 tablet (8 mEq total) by mouth daily. 90 tablet 3  . traMADol (ULTRAM) 50 MG tablet Take 50-100 mg by mouth every 6 (six) hours as needed for moderate pain or severe pain.    Marland Kitchen zolpidem (AMBIEN) 5 MG tablet Take 1 tablet (5 mg total) by mouth at bedtime as needed for sleep. 30 tablet 3   No current facility-administered medications for this visit.       Physical Exam: BP 117/70 mmHg  Pulse 76  Resp 20  Ht 5\' 11"  (1.803 m)  Wt 158 lb (71.668 kg)  BMI 22.05 kg/m2  SpO2 99%  General appearance: alert and cooperative Neurologic: intact Heart: Irregularr rate and rhythm palpation palpation likely atrial fibrillation, S1, S2 normal, no murmur, click, rub or gallop Lungs: clear to auscultation bilaterally Abdomen: soft, non-tender; bowel sounds normal; no masses,  no organomegaly Extremities: extremities normal, atraumatic, no cyanosis or edema and Homans sign is negative, no sign of DVT Wound: Sternum is stable and well healed   Diagnostic Studies & Laboratory data:     Recent Radiology Findings:   Dg Chest 2 View  07/08/2014   CLINICAL DATA:  CABG all May 28, 2014 ; recent cardioversion ; chest pain and shortness of breath; current tobacco use  EXAM: CHEST  2 VIEW  COMPARISON:  PA and lateral chest of June 22, 2014  FINDINGS: The lungs are mildly hyperinflated. There is no focal infiltrate or pneumothorax. There is density just lateral to the left cardiac apex which is slightly more conspicuous than in the past that may reflect minimal atelectasis versus residual scarring. The heart is normal in size. The pulmonary vascularity is not engorged. There are 9 intact sternal wires. The mediastinum is normal in width. The trachea is midline. There is no pleural effusion.  IMPRESSION: COPD. Lingular scarring versus atelectasis  slightly more conspicuous than on the March 15th but improved since February 22nd 2016. There is no evidence of CHF.   Electronically Signed   By: David  Martinique   On: 07/08/2014 16:28      Recent Lab Findings: Lab Results  Component Value Date   WBC 7.8 06/29/2014   HGB 12.6* 06/29/2014   HCT 37.6* 06/29/2014   PLT 445.0* 06/29/2014   GLUCOSE 144* 06/29/2014   CHOL 146 11/19/2012   TRIG 93.0 11/19/2012   HDL 38.50* 11/19/2012   LDLCALC 89 11/19/2012   ALT 30 06/22/2014   AST 21 06/22/2014   NA 133* 06/29/2014   K 3.3* 06/29/2014   CL 96 06/29/2014   CREATININE 1.00 06/29/2014   BUN 15 06/29/2014   CO2 30  06/29/2014   TSH 1.14 04/12/2011   INR 1.30 05/28/2014   HGBA1C 5.7 07/11/2006      Assessment / Plan:      Patient following recent redo coronary artery bypass grafting after a long complicated preoperative course including presentation with V fib arrest His wounds are healing well and he physically is making progress. I've encouraged him to enroll in cardiac rehabilitation program when it home physical therapy ends From a surgical standpoint he could return to driving, however because of his episodes of ventricular fibrillation this final decision will be left to cardiology and/or EP Recurrent atrial fibrillation currently on Eliquis I plan to see him back in 2 months   Grace Isaac MD      [address] [place], [address] Office [phone]   Beeper [phone]  07/08/2014 5:44 PM

## 2014-07-12 DIAGNOSIS — I213 ST elevation (STEMI) myocardial infarction of unspecified site: Secondary | ICD-10-CM | POA: Diagnosis not present

## 2014-07-12 DIAGNOSIS — Z8673 Personal history of transient ischemic attack (TIA), and cerebral infarction without residual deficits: Secondary | ICD-10-CM | POA: Diagnosis not present

## 2014-07-12 DIAGNOSIS — I4891 Unspecified atrial fibrillation: Secondary | ICD-10-CM | POA: Diagnosis not present

## 2014-07-12 DIAGNOSIS — Z48812 Encounter for surgical aftercare following surgery on the circulatory system: Secondary | ICD-10-CM | POA: Diagnosis not present

## 2014-07-12 DIAGNOSIS — Z9181 History of falling: Secondary | ICD-10-CM | POA: Diagnosis not present

## 2014-07-12 DIAGNOSIS — I2510 Atherosclerotic heart disease of native coronary artery without angina pectoris: Secondary | ICD-10-CM | POA: Diagnosis not present

## 2014-07-13 ENCOUNTER — Ambulatory Visit: Payer: Self-pay | Admitting: Physician Assistant

## 2014-07-13 DIAGNOSIS — Z48812 Encounter for surgical aftercare following surgery on the circulatory system: Secondary | ICD-10-CM | POA: Diagnosis not present

## 2014-07-13 DIAGNOSIS — I2510 Atherosclerotic heart disease of native coronary artery without angina pectoris: Secondary | ICD-10-CM | POA: Diagnosis not present

## 2014-07-13 DIAGNOSIS — Z8673 Personal history of transient ischemic attack (TIA), and cerebral infarction without residual deficits: Secondary | ICD-10-CM | POA: Diagnosis not present

## 2014-07-13 DIAGNOSIS — I4891 Unspecified atrial fibrillation: Secondary | ICD-10-CM | POA: Diagnosis not present

## 2014-07-13 DIAGNOSIS — Z9181 History of falling: Secondary | ICD-10-CM | POA: Diagnosis not present

## 2014-07-13 DIAGNOSIS — I213 ST elevation (STEMI) myocardial infarction of unspecified site: Secondary | ICD-10-CM | POA: Diagnosis not present

## 2014-07-14 ENCOUNTER — Other Ambulatory Visit: Payer: Self-pay | Admitting: *Deleted

## 2014-07-14 DIAGNOSIS — E785 Hyperlipidemia, unspecified: Secondary | ICD-10-CM

## 2014-07-14 MED ORDER — ATORVASTATIN CALCIUM 40 MG PO TABS: 40.0000 mg | ORAL_TABLET | Freq: Every day | ORAL | Status: DC

## 2014-07-14 MED ORDER — FERROUS GLUCONATE 324 (38 FE) MG PO TABS: 324.0000 mg | ORAL_TABLET | Freq: Two times a day (BID) | ORAL | Status: DC

## 2014-07-15 ENCOUNTER — Other Ambulatory Visit: Payer: Self-pay | Admitting: Cardiovascular Disease

## 2014-07-15 DIAGNOSIS — Z8673 Personal history of transient ischemic attack (TIA), and cerebral infarction without residual deficits: Secondary | ICD-10-CM | POA: Diagnosis not present

## 2014-07-15 DIAGNOSIS — I213 ST elevation (STEMI) myocardial infarction of unspecified site: Secondary | ICD-10-CM | POA: Diagnosis not present

## 2014-07-15 DIAGNOSIS — Z9181 History of falling: Secondary | ICD-10-CM | POA: Diagnosis not present

## 2014-07-15 DIAGNOSIS — I4891 Unspecified atrial fibrillation: Secondary | ICD-10-CM | POA: Diagnosis not present

## 2014-07-15 DIAGNOSIS — Z48812 Encounter for surgical aftercare following surgery on the circulatory system: Secondary | ICD-10-CM | POA: Diagnosis not present

## 2014-07-15 DIAGNOSIS — I2510 Atherosclerotic heart disease of native coronary artery without angina pectoris: Secondary | ICD-10-CM | POA: Diagnosis not present

## 2014-07-19 ENCOUNTER — Ambulatory Visit (INDEPENDENT_AMBULATORY_CARE_PROVIDER_SITE_OTHER): Payer: Medicare Other | Admitting: Internal Medicine

## 2014-07-19 ENCOUNTER — Encounter: Payer: Self-pay | Admitting: Internal Medicine

## 2014-07-19 VITALS — BP 94/60 | HR 79 | Wt 157.0 lb

## 2014-07-19 DIAGNOSIS — I4892 Unspecified atrial flutter: Secondary | ICD-10-CM

## 2014-07-19 DIAGNOSIS — I4891 Unspecified atrial fibrillation: Secondary | ICD-10-CM | POA: Diagnosis not present

## 2014-07-19 DIAGNOSIS — F17209 Nicotine dependence, unspecified, with unspecified nicotine-induced disorders: Secondary | ICD-10-CM

## 2014-07-19 DIAGNOSIS — I2510 Atherosclerotic heart disease of native coronary artery without angina pectoris: Secondary | ICD-10-CM

## 2014-07-19 DIAGNOSIS — M5442 Lumbago with sciatica, left side: Secondary | ICD-10-CM

## 2014-07-19 DIAGNOSIS — I255 Ischemic cardiomyopathy: Secondary | ICD-10-CM | POA: Diagnosis not present

## 2014-07-19 DIAGNOSIS — Z48812 Encounter for surgical aftercare following surgery on the circulatory system: Secondary | ICD-10-CM | POA: Diagnosis not present

## 2014-07-19 DIAGNOSIS — I480 Paroxysmal atrial fibrillation: Secondary | ICD-10-CM | POA: Diagnosis not present

## 2014-07-19 DIAGNOSIS — M5441 Lumbago with sciatica, right side: Secondary | ICD-10-CM

## 2014-07-19 DIAGNOSIS — I213 ST elevation (STEMI) myocardial infarction of unspecified site: Secondary | ICD-10-CM | POA: Diagnosis not present

## 2014-07-19 DIAGNOSIS — Z9861 Coronary angioplasty status: Secondary | ICD-10-CM

## 2014-07-19 DIAGNOSIS — I484 Atypical atrial flutter: Secondary | ICD-10-CM | POA: Diagnosis not present

## 2014-07-19 DIAGNOSIS — Z8673 Personal history of transient ischemic attack (TIA), and cerebral infarction without residual deficits: Secondary | ICD-10-CM | POA: Diagnosis not present

## 2014-07-19 DIAGNOSIS — E785 Hyperlipidemia, unspecified: Secondary | ICD-10-CM

## 2014-07-19 DIAGNOSIS — Z9181 History of falling: Secondary | ICD-10-CM | POA: Diagnosis not present

## 2014-07-19 DIAGNOSIS — Z720 Tobacco use: Secondary | ICD-10-CM

## 2014-07-19 MED ORDER — FERROUS GLUCONATE 324 (38 FE) MG PO TABS: 324.0000 mg | ORAL_TABLET | Freq: Two times a day (BID) | ORAL | Status: DC

## 2014-07-19 MED ORDER — ATORVASTATIN CALCIUM 40 MG PO TABS: 40.0000 mg | ORAL_TABLET | Freq: Every day | ORAL | Status: DC

## 2014-07-19 MED ORDER — APIXABAN 5 MG PO TABS: 5.0000 mg | ORAL_TABLET | Freq: Two times a day (BID) | ORAL | Status: DC

## 2014-07-19 MED ORDER — TRAMADOL HCL 50 MG PO TABS: 50.0000 mg | ORAL_TABLET | Freq: Four times a day (QID) | ORAL | Status: DC | PRN

## 2014-07-19 NOTE — Assessment & Plan Note (Signed)
Doing well overall On Eliquis, Amiodarone

## 2014-07-19 NOTE — Progress Notes (Signed)
Pre visit review using our clinic review tool, if applicable. No additional management support is needed unless otherwise documented below in the visit note. 

## 2014-07-19 NOTE — Assessment & Plan Note (Signed)
Doing well overall On Eliquis, Lipitor

## 2014-07-19 NOTE — Progress Notes (Signed)
   Subjective:    HPI       The patient presents for a follow-up of  Recent MI, chronic hypertension, chronic dyslipidemia, PVD, COPD controlled with medicines. F/u pain in LS spine, legs 6-7/10 worse lately F/u rash on B palms and soles, worse x wks F/u skin lesions   Wt Readings from Last 3 Encounters:  07/19/14 157 lb (71.215 kg)  07/08/14 158 lb (71.668 kg)  07/05/14 158 lb 12.8 oz (72.031 kg)   BP Readings from Last 3 Encounters:  07/19/14 94/60  07/08/14 117/70  07/05/14 150/86     Review of Systems  Constitutional: Negative for appetite change, fatigue and unexpected weight change.  HENT: Positive for congestion, nosebleeds and rhinorrhea. Negative for sneezing, sore throat and trouble swallowing.   Eyes: Negative for itching and visual disturbance.  Respiratory: Positive for cough.   Cardiovascular: Negative for chest pain, palpitations and leg swelling.  Gastrointestinal: Negative for nausea, diarrhea, blood in stool and abdominal distention.  Genitourinary: Negative for frequency and hematuria.  Musculoskeletal: Positive for back pain, arthralgias and gait problem. Negative for joint swelling and neck pain.  Skin: Negative for rash.  Neurological: Negative for dizziness, tremors, speech difficulty and weakness.  Psychiatric/Behavioral: Negative for sleep disturbance, dysphoric mood and agitation. The patient is not nervous/anxious.        Objective:   Physical Exam  Constitutional: He is oriented to person, place, and time. He appears well-developed. No distress.  NAD  HENT:  Mouth/Throat: Oropharynx is clear and moist.  Eyes: Conjunctivae are normal. Pupils are equal, round, and reactive to light.  Neck: Normal range of motion. No JVD present. No thyromegaly present.  Cardiovascular: Normal rate, regular rhythm, normal heart sounds and intact distal pulses.  Exam reveals no gallop and no friction rub.   No murmur heard. Pulmonary/Chest: Effort normal  and breath sounds normal. No respiratory distress. He has no wheezes. He has no rales. He exhibits no tenderness.  Abdominal: Soft. Bowel sounds are normal. He exhibits no distension and no mass. There is no tenderness. There is no rebound and no guarding.  Musculoskeletal: Normal range of motion. He exhibits no edema or tenderness.  Lymphadenopathy:    He has no cervical adenopathy.  Neurological: He is alert and oriented to person, place, and time. He has normal reflexes. No cranial nerve deficit. He exhibits normal muscle tone. He displays a negative Romberg sign. Coordination and gait normal.  No meningeal signs  Skin: Skin is warm and dry. No rash noted.  Psychiatric: He has a normal mood and affect. His behavior is normal. Judgment and thought content normal.  less bruises  AKs Eczema on B palms and B feet   Lab Results  Component Value Date   WBC 7.8 06/29/2014   HGB 12.6* 06/29/2014   HCT 37.6* 06/29/2014   PLT 445.0* 06/29/2014   GLUCOSE 144* 06/29/2014   CHOL 146 11/19/2012   TRIG 93.0 11/19/2012   HDL 38.50* 11/19/2012   LDLCALC 89 11/19/2012   ALT 30 06/22/2014   AST 21 06/22/2014   NA 133* 06/29/2014   K 3.3* 06/29/2014   CL 96 06/29/2014   CREATININE 1.00 06/29/2014   BUN 15 06/29/2014   CO2 30 06/29/2014   TSH 1.14 04/12/2011   PSA 0.01* 02/20/2012   INR 1.30 05/28/2014   HGBA1C 5.7 07/11/2006         Assessment & Plan:

## 2014-07-19 NOTE — Assessment & Plan Note (Signed)
On Lipitor 

## 2014-07-19 NOTE — Assessment & Plan Note (Signed)
Using a walker In PT at home

## 2014-07-19 NOTE — Assessment & Plan Note (Addendum)
Doing well overall On Eliquis, Amiodarone

## 2014-07-19 NOTE — Assessment & Plan Note (Signed)
Discussed On 2-3 cigs a day

## 2014-07-20 DIAGNOSIS — Z8673 Personal history of transient ischemic attack (TIA), and cerebral infarction without residual deficits: Secondary | ICD-10-CM | POA: Diagnosis not present

## 2014-07-20 DIAGNOSIS — I4891 Unspecified atrial fibrillation: Secondary | ICD-10-CM | POA: Diagnosis not present

## 2014-07-20 DIAGNOSIS — Z48812 Encounter for surgical aftercare following surgery on the circulatory system: Secondary | ICD-10-CM | POA: Diagnosis not present

## 2014-07-20 DIAGNOSIS — Z9181 History of falling: Secondary | ICD-10-CM | POA: Diagnosis not present

## 2014-07-20 DIAGNOSIS — I2510 Atherosclerotic heart disease of native coronary artery without angina pectoris: Secondary | ICD-10-CM | POA: Diagnosis not present

## 2014-07-20 DIAGNOSIS — I213 ST elevation (STEMI) myocardial infarction of unspecified site: Secondary | ICD-10-CM | POA: Diagnosis not present

## 2014-07-21 ENCOUNTER — Other Ambulatory Visit: Payer: Self-pay

## 2014-07-21 ENCOUNTER — Telehealth: Payer: Self-pay

## 2014-07-21 DIAGNOSIS — C61 Malignant neoplasm of prostate: Secondary | ICD-10-CM | POA: Diagnosis not present

## 2014-07-21 DIAGNOSIS — I2510 Atherosclerotic heart disease of native coronary artery without angina pectoris: Secondary | ICD-10-CM

## 2014-07-21 MED ORDER — FUROSEMIDE 20 MG PO TABS: 20.0000 mg | ORAL_TABLET | ORAL | Status: DC

## 2014-07-21 MED ORDER — AMIODARONE HCL 200 MG PO TABS: 200.0000 mg | ORAL_TABLET | Freq: Every day | ORAL | Status: DC

## 2014-07-21 MED ORDER — PANTOPRAZOLE SODIUM 40 MG PO TBEC: 40.0000 mg | DELAYED_RELEASE_TABLET | Freq: Every day | ORAL | Status: DC

## 2014-07-21 NOTE — Telephone Encounter (Signed)
Fraser Din, can we get the order for cardiac rehab and I can sign? Thanks, chris

## 2014-07-22 NOTE — Telephone Encounter (Signed)
Completed form given to medical records to fax 

## 2014-07-22 NOTE — Telephone Encounter (Signed)
Form has already been completed and faxed to Cardiac Rehab.  It has been sent to scan center to be scanned into chart. I spoke with Cardiac Rehab and they did not receive completed referral fax. Cardiac Rehab will resend to our office for Dr. Angelena Form to complete

## 2014-07-26 ENCOUNTER — Telehealth: Payer: Self-pay | Admitting: Internal Medicine

## 2014-07-26 DIAGNOSIS — Z9181 History of falling: Secondary | ICD-10-CM | POA: Diagnosis not present

## 2014-07-26 DIAGNOSIS — I2510 Atherosclerotic heart disease of native coronary artery without angina pectoris: Secondary | ICD-10-CM | POA: Diagnosis not present

## 2014-07-26 DIAGNOSIS — I4891 Unspecified atrial fibrillation: Secondary | ICD-10-CM | POA: Diagnosis not present

## 2014-07-26 DIAGNOSIS — Z48812 Encounter for surgical aftercare following surgery on the circulatory system: Secondary | ICD-10-CM | POA: Diagnosis not present

## 2014-07-26 DIAGNOSIS — I213 ST elevation (STEMI) myocardial infarction of unspecified site: Secondary | ICD-10-CM | POA: Diagnosis not present

## 2014-07-26 DIAGNOSIS — Z8673 Personal history of transient ischemic attack (TIA), and cerebral infarction without residual deficits: Secondary | ICD-10-CM | POA: Diagnosis not present

## 2014-07-26 NOTE — Telephone Encounter (Signed)
He is discharged from occupational therapy. He is still has one more visit with physical therapy. You suggest outpatient cardio therapy.

## 2014-07-26 NOTE — Telephone Encounter (Signed)
Please advise, thanks.

## 2014-07-27 ENCOUNTER — Telehealth: Payer: Self-pay | Admitting: Cardiovascular Disease

## 2014-07-27 NOTE — Telephone Encounter (Signed)
New Message    Patient is returning a call that was placed to him. Please give patient a call.

## 2014-07-27 NOTE — Telephone Encounter (Signed)
Advised pt that cardiologist has been referred, pt is to call Dr Angelena Form to check on cardio therapy

## 2014-07-27 NOTE — Telephone Encounter (Signed)
Yes. OK Cardiac rehab Thx

## 2014-07-27 NOTE — Telephone Encounter (Signed)
I do not see that a call was placed from our office.  I spoke with cardiac rehab and they have paperwork for cardiac rehab and will be contacting pt to start once he finishes physical therapy.  I spoke with pt and gave him this information.

## 2014-07-27 NOTE — Telephone Encounter (Signed)
Patient needs to call his cardiologist, Dr. Angelena Form [phone]). The cardiology office would order cardiac rehab and it looks like they have already started this process. See telephone encounter dated 07/21/14.

## 2014-07-28 DIAGNOSIS — R310 Gross hematuria: Secondary | ICD-10-CM | POA: Diagnosis not present

## 2014-07-28 DIAGNOSIS — N5201 Erectile dysfunction due to arterial insufficiency: Secondary | ICD-10-CM | POA: Diagnosis not present

## 2014-07-28 DIAGNOSIS — C61 Malignant neoplasm of prostate: Secondary | ICD-10-CM | POA: Diagnosis not present

## 2014-07-29 DIAGNOSIS — I2510 Atherosclerotic heart disease of native coronary artery without angina pectoris: Secondary | ICD-10-CM | POA: Diagnosis not present

## 2014-07-29 DIAGNOSIS — Z48812 Encounter for surgical aftercare following surgery on the circulatory system: Secondary | ICD-10-CM | POA: Diagnosis not present

## 2014-07-29 DIAGNOSIS — Z8673 Personal history of transient ischemic attack (TIA), and cerebral infarction without residual deficits: Secondary | ICD-10-CM | POA: Diagnosis not present

## 2014-07-29 DIAGNOSIS — Z9181 History of falling: Secondary | ICD-10-CM | POA: Diagnosis not present

## 2014-07-29 DIAGNOSIS — I213 ST elevation (STEMI) myocardial infarction of unspecified site: Secondary | ICD-10-CM | POA: Diagnosis not present

## 2014-07-29 DIAGNOSIS — I4891 Unspecified atrial fibrillation: Secondary | ICD-10-CM | POA: Diagnosis not present

## 2014-07-30 ENCOUNTER — Other Ambulatory Visit: Payer: Self-pay

## 2014-07-30 MED ORDER — FOLIC ACID 1 MG PO TABS: 1.0000 mg | ORAL_TABLET | Freq: Every day | ORAL | Status: DC

## 2014-08-10 ENCOUNTER — Telehealth (HOSPITAL_COMMUNITY): Payer: Self-pay | Admitting: Cardiac Rehabilitation

## 2014-08-10 NOTE — Telephone Encounter (Signed)
pc to pt for pre-cardiac rehab nursing assessment.  During conversation, pt discusses 2 recent falls since he has been home from [facility] skilled rehabilitation.  Pt has completed his home health physical therapy however he c/o difficulty with gait and balance, continues to use cane and walker at home.   [facility] recommendation has been forwarded to Dr. Alain Marion.  Referral form faxed for Dr. Alain Marion to sign and forward to [facility].  Pt is aware and agrees with this recommendation.  Orientation and cardiac rehab classes cancelled at this time to be rescheduled once pt completes [facility].

## 2014-08-11 ENCOUNTER — Telehealth: Payer: Self-pay | Admitting: Internal Medicine

## 2014-08-11 MED ORDER — PANTOPRAZOLE SODIUM 40 MG PO TBEC: 40.0000 mg | DELAYED_RELEASE_TABLET | Freq: Every day | ORAL | Status: DC

## 2014-08-11 MED ORDER — FOLIC ACID 1 MG PO TABS: 1.0000 mg | ORAL_TABLET | Freq: Every day | ORAL | Status: DC

## 2014-08-11 MED ORDER — FLUTICASONE PROPIONATE 50 MCG/ACT NA SUSP: 1.0000 | NASAL | Status: DC | PRN

## 2014-08-11 NOTE — Telephone Encounter (Signed)
I would thnk so: needs both, however Dr Camillia Herter note did not mentioned Plavix. Is he taking Plavix? We can doublecheck w/Dr McAlhany's nurse - forward this message. Thx

## 2014-08-11 NOTE — Telephone Encounter (Signed)
Pt called in and has several question about meds.  He would not give me anymore info then just this.  Sorry Marzetta Board    Best number [phone]-- Cell number

## 2014-08-11 NOTE — Telephone Encounter (Signed)
Rec refill req for generic Plavix 75 mg 90 day supply. Med is not active on list. Does he need Plavix as well as Eliquis?

## 2014-08-12 ENCOUNTER — Ambulatory Visit (HOSPITAL_COMMUNITY): Payer: Medicare Other

## 2014-08-12 ENCOUNTER — Ambulatory Visit: Payer: Medicare Other | Attending: Internal Medicine

## 2014-08-12 DIAGNOSIS — R269 Unspecified abnormalities of gait and mobility: Secondary | ICD-10-CM | POA: Diagnosis not present

## 2014-08-12 DIAGNOSIS — R293 Abnormal posture: Secondary | ICD-10-CM | POA: Diagnosis not present

## 2014-08-12 DIAGNOSIS — R279 Unspecified lack of coordination: Secondary | ICD-10-CM | POA: Diagnosis not present

## 2014-08-12 NOTE — Therapy (Signed)
[facility] [address] [place], [place], [address] Phone: [phone]   Fax:  [phone]  Physical Therapy Evaluation  Patient Details  Name: [name] MRN: [id number] Date of Birth: [birth year] Referring Provider:  Cassandria Anger, MD  Encounter Date: 08/12/2014      PT End of Session - 08/12/14 1643    Visit Number 1   Number of Visits 9   Date for PT Re-Evaluation 09/24/14   Authorization Type Medicare G codes required   PT Start Time 1696   PT Stop Time 1625   PT Time Calculation (min) 55 min      Past Medical History  Diagnosis Date  . CAD (coronary artery disease)     CABG x 2 with LIMA to Circumflex and vein graft to PDA  07/13/1991  . History of transient ischemic attack (TIA)   . Hyperlipidemia   . Peripheral neuropathy   . PVD (peripheral vascular disease) with claudication   . Carotid bruit     bilateral  . Ventral hernia   . Personal history of prostate cancer   . Diverticulosis   . ALLERGIC RHINITIS   . Osteoarthritis   . COPD (chronic obstructive pulmonary disease)   . Tobacco use disorder, continuous   . STEMI (ST elevation myocardial infarction) 04/29/2014    PTCA Lmain and CFX, in setting of VF/VT arrest and CGS    Past Surgical History  Procedure Laterality Date  . Coronary artery bypass graft  1992  . Lumbar laminectomy  [id number]     X2  . Prostatectomy  05/2008    Dr. Alinda Money and XRT  . Appendectomy    . Tonsillectomy    . Left heart catheterization with coronary angiogram N/A 04/30/2014    Procedure: LEFT HEART CATHETERIZATION WITH CORONARY ANGIOGRAM;  Surgeon: Peter M Martinique, MD; Lmain 90% (s/p PTCA), LAD 80%, CFX 100% (s/p PTCA), RCA OK, LIMA-LAD atretic, SVG-OM 100% (chronic), EF 45%, pt req defib x 13 for VF/VT arrest, CHB w/ tem pacer, IABP and intubation  . Coronary artery bypass graft N/A 05/28/2014    Procedure: REDO CORONARY ARTERY BYPASS GRAFTING (CABG);   Surgeon: Grace Isaac, MD;  Location: [facility];  Service: Open Heart Surgery;  Laterality: N/A;  Times 4 using right internal mammary artery to Intermediate artery and endoscopically harvested left saphenous vein to LAD, OM1, and PD coronary arteries.  Darden Dates without cardioversion N/A 05/28/2014    Procedure: TRANSESOPHAGEAL ECHOCARDIOGRAM (TEE);  Surgeon: Grace Isaac, MD;  Location: [facility];  Service: Open Heart Surgery;  Laterality: N/A;  . [place] without cardioversion N/A 06/24/2014    Procedure: TRANSESOPHAGEAL ECHOCARDIOGRAM (TEE);  Surgeon: Pixie Casino, MD;  Location: [facility];  Service: Cardiovascular;  Laterality: N/A;  . Cardioversion N/A 06/24/2014    Procedure: CARDIOVERSION;  Surgeon: Pixie Casino, MD;  Location: [facility] ENDOSCOPY;  Service: Cardiovascular;  Laterality: N/A;    There were no vitals filed for this visit.  Visit Diagnosis:  Lack of coordination - Plan: PT plan of care cert/re-cert  Abnormality of gait - Plan: PT plan of care cert/re-cert  Abnormal posture - Plan: PT plan of care cert/re-cert      Subjective Assessment - 08/12/14 1540    Subjective Pt had a CABG x4 in February. Pt had a complicated hospitalizatoin course___ including 2 falls in the hospital while standing without a walker. He also went to a skilled nursing facility for rehab.  Pt was  planning to go to cardiac rehab but due to frequent falls (3 at nursing home, 2 at hospital, 1x at home) decided to participate in PT to address balance prior to cardiac rehab.   Pertinent History bilateral foot drop, hx of lumbar spinal surgery,    Patient Stated Goals increase balance in order to participate in cardiac rehab   Currently in Pain? Yes   Pain Score 6    Pain Location Back  legs, R shoulder, neck   Pain Descriptors / Indicators Sharp;Aching   Pain Type Chronic pain   Pain Onset More than a month ago  21 years            [facility] PT Assessment - 08/12/14 0001    Assessment   Medical  Diagnosis difficulty with gait, balance, frequent falls   Onset Date 04/29/14   Prior Therapy --  hospital, skilled nursing, home health--ended last week   Precautions   Precautions Fall;ICD/Pacemaker   Balance Screen   Has the patient fallen in the past 6 months Yes   How many times? 6   Has the patient had a decrease in activity level because of a fear of falling?  No   Is the patient reluctant to leave their home because of a fear of falling?  No   Home Environment   Living Enviornment Private residence   Living Arrangements Spouse/significant other   Available Help at Discharge Family   Type of [place] Access --  single step to enter   Home Layout Two level;Full bath on main level;Able to live on main level with bedroom/bathroom   Alternate Level Stairs-Number of Steps 14   Alternate Level Stairs-Rails Can reach both   [place] - single point;Walker - 4 wheels;Walker - 2 wheels   Additional Comments PLOF only ambulating 1/4 mile   Prior Function   Level of Independence Requires assistive device for independence  he has been using a cane for 20+ years; now using rollator   Vocation Retired   Leisure traveling to [employer], [place]   Observation/Other Assessments   Focus on Therapeutic Outcomes (FOTO)  did not complete upon arrival   Posture/Postural Control   Posture/Postural Control Postural limitations   Postural Limitations Rounded Shoulders;Forward head  left spinal curve   ROM / Strength   AROM / PROM / Strength Strength   Strength   Overall Strength Comments Hip flexion R 4/5, L 4/5, quads R 5/5, L 4/5, hams 5/5 bilat, dorsiflexion 5/5   Flexibility   Soft Tissue Assessment /Muscle Length --  good calf length   Transfers   Transfers Sit to Stand;Stand to Sit  independent, but pt reports LOB when rising and turning quic   Ambulation/Gait   Ambulation/Gait Yes   Ambulation/Gait Assistance 5: Supervision   Ambulation/Gait Assistance Details  unsteady with left turn, able to self correct   Ambulation Distance (Feet) 200 Feet   Assistive device Straight cane   Gait Pattern Decreased dorsiflexion - left;Poor foot clearance - left   Ambulation Surface Level;Indoor   Gait velocity 2.58 ft/sec  <2..63 ft/sec indicative of limited community ambulator   Standardized Balance Assessment   Standardized Balance Assessment Berg Balance Test;Timed Up and Go Test   Berg Balance Test   Sit to Stand Able to stand without using hands and stabilize independently   Standing Unsupported Able to stand safely 2 minutes   Sitting with Back Unsupported but Feet Supported on Floor or Stool Able to  sit safely and securely 2 minutes   Stand to Sit Sits safely with minimal use of hands   Transfers Able to transfer safely, minor use of hands   Standing Unsupported with Eyes Closed Able to stand 10 seconds safely   Standing Ubsupported with Feet Together Able to place feet together independently and stand 1 minute safely   From Standing, Reach Forward with Outstretched Arm Can reach forward >12 cm safely (5")   From Standing Position, Pick up Object from Floor Able to pick up shoe safely and easily   From Standing Position, Turn to Look Behind Over each Shoulder Turn sideways only but maintains balance   Turn 360 Degrees Able to turn 360 degrees safely but slowly   Standing Unsupported, Alternately Place Feet on Step/Stool Able to stand independently and safely and complete 8 steps in 20 seconds   Standing Unsupported, One Foot in Front Able to plae foot ahead of the other independently and hold 30 seconds   Standing on One Leg Tries to lift leg/unable to hold 3 seconds but remains standing independently   Total Score 47   Timed Up and Go Test   TUG Normal TUG   Normal TUG (seconds) 9.57  not indicative of fall risk                                PT Long Term Goals - 08/12/14 1655    PT LONG TERM GOAL #1   Title Demonstrate  correct performance of HEP to address balance impairment. Target 09/09/14   PT LONG TERM GOAL #2   Title Demonstrate ability to turn to look over left and right shoulder without unsteadiness (as in [place]). Target 09/09/14   PT LONG TERM GOAL #3   Title Complete DGI and write appropriate goal: ______________ Target 09/09/14   PT LONG TERM GOAL #4   Title Demonstrate ability to rise from sci fit and turn left without loss of balance; and to transfer on/off of elliptical without loss of balance for increased readiness for cardiac rehab. Target 09/09/14   PT LONG TERM GOAL #5   Title Verbalize understanding of fall prevention strategies in home environment. Target 09/09/14               Plan - 08/12/14 1648    Clinical Impression Statement Pt demonstrated fairly good static standing balance upon assessment today. His lower extremity strength and flexibility is also [facility] however he has a left foot drag with ambulation which is inconsistent with testing. Pt reports this is an intentional gait deviation "I do it to keep the foot from dropping." Pt's primary balance impairment is most present with turning to look behind him, and turning to the left while ambulating and this could be partially due to his L spinal curve. This lack of balance with turning is of significant concern as pt will be rising and turning frequently during cardiac rehab and must have balance necessary for this to decrease risk of falls.    Pt will benefit from skilled therapeutic intervention in order to improve on the following deficits Abnormal gait;Decreased coordination;Postural dysfunction;Decreased balance   Rehab Potential Good   PT Frequency 2x / week   PT Duration 4 weeks   PT Treatment/Interventions ADLs/Self Care Home Management;Therapeutic activities;Patient/family education;Therapeutic exercise;Gait training;Balance training;Stair training;Neuromuscular re-education   PT Next Visit Plan DGI and goal, HEP to include turning to  look over shoulder, turning 360  degrees in place and figure 8 while ambulating, calf stretch and heel walking, posture education (L curve noted--is this fixed or flexible)   Consulted and Agree with Plan of Care Patient          G-Codes - 22-Aug-2014 1702    Functional Assessment Tool Used Merrilee Jansky 47/56; unsteadiness while turning left; reported fall (at home) when rising and turning; DGI TBD   Functional Limitation Mobility: Walking and moving around   Mobility: Walking and Moving Around Current Status [phone]) At least 20 percent but less than 40 percent impaired, limited or restricted   Mobility: Walking and Moving Around Goal Status [phone]) At least 1 percent but less than 20 percent impaired, limited or restricted       Problem List Patient Active Problem List   Diagnosis Date Noted  . Hypokalemia 06/29/2014  . PVD (peripheral vascular disease) with claudication   . COPD (chronic obstructive pulmonary disease)   . Tobacco use disorder, continuous   . Hyponatremia   . Nausea with vomiting   . Orthostatic hypotension 06/22/2014  . Dehydration 06/22/2014  . Atrial flutter with rapid ventricular response 06/22/2014  . Pain in joint, shoulder region 06/14/2014  . Acute blood loss anemia 06/09/2014  . Hypertensive heart disease 06/09/2014  . Hematuria 05/31/2014  . S/P CABG x 4 05/28/2014  . PAF- on Amiodarone 05/25/2014  . Chronic anticoagulation-on Eliquis at discharge 05/11/14 05/25/2014  . ICM-EF 45-50% by echo 04/30/14 05/25/2014  . Protein-calorie malnutrition, severe 05/25/2014  . Encephalopathy 05/14/2014  . Acute respiratory acidosis   . S/P CABG x 2 05/04/2014  . Acute respiratory failure   . STEMI 05/04/14 with shock, CPR, CHB, VT 04/30/2014  . Cardiogenic shock 04/30/2014  . Cardiac arrest 04/30/2014  . VT (ventricular tachycardia) 04/30/2014  . CHB (complete heart block) 04/30/2014  . CAD S/P LM and CFX PCI 05/04/14 04/30/2014  . Wart viral 04/18/2014  . Laceration of  index finger 12/04/2012  . Hand eczema 05/26/2012  . Bruising 02/20/2012  . Actinic keratoses 11/14/2011  . PAD (peripheral artery disease) 08/28/2011  . Constipation 07/18/2011  . Sinusitis acute 04/18/2011  . Ventral hernia 01/03/2011  . SWEATING 06/26/2010  . History of transient ischemic attack (TIA)   . CERUMEN IMPACTION 02/20/2010  . DIVERTICULOSIS-COLON 11/01/2009  . RECTAL BLEEDING 11/01/2009  . ABDOMINAL BLOATING 11/01/2009  . ABNORMAL BOWEL SOUNDS 11/01/2009  . ABDOMINAL PAIN-RLQ 11/01/2009  . ABDOMINAL PAIN-LLQ 11/01/2009  . ABDOMINAL PAIN, GENERALIZED 11/01/2009  . Rash and nonspecific skin eruption 07/11/2009  . TRANSIENT ISCHEMIC ATTACK, HX OF 03/22/2009  . PVD WITH CLAUDICATION 03/14/2009  . BRONCHITIS, ACUTE 11/26/2008  . Personal history of malignant neoplasm of prostate 08/02/2008  . ALLERGIC RHINITIS 08/06/2007  . ELEVATED PROSTATE SPECIFIC ANTIGEN 08/06/2007  . Hyperlipidemia 04/22/2007  . Hereditary and idiopathic peripheral neuropathy 04/22/2007  . UPPER RESPIRATORY INFECTION (URI) 04/22/2007  . COPD exacerbation 04/22/2007  . Osteoarthritis 04/22/2007  . LOW BACK PAIN 04/22/2007   Delrae Sawyers, PT,DPT,NCS 22-Aug-2014 5:06 PM Phone [phone] FAX [phone]         [facility] [address] [place], [place], [address] Phone: [phone]   Fax:  [phone]

## 2014-08-13 MED ORDER — CLOPIDOGREL BISULFATE 75 MG PO TABS: 75.0000 mg | ORAL_TABLET | Freq: Every day | ORAL | Status: DC

## 2014-08-13 NOTE — Telephone Encounter (Signed)
I spoke with pt and told him Dr. Angelena Form would like him to take Clopidogrel 75 mg daily. [facility]

## 2014-08-13 NOTE — Telephone Encounter (Signed)
Home phone busy on 2 attempts this AM.  Left message on pt's cell phone to call back

## 2014-08-13 NOTE — Telephone Encounter (Signed)
[name], He should be on Plavix 75 mg daily. Thanks, chris

## 2014-08-16 ENCOUNTER — Ambulatory Visit: Payer: Medicare Other | Admitting: Rehabilitative and Restorative Service Providers"

## 2014-08-16 DIAGNOSIS — R269 Unspecified abnormalities of gait and mobility: Secondary | ICD-10-CM | POA: Diagnosis not present

## 2014-08-16 DIAGNOSIS — R293 Abnormal posture: Secondary | ICD-10-CM | POA: Diagnosis not present

## 2014-08-16 DIAGNOSIS — R279 Unspecified lack of coordination: Secondary | ICD-10-CM | POA: Diagnosis not present

## 2014-08-16 NOTE — Patient Instructions (Signed)
Heel Cord Stretch   Place one leg forward, bent, other leg behind and straight. Lean forward keeping back heel flat. Hold 20 seconds while counting out loud. Repeat with other leg. Repeat __2__ times. Do __2__ sessions per day.  http://gt2.exer.us/511   Copyright  VHI. All rights reserved.  Long Arc Knee Extension   Straighten knee- POINT AND FLEX FOOT 10 TIMES. Repeat _1__ time ON EACH LEG. Do _2__ times a day.   Copyright  VHI. All rights reserved.   Mini Squat: Double Leg   HOLD A COUNTERTOP OR WALKER With feet shoulder width apart, reach forward for balance and do a mini squat. Keep knees in line with second toe. Knees do not go past toes. Repeat _10__ times per set.  Do _2__ sets per session.  http://plyo.exer.us/70   Copyright  VHI. All rights reserved.   Gaze Stabilization: Tip Card 1.Target must remain in focus, not blurry, and appear stationary while head is in motion. 2.Perform exercises with small head movements (45 to either side of midline). 3.Increase speed of head motion so long as target is in focus. 4.If you wear eyeglasses, be sure you can see target through lens (therapist will give specific instructions for bifocal / progressive lenses). 5.These exercises may provoke dizziness or nausea. Work through these symptoms. If too dizzy, slow head movement slightly. Rest between each exercise. 6.Exercises demand concentration; avoid distractions. 7.For safety, perform standing exercises close to a counter, wall, corner, or next to someone.  Copyright  VHI. All rights reserved.  Gaze Stabilization: Sitting   Keeping eyes on target on wall 3 feet away, tilt head down slightly and move head side to side for __30__ seconds.  Do _2___ sessions per day.  Copyright  VHI. All rights reserved.

## 2014-08-16 NOTE — Therapy (Signed)
[facility] [address] [place], [place], [address] Phone: [phone]   Fax:  [phone]  Physical Therapy Treatment  Patient Details  Name: [name] MRN: [id number] Date of Birth: 07-Jul-1940 Referring Provider:  Cassandria Anger, MD  Encounter Date: 08/16/2014      PT End of Session - 08/16/14 0916    Visit Number 2   Number of Visits 9   Date for PT Re-Evaluation 09/24/14   Authorization Type Medicare G codes required   PT Start Time 0804   PT Stop Time 0846   PT Time Calculation (min) 42 min   Equipment Utilized During Treatment Gait belt   Activity Tolerance Patient tolerated treatment well   Behavior During Therapy [facility] for tasks assessed/performed      Past Medical History  Diagnosis Date  . CAD (coronary artery disease)     CABG x 2 with LIMA to Circumflex and vein graft to PDA  07/13/1991  . History of transient ischemic attack (TIA)   . Hyperlipidemia   . Peripheral neuropathy   . PVD (peripheral vascular disease) with claudication   . Carotid bruit     bilateral  . Ventral hernia   . Personal history of prostate cancer   . Diverticulosis   . ALLERGIC RHINITIS   . Osteoarthritis   . COPD (chronic obstructive pulmonary disease)   . Tobacco use disorder, continuous   . STEMI (ST elevation myocardial infarction) 04/29/2014    PTCA Lmain and CFX, in setting of VF/VT arrest and CGS    Past Surgical History  Procedure Laterality Date  . Coronary artery bypass graft  1992  . Lumbar laminectomy  [id number]     X2  . Prostatectomy  05/2008    Dr. Alinda Money and XRT  . Appendectomy    . Tonsillectomy    . Left heart catheterization with coronary angiogram N/A 04/30/2014    Procedure: LEFT HEART CATHETERIZATION WITH CORONARY ANGIOGRAM;  Surgeon: Peter M Martinique, MD; Lmain 90% (s/p PTCA), LAD 80%, CFX 100% (s/p PTCA), RCA OK, LIMA-LAD atretic, SVG-OM 100% (chronic), EF 45%, pt req defib x 13  for VF/VT arrest, CHB w/ tem pacer, IABP and intubation  . Coronary artery bypass graft N/A 05/28/2014    Procedure: REDO CORONARY ARTERY BYPASS GRAFTING (CABG);  Surgeon: Grace Isaac, MD;  Location: [place];  Service: Open Heart Surgery;  Laterality: N/A;  Times 4 using right internal mammary artery to Intermediate artery and endoscopically harvested left saphenous vein to LAD, OM1, and PD coronary arteries.  Darden Dates without cardioversion N/A 05/28/2014    Procedure: TRANSESOPHAGEAL ECHOCARDIOGRAM (TEE);  Surgeon: Grace Isaac, MD;  Location: [place];  Service: Open Heart Surgery;  Laterality: N/A;  . Tee without cardioversion N/A 06/24/2014    Procedure: TRANSESOPHAGEAL ECHOCARDIOGRAM (TEE);  Surgeon: Pixie Casino, MD;  Location: [place];  Service: Cardiovascular;  Laterality: N/A;  . Cardioversion N/A 06/24/2014    Procedure: CARDIOVERSION;  Surgeon: Pixie Casino, MD;  Location: [facility] ENDOSCOPY;  Service: Cardiovascular;  Laterality: N/A;    There were no vitals filed for this visit.  Visit Diagnosis:  Abnormality of gait      Subjective Assessment - 08/16/14 0805    Subjective Patient reports frequent tripping due to foot drop from h/o back surgery.  He is using SPC in the home.   Currently in Pain? Yes   Pain Score --  Constant 6/10 back pain, R shoulder blade  Effect of Pain on Daily Activities *PT will monitor response to treatment, but not focus of treatment due to nature of referral.            [facility] PT Assessment - 08/16/14 0812    Standardized Balance Assessment   Standardized Balance Assessment Dynamic Gait Index   Dynamic Gait Index   Level Surface Mild Impairment   Change in Gait Speed Moderate Impairment   Gait with Horizontal Head Turns Moderate Impairment   Gait with Vertical Head Turns Moderate Impairment   Gait and Pivot Turn Mild Impairment   Step Over Obstacle Mild Impairment   Step Around Obstacles Mild Impairment   Steps Mild Impairment    Total Score 13             [facility] Adult PT Treatment/Exercise - 08/16/14 0812    Ambulation/Gait   Ambulation/Gait Yes   Ambulation/Gait Assistance 5: Supervision   Ambulation Distance (Feet) 300 Feet   Assistive device Straight cane   Gait Pattern Poor foot clearance - left;Poor foot clearance - right  frequent loss of balance with stepping to recover   Ambulation Surface Level   High Level Balance   High Level Balance Activities Side stepping;Braiding;Marching forwards;Tandem walking;Direction changes;Backward walking  with CGA to min A near counter + heel/toe walking   Exercises   Exercises Ankle;Knee/Hip;Lumbar   Lumbar Exercises: Stretches   Active Hamstring Stretch 10 seconds  seated long arc quad with hold for hamstring stretch   Passive Hamstring Stretch 20 seconds  supine position R and L sides   Lumbar Exercises: Standing   Functional Squats 10 reps  mini squat with UE support   Lumbar Exercises: Seated   Sit to Stand --  x 2 reps, provokes lightheadedness   Lumbar Exercises: Supine   Other Supine Lumbar Exercises towel roll stretch supine    Other Supine Lumbar Exercises lumbar marching x 10 reps   Knee/Hip Exercises: Stretches   Gastroc Stretch Limitations --  standing x 20 seconds R and L x 2 repetitions           PT Education - 08/16/14 0849    Education provided Yes   Education Details HEP: heel cord stretch, hamstring/heel cord stretch, mini squats, gaze x 1 viewing   Person(s) Educated Patient   Methods Explanation;Demonstration;Handout   Comprehension Verbalized understanding;Returned demonstration             PT Long Term Goals - 08/16/14 7741    PT LONG TERM GOAL #1   Title Demonstrate correct performance of HEP to address balance impairment. Target 09/09/14   Status On-going   PT LONG TERM GOAL #2   Title Demonstrate ability to turn to look over left and right shoulder without unsteadiness (as in [place]). Target 09/09/14   Status On-going    PT LONG TERM GOAL #3   Title Complete DGI and write appropriate goal: Improve from 13/24 up to 18/24 to demo decreasing risk for falls. Target 09/09/14   Status Revised   PT LONG TERM GOAL #4   Title Demonstrate ability to rise from sci fit and turn left without loss of balance; and to transfer on/off of elliptical without loss of balance for increased readiness for cardiac rehab. Target 09/09/14   PT LONG TERM GOAL #5   Title Verbalize understanding of fall prevention strategies in home environment. Target 09/09/14               Plan - 08/16/14 0916    Clinical  Impression Statement The patient tolerated HEP well with frequent rest breaks due to back pain.  Back pain worse when ambulating without SPC.  PT to progress to patient tolerance, working towards [place].   PT Next Visit Plan HEP, turns, figure 8, progress HEP, LE strength   Consulted and Agree with Plan of Care Patient        Problem List Patient Active Problem List   Diagnosis Date Noted  . Hypokalemia 06/29/2014  . PVD (peripheral vascular disease) with claudication   . COPD (chronic obstructive pulmonary disease)   . Tobacco use disorder, continuous   . Hyponatremia   . Nausea with vomiting   . Orthostatic hypotension 06/22/2014  . Dehydration 06/22/2014  . Atrial flutter with rapid ventricular response 06/22/2014  . Pain in joint, shoulder region 06/14/2014  . Acute blood loss anemia 06/09/2014  . Hypertensive heart disease 06/09/2014  . Hematuria 05/31/2014  . S/P CABG x 4 05/28/2014  . PAF- on Amiodarone 05/25/2014  . Chronic anticoagulation-on Eliquis at discharge 05/11/14 05/25/2014  . ICM-EF 45-50% by echo 04/30/14 05/25/2014  . Protein-calorie malnutrition, severe 05/25/2014  . Encephalopathy 05/14/2014  . Acute respiratory acidosis   . S/P CABG x 2 05/04/2014  . Acute respiratory failure   . STEMI 05/04/14 with shock, CPR, CHB, VT 04/30/2014  . Cardiogenic shock 04/30/2014  . Cardiac arrest 04/30/2014   . VT (ventricular tachycardia) 04/30/2014  . CHB (complete heart block) 04/30/2014  . CAD S/P LM and CFX PCI 05/04/14 04/30/2014  . Wart viral 04/18/2014  . Laceration of index finger 12/04/2012  . Hand eczema 05/26/2012  . Bruising 02/20/2012  . Actinic keratoses 11/14/2011  . PAD (peripheral artery disease) 08/28/2011  . Constipation 07/18/2011  . Sinusitis acute 04/18/2011  . Ventral hernia 01/03/2011  . SWEATING 06/26/2010  . History of transient ischemic attack (TIA)   . CERUMEN IMPACTION 02/20/2010  . DIVERTICULOSIS-COLON 11/01/2009  . RECTAL BLEEDING 11/01/2009  . ABDOMINAL BLOATING 11/01/2009  . ABNORMAL BOWEL SOUNDS 11/01/2009  . ABDOMINAL PAIN-RLQ 11/01/2009  . ABDOMINAL PAIN-LLQ 11/01/2009  . ABDOMINAL PAIN, GENERALIZED 11/01/2009  . Rash and nonspecific skin eruption 07/11/2009  . TRANSIENT ISCHEMIC ATTACK, HX OF 03/22/2009  . PVD WITH CLAUDICATION 03/14/2009  . BRONCHITIS, ACUTE 11/26/2008  . Personal history of malignant neoplasm of prostate 08/02/2008  . ALLERGIC RHINITIS 08/06/2007  . ELEVATED PROSTATE SPECIFIC ANTIGEN 08/06/2007  . Hyperlipidemia 04/22/2007  . Hereditary and idiopathic peripheral neuropathy 04/22/2007  . UPPER RESPIRATORY INFECTION (URI) 04/22/2007  . COPD exacerbation 04/22/2007  . Osteoarthritis 04/22/2007  . LOW BACK PAIN 04/22/2007    Lukka Black, PT 08/16/2014, 9:19 AM  [facility] [address] [place], [place], [address] Phone: [phone]   Fax:  [phone]

## 2014-08-17 ENCOUNTER — Encounter: Payer: Medicare Other | Admitting: Physical Therapy

## 2014-08-18 ENCOUNTER — Ambulatory Visit: Payer: Self-pay | Admitting: Cardiovascular Disease

## 2014-08-18 ENCOUNTER — Ambulatory Visit (HOSPITAL_COMMUNITY): Payer: Medicare Other

## 2014-08-19 ENCOUNTER — Ambulatory Visit: Payer: Medicare Other | Admitting: Physical Therapy

## 2014-08-19 DIAGNOSIS — R279 Unspecified lack of coordination: Secondary | ICD-10-CM | POA: Diagnosis not present

## 2014-08-19 DIAGNOSIS — R269 Unspecified abnormalities of gait and mobility: Secondary | ICD-10-CM | POA: Diagnosis not present

## 2014-08-19 DIAGNOSIS — R2689 Other abnormalities of gait and mobility: Secondary | ICD-10-CM

## 2014-08-19 DIAGNOSIS — R293 Abnormal posture: Secondary | ICD-10-CM | POA: Diagnosis not present

## 2014-08-20 ENCOUNTER — Encounter: Payer: Self-pay | Admitting: Physical Therapy

## 2014-08-20 NOTE — Therapy (Signed)
[facility] [address] [place], [place], [address] Phone: [phone]   Fax:  [phone]  Physical Therapy Treatment  Patient Details  Name: [name] MRN: [id number] Date of Birth: November 20, 1940 Referring Provider:  Cassandria Anger, MD  Encounter Date: 08/19/2014      PT End of Session - 08/20/14 1224    Visit Number 3  G3   Number of Visits 9   Date for PT Re-Evaluation 09/24/14   Authorization Type Medicare G codes required   PT Start Time 0759   PT Stop Time 0845   PT Time Calculation (min) 46 min   Equipment Utilized During Treatment Gait belt      Past Medical History  Diagnosis Date  . CAD (coronary artery disease)     CABG x 2 with LIMA to Circumflex and vein graft to PDA  07/13/1991  . History of transient ischemic attack (TIA)   . Hyperlipidemia   . Peripheral neuropathy   . PVD (peripheral vascular disease) with claudication   . Carotid bruit     bilateral  . Ventral hernia   . Personal history of prostate cancer   . Diverticulosis   . ALLERGIC RHINITIS   . Osteoarthritis   . COPD (chronic obstructive pulmonary disease)   . Tobacco use disorder, continuous   . STEMI (ST elevation myocardial infarction) 04/29/2014    PTCA Lmain and CFX, in setting of VF/VT arrest and CGS    Past Surgical History  Procedure Laterality Date  . Coronary artery bypass graft  1992  . Lumbar laminectomy  [id number]     X2  . Prostatectomy  05/2008    Dr. Alinda Money and XRT  . Appendectomy    . Tonsillectomy    . Left heart catheterization with coronary angiogram N/A 04/30/2014    Procedure: LEFT HEART CATHETERIZATION WITH CORONARY ANGIOGRAM;  Surgeon: Peter M Martinique, MD; Lmain 90% (s/p PTCA), LAD 80%, CFX 100% (s/p PTCA), RCA OK, LIMA-LAD atretic, SVG-OM 100% (chronic), EF 45%, pt req defib x 13 for VF/VT arrest, CHB w/ tem pacer, IABP and intubation  . Coronary artery bypass graft N/A 05/28/2014     Procedure: REDO CORONARY ARTERY BYPASS GRAFTING (CABG);  Surgeon: Grace Isaac, MD;  Location: [place];  Service: Open Heart Surgery;  Laterality: N/A;  Times 4 using right internal mammary artery to Intermediate artery and endoscopically harvested left saphenous vein to LAD, OM1, and PD coronary arteries.  Darden Dates without cardioversion N/A 05/28/2014    Procedure: TRANSESOPHAGEAL ECHOCARDIOGRAM (TEE);  Surgeon: Grace Isaac, MD;  Location: [place];  Service: Open Heart Surgery;  Laterality: N/A;  . Tee without cardioversion N/A 06/24/2014    Procedure: TRANSESOPHAGEAL ECHOCARDIOGRAM (TEE);  Surgeon: Pixie Casino, MD;  Location: [place];  Service: Cardiovascular;  Laterality: N/A;  . Cardioversion N/A 06/24/2014    Procedure: CARDIOVERSION;  Surgeon: Pixie Casino, MD;  Location: [facility] ENDOSCOPY;  Service: Cardiovascular;  Laterality: N/A;    There were no vitals filed for this visit.  Visit Diagnosis:  Abnormality of gait  Balance problem      Subjective Assessment - 08/20/14 1221    Subjective Pt. reports no recent occurrences of tripping due to incomplete foot clearance - states that today seems to be a good day; pt. using a rollator - says he has the seat to be able to rest in a seated position when needed   Pertinent History bilateral foot drop, hx of  lumbar spinal surgery,    Patient Stated Goals increase balance in order to participate in cardiac rehab   Currently in Pain? Yes   Pain Score 6                          [facility] Adult PT Treatment/Exercise - 08/20/14 0001    Ambulation/Gait   Ambulation/Gait Yes   Ambulation/Gait Assistance 5: Supervision   Ambulation/Gait Assistance Details no foot drop ntoed   Ambulation Distance (Feet) 360 Feet   Assistive device Straight cane   Gait Pattern Within Functional Limits   Ambulation Surface Level;Indoor   Exercises   Exercises Ankle;Knee/Hip;Lumbar   Lumbar Exercises: Stretches   Active Hamstring Stretch 2  reps;20 seconds   Lumbar Exercises: Aerobic   Stationary Bike Scifit level 2.0 x 5" with UE's and LE's   Lumbar Exercises: Seated   Sit to Stand 10 reps  with no UE support     NeuroRe-ed:  Single limb stance activities - alternate tap ups to 4" step with CGA inside bars; sidestepping, crossovers Front and back 10' 4 reps inside bars; stepping over and back of 1/2 bolster x 10 reps each leg; rockerboard anterior/ posteriorly x 20 reps inside bars; braiding 10' x 2 reps inside bars with CGA sit to stand  TherEx; Scifit level 2.0 x 5" with UE's and LE's  Palpated pulse at 61 bpm due to unable to obtain BP reading with machine due to arrythmia - pulse oximeter readings Fluctuated betwee 44 -102 bpm            PT Long Term Goals - 08/16/14 8832    PT LONG TERM GOAL #1   Title Demonstrate correct performance of HEP to address balance impairment. Target 09/09/14   Status On-going   PT LONG TERM GOAL #2   Title Demonstrate ability to turn to look over left and right shoulder without unsteadiness (as in [place]). Target 09/09/14   Status On-going   PT LONG TERM GOAL #3   Title Complete DGI and write appropriate goal: Improve from 13/24 up to 18/24 to demo decreasing risk for falls. Target 09/09/14   Status Revised   PT LONG TERM GOAL #4   Title Demonstrate ability to rise from sci fit and turn left without loss of balance; and to transfer on/off of elliptical without loss of balance for increased readiness for cardiac rehab. Target 09/09/14   PT LONG TERM GOAL #5   Title Verbalize understanding of fall prevention strategies in home environment. Target 09/09/14               Plan - 08/20/14 1224    Clinical Impression Statement No occurrences today of incomplete foot clearance during ambulation with cane - balance was good during gait training with cane; HR was fluctuating significantly, however, pt reported no symptoms and stated he felt fine during therapy;    Pt will benefit from  skilled therapeutic intervention in order to improve on the following deficits Abnormal gait;Decreased coordination;Postural dysfunction;Decreased balance   Rehab Potential Good   PT Frequency 2x / week   PT Duration 4 weeks   PT Next Visit Plan progress HEP to include some balance exercises   Consulted and Agree with Plan of Care Patient        Problem List Patient Active Problem List   Diagnosis Date Noted  . Hypokalemia 06/29/2014  . PVD (peripheral vascular disease) with claudication   . COPD (chronic obstructive  pulmonary disease)   . Tobacco use disorder, continuous   . Hyponatremia   . Nausea with vomiting   . Orthostatic hypotension 06/22/2014  . Dehydration 06/22/2014  . Atrial flutter with rapid ventricular response 06/22/2014  . Pain in joint, shoulder region 06/14/2014  . Acute blood loss anemia 06/09/2014  . Hypertensive heart disease 06/09/2014  . Hematuria 05/31/2014  . S/P CABG x 4 05/28/2014  . PAF- on Amiodarone 05/25/2014  . Chronic anticoagulation-on Eliquis at discharge 05/11/14 05/25/2014  . ICM-EF 45-50% by echo 04/30/14 05/25/2014  . Protein-calorie malnutrition, severe 05/25/2014  . Encephalopathy 05/14/2014  . Acute respiratory acidosis   . S/P CABG x 2 05/04/2014  . Acute respiratory failure   . STEMI 05/04/14 with shock, CPR, CHB, VT 04/30/2014  . Cardiogenic shock 04/30/2014  . Cardiac arrest 04/30/2014  . VT (ventricular tachycardia) 04/30/2014  . CHB (complete heart block) 04/30/2014  . CAD S/P LM and CFX PCI 05/04/14 04/30/2014  . Wart viral 04/18/2014  . Laceration of index finger 12/04/2012  . Hand eczema 05/26/2012  . Bruising 02/20/2012  . Actinic keratoses 11/14/2011  . PAD (peripheral artery disease) 08/28/2011  . Constipation 07/18/2011  . Sinusitis acute 04/18/2011  . Ventral hernia 01/03/2011  . SWEATING 06/26/2010  . History of transient ischemic attack (TIA)   . CERUMEN IMPACTION 02/20/2010  . DIVERTICULOSIS-COLON 11/01/2009   . RECTAL BLEEDING 11/01/2009  . ABDOMINAL BLOATING 11/01/2009  . ABNORMAL BOWEL SOUNDS 11/01/2009  . ABDOMINAL PAIN-RLQ 11/01/2009  . ABDOMINAL PAIN-LLQ 11/01/2009  . ABDOMINAL PAIN, GENERALIZED 11/01/2009  . Rash and nonspecific skin eruption 07/11/2009  . TRANSIENT ISCHEMIC ATTACK, HX OF 03/22/2009  . PVD WITH CLAUDICATION 03/14/2009  . BRONCHITIS, ACUTE 11/26/2008  . Personal history of malignant neoplasm of prostate 08/02/2008  . ALLERGIC RHINITIS 08/06/2007  . ELEVATED PROSTATE SPECIFIC ANTIGEN 08/06/2007  . Hyperlipidemia 04/22/2007  . Hereditary and idiopathic peripheral neuropathy 04/22/2007  . UPPER RESPIRATORY INFECTION (URI) 04/22/2007  . COPD exacerbation 04/22/2007  . Osteoarthritis 04/22/2007  . LOW BACK PAIN 04/22/2007    Alda Lea, PT 08/20/2014, 5:39 PM  [facility] [address] [place], [place], [address] Phone: [phone]   Fax:  [phone]

## 2014-08-23 ENCOUNTER — Ambulatory Visit (HOSPITAL_COMMUNITY): Payer: Medicare Other

## 2014-08-24 ENCOUNTER — Ambulatory Visit: Payer: Medicare Other

## 2014-08-25 ENCOUNTER — Ambulatory Visit (HOSPITAL_COMMUNITY): Payer: Medicare Other

## 2014-08-25 ENCOUNTER — Ambulatory Visit: Payer: Medicare Other

## 2014-08-25 VITALS — BP 118/66 | HR 68

## 2014-08-25 DIAGNOSIS — R269 Unspecified abnormalities of gait and mobility: Secondary | ICD-10-CM | POA: Diagnosis not present

## 2014-08-25 DIAGNOSIS — R279 Unspecified lack of coordination: Secondary | ICD-10-CM

## 2014-08-25 DIAGNOSIS — R2689 Other abnormalities of gait and mobility: Secondary | ICD-10-CM

## 2014-08-25 DIAGNOSIS — R293 Abnormal posture: Secondary | ICD-10-CM | POA: Diagnosis not present

## 2014-08-25 NOTE — Therapy (Signed)
[facility] [address] [place], [place], [address] Phone: [phone]   Fax:  [phone]  Physical Therapy Treatment  Patient Details  Name: [name] MRN: [id number] Date of Birth: 1940/06/22 Referring Provider:  Cassandria Anger, MD  Encounter Date: 08/25/2014      PT End of Session - 08/25/14 1214    Visit Number 4   Number of Visits 9   Date for PT Re-Evaluation 09/24/14   Authorization Type Medicare G codes required   PT Start Time 0923   PT Stop Time 0931   PT Time Calculation (min) 39 min      Past Medical History  Diagnosis Date  . CAD (coronary artery disease)     CABG x 2 with LIMA to Circumflex and vein graft to PDA  07/13/1991  . History of transient ischemic attack (TIA)   . Hyperlipidemia   . Peripheral neuropathy   . PVD (peripheral vascular disease) with claudication   . Carotid bruit     bilateral  . Ventral hernia   . Personal history of prostate cancer   . Diverticulosis   . ALLERGIC RHINITIS   . Osteoarthritis   . COPD (chronic obstructive pulmonary disease)   . Tobacco use disorder, continuous   . STEMI (ST elevation myocardial infarction) 04/29/2014    PTCA Lmain and CFX, in setting of VF/VT arrest and CGS    Past Surgical History  Procedure Laterality Date  . Coronary artery bypass graft  1992  . Lumbar laminectomy  [id number]     X2  . Prostatectomy  05/2008    Dr. Alinda Money and XRT  . Appendectomy    . Tonsillectomy    . Left heart catheterization with coronary angiogram N/A 04/30/2014    Procedure: LEFT HEART CATHETERIZATION WITH CORONARY ANGIOGRAM;  Surgeon: Peter M Martinique, MD; Lmain 90% (s/p PTCA), LAD 80%, CFX 100% (s/p PTCA), RCA OK, LIMA-LAD atretic, SVG-OM 100% (chronic), EF 45%, pt req defib x 13 for VF/VT arrest, CHB w/ tem pacer, IABP and intubation  . Coronary artery bypass graft N/A 05/28/2014    Procedure: REDO CORONARY ARTERY BYPASS GRAFTING (CABG);   Surgeon: Grace Isaac, MD;  Location: [facility];  Service: Open Heart Surgery;  Laterality: N/A;  Times 4 using right internal mammary artery to Intermediate artery and endoscopically harvested left saphenous vein to LAD, OM1, and PD coronary arteries.  Darden Dates without cardioversion N/A 05/28/2014    Procedure: TRANSESOPHAGEAL ECHOCARDIOGRAM (TEE);  Surgeon: Grace Isaac, MD;  Location: [facility];  Service: Open Heart Surgery;  Laterality: N/A;  . Tee without cardioversion N/A 06/24/2014    Procedure: TRANSESOPHAGEAL ECHOCARDIOGRAM (TEE);  Surgeon: Pixie Casino, MD;  Location[facility] ENDOSCOPY;  Service: Cardiovascular;  Laterality: N/A;  . Cardioversion N/A 06/24/2014    Procedure: CARDIOVERSION;  Surgeon: Pixie Casino, MD;  Location: [facility] ENDOSCOPY;  Service: Cardiovascular;  Laterality: N/A;    Filed Vitals:   08/25/14 0857  BP: 118/66  Pulse: 68    Visit Diagnosis:  Abnormality of gait  Balance problem  Lack of coordination      Subjective Assessment - 08/25/14 0857    Subjective Pt reports increased back pain today compared to typical   Currently in Pain? Yes   Pain Score 7    Pain Location --  back, hips, R shoulder and arm   Pain Onset --  28 years ago   Pain Frequency Intermittent  Gait training x500' with single point cane and close supervision with 2 episodes of MIN A to steady on one occasion with knee buckling and scissoring  Neuro re-ed 5 laps of each of the following at counter: brading Figure 8 walking High knee figure 8 walking Braiding with fingertip support and eyes closed  3 laps of weaving through cones at counter without UE support 2 laps weaving through cones with alternate foot tap with CGA and 3 losses of balance requiring UE support to steady                            PT Education - 08/25/14 0932    Education provided Yes   Education Details HEP for balance   Person(s) Educated Patient   Methods  Explanation;Demonstration;Handout   Comprehension Verbalized understanding;Returned demonstration             PT Long Term Goals - 08/16/14 0918    PT LONG TERM GOAL #1   Title Demonstrate correct performance of HEP to address balance impairment. Target 09/09/14   Status On-going   PT LONG TERM GOAL #2   Title Demonstrate ability to turn to look over left and right shoulder without unsteadiness (as in [place]). Target 09/09/14   Status On-going   PT LONG TERM GOAL #3   Title Complete DGI and write appropriate goal: Improve from 13/24 up to 18/24 to demo decreasing risk for falls. Target 09/09/14   Status Revised   PT LONG TERM GOAL #4   Title Demonstrate ability to rise from sci fit and turn left without loss of balance; and to transfer on/off of elliptical without loss of balance for increased readiness for cardiac rehab. Target 09/09/14   PT LONG TERM GOAL #5   Title Verbalize understanding of fall prevention strategies in home environment. Target 09/09/14               Plan - 08/25/14 1214    Clinical Impression Statement Pt making progress with balance and safety with walking with cane. Continue per plan of care. He had a brief period of dizziness but vitals were stable.   PT Next Visit Plan Continue with balance/gait training        Problem List Patient Active Problem List   Diagnosis Date Noted  . Hypokalemia 06/29/2014  . PVD (peripheral vascular disease) with claudication   . COPD (chronic obstructive pulmonary disease)   . Tobacco use disorder, continuous   . Hyponatremia   . Nausea with vomiting   . Orthostatic hypotension 06/22/2014  . Dehydration 06/22/2014  . Atrial flutter with rapid ventricular response 06/22/2014  . Pain in joint, shoulder region 06/14/2014  . Acute blood loss anemia 06/09/2014  . Hypertensive heart disease 06/09/2014  . Hematuria 05/31/2014  . S/P CABG x 4 05/28/2014  . PAF- on Amiodarone 05/25/2014  . Chronic anticoagulation-on Eliquis  at discharge 05/11/14 05/25/2014  . ICM-EF 45-50% by echo 04/30/14 05/25/2014  . Protein-calorie malnutrition, severe 05/25/2014  . Encephalopathy 05/14/2014  . Acute respiratory acidosis   . S/P CABG x 2 05/04/2014  . Acute respiratory failure   . STEMI 05/04/14 with shock, CPR, CHB, VT 04/30/2014  . Cardiogenic shock 04/30/2014  . Cardiac arrest 04/30/2014  . VT (ventricular tachycardia) 04/30/2014  . CHB (complete heart block) 04/30/2014  . CAD S/P LM and CFX PCI 05/04/14 04/30/2014  . Wart viral 04/18/2014  . Laceration of index finger 12/04/2012  . Hand  eczema 05/26/2012  . Bruising 02/20/2012  . Actinic keratoses 11/14/2011  . PAD (peripheral artery disease) 08/28/2011  . Constipation 07/18/2011  . Sinusitis acute 04/18/2011  . Ventral hernia 01/03/2011  . SWEATING 06/26/2010  . History of transient ischemic attack (TIA)   . CERUMEN IMPACTION 02/20/2010  . DIVERTICULOSIS-COLON 11/01/2009  . RECTAL BLEEDING 11/01/2009  . ABDOMINAL BLOATING 11/01/2009  . ABNORMAL BOWEL SOUNDS 11/01/2009  . ABDOMINAL PAIN-RLQ 11/01/2009  . ABDOMINAL PAIN-LLQ 11/01/2009  . ABDOMINAL PAIN, GENERALIZED 11/01/2009  . Rash and nonspecific skin eruption 07/11/2009  . TRANSIENT ISCHEMIC ATTACK, HX OF 03/22/2009  . PVD WITH CLAUDICATION 03/14/2009  . BRONCHITIS, ACUTE 11/26/2008  . Personal history of malignant neoplasm of prostate 08/02/2008  . ALLERGIC RHINITIS 08/06/2007  . ELEVATED PROSTATE SPECIFIC ANTIGEN 08/06/2007  . Hyperlipidemia 04/22/2007  . Hereditary and idiopathic peripheral neuropathy 04/22/2007  . UPPER RESPIRATORY INFECTION (URI) 04/22/2007  . COPD exacerbation 04/22/2007  . Osteoarthritis 04/22/2007  . LOW BACK PAIN 04/22/2007   Delrae Sawyers, PT,DPT,NCS 08/25/2014 12:16 PM Phone [phone] FAX [phone]         [facility] [address] [place], [place], [address] Phone: [phone]   Fax:   [phone]

## 2014-08-25 NOTE — Patient Instructions (Signed)
Braiding-eyes closed   Hold onto counter with fingertips. Close eyes. Move to side: 1) cross right leg in front of left, 2) bring back leg out to side, then 3) cross right leg behind left, 4) bring left leg out to side. Continue sequence in same direction. Reverse sequence, moving in opposite direction when you get to the end of the counter. Do 3 laps daily.  Copyright  VHI. All rights reserved.    Marching Figure Eight   Practice marching in figure eight. Start with large figures. Make it more difficult by walking in a smaller figure eight. Repeat 10x times. Do 1 sessions per day.  http://gt2.exer.us/537   Copyright  VHI. All rights reserved.

## 2014-08-27 ENCOUNTER — Encounter: Payer: Medicare Other | Admitting: Physical Therapy

## 2014-08-30 ENCOUNTER — Ambulatory Visit (HOSPITAL_COMMUNITY): Payer: Medicare Other

## 2014-08-30 ENCOUNTER — Ambulatory Visit: Payer: Medicare Other | Admitting: Physical Therapy

## 2014-08-30 ENCOUNTER — Encounter: Payer: Self-pay | Admitting: Physical Therapy

## 2014-08-30 VITALS — BP 118/72 | HR 84

## 2014-08-30 DIAGNOSIS — R2689 Other abnormalities of gait and mobility: Secondary | ICD-10-CM

## 2014-08-30 DIAGNOSIS — R269 Unspecified abnormalities of gait and mobility: Secondary | ICD-10-CM | POA: Diagnosis not present

## 2014-08-30 DIAGNOSIS — R293 Abnormal posture: Secondary | ICD-10-CM | POA: Diagnosis not present

## 2014-08-30 DIAGNOSIS — R279 Unspecified lack of coordination: Secondary | ICD-10-CM | POA: Diagnosis not present

## 2014-08-30 NOTE — Therapy (Signed)
[facility] [address] [place], [place], [address] Phone: [phone]   Fax:  [phone]  Physical Therapy Treatment  Patient Details  Name: [name] MRN: [id number] Date of Birth: 1941/02/26 Referring Provider:  Cassandria Anger, MD  Encounter Date: 08/30/2014      PT End of Session - 08/30/14 1302    Visit Number 5   Number of Visits 9   Date for PT Re-Evaluation 09/24/14   Authorization Type Medicare G codes required   PT Start Time 0848   PT Stop Time 0933   PT Time Calculation (min) 45 min   Equipment Utilized During Treatment Gait belt      Past Medical History  Diagnosis Date  . CAD (coronary artery disease)     CABG x 2 with LIMA to Circumflex and vein graft to PDA  07/13/1991  . History of transient ischemic attack (TIA)   . Hyperlipidemia   . Peripheral neuropathy   . PVD (peripheral vascular disease) with claudication   . Carotid bruit     bilateral  . Ventral hernia   . Personal history of prostate cancer   . Diverticulosis   . ALLERGIC RHINITIS   . Osteoarthritis   . COPD (chronic obstructive pulmonary disease)   . Tobacco use disorder, continuous   . STEMI (ST elevation myocardial infarction) 04/29/2014    PTCA Lmain and CFX, in setting of VF/VT arrest and CGS    Past Surgical History  Procedure Laterality Date  . Coronary artery bypass graft  1992  . Lumbar laminectomy  [id number]     X2  . Prostatectomy  05/2008    Dr. Alinda Money and XRT  . Appendectomy    . Tonsillectomy    . Left heart catheterization with coronary angiogram N/A 04/30/2014    Procedure: LEFT HEART CATHETERIZATION WITH CORONARY ANGIOGRAM;  Surgeon: Peter M Martinique, MD; Lmain 90% (s/p PTCA), LAD 80%, CFX 100% (s/p PTCA), RCA OK, LIMA-LAD atretic, SVG-OM 100% (chronic), EF 45%, pt req defib x 13 for VF/VT arrest, CHB w/ tem pacer, IABP and intubation  . Coronary artery bypass graft N/A 05/28/2014     Procedure: REDO CORONARY ARTERY BYPASS GRAFTING (CABG);  Surgeon: Grace Isaac, MD;  Location: [facility];  Service: Open Heart Surgery;  Laterality: N/A;  Times 4 using right internal mammary artery to Intermediate artery and endoscopically harvested left saphenous vein to LAD, OM1, and PD coronary arteries.  Darden Dates without cardioversion N/A 05/28/2014    Procedure: TRANSESOPHAGEAL ECHOCARDIOGRAM (TEE);  Surgeon: Grace Isaac, MD;  Location: [facility];  Service: Open Heart Surgery;  Laterality: N/A;  . Tee without cardioversion N/A 06/24/2014    Procedure: TRANSESOPHAGEAL ECHOCARDIOGRAM (TEE);  Surgeon: Pixie Casino, MD;  Location: [facility];  Service: Cardiovascular;  Laterality: N/A;  . Cardioversion N/A 06/24/2014    Procedure: CARDIOVERSION;  Surgeon: Pixie Casino, MD;  Location: [facility] ENDOSCOPY;  Service: Cardiovascular;  Laterality: N/A;    Filed Vitals:   08/30/14 0855  BP: 118/72  Pulse: 84    Visit Diagnosis:  Abnormality of gait  Balance problem      Subjective Assessment - 08/30/14 1244    Subjective Pt. states he was able to climb steps to get into his choir at church and was able to sing in choir for 1st time since Jan. of this year   Pertinent History bilateral foot drop, hx of lumbar spinal surgery,    Patient Stated Goals  increase balance in order to participate in cardiac rehab   Currently in Pain? Yes   Pain Score 6    Pain Location --  back, hips, R shoulder    Pain Orientation Lower   Pain Descriptors / Indicators Aching;Dull;Discomfort   Pain Type Chronic pain   Pain Onset More than a month ago   Pain Frequency Intermittent  intermittent in intensity                         [facility] Adult PT Treatment/Exercise - 08/30/14 1247    Ambulation/Gait   Ambulation/Gait Yes   Ambulation/Gait Assistance 5: Supervision   Ambulation/Gait Assistance Details no R toe/foot drop noted - no catching of R forefoot but pt did report an occurrence of this  yesterday   Ambulation Distance (Feet) 250 Feet  150' without cane   Assistive device Straight cane   Gait Pattern Within Functional Limits   Ambulation Surface Level;Indoor   Lumbar Exercises: Aerobic   Stationary Bike Scifit level 1.5 x 5" with UE's and LE's   Knee/Hip Exercises: Stretches   Press photographer 1 rep;30 seconds  using Prostretch   Knee/Hip Exercises: Standing   Functional Squat 1 set;10 reps  standing on blue foam     NeuroRe-ed; single limb stance acitivities on compliant surfaces with UE support prn and with CGA; marching on red mat  on incline and on decline x 10 reps each LE with CGA to min assist; Rockerboard with UE support prn with CGA - with horizontal and vertical head turns with UE support Alternate tap ups to 6" step x 10 reps with UE support prn with CGA           PT Education - 08/30/14 1300    Education provided Yes   Education Details added heel cord stretch to HEP (pt states he is doing runner's stretch) - recommended a 3" step/block   Person(s) Educated Patient   Methods Explanation;Demonstration   Comprehension Verbalized understanding;Returned demonstration             PT Long Term Goals - 08/16/14 6546    PT LONG TERM GOAL #1   Title Demonstrate correct performance of HEP to address balance impairment. Target 09/09/14   Status On-going   PT LONG TERM GOAL #2   Title Demonstrate ability to turn to look over left and right shoulder without unsteadiness (as in [place]). Target 09/09/14   Status On-going   PT LONG TERM GOAL #3   Title Complete DGI and write appropriate goal: Improve from 13/24 up to 18/24 to demo decreasing risk for falls. Target 09/09/14   Status Revised   PT LONG TERM GOAL #4   Title Demonstrate ability to rise from sci fit and turn left without loss of balance; and to transfer on/off of elliptical without loss of balance for increased readiness for cardiac rehab. Target 09/09/14   PT LONG TERM GOAL #5   Title Verbalize  understanding of fall prevention strategies in home environment. Target 09/09/14               Plan - 08/30/14 1303    Clinical Impression Statement Pt reported 2 episodes of mild dizziness today - both occurred after doing balance exercises in standing with 1 being during a turn coming out of parallel bars; pt. able to stand and recover in about 4-5 secs; balance appears to be improving with no occurrrences of foot drop noted in PT today  Pt will benefit from skilled therapeutic intervention in order to improve on the following deficits Abnormal gait;Decreased coordination;Postural dysfunction;Decreased balance   Rehab Potential Good   PT Frequency 2x / week   PT Duration 4 weeks   PT Treatment/Interventions ADLs/Self Care Home Management;Therapeutic activities;Patient/family education;Therapeutic exercise;Gait training;Balance training;Stair training;Neuromuscular re-education   PT Next Visit Plan Continue with balance/gait training   Consulted and Agree with Plan of Care Patient        Problem List Patient Active Problem List   Diagnosis Date Noted  . Hypokalemia 06/29/2014  . PVD (peripheral vascular disease) with claudication   . COPD (chronic obstructive pulmonary disease)   . Tobacco use disorder, continuous   . Hyponatremia   . Nausea with vomiting   . Orthostatic hypotension 06/22/2014  . Dehydration 06/22/2014  . Atrial flutter with rapid ventricular response 06/22/2014  . Pain in joint, shoulder region 06/14/2014  . Acute blood loss anemia 06/09/2014  . Hypertensive heart disease 06/09/2014  . Hematuria 05/31/2014  . S/P CABG x 4 05/28/2014  . PAF- on Amiodarone 05/25/2014  . Chronic anticoagulation-on Eliquis at discharge 05/11/14 05/25/2014  . ICM-EF 45-50% by echo 04/30/14 05/25/2014  . Protein-calorie malnutrition, severe 05/25/2014  . Encephalopathy 05/14/2014  . Acute respiratory acidosis   . S/P CABG x 2 05/04/2014  . Acute respiratory failure   . STEMI  05/04/14 with shock, CPR, CHB, VT 04/30/2014  . Cardiogenic shock 04/30/2014  . Cardiac arrest 04/30/2014  . VT (ventricular tachycardia) 04/30/2014  . CHB (complete heart block) 04/30/2014  . CAD S/P LM and CFX PCI 05/04/14 04/30/2014  . Wart viral 04/18/2014  . Laceration of index finger 12/04/2012  . Hand eczema 05/26/2012  . Bruising 02/20/2012  . Actinic keratoses 11/14/2011  . PAD (peripheral artery disease) 08/28/2011  . Constipation 07/18/2011  . Sinusitis acute 04/18/2011  . Ventral hernia 01/03/2011  . SWEATING 06/26/2010  . History of transient ischemic attack (TIA)   . CERUMEN IMPACTION 02/20/2010  . DIVERTICULOSIS-COLON 11/01/2009  . RECTAL BLEEDING 11/01/2009  . ABDOMINAL BLOATING 11/01/2009  . ABNORMAL BOWEL SOUNDS 11/01/2009  . ABDOMINAL PAIN-RLQ 11/01/2009  . ABDOMINAL PAIN-LLQ 11/01/2009  . ABDOMINAL PAIN, GENERALIZED 11/01/2009  . Rash and nonspecific skin eruption 07/11/2009  . TRANSIENT ISCHEMIC ATTACK, HX OF 03/22/2009  . PVD WITH CLAUDICATION 03/14/2009  . BRONCHITIS, ACUTE 11/26/2008  . Personal history of malignant neoplasm of prostate 08/02/2008  . ALLERGIC RHINITIS 08/06/2007  . ELEVATED PROSTATE SPECIFIC ANTIGEN 08/06/2007  . Hyperlipidemia 04/22/2007  . Hereditary and idiopathic peripheral neuropathy 04/22/2007  . UPPER RESPIRATORY INFECTION (URI) 04/22/2007  . COPD exacerbation 04/22/2007  . Osteoarthritis 04/22/2007  . LOW BACK PAIN 04/22/2007    Alda Lea, PT 08/30/2014, 1:07 PM  [facility] [address] [place], [place], [address] Phone: [phone]   Fax:  [phone]

## 2014-09-01 ENCOUNTER — Ambulatory Visit (HOSPITAL_COMMUNITY): Payer: Medicare Other

## 2014-09-01 ENCOUNTER — Ambulatory Visit: Payer: Medicare Other

## 2014-09-01 DIAGNOSIS — R269 Unspecified abnormalities of gait and mobility: Secondary | ICD-10-CM

## 2014-09-01 DIAGNOSIS — R293 Abnormal posture: Secondary | ICD-10-CM | POA: Diagnosis not present

## 2014-09-01 DIAGNOSIS — R279 Unspecified lack of coordination: Secondary | ICD-10-CM

## 2014-09-01 DIAGNOSIS — R2689 Other abnormalities of gait and mobility: Secondary | ICD-10-CM

## 2014-09-01 NOTE — Therapy (Signed)
[facility] [address] [place], [place], [address] Phone: [phone]   Fax:  [phone]  Physical Therapy Treatment  Patient Details  Name: [name] MRN: [id number] Date of Birth: [birth year] Referring Provider:  Cassandria Anger, MD  Encounter Date: 09/01/2014      PT End of Session - 09/01/14 1414    Visit Number 6   Number of Visits 9   Date for PT Re-Evaluation 09/24/14   Authorization Type Medicare G codes required   PT Start Time 9892   PT Stop Time 1315   PT Time Calculation (min) 40 min      Past Medical History  Diagnosis Date  . CAD (coronary artery disease)     CABG x 2 with LIMA to Circumflex and vein graft to PDA  07/13/1991  . History of transient ischemic attack (TIA)   . Hyperlipidemia   . Peripheral neuropathy   . PVD (peripheral vascular disease) with claudication   . Carotid bruit     bilateral  . Ventral hernia   . Personal history of prostate cancer   . Diverticulosis   . ALLERGIC RHINITIS   . Osteoarthritis   . COPD (chronic obstructive pulmonary disease)   . Tobacco use disorder, continuous   . STEMI (ST elevation myocardial infarction) 04/29/2014    PTCA Lmain and CFX, in setting of VF/VT arrest and CGS    Past Surgical History  Procedure Laterality Date  . Coronary artery bypass graft  1992  . Lumbar laminectomy  [id number]     X2  . Prostatectomy  05/2008    Dr. Alinda Money and XRT  . Appendectomy    . Tonsillectomy    . Left heart catheterization with coronary angiogram N/A 04/30/2014    Procedure: LEFT HEART CATHETERIZATION WITH CORONARY ANGIOGRAM;  Surgeon: Peter M Martinique, MD; Lmain 90% (s/p PTCA), LAD 80%, CFX 100% (s/p PTCA), RCA OK, LIMA-LAD atretic, SVG-OM 100% (chronic), EF 45%, pt req defib x 13 for VF/VT arrest, CHB w/ tem pacer, IABP and intubation  . Coronary artery bypass graft N/A 05/28/2014    Procedure: REDO CORONARY ARTERY BYPASS GRAFTING (CABG);   Surgeon: Grace Isaac, MD;  Location: [facility];  Service: Open Heart Surgery;  Laterality: N/A;  Times 4 using right internal mammary artery to Intermediate artery and endoscopically harvested left saphenous vein to LAD, OM1, and PD coronary arteries.  Darden Dates without cardioversion N/A 05/28/2014    Procedure: TRANSESOPHAGEAL ECHOCARDIOGRAM (TEE);  Surgeon: Grace Isaac, MD;  Location: [facility];  Service: Open Heart Surgery;  Laterality: N/A;  . Tee without cardioversion N/A 06/24/2014    Procedure: TRANSESOPHAGEAL ECHOCARDIOGRAM (TEE);  Surgeon: Pixie Casino, MD;  Location: [facility];  Service: Cardiovascular;  Laterality: N/A;  . Cardioversion N/A 06/24/2014    Procedure: CARDIOVERSION;  Surgeon: Pixie Casino, MD;  Location: [facility] ENDOSCOPY;  Service: Cardiovascular;  Laterality: N/A;    There were no vitals filed for this visit.  Visit Diagnosis:  Abnormality of gait  Lack of coordination  Balance problem      Subjective Assessment - 09/01/14 1235    Subjective No falls or complaints to report. Pt reports he still staggers at times.   Currently in Pain? Yes   Pain Score 6    Pain Location --  back, hips, right shoulder   Pain Orientation Lower   Pain Descriptors / Indicators Aching;Dull;Discomfort   Pain Type Chronic pain   Pain Onset More  than a month ago   Pain Frequency Intermittent   Aggravating Factors  moving      Gait training x230' with cane with good sequencing noted, L foot drop noted, pt reporting L stabbing calf pain "due to spinal stenosis" per pt., second trial of 230' with pt reporting relief in calf after stretching  Therex: -Supine bilateral knee to chest stretch 3x30 seconds with BLE and pt reporting resolution of calf pain -Standing runner's calf stretch 3x30 seconds each leg. Extra time with therex to attain proper position for stretches.   Neuro re-ed: Heel walking-- 3 laps at counter with single UE support  Standing and turning abruptly 4x  toward right and 4x toward left at 90 degree angles, then moving with two chairs back to back 4x toward left and 4x toward 4 with cane, then without cane with no loss of balance, Then standing from chair and circling around chair and sitting down 2x to each side without cane and no loss of balance.  Walking with head turns x230' with cane and close supervision without LOB  Rockerboard 30x anterior/poterior limits of stability without UE support, then 2x10 squats on rockerboard with verbal and tactile cues for increased anterior pelvic tilt for decreased retropulsion  Rockerboard with eyes closed static then with rocking then static with emphasis on correcting balance when pt unintentionally rocked backwards. Provided MIN A to steady as needed.   Inverted bosu ball standing static without UE support with CGA, then with forward reaching repeatedly ~5" then with reach across midline repeatedly with each UE. Performed with MIN A to steady.                               PT Long Term Goals - 08/16/14 1610    PT LONG TERM GOAL #1   Title Demonstrate correct performance of HEP to address balance impairment. Target 09/09/14   Status On-going   PT LONG TERM GOAL #2   Title Demonstrate ability to turn to look over left and right shoulder without unsteadiness (as in [place]). Target 09/09/14   Status On-going   PT LONG TERM GOAL #3   Title Complete DGI and write appropriate goal: Improve from 13/24 up to 18/24 to demo decreasing risk for falls. Target 09/09/14   Status Revised   PT LONG TERM GOAL #4   Title Demonstrate ability to rise from sci fit and turn left without loss of balance; and to transfer on/off of elliptical without loss of balance for increased readiness for cardiac rehab. Target 09/09/14   PT LONG TERM GOAL #5   Title Verbalize understanding of fall prevention strategies in home environment. Target 09/09/14               Plan - 09/01/14 1416    Clinical Impression  Statement Pt did not report any dizzines today. Pt demonstrated good balance with balance activities today, and improved ability to sit and stand and turn without loss of balance. Will likely be ready for d/c  next week to continue on to cardiac rehab.   PT Next Visit Plan Begin checking LTGs and determine readiness for d/c at end of next week.   Consulted and Agree with Plan of Care Patient        Problem List Patient Active Problem List   Diagnosis Date Noted  . Hypokalemia 06/29/2014  . PVD (peripheral vascular disease) with claudication   . COPD (chronic obstructive pulmonary  disease)   . Tobacco use disorder, continuous   . Hyponatremia   . Nausea with vomiting   . Orthostatic hypotension 06/22/2014  . Dehydration 06/22/2014  . Atrial flutter with rapid ventricular response 06/22/2014  . Pain in joint, shoulder region 06/14/2014  . Acute blood loss anemia 06/09/2014  . Hypertensive heart disease 06/09/2014  . Hematuria 05/31/2014  . S/P CABG x 4 05/28/2014  . PAF- on Amiodarone 05/25/2014  . Chronic anticoagulation-on Eliquis at discharge 05/11/14 05/25/2014  . ICM-EF 45-50% by echo 04/30/14 05/25/2014  . Protein-calorie malnutrition, severe 05/25/2014  . Encephalopathy 05/14/2014  . Acute respiratory acidosis   . S/P CABG x 2 05/04/2014  . Acute respiratory failure   . STEMI 05/04/14 with shock, CPR, CHB, VT 04/30/2014  . Cardiogenic shock 04/30/2014  . Cardiac arrest 04/30/2014  . VT (ventricular tachycardia) 04/30/2014  . CHB (complete heart block) 04/30/2014  . CAD S/P LM and CFX PCI 05/04/14 04/30/2014  . Wart viral 04/18/2014  . Laceration of index finger 12/04/2012  . Hand eczema 05/26/2012  . Bruising 02/20/2012  . Actinic keratoses 11/14/2011  . PAD (peripheral artery disease) 08/28/2011  . Constipation 07/18/2011  . Sinusitis acute 04/18/2011  . Ventral hernia 01/03/2011  . SWEATING 06/26/2010  . History of transient ischemic attack (TIA)   . CERUMEN  IMPACTION 02/20/2010  . DIVERTICULOSIS-COLON 11/01/2009  . RECTAL BLEEDING 11/01/2009  . ABDOMINAL BLOATING 11/01/2009  . ABNORMAL BOWEL SOUNDS 11/01/2009  . ABDOMINAL PAIN-RLQ 11/01/2009  . ABDOMINAL PAIN-LLQ 11/01/2009  . ABDOMINAL PAIN, GENERALIZED 11/01/2009  . Rash and nonspecific skin eruption 07/11/2009  . TRANSIENT ISCHEMIC ATTACK, HX OF 03/22/2009  . PVD WITH CLAUDICATION 03/14/2009  . BRONCHITIS, ACUTE 11/26/2008  . Personal history of malignant neoplasm of prostate 08/02/2008  . ALLERGIC RHINITIS 08/06/2007  . ELEVATED PROSTATE SPECIFIC ANTIGEN 08/06/2007  . Hyperlipidemia 04/22/2007  . Hereditary and idiopathic peripheral neuropathy 04/22/2007  . UPPER RESPIRATORY INFECTION (URI) 04/22/2007  . COPD exacerbation 04/22/2007  . Osteoarthritis 04/22/2007  . LOW BACK PAIN 04/22/2007   Delrae Sawyers, PT,DPT,NCS 09/01/2014 2:19 PM Phone [phone] FAX [phone]         [facility] [address] [place], [place], [address] Phone: [phone]   Fax:  [phone]

## 2014-09-07 ENCOUNTER — Ambulatory Visit: Payer: Medicare Other | Admitting: Rehabilitative and Restorative Service Providers"

## 2014-09-07 DIAGNOSIS — R269 Unspecified abnormalities of gait and mobility: Secondary | ICD-10-CM | POA: Diagnosis not present

## 2014-09-07 DIAGNOSIS — R293 Abnormal posture: Secondary | ICD-10-CM

## 2014-09-07 DIAGNOSIS — R279 Unspecified lack of coordination: Secondary | ICD-10-CM | POA: Diagnosis not present

## 2014-09-07 NOTE — Therapy (Signed)
[facility] [address] [place], [place], [address] Phone: [phone]   Fax:  [phone]  Physical Therapy Treatment  Patient Details  Name: [name] MRN: [id number] Date of Birth: 30-Aug-1940 Referring Provider:  Cassandria Anger, MD  Encounter Date: 09/07/2014      PT End of Session - 09/07/14 1454    Visit Number 7   Number of Visits 9   Date for PT Re-Evaluation 09/24/14   Authorization Type Medicare G codes required   PT Start Time 0938   PT Stop Time 1018   PT Time Calculation (min) 40 min   Equipment Utilized During Treatment Gait belt   Activity Tolerance Patient tolerated treatment well   Behavior During Therapy [facility] for tasks assessed/performed      Past Medical History  Diagnosis Date  . CAD (coronary artery disease)     CABG x 2 with LIMA to Circumflex and vein graft to PDA  07/13/1991  . History of transient ischemic attack (TIA)   . Hyperlipidemia   . Peripheral neuropathy   . PVD (peripheral vascular disease) with claudication   . Carotid bruit     bilateral  . Ventral hernia   . Personal history of prostate cancer   . Diverticulosis   . ALLERGIC RHINITIS   . Osteoarthritis   . COPD (chronic obstructive pulmonary disease)   . Tobacco use disorder, continuous   . STEMI (ST elevation myocardial infarction) 04/29/2014    PTCA Lmain and CFX, in setting of VF/VT arrest and CGS    Past Surgical History  Procedure Laterality Date  . Coronary artery bypass graft  1992  . Lumbar laminectomy  [id number]     X2  . Prostatectomy  05/2008    Dr. Alinda Money and XRT  . Appendectomy    . Tonsillectomy    . Left heart catheterization with coronary angiogram N/A 04/30/2014    Procedure: LEFT HEART CATHETERIZATION WITH CORONARY ANGIOGRAM;  Surgeon: Peter M Martinique, MD; Lmain 90% (s/p PTCA), LAD 80%, CFX 100% (s/p PTCA), RCA OK, LIMA-LAD atretic, SVG-OM 100% (chronic), EF 45%, pt req defib x 13  for VF/VT arrest, CHB w/ tem pacer, IABP and intubation  . Coronary artery bypass graft N/A 05/28/2014    Procedure: REDO CORONARY ARTERY BYPASS GRAFTING (CABG);  Surgeon: Grace Isaac, MD;  Location: [facility];  Service: Open Heart Surgery;  Laterality: N/A;  Times 4 using right internal mammary artery to Intermediate artery and endoscopically harvested left saphenous vein to LAD, OM1, and PD coronary arteries.  Darden Dates without cardioversion N/A 05/28/2014    Procedure: TRANSESOPHAGEAL ECHOCARDIOGRAM (TEE);  Surgeon: Grace Isaac, MD;  Location: [facility];  Service: Open Heart Surgery;  Laterality: N/A;  . Tee without cardioversion N/A 06/24/2014    Procedure: TRANSESOPHAGEAL ECHOCARDIOGRAM (TEE);  Surgeon: Pixie Casino, MD;  Location: [facility];  Service: Cardiovascular;  Laterality: N/A;  . Cardioversion N/A 06/24/2014    Procedure: CARDIOVERSION;  Surgeon: Pixie Casino, MD;  Location: [facility] ENDOSCOPY;  Service: Cardiovascular;  Laterality: N/A;    There were no vitals filed for this visit.  Visit Diagnosis:  Abnormality of gait  Abnormal posture      Subjective Assessment - 09/07/14 0942    Subjective The patient reports his pain level is increased today,  His pain is worse in both legs today and down R arm.  He reports pain is shooting in nature in his big toe.   Currently  in Pain? Yes   Pain Score 7    Pain Location Back  legs, neck, R arm/shoulder   Pain Orientation Lower   Pain Descriptors / Indicators Aching;Shooting;Sharp   Pain Type Chronic pain   Pain Onset More than a month ago   Pain Frequency Intermittent   Aggravating Factors  moving aggravates it     SELF CARE/HOME MANAGEMENT: Educated patient on CDC handout for orthostatic hypotension recommending he decrease caffeine intake each day (>8 cups of coffee) Educated patient on types of activities to expect at cardiac rehab to discuss transitioning to cardiac rehab in next 1-2 weeks   Gait: Ambulates on level  surfaces 345 ft nonstop without device with close supervision DGI=19/24 Treadmill x 2 minutes, then rest, then 2 more minutes up to 1.0 mph.  Patient functionally able to do more, however HR in 120s with gait for short distances and recommended frequent rest breaks       [facility] PT Assessment - 09/07/14 0958    Dynamic Gait Index   Level Surface Mild Impairment   Change in Gait Speed Normal   Gait with Horizontal Head Turns Mild Impairment   Gait with Vertical Head Turns Mild Impairment   Gait and Pivot Turn Normal   Step Over Obstacle Mild Impairment   Step Around Obstacles Normal   Steps Mild Impairment   Total Score 19            PT Long Term Goals - 09/07/14 1456    PT LONG TERM GOAL #1   Title Demonstrate correct performance of HEP to address balance impairment. Target 09/09/14   Status On-going   PT LONG TERM GOAL #2   Title Demonstrate ability to turn to look over left and right shoulder without unsteadiness (as in [place]). Target 09/09/14   Status On-going   PT LONG TERM GOAL #3   Title Complete DGI and write appropriate goal: Improve from 13/24 up to 18/24 to demo decreasing risk for falls. Target 09/09/14   Baseline Pt scored 19/24 at today's visit on DGI 09/07/2014   Status Achieved   PT LONG TERM GOAL #4   Title Demonstrate ability to rise from sci fit and turn left without loss of balance; and to transfer on/off of elliptical without loss of balance for increased readiness for cardiac rehab. Target 09/09/14   Status On-going   PT LONG TERM GOAL #5   Title Verbalize understanding of fall prevention strategies in home environment. Target 09/09/14   Status On-going               Plan - 09/07/14 1456    Clinical Impression Statement The patient met LTG for DGI.  He performed treadmill walking to mimic cardiac rehab today and had elevated HR to 126 with 2 minutes of ambulation.  Rested and alloowed HR to return to 90s before continuing.   PT Next Visit Plan Check LTGs  and transition to cardiac rehab   Consulted and Agree with Plan of Care Patient        Problem List Patient Active Problem List   Diagnosis Date Noted  . Hypokalemia 06/29/2014  . PVD (peripheral vascular disease) with claudication   . COPD (chronic obstructive pulmonary disease)   . Tobacco use disorder, continuous   . Hyponatremia   . Nausea with vomiting   . Orthostatic hypotension 06/22/2014  . Dehydration 06/22/2014  . Atrial flutter with rapid ventricular response 06/22/2014  . Pain in joint, shoulder region 06/14/2014  .  Acute blood loss anemia 06/09/2014  . Hypertensive heart disease 06/09/2014  . Hematuria 05/31/2014  . S/P CABG x 4 05/28/2014  . PAF- on Amiodarone 05/25/2014  . Chronic anticoagulation-on Eliquis at discharge 05/11/14 05/25/2014  . ICM-EF 45-50% by echo 04/30/14 05/25/2014  . Protein-calorie malnutrition, severe 05/25/2014  . Encephalopathy 05/14/2014  . Acute respiratory acidosis   . S/P CABG x 2 05/04/2014  . Acute respiratory failure   . STEMI 05/04/14 with shock, CPR, CHB, VT 04/30/2014  . Cardiogenic shock 04/30/2014  . Cardiac arrest 04/30/2014  . VT (ventricular tachycardia) 04/30/2014  . CHB (complete heart block) 04/30/2014  . CAD S/P LM and CFX PCI 05/04/14 04/30/2014  . Wart viral 04/18/2014  . Laceration of index finger 12/04/2012  . Hand eczema 05/26/2012  . Bruising 02/20/2012  . Actinic keratoses 11/14/2011  . PAD (peripheral artery disease) 08/28/2011  . Constipation 07/18/2011  . Sinusitis acute 04/18/2011  . Ventral hernia 01/03/2011  . SWEATING 06/26/2010  . History of transient ischemic attack (TIA)   . CERUMEN IMPACTION 02/20/2010  . DIVERTICULOSIS-COLON 11/01/2009  . RECTAL BLEEDING 11/01/2009  . ABDOMINAL BLOATING 11/01/2009  . ABNORMAL BOWEL SOUNDS 11/01/2009  . ABDOMINAL PAIN-RLQ 11/01/2009  . ABDOMINAL PAIN-LLQ 11/01/2009  . ABDOMINAL PAIN, GENERALIZED 11/01/2009  . Rash and nonspecific skin eruption 07/11/2009   . TRANSIENT ISCHEMIC ATTACK, HX OF 03/22/2009  . PVD WITH CLAUDICATION 03/14/2009  . BRONCHITIS, ACUTE 11/26/2008  . Personal history of malignant neoplasm of prostate 08/02/2008  . ALLERGIC RHINITIS 08/06/2007  . ELEVATED PROSTATE SPECIFIC ANTIGEN 08/06/2007  . Hyperlipidemia 04/22/2007  . Hereditary and idiopathic peripheral neuropathy 04/22/2007  . UPPER RESPIRATORY INFECTION (URI) 04/22/2007  . COPD exacerbation 04/22/2007  . Osteoarthritis 04/22/2007  . LOW BACK PAIN 04/22/2007    Renalda Locklin, PT 09/07/2014, 2:58 PM  [facility] [address] [place], [place], [address] Phone: [phone]   Fax:  [phone]

## 2014-09-08 ENCOUNTER — Ambulatory Visit (HOSPITAL_COMMUNITY): Payer: Medicare Other

## 2014-09-09 ENCOUNTER — Ambulatory Visit: Payer: Medicare Other | Attending: Internal Medicine | Admitting: Rehabilitative and Restorative Service Providers"

## 2014-09-09 ENCOUNTER — Telehealth: Payer: Self-pay | Admitting: Rehabilitative and Restorative Service Providers"

## 2014-09-09 ENCOUNTER — Telehealth: Payer: Self-pay | Admitting: Cardiovascular Disease

## 2014-09-09 VITALS — HR 120

## 2014-09-09 DIAGNOSIS — R293 Abnormal posture: Secondary | ICD-10-CM | POA: Insufficient documentation

## 2014-09-09 DIAGNOSIS — R269 Unspecified abnormalities of gait and mobility: Secondary | ICD-10-CM | POA: Diagnosis not present

## 2014-09-09 DIAGNOSIS — R29818 Other symptoms and signs involving the nervous system: Secondary | ICD-10-CM | POA: Insufficient documentation

## 2014-09-09 NOTE — Telephone Encounter (Signed)
New Message  Carlette With Cardiac rehab at [facility] called states that she contacted the pt to get him signed up for cardiac rehab. In researching his chart she found that he had a heart rate was 120 today. She states that he has a history of Afib.. Was placed on amiodarone. Upon contacting the pt she reports that he states "he is ok". However he has nothing to check his heart rate with. Her request with out office is to please call this pt to see if he will need to be seen.

## 2014-09-09 NOTE — Telephone Encounter (Signed)
Pt advised during PT, see note, to call our office if having problems.  Spoke with pt he he feeling fine.  Pt has no c/o palpitations, SOB, or CP.  Pt reminded to call our office if has any of these symptoms.  Pt agrees and no additional questions at this time.

## 2014-09-09 NOTE — Therapy (Signed)
[facility] [address] [place], [place], [address] Phone: [phone]   Fax:  [phone]  Physical Therapy Treatment  Patient Details  Name: [name] MRN: [id number] Date of Birth: 1940-07-12 Referring Provider:  Cassandria Anger, MD  Encounter Date: 09/09/2014      PT End of Session - 09/09/14 0926    Visit Number 8   Number of Visits 9   Date for PT Re-Evaluation 09/24/14   Authorization Type Medicare G codes required   PT Start Time 0850   PT Stop Time 0920   PT Time Calculation (min) 30 min   Activity Tolerance Patient tolerated treatment well   Behavior During Therapy [facility] for tasks assessed/performed      Past Medical History  Diagnosis Date  . CAD (coronary artery disease)     CABG x 2 with LIMA to Circumflex and vein graft to PDA  07/13/1991  . History of transient ischemic attack (TIA)   . Hyperlipidemia   . Peripheral neuropathy   . PVD (peripheral vascular disease) with claudication   . Carotid bruit     bilateral  . Ventral hernia   . Personal history of prostate cancer   . Diverticulosis   . ALLERGIC RHINITIS   . Osteoarthritis   . COPD (chronic obstructive pulmonary disease)   . Tobacco use disorder, continuous   . STEMI (ST elevation myocardial infarction) 04/29/2014    PTCA Lmain and CFX, in setting of VF/VT arrest and CGS    Past Surgical History  Procedure Laterality Date  . Coronary artery bypass graft  1992  . Lumbar laminectomy  [id number]     X2  . Prostatectomy  05/2008    Dr. Alinda Money and XRT  . Appendectomy    . Tonsillectomy    . Left heart catheterization with coronary angiogram N/A 04/30/2014    Procedure: LEFT HEART CATHETERIZATION WITH CORONARY ANGIOGRAM;  Surgeon: Peter M Martinique, MD; Lmain 90% (s/p PTCA), LAD 80%, CFX 100% (s/p PTCA), RCA OK, LIMA-LAD atretic, SVG-OM 100% (chronic), EF 45%, pt req defib x 13 for VF/VT arrest, CHB w/ tem pacer, IABP and  intubation  . Coronary artery bypass graft N/A 05/28/2014    Procedure: REDO CORONARY ARTERY BYPASS GRAFTING (CABG);  Surgeon: [facility] Isaac, MD;  Location: [facility];  Service: Open Heart Surgery;  Laterality: N/A;  Times 4 using right internal mammary artery to Intermediate artery and endoscopically harvested left saphenous vein to LAD, OM1, and PD coronary arteries.  Darden Dates without cardioversion N/A 05/28/2014    Procedure: TRANSESOPHAGEAL ECHOCARDIOGRAM (TEE);  Surgeon: [facility] Isaac, MD;  Location: [facility];  Service: Open Heart Surgery;  Laterality: N/A;  . Tee without cardioversion N/A 06/24/2014    Procedure: TRANSESOPHAGEAL ECHOCARDIOGRAM (TEE);  Surgeon: Pixie Casino, MD;  Location: [facility];  Service: Cardiovascular;  Laterality: N/A;  . Cardioversion N/A 06/24/2014    Procedure: CARDIOVERSION;  Surgeon: Pixie Casino, MD;  Location: [facility] ENDOSCOPY;  Service: Cardiovascular;  Laterality: N/A;    Filed Vitals:   09/09/14 0855  Pulse: 120    Visit Diagnosis:  Abnormality of gait      Subjective Assessment - 09/09/14 0855    Subjective The patient reports pain is worse this week.  Usually pain is in the 5-6 range and his current level is >6 (rated at 7/10)   Currently in Pain? Yes   Pain Score 7    Pain Location Back   Pain  Orientation Lower   Pain Descriptors / Indicators Aching;Shooting;Sharp   Pain Type Chronic pain   Pain Onset More than a month ago   Pain Frequency Intermittent   Aggravating Factors  movement     Patient reports he hasn't slept well for the past 2 nights.  He is having increased pain (chronic pain of low back radiating into hips and neck pain radiating into right shoulder) VITALS AT REST: HR initially 120, with pulse oximeter palpated with irregularity noted While resting, HR fluctuates from 82-110 bpm with no change in activity Patient is feeling more fatigued than usual, he denies chest pain, denies shortness of breath.  He notes headache today  localized to right side. BP=85/57 PT recommended patient call cardiology office if he has any worsening of symptoms.  He is in no acute distress today.  SELF CARE/HOME MANAGEMENT: PT and patient discussed plan of transitioning to cardiac rehab recommending he continue current HEP. He should see his cardiologist before this transition due to recent irregularities in HR noted Discussed use of rollater RW and SPC while in cardiac rehab for safety           PT Education - 09/09/14 0925    Education provided Yes   Education Details safety during cardiac rehab, technique for floor transfers reviewed   Person(s) Educated Patient   Methods Explanation   Comprehension Verbalized understanding             PT Long Term Goals - 09/07/14 1456    PT LONG TERM GOAL #1   Title Demonstrate correct performance of HEP to address balance impairment. Target 09/09/14   Status On-going   PT LONG TERM GOAL #2   Title Demonstrate ability to turn to look over left and right shoulder without unsteadiness (as in [place]). Target 09/09/14   Status On-going   PT LONG TERM GOAL #3   Title Complete DGI and write appropriate goal: Improve from 13/24 up to 18/24 to demo decreasing risk for falls. Target 09/09/14   Baseline Pt scored 19/24 at today's visit on DGI 09/07/2014   Status Achieved   PT LONG TERM GOAL #4   Title Demonstrate ability to rise from sci fit and turn left without loss of balance; and to transfer on/off of elliptical without loss of balance for increased readiness for cardiac rehab. Target 09/09/14   Status On-going   PT LONG TERM GOAL #5   Title Verbalize understanding of fall prevention strategies in home environment. Target 09/09/14   Status On-going               Plan - 09/09/14 0926    Clinical Impression Statement The patient arrives today reporting greater fatigue.  PT monitored vitals with irregular HR noted.  PT communicated findings to cardiology office and recommended patient  f/u with MD.  He is currently scheduled to be seen on 6/9@11  at [facility]   PT Next Visit Plan Check LTGs and transition to cardiac rehab *monitor HE   Consulted and Agree with Plan of Care Patient        Problem List Patient Active Problem List   Diagnosis Date Noted  . Hypokalemia 06/29/2014  . PVD (peripheral vascular disease) with claudication   . COPD (chronic obstructive pulmonary disease)   . Tobacco use disorder, continuous   . Hyponatremia   . Nausea with vomiting   . Orthostatic hypotension 06/22/2014  . Dehydration 06/22/2014  . Atrial flutter with rapid ventricular response 06/22/2014  . Pain  in joint, shoulder region 06/14/2014  . Acute blood loss anemia 06/09/2014  . Hypertensive heart disease 06/09/2014  . Hematuria 05/31/2014  . S/P CABG x 4 05/28/2014  . PAF- on Amiodarone 05/25/2014  . Chronic anticoagulation-on Eliquis at discharge 05/11/14 05/25/2014  . ICM-EF 45-50% by echo 04/30/14 05/25/2014  . Protein-calorie malnutrition, severe 05/25/2014  . Encephalopathy 05/14/2014  . Acute respiratory acidosis   . S/P CABG x 2 05/04/2014  . Acute respiratory failure   . STEMI 05/04/14 with shock, CPR, CHB, VT 04/30/2014  . Cardiogenic shock 04/30/2014  . Cardiac arrest 04/30/2014  . VT (ventricular tachycardia) 04/30/2014  . CHB (complete heart block) 04/30/2014  . CAD S/P LM and CFX PCI 05/04/14 04/30/2014  . Wart viral 04/18/2014  . Laceration of index finger 12/04/2012  . Hand eczema 05/26/2012  . Bruising 02/20/2012  . Actinic keratoses 11/14/2011  . PAD (peripheral artery disease) 08/28/2011  . Constipation 07/18/2011  . Sinusitis acute 04/18/2011  . Ventral hernia 01/03/2011  . SWEATING 06/26/2010  . History of transient ischemic attack (TIA)   . CERUMEN IMPACTION 02/20/2010  . DIVERTICULOSIS-COLON 11/01/2009  . RECTAL BLEEDING 11/01/2009  . ABDOMINAL BLOATING 11/01/2009  . ABNORMAL BOWEL SOUNDS 11/01/2009  . ABDOMINAL PAIN-RLQ 11/01/2009  .  ABDOMINAL PAIN-LLQ 11/01/2009  . ABDOMINAL PAIN, GENERALIZED 11/01/2009  . Rash and nonspecific skin eruption 07/11/2009  . TRANSIENT ISCHEMIC ATTACK, HX OF 03/22/2009  . PVD WITH CLAUDICATION 03/14/2009  . BRONCHITIS, ACUTE 11/26/2008  . Personal history of malignant neoplasm of prostate 08/02/2008  . ALLERGIC RHINITIS 08/06/2007  . ELEVATED PROSTATE SPECIFIC ANTIGEN 08/06/2007  . Hyperlipidemia 04/22/2007  . Hereditary and idiopathic peripheral neuropathy 04/22/2007  . UPPER RESPIRATORY INFECTION (URI) 04/22/2007  . COPD exacerbation 04/22/2007  . Osteoarthritis 04/22/2007  . LOW BACK PAIN 04/22/2007    Stevie Ertle, PT 09/09/2014, 9:27 AM  [facility] [address] [place], [place], [address] Phone: [phone]   Fax:  [phone]

## 2014-09-09 NOTE — Telephone Encounter (Signed)
See my note. cdm

## 2014-09-09 NOTE — Telephone Encounter (Signed)
Mr. [name] has had elevated HR for past 2 therapy visits.  He arrived today and physical activity avoided due to elevated HR.  Patient is ready to transition to cardiac rehab from a balance/mobility standpoint.  PT recommended he f/u with MD due to recent elevation in HR.  His next scheduled visit at [facility] is 09/16/14.  Patient reports he hasn't slept well for the past 2 nights.  He is having increased pain (chronic pain of low back radiating into hips and neck pain radiating into right shoulder) VITALS AT REST: HR initially 120, with pulse oximeter palpated with irregularity noted While resting, HR fluctuates from 82-110 bpm with no change in activity Patient is feeling more fatigued than usual, he denies chest pain, denies shortness of breath.  He notes headache today localized to right side. BP=85/57 PT recommended patient call cardiology office if he has any worsening of symptoms.  He is in no acute distress today.

## 2014-09-09 NOTE — Telephone Encounter (Signed)
[name], If his HR is in the 120s, he will probably at least need a nurse room visit for EKG. We can talk about this in the am. Gerald Stabs

## 2014-09-10 NOTE — Telephone Encounter (Signed)
See previous phone note for documentation regarding heart rate this AM

## 2014-09-10 NOTE — Telephone Encounter (Signed)
Spoke with pt. He checked pulse with machine last night and it was 71.  This morning heart rate is 75. Blood pressure this AM is 126/44.  He reports dizziness that is worse in the AM and with change in position.  Pt aware to change position slowly. He had appt with Dr. Angelena Form on Aug 18, 2014 but this appt was cancelled.  I have scheduled him to see Dr. Angelena Form on September 16, 2014 at 3:00.

## 2014-09-13 ENCOUNTER — Ambulatory Visit (HOSPITAL_COMMUNITY): Payer: Medicare Other

## 2014-09-14 ENCOUNTER — Encounter: Payer: Self-pay | Admitting: Rehabilitative and Restorative Service Providers"

## 2014-09-14 ENCOUNTER — Ambulatory Visit: Payer: Medicare Other | Admitting: Rehabilitative and Restorative Service Providers"

## 2014-09-14 DIAGNOSIS — R293 Abnormal posture: Secondary | ICD-10-CM

## 2014-09-14 DIAGNOSIS — R29818 Other symptoms and signs involving the nervous system: Secondary | ICD-10-CM | POA: Diagnosis not present

## 2014-09-14 DIAGNOSIS — R269 Unspecified abnormalities of gait and mobility: Secondary | ICD-10-CM | POA: Diagnosis not present

## 2014-09-14 DIAGNOSIS — R2689 Other abnormalities of gait and mobility: Secondary | ICD-10-CM

## 2014-09-14 NOTE — Therapy (Signed)
[facility] [address] [place], [place], [address] Phone: [phone]   Fax:  [phone]  Patient Details  Name: [name] MRN: [id number] Date of Birth: [birth year] Referring Provider:  No ref. provider found  Encounter Date: 09/14/2014  PHYSICAL THERAPY DISCHARGE SUMMARY  Visits from Start of Care: 9   Current functional level related to goals / functional outcomes:     PT Long Term Goals - 09/14/14 0949    PT LONG TERM GOAL #1   Title Demonstrate correct performance of HEP to address balance impairment. Target 09/09/14   Baseline Met on 09/14/2014   Status Achieved   PT LONG TERM GOAL #2   Title Demonstrate ability to turn to look over left and right shoulder without unsteadiness (as in [place]). Target 09/09/14   Baseline Met on 09/14/2014   Status Achieved   PT LONG TERM GOAL #3   Title Complete DGI and write appropriate goal: Improve from 13/24 up to 18/24 to demo decreasing risk for falls. Target 09/09/14   Baseline Pt scored 19/24 at today's visit on DGI 09/07/2014   Status Achieved   PT LONG TERM GOAL #4   Title Demonstrate ability to rise from sci fit and turn left without loss of balance; and to transfer on/off of elliptical without loss of balance for increased readiness for cardiac rehab. Target 09/09/14   Baseline Patient is able to navigate endurance equipment in therapy independently.   Status Achieved   PT LONG TERM GOAL #5   Title Verbalize understanding of fall prevention strategies in home environment. Target 09/09/14   Baseline Met on 09/14/2014   Status Achieved        Remaining deficits: Cardiac endurance-recommended patient transition to cardiac rehab per MD recommendations.   Education / Equipment: HEP, fall safety.  Plan: Patient agrees to discharge.  Patient goals were met. Patient is being discharged due to meeting the stated rehab goals.  ?????Thank you for the referral of this  patient.          [place], [place] 09/14/2014, 12:18 PM  [facility] [address] [place], [place], [address] Phone: [phone]   Fax:  [phone]

## 2014-09-14 NOTE — Therapy (Signed)
[facility] [address] [place], [place], [address] Phone: [phone]   Fax:  [phone]  Physical Therapy Treatment  Patient Details  Name: [name] MRN: [id number] Date of Birth: 09-11-1940 Referring Provider:  Cassandria Anger, MD  Encounter Date: 09/14/2014      PT End of Session - 09/14/14 0958    Visit Number 9   Number of Visits 9   Date for PT Re-Evaluation 09/24/14   Authorization Type Medicare G codes required   PT Start Time 0934   PT Stop Time 1010   PT Time Calculation (min) 36 min   Activity Tolerance Patient tolerated treatment well   Behavior During Therapy [facility] for tasks assessed/performed      Past Medical History  Diagnosis Date  . CAD (coronary artery disease)     CABG x 2 with LIMA to Circumflex and vein graft to PDA  07/13/1991  . History of transient ischemic attack (TIA)   . Hyperlipidemia   . Peripheral neuropathy   . PVD (peripheral vascular disease) with claudication   . Carotid bruit     bilateral  . Ventral hernia   . Personal history of prostate cancer   . Diverticulosis   . ALLERGIC RHINITIS   . Osteoarthritis   . COPD (chronic obstructive pulmonary disease)   . Tobacco use disorder, continuous   . STEMI (ST elevation myocardial infarction) 04/29/2014    PTCA Lmain and CFX, in setting of VF/VT arrest and CGS    Past Surgical History  Procedure Laterality Date  . Coronary artery bypass graft  1992  . Lumbar laminectomy  [id number]     X2  . Prostatectomy  05/2008    Dr. Alinda Money and XRT  . Appendectomy    . Tonsillectomy    . Left heart catheterization with coronary angiogram N/A 04/30/2014    Procedure: LEFT HEART CATHETERIZATION WITH CORONARY ANGIOGRAM;  Surgeon: Peter M Martinique, MD; Lmain 90% (s/p PTCA), LAD 80%, CFX 100% (s/p PTCA), RCA OK, LIMA-LAD atretic, SVG-OM 100% (chronic), EF 45%, pt req defib x 13 for VF/VT arrest, CHB w/ tem pacer, IABP and  intubation  . Coronary artery bypass graft N/A 05/28/2014    Procedure: REDO CORONARY ARTERY BYPASS GRAFTING (CABG);  Surgeon: [facility], MD;  Location: [facility];  Service: Open Heart Surgery;  Laterality: N/A;  Times 4 using right internal mammary artery to Intermediate artery and endoscopically harvested left saphenous vein to LAD, OM1, and PD coronary arteries.  Darden Dates without cardioversion N/A 05/28/2014    Procedure: TRANSESOPHAGEAL ECHOCARDIOGRAM (TEE);  Surgeon: [facility], MD;  Location: [facility];  Service: Open Heart Surgery;  Laterality: N/A;  . Tee without cardioversion N/A 06/24/2014    Procedure: TRANSESOPHAGEAL ECHOCARDIOGRAM (TEE);  Surgeon: Pixie Casino, MD;  Location: [facility];  Service: Cardiovascular;  Laterality: N/A;  . Cardioversion N/A 06/24/2014    Procedure: CARDIOVERSION;  Surgeon: [facility] Casino, MD;  Location: [facility] ENDOSCOPY;  Service: Cardiovascular;  Laterality: N/A;    There were no vitals filed for this visit.  Visit Diagnosis:  Abnormality of gait  Balance problem  Abnormal posture      Subjective Assessment - 09/14/14 0941    Subjective The patient is continuing with elevated pain in neck and back.  He reports his heart rate has been WNLs at home.  He feels he is getting around well at home.  He was contacted by cardiac rehab and is scheduled  to begin on 6/16 with orientation.   Currently in Pain? Yes   Pain Score 7    Pain Location Back  and neck   Pain Orientation --  upper neck and lower back   Pain Descriptors / Indicators Aching   Pain Type Chronic pain   Pain Onset More than a month ago   Pain Frequency Intermittent   Aggravating Factors  movement   Effect of Pain on Daily Activities --  PT to monitor, no pain goal to follow      THERAPEUTIC EXERCISE: Performed ambulation for cardiac exercise as patient is now modified indep with rollater without falls.  He is able to safely get onto/off of equipment in the gym area in  preparation for cardiac rehab Patient negotiated steps x 4 stairs with handrails with HR elevating to 125 and requiring rest x 3 minutes for HR to return to 100.  Standing weight shifting activities.  SELF CARE/HOME MANAGEMENT: Discussed HEP and post d/c instructions.  Recommend patient bring both his [facility] and rollater to cardiac rehab. Discussed home safety for post d/c.        PT Long Term Goals - 10/05/2014 0949    PT LONG TERM GOAL #1   Title Demonstrate correct performance of HEP to address balance impairment. Target 09/09/14   Baseline Met on 10/05/14   Status Achieved   PT LONG TERM GOAL #2   Title Demonstrate ability to turn to look over left and right shoulder without unsteadiness (as in [place]). Target 09/09/14   Baseline Met on 10-05-14   Status Achieved   PT LONG TERM GOAL #3   Title Complete DGI and write appropriate goal: Improve from 13/24 up to 18/24 to demo decreasing risk for falls. Target 09/09/14   Baseline Pt scored 19/24 at today's visit on DGI 09/07/2014   Status Achieved   PT LONG TERM GOAL #4   Title Demonstrate ability to rise from sci fit and turn left without loss of balance; and to transfer on/off of elliptical without loss of balance for increased readiness for cardiac rehab. Target 09/09/14   Baseline Patient is able to navigate endurance equipment in therapy independently.   Status Achieved   PT LONG TERM GOAL #5   Title Verbalize understanding of fall prevention strategies in home environment. Target 09/09/14   Baseline Met on 10-05-2014   Status Achieved           Plan - Oct 05, 2014 1205    Clinical Impression Statement The patient met all LTGs and is transitioning to cardiac rehab.  His HR was WNLs at baseline, however it increased to 125 after stair negotiation.  Pt to f/u with cardiologist this week for further assessment.   PT Next Visit Plan discharge today   Consulted and Agree with Plan of Care Patient          G-Codes - Oct 05, 2014 1209-06-05     Functional Assessment Tool Used Berg=47/56,   Functional Limitation Mobility: Walking and moving around   Mobility: Walking and Moving Around Goal Status [phone]) At least 1 percent but less than 20 percent impaired, limited or restricted   Mobility: Walking and Moving Around Discharge Status [phone]) At least 1 percent but less than 20 percent impaired, limited or restricted      Problem List Patient Active Problem List   Diagnosis Date Noted  . Hypokalemia 06/29/2014  . PVD (peripheral vascular disease) with claudication   . COPD (chronic obstructive pulmonary disease)   . Tobacco  use disorder, continuous   . Hyponatremia   . Nausea with vomiting   . Orthostatic hypotension 06/22/2014  . Dehydration 06/22/2014  . Atrial flutter with rapid ventricular response 06/22/2014  . Pain in joint, shoulder region 06/14/2014  . Acute blood loss anemia 06/09/2014  . Hypertensive heart disease 06/09/2014  . Hematuria 05/31/2014  . S/P CABG x 4 05/28/2014  . PAF- on Amiodarone 05/25/2014  . Chronic anticoagulation-on Eliquis at discharge 05/11/14 05/25/2014  . ICM-EF 45-50% by echo 04/30/14 05/25/2014  . Protein-calorie malnutrition, severe 05/25/2014  . Encephalopathy 05/14/2014  . Acute respiratory acidosis   . S/P CABG x 2 05/04/2014  . Acute respiratory failure   . STEMI 05/04/14 with shock, CPR, CHB, VT 04/30/2014  . Cardiogenic shock 04/30/2014  . Cardiac arrest 04/30/2014  . VT (ventricular tachycardia) 04/30/2014  . CHB (complete heart block) 04/30/2014  . CAD S/P LM and CFX PCI 05/04/14 04/30/2014  . Wart viral 04/18/2014  . Laceration of index finger 12/04/2012  . Hand eczema 05/26/2012  . Bruising 02/20/2012  . Actinic keratoses 11/14/2011  . PAD (peripheral artery disease) 08/28/2011  . Constipation 07/18/2011  . Sinusitis acute 04/18/2011  . Ventral hernia 01/03/2011  . SWEATING 06/26/2010  . History of transient ischemic attack (TIA)   . CERUMEN IMPACTION 02/20/2010  .  DIVERTICULOSIS-COLON 11/01/2009  . RECTAL BLEEDING 11/01/2009  . ABDOMINAL BLOATING 11/01/2009  . ABNORMAL BOWEL SOUNDS 11/01/2009  . ABDOMINAL PAIN-RLQ 11/01/2009  . ABDOMINAL PAIN-LLQ 11/01/2009  . ABDOMINAL PAIN, GENERALIZED 11/01/2009  . Rash and nonspecific skin eruption 07/11/2009  . TRANSIENT ISCHEMIC ATTACK, HX OF 03/22/2009  . PVD WITH CLAUDICATION 03/14/2009  . BRONCHITIS, ACUTE 11/26/2008  . Personal history of malignant neoplasm of prostate 08/02/2008  . ALLERGIC RHINITIS 08/06/2007  . ELEVATED PROSTATE SPECIFIC ANTIGEN 08/06/2007  . Hyperlipidemia 04/22/2007  . Hereditary and idiopathic peripheral neuropathy 04/22/2007  . UPPER RESPIRATORY INFECTION (URI) 04/22/2007  . COPD exacerbation 04/22/2007  . Osteoarthritis 04/22/2007  . LOW BACK PAIN 04/22/2007    Shann Merrick, PT 09/14/2014, 12:12 PM  [facility] [address] [place], [place], [address] Phone: [phone]   Fax:  [phone]

## 2014-09-15 ENCOUNTER — Ambulatory Visit (HOSPITAL_COMMUNITY): Payer: Medicare Other

## 2014-09-16 ENCOUNTER — Ambulatory Visit (INDEPENDENT_AMBULATORY_CARE_PROVIDER_SITE_OTHER): Payer: Medicare Other | Admitting: Cardiothoracic Surgery

## 2014-09-16 ENCOUNTER — Encounter: Payer: Self-pay | Admitting: Cardiovascular Disease

## 2014-09-16 ENCOUNTER — Ambulatory Visit (INDEPENDENT_AMBULATORY_CARE_PROVIDER_SITE_OTHER): Payer: Medicare Other | Admitting: Cardiovascular Disease

## 2014-09-16 ENCOUNTER — Encounter: Payer: Self-pay | Admitting: Cardiothoracic Surgery

## 2014-09-16 VITALS — BP 102/60 | HR 87 | Ht 71.5 in | Wt 164.0 lb

## 2014-09-16 VITALS — BP 121/55 | HR 59 | Resp 16 | Ht 71.0 in | Wt 164.0 lb

## 2014-09-16 DIAGNOSIS — I2510 Atherosclerotic heart disease of native coronary artery without angina pectoris: Secondary | ICD-10-CM

## 2014-09-16 DIAGNOSIS — F17200 Nicotine dependence, unspecified, uncomplicated: Secondary | ICD-10-CM

## 2014-09-16 DIAGNOSIS — Z951 Presence of aortocoronary bypass graft: Secondary | ICD-10-CM

## 2014-09-16 DIAGNOSIS — I255 Ischemic cardiomyopathy: Secondary | ICD-10-CM | POA: Diagnosis not present

## 2014-09-16 DIAGNOSIS — I739 Peripheral vascular disease, unspecified: Secondary | ICD-10-CM

## 2014-09-16 DIAGNOSIS — I4891 Unspecified atrial fibrillation: Secondary | ICD-10-CM | POA: Diagnosis not present

## 2014-09-16 DIAGNOSIS — Z720 Tobacco use: Secondary | ICD-10-CM | POA: Diagnosis not present

## 2014-09-16 MED ORDER — METOPROLOL TARTRATE 50 MG PO TABS: 50.0000 mg | ORAL_TABLET | Freq: Two times a day (BID) | ORAL | Status: DC

## 2014-09-16 NOTE — Progress Notes (Signed)
Chief Complaint  Patient presents with  . Dizziness    History of Present Illness: [name] with history of CAD s/p CABG 1993 and redo 2016, PAD, hyperlipidemia, atrial fibrillation/flutter who is here today for cardiac follow up. He has been followed in our office since 2011. I initially followed his PAD only. He has had PTA of the occluded left SFA and stent placement May 2011. I then staged intervention of his right SFA severe stenosis and treated this with a [place] atherectomy device in June 2011. He is known to have moderate carotid artery disease. His CAD remained quiet from cath in 1999 until recently. In January 2016 he was admitted to [facility] with NSTEMI and atrial fib with RVR. Cardiac cath showed a critical left main stenosis, LAD, and Circumflex lesions with a left dominant system. The vein to his OM was occluded and the LIMA to the LAD was atretic. He developed cardiogenic shock requiring balloon pump and IV pressor support. He also suffered recurrent V- Fib, with CPR and multiple defibrillations performed. He required ventilator support for respiratory failure. He underwent partially successful PTCA of the distal left main and ostial circumflex. During that hospitalization, a TCTS consult was obtained for consideration of redo CABG, but patient was not felt to be a candidate at that time due to deconditioning. He was discharged to a nursing home on 05/14/2014. He initially did okay, however, he presented back to [facility] with recurrent chest pain on 05/20/14 and was admitted 2/11-2/27/16. He was again found to have a mildly elevated troponin and to be in atrial flutter. He converted to NSR with Amiodarone and was seen by Dr. Servando Snare. Decision was made to pursue re-do CABG (RIMA-Intermediate, S-OM, S-PDA, S-dist LAD). Postop course was notable for recurrent AFib/Flutter and was continued on Amiodarone. He was placed on Eliquis for stroke prophylaxis.Readmitted 06/22/14  with weakness, atrial flutter. He underwent TEE guided cardioversion on 06/24/14. Carotid dopplers February 2016 with mild bilateral disease. Lower ext dopplers June 2015 with widely patent left SFA stent and 50% right SFA stenosis. Recent dizziness and elevated HR in rehab so he is added onto my schedule today. He was seen today by Dr. Servando Snare for wound check but other issues not addressed.   He is here today for follow up. No chest pains or SOB. Still reporting some dizziness but no syncope. He has not felt his heart racing. No lower ext swelling. He continues to smoke. Reports HR 70-110 during the day.   Primary Care Physician: Dr. Alain Marion.  Last Lipid Profile:Lipid Panel     Component Value Date/Time   CHOL 146 11/19/2012 0958   TRIG 93.0 11/19/2012 0958   TRIG 91 04/22/2006 0854   HDL 38.50* 11/19/2012 0958   CHOLHDL 4 11/19/2012 0958   CHOLHDL 3.6 CALC 04/22/2006 0854   VLDL 18.6 11/19/2012 0958   LDLCALC 89 11/19/2012 0958     Past Medical History  Diagnosis Date  . CAD (coronary artery disease)     CABG x 2 with LIMA to Circumflex and vein graft to PDA  07/13/1991  . History of transient ischemic attack (TIA)   . Hyperlipidemia   . Peripheral neuropathy   . PVD (peripheral vascular disease) with claudication   . Carotid bruit     bilateral  . Ventral hernia   . Personal history of prostate cancer   . Diverticulosis   . ALLERGIC RHINITIS   . Osteoarthritis   . COPD (chronic obstructive pulmonary disease)   .  Tobacco use disorder, continuous   . STEMI (ST elevation myocardial infarction) 04/29/2014    PTCA Lmain and CFX, in setting of VF/VT arrest and CGS    Past Surgical History  Procedure Laterality Date  . Coronary artery bypass graft  1992  . Lumbar laminectomy  [id number]     X2  . Prostatectomy  05/2008    Dr. Alinda Money and XRT  . Appendectomy    . Tonsillectomy    . Left heart catheterization with coronary angiogram N/A 04/30/2014    Procedure: LEFT HEART  CATHETERIZATION WITH CORONARY ANGIOGRAM;  Surgeon: Peter M Martinique, MD; Lmain 90% (s/p PTCA), LAD 80%, CFX 100% (s/p PTCA), RCA OK, LIMA-LAD atretic, SVG-OM 100% (chronic), EF 45%, pt req defib x 13 for VF/VT arrest, CHB w/ tem pacer, IABP and intubation  . Coronary artery bypass graft N/A 05/28/2014    Procedure: REDO CORONARY ARTERY BYPASS GRAFTING (CABG);  Surgeon: Grace Isaac, MD;  Location: [facility];  Service: Open Heart Surgery;  Laterality: N/A;  Times 4 using right internal mammary artery to Intermediate artery and endoscopically harvested left saphenous vein to LAD, OM1, and PD coronary arteries.  Darden Dates without cardioversion N/A 05/28/2014    Procedure: TRANSESOPHAGEAL ECHOCARDIOGRAM (TEE);  Surgeon: Grace Isaac, MD;  Location: [place];  Service: Open Heart Surgery;  Laterality: N/A;  . Tee without cardioversion N/A 06/24/2014    Procedure: TRANSESOPHAGEAL ECHOCARDIOGRAM (TEE);  Surgeon: Pixie Casino, MD;  Location[facility] ENDOSCOPY;  Service: Cardiovascular;  Laterality: N/A;  . Cardioversion N/A 06/24/2014    Procedure: CARDIOVERSION;  Surgeon: Pixie Casino, MD;  Location: [facility] ENDOSCOPY;  Service: Cardiovascular;  Laterality: N/A;    Current Outpatient Prescriptions  Medication Sig Dispense Refill  . amiodarone (PACERONE) 200 MG tablet Take 1 tablet (200 mg total) by mouth daily. 90 tablet 3  . apixaban (ELIQUIS) 5 MG TABS tablet Take 1 tablet (5 mg total) by mouth 2 (two) times daily. 180 tablet 3  . atorvastatin (LIPITOR) 40 MG tablet Take 1 tablet (40 mg total) by mouth at bedtime. 90 tablet 3  . clopidogrel (PLAVIX) 75 MG tablet Take 1 tablet (75 mg total) by mouth daily. 90 tablet 3  . feeding supplement, ENSURE COMPLETE, (ENSURE COMPLETE) LIQD Take 237 mLs by mouth 2 (two) times daily between meals.    . ferrous gluconate (FERGON) 324 MG tablet Take 1 tablet (324 mg total) by mouth 2 (two) times daily with a meal. 180 tablet 0  . fluticasone (FLONASE) 50 MCG/ACT nasal spray  Place 1 spray into both nostrils as needed for allergies. 16 g 3  . folic acid (FOLVITE) 1 MG tablet Take 1 tablet (1 mg total) by mouth daily. 90 tablet 3  . furosemide (LASIX) 20 MG tablet Take 1 tablet (20 mg total) by mouth every other day. 90 tablet 1  . loratadine (CLARITIN) 10 MG tablet Take 10 mg by mouth daily as needed for allergies.     . metoprolol (LOPRESSOR) 50 MG tablet Take 1 tablet (50 mg total) by mouth 2 (two) times daily. 90 tablet 3  . nitroGLYCERIN (NITROSTAT) 0.4 MG SL tablet Place 0.4 mg under the tongue every 5 (five) minutes as needed for chest pain (MAX 3 TABLETS).     . pantoprazole (PROTONIX) 40 MG tablet Take 1 tablet (40 mg total) by mouth daily. 90 tablet 3  . potassium chloride (KLOR-CON) 8 MEQ tablet Take 1 tablet (8 mEq total) by mouth daily. 90 tablet 3  .  traMADol (ULTRAM) 50 MG tablet Take 1-2 tablets (50-100 mg total) by mouth every 6 (six) hours as needed for moderate pain or severe pain. 30 tablet 0  . zolpidem (AMBIEN) 5 MG tablet Take 1 tablet (5 mg total) by mouth at bedtime as needed for sleep. 30 tablet 3   No current facility-administered medications for this visit.    Allergies  Allergen Reactions  . Adhesive [Tape] Other (See Comments)    Takes skin off   . Codeine Sulfate Itching and Other (See Comments)    headaches    History   Social History  . Marital Status: Married    Spouse Name: N/A  . Number of Children: N/A  . Years of Education: N/A   Occupational History  . Retired-self employed    Social History Main Topics  . Smoking status: Current Every Day Smoker -- 40 years    Types: Cigarettes  . Smokeless tobacco: Not on file  . Alcohol Use: No  . Drug Use: No  . Sexual Activity: Yes   Other Topics Concern  . Not on file   Social History Narrative    Family History  Problem Relation Age of Onset  . Hypertension    . Cancer Mother     Colon  . Heart disease Father   . Coronary artery disease Father   . Cancer  Sister   . Heart disease Brother     Review of Systems:  As stated in the HPI and otherwise negative.   BP 102/60 mmHg  Pulse 87  Ht 5' 11.5" (1.816 m)  Wt 164 lb (74.39 kg)  BMI 22.56 kg/m2  Physical Examination: General: Well developed, well nourished, NAD HEENT: OP clear, mucus membranes moist SKIN: warm, dry. No rashes. Neuro: No focal deficits Musculoskeletal: Muscle strength 5/5 all ext Psychiatric: Mood and affect normal Neck: No JVD, no carotid bruits, no thyromegaly, no lymphadenopathy. Lungs:Clear bilaterally, no wheezes, rhonci, crackles Cardiovascular: Regular rate and rhythm. No murmurs, gallops or rubs. Abdomen:Soft. Bowel sounds present. Non-tender.  Extremities: No lower extremity edema. Pulses are 1-2 + in the bilateral DP/PT.  EKG:  EKG is ordered today. The ekg ordered today demonstrates Atrial flutter, rate 87 bpm. Inferior Q-waves  Recent Labs: 05/29/2014: Magnesium 2.6* 06/22/2014: ALT 30 06/29/2014: BUN 15; Creatinine, Ser 1.00; Hemoglobin 12.6*; Platelets 445.0*; Potassium 3.3*; Sodium 133*   Lipid Panel    Component Value Date/Time   CHOL 146 11/19/2012 0958   TRIG 93.0 11/19/2012 0958   TRIG 91 04/22/2006 0854   HDL 38.50* 11/19/2012 0958   CHOLHDL 4 11/19/2012 0958   CHOLHDL 3.6 CALC 04/22/2006 0854   VLDL 18.6 11/19/2012 0958   LDLCALC 89 11/19/2012 0958     Wt Readings from Last 3 Encounters:  09/16/14 164 lb (74.39 kg)  09/16/14 164 lb (74.39 kg)  07/19/14 157 lb (71.215 kg)     Other studies Reviewed: Additional studies/ records that were reviewed today include: . Review of the above records demonstrates:    Assessment and Plan:   1. CAD: Stable post bypass. Continue statin and beta blocker.   2. PAD: Stable. Needs updating of his ABI at next visit.   3. Atrial fibrillation/flutter: Atrial flutter today. HR has been elevated at home. Will increase Lopressor to 50 mg BID. Continue amiodarone. He is being anti-coagulated with  Eliquis. No bleeding issues. Recent cardioversion 06/24/14 but back in atrial fib. No plans to repeat cardioversion at this time as he is rate controlled and  doing well.   4. Tobacco abuse: smoking cessation recommended.   Current medicines are reviewed at length with the patient today.  The patient does not have concerns regarding medicines.  The following changes have been made:  no change  Labs/ tests ordered today include:   Orders Placed This Encounter  Procedures  . EKG 12-Lead    Disposition:   FU with me in 8 weeks  Signed, Lauree Chandler, MD 09/16/2014 6:20 PM    [facility] [place], [place], [place]  [address] Phone: [phone]; Fax: [phone]

## 2014-09-16 NOTE — Patient Instructions (Signed)
Medication Instructions:  Your physician has recommended you make the following change in your medication: Increase lopressor to 50 mg by mouth twice daily.   Labwork: none  Testing/Procedures: none  Follow-Up: Your physician recommends that you schedule a follow-up appointment in: 6-8 weeks. Scheduled for November 04, 2014 at 9:45

## 2014-09-16 NOTE — Progress Notes (Signed)
[address]       [place] [address]             [phone]      [name] [facility] Medical Record #[id number] Date of Birth: 08/02/1940  Referring: Nahser, Wonda Cheng, MD Primary Care: Walker Kehr, MD  Chief Complaint:   POST OP FOLLOW UP 05/27/2014  OPERATIVE REPORT PREOPERATIVE DIAGNOSIS: Three-vessel coronary artery disease with left main obstruction. POSTOPERATIVE DIAGNOSIS: Three-vessel coronary artery disease with left main obstruction. SURGICAL PROCEDURE: Redo Coronary artery bypass grafting with the right internal mammary to the intermediate coronary artery, reverse right greater saphenous vein graft to the obtuse marginal coronary artery, reverse greater saphenous vein graft to the posterior descending coronary artery and reverse Greater saphenous vein graft to the distal LAD with redo sternotomy. SURGEON: Lanelle Bal, MD  History of Present Illness:     Since discharge from the patient's recent complicated course after presenting with a V. fib arrest and subsequently having redo coronary artery bypass grafting he appears to be making reasonable progress. His original CABG was done as emergency in 1993.  His postoperative course was complicated by recurrent atrial fibrillation. He was admitted in mid March for repeat cardioversion. Initially was discharged to a nursing facility but now is living at home. He is gaining strength. Is mobile with the aid of a rolling walker, mentally very sharp. He is to start cardiac rehab next week.  Remains in AF, and unfortunately continues to smoke. He continues to have chronic neck, back pain    Past Medical History  Diagnosis Date  . CAD (coronary artery disease)     CABG x 2 with LIMA to Circumflex and vein graft to PDA  07/13/1991  . History of transient ischemic attack (TIA)   . Hyperlipidemia   . Peripheral neuropathy   . PVD (peripheral vascular disease) with claudication   .  Carotid bruit     bilateral  . Ventral hernia   . Personal history of prostate cancer   . Diverticulosis   . ALLERGIC RHINITIS   . Osteoarthritis   . COPD (chronic obstructive pulmonary disease)   . Tobacco use disorder, continuous   . STEMI (ST elevation myocardial infarction) 04/29/2014    PTCA Lmain and CFX, in setting of VF/VT arrest and CGS     History  Smoking status  . Current Every Day Smoker -- 40 years  . Types: Cigarettes  Smokeless tobacco  . Not on file    History  Alcohol Use No     Allergies  Allergen Reactions  . Adhesive [Tape] Other (See Comments)    Takes skin off   . Codeine Sulfate Itching and Other (See Comments)    headaches    Current Outpatient Prescriptions  Medication Sig Dispense Refill  . amiodarone (PACERONE) 200 MG tablet Take 1 tablet (200 mg total) by mouth daily. 90 tablet 3  . apixaban (ELIQUIS) 5 MG TABS tablet Take 1 tablet (5 mg total) by mouth 2 (two) times daily. 180 tablet 3  . atorvastatin (LIPITOR) 40 MG tablet Take 1 tablet (40 mg total) by mouth at bedtime. 90 tablet 3  . clopidogrel (PLAVIX) 75 MG tablet Take 1 tablet (75 mg total) by mouth daily. 90 tablet 3  . feeding supplement, ENSURE COMPLETE, (ENSURE COMPLETE) LIQD Take 237 mLs by mouth 2 (two) times daily between meals.    . ferrous gluconate (FERGON) 324 MG tablet Take 1 tablet (324  mg total) by mouth 2 (two) times daily with a meal. 180 tablet 0  . fluticasone (FLONASE) 50 MCG/ACT nasal spray Place 1 spray into both nostrils as needed for allergies. 16 g 3  . folic acid (FOLVITE) 1 MG tablet Take 1 tablet (1 mg total) by mouth daily. 90 tablet 3  . furosemide (LASIX) 20 MG tablet Take 1 tablet (20 mg total) by mouth every other day. 90 tablet 1  . loratadine (CLARITIN) 10 MG tablet Take 10 mg by mouth daily as needed for allergies.     . metoprolol (LOPRESSOR) 50 MG tablet TAKE ONE-HALF (1/2) TABLET (25 MG TOTAL) DAILY 45 tablet 2  . nitroGLYCERIN (NITROSTAT) 0.4 MG  SL tablet Place 0.4 mg under the tongue every 5 (five) minutes as needed for chest pain (MAX 3 TABLETS).     . pantoprazole (PROTONIX) 40 MG tablet Take 1 tablet (40 mg total) by mouth daily. 90 tablet 3  . potassium chloride (KLOR-CON) 8 MEQ tablet Take 1 tablet (8 mEq total) by mouth daily. 90 tablet 3  . traMADol (ULTRAM) 50 MG tablet Take 1-2 tablets (50-100 mg total) by mouth every 6 (six) hours as needed for moderate pain or severe pain. 30 tablet 0  . zolpidem (AMBIEN) 5 MG tablet Take 1 tablet (5 mg total) by mouth at bedtime as needed for sleep. 30 tablet 3   No current facility-administered medications for this visit.       Physical Exam: BP 121/55 mmHg  Pulse 59  Resp 16  Ht 5\' 11"  (1.803 m)  Wt 164 lb (74.39 kg)  BMI 22.88 kg/m2  SpO2 98%  General appearance: alert and cooperative Neurologic: intact Heart: Irregularr rate and rhythm palpation palpation likely atrial fibrillation, S1, S2 normal, no murmur, click, rub or gallop Lungs: clear to auscultation bilaterally Abdomen: soft, non-tender; bowel sounds normal; no masses,  no organomegaly Extremities: extremities normal, atraumatic, no cyanosis or edema and Homans sign is negative, no sign of DVT Wound: Sternum is stable and well healed   Diagnostic Studies & Laboratory data:     Recent Radiology Findings:   No results found.    Recent Lab Findings: Lab Results  Component Value Date   WBC 7.8 06/29/2014   HGB 12.6* 06/29/2014   HCT 37.6* 06/29/2014   PLT 445.0* 06/29/2014   GLUCOSE 144* 06/29/2014   CHOL 146 11/19/2012   TRIG 93.0 11/19/2012   HDL 38.50* 11/19/2012   LDLCALC 89 11/19/2012   ALT 30 06/22/2014   AST 21 06/22/2014   NA 133* 06/29/2014   K 3.3* 06/29/2014   CL 96 06/29/2014   CREATININE 1.00 06/29/2014   BUN 15 06/29/2014   CO2 30 06/29/2014   TSH 1.14 04/12/2011   INR 1.30 05/28/2014   HGBA1C 5.7 07/11/2006      Assessment / Plan:      From a surgical standpoint he could  return to driving, however because of his episodes of ventricular fibrillation this final decision will be left to cardiology and/or EP. Now more then three months post op,  Recurrent atrial fibrillation currently remains on  Eliquis and plavix Still smoking in spite of efforts to stop Will see back as needed   Grace Isaac MD      [address] [place] [address] Office [phone]   Beeper [phone]  09/16/2014 11:18 AM

## 2014-09-16 NOTE — Addendum Note (Signed)
Addended by: Thompson Grayer on: 09/16/2014 07:30 PM   Modules accepted: Orders

## 2014-09-20 ENCOUNTER — Ambulatory Visit (HOSPITAL_COMMUNITY): Payer: Medicare Other

## 2014-09-22 ENCOUNTER — Ambulatory Visit (HOSPITAL_COMMUNITY): Payer: Medicare Other

## 2014-09-23 ENCOUNTER — Encounter (HOSPITAL_COMMUNITY)
Admission: RE | Admit: 2014-09-23 | Discharge: 2014-09-23 | Disposition: A | Discharge status: Home / Self Care | Payer: Medicare Other | Source: Ambulatory Visit | Attending: Cardiovascular Disease | Admitting: Cardiovascular Disease

## 2014-09-23 DIAGNOSIS — Z951 Presence of aortocoronary bypass graft: Secondary | ICD-10-CM | POA: Insufficient documentation

## 2014-09-23 DIAGNOSIS — Z48812 Encounter for surgical aftercare following surgery on the circulatory system: Secondary | ICD-10-CM | POA: Insufficient documentation

## 2014-09-23 NOTE — Progress Notes (Signed)
Cardiac Rehab Medication Review by a Pharmacist  Does the patient  feel that his/her medications are working for him/her?  yes  Has the patient been experiencing any side effects to the medications prescribed?  no  Does the patient measure his/her own blood pressure or blood glucose at home?  no   Does the patient have any problems obtaining medications due to transportation or finances?   no  Understanding of regimen: excellent Understanding of indications: excellent Potential of compliance: excellent    Pharmacist comments: Reviewed proper use of SL NTG.  Pt has a good understanding of medication regimen and indications.  He reports never missing any doses of his meds.  Pt questions answered.   Drucie Opitz, PharmD Clinical Pharmacy Resident Pager: [phone] 09/23/2014 8:25 AM

## 2014-09-27 ENCOUNTER — Encounter (HOSPITAL_COMMUNITY): Payer: Self-pay

## 2014-09-27 ENCOUNTER — Ambulatory Visit (HOSPITAL_COMMUNITY): Payer: Medicare Other

## 2014-09-27 ENCOUNTER — Encounter (HOSPITAL_COMMUNITY)
Admission: RE | Admit: 2014-09-27 | Discharge: 2014-09-27 | Disposition: A | Discharge status: Home / Self Care | Payer: Medicare Other | Source: Ambulatory Visit | Attending: Cardiovascular Disease | Admitting: Cardiovascular Disease

## 2014-09-27 DIAGNOSIS — Z48812 Encounter for surgical aftercare following surgery on the circulatory system: Secondary | ICD-10-CM | POA: Diagnosis not present

## 2014-09-27 DIAGNOSIS — Z951 Presence of aortocoronary bypass graft: Secondary | ICD-10-CM | POA: Diagnosis not present

## 2014-09-27 NOTE — Progress Notes (Signed)
Pt started cardiac rehab today.  Pt tolerated light exercise without difficulty. VSS, telemetry-atrial fibrillation, asymptomatic.  Medication list reconciled.  Pt verbalized compliance with medications and denies barriers to compliance. PSYCHOSOCIAL ASSESSMENT:  PHQ-0. Pt exhibits positive coping skills, hopeful outlook with supportive family. 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  His children are available to help as needed with one son being the designated relief person.  She is a heavy smoker, therefore he continues to smoke 4-5 cigarettes per day.  Pt counseled smoking cessation. Instructed to call 1800quitnow. Pt does not verbalize commitment to cessation at this time. Pt offered emotional support and reassurance.   Pt does have chronic back pain, he is wearing a brace and using rollator  at cardiac rehab.  Pt tolerated light activity without difficulty.      Pt enjoys "living". Pt states he finds pleasure and joy in all activities and looks forward to each new day.   Pt cardiac rehab  goal is  to increase leg strength and arm strength.  Pt encouraged to participate in cardiac rehab exercise program, including light weights as well as participating in home exercise on his off days from rehab  to increase ability to achieve these goal.    Pt oriented to exercise equipment and routine.  Understanding verbalized.

## 2014-09-28 ENCOUNTER — Other Ambulatory Visit: Payer: Self-pay | Admitting: Cardiovascular Disease

## 2014-09-28 ENCOUNTER — Other Ambulatory Visit: Payer: Self-pay | Admitting: Physician Assistant

## 2014-09-28 DIAGNOSIS — I739 Peripheral vascular disease, unspecified: Secondary | ICD-10-CM

## 2014-09-28 DIAGNOSIS — I6523 Occlusion and stenosis of bilateral carotid arteries: Secondary | ICD-10-CM

## 2014-09-29 ENCOUNTER — Encounter (HOSPITAL_COMMUNITY)
Admission: RE | Admit: 2014-09-29 | Discharge: 2014-09-29 | Disposition: A | Discharge status: Home / Self Care | Payer: Medicare Other | Source: Ambulatory Visit | Attending: Cardiovascular Disease | Admitting: Cardiovascular Disease

## 2014-09-29 ENCOUNTER — Ambulatory Visit (HOSPITAL_COMMUNITY): Payer: Medicare Other

## 2014-09-29 DIAGNOSIS — Z48812 Encounter for surgical aftercare following surgery on the circulatory system: Secondary | ICD-10-CM | POA: Diagnosis not present

## 2014-09-29 DIAGNOSIS — Z951 Presence of aortocoronary bypass graft: Secondary | ICD-10-CM | POA: Diagnosis not present

## 2014-09-29 NOTE — Progress Notes (Signed)
QUALITY OF LIFE SCORE REVIEW  Patient quality of life slightly altered by physical constraints which limits ability to perform as prior to recent cardiac illness.  Pt c/o increased fatigue and loss of strength/stamina.  Pt reports he suffers from insomnia;  only getting a good night's sleep very infrequently.    Otherwise pt reports he is well satisfied with his life.  He has upbeat positive attitude.  Pt denies stress or anxiety.   Offered emotional support and reassurance.  Will continue to monitor and intervene as necessary.

## 2014-10-04 ENCOUNTER — Ambulatory Visit (HOSPITAL_COMMUNITY): Payer: Medicare Other

## 2014-10-04 ENCOUNTER — Encounter (HOSPITAL_COMMUNITY)
Admission: RE | Admit: 2014-10-04 | Discharge: 2014-10-04 | Disposition: A | Discharge status: Home / Self Care | Payer: Medicare Other | Source: Ambulatory Visit | Attending: Cardiovascular Disease | Admitting: Cardiovascular Disease

## 2014-10-04 DIAGNOSIS — Z48812 Encounter for surgical aftercare following surgery on the circulatory system: Secondary | ICD-10-CM | POA: Diagnosis not present

## 2014-10-04 DIAGNOSIS — Z951 Presence of aortocoronary bypass graft: Secondary | ICD-10-CM | POA: Diagnosis not present

## 2014-10-05 ENCOUNTER — Ambulatory Visit (HOSPITAL_COMMUNITY): Payer: Medicare Other | Attending: Internal Medicine

## 2014-10-05 ENCOUNTER — Ambulatory Visit (HOSPITAL_BASED_OUTPATIENT_CLINIC_OR_DEPARTMENT_OTHER): Payer: Medicare Other

## 2014-10-05 DIAGNOSIS — I739 Peripheral vascular disease, unspecified: Secondary | ICD-10-CM | POA: Diagnosis not present

## 2014-10-05 DIAGNOSIS — I6523 Occlusion and stenosis of bilateral carotid arteries: Secondary | ICD-10-CM | POA: Insufficient documentation

## 2014-10-06 ENCOUNTER — Encounter (HOSPITAL_COMMUNITY)
Admission: RE | Admit: 2014-10-06 | Discharge: 2014-10-06 | Disposition: A | Discharge status: Home / Self Care | Payer: Medicare Other | Source: Ambulatory Visit | Attending: Cardiovascular Disease | Admitting: Cardiovascular Disease

## 2014-10-06 ENCOUNTER — Ambulatory Visit (HOSPITAL_COMMUNITY): Payer: Medicare Other

## 2014-10-06 DIAGNOSIS — Z951 Presence of aortocoronary bypass graft: Secondary | ICD-10-CM | POA: Diagnosis not present

## 2014-10-06 DIAGNOSIS — Z48812 Encounter for surgical aftercare following surgery on the circulatory system: Secondary | ICD-10-CM | POA: Diagnosis not present

## 2014-10-11 ENCOUNTER — Ambulatory Visit (HOSPITAL_COMMUNITY): Payer: Medicare Other

## 2014-10-11 ENCOUNTER — Encounter (HOSPITAL_COMMUNITY): Payer: Medicare Other

## 2014-10-13 ENCOUNTER — Ambulatory Visit (HOSPITAL_COMMUNITY): Payer: Medicare Other

## 2014-10-13 ENCOUNTER — Encounter (HOSPITAL_COMMUNITY)
Admission: RE | Admit: 2014-10-13 | Discharge: 2014-10-13 | Disposition: A | Discharge status: Home / Self Care | Payer: Medicare Other | Source: Ambulatory Visit | Attending: Cardiovascular Disease | Admitting: Cardiovascular Disease

## 2014-10-13 DIAGNOSIS — Z951 Presence of aortocoronary bypass graft: Secondary | ICD-10-CM | POA: Diagnosis not present

## 2014-10-13 DIAGNOSIS — Z48812 Encounter for surgical aftercare following surgery on the circulatory system: Secondary | ICD-10-CM | POA: Diagnosis not present

## 2014-10-18 ENCOUNTER — Ambulatory Visit (HOSPITAL_COMMUNITY): Payer: Medicare Other

## 2014-10-18 ENCOUNTER — Encounter (HOSPITAL_COMMUNITY): Payer: Medicare Other

## 2014-10-20 ENCOUNTER — Telehealth: Payer: Self-pay | Admitting: Cardiovascular Disease

## 2014-10-20 ENCOUNTER — Ambulatory Visit (HOSPITAL_COMMUNITY): Payer: Medicare Other

## 2014-10-20 ENCOUNTER — Encounter (HOSPITAL_COMMUNITY)
Admission: RE | Admit: 2014-10-20 | Discharge: 2014-10-20 | Disposition: A | Discharge status: Home / Self Care | Payer: Medicare Other | Source: Ambulatory Visit | Attending: Cardiovascular Disease | Admitting: Cardiovascular Disease

## 2014-10-20 DIAGNOSIS — Z48812 Encounter for surgical aftercare following surgery on the circulatory system: Secondary | ICD-10-CM | POA: Diagnosis not present

## 2014-10-20 DIAGNOSIS — Z951 Presence of aortocoronary bypass graft: Secondary | ICD-10-CM | POA: Diagnosis not present

## 2014-10-20 NOTE — Telephone Encounter (Signed)
New message      Pt is going to cardiac rehab.  He want to know how many visits we got him approved for.

## 2014-10-25 ENCOUNTER — Ambulatory Visit (HOSPITAL_COMMUNITY): Payer: Medicare Other

## 2014-10-25 ENCOUNTER — Encounter (HOSPITAL_COMMUNITY)
Admission: RE | Admit: 2014-10-25 | Discharge: 2014-10-25 | Disposition: A | Discharge status: Home / Self Care | Payer: Medicare Other | Source: Ambulatory Visit | Attending: Cardiovascular Disease | Admitting: Cardiovascular Disease

## 2014-10-25 DIAGNOSIS — Z48812 Encounter for surgical aftercare following surgery on the circulatory system: Secondary | ICD-10-CM | POA: Diagnosis not present

## 2014-10-25 DIAGNOSIS — Z951 Presence of aortocoronary bypass graft: Secondary | ICD-10-CM | POA: Diagnosis not present

## 2014-10-25 NOTE — Progress Notes (Addendum)
[name] 74 y.o. male Nutrition Note Spoke with pt.  Nutrition Survey reviewed with pt. Pt is following Step 1 of the Therapeutic Lifestyle Changes diet. Pt reports eating out frequently due to "I'm the caregiver to my wife, who has Alzheimer's." Pt unintentionally lost wt after surgery. Pt has been trying to prevent wt loss/promote wt gain by drinking Ensure daily. Pt expressed understanding of the information reviewed. Pt aware of nutrition education classes offered and is unable to attend nutrition classes. Lab Results  Component Value Date   HGBA1C 5.7 07/11/2006   Wt Readings from Last 3 Encounters:  09/23/14 163 lb 2.3 oz (74 kg)  09/16/14 164 lb (74.39 kg)  09/16/14 164 lb (74.39 kg)   Nutrition Diagnosis ? Food-and nutrition-related knowledge deficit related to lack of exposure to information as related to diagnosis of: ? CVD ? Pre-DM  Nutrition Intervention ? Benefits of adopting Therapeutic Lifestyle Changes discussed when Medficts reviewed. ? Pt to attend the Portion Distortion class ? Pt given handouts for: ? Nutrition I class ? Nutrition II class ? Continue client-centered nutrition education by RD, as part of interdisciplinary care.  Goal(s) ? Pt to use lower fat dairy products (milk, cheese, and ice cream) ? Pt to identify the quantity of food needed to promote wt gain to a goal weight of 77.3 kg (170 lb) ? Pt to identify and limit food sources of saturated fat, trans fat, and sodium  Monitor and Evaluate progress toward nutrition goal with team.  Derek Mound, M.Ed, RD, LDN, CDE 10/25/2014 2:13 PM

## 2014-10-26 ENCOUNTER — Encounter: Payer: Self-pay | Admitting: Internal Medicine

## 2014-10-26 ENCOUNTER — Other Ambulatory Visit (INDEPENDENT_AMBULATORY_CARE_PROVIDER_SITE_OTHER): Payer: Medicare Other

## 2014-10-26 ENCOUNTER — Ambulatory Visit (INDEPENDENT_AMBULATORY_CARE_PROVIDER_SITE_OTHER): Payer: Medicare Other | Admitting: Internal Medicine

## 2014-10-26 VITALS — BP 110/72 | HR 110 | Ht 70.0 in | Wt 167.0 lb

## 2014-10-26 DIAGNOSIS — I484 Atypical atrial flutter: Secondary | ICD-10-CM | POA: Diagnosis not present

## 2014-10-26 DIAGNOSIS — I739 Peripheral vascular disease, unspecified: Secondary | ICD-10-CM | POA: Diagnosis not present

## 2014-10-26 DIAGNOSIS — M5441 Lumbago with sciatica, right side: Secondary | ICD-10-CM

## 2014-10-26 DIAGNOSIS — E785 Hyperlipidemia, unspecified: Secondary | ICD-10-CM

## 2014-10-26 DIAGNOSIS — Z9861 Coronary angioplasty status: Secondary | ICD-10-CM

## 2014-10-26 DIAGNOSIS — I480 Paroxysmal atrial fibrillation: Secondary | ICD-10-CM | POA: Diagnosis not present

## 2014-10-26 DIAGNOSIS — Z8673 Personal history of transient ischemic attack (TIA), and cerebral infarction without residual deficits: Secondary | ICD-10-CM | POA: Diagnosis not present

## 2014-10-26 DIAGNOSIS — I255 Ischemic cardiomyopathy: Secondary | ICD-10-CM | POA: Diagnosis not present

## 2014-10-26 DIAGNOSIS — M5442 Lumbago with sciatica, left side: Secondary | ICD-10-CM

## 2014-10-26 DIAGNOSIS — I2510 Atherosclerotic heart disease of native coronary artery without angina pectoris: Secondary | ICD-10-CM

## 2014-10-26 DIAGNOSIS — Z720 Tobacco use: Secondary | ICD-10-CM

## 2014-10-26 DIAGNOSIS — I4892 Unspecified atrial flutter: Secondary | ICD-10-CM

## 2014-10-26 DIAGNOSIS — F17209 Nicotine dependence, unspecified, with unspecified nicotine-induced disorders: Secondary | ICD-10-CM

## 2014-10-26 LAB — HEPATIC FUNCTION PANEL
ALK PHOS: 78 U/L (ref 39–117)
ALT: 44 U/L (ref 0–53)
AST: 28 U/L (ref 0–37)
Albumin: 4.2 g/dL (ref 3.5–5.2)
BILIRUBIN TOTAL: 0.6 mg/dL (ref 0.2–1.2)
Bilirubin, Direct: 0.1 mg/dL (ref 0.0–0.3)
Total Protein: 6.7 g/dL (ref 6.0–8.3)

## 2014-10-26 LAB — CBC WITH DIFFERENTIAL/PLATELET
BASOS ABS: 0.1 10*3/uL (ref 0.0–0.1)
BASOS PCT: 0.8 % (ref 0.0–3.0)
EOS ABS: 0.7 10*3/uL (ref 0.0–0.7)
Eosinophils Relative: 9 % — ABNORMAL HIGH (ref 0.0–5.0)
HCT: 42.2 % (ref 39.0–52.0)
Hemoglobin: 14.2 g/dL (ref 13.0–17.0)
Lymphocytes Relative: 15.1 % (ref 12.0–46.0)
Lymphs Abs: 1.2 10*3/uL (ref 0.7–4.0)
MCHC: 33.7 g/dL (ref 30.0–36.0)
MCV: 98.1 fl (ref 78.0–100.0)
MONOS PCT: 9.4 % (ref 3.0–12.0)
Monocytes Absolute: 0.7 10*3/uL (ref 0.1–1.0)
Neutro Abs: 5 10*3/uL (ref 1.4–7.7)
Neutrophils Relative %: 65.7 % (ref 43.0–77.0)
Platelets: 229 10*3/uL (ref 150.0–400.0)
RBC: 4.3 Mil/uL (ref 4.22–5.81)
RDW: 14.5 % (ref 11.5–15.5)
WBC: 7.6 10*3/uL (ref 4.0–10.5)

## 2014-10-26 LAB — LIPID PANEL
Cholesterol: 118 mg/dL (ref 0–200)
HDL: 40.5 mg/dL (ref >39.00)
LDL Cholesterol: 56 mg/dL (ref 0–99)
NonHDL: 77.5
TRIGLYCERIDES: 109 mg/dL (ref 0.0–149.0)
Total CHOL/HDL Ratio: 3
VLDL: 21.8 mg/dL (ref 0.0–40.0)

## 2014-10-26 LAB — BASIC METABOLIC PANEL
BUN: 17 mg/dL (ref 6–23)
CALCIUM: 9.5 mg/dL (ref 8.4–10.5)
CO2: 30 mEq/L (ref 19–32)
Chloride: 100 mEq/L (ref 96–112)
Creatinine, Ser: 1.18 mg/dL (ref 0.40–1.50)
GFR: 63.82 mL/min (ref >60.00)
Glucose, Bld: 89 mg/dL (ref 70–99)
Potassium: 5 mEq/L (ref 3.5–5.1)
SODIUM: 136 meq/L (ref 135–145)

## 2014-10-26 LAB — TSH: TSH: 1.98 u[IU]/mL (ref 0.35–4.50)

## 2014-10-26 NOTE — Assessment & Plan Note (Signed)
Doing well overall On Eliquis, Lipitor

## 2014-10-26 NOTE — Progress Notes (Signed)
Subjective:  Patient ID: [name], male    DOB: [birth year]  Age: 74 y.o. MRN: [id number]  CC: No chief complaint on file.   HPI [name] presents for HTN, PVD, CAD, CHF f/u  Outpatient Prescriptions Prior to Visit  Medication Sig Dispense Refill  . amiodarone (PACERONE) 200 MG tablet Take 1 tablet (200 mg total) by mouth daily. 90 tablet 3  . apixaban (ELIQUIS) 5 MG TABS tablet Take 1 tablet (5 mg total) by mouth 2 (two) times daily. 180 tablet 3  . atorvastatin (LIPITOR) 40 MG tablet Take 1 tablet (40 mg total) by mouth at bedtime. 90 tablet 3  . clopidogrel (PLAVIX) 75 MG tablet Take 1 tablet (75 mg total) by mouth daily. 90 tablet 3  . feeding supplement, ENSURE COMPLETE, (ENSURE COMPLETE) LIQD Take 237 mLs by mouth 2 (two) times daily between meals.    . ferrous gluconate (FERGON) 324 MG tablet Take 1 tablet (324 mg total) by mouth 2 (two) times daily with a meal. 180 tablet 0  . fluticasone (FLONASE) 50 MCG/ACT nasal spray Place 1 spray into both nostrils as needed for allergies. 16 g 3  . folic acid (FOLVITE) 1 MG tablet Take 1 tablet (1 mg total) by mouth daily. 90 tablet 3  . furosemide (LASIX) 20 MG tablet Take 1 tablet (20 mg total) by mouth every other day. 90 tablet 1  . loratadine (CLARITIN) 10 MG tablet Take 10 mg by mouth daily as needed for allergies.     . metoprolol (LOPRESSOR) 50 MG tablet Take 1 tablet (50 mg total) by mouth 2 (two) times daily. 180 tablet 3  . nitroGLYCERIN (NITROSTAT) 0.4 MG SL tablet Place 0.4 mg under the tongue every 5 (five) minutes as needed for chest pain (MAX 3 TABLETS).     . pantoprazole (PROTONIX) 40 MG tablet Take 1 tablet (40 mg total) by mouth daily. 90 tablet 3  . potassium chloride (KLOR-CON) 8 MEQ tablet Take 1 tablet (8 mEq total) by mouth daily. 90 tablet 3  . traMADol (ULTRAM) 50 MG tablet Take 1-2 tablets (50-100 mg total) by mouth every 6 (six) hours as needed for moderate pain or severe pain. 30 tablet 0    . zolpidem (AMBIEN) 5 MG tablet Take 1 tablet (5 mg total) by mouth at bedtime as needed for sleep. 30 tablet 3   No facility-administered medications prior to visit.    ROS Review of Systems  Constitutional: Negative for appetite change, fatigue and unexpected weight change.  HENT: Negative for congestion, nosebleeds, sneezing, sore throat and trouble swallowing.   Eyes: Negative for itching and visual disturbance.  Respiratory: Negative for cough.   Cardiovascular: Negative for chest pain, palpitations and leg swelling.  Gastrointestinal: Negative for nausea, diarrhea, blood in stool and abdominal distention.  Genitourinary: Negative for frequency and hematuria.  Musculoskeletal: Positive for back pain, arthralgias and gait problem. Negative for joint swelling and neck pain.  Skin: Negative for rash.  Neurological: Negative for dizziness, tremors, speech difficulty and weakness.  Psychiatric/Behavioral: Negative for behavioral problems, sleep disturbance, dysphoric mood and agitation. The patient is not nervous/anxious.     Objective:  BP 110/72 mmHg  Pulse 110  Ht 5\' 10"  (1.778 m)  Wt 167 lb (75.751 kg)  BMI 23.96 kg/m2  SpO2 98%  BP Readings from Last 3 Encounters:  10/26/14 110/72  09/23/14 108/60  09/16/14 102/60    Wt Readings from Last 3 Encounters:  10/26/14 167 lb (75.751  kg)  09/23/14 163 lb 2.3 oz (74 kg)  09/16/14 164 lb (74.39 kg)    Physical Exam  Constitutional: He is oriented to person, place, and time. He appears well-developed. No distress.  NAD  HENT:  Mouth/Throat: Oropharynx is clear and moist.  Eyes: Conjunctivae are normal. Pupils are equal, round, and reactive to light.  Neck: Normal range of motion. No JVD present. No thyromegaly present.  Cardiovascular: Normal rate, regular rhythm, normal heart sounds and intact distal pulses.  Exam reveals no gallop and no friction rub.   No murmur heard. Pulmonary/Chest: Effort normal and breath sounds  normal. No respiratory distress. He has no wheezes. He has no rales. He exhibits no tenderness.  Abdominal: Soft. Bowel sounds are normal. He exhibits no distension and no mass. There is no tenderness. There is no rebound and no guarding.  Musculoskeletal: Normal range of motion. He exhibits tenderness. He exhibits no edema.  Lymphadenopathy:    He has no cervical adenopathy.  Neurological: He is alert and oriented to person, place, and time. He has normal reflexes. No cranial nerve deficit. He exhibits normal muscle tone. He displays a negative Romberg sign. Coordination abnormal. Gait normal.  Skin: Skin is warm and dry. No rash noted.  Psychiatric: He has a normal mood and affect. His behavior is normal. Judgment and thought content normal.  Using a cane  Lab Results  Component Value Date   WBC 7.8 06/29/2014   HGB 12.6* 06/29/2014   HCT 37.6* 06/29/2014   PLT 445.0* 06/29/2014   GLUCOSE 144* 06/29/2014   CHOL 146 11/19/2012   TRIG 93.0 11/19/2012   HDL 38.50* 11/19/2012   LDLCALC 89 11/19/2012   ALT 30 06/22/2014   AST 21 06/22/2014   NA 133* 06/29/2014   K 3.3* 06/29/2014   CL 96 06/29/2014   CREATININE 1.00 06/29/2014   BUN 15 06/29/2014   CO2 30 06/29/2014   TSH 1.14 04/12/2011   PSA 0.01* 02/20/2012   INR 1.30 05/28/2014   HGBA1C 5.7 07/11/2006    No results found.  Assessment & Plan:   Diagnoses and all orders for this visit:  CAD S/P LM and CFX PCI 05/04/14  History of transient ischemic attack (TIA)  Hyperlipidemia  PAD (peripheral artery disease)  I am having [name] maintain his loratadine, nitroGLYCERIN, feeding supplement (ENSURE COMPLETE), potassium chloride, zolpidem, apixaban, atorvastatin, ferrous gluconate, traMADol, amiodarone, furosemide, folic acid, fluticasone, pantoprazole, clopidogrel, and metoprolol.  No orders of the defined types were placed in this encounter.     Follow-up: Return in about 3 months (around 01/26/2015) for a  follow-up visit.  Walker Kehr, MD

## 2014-10-26 NOTE — Progress Notes (Signed)
Pre visit review using our clinic review tool, if applicable. No additional management support is needed unless otherwise documented below in the visit note. 

## 2014-10-26 NOTE — Assessment & Plan Note (Signed)
  On Eliquis, Lipitor

## 2014-10-26 NOTE — Assessment & Plan Note (Signed)
On Lipitor 

## 2014-10-27 ENCOUNTER — Encounter (HOSPITAL_COMMUNITY)
Admission: RE | Admit: 2014-10-27 | Discharge: 2014-10-27 | Disposition: A | Discharge status: Home / Self Care | Payer: Medicare Other | Source: Ambulatory Visit | Attending: Cardiovascular Disease | Admitting: Cardiovascular Disease

## 2014-10-27 ENCOUNTER — Ambulatory Visit (HOSPITAL_COMMUNITY): Payer: Medicare Other

## 2014-10-27 DIAGNOSIS — Z951 Presence of aortocoronary bypass graft: Secondary | ICD-10-CM | POA: Diagnosis not present

## 2014-10-27 DIAGNOSIS — Z48812 Encounter for surgical aftercare following surgery on the circulatory system: Secondary | ICD-10-CM | POA: Diagnosis not present

## 2014-11-01 ENCOUNTER — Encounter (HOSPITAL_COMMUNITY)
Admission: RE | Admit: 2014-11-01 | Discharge: 2014-11-01 | Disposition: A | Discharge status: Home / Self Care | Payer: Medicare Other | Source: Ambulatory Visit | Attending: Cardiovascular Disease | Admitting: Cardiovascular Disease

## 2014-11-01 ENCOUNTER — Ambulatory Visit (HOSPITAL_COMMUNITY): Payer: Medicare Other

## 2014-11-01 DIAGNOSIS — Z951 Presence of aortocoronary bypass graft: Secondary | ICD-10-CM | POA: Diagnosis not present

## 2014-11-01 DIAGNOSIS — Z48812 Encounter for surgical aftercare following surgery on the circulatory system: Secondary | ICD-10-CM | POA: Diagnosis not present

## 2014-11-01 NOTE — Progress Notes (Signed)
  30 day Psychosocial followup assessment  Patient psychosocial assessment reveals no barriers to cardiac rehab participation.  Psychosocial areas that are affecting patient's rehab experience include concerns about caregiver role.  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   Patient  does continue to exhibit positivecoping skills to deal with psychosocial concerns.  Patient does feel making progress towards cardiac rehab goals.  Patient reports health and activity level  improved in the past 30 days as evidenced by patient's reports of decreased falling at home.  Patient states family/friends  have noticed changes in inactivity or mood. Patient reports feeling positive about current and projected progress toward cardiac rehab goals. However pt does continue to smoke cigarettes.  Pt reports "occasional" cigarette use and states he has no desire to stop.   Patient's rate of progress towards goals is fair .  Plan of action to help patient continue to work towards rehab goals include risk factor reduction education classes, cardiac rehab activities combined with home exercise, continued emotional support and smoking cessation encouragement.  Will continue to monitor and evaluate progress toward psychosocial goals.   Goals in progress:  Help patient work toward returning to meaningful activities that improve patient's quality of life and are attainable.

## 2014-11-03 ENCOUNTER — Ambulatory Visit (HOSPITAL_COMMUNITY): Payer: Medicare Other

## 2014-11-03 ENCOUNTER — Encounter (HOSPITAL_COMMUNITY)
Admission: RE | Admit: 2014-11-03 | Discharge: 2014-11-03 | Disposition: A | Discharge status: Home / Self Care | Payer: Medicare Other | Source: Ambulatory Visit | Attending: Cardiovascular Disease | Admitting: Cardiovascular Disease

## 2014-11-03 DIAGNOSIS — Z951 Presence of aortocoronary bypass graft: Secondary | ICD-10-CM | POA: Diagnosis not present

## 2014-11-03 DIAGNOSIS — Z48812 Encounter for surgical aftercare following surgery on the circulatory system: Secondary | ICD-10-CM | POA: Diagnosis not present

## 2014-11-04 ENCOUNTER — Encounter: Payer: Self-pay | Admitting: Cardiovascular Disease

## 2014-11-04 ENCOUNTER — Ambulatory Visit (INDEPENDENT_AMBULATORY_CARE_PROVIDER_SITE_OTHER): Payer: Medicare Other | Admitting: Cardiovascular Disease

## 2014-11-04 VITALS — BP 116/52 | HR 99 | Wt 167.2 lb

## 2014-11-04 DIAGNOSIS — I739 Peripheral vascular disease, unspecified: Secondary | ICD-10-CM | POA: Diagnosis not present

## 2014-11-04 DIAGNOSIS — I4819 Other persistent atrial fibrillation: Secondary | ICD-10-CM

## 2014-11-04 DIAGNOSIS — I255 Ischemic cardiomyopathy: Secondary | ICD-10-CM | POA: Diagnosis not present

## 2014-11-04 DIAGNOSIS — Z720 Tobacco use: Secondary | ICD-10-CM | POA: Diagnosis not present

## 2014-11-04 DIAGNOSIS — I481 Persistent atrial fibrillation: Secondary | ICD-10-CM | POA: Diagnosis not present

## 2014-11-04 DIAGNOSIS — I2510 Atherosclerotic heart disease of native coronary artery without angina pectoris: Secondary | ICD-10-CM | POA: Diagnosis not present

## 2014-11-04 DIAGNOSIS — F17200 Nicotine dependence, unspecified, uncomplicated: Secondary | ICD-10-CM

## 2014-11-04 NOTE — Progress Notes (Signed)
Chief Complaint  Patient presents with  . Follow-up    History of Present Illness: [name] with history of CAD s/p CABG 1993 and redo 2016, PAD, hyperlipidemia, atrial fibrillation/flutter who is here today for cardiac follow up. He has been followed in our office since 2011. I initially followed his PAD only. He has had PTA of the occluded left SFA and stent placement May 2011. I then staged intervention of his right SFA severe stenosis and treated this with a [place] atherectomy device in June 2011. He is known to have moderate carotid artery disease. His CAD remained quiet from cath in 1999 until recently. In January 2016 he was admitted to [facility] with NSTEMI and atrial fib with RVR. Cardiac cath showed a critical left main stenosis, LAD, and Circumflex lesions with a left dominant system. The vein to his OM was occluded and the LIMA to the LAD was atretic. He developed cardiogenic shock requiring balloon pump and IV pressor support. He also suffered recurrent V- Fib, with CPR and multiple defibrillations performed. He required ventilator support for respiratory failure. He underwent partially successful PTCA of the distal left main and ostial circumflex. During that hospitalization, a TCTS consult was obtained for consideration of redo CABG, but patient was not felt to be a candidate at that time due to deconditioning. He was discharged to a nursing home on 05/14/2014. He initially did okay, however, he presented back to [facility] with recurrent chest pain on 05/20/14 and was admitted 2/11-2/27/16. He was again found to have a mildly elevated troponin and to be in atrial flutter. He converted to NSR with Amiodarone and was seen by Dr. Servando Snare. Decision was made to pursue re-do CABG (RIMA-Intermediate, S-OM, S-PDA, S-dist LAD). Postop course was notable for recurrent AFib/Flutter and was continued on Amiodarone. He was placed on Eliquis for stroke prophylaxis.Readmitted 06/22/14  with weakness, atrial flutter. He underwent TEE guided cardioversion on 06/24/14. Carotid dopplers February 2016 with mild bilateral disease. Lower ext dopplers June 2015 with widely patent left SFA stent and 50% right SFA stenosis. Recent dizziness and elevated HR in rehab so he is added onto my schedule today. He was seen today by Dr. Servando Snare for wound check but other issues not addressed.   He is here today for follow up. No chest pains or SOB. Feeling well. No syncope. He has not felt his heart racing. No lower ext swelling. He continues to smoke. Reports HR 70-110 during the day.   Primary Care Physician: Dr. Alain [facility].  Last Lipid Profile:Lipid Panel     Component Value Date/Time   CHOL 118 10/26/2014 1409   TRIG 109.0 10/26/2014 1409   TRIG 91 04/22/2006 0854   HDL 40.50 10/26/2014 1409   CHOLHDL 3 10/26/2014 1409   CHOLHDL 3.6 CALC 04/22/2006 0854   VLDL 21.8 10/26/2014 1409   LDLCALC 56 10/26/2014 1409     Past Medical History  Diagnosis Date  . CAD (coronary artery disease)     CABG x 2 with LIMA to Circumflex and vein graft to PDA  07/13/1991  . History of transient ischemic attack (TIA)   . Hyperlipidemia   . Peripheral neuropathy   . PVD (peripheral vascular disease) with claudication   . Carotid bruit     bilateral  . Ventral hernia   . Personal history of prostate cancer   . Diverticulosis   . ALLERGIC RHINITIS   . Osteoarthritis   . COPD (chronic obstructive pulmonary disease)   .  Tobacco use disorder, continuous   . STEMI (ST elevation myocardial infarction) 04/29/2014    PTCA Lmain and CFX, in setting of VF/VT arrest and CGS    Past Surgical History  Procedure Laterality Date  . Coronary artery bypass graft  1992  . Lumbar laminectomy  [id number]     X2  . Prostatectomy  05/2008    Dr. Alinda Money and XRT  . Appendectomy    . Tonsillectomy    . Left heart catheterization with coronary angiogram N/A 04/30/2014    Procedure: LEFT HEART CATHETERIZATION WITH  CORONARY ANGIOGRAM;  Surgeon: Peter M Martinique, MD; Lmain 90% (s/p PTCA), LAD 80%, CFX 100% (s/p PTCA), RCA OK, LIMA-LAD atretic, SVG-OM 100% (chronic), EF 45%, pt req defib x 13 for VF/VT arrest, CHB w/ tem pacer, IABP and intubation  . Coronary artery bypass graft N/A 05/28/2014    Procedure: REDO CORONARY ARTERY BYPASS GRAFTING (CABG);  Surgeon: Grace Isaac, MD;  Location: [place];  Service: Open Heart Surgery;  Laterality: N/A;  Times 4 using right internal mammary artery to Intermediate artery and endoscopically harvested left saphenous vein to LAD, OM1, and PD coronary arteries.  Darden Dates without cardioversion N/A 05/28/2014    Procedure: TRANSESOPHAGEAL ECHOCARDIOGRAM (TEE);  Surgeon: Grace Isaac, MD;  Location: [place];  Service: Open Heart Surgery;  Laterality: N/A;  . Tee without cardioversion N/A 06/24/2014    Procedure: TRANSESOPHAGEAL ECHOCARDIOGRAM (TEE);  Surgeon: Pixie Casino, MD;  Location[facility] ENDOSCOPY;  Service: Cardiovascular;  Laterality: N/A;  . Cardioversion N/A 06/24/2014    Procedure: CARDIOVERSION;  Surgeon: Pixie Casino, MD;  Location: [facility] ENDOSCOPY;  Service: Cardiovascular;  Laterality: N/A;    Current Outpatient Prescriptions  Medication Sig Dispense Refill  . amiodarone (PACERONE) 200 MG tablet Take 1 tablet (200 mg total) by mouth daily. 90 tablet 3  . apixaban (ELIQUIS) 5 MG TABS tablet Take 1 tablet (5 mg total) by mouth 2 (two) times daily. 180 tablet 3  . atorvastatin (LIPITOR) 40 MG tablet Take 1 tablet (40 mg total) by mouth at bedtime. 90 tablet 3  . clopidogrel (PLAVIX) 75 MG tablet Take 1 tablet (75 mg total) by mouth daily. 90 tablet 3  . feeding supplement, ENSURE COMPLETE, (ENSURE COMPLETE) LIQD Take 237 mLs by mouth 2 (two) times daily between meals.    . ferrous gluconate (FERGON) 324 MG tablet Take 1 tablet (324 mg total) by mouth 2 (two) times daily with a meal. 180 tablet 0  . fluticasone (FLONASE) 50 MCG/ACT nasal spray Place 1 spray into both  nostrils as needed for allergies. 16 g 3  . folic acid (FOLVITE) 1 MG tablet Take 1 tablet (1 mg total) by mouth daily. 90 tablet 3  . furosemide (LASIX) 20 MG tablet Take 1 tablet (20 mg total) by mouth every other day. 90 tablet 1  . loratadine (CLARITIN) 10 MG tablet Take 10 mg by mouth daily as needed for allergies.     . metoprolol (LOPRESSOR) 50 MG tablet Take 1 tablet (50 mg total) by mouth 2 (two) times daily. 180 tablet 3  . nitroGLYCERIN (NITROSTAT) 0.4 MG SL tablet Place 0.4 mg under the tongue every 5 (five) minutes as needed for chest pain (MAX 3 TABLETS).     . pantoprazole (PROTONIX) 40 MG tablet Take 1 tablet (40 mg total) by mouth daily. 90 tablet 3  . potassium chloride (KLOR-CON) 8 MEQ tablet Take 1 tablet (8 mEq total) by mouth daily. 90 tablet 3  .  traMADol (ULTRAM) 50 MG tablet Take 1-2 tablets (50-100 mg total) by mouth every 6 (six) hours as needed for moderate pain or severe pain. 30 tablet 0  . zolpidem (AMBIEN) 5 MG tablet Take 1 tablet (5 mg total) by mouth at bedtime as needed for sleep. 30 tablet 3   No current facility-administered medications for this visit.    Allergies  Allergen Reactions  . Adhesive [Tape] Other (See Comments)    Takes skin off   . Codeine Sulfate Itching and Other (See Comments)    headaches    History   Social History  . Marital Status: Married    Spouse Name: N/A  . Number of Children: N/A  . Years of Education: N/A   Occupational History  . Retired-self employed    Social History Main Topics  . Smoking status: Current Every Day Smoker -- 40 years    Types: Cigarettes  . Smokeless tobacco: Not on file  . Alcohol Use: No  . Drug Use: No  . Sexual Activity: Yes   Other Topics Concern  . Not on file   Social History Narrative    Family History  Problem Relation Age of Onset  . Hypertension    . Cancer Mother     Colon  . Heart disease Father   . Coronary artery disease Father   . Cancer Sister   . Heart disease  Brother     Review of Systems:  As stated in the HPI and otherwise negative.   BP 116/52 mmHg  Pulse 99  Wt 167 lb 4 oz (75.864 kg)  SpO2 98%  Physical Examination: General: Well developed, well nourished, NAD HEENT: OP clear, mucus membranes moist SKIN: warm, dry. No rashes. Neuro: No focal deficits Musculoskeletal: Muscle strength 5/5 all ext Psychiatric: Mood and affect normal Neck: No JVD, no carotid bruits, no thyromegaly, no lymphadenopathy. Lungs:Clear bilaterally, no wheezes, rhonci, crackles Cardiovascular: Regular rate and rhythm. No murmurs, gallops or rubs. Abdomen:Soft. Bowel sounds present. Non-tender.  Extremities: No lower extremity edema. Pulses are 1-2 + in the bilateral DP/PT.  EKG:  EKG is not ordered today. The ekg ordered today demonstrates   Recent Labs: 05/29/2014: Magnesium 2.6* 10/26/2014: ALT 44; BUN 17; Creatinine, Ser 1.18; Hemoglobin 14.2; Platelets 229.0; Potassium 5.0; Sodium 136; TSH 1.98   Lipid Panel    Component Value Date/Time   CHOL 118 10/26/2014 1409   TRIG 109.0 10/26/2014 1409   TRIG 91 04/22/2006 0854   HDL 40.50 10/26/2014 1409   CHOLHDL 3 10/26/2014 1409   CHOLHDL 3.6 CALC 04/22/2006 0854   VLDL 21.8 10/26/2014 1409   LDLCALC 56 10/26/2014 1409     Wt Readings from Last 3 Encounters:  11/04/14 167 lb 4 oz (75.864 kg)  10/26/14 167 lb (75.751 kg)  09/23/14 163 lb 2.3 oz (74 kg)     Other studies Reviewed: Additional studies/ records that were reviewed today include: . Review of the above records demonstrates:    Assessment and Plan:   1. CAD: Stable post bypass. Continue statin and beta blocker.   2. PAD: Stable. ABI June 2016 stable.   3. Atrial fibrillation/flutter: Atrial fobflutter today. HR is controlled. Continue amiodarone and Lopressor. He is being anti-coagulated with Eliquis. No bleeding issues. Recent cardioversion 06/24/14 but back in atrial fib. No plans to repeat cardioversion at this time as he is rate  controlled and doing well.   4. Tobacco abuse: smoking cessation recommended.   Current medicines are reviewed at  length with the patient today.  The patient does not have concerns regarding medicines.  The following changes have been made:  no change  Labs/ tests ordered today include:   No orders of the defined types were placed in this encounter.    Disposition:   FU with me in 8 weeks  Signed, Lauree Chandler, MD 11/04/2014 1:43 PM    [facility] [address], [place], [place]  [address] Phone: [phone]; Fax: [phone]

## 2014-11-04 NOTE — Patient Instructions (Signed)
Medication Instructions:  Your physician recommends that you continue on your current medications as directed. Please refer to the Current Medication list given to you today.   Labwork: none  Testing/Procedures: none  Follow-Up: Your physician wants you to follow-up in: 6 months.  You will receive a reminder letter in the mail two months in advance. If you don't receive a letter, please call our office to schedule the follow-up appointment.       

## 2014-11-08 ENCOUNTER — Ambulatory Visit (HOSPITAL_COMMUNITY): Payer: Medicare Other

## 2014-11-08 ENCOUNTER — Encounter (HOSPITAL_COMMUNITY)
Admission: RE | Admit: 2014-11-08 | Discharge: 2014-11-08 | Disposition: A | Discharge status: Home / Self Care | Payer: Medicare Other | Source: Ambulatory Visit | Attending: Cardiovascular Disease | Admitting: Cardiovascular Disease

## 2014-11-08 DIAGNOSIS — Z48812 Encounter for surgical aftercare following surgery on the circulatory system: Secondary | ICD-10-CM | POA: Insufficient documentation

## 2014-11-08 DIAGNOSIS — Z951 Presence of aortocoronary bypass graft: Secondary | ICD-10-CM | POA: Insufficient documentation

## 2014-11-10 ENCOUNTER — Ambulatory Visit (HOSPITAL_COMMUNITY): Payer: Medicare Other

## 2014-11-10 ENCOUNTER — Encounter (HOSPITAL_COMMUNITY)
Admission: RE | Admit: 2014-11-10 | Discharge: 2014-11-10 | Disposition: A | Discharge status: Home / Self Care | Payer: Medicare Other | Source: Ambulatory Visit | Attending: Cardiovascular Disease | Admitting: Cardiovascular Disease

## 2014-11-10 DIAGNOSIS — Z48812 Encounter for surgical aftercare following surgery on the circulatory system: Secondary | ICD-10-CM | POA: Diagnosis not present

## 2014-11-10 DIAGNOSIS — Z951 Presence of aortocoronary bypass graft: Secondary | ICD-10-CM | POA: Diagnosis not present

## 2014-11-15 ENCOUNTER — Ambulatory Visit (HOSPITAL_COMMUNITY): Payer: Medicare Other

## 2014-11-15 ENCOUNTER — Encounter (HOSPITAL_COMMUNITY)
Admission: RE | Admit: 2014-11-15 | Discharge: 2014-11-15 | Disposition: A | Discharge status: Home / Self Care | Payer: Medicare Other | Source: Ambulatory Visit | Attending: Cardiovascular Disease | Admitting: Cardiovascular Disease

## 2014-11-15 DIAGNOSIS — Z48812 Encounter for surgical aftercare following surgery on the circulatory system: Secondary | ICD-10-CM | POA: Diagnosis not present

## 2014-11-15 DIAGNOSIS — Z951 Presence of aortocoronary bypass graft: Secondary | ICD-10-CM | POA: Diagnosis not present

## 2014-11-17 ENCOUNTER — Ambulatory Visit (HOSPITAL_COMMUNITY): Payer: Medicare Other

## 2014-11-17 ENCOUNTER — Encounter (HOSPITAL_COMMUNITY)
Admission: RE | Admit: 2014-11-17 | Discharge: 2014-11-17 | Disposition: A | Discharge status: Home / Self Care | Payer: Medicare Other | Source: Ambulatory Visit | Attending: Cardiovascular Disease | Admitting: Cardiovascular Disease

## 2014-11-17 DIAGNOSIS — Z951 Presence of aortocoronary bypass graft: Secondary | ICD-10-CM | POA: Diagnosis not present

## 2014-11-17 DIAGNOSIS — Z48812 Encounter for surgical aftercare following surgery on the circulatory system: Secondary | ICD-10-CM | POA: Diagnosis not present

## 2014-11-22 ENCOUNTER — Encounter (HOSPITAL_COMMUNITY)
Admission: RE | Admit: 2014-11-22 | Discharge: 2014-11-22 | Disposition: A | Discharge status: Home / Self Care | Payer: Medicare Other | Source: Ambulatory Visit | Attending: Cardiovascular Disease | Admitting: Cardiovascular Disease

## 2014-11-22 ENCOUNTER — Ambulatory Visit (HOSPITAL_COMMUNITY): Payer: Medicare Other

## 2014-11-22 DIAGNOSIS — Z951 Presence of aortocoronary bypass graft: Secondary | ICD-10-CM | POA: Diagnosis not present

## 2014-11-22 DIAGNOSIS — Z48812 Encounter for surgical aftercare following surgery on the circulatory system: Secondary | ICD-10-CM | POA: Diagnosis not present

## 2014-11-22 NOTE — Progress Notes (Signed)
  60 day Psychosocial followup assessment  Patient psychosocial assessment reveals no barriers to cardiac rehab participation.  Psychosocial areas that are affecting patient's rehab experience include concerns about his caregiver role for his wife.   Patient  Does continue to exhibit positive coping skills to deal with psychosocial concerns.  Patient does feel making progress towards cardiac rehab goals.  Patient reports health and activity level have  improved in the past 30 days as evidenced by patient's reports of ability to be more active at home and in the community.  Pt also reports decreased falls since he completed the [facility] program and cardiac rehab participation.    Patient reports feeling positive about current and projected progress toward cardiac rehab goals.  Pt is currently exercising at home doing stretching walking and dancing as instructed by his physical therapist.  Patient's rate of progress towards goals is good .  Plan of action to help patient continue to work towards rehab goals include continued cardiac rehab participation and home exercise.  Will continue to monitor and evaluate progress toward psychosocial goals. Pt does continue to smoke and has no desire to quit.   Goals in progress:  Help patient work toward returning to meaningful activities that improve patient's quality of life and are attainable.

## 2014-11-24 ENCOUNTER — Encounter (HOSPITAL_COMMUNITY)
Admission: RE | Admit: 2014-11-24 | Discharge: 2014-11-24 | Disposition: A | Discharge status: Home / Self Care | Payer: Medicare Other | Source: Ambulatory Visit | Attending: Cardiovascular Disease | Admitting: Cardiovascular Disease

## 2014-11-24 ENCOUNTER — Ambulatory Visit (HOSPITAL_COMMUNITY): Payer: Medicare Other

## 2014-11-24 DIAGNOSIS — Z951 Presence of aortocoronary bypass graft: Secondary | ICD-10-CM | POA: Diagnosis not present

## 2014-11-24 DIAGNOSIS — Z48812 Encounter for surgical aftercare following surgery on the circulatory system: Secondary | ICD-10-CM | POA: Diagnosis not present

## 2014-11-29 ENCOUNTER — Encounter (HOSPITAL_COMMUNITY): Payer: Medicare Other

## 2014-11-29 ENCOUNTER — Ambulatory Visit (HOSPITAL_COMMUNITY): Payer: Medicare Other

## 2014-12-01 ENCOUNTER — Encounter (HOSPITAL_COMMUNITY)
Admission: RE | Admit: 2014-12-01 | Discharge: 2014-12-01 | Disposition: A | Discharge status: Home / Self Care | Payer: Medicare Other | Source: Ambulatory Visit | Attending: Cardiovascular Disease | Admitting: Cardiovascular Disease

## 2014-12-01 ENCOUNTER — Ambulatory Visit (HOSPITAL_COMMUNITY): Payer: Medicare Other

## 2014-12-01 DIAGNOSIS — Z48812 Encounter for surgical aftercare following surgery on the circulatory system: Secondary | ICD-10-CM | POA: Diagnosis not present

## 2014-12-01 DIAGNOSIS — Z951 Presence of aortocoronary bypass graft: Secondary | ICD-10-CM | POA: Diagnosis not present

## 2014-12-06 ENCOUNTER — Encounter (HOSPITAL_COMMUNITY)
Admission: RE | Admit: 2014-12-06 | Discharge: 2014-12-06 | Disposition: A | Discharge status: Home / Self Care | Payer: Medicare Other | Source: Ambulatory Visit | Attending: Cardiovascular Disease | Admitting: Cardiovascular Disease

## 2014-12-06 ENCOUNTER — Ambulatory Visit (HOSPITAL_COMMUNITY): Payer: Medicare Other

## 2014-12-06 DIAGNOSIS — Z48812 Encounter for surgical aftercare following surgery on the circulatory system: Secondary | ICD-10-CM | POA: Diagnosis not present

## 2014-12-06 DIAGNOSIS — Z951 Presence of aortocoronary bypass graft: Secondary | ICD-10-CM | POA: Diagnosis not present

## 2014-12-08 ENCOUNTER — Encounter (HOSPITAL_COMMUNITY)
Admission: RE | Admit: 2014-12-08 | Discharge: 2014-12-08 | Disposition: A | Discharge status: Home / Self Care | Payer: Medicare Other | Source: Ambulatory Visit | Attending: Cardiovascular Disease | Admitting: Cardiovascular Disease

## 2014-12-08 ENCOUNTER — Ambulatory Visit (HOSPITAL_COMMUNITY): Payer: Medicare Other

## 2014-12-08 DIAGNOSIS — Z48812 Encounter for surgical aftercare following surgery on the circulatory system: Secondary | ICD-10-CM | POA: Diagnosis not present

## 2014-12-08 DIAGNOSIS — Z951 Presence of aortocoronary bypass graft: Secondary | ICD-10-CM | POA: Diagnosis not present

## 2014-12-13 ENCOUNTER — Encounter (HOSPITAL_COMMUNITY): Payer: Medicare Other

## 2014-12-13 ENCOUNTER — Ambulatory Visit (HOSPITAL_COMMUNITY): Payer: Medicare Other

## 2014-12-15 ENCOUNTER — Encounter (HOSPITAL_COMMUNITY)
Admission: RE | Admit: 2014-12-15 | Discharge: 2014-12-15 | Disposition: A | Discharge status: Home / Self Care | Payer: Medicare Other | Source: Ambulatory Visit | Attending: Cardiovascular Disease | Admitting: Cardiovascular Disease

## 2014-12-15 ENCOUNTER — Ambulatory Visit (HOSPITAL_COMMUNITY): Payer: Medicare Other

## 2014-12-15 DIAGNOSIS — Z951 Presence of aortocoronary bypass graft: Secondary | ICD-10-CM | POA: Insufficient documentation

## 2014-12-15 DIAGNOSIS — Z48812 Encounter for surgical aftercare following surgery on the circulatory system: Secondary | ICD-10-CM | POA: Diagnosis not present

## 2014-12-20 ENCOUNTER — Ambulatory Visit (HOSPITAL_COMMUNITY): Payer: Medicare Other

## 2014-12-20 ENCOUNTER — Encounter (HOSPITAL_COMMUNITY)
Admission: RE | Admit: 2014-12-20 | Discharge: 2014-12-20 | Disposition: A | Discharge status: Home / Self Care | Payer: Medicare Other | Source: Ambulatory Visit | Attending: Cardiovascular Disease | Admitting: Cardiovascular Disease

## 2014-12-20 DIAGNOSIS — Z48812 Encounter for surgical aftercare following surgery on the circulatory system: Secondary | ICD-10-CM | POA: Diagnosis not present

## 2014-12-20 DIAGNOSIS — Z951 Presence of aortocoronary bypass graft: Secondary | ICD-10-CM | POA: Diagnosis not present

## 2014-12-22 ENCOUNTER — Encounter (HOSPITAL_COMMUNITY)
Admission: RE | Admit: 2014-12-22 | Discharge: 2014-12-22 | Disposition: A | Discharge status: Home / Self Care | Payer: Medicare Other | Source: Ambulatory Visit | Attending: Cardiovascular Disease | Admitting: Cardiovascular Disease

## 2014-12-22 ENCOUNTER — Ambulatory Visit (HOSPITAL_COMMUNITY): Payer: Medicare Other

## 2014-12-22 DIAGNOSIS — Z951 Presence of aortocoronary bypass graft: Secondary | ICD-10-CM | POA: Diagnosis not present

## 2014-12-22 DIAGNOSIS — Z48812 Encounter for surgical aftercare following surgery on the circulatory system: Secondary | ICD-10-CM | POA: Diagnosis not present

## 2014-12-27 ENCOUNTER — Encounter (HOSPITAL_COMMUNITY)
Admission: RE | Admit: 2014-12-27 | Discharge: 2014-12-27 | Disposition: A | Discharge status: Home / Self Care | Payer: Medicare Other | Source: Ambulatory Visit | Attending: Cardiovascular Disease | Admitting: Cardiovascular Disease

## 2014-12-27 ENCOUNTER — Ambulatory Visit (HOSPITAL_COMMUNITY): Payer: Medicare Other

## 2014-12-27 DIAGNOSIS — Z951 Presence of aortocoronary bypass graft: Secondary | ICD-10-CM | POA: Diagnosis not present

## 2014-12-27 DIAGNOSIS — Z48812 Encounter for surgical aftercare following surgery on the circulatory system: Secondary | ICD-10-CM | POA: Diagnosis not present

## 2014-12-29 ENCOUNTER — Encounter (HOSPITAL_COMMUNITY)
Admission: RE | Admit: 2014-12-29 | Discharge: 2014-12-29 | Disposition: A | Discharge status: Home / Self Care | Payer: Medicare Other | Source: Ambulatory Visit | Attending: Cardiovascular Disease | Admitting: Cardiovascular Disease

## 2014-12-29 ENCOUNTER — Ambulatory Visit (HOSPITAL_COMMUNITY): Payer: Medicare Other

## 2014-12-29 DIAGNOSIS — Z951 Presence of aortocoronary bypass graft: Secondary | ICD-10-CM | POA: Diagnosis not present

## 2014-12-29 DIAGNOSIS — Z48812 Encounter for surgical aftercare following surgery on the circulatory system: Secondary | ICD-10-CM | POA: Diagnosis not present

## 2014-12-29 NOTE — Progress Notes (Signed)
  90 day Psychosocial follow up assessment  Patient psychosocial assessment reveals no barriers to cardiac rehab participation.  Psychosocial areas that are affecting patient's rehab experience include concerns about caring for his wife who has alzheimer's.    Patient  Does continue to exhibit positive coping skills to deal with psychosocial concerns.  Patient does feel making progress towards cardiac rehab goals.  Patient reports health and activity level has improved in the past 30 days as evidenced by patient's reports of increased ability to walk further distances and increased strength. However pt does to continue to have weakness and balance problems, which are chronic for him.  Pt reports he does use rollator when walking long distances such as shopping or going to church, however only uses cane when walking to check the mail.  Pt reports some staggering gait when using the cane.  Pt encouraged to use rolling walker or rollator when walking outside to improve balance and fall prevention.  Patient reports feeling positive about current and projected progress toward cardiac rehab goals.  Patient's rate of progress towards goals is fair .  Pt is not currently exercising on his own on when not present at cardiac rehab and pt has no desire to stop smoking. Plan of action to help patient continue to work towards rehab goals include continued encouragement to develop home exercise routine.    Will continue to monitor and evaluate progress toward psychosocial goals.   Goals in progress:  Help patient work toward returning to meaningful activities that improve patient's quality of life and are attainable.

## 2015-01-03 ENCOUNTER — Encounter (HOSPITAL_COMMUNITY)
Admission: RE | Admit: 2015-01-03 | Discharge: 2015-01-03 | Disposition: A | Discharge status: Home / Self Care | Payer: Medicare Other | Source: Ambulatory Visit | Attending: Cardiovascular Disease | Admitting: Cardiovascular Disease

## 2015-01-03 ENCOUNTER — Ambulatory Visit (HOSPITAL_COMMUNITY): Payer: Medicare Other

## 2015-01-03 DIAGNOSIS — Z951 Presence of aortocoronary bypass graft: Secondary | ICD-10-CM | POA: Diagnosis not present

## 2015-01-03 DIAGNOSIS — Z48812 Encounter for surgical aftercare following surgery on the circulatory system: Secondary | ICD-10-CM | POA: Diagnosis not present

## 2015-01-05 ENCOUNTER — Encounter (HOSPITAL_COMMUNITY)
Admission: RE | Admit: 2015-01-05 | Discharge: 2015-01-05 | Disposition: A | Discharge status: Home / Self Care | Payer: Medicare Other | Source: Ambulatory Visit | Attending: Cardiovascular Disease | Admitting: Cardiovascular Disease

## 2015-01-05 ENCOUNTER — Ambulatory Visit (HOSPITAL_COMMUNITY): Payer: Medicare Other

## 2015-01-05 DIAGNOSIS — Z48812 Encounter for surgical aftercare following surgery on the circulatory system: Secondary | ICD-10-CM | POA: Diagnosis not present

## 2015-01-05 DIAGNOSIS — Z951 Presence of aortocoronary bypass graft: Secondary | ICD-10-CM | POA: Diagnosis not present

## 2015-01-10 ENCOUNTER — Ambulatory Visit (HOSPITAL_COMMUNITY): Payer: Medicare Other

## 2015-01-10 ENCOUNTER — Encounter (HOSPITAL_COMMUNITY)
Admission: RE | Admit: 2015-01-10 | Discharge: 2015-01-10 | Disposition: A | Discharge status: Home / Self Care | Payer: Medicare Other | Source: Ambulatory Visit | Attending: Cardiovascular Disease | Admitting: Cardiovascular Disease

## 2015-01-10 DIAGNOSIS — Z951 Presence of aortocoronary bypass graft: Secondary | ICD-10-CM | POA: Diagnosis not present

## 2015-01-10 DIAGNOSIS — Z48812 Encounter for surgical aftercare following surgery on the circulatory system: Secondary | ICD-10-CM | POA: Diagnosis not present

## 2015-01-12 ENCOUNTER — Encounter (HOSPITAL_COMMUNITY)
Admission: RE | Admit: 2015-01-12 | Discharge: 2015-01-12 | Disposition: A | Discharge status: Home / Self Care | Payer: Medicare Other | Source: Ambulatory Visit | Attending: Cardiovascular Disease | Admitting: Cardiovascular Disease

## 2015-01-12 DIAGNOSIS — Z951 Presence of aortocoronary bypass graft: Secondary | ICD-10-CM | POA: Diagnosis not present

## 2015-01-12 DIAGNOSIS — H31001 Unspecified chorioretinal scars, right eye: Secondary | ICD-10-CM | POA: Diagnosis not present

## 2015-01-12 DIAGNOSIS — H2512 Age-related nuclear cataract, left eye: Secondary | ICD-10-CM | POA: Diagnosis not present

## 2015-01-12 DIAGNOSIS — Z48812 Encounter for surgical aftercare following surgery on the circulatory system: Secondary | ICD-10-CM | POA: Diagnosis not present

## 2015-01-12 DIAGNOSIS — H33102 Unspecified retinoschisis, left eye: Secondary | ICD-10-CM | POA: Diagnosis not present

## 2015-01-12 DIAGNOSIS — H43391 Other vitreous opacities, right eye: Secondary | ICD-10-CM | POA: Diagnosis not present

## 2015-01-12 DIAGNOSIS — Z961 Presence of intraocular lens: Secondary | ICD-10-CM | POA: Diagnosis not present

## 2015-01-17 ENCOUNTER — Encounter (HOSPITAL_COMMUNITY): Payer: Medicare Other

## 2015-01-18 DIAGNOSIS — Z961 Presence of intraocular lens: Secondary | ICD-10-CM | POA: Insufficient documentation

## 2015-01-18 DIAGNOSIS — H35033 Hypertensive retinopathy, bilateral: Secondary | ICD-10-CM | POA: Diagnosis not present

## 2015-01-18 DIAGNOSIS — H35371 Puckering of macula, right eye: Secondary | ICD-10-CM | POA: Diagnosis not present

## 2015-01-18 DIAGNOSIS — H2511 Age-related nuclear cataract, right eye: Secondary | ICD-10-CM | POA: Diagnosis not present

## 2015-01-18 DIAGNOSIS — H353290 Exudative age-related macular degeneration, unspecified eye, stage unspecified: Secondary | ICD-10-CM | POA: Insufficient documentation

## 2015-01-19 ENCOUNTER — Encounter (HOSPITAL_COMMUNITY)
Admission: RE | Admit: 2015-01-19 | Discharge: 2015-01-19 | Disposition: A | Discharge status: Home / Self Care | Payer: Medicare Other | Source: Ambulatory Visit | Attending: Cardiovascular Disease | Admitting: Cardiovascular Disease

## 2015-01-19 DIAGNOSIS — Z951 Presence of aortocoronary bypass graft: Secondary | ICD-10-CM | POA: Diagnosis not present

## 2015-01-19 DIAGNOSIS — Z48812 Encounter for surgical aftercare following surgery on the circulatory system: Secondary | ICD-10-CM | POA: Diagnosis not present

## 2015-01-24 ENCOUNTER — Encounter (HOSPITAL_COMMUNITY)
Admission: RE | Admit: 2015-01-24 | Discharge: 2015-01-24 | Disposition: A | Discharge status: Home / Self Care | Payer: Medicare Other | Source: Ambulatory Visit | Attending: Cardiovascular Disease | Admitting: Cardiovascular Disease

## 2015-01-24 DIAGNOSIS — Z48812 Encounter for surgical aftercare following surgery on the circulatory system: Secondary | ICD-10-CM | POA: Diagnosis not present

## 2015-01-24 DIAGNOSIS — Z951 Presence of aortocoronary bypass graft: Secondary | ICD-10-CM | POA: Diagnosis not present

## 2015-01-26 ENCOUNTER — Encounter (HOSPITAL_COMMUNITY)
Admission: RE | Admit: 2015-01-26 | Discharge: 2015-01-26 | Disposition: A | Discharge status: Home / Self Care | Payer: Medicare Other | Source: Ambulatory Visit | Attending: Cardiovascular Disease | Admitting: Cardiovascular Disease

## 2015-01-26 ENCOUNTER — Ambulatory Visit: Payer: Medicare Other | Admitting: Internal Medicine

## 2015-01-26 ENCOUNTER — Encounter (HOSPITAL_COMMUNITY): Payer: Self-pay

## 2015-01-26 DIAGNOSIS — Z48812 Encounter for surgical aftercare following surgery on the circulatory system: Secondary | ICD-10-CM | POA: Diagnosis not present

## 2015-01-26 DIAGNOSIS — Z951 Presence of aortocoronary bypass graft: Secondary | ICD-10-CM | POA: Diagnosis not present

## 2015-01-26 NOTE — Progress Notes (Addendum)
Pt graduated from cardiac rehab program today with completion of 36 exercise sessions in Phase II. Pt maintained good attendance and progressed nicely during his participation in rehab as evidenced by increased MET level.   Medication list reconciled. Repeat  PHQ score-0.  Pt has made significant lifestyle changes and should be commended for his success. Pt feels he has achieved his goals during cardiac rehab, which include increased strength, stamina with fewer falls.  Pt does feel he needs to continue working on his leg strength and balance.  Pt does use a rolling walker and cane at home for stability.  Pt is not currently exercising on his own at home.  Pt encouraged to do so.  Pt continues to smoke despite his efforts to quit.

## 2015-02-08 ENCOUNTER — Encounter: Payer: Self-pay | Admitting: Internal Medicine

## 2015-02-08 ENCOUNTER — Ambulatory Visit (INDEPENDENT_AMBULATORY_CARE_PROVIDER_SITE_OTHER): Payer: Medicare Other | Admitting: Internal Medicine

## 2015-02-08 ENCOUNTER — Other Ambulatory Visit (INDEPENDENT_AMBULATORY_CARE_PROVIDER_SITE_OTHER): Payer: Medicare Other

## 2015-02-08 VITALS — BP 124/72 | HR 99 | Wt 175.0 lb

## 2015-02-08 DIAGNOSIS — I255 Ischemic cardiomyopathy: Secondary | ICD-10-CM

## 2015-02-08 DIAGNOSIS — M5440 Lumbago with sciatica, unspecified side: Secondary | ICD-10-CM

## 2015-02-08 DIAGNOSIS — Z23 Encounter for immunization: Secondary | ICD-10-CM | POA: Diagnosis not present

## 2015-02-08 DIAGNOSIS — I110 Hypertensive heart disease with heart failure: Secondary | ICD-10-CM

## 2015-02-08 DIAGNOSIS — I739 Peripheral vascular disease, unspecified: Secondary | ICD-10-CM

## 2015-02-08 DIAGNOSIS — I2510 Atherosclerotic heart disease of native coronary artery without angina pectoris: Secondary | ICD-10-CM | POA: Diagnosis not present

## 2015-02-08 DIAGNOSIS — G8929 Other chronic pain: Secondary | ICD-10-CM

## 2015-02-08 DIAGNOSIS — Z9861 Coronary angioplasty status: Secondary | ICD-10-CM

## 2015-02-08 LAB — BASIC METABOLIC PANEL
BUN: 18 mg/dL (ref 6–23)
CHLORIDE: 101 meq/L (ref 96–112)
CO2: 31 meq/L (ref 19–32)
Calcium: 9.5 mg/dL (ref 8.4–10.5)
Creatinine, Ser: 1.15 mg/dL (ref 0.40–1.50)
GFR: 65.7 mL/min (ref >60.00)
Glucose, Bld: 64 mg/dL — ABNORMAL LOW (ref 70–99)
Potassium: 4.4 mEq/L (ref 3.5–5.1)
SODIUM: 139 meq/L (ref 135–145)

## 2015-02-08 NOTE — Assessment & Plan Note (Signed)
Plavix, Lipitor, Eliquis 

## 2015-02-08 NOTE — Assessment & Plan Note (Signed)
Chronic  Toprol, Furosemide

## 2015-02-08 NOTE — Progress Notes (Signed)
Subjective:  Patient ID: [name], male    DOB: 12/08/1940  Age: 74 y.o. MRN: [id number]  CC: No chief complaint on file.   HPI [name] presents for CAD, COPD, LBP f/u  Outpatient Prescriptions Prior to Visit  Medication Sig Dispense Refill  . amiodarone (PACERONE) 200 MG tablet Take 1 tablet (200 mg total) by mouth daily. 90 tablet 3  . apixaban (ELIQUIS) 5 MG TABS tablet Take 1 tablet (5 mg total) by mouth 2 (two) times daily. 180 tablet 3  . atorvastatin (LIPITOR) 40 MG tablet Take 1 tablet (40 mg total) by mouth at bedtime. 90 tablet 3  . clopidogrel (PLAVIX) 75 MG tablet Take 1 tablet (75 mg total) by mouth daily. 90 tablet 3  . feeding supplement, ENSURE COMPLETE, (ENSURE COMPLETE) LIQD Take 237 mLs by mouth 2 (two) times daily between meals.    . ferrous gluconate (FERGON) 324 MG tablet Take 1 tablet (324 mg total) by mouth 2 (two) times daily with a meal. 180 tablet 0  . fluticasone (FLONASE) 50 MCG/ACT nasal spray Place 1 spray into both nostrils as needed for allergies. 16 g 3  . folic acid (FOLVITE) 1 MG tablet Take 1 tablet (1 mg total) by mouth daily. 90 tablet 3  . furosemide (LASIX) 20 MG tablet Take 1 tablet (20 mg total) by mouth every other day. 90 tablet 1  . loratadine (CLARITIN) 10 MG tablet Take 10 mg by mouth daily as needed for allergies.     . metoprolol (LOPRESSOR) 50 MG tablet Take 1 tablet (50 mg total) by mouth 2 (two) times daily. 180 tablet 3  . nitroGLYCERIN (NITROSTAT) 0.4 MG SL tablet Place 0.4 mg under the tongue every 5 (five) minutes as needed for chest pain (MAX 3 TABLETS).     . pantoprazole (PROTONIX) 40 MG tablet Take 1 tablet (40 mg total) by mouth daily. 90 tablet 3  . potassium chloride (KLOR-CON) 8 MEQ tablet Take 1 tablet (8 mEq total) by mouth daily. 90 tablet 3  . traMADol (ULTRAM) 50 MG tablet Take 1-2 tablets (50-100 mg total) by mouth every 6 (six) hours as needed for moderate pain or severe pain. 30 tablet 0  .  zolpidem (AMBIEN) 5 MG tablet Take 1 tablet (5 mg total) by mouth at bedtime as needed for sleep. 30 tablet 3   No facility-administered medications prior to visit.    ROS Review of Systems  Constitutional: Positive for fatigue. Negative for appetite change and unexpected weight change.  HENT: Negative for congestion, nosebleeds, sneezing, sore throat and trouble swallowing.   Eyes: Negative for itching and visual disturbance.  Respiratory: Negative for cough.   Cardiovascular: Negative for chest pain, palpitations and leg swelling.  Gastrointestinal: Negative for nausea, diarrhea, blood in stool and abdominal distention.  Genitourinary: Negative for frequency and hematuria.  Musculoskeletal: Positive for back pain, arthralgias, gait problem, neck pain and neck stiffness. Negative for joint swelling.  Skin: Positive for rash.  Neurological: Negative for dizziness, tremors, speech difficulty and weakness.  Psychiatric/Behavioral: Negative for sleep disturbance, dysphoric mood and agitation. The patient is not nervous/anxious.     Objective:  BP 124/72 mmHg  Pulse 99  Wt 175 lb (79.379 kg)  SpO2 96%  BP Readings from Last 3 Encounters:  02/08/15 124/72  11/04/14 116/52  10/26/14 110/72    Wt Readings from Last 3 Encounters:  02/08/15 175 lb (79.379 kg)  11/04/14 167 lb 4 oz (75.864 kg)  10/26/14  167 lb (75.751 kg)    Physical Exam  Constitutional: He is oriented to person, place, and time. He appears well-developed. No distress.  NAD  HENT:  Mouth/Throat: Oropharynx is clear and moist.  Eyes: Conjunctivae are normal. Pupils are equal, round, and reactive to light.  Neck: Normal range of motion. No JVD present. No thyromegaly present.  Cardiovascular: Normal rate, regular rhythm, normal heart sounds and intact distal pulses.  Exam reveals no gallop and no friction rub.   No murmur heard. Pulmonary/Chest: Effort normal and breath sounds normal. No respiratory distress. He  has no wheezes. He has no rales. He exhibits no tenderness.  Abdominal: Soft. Bowel sounds are normal. He exhibits no distension and no mass. There is no tenderness. There is no rebound and no guarding.  Musculoskeletal: Normal range of motion. He exhibits tenderness. He exhibits no edema.  Lymphadenopathy:    He has no cervical adenopathy.  Neurological: He is alert and oriented to person, place, and time. He has normal reflexes. No cranial nerve deficit. He exhibits normal muscle tone. He displays a negative Romberg sign. Coordination abnormal. Gait normal.  Skin: Skin is warm and dry. No rash noted.  Psychiatric: He has a normal mood and affect. His behavior is normal. Judgment and thought content normal.  cane  LS tender  Lab Results  Component Value Date   WBC 7.6 10/26/2014   HGB 14.2 10/26/2014   HCT 42.2 10/26/2014   PLT 229.0 10/26/2014   GLUCOSE 64* 02/08/2015   CHOL 118 10/26/2014   TRIG 109.0 10/26/2014   HDL 40.50 10/26/2014   LDLCALC 56 10/26/2014   ALT 44 10/26/2014   AST 28 10/26/2014   NA 139 02/08/2015   K 4.4 02/08/2015   CL 101 02/08/2015   CREATININE 1.15 02/08/2015   BUN 18 02/08/2015   CO2 31 02/08/2015   TSH 1.98 10/26/2014   PSA 0.01* 02/20/2012   INR 1.30 05/28/2014   HGBA1C 5.7 07/11/2006    No results found.  Assessment & Plan:   Diagnoses and all orders for this visit:  Hypertensive heart disease with heart failure ([place]) -     Basic metabolic panel; Future  Need for influenza vaccination -     Flu Vaccine QUAD 36+ mos IM   I am having [name] maintain his loratadine, nitroGLYCERIN, feeding supplement (ENSURE COMPLETE), potassium chloride, zolpidem, apixaban, atorvastatin, ferrous gluconate, traMADol, amiodarone, furosemide, folic acid, fluticasone, pantoprazole, clopidogrel, and metoprolol.  No orders of the defined types were placed in this encounter.     Follow-up: Return in about 4 months (around 06/08/2015) for a follow-up  visit.  Nathan Kehr, MD

## 2015-02-08 NOTE — Assessment & Plan Note (Signed)
Using a walker/cane

## 2015-02-08 NOTE — Progress Notes (Signed)
Pre visit review using our clinic review tool, if applicable. No additional management support is needed unless otherwise documented below in the visit note. 

## 2015-03-25 ENCOUNTER — Other Ambulatory Visit: Payer: Self-pay | Admitting: Internal Medicine

## 2015-05-10 ENCOUNTER — Other Ambulatory Visit: Payer: Self-pay | Admitting: *Deleted

## 2015-05-10 NOTE — Telephone Encounter (Signed)
OK to refill

## 2015-05-10 NOTE — Telephone Encounter (Signed)
[facility] requesting a refill on zolpidem 5 mg tablet. Please advise

## 2015-05-11 ENCOUNTER — Telehealth: Payer: Self-pay | Admitting: *Deleted

## 2015-05-11 MED ORDER — ZOLPIDEM TARTRATE 5 MG PO TABS: 5.0000 mg | ORAL_TABLET | Freq: Every evening | ORAL | Status: DC | PRN

## 2015-05-11 NOTE — Telephone Encounter (Signed)
Tried to call in rx for zolpidem. Per [facility], they do not accept verbal orders for this medication. Could you print an rx and I can fax it to them? I did not think that they would accept it if the providers signature was stamped. I was informed that they would, but the pharmacist may call to verify the rx. The fax number provided was [phone]. Thanks, MI

## 2015-05-12 MED ORDER — ZOLPIDEM TARTRATE 5 MG PO TABS: 5.0000 mg | ORAL_TABLET | Freq: Every evening | ORAL | Status: DC | PRN

## 2015-05-12 NOTE — Telephone Encounter (Signed)
Rx ordered, printed, and faxed to [phone].

## 2015-05-16 ENCOUNTER — Encounter: Payer: Self-pay | Admitting: Cardiovascular Disease

## 2015-05-16 ENCOUNTER — Ambulatory Visit (INDEPENDENT_AMBULATORY_CARE_PROVIDER_SITE_OTHER): Payer: Medicare Other | Admitting: Cardiovascular Disease

## 2015-05-16 VITALS — BP 122/62 | HR 70 | Ht 70.0 in | Wt 179.4 lb

## 2015-05-16 DIAGNOSIS — I739 Peripheral vascular disease, unspecified: Secondary | ICD-10-CM

## 2015-05-16 DIAGNOSIS — I779 Disorder of arteries and arterioles, unspecified: Secondary | ICD-10-CM | POA: Diagnosis not present

## 2015-05-16 DIAGNOSIS — Z720 Tobacco use: Secondary | ICD-10-CM

## 2015-05-16 DIAGNOSIS — I2510 Atherosclerotic heart disease of native coronary artery without angina pectoris: Secondary | ICD-10-CM

## 2015-05-16 DIAGNOSIS — I4819 Other persistent atrial fibrillation: Secondary | ICD-10-CM

## 2015-05-16 DIAGNOSIS — I481 Persistent atrial fibrillation: Secondary | ICD-10-CM

## 2015-05-16 NOTE — Progress Notes (Signed)
Chief Complaint  Patient presents with  . Extremity Weakness    History of Present Illness: [name] with history of CAD s/p CABG 1993 and redo 2016, PAD, hyperlipidemia, atrial fibrillation/flutter, prostate cancer s/p prostatectomy/radiation who is here today for cardiac follow up. He has been followed in our office since 2011. I initially followed his PAD only. He has had PTA of the occluded left SFA and stent placement May 2011. I then staged intervention of his right SFA severe stenosis and treated this with a [place] atherectomy device in June 2011. He is known to have moderate carotid artery disease. His CAD remained quiet from cath in 1999 until recently. In January 2016 he was admitted to [facility] with NSTEMI and atrial fib with RVR. Cardiac cath showed a critical left main stenosis, LAD, and Circumflex lesions with a left dominant system. The vein to his OM was occluded and the LIMA to the LAD was atretic. He developed cardiogenic shock requiring balloon pump and IV pressor support. He also suffered recurrent V- Fib, with CPR and multiple defibrillations performed. He required ventilator support for respiratory failure. He underwent partially successful PTCA of the distal left main and ostial circumflex. During that hospitalization, a TCTS consult was obtained for consideration of redo CABG, but patient was not felt to be a candidate at that time due to deconditioning. He was discharged to a nursing home on 05/14/2014. He initially did okay, however, he presented back to [facility] with recurrent chest pain on 05/20/14 and was admitted 2/11-2/27/16. He was again found to have a mildly elevated troponin and to be in atrial flutter. He converted to NSR with Amiodarone and was seen by Dr. Servando Snare. Decision was made to pursue re-do CABG (RIMA-Intermediate, S-OM, S-PDA, S-dist LAD). Postop course was notable for recurrent AFib/Flutter and was continued on Amiodarone. He was placed on  Eliquis for stroke prophylaxis.Readmitted 06/22/14 with weakness, atrial flutter. He underwent TEE guided cardioversion on 06/24/14. Carotid dopplers February 2016 with mild bilateral disease. Lower ext dopplers June 2015 with widely patent left SFA stent and 50% right SFA stenosis. Recent dizziness and elevated HR in rehab so he is added onto my schedule today. He was seen today by Dr. Servando Snare for wound check but other issues not addressed.   He is here today for follow up. No chest pains or SOB. Feeling well. No syncope. No lower ext swelling. He continues to smoke.  Primary Care Physician: Dr. Alain Marion.  Past Medical History  Diagnosis Date  . CAD (coronary artery disease)     CABG x 2 with LIMA to Circumflex and vein graft to PDA  07/13/1991  . History of transient ischemic attack (TIA)   . Hyperlipidemia   . Peripheral neuropathy ([place])   . PVD (peripheral vascular disease) with claudication ([place])   . Carotid bruit     bilateral  . Ventral hernia   . Personal history of prostate cancer   . Diverticulosis   . ALLERGIC RHINITIS   . Osteoarthritis   . COPD (chronic obstructive pulmonary disease) ([place])   . Tobacco use disorder, continuous   . STEMI (ST elevation myocardial infarction) ([place]) 04/29/2014    PTCA Lmain and CFX, in setting of VF/VT arrest and CGS    Past Surgical History  Procedure Laterality Date  . Coronary artery bypass graft  1992  . Lumbar laminectomy  [id number]     X2  . Prostatectomy  05/2008    Dr. Alinda Money and XRT  .  Appendectomy    . Tonsillectomy    . Left heart catheterization with coronary angiogram N/A 04/30/2014    Procedure: LEFT HEART CATHETERIZATION WITH CORONARY ANGIOGRAM;  Surgeon: Peter M Martinique, MD; Lmain 90% (s/p PTCA), LAD 80%, CFX 100% (s/p PTCA), RCA OK, LIMA-LAD atretic, SVG-OM 100% (chronic), EF 45%, pt req defib x 13 for VF/VT arrest, CHB w/ tem pacer, IABP and intubation  . Coronary artery bypass graft N/A 05/28/2014    Procedure: REDO  CORONARY ARTERY BYPASS GRAFTING (CABG);  Surgeon: Grace Isaac, MD;  Location: [place];  Service: Open Heart Surgery;  Laterality: N/A;  Times 4 using right internal mammary artery to Intermediate artery and endoscopically harvested left saphenous vein to LAD, OM1, and PD coronary arteries.  Darden Dates without cardioversion N/A 05/28/2014    Procedure: TRANSESOPHAGEAL ECHOCARDIOGRAM (TEE);  Surgeon: Grace Isaac, MD;  Location: [place];  Service: Open Heart Surgery;  Laterality: N/A;  . Tee without cardioversion N/A 06/24/2014    Procedure: TRANSESOPHAGEAL ECHOCARDIOGRAM (TEE);  Surgeon: Pixie Casino, MD;  Location: [facility] ENDOSCOPY;  Service: Cardiovascular;  Laterality: N/A;  . Cardioversion N/A 06/24/2014    Procedure: CARDIOVERSION;  Surgeon: Pixie Casino, MD;  Location: [facility] ENDOSCOPY;  Service: Cardiovascular;  Laterality: N/A;    Current Outpatient Prescriptions  Medication Sig Dispense Refill  . amiodarone (PACERONE) 200 MG tablet Take 1 tablet by mouth daily.    Marland Kitchen atorvastatin (LIPITOR) 40 MG tablet Take 1 tablet by mouth daily.    . fluticasone (FLONASE) 50 MCG/ACT nasal spray Place 1 spray into both nostrils daily.    . pantoprazole (PROTONIX) 40 MG tablet Take 40 mg by mouth daily.    . clopidogrel (PLAVIX) 75 MG tablet Take 75 mg by mouth daily.    Marland Kitchen ELIQUIS 5 MG TABS tablet Take 1 tablet by mouth 2 (two) times daily.    . folic acid (FOLVITE) 1 MG tablet Take 1 mg by mouth daily.    . furosemide (LASIX) 20 MG tablet Take 20 mg by mouth every other day.    Marland Kitchen KLOR-CON 8 MEQ tablet Take 1 tablet by mouth daily.    Marland Kitchen loratadine (CLARITIN) 10 MG tablet Take 10 mg by mouth daily.    . metoprolol (LOPRESSOR) 50 MG tablet Take 50 mg by mouth 2 (two) times daily.    . nitroGLYCERIN (NITROSTAT) 0.4 MG SL tablet Place 0.4 mg under the tongue as needed. Chest pain    . traMADol (ULTRAM) 50 MG tablet Take 50 mg by mouth every 6 (six) hours as needed. Pain    . zolpidem (AMBIEN) 5 MG tablet  Take 5 mg by mouth at bedtime.     No current facility-administered medications for this visit.    Allergies  Allergen Reactions  . Adhesive [Tape] Other (See Comments)    Takes skin off   . Codeine Sulfate Itching and Other (See Comments)    headaches    Social History   Social History  . Marital Status: Married    Spouse Name: N/A  . Number of Children: N/A  . Years of Education: N/A   Occupational History  . Retired-self employed    Social History Main Topics  . Smoking status: Current Every Day Smoker -- 40 years    Types: Cigarettes  . Smokeless tobacco: Not on file  . Alcohol Use: No  . Drug Use: No  . Sexual Activity: Yes   Other Topics Concern  . Not on file  Social History Narrative    Family History  Problem Relation Age of Onset  . Hypertension    . Cancer Mother     Colon  . Heart disease Father   . Coronary artery disease Father   . Cancer Sister   . Heart disease Brother     Review of Systems:  As stated in the HPI and otherwise negative.   BP 122/62 mmHg  Pulse 70  Ht 5\' 10"  (1.778 m)  Wt 179 lb 6.4 oz (81.375 kg)  BMI 25.74 kg/m2  SpO2 96%  Physical Examination: General: Well developed, well nourished, NAD HEENT: OP clear, mucus membranes moist SKIN: warm, dry. No rashes. Neuro: No focal deficits Musculoskeletal: Muscle strength 5/5 all ext Psychiatric: Mood and affect normal Neck: No JVD, no carotid bruits, no thyromegaly, no lymphadenopathy. Lungs:Clear bilaterally, no wheezes, rhonci, crackles Cardiovascular: Irreg irreg No murmurs, gallops or rubs. Abdomen:Soft. Bowel sounds present. Non-tender.  Extremities: No lower extremity edema. Pulses are 1-2 + in the bilateral DP/PT.  EKG:  EKG is not ordered today. The ekg ordered today demonstrates   Recent Labs: 05/29/2014: Magnesium 2.6* 10/26/2014: ALT 44; Hemoglobin 14.2; Platelets 229.0; TSH 1.98 02/08/2015: BUN 18; Creatinine, Ser 1.15; Potassium 4.4; Sodium 139   Lipid  Panel    Component Value Date/Time   CHOL 118 10/26/2014 1409   TRIG 109.0 10/26/2014 1409   TRIG 91 04/22/2006 0854   HDL 40.50 10/26/2014 1409   CHOLHDL 3 10/26/2014 1409   CHOLHDL 3.6 CALC 04/22/2006 0854   VLDL 21.8 10/26/2014 1409   LDLCALC 56 10/26/2014 1409     Wt Readings from Last 3 Encounters:  05/16/15 179 lb 6.4 oz (81.375 kg)  02/08/15 175 lb (79.379 kg)  11/04/14 167 lb 4 oz (75.864 kg)     Other studies Reviewed: Additional studies/ records that were reviewed today include: . Review of the above records demonstrates:    Assessment and Plan:   1. CAD: Stable post bypass. Continue statin and beta blocker.   2. PAD: Stable. ABI June 2016 stable. Repeat June 2017.   3. Atrial fibrillation/flutter: He appears to be in atrial fib today. HR is controlled. Continue amiodarone and Lopressor. He is being anti-coagulated with Eliquis. No bleeding issues. Cardioversion 06/24/14 but back in atrial fib. No plans to repeat cardioversion at this time as he is rate controlled and doing well.   4. Tobacco abuse: smoking cessation recommended.   5. Carotid artery disease: Moderate bilateral disease. Repeat dopplers June 2018.   Current medicines are reviewed at length with the patient today.  The patient does not have concerns regarding medicines.  The following changes have been made:  no change  Labs/ tests ordered today include:   No orders of the defined types were placed in this encounter.    Disposition:   FU with me in 6 months  Signed, Lauree Chandler, MD 05/16/2015 11:46 AM    [facility] [address], [place], [place]  [address] Phone: [phone]; Fax: [phone]

## 2015-05-16 NOTE — Patient Instructions (Signed)
Medication Instructions:  Your physician recommends that you continue on your current medications as directed. Please refer to the Current Medication list given to you today.   Labwork: none  Testing/Procedures: Your physician has requested that you have a carotid duplex. This test is an ultrasound of the carotid arteries in your neck. It looks at blood flow through these arteries that supply the brain with blood. Allow one hour for this exam. There are no restrictions or special instructions.  To be done in early July   Your physician has requested that you have an ankle brachial index (ABI). During this test an ultrasound and blood pressure cuff are used to evaluate the arteries that supply the arms and legs with blood. Allow thirty minutes for this exam. There are no restrictions or special instructions. To be done in July     Follow-Up: Your physician wants you to follow-up in: 6 months.  You will receive a reminder letter in the mail two months in advance. If you don't receive a letter, please call our office to schedule the follow-up appointment.   Any Other Special Instructions Will Be Listed Below (If Applicable).     If you need a refill on your cardiac medications before your next appointment, please call your pharmacy.

## 2015-06-07 IMAGING — CT CT HEAD W/O CM
1 series · 16 of 30 positions shown, 20 images · non-contrast
Comparison: Brain MRI 04/21/2009.

CLINICAL DATA: 73-year-old male who fell with posterior head
injury. Hematoma. Initial encounter.

EXAM:
CT HEAD WITHOUT CONTRAST
TECHNIQUE: Contiguous axial images were obtained from the base of the skull
through the vertex without intravenous contrast.

[Series 2: headseq 4.8 h45s · axial · 0.48mm/px · z∈[+1216,+1370]mm · 16 of 36 slices shown, 20 images]
[im 2/36  brain]
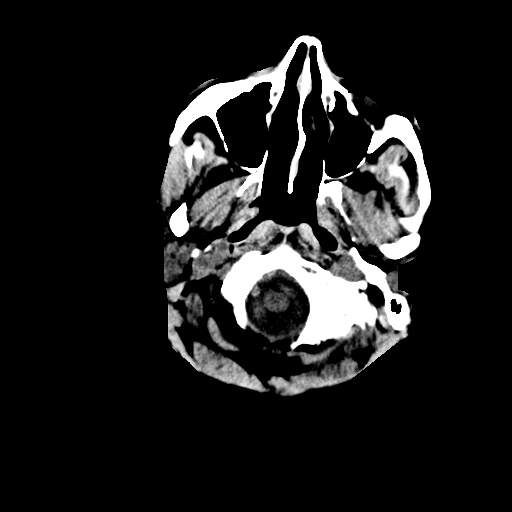
[im 2/36  bone]
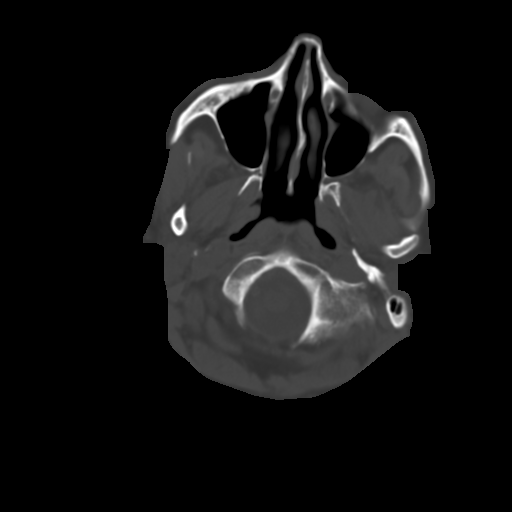
[im 4/36  brain]
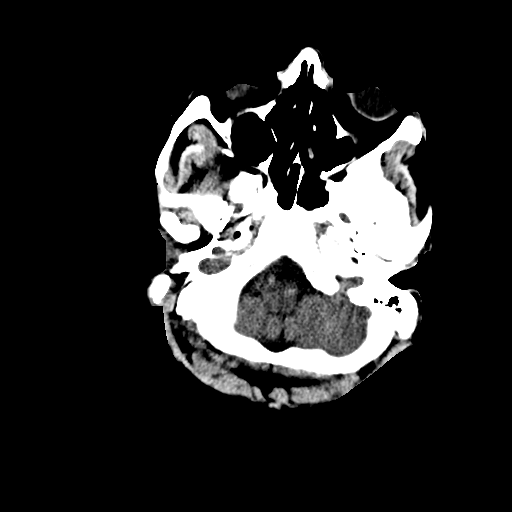
[im 7/36  brain]
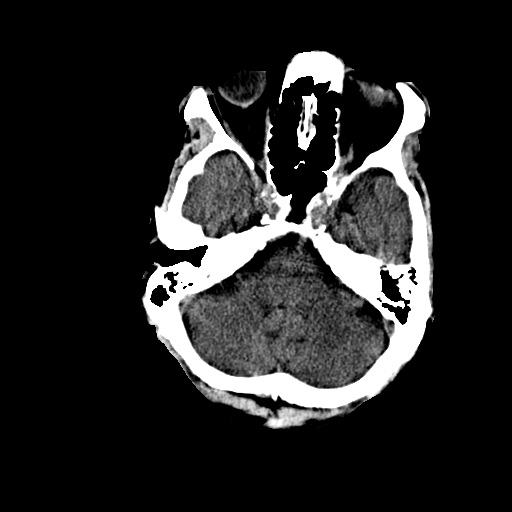
[im 9/36  brain]
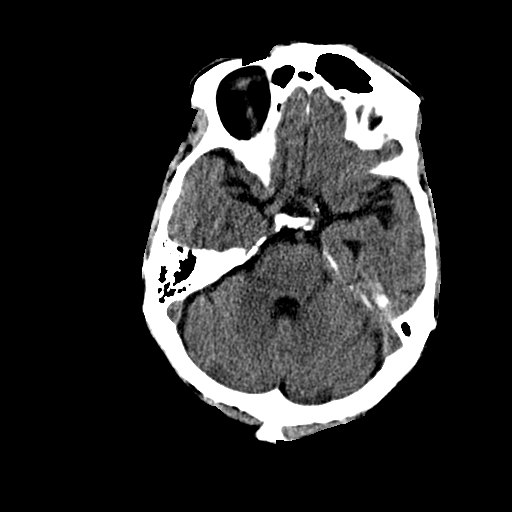
[im 10/36  brain]
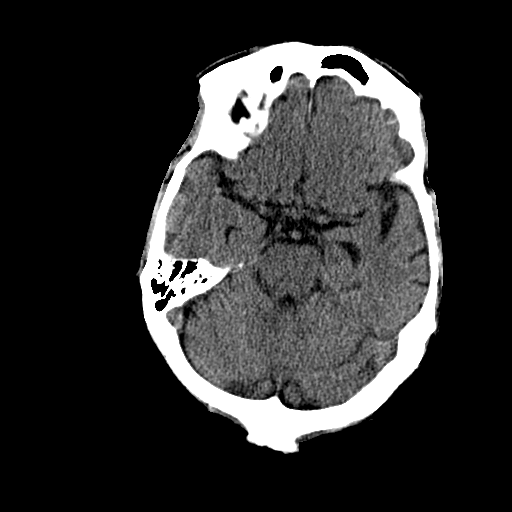
[im 10/36  bone]
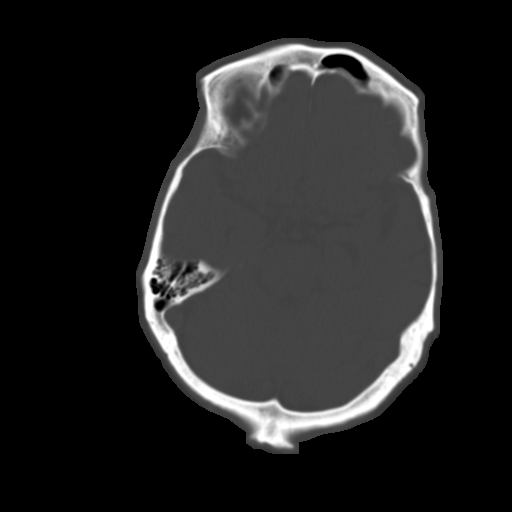
[im 13/36  brain]
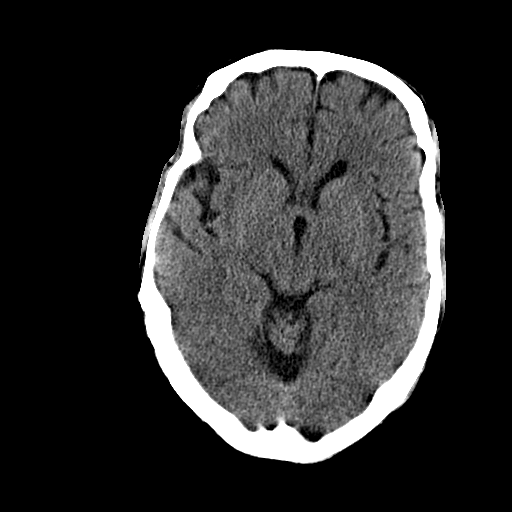
[im 15/36  brain]
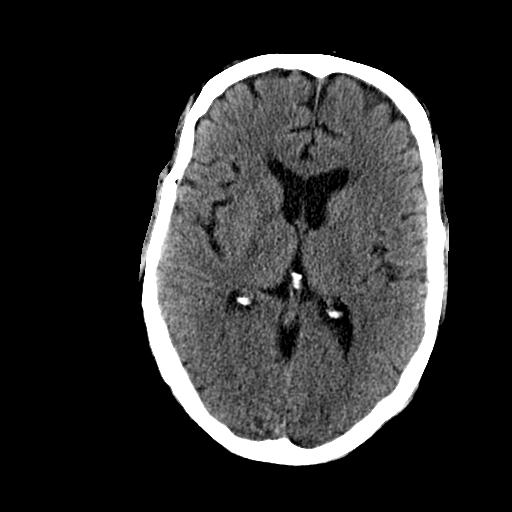
[im 17/36  brain]
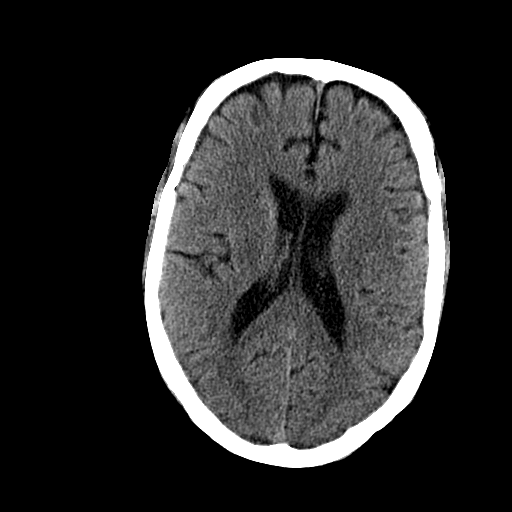
[im 19/36  brain]
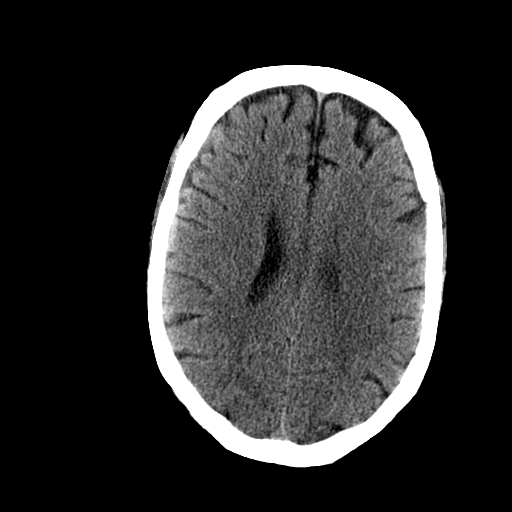
[im 19/36  bone]
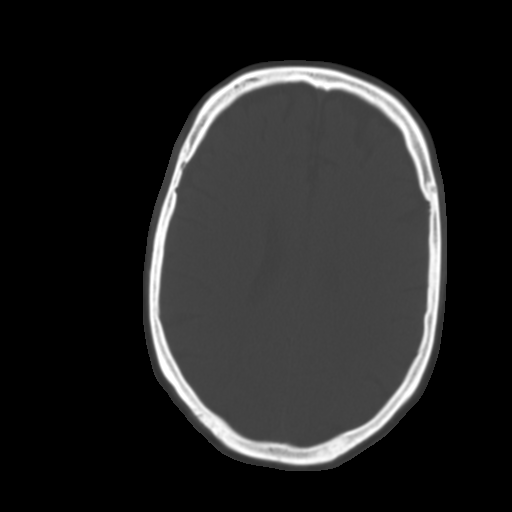
[im 21/36  brain]
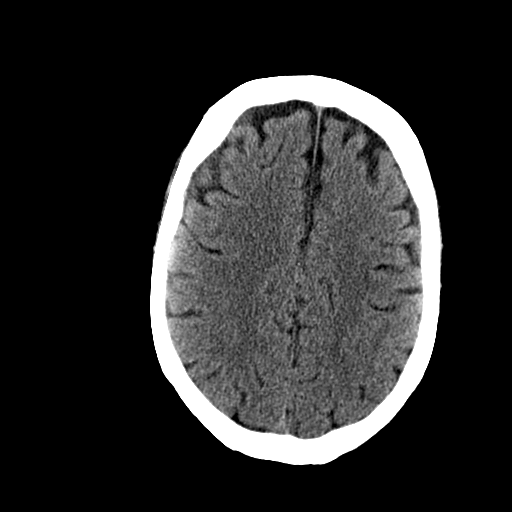
[im 23/36  brain]
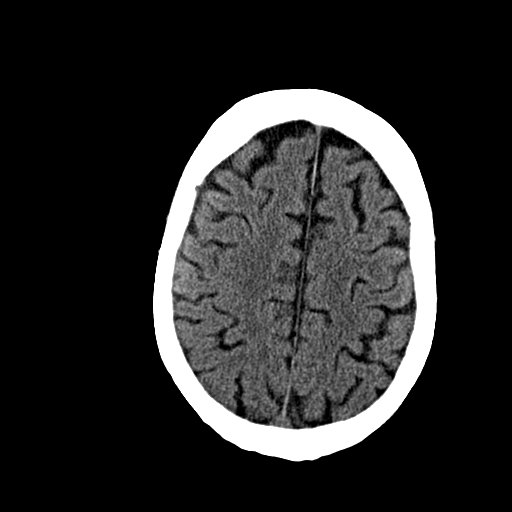
[im 26/36  brain]
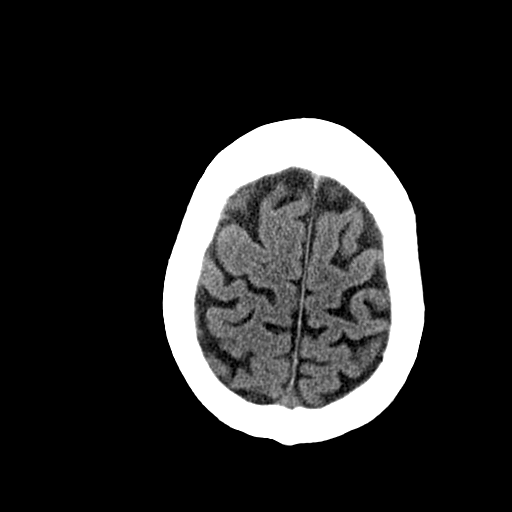
[im 27/36  brain]
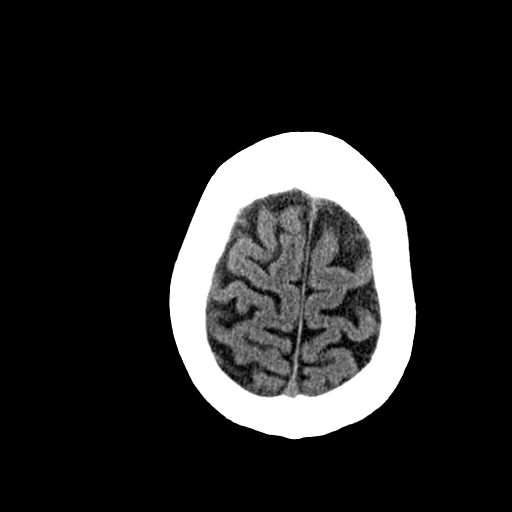
[im 27/36  bone]
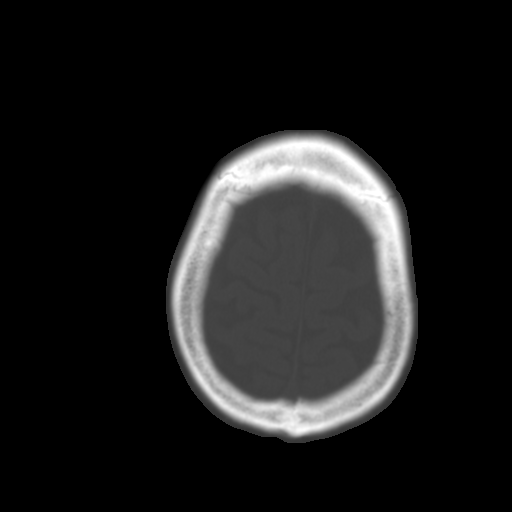
[im 29/36  brain]
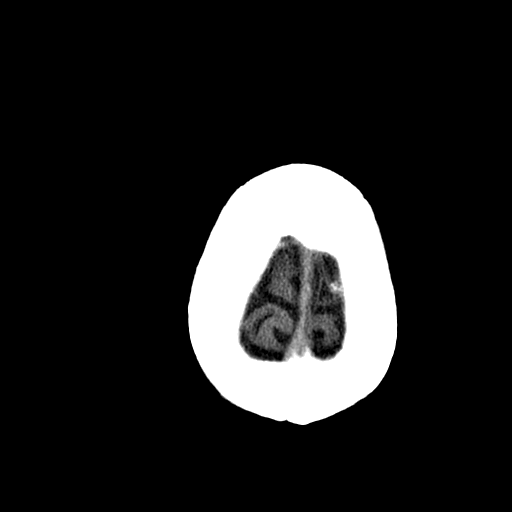
[im 32/36  brain]
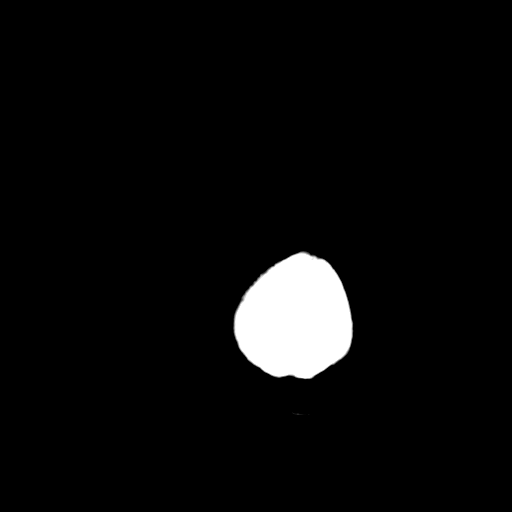
[im 34/36  brain]
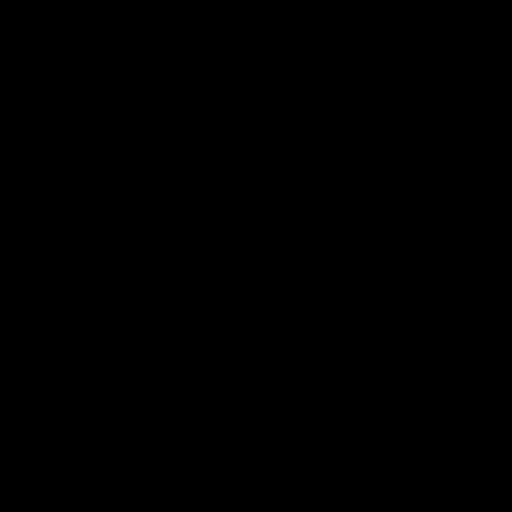

[16 of 30 positions shown; findings below may reference images not displayed]

FINDINGS: No focal scalp hematoma identified. No acute orbits soft tissue
finding. Mild ethmoid sinus mucosal thickening. Other Visualized
paranasal sinuses and mastoids are clear. Calvarium intact.

Calcified atherosclerosis at the skull base. Cerebral volume is
stable and within normal limits for age. No midline shift,
ventriculomegaly, mass effect, evidence of mass lesion, intracranial
hemorrhage or evidence of cortically based acute infarction.
Gray-white matter differentiation is within normal limits throughout
the brain. No suspicious intracranial vascular hyperdensity.
IMPRESSION: Normal for age non contrast CT appearance of the brain. No acute
traumatic injury identified.

## 2015-06-08 ENCOUNTER — Ambulatory Visit (INDEPENDENT_AMBULATORY_CARE_PROVIDER_SITE_OTHER): Payer: Medicare Other | Admitting: Internal Medicine

## 2015-06-08 ENCOUNTER — Ambulatory Visit (INDEPENDENT_AMBULATORY_CARE_PROVIDER_SITE_OTHER)
Admission: RE | Admit: 2015-06-08 | Discharge: 2015-06-08 | Disposition: A | Discharge status: Home / Self Care | Payer: Medicare Other | Source: Ambulatory Visit | Attending: Internal Medicine | Admitting: Internal Medicine

## 2015-06-08 ENCOUNTER — Encounter: Payer: Self-pay | Admitting: Internal Medicine

## 2015-06-08 VITALS — BP 120/78 | HR 79 | Wt 176.0 lb

## 2015-06-08 DIAGNOSIS — R0602 Shortness of breath: Secondary | ICD-10-CM | POA: Diagnosis not present

## 2015-06-08 DIAGNOSIS — R05 Cough: Secondary | ICD-10-CM

## 2015-06-08 DIAGNOSIS — M5440 Lumbago with sciatica, unspecified side: Secondary | ICD-10-CM | POA: Diagnosis not present

## 2015-06-08 DIAGNOSIS — J441 Chronic obstructive pulmonary disease with (acute) exacerbation: Secondary | ICD-10-CM | POA: Diagnosis not present

## 2015-06-08 DIAGNOSIS — I2510 Atherosclerotic heart disease of native coronary artery without angina pectoris: Secondary | ICD-10-CM | POA: Diagnosis not present

## 2015-06-08 DIAGNOSIS — J209 Acute bronchitis, unspecified: Secondary | ICD-10-CM

## 2015-06-08 DIAGNOSIS — R059 Cough, unspecified: Secondary | ICD-10-CM

## 2015-06-08 DIAGNOSIS — G8929 Other chronic pain: Secondary | ICD-10-CM

## 2015-06-08 MED ORDER — LEVOFLOXACIN 500 MG PO TABS: 500.0000 mg | ORAL_TABLET | Freq: Every day | ORAL | Status: DC

## 2015-06-08 MED ORDER — HALOBETASOL PROPIONATE 0.05 % EX OINT: TOPICAL_OINTMENT | Freq: Two times a day (BID) | CUTANEOUS | Status: DC

## 2015-06-08 MED ORDER — FLUTICASONE FUROATE-VILANTEROL 100-25 MCG/INH IN AEPB: 1.0000 | INHALATION_SPRAY | Freq: Every day | RESPIRATORY_TRACT | Status: DC

## 2015-06-08 MED ORDER — METHYLPREDNISOLONE ACETATE 80 MG/ML IJ SUSP
80.0000 mg | Freq: Once | INTRAMUSCULAR | Status: AC
  Administered 2015-06-08: 80 mg via INTRAMUSCULAR

## 2015-06-08 NOTE — Addendum Note (Signed)
Addended by: Cresenciano Lick on: 06/08/2015 02:24 PM   Modules accepted: Orders

## 2015-06-08 NOTE — Assessment & Plan Note (Signed)
CXR Depomedrol 80 mg im Breo qd Levaquin x10 d

## 2015-06-08 NOTE — Assessment & Plan Note (Signed)
Using a walker Tramadol prn

## 2015-06-08 NOTE — Progress Notes (Signed)
Pre visit review using our clinic review tool, if applicable. No additional management support is needed unless otherwise documented below in the visit note. 

## 2015-06-08 NOTE — Progress Notes (Signed)
Subjective:  Patient ID: [name], male    DOB: [birth year]  Age: 75 y.o. MRN: [id number]  CC: No chief complaint on file.   HPI [name] presents for a URI -  Green and bloody sputum, wheezing, SOB, fatigue F/u COPD, HTN., LBP, PVD  Outpatient Prescriptions Prior to Visit  Medication Sig Dispense Refill  . amiodarone (PACERONE) 200 MG tablet Take 1 tablet by mouth daily.    Marland Kitchen atorvastatin (LIPITOR) 40 MG tablet Take 1 tablet by mouth daily.    . clopidogrel (PLAVIX) 75 MG tablet Take 75 mg by mouth daily.    Marland Kitchen ELIQUIS 5 MG TABS tablet Take 1 tablet by mouth 2 (two) times daily.    . fluticasone (FLONASE) 50 MCG/ACT nasal spray Place 1 spray into both nostrils daily.    . folic acid (FOLVITE) 1 MG tablet Take 1 mg by mouth daily.    . furosemide (LASIX) 20 MG tablet Take 20 mg by mouth every other day.    Marland Kitchen KLOR-CON 8 MEQ tablet Take 1 tablet by mouth daily.    Marland Kitchen loratadine (CLARITIN) 10 MG tablet Take 10 mg by mouth daily.    . metoprolol (LOPRESSOR) 50 MG tablet Take 50 mg by mouth 2 (two) times daily.    . nitroGLYCERIN (NITROSTAT) 0.4 MG SL tablet Place 0.4 mg under the tongue as needed. Chest pain    . pantoprazole (PROTONIX) 40 MG tablet Take 40 mg by mouth daily.    . traMADol (ULTRAM) 50 MG tablet Take 50 mg by mouth every 6 (six) hours as needed. Pain    . zolpidem (AMBIEN) 5 MG tablet Take 5 mg by mouth at bedtime.     No facility-administered medications prior to visit.    ROS Review of Systems  Constitutional: Positive for fatigue. Negative for appetite change and unexpected weight change.  HENT: Positive for congestion, nosebleeds, postnasal drip, rhinorrhea, sore throat and voice change. Negative for sneezing and trouble swallowing.   Eyes: Negative for itching and visual disturbance.  Respiratory: Positive for cough, chest tightness, shortness of breath and wheezing.   Cardiovascular: Negative for chest pain, palpitations and leg swelling.    Gastrointestinal: Negative for nausea, diarrhea, blood in stool and abdominal distention.  Genitourinary: Negative for frequency and hematuria.  Musculoskeletal: Positive for back pain, gait problem and neck stiffness. Negative for joint swelling and neck pain.  Skin: Negative for rash.  Neurological: Negative for dizziness, tremors, speech difficulty and weakness.  Psychiatric/Behavioral: Negative for sleep disturbance, dysphoric mood and agitation. The patient is not nervous/anxious.   walker eryth throat decr BS B  I personally provided Breo inhaler use teaching. After the teaching patient was able to demonstrate it's use effectively. All questions were answered  Objective:  BP 120/78 mmHg  Pulse 79  Wt 176 lb (79.833 kg)  SpO2 95%  BP Readings from Last 3 Encounters:  06/08/15 120/78  05/16/15 122/62  02/08/15 124/72    Wt Readings from Last 3 Encounters:  06/08/15 176 lb (79.833 kg)  05/16/15 179 lb 6.4 oz (81.375 kg)  02/08/15 175 lb (79.379 kg)    Physical Exam  Constitutional: He is oriented to person, place, and time. He appears well-developed. No distress.  NAD  HENT:  Mouth/Throat: Oropharynx is clear and moist.  Eyes: Conjunctivae are normal. Pupils are equal, round, and reactive to light.  Neck: Normal range of motion. No JVD present. No thyromegaly present.  Cardiovascular: Normal rate, regular rhythm, normal  heart sounds and intact distal pulses.  Exam reveals no gallop and no friction rub.   No murmur heard. Pulmonary/Chest: Effort normal. No respiratory distress. He has no wheezes. He has rales. He exhibits no tenderness.  Abdominal: Soft. Bowel sounds are normal. He exhibits no distension and no mass. There is no tenderness. There is no rebound and no guarding.  Musculoskeletal: Normal range of motion. He exhibits tenderness. He exhibits no edema.  Lymphadenopathy:    He has no cervical adenopathy.  Neurological: He is alert and oriented to person,  place, and time. He has normal reflexes. No cranial nerve deficit. He exhibits normal muscle tone. He displays a negative Romberg sign. Coordination abnormal. Gait normal.  Skin: Skin is warm and dry. Rash noted.  Psychiatric: He has a normal mood and affect. His behavior is normal. Judgment and thought content normal.  LS tender Using a walker LLL rales Palms and soles w/dry patches  Lab Results  Component Value Date   WBC 7.6 10/26/2014   HGB 14.2 10/26/2014   HCT 42.2 10/26/2014   PLT 229.0 10/26/2014   GLUCOSE 64* 02/08/2015   CHOL 118 10/26/2014   TRIG 109.0 10/26/2014   HDL 40.50 10/26/2014   LDLCALC 56 10/26/2014   ALT 44 10/26/2014   AST 28 10/26/2014   NA 139 02/08/2015   K 4.4 02/08/2015   CL 101 02/08/2015   CREATININE 1.15 02/08/2015   BUN 18 02/08/2015   CO2 31 02/08/2015   TSH 1.98 10/26/2014   PSA 0.01* 02/20/2012   INR 1.30 05/28/2014   HGBA1C 5.7 07/11/2006    No results found.  Assessment & Plan:   Diagnoses and all orders for this visit:  Cough -     DG Chest 2 View  Other orders -     halobetasol (ULTRAVATE) 0.05 % ointment; Apply topically 2 (two) times daily. -     levofloxacin (LEVAQUIN) 500 MG tablet; Take 1 tablet (500 mg total) by mouth daily. -     Discontinue: fluticasone furoate-vilanterol (BREO ELLIPTA) 100-25 MCG/INH AEPB; Inhale 1 puff into the lungs daily. -     fluticasone furoate-vilanterol (BREO ELLIPTA) 100-25 MCG/INH AEPB; Inhale 1 puff into the lungs daily.  I have discontinued [name]'s guaiFENesin-dextromethorphan. I am also having him start on halobetasol and levofloxacin. Additionally, I am having him maintain his metoprolol, nitroGLYCERIN, loratadine, traMADol, furosemide, clopidogrel, folic acid, amiodarone, ELIQUIS, atorvastatin, fluticasone, pantoprazole, KLOR-CON, zolpidem, and fluticasone furoate-vilanterol.  Meds ordered this encounter  Medications  . DISCONTD: guaiFENesin-dextromethorphan (ROBITUSSIN DM)  100-10 MG/5ML syrup    Sig: Take 5 mLs by mouth every 4 (four) hours as needed for cough.  . halobetasol (ULTRAVATE) 0.05 % ointment    Sig: Apply topically 2 (two) times daily.    Dispense:  100 g    Refill:  3  . levofloxacin (LEVAQUIN) 500 MG tablet    Sig: Take 1 tablet (500 mg total) by mouth daily.    Dispense:  10 tablet    Refill:  0  . DISCONTD: fluticasone furoate-vilanterol (BREO ELLIPTA) 100-25 MCG/INH AEPB    Sig: Inhale 1 puff into the lungs daily.    Dispense:  1 each    Refill:  5  . fluticasone furoate-vilanterol (BREO ELLIPTA) 100-25 MCG/INH AEPB    Sig: Inhale 1 puff into the lungs daily.    Dispense:  1 each    Refill:  5     Follow-up: Return in about 3 months (around 09/08/2015) for a  follow-up visit.  Walker Kehr, MD

## 2015-06-11 IMAGING — CR DG CHEST 2V
2 series · 2 of 2 positions shown · non-contrast
Comparison: 05/31/2014

CLINICAL DATA: Vomiting and left arm pain since 4900 hours today.
CABG performed 05/28/2014.

EXAM:
CHEST  2 VIEW

[chest pa]
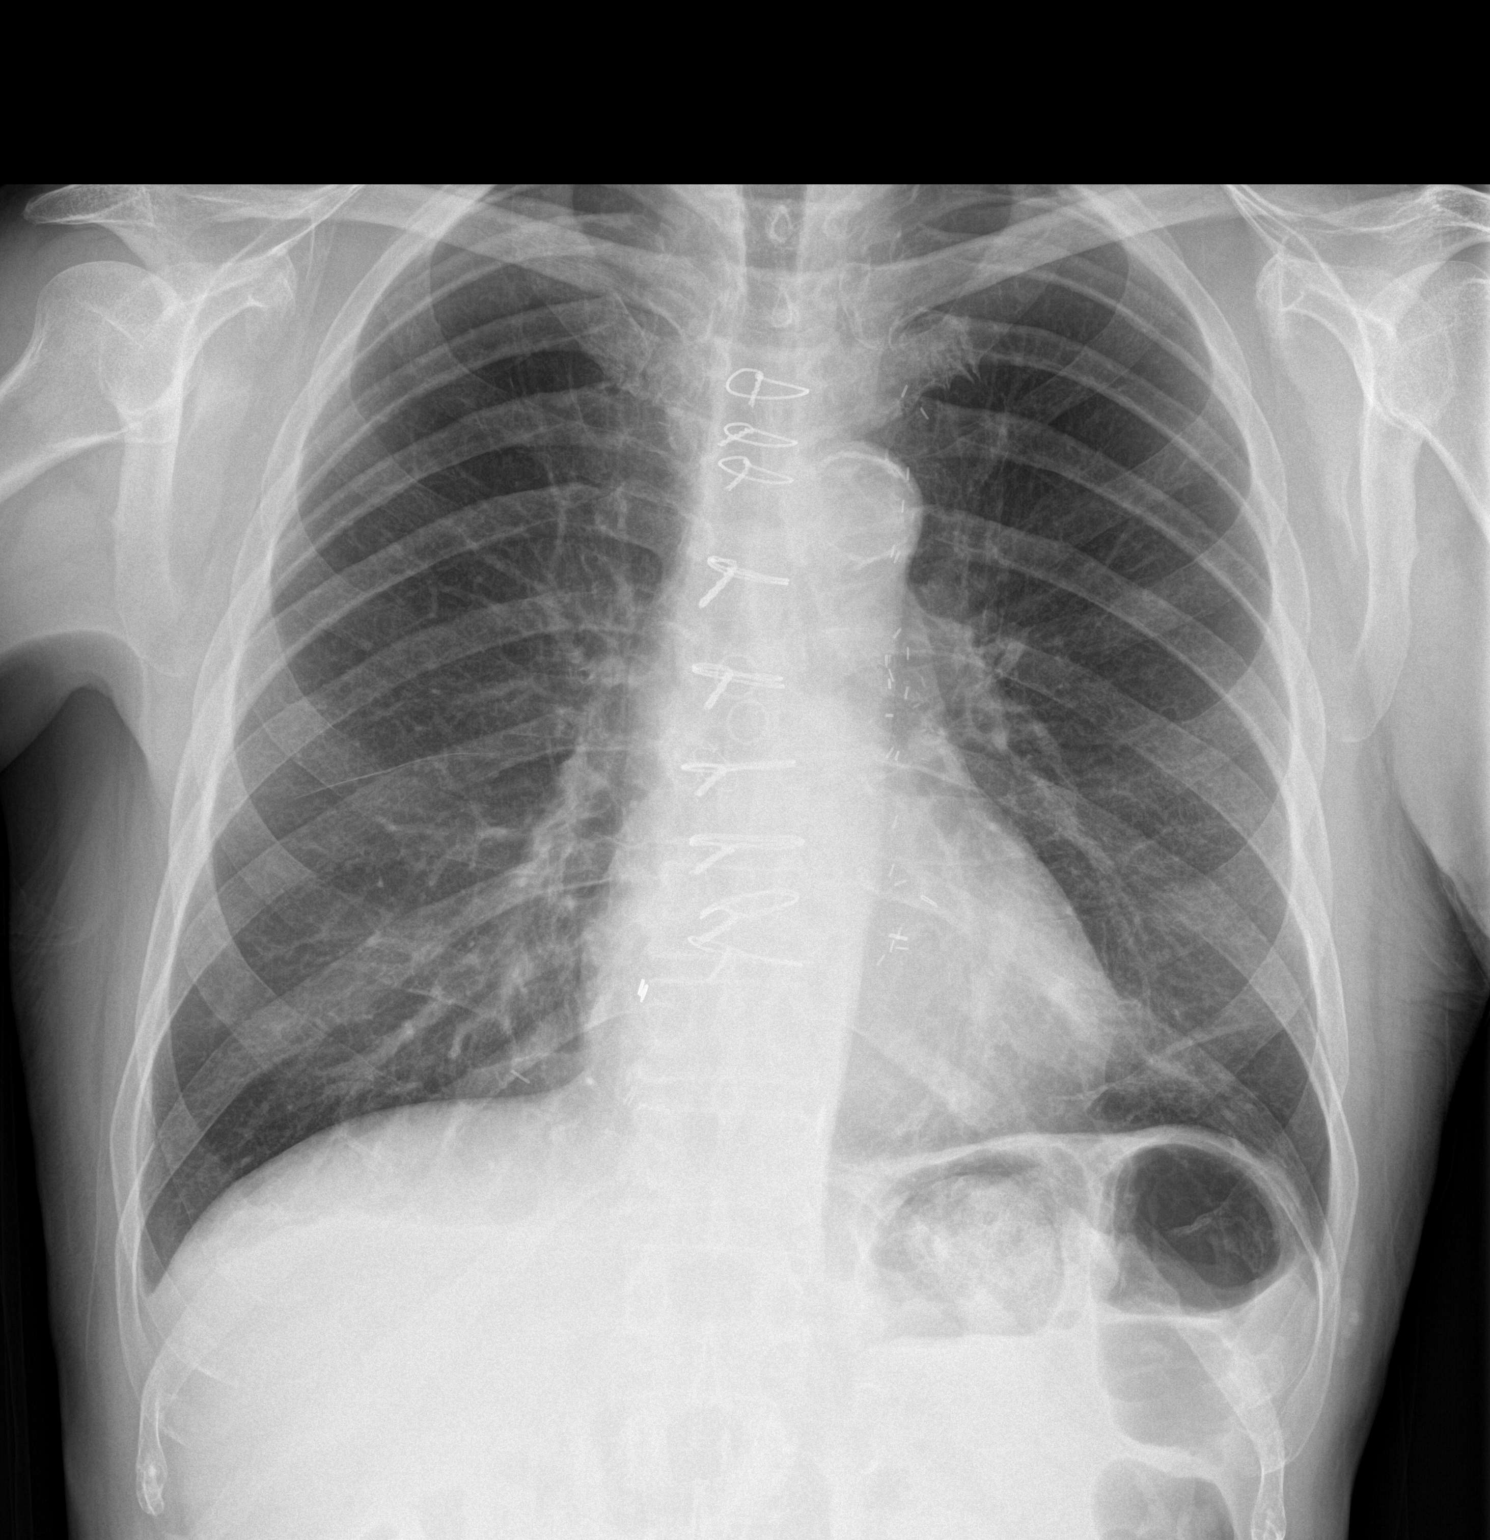

[chest lat]
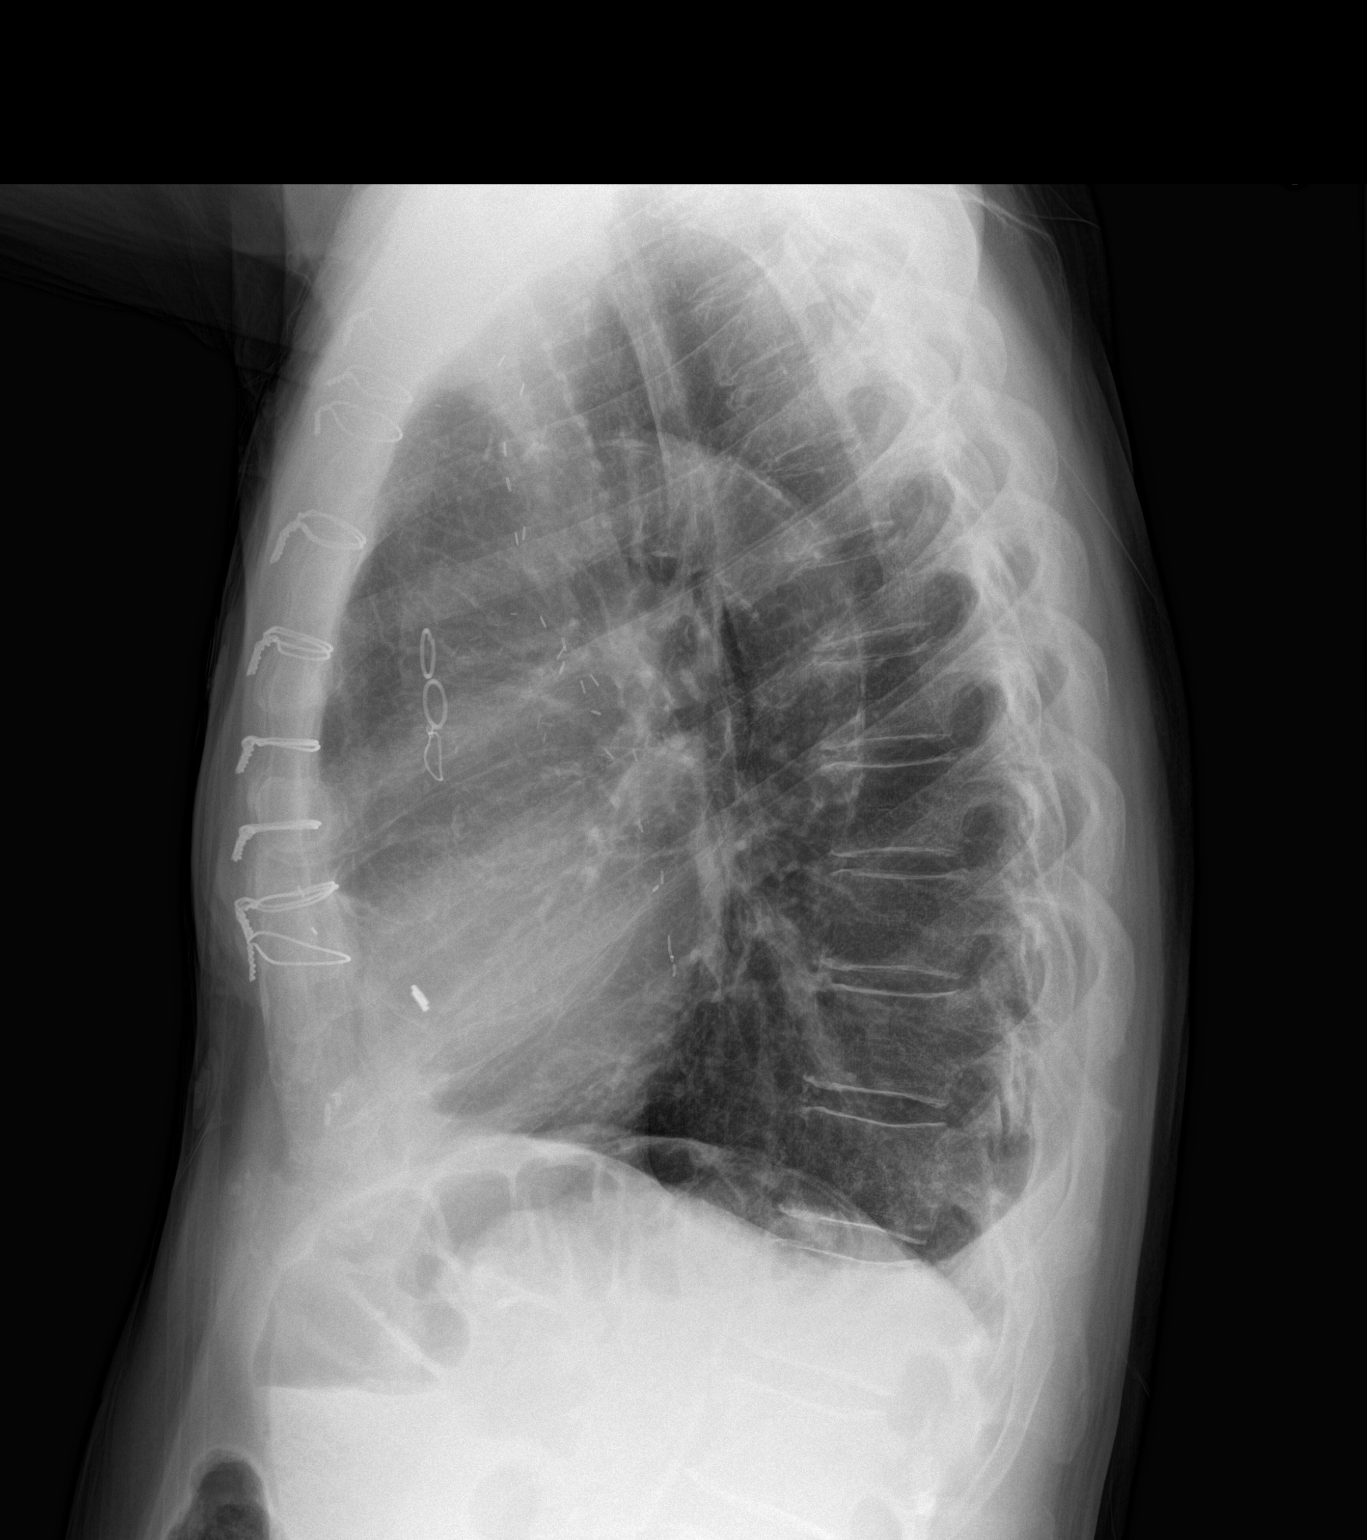

[2 of 2 positions shown; findings below may reference images not displayed]

FINDINGS: Status post median sternotomy and CABG. Heart is normal in size.
Aeration at the lung bases has improved. There is minimal persistent
left lower lobe atelectasis or scarring. Persistent small
right-sided pleural effusion. There has been some improvement in the
small left pleural effusion. No pneumothorax or pulmonary edema.

No free intraperitoneal air.
IMPRESSION: 1. Persistent minimal left lower lobe atelectasis.
2. Interval improvement and bibasilar aeration.
3. Persistent small right-sided pleural effusion.

## 2015-06-15 ENCOUNTER — Other Ambulatory Visit: Payer: Self-pay | Admitting: Internal Medicine

## 2015-06-22 ENCOUNTER — Telehealth: Payer: Self-pay | Admitting: Internal Medicine

## 2015-06-22 NOTE — Telephone Encounter (Signed)
Pt has a large knot on his elbow.  He said that he would like a nurse to give him a call.  We do not have any appt today and he wanted to be seen

## 2015-06-22 NOTE — Telephone Encounter (Signed)
11:45 on 3/16 Thx

## 2015-06-22 NOTE — Telephone Encounter (Signed)
Ok to work in or see another MD on a different day?

## 2015-06-23 NOTE — Telephone Encounter (Signed)
Ok Thx 

## 2015-06-23 NOTE — Telephone Encounter (Signed)
I advised OV today. He can not come today.  Pt states he has no known injury. He can't tell if the cyst is fluid filled. It is causing minor pain in his right forearm and slight shaking in his right arm. OV scheduled 06/24/15 @ 11:30.

## 2015-06-24 ENCOUNTER — Ambulatory Visit (INDEPENDENT_AMBULATORY_CARE_PROVIDER_SITE_OTHER): Payer: Medicare Other | Admitting: Internal Medicine

## 2015-06-24 ENCOUNTER — Encounter: Payer: Self-pay | Admitting: Internal Medicine

## 2015-06-24 VITALS — BP 124/80 | HR 64 | Temp 98.4°F | Wt 171.0 lb

## 2015-06-24 DIAGNOSIS — M7031 Other bursitis of elbow, right elbow: Secondary | ICD-10-CM

## 2015-06-24 DIAGNOSIS — I2510 Atherosclerotic heart disease of native coronary artery without angina pectoris: Secondary | ICD-10-CM

## 2015-06-24 NOTE — Patient Instructions (Addendum)
Postprocedure instructions :    A Band-Aid should be left on for 12 hours. Injection therapy is not a cure itself. It is used in conjunction with other modalities. You can use nonsteroidal anti-inflammatories like ibuprofen , hot and cold compresses. Rest is recommended in the next 24 hours. You need to report immediately  if fever, chills or any signs of infection develop.    Elbow Bursitis Elbow bursitis is inflammation of the fluid-filled sac (bursa) between the tip of your elbow bone (olecranon) and your skin. Elbow bursitis may also be called olecranon bursitis. Normally, the olecranon bursa has only a small amount of fluid in it to cushion and protect your elbow bone. Elbow bursitis causes fluid to build up inside the bursa. Over time, this swelling and inflammation can cause pain when you bend or lean on your elbow.  CAUSES Elbow bursitis may be caused by:   Elbow injury (acute trauma).  Leaning on hard surfaces for long periods of time.  Infection from an injury that breaks the skin near your elbow.  A bone growth (spur) that forms at the tip of your elbow.  A medical condition that causes inflammation in your body, such as gout or rheumatoid arthritis.  The cause may also be unknown.  SIGNS AND SYMPTOMS  The first sign of elbow bursitis is usually swelling over the tip of your elbow. This can grow to be the size of a golf ball. This may start suddenly or develop gradually. You may also have:  Pain when bending or leaning on your elbow.  Restricted movement of your elbow.  If your bursitis is caused by an infection, symptoms may also include:  Redness, warmth, and tenderness of the elbow.  Drainage of pus from the swollen area over your elbow, if the skin breaks open. DIAGNOSIS  Your health care provider may be able to diagnose elbow bursitis based on your signs and symptoms, especially if you have recently been injured. Your health care provider will also do a  physical exam. This may include:  X-rays to look for a bone spur or a bone fracture.  Draining fluid from the bursa to test it for infection.  Blood tests to rule out gout or rheumatoid arthritis. TREATMENT  Treatment for elbow bursitis depends on the cause. Treatment may include:  Medicines. These may include:  Over-the-counter medicines to relieve pain and inflammation.  Antibiotic medicines to fight infection.  Injections of anti-inflammatory medicines (steroids).  Wrapping your elbow with a bandage.  Draining fluid from the bursa.  Wearing elbow pads.  If your bursitis does not get better with treatment, surgery may be needed to remove the bursa.  HOME CARE INSTRUCTIONS   Take medicines only as directed by your health care provider.  If you were prescribed an antibiotic medicine, finish all of it even if you start to feel better.  If your bursitis is caused by an injury, rest your elbow and wear your bandage as directed by your health care provider. You may alsoapply ice to the injured area as directed by your health care provider:  Put ice in a plastic bag.  Place a towel between your skin and the bag.  Leave the ice on for 20 minutes, 2-3 times per day.  Avoid any activities that cause elbow pain.  Use elbow pads or elbow wraps to cushion your elbow. SEEK MEDICAL CARE IF:  You have a fever.   Your symptoms do not get better with treatment.  Your pain or swelling gets worse.  Your elbow pain or swelling goes away and then returns.  You have drainage of pus from the swollen area over your elbow.   This information is not intended to replace advice given to you by your health care provider. Make sure you discuss any questions you have with your health care provider.   Document Released: 04/25/2006 Document Revised: 04/16/2014 Document Reviewed: 12/02/2013 Elsevier Interactive Patient Education [employer].

## 2015-06-24 NOTE — Progress Notes (Signed)
Subjective:  Patient ID: [name], male    DOB: 1941-01-26  Age: 75 y.o. MRN: [id number]  CC: No chief complaint on file.   HPI [name] presents for R elbow swelling, NT  X 1.5 wks  Outpatient Prescriptions Prior to Visit  Medication Sig Dispense Refill  . amiodarone (PACERONE) 200 MG tablet Take 1 tablet by mouth daily.    Marland Kitchen atorvastatin (LIPITOR) 40 MG tablet Take 1 tablet by mouth daily.    . clopidogrel (PLAVIX) 75 MG tablet Take 75 mg by mouth daily.    Marland Kitchen ELIQUIS 5 MG TABS tablet Take 1 tablet by mouth 2 (two) times daily.    . fluticasone (FLONASE) 50 MCG/ACT nasal spray Place 1 spray into both nostrils daily.    . fluticasone furoate-vilanterol (BREO ELLIPTA) 100-25 MCG/INH AEPB Inhale 1 puff into the lungs daily. 1 each 5  . folic acid (FOLVITE) 1 MG tablet Take 1 mg by mouth daily.    . furosemide (LASIX) 20 MG tablet Take 20 mg by mouth every other day.    . halobetasol (ULTRAVATE) 0.05 % ointment Apply topically 2 (two) times daily. 100 g 3  . KLOR-CON 8 MEQ tablet TAKE 1 TABLET DAILY 90 tablet 3  . loratadine (CLARITIN) 10 MG tablet Take 10 mg by mouth daily.    . metoprolol (LOPRESSOR) 50 MG tablet Take 50 mg by mouth 2 (two) times daily.    . nitroGLYCERIN (NITROSTAT) 0.4 MG SL tablet Place 0.4 mg under the tongue as needed. Chest pain    . pantoprazole (PROTONIX) 40 MG tablet Take 40 mg by mouth daily.    . traMADol (ULTRAM) 50 MG tablet Take 50 mg by mouth every 6 (six) hours as needed. Pain    . zolpidem (AMBIEN) 5 MG tablet Take 5 mg by mouth at bedtime.    Marland Kitchen levofloxacin (LEVAQUIN) 500 MG tablet Take 1 tablet (500 mg total) by mouth daily. (Patient not taking: Reported on 06/24/2015) 10 tablet 0   No facility-administered medications prior to visit.    ROS Review of Systems  Constitutional: Negative for fever.  Musculoskeletal: Positive for back pain, arthralgias and gait problem. Negative for neck pain.  Skin: Negative for color  change, rash and wound.  Neurological: Negative for weakness.  Psychiatric/Behavioral: Negative for sleep disturbance.    Objective:  BP 124/80 mmHg  Pulse 64  Temp(Src) 98.4 F (36.9 C) (Oral)  Wt 171 lb (77.565 kg)  SpO2 97%  BP Readings from Last 3 Encounters:  06/24/15 124/80  06/08/15 120/78  05/16/15 122/62    Wt Readings from Last 3 Encounters:  06/24/15 171 lb (77.565 kg)  06/08/15 176 lb (79.833 kg)  05/16/15 179 lb 6.4 oz (81.375 kg)    Physical Exam  Constitutional: No distress.  Musculoskeletal: He exhibits edema.  Skin: No rash noted. No erythema. No pallor.  large swelling - posterior R elbow  Lab Results  Component Value Date   WBC 7.6 10/26/2014   HGB 14.2 10/26/2014   HCT 42.2 10/26/2014   PLT 229.0 10/26/2014   GLUCOSE 64* 02/08/2015   CHOL 118 10/26/2014   TRIG 109.0 10/26/2014   HDL 40.50 10/26/2014   LDLCALC 56 10/26/2014   ALT 44 10/26/2014   AST 28 10/26/2014   NA 139 02/08/2015   K 4.4 02/08/2015   CL 101 02/08/2015   CREATININE 1.15 02/08/2015   BUN 18 02/08/2015   CO2 31 02/08/2015   TSH 1.98 10/26/2014  PSA 0.01* 02/20/2012   INR 1.30 05/28/2014   HGBA1C 5.7 07/11/2006    Dg Chest 2 View  06/08/2015  CLINICAL DATA:  Cough and shortness of breath for the past week; history of pneumonia, coronary artery disease status post MI and CABG and stent placement, current smoker. EXAM: CHEST  2 VIEW COMPARISON:  PA and lateral chest x-ray of July 08, 2014. FINDINGS: The lungs remain mildly hyperinflated. There is no focal infiltrate. There is no pleural effusion. The heart is normal in size. There are post CABG changes. The pulmonary vascularity is normal. The mediastinum is normal in width. The trachea is midline. The bony thorax exhibits no acute abnormality. IMPRESSION: COPD, post CABG changes. There is no evidence of pneumonia, CHF, nor other acute cardiopulmonary abnormality. Electronically Signed   By: David  Martinique M.D.   On: 06/08/2015  14:18   Options to treat were discussed. She elected aspiration/steroids   Procedure Note :    Procedure : Elbow  bursa aspiration and steroid injection   Indication: Bursitis R elbow.   Risks including unsuccessful procedure , bleeding, infection, bruising, skin atrophy and others were explained to the patient in detail as well as the benefits. Informed consent was obtained and signed.   Tthe patient was placed in a comfortable position. Skin was prepped with Betadine and alcohol  and anesthetized with a cooling spray. Then, a 3 cc syringe with a 1 inch long 25-gauge needle was used for a skin over bursa injection 1 mL of 2% lidocaine. 22 gauge 1 inch needle on a 10 ml syringe was used to aspirate 12 ml of reddish sero-sanguinous fluid (discarded). Then, 20 mg of Depo-Medrol and 1/2 cc Lido was injected in the bursa .  Band-Aid was applied. Coban wrap.   Tolerated well. Complications: None. Good pain relief following the procedure.   Postprocedure instructions :    A Band-Aid should be left on for 12 hours. Injection therapy is not a cure itself. It is used in conjunction with other modalities. You can use nonsteroidal anti-inflammatories like ibuprofen , hot and cold compresses. Rest is recommended in the next 24 hours. You need to report immediately  if fever, chills or any signs of infection develop.   Assessment & Plan:   Diagnoses and all orders for this visit:  Other bursitis of elbow, right elbow -     methylPREDNISolone acetate (DEPO-MEDROL) injection 20 mg; Inject 0.5 mLs (20 mg total) into the articular space once.  I have discontinued Mr. Majewski's levofloxacin. I am also having him maintain his metoprolol, nitroGLYCERIN, loratadine, traMADol, furosemide, clopidogrel, folic acid, amiodarone, ELIQUIS, atorvastatin, fluticasone, pantoprazole, zolpidem, halobetasol, fluticasone furoate-vilanterol, and KLOR-CON. We will continue to administer methylPREDNISolone acetate.  Meds  ordered this encounter  Medications  . methylPREDNISolone acetate (DEPO-MEDROL) injection 20 mg    Sig:      Follow-up: No Follow-up on file.  Walker Kehr, MD

## 2015-06-24 NOTE — Progress Notes (Signed)
Pre visit review using our clinic review tool, if applicable. No additional management support is needed unless otherwise documented below in the visit note. 

## 2015-06-25 DIAGNOSIS — M7031 Other bursitis of elbow, right elbow: Secondary | ICD-10-CM | POA: Insufficient documentation

## 2015-06-25 MED ORDER — METHYLPREDNISOLONE ACETATE 40 MG/ML IJ SUSP: 20.0000 mg | Freq: Once | INTRAMUSCULAR | Status: DC

## 2015-06-25 NOTE — Assessment & Plan Note (Signed)
Discussed options to treat Pt elected aspiration

## 2015-06-27 IMAGING — CR DG CHEST 2V
2 series · 2 of 2 positions shown · non-contrast
Comparison: PA and lateral chest of June 22, 2014

CLINICAL DATA: CABG all May 28, 2014 ; recent cardioversion ;
chest pain and shortness of breath; current tobacco use

EXAM:
CHEST  2 VIEW

[w chest pa]
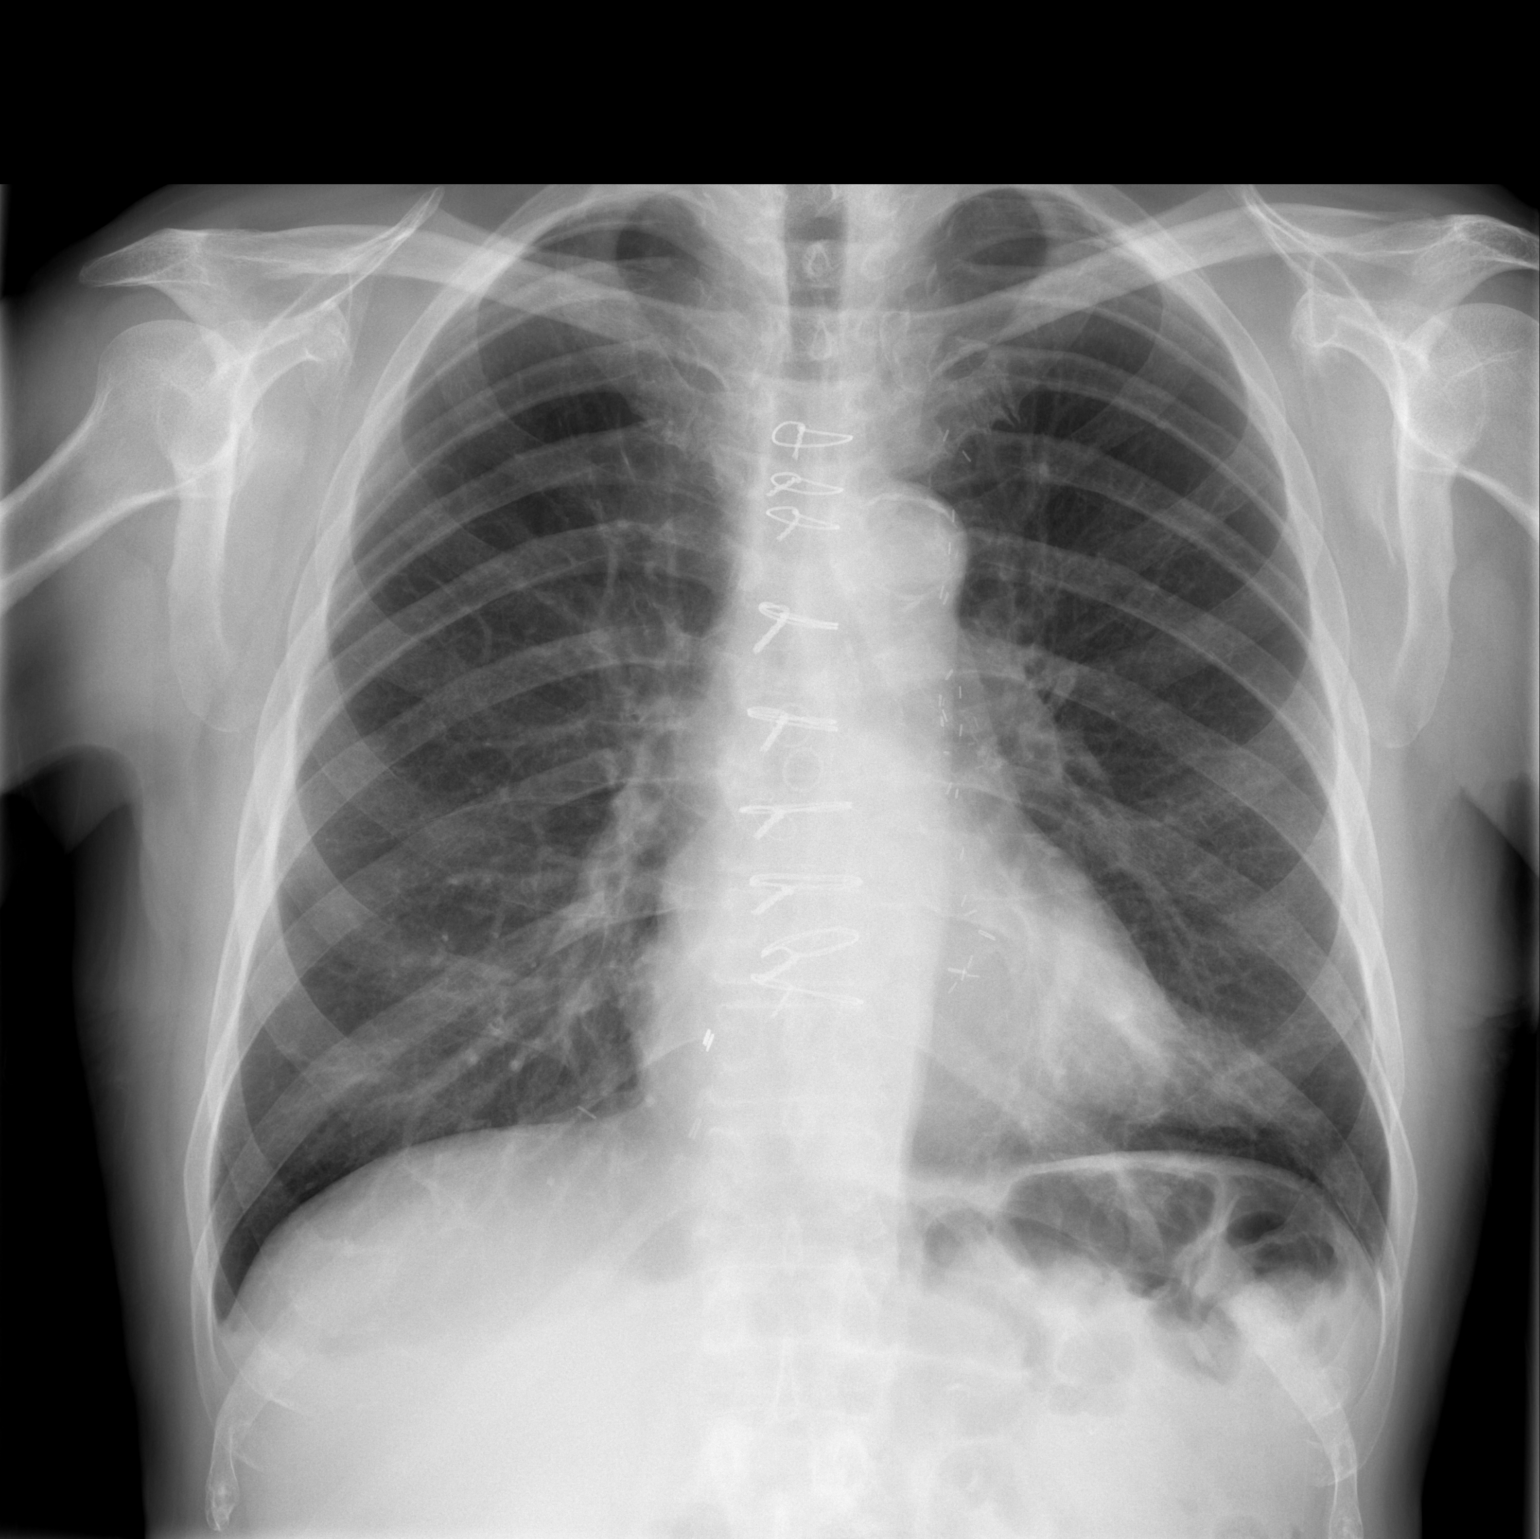

[w chest lat]
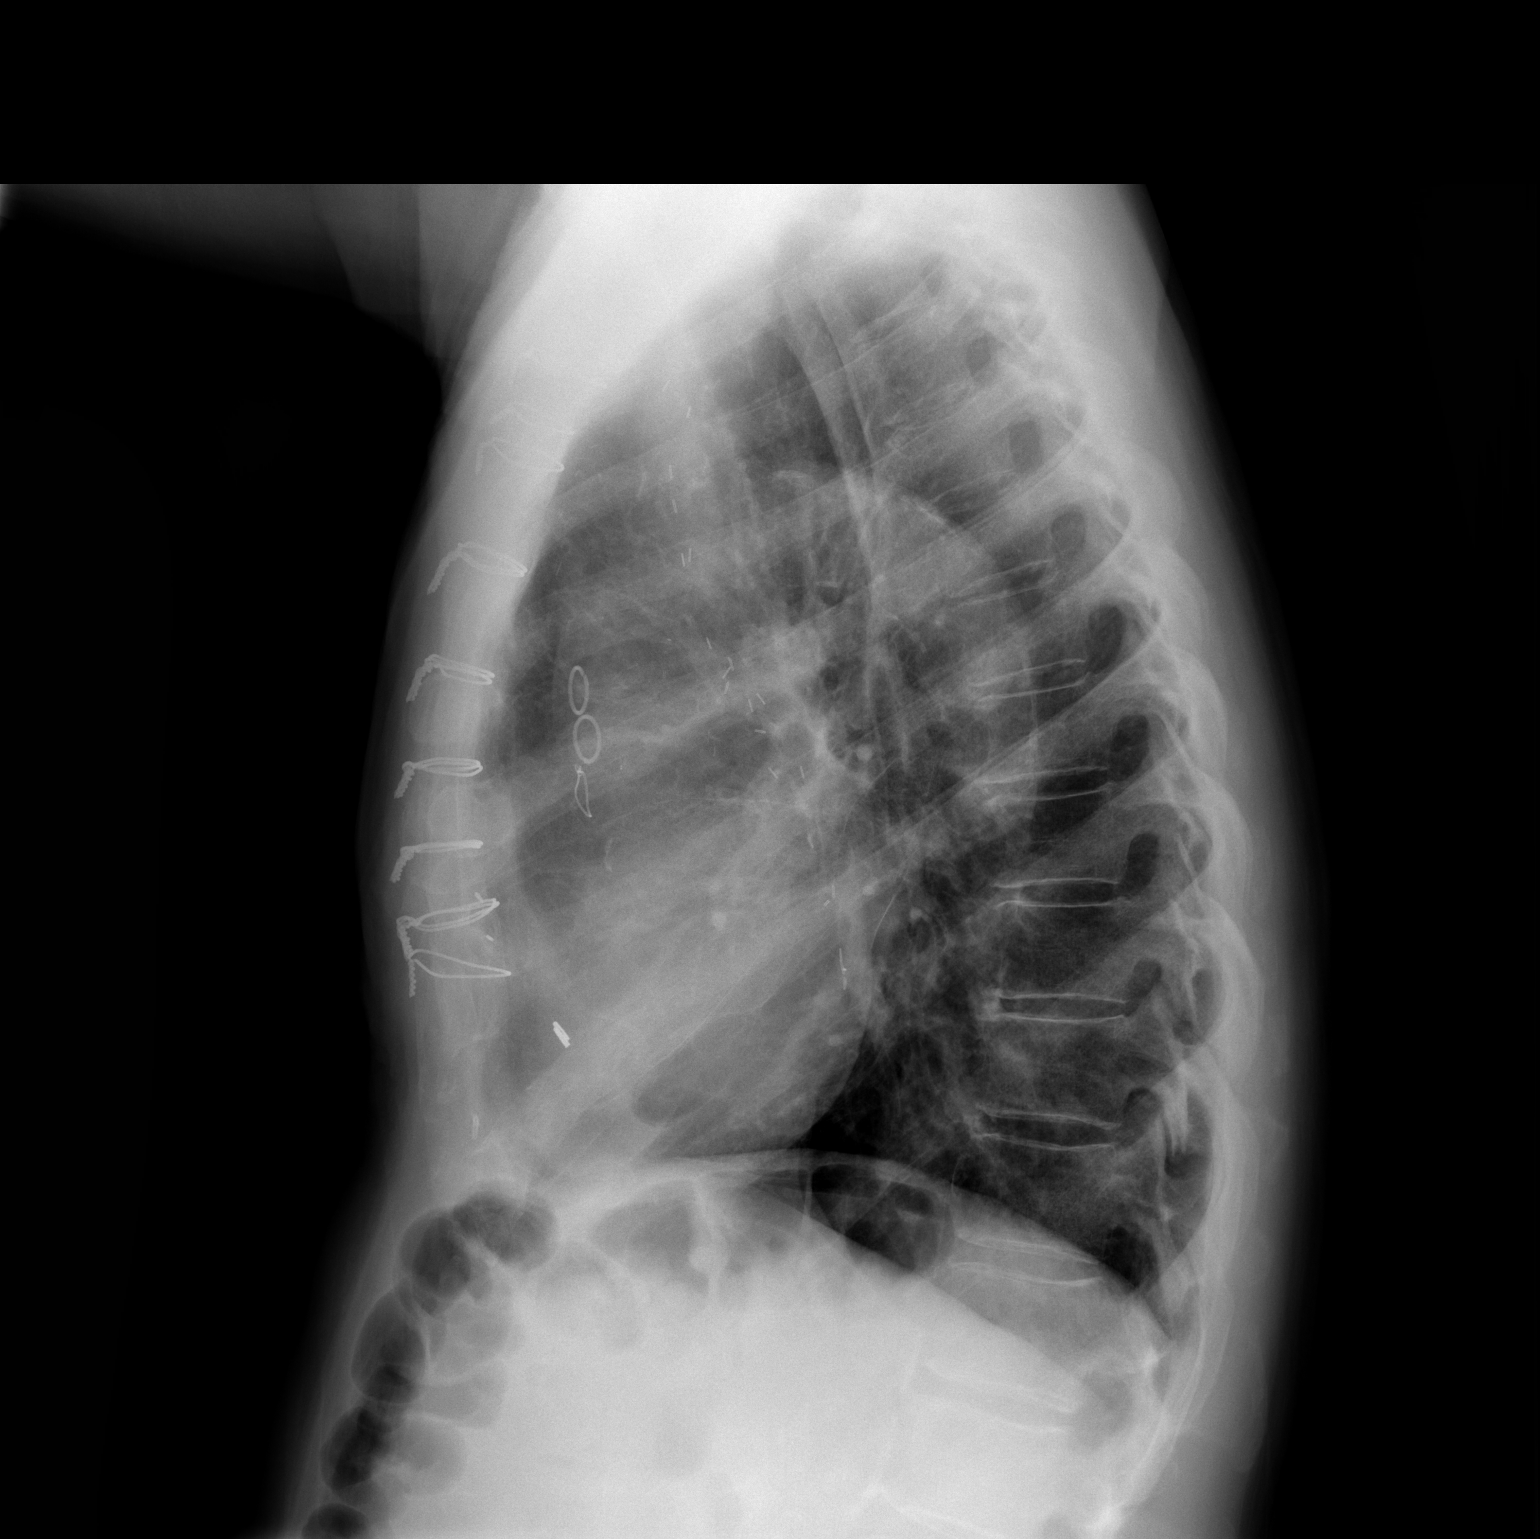

[2 of 2 positions shown; findings below may reference images not displayed]

FINDINGS: The lungs are mildly hyperinflated. There is no focal infiltrate or
pneumothorax. There is density just lateral to the left cardiac apex
which is slightly more conspicuous than in the past that may reflect
minimal atelectasis versus residual scarring. The heart is normal in
size. The pulmonary vascularity is not engorged. There are 9 intact
sternal wires. The mediastinum is normal in width. The trachea is
midline. There is no pleural effusion.
IMPRESSION: COPD. Lingular scarring versus atelectasis slightly more conspicuous
than on the [REDACTED] but improved since [REDACTED] 1503. There
is no evidence of CHF.

## 2015-06-29 ENCOUNTER — Other Ambulatory Visit: Payer: Self-pay | Admitting: Internal Medicine

## 2015-06-29 ENCOUNTER — Other Ambulatory Visit: Payer: Self-pay | Admitting: Cardiovascular Disease

## 2015-07-25 ENCOUNTER — Other Ambulatory Visit: Payer: Self-pay | Admitting: Cardiovascular Disease

## 2015-07-25 ENCOUNTER — Other Ambulatory Visit: Payer: Self-pay | Admitting: Internal Medicine

## 2015-07-26 NOTE — Telephone Encounter (Signed)
Should the patient still be taking this? Please advise. Thanks, MI

## 2015-07-28 NOTE — Telephone Encounter (Signed)
OK to refill

## 2015-08-02 DIAGNOSIS — C61 Malignant neoplasm of prostate: Secondary | ICD-10-CM | POA: Diagnosis not present

## 2015-08-05 ENCOUNTER — Encounter: Payer: Self-pay | Admitting: Internal Medicine

## 2015-08-10 DIAGNOSIS — Z0000 Encounter for general adult medical examination without abnormal findings: Secondary | ICD-10-CM | POA: Diagnosis not present

## 2015-08-10 DIAGNOSIS — C61 Malignant neoplasm of prostate: Secondary | ICD-10-CM | POA: Diagnosis not present

## 2015-08-10 DIAGNOSIS — N5201 Erectile dysfunction due to arterial insufficiency: Secondary | ICD-10-CM | POA: Diagnosis not present

## 2015-09-12 ENCOUNTER — Encounter: Payer: Self-pay | Admitting: Internal Medicine

## 2015-09-12 ENCOUNTER — Ambulatory Visit (INDEPENDENT_AMBULATORY_CARE_PROVIDER_SITE_OTHER): Payer: Medicare Other | Admitting: Internal Medicine

## 2015-09-12 VITALS — BP 120/64 | HR 65 | Wt 180.0 lb

## 2015-09-12 DIAGNOSIS — M5440 Lumbago with sciatica, unspecified side: Secondary | ICD-10-CM | POA: Diagnosis not present

## 2015-09-12 DIAGNOSIS — I2510 Atherosclerotic heart disease of native coronary artery without angina pectoris: Secondary | ICD-10-CM

## 2015-09-12 DIAGNOSIS — M150 Primary generalized (osteo)arthritis: Secondary | ICD-10-CM | POA: Diagnosis not present

## 2015-09-12 DIAGNOSIS — T148 Other injury of unspecified body region: Secondary | ICD-10-CM

## 2015-09-12 DIAGNOSIS — T148XXA Other injury of unspecified body region, initial encounter: Secondary | ICD-10-CM

## 2015-09-12 DIAGNOSIS — E785 Hyperlipidemia, unspecified: Secondary | ICD-10-CM | POA: Diagnosis not present

## 2015-09-12 DIAGNOSIS — G8929 Other chronic pain: Secondary | ICD-10-CM

## 2015-09-12 DIAGNOSIS — I480 Paroxysmal atrial fibrillation: Secondary | ICD-10-CM

## 2015-09-12 DIAGNOSIS — M159 Polyosteoarthritis, unspecified: Secondary | ICD-10-CM

## 2015-09-12 DIAGNOSIS — I739 Peripheral vascular disease, unspecified: Secondary | ICD-10-CM

## 2015-09-12 NOTE — Patient Instructions (Signed)
Arnica topical for bruising

## 2015-09-12 NOTE — Progress Notes (Signed)
Subjective:  Patient ID: [name], male    DOB: [birth year]  Age: 75 y.o. MRN: [id number]  CC: No chief complaint on file.   HPI [name] presents for LBP, OA, CAD, COPD. Pt goes to [place] q 3 mo. He was taken off Folic acid and something else...  Outpatient Prescriptions Prior to Visit  Medication Sig Dispense Refill  . amiodarone (PACERONE) 200 MG tablet Take 1 tablet by mouth daily.    Marland Kitchen atorvastatin (LIPITOR) 40 MG tablet TAKE 1 TABLET AT BEDTIME 90 tablet 1  . clopidogrel (PLAVIX) 75 MG tablet Take 1 tablet (75 mg total) by mouth daily. 90 tablet 2  . ELIQUIS 5 MG TABS tablet TAKE 1 TABLET TWICE A DAY 180 tablet 1  . fluticasone (FLONASE) 50 MCG/ACT nasal spray Place 1 spray into both nostrils daily.    . fluticasone furoate-vilanterol (BREO ELLIPTA) 100-25 MCG/INH AEPB Inhale 1 puff into the lungs daily. 1 each 5  . folic acid (FOLVITE) 1 MG tablet TAKE 1 TABLET DAILY 90 tablet 3  . furosemide (LASIX) 20 MG tablet TAKE 1 TABLET EVERY OTHER DAY 90 tablet 1  . halobetasol (ULTRAVATE) 0.05 % ointment Apply topically 2 (two) times daily. 100 g 3  . KLOR-CON 8 MEQ tablet TAKE 1 TABLET DAILY 90 tablet 3  . loratadine (CLARITIN) 10 MG tablet Take 10 mg by mouth daily.    . metoprolol (LOPRESSOR) 50 MG tablet Take 50 mg by mouth 2 (two) times daily.    . nitroGLYCERIN (NITROSTAT) 0.4 MG SL tablet Place 0.4 mg under the tongue as needed. Chest pain    . pantoprazole (PROTONIX) 40 MG tablet TAKE 1 TABLET DAILY 90 tablet 3  . traMADol (ULTRAM) 50 MG tablet Take 50 mg by mouth every 6 (six) hours as needed. Pain    . zolpidem (AMBIEN) 5 MG tablet Take 5 mg by mouth at bedtime.    Marland Kitchen ELIQUIS 5 MG TABS tablet Take 1 tablet by mouth 2 (two) times daily.    . folic acid (FOLVITE) 1 MG tablet Take 1 mg by mouth daily.    . furosemide (LASIX) 20 MG tablet Take 20 mg by mouth every other day.     Facility-Administered Medications Prior to Visit  Medication Dose Route Frequency  Provider Last Rate Last Dose  . methylPREDNISolone acetate (DEPO-MEDROL) injection 20 mg  20 mg Intra-articular Once Aleksei Plotnikov V, MD        ROS Review of Systems  Constitutional: Negative for appetite change, fatigue and unexpected weight change.  HENT: Negative for congestion, nosebleeds, sneezing, sore throat and trouble swallowing.   Eyes: Negative for itching and visual disturbance.  Respiratory: Positive for shortness of breath. Negative for cough.   Cardiovascular: Negative for chest pain, palpitations and leg swelling.  Gastrointestinal: Negative for nausea, diarrhea, blood in stool and abdominal distention.  Genitourinary: Negative for frequency and hematuria.  Musculoskeletal: Positive for back pain and gait problem. Negative for joint swelling and neck pain.  Skin: Negative for rash.  Neurological: Negative for dizziness, tremors, speech difficulty and weakness.  Psychiatric/Behavioral: Negative for suicidal ideas, sleep disturbance, dysphoric mood and agitation. The patient is not nervous/anxious.     Objective:  BP 120/64 mmHg  Pulse 65  Wt 180 lb (81.647 kg)  SpO2 96%  BP Readings from Last 3 Encounters:  09/12/15 120/64  06/24/15 124/80  06/08/15 120/78    Wt Readings from Last 3 Encounters:  09/12/15 180 lb (81.647 kg)  06/24/15 171 lb (77.565 kg)  06/08/15 176 lb (79.833 kg)    Physical Exam  Constitutional: He is oriented to person, place, and time. He appears well-developed. No distress.  NAD  HENT:  Mouth/Throat: Oropharynx is clear and moist.  Eyes: Conjunctivae are normal. Pupils are equal, round, and reactive to light.  Neck: Normal range of motion. No JVD present. No thyromegaly present.  Cardiovascular: Normal rate, regular rhythm, normal heart sounds and intact distal pulses.  Exam reveals no gallop and no friction rub.   No murmur heard. Pulmonary/Chest: Effort normal and breath sounds normal. No respiratory distress. He has no wheezes.  He has no rales. He exhibits no tenderness.  Abdominal: Soft. Bowel sounds are normal. He exhibits no distension and no mass. There is no tenderness. There is no rebound and no guarding.  Musculoskeletal: Normal range of motion. He exhibits tenderness. He exhibits no edema.  Lymphadenopathy:    He has no cervical adenopathy.  Neurological: He is alert and oriented to person, place, and time. He has normal reflexes. No cranial nerve deficit. He exhibits normal muscle tone. He displays a negative Romberg sign. Coordination and gait normal.  Skin: Skin is warm and dry. No rash noted.  Psychiatric: He has a normal mood and affect. His behavior is normal. Judgment and thought content normal.  LS is tender Walker Bruised forearms  Lab Results  Component Value Date   WBC 7.6 10/26/2014   HGB 14.2 10/26/2014   HCT 42.2 10/26/2014   PLT 229.0 10/26/2014   GLUCOSE 64* 02/08/2015   CHOL 118 10/26/2014   TRIG 109.0 10/26/2014   HDL 40.50 10/26/2014   LDLCALC 56 10/26/2014   ALT 44 10/26/2014   AST 28 10/26/2014   NA 139 02/08/2015   K 4.4 02/08/2015   CL 101 02/08/2015   CREATININE 1.15 02/08/2015   BUN 18 02/08/2015   CO2 31 02/08/2015   TSH 1.98 10/26/2014   PSA 0.01* 02/20/2012   INR 1.30 05/28/2014   HGBA1C 5.7 07/11/2006    Dg Chest 2 View  06/08/2015  CLINICAL DATA:  Cough and shortness of breath for the past week; history of pneumonia, coronary artery disease status post MI and CABG and stent placement, current smoker. EXAM: CHEST  2 VIEW COMPARISON:  PA and lateral chest x-ray of July 08, 2014. FINDINGS: The lungs remain mildly hyperinflated. There is no focal infiltrate. There is no pleural effusion. The heart is normal in size. There are post CABG changes. The pulmonary vascularity is normal. The mediastinum is normal in width. The trachea is midline. The bony thorax exhibits no acute abnormality. IMPRESSION: COPD, post CABG changes. There is no evidence of pneumonia, CHF, nor  other acute cardiopulmonary abnormality. Electronically Signed   By: David  Martinique M.D.   On: 06/08/2015 14:18    Assessment & Plan:   There are no diagnoses linked to this encounter. I am having [name] maintain his metoprolol, nitroGLYCERIN, loratadine, traMADol, amiodarone, fluticasone, zolpidem, halobetasol, fluticasone furoate-vilanterol, KLOR-CON, ELIQUIS, furosemide, atorvastatin, clopidogrel, folic acid, and pantoprazole. We will continue to administer methylPREDNISolone acetate.  No orders of the defined types were placed in this encounter.     Follow-up: No Follow-up on file.  Walker Kehr, MD

## 2015-09-12 NOTE — Assessment & Plan Note (Signed)
Using a walker/cane Tylenol prn Tramadol at hs  Potential benefits of a long term opioids use as well as potential risks (i.e. addiction risk, apnea etc) and complications (i.e. Somnolence, constipation and others) were explained to the patient and were aknowledged.

## 2015-09-12 NOTE — Assessment & Plan Note (Signed)
No worse 

## 2015-09-12 NOTE — Progress Notes (Signed)
Pre visit review using our clinic review tool, if applicable. No additional management support is needed unless otherwise documented below in the visit note. 

## 2015-09-12 NOTE — Assessment & Plan Note (Signed)
On Eliquis, Lipitor, Plavix

## 2015-09-12 NOTE — Assessment & Plan Note (Signed)
Doing well overall On Eliquis, Amiodarone 

## 2015-09-12 NOTE — Assessment & Plan Note (Signed)
On Eliquis Try Vit C, Arnica

## 2015-09-12 NOTE — Assessment & Plan Note (Signed)
Chronic On Lipitor 

## 2015-09-15 ENCOUNTER — Other Ambulatory Visit: Payer: Self-pay | Admitting: Cardiovascular Disease

## 2015-09-24 ENCOUNTER — Other Ambulatory Visit: Payer: Self-pay | Admitting: Internal Medicine

## 2015-10-03 ENCOUNTER — Other Ambulatory Visit: Payer: Self-pay | Admitting: Cardiovascular Disease

## 2015-10-03 DIAGNOSIS — I739 Peripheral vascular disease, unspecified: Secondary | ICD-10-CM

## 2015-10-04 ENCOUNTER — Other Ambulatory Visit: Payer: Self-pay | Admitting: Cardiovascular Disease

## 2015-10-13 ENCOUNTER — Ambulatory Visit (HOSPITAL_COMMUNITY)
Admission: RE | Admit: 2015-10-13 | Discharge: 2015-10-13 | Disposition: A | Discharge status: Home / Self Care | Payer: Medicare Other | Source: Ambulatory Visit | Attending: Cardiovascular Disease | Admitting: Cardiovascular Disease

## 2015-10-13 DIAGNOSIS — J449 Chronic obstructive pulmonary disease, unspecified: Secondary | ICD-10-CM | POA: Insufficient documentation

## 2015-10-13 DIAGNOSIS — I70203 Unspecified atherosclerosis of native arteries of extremities, bilateral legs: Secondary | ICD-10-CM | POA: Insufficient documentation

## 2015-10-13 DIAGNOSIS — I2510 Atherosclerotic heart disease of native coronary artery without angina pectoris: Secondary | ICD-10-CM | POA: Diagnosis not present

## 2015-10-13 DIAGNOSIS — E785 Hyperlipidemia, unspecified: Secondary | ICD-10-CM | POA: Diagnosis not present

## 2015-10-13 DIAGNOSIS — I779 Disorder of arteries and arterioles, unspecified: Secondary | ICD-10-CM | POA: Insufficient documentation

## 2015-10-13 DIAGNOSIS — I739 Peripheral vascular disease, unspecified: Secondary | ICD-10-CM | POA: Insufficient documentation

## 2015-10-13 DIAGNOSIS — Z720 Tobacco use: Secondary | ICD-10-CM | POA: Insufficient documentation

## 2015-10-13 DIAGNOSIS — I6523 Occlusion and stenosis of bilateral carotid arteries: Secondary | ICD-10-CM | POA: Insufficient documentation

## 2015-10-17 ENCOUNTER — Telehealth: Payer: Self-pay | Admitting: Cardiovascular Disease

## 2015-10-17 NOTE — Telephone Encounter (Signed)
New message ° ° ° ° ° °Returning a call to the nurse to get test results °

## 2015-10-17 NOTE — Telephone Encounter (Signed)
PT  AWARE OF  TEST  RESULTS  ./CY 

## 2015-12-19 ENCOUNTER — Encounter: Payer: Self-pay | Admitting: Internal Medicine

## 2015-12-19 ENCOUNTER — Ambulatory Visit (INDEPENDENT_AMBULATORY_CARE_PROVIDER_SITE_OTHER): Payer: Medicare Other | Admitting: Internal Medicine

## 2015-12-19 ENCOUNTER — Other Ambulatory Visit (INDEPENDENT_AMBULATORY_CARE_PROVIDER_SITE_OTHER): Payer: Medicare Other

## 2015-12-19 DIAGNOSIS — I480 Paroxysmal atrial fibrillation: Secondary | ICD-10-CM | POA: Diagnosis not present

## 2015-12-19 DIAGNOSIS — Z23 Encounter for immunization: Secondary | ICD-10-CM

## 2015-12-19 DIAGNOSIS — C61 Malignant neoplasm of prostate: Secondary | ICD-10-CM | POA: Insufficient documentation

## 2015-12-19 DIAGNOSIS — B079 Viral wart, unspecified: Secondary | ICD-10-CM | POA: Diagnosis not present

## 2015-12-19 DIAGNOSIS — I2510 Atherosclerotic heart disease of native coronary artery without angina pectoris: Secondary | ICD-10-CM

## 2015-12-19 DIAGNOSIS — M5440 Lumbago with sciatica, unspecified side: Secondary | ICD-10-CM | POA: Diagnosis not present

## 2015-12-19 DIAGNOSIS — J410 Simple chronic bronchitis: Secondary | ICD-10-CM | POA: Diagnosis not present

## 2015-12-19 DIAGNOSIS — G4700 Insomnia, unspecified: Secondary | ICD-10-CM | POA: Insufficient documentation

## 2015-12-19 DIAGNOSIS — E785 Hyperlipidemia, unspecified: Secondary | ICD-10-CM

## 2015-12-19 DIAGNOSIS — G8929 Other chronic pain: Secondary | ICD-10-CM

## 2015-12-19 LAB — HEPATIC FUNCTION PANEL
ALK PHOS: 75 U/L (ref 39–117)
ALT: 29 U/L (ref 0–53)
AST: 22 U/L (ref 0–37)
Albumin: 4.3 g/dL (ref 3.5–5.2)
BILIRUBIN TOTAL: 0.5 mg/dL (ref 0.2–1.2)
Bilirubin, Direct: 0.1 mg/dL (ref 0.0–0.3)
Total Protein: 6.8 g/dL (ref 6.0–8.3)

## 2015-12-19 LAB — BASIC METABOLIC PANEL
BUN: 17 mg/dL (ref 6–23)
CHLORIDE: 102 meq/L (ref 96–112)
CO2: 27 mEq/L (ref 19–32)
Calcium: 9.1 mg/dL (ref 8.4–10.5)
Creatinine, Ser: 1.09 mg/dL (ref 0.40–1.50)
GFR: 69.73 mL/min (ref >60.00)
Glucose, Bld: 85 mg/dL (ref 70–99)
POTASSIUM: 4.6 meq/L (ref 3.5–5.1)
SODIUM: 136 meq/L (ref 135–145)

## 2015-12-19 LAB — TSH: TSH: 1.82 u[IU]/mL (ref 0.35–4.50)

## 2015-12-19 MED ORDER — ZOLPIDEM TARTRATE 5 MG PO TABS: 5.0000 mg | ORAL_TABLET | Freq: Every day | ORAL | 3 refills | Status: DC

## 2015-12-19 NOTE — Progress Notes (Signed)
Pre visit review using our clinic review tool, if applicable. No additional management support is needed unless otherwise documented below in the visit note. 

## 2015-12-19 NOTE — Assessment & Plan Note (Signed)
Lipitor 

## 2015-12-19 NOTE — Assessment & Plan Note (Signed)
Dr Alinda Money - s/p surgery and XRT 2010 Urol q 12 mo +PSA

## 2015-12-19 NOTE — Assessment & Plan Note (Signed)
Using a walker/cane Tylenol prn Tramadol at hs  Potential benefits of a long term opioids use as well as potential risks (i.e. addiction risk, apnea etc) and complications (i.e. Somnolence, constipation and others) were explained to the patient and were aknowledged.

## 2015-12-19 NOTE — Progress Notes (Signed)
Subjective:  Patient ID: [name], male    DOB: 06/20/1940  Age: 75 y.o. MRN: [id number]  CC: No chief complaint on file.   HPI [name] presents for LBP, OA, COPD f/u C/o insomnia  Outpatient Medications Prior to Visit  Medication Sig Dispense Refill  . atorvastatin (LIPITOR) 40 MG tablet TAKE 1 TABLET AT BEDTIME 90 tablet 1  . clopidogrel (PLAVIX) 75 MG tablet Take 1 tablet (75 mg total) by mouth daily. 90 tablet 2  . ELIQUIS 5 MG TABS tablet TAKE 1 TABLET TWICE A DAY 180 tablet 1  . fluticasone (FLONASE) 50 MCG/ACT nasal spray USE ONE SPRAY INTO BOTH NOSTRILS AS NEEDED FOR ALLERGIES 16 g 1  . furosemide (LASIX) 20 MG tablet TAKE 1 TABLET EVERY OTHER DAY 90 tablet 1  . halobetasol (ULTRAVATE) 0.05 % ointment Apply topically 2 (two) times daily. 100 g 3  . KLOR-CON 8 MEQ tablet TAKE 1 TABLET DAILY 90 tablet 3  . loratadine (CLARITIN) 10 MG tablet Take 10 mg by mouth daily.    . metoprolol (LOPRESSOR) 50 MG tablet Take 1 tablet (50 mg total) by mouth 2 (two) times daily. 180 tablet 2  . nitroGLYCERIN (NITROSTAT) 0.4 MG SL tablet Place 0.4 mg under the tongue as needed. Chest pain    . PACERONE 200 MG tablet TAKE 1 TABLET DAILY 90 tablet 2  . pantoprazole (PROTONIX) 40 MG tablet TAKE 1 TABLET DAILY 90 tablet 3  . traMADol (ULTRAM) 50 MG tablet Take 50 mg by mouth every 6 (six) hours as needed. Pain    . zolpidem (AMBIEN) 5 MG tablet Take 5 mg by mouth at bedtime.    . metoprolol (LOPRESSOR) 50 MG tablet Take 50 mg by mouth 2 (two) times daily.    . fluticasone furoate-vilanterol (BREO ELLIPTA) 100-25 MCG/INH AEPB Inhale 1 puff into the lungs daily. (Patient not taking: Reported on 0000000) 1 each 5  . folic acid (FOLVITE) 1 MG tablet TAKE 1 TABLET DAILY (Patient not taking: Reported on 12/19/2015) 90 tablet 3   Facility-Administered Medications Prior to Visit  Medication Dose Route Frequency Provider Last Rate Last Dose  . methylPREDNISolone acetate  (DEPO-MEDROL) injection 20 mg  20 mg Intra-articular Once Cassandria Anger, MD        ROS Review of Systems  Constitutional: Positive for fatigue. Negative for appetite change and unexpected weight change.  HENT: Negative for congestion, nosebleeds, sneezing, sore throat and trouble swallowing.   Eyes: Negative for itching and visual disturbance.  Respiratory: Negative for cough.   Cardiovascular: Negative for chest pain, palpitations and leg swelling.  Gastrointestinal: Negative for abdominal distention, blood in stool, diarrhea and nausea.  Genitourinary: Negative for frequency and hematuria.  Musculoskeletal: Positive for arthralgias, back pain and gait problem. Negative for joint swelling and neck pain.  Skin: Negative for rash.  Neurological: Negative for dizziness, tremors, speech difficulty and weakness.  Psychiatric/Behavioral: Negative for agitation, dysphoric mood and sleep disturbance. The patient is not nervous/anxious.     Objective:  BP 130/78   Pulse (!) 57   Temp 98.4 F (36.9 C) (Oral)   Wt 181 lb (82.1 kg)   SpO2 98%   BMI 25.97 kg/m   BP Readings from Last 3 Encounters:  12/19/15 130/78  09/12/15 120/64  06/24/15 124/80    Wt Readings from Last 3 Encounters:  12/19/15 181 lb (82.1 kg)  09/12/15 180 lb (81.6 kg)  06/24/15 171 lb (77.6 kg)    Physical  Exam  Constitutional: He is oriented to person, place, and time. He appears well-developed. No distress.  NAD  HENT:  Mouth/Throat: Oropharynx is clear and moist.  Eyes: Conjunctivae are normal. Pupils are equal, round, and reactive to light.  Neck: Normal range of motion. No JVD present. No thyromegaly present.  Cardiovascular: Normal rate, regular rhythm, normal heart sounds and intact distal pulses.  Exam reveals no gallop and no friction rub.   No murmur heard. Pulmonary/Chest: Effort normal and breath sounds normal. No respiratory distress. He has no wheezes. He has no rales. He exhibits no  tenderness.  Abdominal: Soft. Bowel sounds are normal. He exhibits no distension and no mass. There is no tenderness. There is no rebound and no guarding.  Musculoskeletal: Normal range of motion. He exhibits no edema or tenderness.  Lymphadenopathy:    He has no cervical adenopathy.  Neurological: He is alert and oriented to person, place, and time. He has normal reflexes. No cranial nerve deficit. He exhibits normal muscle tone. He displays a negative Romberg sign. Coordination and gait normal.  Skin: Skin is warm and dry. No rash noted.  Psychiatric: He has a normal mood and affect. His behavior is normal. Judgment and thought content normal.  a wart on R back   Procedure Note :     Procedure : Cryosurgery   Indication:  Wart(s)     Risks including unsuccessful procedure , bleeding, infection, bruising, scar, a need for a repeat  procedure and others were explained to the patient in detail as well as the benefits. Informed consent was obtained verbally.    1 lesion(s)  on R   was/were treated with liquid nitrogen on a Q-tip in a usual fasion . Band-Aid was applied and antibiotic ointment was given for a later use.   Tolerated well. Complications none.   Postprocedure instructions :     Keep the wounds clean. You can wash them with liquid soap and water. Pat dry with gauze or a Kleenex tissue  Before applying antibiotic ointment and a Band-Aid.   You need to report immediately  if  any signs of infection develop.     Lab Results  Component Value Date   WBC 7.6 10/26/2014   HGB 14.2 10/26/2014   HCT 42.2 10/26/2014   PLT 229.0 10/26/2014   GLUCOSE 64 (L) 02/08/2015   CHOL 118 10/26/2014   TRIG 109.0 10/26/2014   HDL 40.50 10/26/2014   LDLCALC 56 10/26/2014   ALT 44 10/26/2014   AST 28 10/26/2014   NA 139 02/08/2015   K 4.4 02/08/2015   CL 101 02/08/2015   CREATININE 1.15 02/08/2015   BUN 18 02/08/2015   CO2 31 02/08/2015   TSH 1.98 10/26/2014   PSA 0.01 (L)  02/20/2012   INR 1.30 05/28/2014   HGBA1C 5.7 07/11/2006    No results found.  Assessment & Plan:   There are no diagnoses linked to this encounter. I am having Mr. Hoe maintain his nitroGLYCERIN, loratadine, traMADol, zolpidem, halobetasol, fluticasone furoate-vilanterol, KLOR-CON, ELIQUIS, furosemide, atorvastatin, clopidogrel, folic acid, pantoprazole, metoprolol, fluticasone, and PACERONE. We will continue to administer methylPREDNISolone acetate.  No orders of the defined types were placed in this encounter.    Follow-up: No Follow-up on file.  Walker Kehr, MD

## 2015-12-19 NOTE — Assessment & Plan Note (Signed)
In a current smoker Flu shot

## 2015-12-19 NOTE — Addendum Note (Signed)
Addended by: Cresenciano Lick on: 12/19/2015 05:11 PM   Modules accepted: Orders

## 2015-12-19 NOTE — Patient Instructions (Addendum)
Postprocedure instructions :     Keep the wounds clean. You can wash them with liquid soap and water. Pat dry with gauze or a Kleenex tissue  Before applying antibiotic ointment and a Band-Aid.   You need to report immediately  if  any signs of infection develop.    Gluten free trial (no wheat products) for 4-6 weeks. OK to use gluten-free bread and gluten-free pasta.

## 2015-12-19 NOTE — Assessment & Plan Note (Signed)
Zolpidem prn  Potential benefits of a long term benzodiazepines  use as well as potential risks  and complications were explained to the patient and were aknowledged. 

## 2015-12-19 NOTE — Assessment & Plan Note (Signed)
See procedure 

## 2015-12-19 NOTE — Assessment & Plan Note (Signed)
On Eliquis, Amiodarone 

## 2015-12-26 ENCOUNTER — Other Ambulatory Visit: Payer: Self-pay | Admitting: Internal Medicine

## 2016-01-02 ENCOUNTER — Ambulatory Visit (INDEPENDENT_AMBULATORY_CARE_PROVIDER_SITE_OTHER): Payer: Medicare Other | Admitting: Cardiovascular Disease

## 2016-01-02 ENCOUNTER — Encounter: Payer: Self-pay | Admitting: Cardiovascular Disease

## 2016-01-02 VITALS — BP 100/76 | HR 79 | Ht 70.0 in | Wt 182.0 lb

## 2016-01-02 DIAGNOSIS — I739 Peripheral vascular disease, unspecified: Secondary | ICD-10-CM | POA: Diagnosis not present

## 2016-01-02 DIAGNOSIS — I4819 Other persistent atrial fibrillation: Secondary | ICD-10-CM

## 2016-01-02 DIAGNOSIS — I779 Disorder of arteries and arterioles, unspecified: Secondary | ICD-10-CM

## 2016-01-02 DIAGNOSIS — I481 Persistent atrial fibrillation: Secondary | ICD-10-CM | POA: Diagnosis not present

## 2016-01-02 DIAGNOSIS — I2510 Atherosclerotic heart disease of native coronary artery without angina pectoris: Secondary | ICD-10-CM | POA: Diagnosis not present

## 2016-01-02 DIAGNOSIS — Z720 Tobacco use: Secondary | ICD-10-CM | POA: Diagnosis not present

## 2016-01-02 NOTE — Progress Notes (Signed)
Chief Complaint  Patient presents with  . Follow-up    History of Present Illness: 75 yo WM with history of CAD s/p CABG 1993 and redo 2016, PAD, hyperlipidemia, atrial fibrillation/flutter, prostate cancer s/p prostatectomy/radiation who is here today for cardiac follow up. He has been followed in our office since 2011. I initially followed his PAD only. He has had PTA of the occluded left SFA and stent placement May 2011. I then staged intervention of his right SFA severe stenosis and treated this with a [place] atherectomy device in June 2011. He is known to have moderate carotid artery disease. His CAD remained quiet from cath in 1999 until recently. In January 2016 he was admitted to [facility] with NSTEMI and atrial fib with RVR. Cardiac cath showed a critical left main stenosis, LAD, and Circumflex lesions with a left dominant system. The vein to his OM was occluded and the LIMA to the LAD was atretic. He developed cardiogenic shock requiring balloon pump and IV pressor support. He also suffered recurrent V- Fib, with CPR and multiple defibrillations performed. He required ventilator support for respiratory failure. He underwent partially successful PTCA of the distal left main and ostial circumflex. During that hospitalization, a TCTS consult was obtained for consideration of redo CABG, but patient was not felt to be a candidate at that time due to deconditioning. He was discharged to a nursing home on 05/14/2014. He initially did okay, however, he presented back to [facility] with recurrent chest pain on 05/20/14 and was admitted 2/11-2/27/16. He was again found to have a mildly elevated troponin and to be in atrial flutter. He converted to NSR with Amiodarone and was seen by Dr. Servando Snare. Decision was made to pursue re-do CABG (RIMA-Intermediate, S-OM, S-PDA, S-dist LAD). Postop course was notable for recurrent AFib/Flutter and was continued on Amiodarone. He was placed on Eliquis  for stroke prophylaxis.Readmitted 06/22/14 with weakness, atrial flutter. He underwent TEE guided cardioversion on 06/24/14 but has been back in atrial fib. Carotid dopplers February 2016 with mild bilateral disease. Lower ext dopplers July 2017 were stable.    He is here today for follow up. No chest pains or SOB. Feeling well. No syncope. No lower ext swelling. He continues to smoke.  Primary Care Physician: Walker Kehr, MD   Past Medical History:  Diagnosis Date  . ALLERGIC RHINITIS   . CAD (coronary artery disease)    CABG x 2 with LIMA to Circumflex and vein graft to PDA  07/13/1991  . Carotid bruit    bilateral  . COPD (chronic obstructive pulmonary disease) ([place])   . Diverticulosis   . History of transient ischemic attack (TIA)   . Hyperlipidemia   . Osteoarthritis   . Peripheral neuropathy ([place])   . Personal history of prostate cancer   . PVD (peripheral vascular disease) with claudication ([place])   . STEMI (ST elevation myocardial infarction) ([place]) 04/29/2014   PTCA Lmain and CFX, in setting of VF/VT arrest and CGS  . Tobacco use disorder, continuous   . Ventral hernia     Past Surgical History:  Procedure Laterality Date  . APPENDECTOMY    . CARDIOVERSION N/A 06/24/2014   Procedure: CARDIOVERSION;  Surgeon: Pixie Casino, MD;  Location: [place];  Service: Cardiovascular;  Laterality: N/A;  . CORONARY ARTERY BYPASS GRAFT  1992  . CORONARY ARTERY BYPASS GRAFT N/A 05/28/2014   Procedure: REDO CORONARY ARTERY BYPASS GRAFTING (CABG);  Surgeon: Grace Isaac, MD;  Location: [facility];  Service: Open Heart Surgery;  Laterality: N/A;  Times 4 using right internal mammary artery to Intermediate artery and endoscopically harvested left saphenous vein to LAD, OM1, and PD coronary arteries.  Marland Kitchen LEFT HEART CATHETERIZATION WITH CORONARY ANGIOGRAM N/A 04/30/2014   Procedure: LEFT HEART CATHETERIZATION WITH CORONARY ANGIOGRAM;  Surgeon: Peter M Martinique, MD; Lmain 90% (s/p PTCA), LAD  80%, CFX 100% (s/p PTCA), RCA OK, LIMA-LAD atretic, SVG-OM 100% (chronic), EF 45%, pt req defib x 13 for VF/VT arrest, CHB w/ tem pacer, IABP and intubation  . LUMBAR LAMINECTOMY  [id number]    X2  . PROSTATECTOMY  05/2008   Dr. Alinda Money and XRT  . TEE WITHOUT CARDIOVERSION N/A 05/28/2014   Procedure: TRANSESOPHAGEAL ECHOCARDIOGRAM (TEE);  Surgeon: Grace Isaac, MD;  Location: [place];  Service: Open Heart Surgery;  Laterality: N/A;  . TEE WITHOUT CARDIOVERSION N/A 06/24/2014   Procedure: TRANSESOPHAGEAL ECHOCARDIOGRAM (TEE);  Surgeon: Pixie Casino, MD;  Location: [facility] ENDOSCOPY;  Service: Cardiovascular;  Laterality: N/A;  . TONSILLECTOMY      Current Outpatient Prescriptions  Medication Sig Dispense Refill  . atorvastatin (LIPITOR) 40 MG tablet TAKE 1 TABLET AT BEDTIME 90 tablet 1  . clopidogrel (PLAVIX) 75 MG tablet Take 1 tablet (75 mg total) by mouth daily. 90 tablet 2  . ELIQUIS 5 MG TABS tablet TAKE 1 TABLET TWICE A DAY 180 tablet 1  . fluticasone (FLONASE) 50 MCG/ACT nasal spray USE ONE SPRAY INTO BOTH NOSTRILS AS NEEDED FOR ALLERGIES 16 g 1  . furosemide (LASIX) 20 MG tablet TAKE 1 TABLET EVERY OTHER DAY 90 tablet 1  . halobetasol (ULTRAVATE) 0.05 % ointment Apply topically 2 (two) times daily. 100 g 3  . KLOR-CON 8 MEQ tablet TAKE 1 TABLET DAILY 90 tablet 3  . loratadine (CLARITIN) 10 MG tablet Take 10 mg by mouth daily.    . metoprolol (LOPRESSOR) 50 MG tablet Take 1 tablet (50 mg total) by mouth 2 (two) times daily. 180 tablet 2  . nitroGLYCERIN (NITROSTAT) 0.4 MG SL tablet Place 0.4 mg under the tongue as needed. Chest pain    . PACERONE 200 MG tablet TAKE 1 TABLET DAILY 90 tablet 2  . pantoprazole (PROTONIX) 40 MG tablet TAKE 1 TABLET DAILY 90 tablet 3  . traMADol (ULTRAM) 50 MG tablet Take 50 mg by mouth every 6 (six) hours as needed. Pain    . zolpidem (AMBIEN) 5 MG tablet Take 1 tablet (5 mg total) by mouth at bedtime. 30 tablet 3   Current Facility-Administered  Medications  Medication Dose Route Frequency Provider Last Rate Last Dose  . methylPREDNISolone acetate (DEPO-MEDROL) injection 20 mg  20 mg Intra-articular Once Cassandria Anger, MD        Allergies  Allergen Reactions  . Adhesive [Tape] Other (See Comments)    Takes skin off   . Codeine Sulfate Itching and Other (See Comments)    headaches    Social History   Social History  . Marital status: Married    Spouse name: N/A  . Number of children: N/A  . Years of education: N/A   Occupational History  . Retired-self employed Retired   Social History Main Topics  . Smoking status: Current Every Day Smoker    Years: 40.00    Types: Cigarettes  . Smokeless tobacco: Not on file  . Alcohol use No  . Drug use: No  . Sexual activity: Yes   Other Topics Concern  . Not on file   Social  History Narrative  . No narrative on file    Family History  Problem Relation Age of Onset  . Hypertension    . Cancer Mother     Colon  . Heart disease Father   . Coronary artery disease Father   . Cancer Sister   . Heart disease Brother     Review of Systems:  As stated in the HPI and otherwise negative.   BP 100/76 (BP Location: Left Arm, Patient Position: Sitting, Cuff Size: Normal)   Pulse 79   Ht 5\' 10"  (1.778 m)   Wt 82.6 kg (182 lb)   BMI 26.11 kg/m   Physical Examination: General: Well developed, well nourished, NAD  HEENT: OP clear, mucus membranes moist  SKIN: warm, dry. No rashes. Neuro: No focal deficits  Musculoskeletal: Muscle strength 5/5 all ext  Psychiatric: Mood and affect normal  Neck: No JVD, no carotid bruits, no thyromegaly, no lymphadenopathy.  Lungs:Clear bilaterally, no wheezes, rhonci, crackles Cardiovascular: Irreg irreg No murmurs, gallops or rubs. Abdomen:Soft. Bowel sounds present. Non-tender.  Extremities: No lower extremity edema. Pulses are 1-2 + in the bilateral DP/PT.  EKG:  EKG is  ordered today. The ekg ordered today demonstrates  atrial fib, rate 79 bpm. LAFB.   Recent Labs: 12/19/2015: ALT 29; BUN 17; Creatinine, Ser 1.09; Potassium 4.6; Sodium 136; TSH 1.82   Lipid Panel    Component Value Date/Time   CHOL 118 10/26/2014 1409   TRIG 109.0 10/26/2014 1409   TRIG 91 04/22/2006 0854   HDL 40.50 10/26/2014 1409   CHOLHDL 3 10/26/2014 1409   VLDL 21.8 10/26/2014 1409   LDLCALC 56 10/26/2014 1409     Wt Readings from Last 3 Encounters:  01/02/16 82.6 kg (182 lb)  12/19/15 82.1 kg (181 lb)  09/12/15 81.6 kg (180 lb)     Other studies Reviewed: Additional studies/ records that were reviewed today include: . Review of the above records demonstrates:    Assessment and Plan:   1. CAD: Stable post re-do bypass February 2016. He has had no chest pain. Continue Plavix, statin and beta blocker.   2. PAD: Stable. ABI June 2017 stable.   3. Atrial fibrillation/flutter: He is in atrial fib today. HR is controlled. Continue amiodarone and Lopressor. He is being anti-coagulated with Eliquis. No bleeding issues. Cardioversion 06/24/14 but back in atrial fib. No plans to repeat cardioversion at this time as he is rate controlled and doing well. May consider stopping amiodarone at next visit.   4. Tobacco abuse: smoking cessation recommended. He does not wish to stop smoking.   5. Carotid artery disease: Moderate bilateral disease July 2017. Repeat dopplers June 2018.   Current medicines are reviewed at length with the patient today.  The patient does not have concerns regarding medicines.  The following changes have been made:  no change  Labs/ tests ordered today include:   Orders Placed This Encounter  Procedures  . EKG 12-Lead    Disposition:   FU with me in 6 months  Signed, Lauree Chandler, MD 01/02/2016 9:05 AM    [address], [place], [place]  [address] Phone: [phone]; Fax: [phone]

## 2016-01-02 NOTE — Patient Instructions (Signed)

## 2016-01-26 ENCOUNTER — Other Ambulatory Visit: Payer: Self-pay | Admitting: Internal Medicine

## 2016-02-28 DIAGNOSIS — H33102 Unspecified retinoschisis, left eye: Secondary | ICD-10-CM | POA: Diagnosis not present

## 2016-02-28 DIAGNOSIS — H2511 Age-related nuclear cataract, right eye: Secondary | ICD-10-CM | POA: Diagnosis not present

## 2016-02-28 DIAGNOSIS — H35371 Puckering of macula, right eye: Secondary | ICD-10-CM | POA: Diagnosis not present

## 2016-02-28 DIAGNOSIS — Z961 Presence of intraocular lens: Secondary | ICD-10-CM | POA: Diagnosis not present

## 2016-02-28 DIAGNOSIS — H35033 Hypertensive retinopathy, bilateral: Secondary | ICD-10-CM | POA: Diagnosis not present

## 2016-03-09 DIAGNOSIS — Z961 Presence of intraocular lens: Secondary | ICD-10-CM | POA: Diagnosis not present

## 2016-03-09 DIAGNOSIS — H10011 Acute follicular conjunctivitis, right eye: Secondary | ICD-10-CM | POA: Diagnosis not present

## 2016-03-14 DIAGNOSIS — H10012 Acute follicular conjunctivitis, left eye: Secondary | ICD-10-CM | POA: Diagnosis not present

## 2016-03-14 DIAGNOSIS — H2512 Age-related nuclear cataract, left eye: Secondary | ICD-10-CM | POA: Diagnosis not present

## 2016-03-14 DIAGNOSIS — Z961 Presence of intraocular lens: Secondary | ICD-10-CM | POA: Diagnosis not present

## 2016-03-14 DIAGNOSIS — H10011 Acute follicular conjunctivitis, right eye: Secondary | ICD-10-CM | POA: Diagnosis not present

## 2016-03-19 ENCOUNTER — Ambulatory Visit (INDEPENDENT_AMBULATORY_CARE_PROVIDER_SITE_OTHER): Payer: Medicare Other | Admitting: Internal Medicine

## 2016-03-19 ENCOUNTER — Encounter: Payer: Self-pay | Admitting: Internal Medicine

## 2016-03-19 DIAGNOSIS — I110 Hypertensive heart disease with heart failure: Secondary | ICD-10-CM

## 2016-03-19 DIAGNOSIS — C61 Malignant neoplasm of prostate: Secondary | ICD-10-CM

## 2016-03-19 DIAGNOSIS — Z7901 Long term (current) use of anticoagulants: Secondary | ICD-10-CM | POA: Diagnosis not present

## 2016-03-19 DIAGNOSIS — I480 Paroxysmal atrial fibrillation: Secondary | ICD-10-CM

## 2016-03-19 DIAGNOSIS — Z23 Encounter for immunization: Secondary | ICD-10-CM | POA: Diagnosis not present

## 2016-03-19 DIAGNOSIS — J0100 Acute maxillary sinusitis, unspecified: Secondary | ICD-10-CM

## 2016-03-19 DIAGNOSIS — I2510 Atherosclerotic heart disease of native coronary artery without angina pectoris: Secondary | ICD-10-CM

## 2016-03-19 MED ORDER — CEFDINIR 300 MG PO CAPS: 300.0000 mg | ORAL_CAPSULE | Freq: Two times a day (BID) | ORAL | 0 refills | Status: DC

## 2016-03-19 NOTE — Addendum Note (Signed)
Addended by: Cresenciano Lick on: 03/19/2016 02:22 PM   Modules accepted: Orders

## 2016-03-19 NOTE — Assessment & Plan Note (Signed)
Eliquis Amiodarone

## 2016-03-19 NOTE — Progress Notes (Signed)
Pre visit review using our clinic review tool, if applicable. No additional management support is needed unless otherwise documented below in the visit note. 

## 2016-03-19 NOTE — Progress Notes (Signed)
Subjective:  Patient ID: [name], male    DOB: 1941-03-13  Age: 75 y.o. MRN: [id number]  CC: No chief complaint on file.   HPI [name] presents for COPD, dyslipidemia, A fib C/o eyelid irritation, sticky d/c  Outpatient Medications Prior to Visit  Medication Sig Dispense Refill  . atorvastatin (LIPITOR) 40 MG tablet TAKE 1 TABLET AT BEDTIME 90 tablet 1  . clopidogrel (PLAVIX) 75 MG tablet Take 1 tablet (75 mg total) by mouth daily. 90 tablet 2  . ELIQUIS 5 MG TABS tablet TAKE 1 TABLET TWICE A DAY 180 tablet 1  . fluticasone (FLONASE) 50 MCG/ACT nasal spray USE ONE SPRAY INTO BOTH NOSTRILS AS NEEDED FOR ALLERGIES 16 g 2  . furosemide (LASIX) 20 MG tablet TAKE 1 TABLET EVERY OTHER DAY 90 tablet 1  . halobetasol (ULTRAVATE) 0.05 % ointment Apply topically 2 (two) times daily. 100 g 3  . KLOR-CON 8 MEQ tablet TAKE 1 TABLET DAILY 90 tablet 3  . loratadine (CLARITIN) 10 MG tablet Take 10 mg by mouth daily.    . metoprolol (LOPRESSOR) 50 MG tablet Take 1 tablet (50 mg total) by mouth 2 (two) times daily. 180 tablet 2  . nitroGLYCERIN (NITROSTAT) 0.4 MG SL tablet Place 0.4 mg under the tongue as needed. Chest pain    . PACERONE 200 MG tablet TAKE 1 TABLET DAILY 90 tablet 2  . pantoprazole (PROTONIX) 40 MG tablet TAKE 1 TABLET DAILY 90 tablet 3  . traMADol (ULTRAM) 50 MG tablet Take 50 mg by mouth every 6 (six) hours as needed. Pain    . zolpidem (AMBIEN) 5 MG tablet Take 1 tablet (5 mg total) by mouth at bedtime. 30 tablet 3   Facility-Administered Medications Prior to Visit  Medication Dose Route Frequency Provider Last Rate Last Dose  . methylPREDNISolone acetate (DEPO-MEDROL) injection 20 mg  20 mg Intra-articular Once Cassandria Anger, MD        ROS Review of Systems  Constitutional: Negative for appetite change, fatigue and unexpected weight change.  HENT: Positive for congestion, postnasal drip, sinus pain and sinus pressure. Negative for nosebleeds,  sneezing, sore throat and trouble swallowing.   Eyes: Positive for pain and redness. Negative for itching and visual disturbance.  Respiratory: Negative for cough.   Cardiovascular: Negative for chest pain, palpitations and leg swelling.  Gastrointestinal: Negative for abdominal distention, blood in stool, diarrhea and nausea.  Genitourinary: Negative for frequency and hematuria.  Musculoskeletal: Negative for back pain, gait problem, joint swelling and neck pain.  Skin: Negative for rash.  Neurological: Negative for dizziness, tremors, speech difficulty and weakness.  Psychiatric/Behavioral: Negative for agitation, dysphoric mood and sleep disturbance. The patient is not nervous/anxious.    Eyelids are swollen a little Objective:  BP 100/62   Pulse 68   Wt 186 lb (84.4 kg)   SpO2 96%   BMI 26.69 kg/m   BP Readings from Last 3 Encounters:  03/19/16 100/62  01/02/16 100/76  12/19/15 130/78    Wt Readings from Last 3 Encounters:  03/19/16 186 lb (84.4 kg)  01/02/16 182 lb (82.6 kg)  12/19/15 181 lb (82.1 kg)    Physical Exam  Constitutional: He is oriented to person, place, and time. He appears well-developed. No distress.  NAD  HENT:  Mouth/Throat: Oropharynx is clear and moist.  Eyes: Conjunctivae are normal. Pupils are equal, round, and reactive to light.  Neck: Normal range of motion. No JVD present. No thyromegaly present.  Cardiovascular:  Normal heart sounds and intact distal pulses.  Exam reveals no gallop and no friction rub.   No murmur heard. Pulmonary/Chest: Effort normal. No respiratory distress. He has no wheezes. He has no rales. He exhibits no tenderness.  Abdominal: Soft. Bowel sounds are normal. He exhibits no distension and no mass. There is no tenderness. There is no rebound and no guarding.  Musculoskeletal: Normal range of motion. He exhibits no edema or tenderness.  Lymphadenopathy:    He has no cervical adenopathy.  Neurological: He is alert and  oriented to person, place, and time. He has normal reflexes. No cranial nerve deficit. He exhibits normal muscle tone. He displays a negative Romberg sign. Coordination and gait normal.  Skin: Skin is warm and dry. No rash noted.  Psychiatric: He has a normal mood and affect. His behavior is normal. Judgment and thought content normal.  bloody nasal d/c Eyelids are swollen a little  Lab Results  Component Value Date   WBC 7.6 10/26/2014   HGB 14.2 10/26/2014   HCT 42.2 10/26/2014   PLT 229.0 10/26/2014   GLUCOSE 85 12/19/2015   CHOL 118 10/26/2014   TRIG 109.0 10/26/2014   HDL 40.50 10/26/2014   LDLCALC 56 10/26/2014   ALT 29 12/19/2015   AST 22 12/19/2015   NA 136 12/19/2015   K 4.6 12/19/2015   CL 102 12/19/2015   CREATININE 1.09 12/19/2015   BUN 17 12/19/2015   CO2 27 12/19/2015   TSH 1.82 12/19/2015   PSA 0.01 (L) 02/20/2012   INR 1.30 05/28/2014   HGBA1C 5.7 07/11/2006    No results found.  Assessment & Plan:   There are no diagnoses linked to this encounter. I am having [name] maintain his nitroGLYCERIN, loratadine, traMADol, halobetasol, KLOR-CON, furosemide, clopidogrel, pantoprazole, metoprolol, PACERONE, zolpidem, ELIQUIS, atorvastatin, fluticasone, and prednisoLONE acetate. We will continue to administer methylPREDNISolone acetate.  Meds ordered this encounter  Medications  . prednisoLONE acetate (PRED FORTE) 1 % ophthalmic suspension    Sig: Place 1 drop into both eyes 2 (two) times daily.     Follow-up: No Follow-up on file.  Walker Kehr, MD

## 2016-03-19 NOTE — Assessment & Plan Note (Signed)
Cefdinir x10 d 

## 2016-03-19 NOTE — Assessment & Plan Note (Signed)
Dr Alinda Money appt q12 mo - Feb 2018

## 2016-03-19 NOTE — Assessment & Plan Note (Signed)
On Eliquis

## 2016-03-19 NOTE — Assessment & Plan Note (Signed)
Furosemide, Toprol 

## 2016-03-21 DIAGNOSIS — H10011 Acute follicular conjunctivitis, right eye: Secondary | ICD-10-CM | POA: Diagnosis not present

## 2016-03-21 DIAGNOSIS — Z961 Presence of intraocular lens: Secondary | ICD-10-CM | POA: Diagnosis not present

## 2016-04-24 ENCOUNTER — Other Ambulatory Visit: Payer: Self-pay | Admitting: Cardiovascular Disease

## 2016-05-09 DIAGNOSIS — Z961 Presence of intraocular lens: Secondary | ICD-10-CM | POA: Diagnosis not present

## 2016-05-09 DIAGNOSIS — H2512 Age-related nuclear cataract, left eye: Secondary | ICD-10-CM | POA: Diagnosis not present

## 2016-06-11 ENCOUNTER — Other Ambulatory Visit: Payer: Self-pay | Admitting: Internal Medicine

## 2016-06-11 ENCOUNTER — Other Ambulatory Visit: Payer: Self-pay | Admitting: Cardiovascular Disease

## 2016-06-19 ENCOUNTER — Ambulatory Visit: Payer: Medicare Other | Admitting: Internal Medicine

## 2016-06-23 ENCOUNTER — Other Ambulatory Visit: Payer: Self-pay | Admitting: Cardiovascular Disease

## 2016-06-23 ENCOUNTER — Other Ambulatory Visit: Payer: Self-pay | Admitting: Internal Medicine

## 2016-06-30 ENCOUNTER — Other Ambulatory Visit: Payer: Self-pay | Admitting: Cardiovascular Disease

## 2016-07-04 ENCOUNTER — Encounter: Payer: Self-pay | Admitting: Internal Medicine

## 2016-07-04 ENCOUNTER — Other Ambulatory Visit (INDEPENDENT_AMBULATORY_CARE_PROVIDER_SITE_OTHER): Payer: Medicare Other

## 2016-07-04 ENCOUNTER — Ambulatory Visit (INDEPENDENT_AMBULATORY_CARE_PROVIDER_SITE_OTHER): Payer: Medicare Other | Admitting: Internal Medicine

## 2016-07-04 DIAGNOSIS — T148XXA Other injury of unspecified body region, initial encounter: Secondary | ICD-10-CM

## 2016-07-04 DIAGNOSIS — I2510 Atherosclerotic heart disease of native coronary artery without angina pectoris: Secondary | ICD-10-CM

## 2016-07-04 DIAGNOSIS — Z9861 Coronary angioplasty status: Secondary | ICD-10-CM

## 2016-07-04 DIAGNOSIS — I110 Hypertensive heart disease with heart failure: Secondary | ICD-10-CM | POA: Diagnosis not present

## 2016-07-04 DIAGNOSIS — I739 Peripheral vascular disease, unspecified: Secondary | ICD-10-CM | POA: Diagnosis not present

## 2016-07-04 LAB — HEPATIC FUNCTION PANEL
ALK PHOS: 73 U/L (ref 39–117)
ALT: 26 U/L (ref 0–53)
AST: 22 U/L (ref 0–37)
Albumin: 4.2 g/dL (ref 3.5–5.2)
BILIRUBIN DIRECT: 0.1 mg/dL (ref 0.0–0.3)
BILIRUBIN TOTAL: 0.6 mg/dL (ref 0.2–1.2)
Total Protein: 6.5 g/dL (ref 6.0–8.3)

## 2016-07-04 LAB — BASIC METABOLIC PANEL
BUN: 17 mg/dL (ref 6–23)
CALCIUM: 9.3 mg/dL (ref 8.4–10.5)
CO2: 27 meq/L (ref 19–32)
Chloride: 101 mEq/L (ref 96–112)
Creatinine, Ser: 1.24 mg/dL (ref 0.40–1.50)
GFR: 60 mL/min — AB (ref >60.00)
Glucose, Bld: 115 mg/dL — ABNORMAL HIGH (ref 70–99)
Potassium: 4.6 mEq/L (ref 3.5–5.1)
SODIUM: 134 meq/L — AB (ref 135–145)

## 2016-07-04 NOTE — Assessment & Plan Note (Signed)
Toprol, Furosemide Labs

## 2016-07-04 NOTE — Assessment & Plan Note (Signed)
Chronic on meds

## 2016-07-04 NOTE — Patient Instructions (Signed)
Shingrix vaccine

## 2016-07-04 NOTE — Progress Notes (Signed)
Subjective:  Patient ID: [name], male    DOB: 02/10/1941  Age: 76 y.o. MRN: [id number]  CC: Skin Problem (tip of nose and frontal forehead ); Atrial Flutter; Osteoarthritis; COPD; and Hypertension   HPI [name] presents for dyslipidemia, HTN, LBP f/u  Outpatient Medications Prior to Visit  Medication Sig Dispense Refill  . atorvastatin (LIPITOR) 40 MG tablet TAKE 1 TABLET AT BEDTIME 90 tablet 1  . clopidogrel (PLAVIX) 75 MG tablet TAKE 1 TABLET DAILY 90 tablet 2  . ELIQUIS 5 MG TABS tablet TAKE 1 TABLET TWICE A DAY 180 tablet 1  . fluticasone (FLONASE) 50 MCG/ACT nasal spray USE ONE SPRAY INTO BOTH NOSTRILS AS NEEDED FOR ALLERGIES 16 g 2  . furosemide (LASIX) 20 MG tablet TAKE 1 TABLET EVERY OTHER DAY 90 tablet 1  . halobetasol (ULTRAVATE) 0.05 % ointment Apply topically 2 (two) times daily. 100 g 3  . KLOR-CON 8 MEQ tablet TAKE 1 TABLET DAILY 90 tablet 2  . loratadine (CLARITIN) 10 MG tablet Take 10 mg by mouth daily.    . metoprolol (LOPRESSOR) 50 MG tablet Take 1 tablet (50 mg total) by mouth 2 (two) times daily. 180 tablet 1  . nitroGLYCERIN (NITROSTAT) 0.4 MG SL tablet Place 0.4 mg under the tongue as needed. Chest pain    . PACERONE 200 MG tablet TAKE 1 TABLET DAILY 90 tablet 1  . pantoprazole (PROTONIX) 40 MG tablet TAKE 1 TABLET DAILY 90 tablet 3  . traMADol (ULTRAM) 50 MG tablet Take 50 mg by mouth every 6 (six) hours as needed. Pain    . zolpidem (AMBIEN) 5 MG tablet Take 1 tablet (5 mg total) by mouth at bedtime. 30 tablet 3  . cefdinir (OMNICEF) 300 MG capsule Take 1 capsule (300 mg total) by mouth 2 (two) times daily. 20 capsule 0  . prednisoLONE acetate (PRED FORTE) 1 % ophthalmic suspension Place 1 drop into both eyes 2 (two) times daily.     Facility-Administered Medications Prior to Visit  Medication Dose Route Frequency Provider Last Rate Last Dose  . methylPREDNISolone acetate (DEPO-MEDROL) injection 20 mg  20 mg Intra-articular Once  Nathan Plotnikov V, MD        ROS Review of Systems  Constitutional: Negative for appetite change, fatigue and unexpected weight change.  HENT: Negative for congestion, nosebleeds, sneezing, sore throat and trouble swallowing.   Eyes: Negative for itching and visual disturbance.  Respiratory: Negative for cough.   Cardiovascular: Negative for chest pain, palpitations and leg swelling.  Gastrointestinal: Negative for abdominal distention, blood in stool, diarrhea and nausea.  Genitourinary: Negative for frequency and hematuria.  Musculoskeletal: Negative for back pain, gait problem, joint swelling and neck pain.  Skin: Negative for rash.  Neurological: Negative for dizziness, tremors, speech difficulty and weakness.  Psychiatric/Behavioral: Negative for agitation, dysphoric mood, sleep disturbance and suicidal ideas. The patient is not nervous/anxious.     Objective:  BP (!) 110/58   Pulse 92   Temp 98.6 F (37 C) (Oral)   Resp 16   Ht 5\' 10"  (1.778 m)   Wt 135 lb 12 oz (61.6 kg)   SpO2 95%   BMI 19.48 kg/m   BP Readings from Last 3 Encounters:  07/04/16 (!) 110/58  03/19/16 100/62  01/02/16 100/76    Wt Readings from Last 3 Encounters:  07/04/16 135 lb 12 oz (61.6 kg)  03/19/16 186 lb (84.4 kg)  01/02/16 182 lb (82.6 kg)    Physical Exam  Constitutional: He is oriented to person, place, and time. He appears well-developed. No distress.  NAD  HENT:  Mouth/Throat: Oropharynx is clear and moist.  Eyes: Conjunctivae are normal. Pupils are equal, round, and reactive to light.  Neck: Normal range of motion. No JVD present. No thyromegaly present.  Cardiovascular: Normal rate, regular rhythm, normal heart sounds and intact distal pulses.  Exam reveals no gallop and no friction rub.   No murmur heard. Pulmonary/Chest: Effort normal and breath sounds normal. No respiratory distress. He has no wheezes. He has no rales. He exhibits no tenderness.  Abdominal: Soft. Bowel  sounds are normal. He exhibits no distension and no mass. There is no tenderness. There is no rebound and no guarding.  Musculoskeletal: Normal range of motion. He exhibits tenderness. He exhibits no edema.  Lymphadenopathy:    He has no cervical adenopathy.  Neurological: He is alert and oriented to person, place, and time. He has normal reflexes. No cranial nerve deficit. He exhibits normal muscle tone. He displays a negative Romberg sign. Coordination abnormal. Gait normal.  Skin: Skin is warm and dry. No rash noted.  Psychiatric: He has a normal mood and affect. His behavior is normal. Judgment and thought content normal.  Aks on nose, forehead Cane LS tender   Lab Results  Component Value Date   WBC 7.6 10/26/2014   HGB 14.2 10/26/2014   HCT 42.2 10/26/2014   PLT 229.0 10/26/2014   GLUCOSE 85 12/19/2015   CHOL 118 10/26/2014   TRIG 109.0 10/26/2014   HDL 40.50 10/26/2014   LDLCALC 56 10/26/2014   ALT 29 12/19/2015   AST 22 12/19/2015   NA 136 12/19/2015   K 4.6 12/19/2015   CL 102 12/19/2015   CREATININE 1.09 12/19/2015   BUN 17 12/19/2015   CO2 27 12/19/2015   TSH 1.82 12/19/2015   PSA 0.01 (L) 02/20/2012   INR 1.30 05/28/2014   HGBA1C 5.7 07/11/2006    No results found.  Assessment & Plan:   There are no diagnoses linked to this encounter. I have discontinued [name]'s prednisoLONE acetate and cefdinir. I am also having him maintain his nitroGLYCERIN, loratadine, traMADol, halobetasol, pantoprazole, zolpidem, fluticasone, clopidogrel, metoprolol, KLOR-CON, ELIQUIS, furosemide, atorvastatin, and PACERONE. We will continue to administer methylPREDNISolone acetate.  No orders of the defined types were placed in this encounter.    Follow-up: No Follow-up on file.  Nathan Kehr, MD

## 2016-07-04 NOTE — Progress Notes (Signed)
Pre-visit discussion using our clinic review tool. No additional management support is needed unless otherwise documented below in the visit note.  

## 2016-07-04 NOTE — Assessment & Plan Note (Signed)
On meds Labs

## 2016-07-04 NOTE — Assessment & Plan Note (Signed)
Plavix, Eliquis, Lipitor

## 2016-07-16 ENCOUNTER — Telehealth: Payer: Self-pay | Admitting: Internal Medicine

## 2016-07-16 NOTE — Telephone Encounter (Signed)
Called patient to schedule awv. Patient did not answer. Will try to call patient again at a later time.

## 2016-07-19 ENCOUNTER — Other Ambulatory Visit: Payer: Self-pay | Admitting: Internal Medicine

## 2016-08-01 DIAGNOSIS — C61 Malignant neoplasm of prostate: Secondary | ICD-10-CM | POA: Diagnosis not present

## 2016-08-04 ENCOUNTER — Other Ambulatory Visit: Payer: Self-pay | Admitting: Internal Medicine

## 2016-08-08 DIAGNOSIS — C61 Malignant neoplasm of prostate: Secondary | ICD-10-CM | POA: Diagnosis not present

## 2016-08-08 DIAGNOSIS — R3121 Asymptomatic microscopic hematuria: Secondary | ICD-10-CM | POA: Diagnosis not present

## 2016-08-22 ENCOUNTER — Encounter: Payer: Self-pay | Admitting: Cardiovascular Disease

## 2016-08-22 ENCOUNTER — Ambulatory Visit (INDEPENDENT_AMBULATORY_CARE_PROVIDER_SITE_OTHER): Payer: Medicare Other | Admitting: Cardiovascular Disease

## 2016-08-22 VITALS — BP 118/62 | HR 73 | Ht 71.0 in | Wt 182.6 lb

## 2016-08-22 DIAGNOSIS — Z9861 Coronary angioplasty status: Secondary | ICD-10-CM | POA: Diagnosis not present

## 2016-08-22 DIAGNOSIS — I739 Peripheral vascular disease, unspecified: Secondary | ICD-10-CM

## 2016-08-22 DIAGNOSIS — Z720 Tobacco use: Secondary | ICD-10-CM | POA: Diagnosis not present

## 2016-08-22 DIAGNOSIS — I2510 Atherosclerotic heart disease of native coronary artery without angina pectoris: Secondary | ICD-10-CM

## 2016-08-22 DIAGNOSIS — I779 Disorder of arteries and arterioles, unspecified: Secondary | ICD-10-CM

## 2016-08-22 DIAGNOSIS — I483 Typical atrial flutter: Secondary | ICD-10-CM

## 2016-08-22 NOTE — Patient Instructions (Signed)
Medication Instructions:  Your physician has recommended you make the following change in your medication:  Stop amiodarone   Labwork: none  Testing/Procedures: Your physician has requested that you have an ankle brachial index (ABI). During this test an ultrasound and blood pressure cuff are used to evaluate the arteries that supply the arms and legs with blood. Allow thirty minutes for this exam. There are no restrictions or special instructions. To be done in July  Your physician has requested that you have a carotid duplex. This test is an ultrasound of the carotid arteries in your neck. It looks at blood flow through these arteries that supply the brain with blood. Allow one hour for this exam. There are no restrictions or special instructions. To be done in July     Follow-Up: Your physician recommends that you schedule a follow-up appointment in: 12 months. Please call our office in about 9 months to schedule this appointment.     Any Other Special Instructions Will Be Listed Below (If Applicable).     If you need a refill on your cardiac medications before your next appointment, please call your pharmacy.

## 2016-08-22 NOTE — Progress Notes (Signed)
Chief Complaint  Patient presents with  . Follow-up    CAD    History of Present Illness: 76 yo WM with history of CAD s/p CABG 1993 and redo 2016, PAD, hyperlipidemia, atrial fibrillation/flutter, prostate cancer s/p prostatectomy/radiation who is here today for cardiac follow up. He has been followed in our office since 2011. I initially followed his PAD only. He has had PTA of the occluded left SFA and stent placement May 2011. I then staged intervention of his right SFA severe stenosis and treated this with a [place] atherectomy device in June 2011. His PAD has remained stable since the. ABI were normal July 2017. He is known to have moderate carotid artery disease, stable by dopplers July 2017. His CAD remained quiet from cath in 1999 until January 2016 when he was admitted with a NSTEMI and atrial fib with RVR. Cardiac cath with critical left main stenosis, LAD and Circumflex disease. Vein graft to OM occluded and LIMA to LAD to atretic. He developed cardiogenic shock with ventricular fibrillation. PTCA of left main and circumflex was performed. CT surgery did not think he was a CABG candidate. He was readmitted with a NSTEMI in February 2016 and underwent re-do CABG (RIMA-Intermediate, S-OM, S-PDA, S-dist LAD).He had atrial fib/flutter post-op. He was placed on Eliquis. He underwent TEE guided DCCV march 2016 but converted back to atrial fib after DCCV.     He is here today for follow up. The patient denies any chest pain, dyspnea, palpitations, lower extremity edema, orthopnea, PND, dizziness, near syncope or syncope. He has chronic back pain with radiation to his legs.   Primary Care Physician: Cassandria Anger, MD   Past Medical History:  Diagnosis Date  . ALLERGIC RHINITIS   . CAD (coronary artery disease)    CABG x 2 with LIMA to Circumflex and vein graft to PDA  07/13/1991  . Carotid bruit    bilateral  . COPD (chronic obstructive pulmonary disease) ([place])   .  Diverticulosis   . History of transient ischemic attack (TIA)   . Hyperlipidemia   . Osteoarthritis   . Peripheral neuropathy   . Personal history of prostate cancer   . PVD (peripheral vascular disease) with claudication ([place])   . STEMI (ST elevation myocardial infarction) ([place]) 04/29/2014   PTCA Lmain and CFX, in setting of VF/VT arrest and CGS  . Tobacco use disorder, continuous   . Ventral hernia     Past Surgical History:  Procedure Laterality Date  . APPENDECTOMY    . CARDIOVERSION N/A 06/24/2014   Procedure: CARDIOVERSION;  Surgeon: Pixie Casino, MD;  Location: [place];  Service: Cardiovascular;  Laterality: N/A;  . CORONARY ARTERY BYPASS GRAFT  1992  . CORONARY ARTERY BYPASS GRAFT N/A 05/28/2014   Procedure: REDO CORONARY ARTERY BYPASS GRAFTING (CABG);  Surgeon: Grace Isaac, MD;  Location: [place];  Service: Open Heart Surgery;  Laterality: N/A;  Times 4 using right internal mammary artery to Intermediate artery and endoscopically harvested left saphenous vein to LAD, OM1, and PD coronary arteries.  Marland Kitchen LEFT HEART CATHETERIZATION WITH CORONARY ANGIOGRAM N/A 04/30/2014   Procedure: LEFT HEART CATHETERIZATION WITH CORONARY ANGIOGRAM;  Surgeon: Peter M Martinique, MD; Lmain 90% (s/p PTCA), LAD 80%, CFX 100% (s/p PTCA), RCA OK, LIMA-LAD atretic, SVG-OM 100% (chronic), EF 45%, pt req defib x 13 for VF/VT arrest, CHB w/ tem pacer, IABP and intubation  . LUMBAR LAMINECTOMY  [id number]    X2  . PROSTATECTOMY  05/2008  Dr. Alinda Money and XRT  . TEE WITHOUT CARDIOVERSION N/A 05/28/2014   Procedure: TRANSESOPHAGEAL ECHOCARDIOGRAM (TEE);  Surgeon: Grace Isaac, MD;  Location: [place];  Service: Open Heart Surgery;  Laterality: N/A;  . TEE WITHOUT CARDIOVERSION N/A 06/24/2014   Procedure: TRANSESOPHAGEAL ECHOCARDIOGRAM (TEE);  Surgeon: Pixie Casino, MD;  [facility];  Service: Cardiovascular;  Laterality: N/A;  . TONSILLECTOMY      Current Outpatient Prescriptions    Medication Sig Dispense Refill  . atorvastatin (LIPITOR) 40 MG tablet TAKE 1 TABLET AT BEDTIME 90 tablet 1  . clopidogrel (PLAVIX) 75 MG tablet TAKE 1 TABLET DAILY 90 tablet 2  . ELIQUIS 5 MG TABS tablet TAKE 1 TABLET TWICE A DAY 180 tablet 1  . fluticasone (FLONASE) 50 MCG/ACT nasal spray USE ONE SPRAY INTO BOTH NOSTRILS AS NEEDED FOR ALLERGIES 48 g 0  . folic acid (FOLVITE) 1 MG tablet TAKE 1 TABLET DAILY 90 tablet 3  . furosemide (LASIX) 20 MG tablet TAKE 1 TABLET EVERY OTHER DAY 90 tablet 1  . halobetasol (ULTRAVATE) 0.05 % ointment Apply topically 2 (two) times daily. 100 g 3  . KLOR-CON 8 MEQ tablet TAKE 1 TABLET DAILY 90 tablet 2  . loratadine (CLARITIN) 10 MG tablet Take 10 mg by mouth daily.    . metoprolol (LOPRESSOR) 50 MG tablet Take 1 tablet (50 mg total) by mouth 2 (two) times daily. 180 tablet 1  . nitroGLYCERIN (NITROSTAT) 0.4 MG SL tablet Place 0.4 mg under the tongue as needed. Chest pain    . PACERONE 200 MG tablet TAKE 1 TABLET DAILY 90 tablet 1  . pantoprazole (PROTONIX) 40 MG tablet TAKE 1 TABLET DAILY 90 tablet 3  . traMADol (ULTRAM) 50 MG tablet Take 50 mg by mouth every 6 (six) hours as needed. Pain    . zolpidem (AMBIEN) 5 MG tablet Take 1 tablet (5 mg total) by mouth at bedtime. 30 tablet 3   Current Facility-Administered Medications  Medication Dose Route Frequency Provider Last Rate Last Dose  . methylPREDNISolone acetate (DEPO-MEDROL) injection 20 mg  20 mg Intra-articular Once Plotnikov, Evie Lacks, MD        Allergies  Allergen Reactions  . Adhesive [Tape] Other (See Comments)    Takes skin off   . Codeine Sulfate Itching and Other (See Comments)    headaches    Social History   Social History  . Marital status: Married    Spouse name: N/A  . Number of children: N/A  . Years of education: N/A   Occupational History  . Retired-self employed Retired   Social History Main Topics  . Smoking status: Current Every Day Smoker    Years: 40.00     Types: Cigarettes  . Smokeless tobacco: Never Used  . Alcohol use No  . Drug use: No  . Sexual activity: Yes   Other Topics Concern  . Not on file   Social History Narrative  . No narrative on file    Family History  Problem Relation Age of Onset  . Cancer Mother        Colon  . Heart disease Father   . Coronary artery disease Father   . Cancer Sister   . Heart disease Brother   . Hypertension Unknown     Review of Systems:  As stated in the HPI and otherwise negative.   BP 118/62   Pulse 73   Ht 5\' 11"  (1.803 m)   Wt 182 lb 9.6 oz (  82.8 kg)   SpO2 96%   BMI 25.47 kg/m   Physical Examination: General: Well developed, well nourished, NAD  HEENT: OP clear, mucus membranes moist  SKIN: warm, dry. No rashes. Neuro: No focal deficits  Musculoskeletal: Muscle strength 5/5 all ext  Psychiatric: Mood and affect normal  Neck: No JVD, no carotid bruits, no thyromegaly, no lymphadenopathy.  Lungs:Poor air movement bilaterally. No wheezes, rhonci, crackles Cardiovascular: Irreg irreg No murmurs, gallops or rubs. Abdomen:Soft. Bowel sounds present. Non-tender.  Extremities: No lower extremity edema. Pulses are 1 + in the bilateral DP/PT.  EKG:  EKG is  ordered today. The ekg ordered today demonstrates atrial flutter, rate 70 bpm. LAFB.   Recent Labs: 12/19/2015: TSH 1.82 07/04/2016: ALT 26; BUN 17; Creatinine, Ser 1.24; Potassium 4.6; Sodium 134   Lipid Panel    Component Value Date/Time   CHOL 118 10/26/2014 1409   TRIG 109.0 10/26/2014 1409   TRIG 91 04/22/2006 0854   HDL 40.50 10/26/2014 1409   CHOLHDL 3 10/26/2014 1409   VLDL 21.8 10/26/2014 1409   LDLCALC 56 10/26/2014 1409     Wt Readings from Last 3 Encounters:  08/22/16 182 lb 9.6 oz (82.8 kg)  07/04/16 135 lb 12 oz (61.6 kg)  03/19/16 186 lb (84.4 kg)     Other studies Reviewed: Additional studies/ records that were reviewed today include: . Review of the above records demonstrates:    Assessment  and Plan:   1. CAD without angina: He has no chest pain suggestive of angina. He is s/p bypass February 2016. Will continue Plavix, statin and beta blocker. No ASA since he is on Eliquis.    2. PAD: No leg pain. ABI June 2017 stable.  Repeat ABI now.   3. Atrial fibrillation/flutter: He is in atrial flutter. Rate controlled. Continue d Lopressor. Continue Eliquis. Will not repeat DCCV.     4. Tobacco abuse: He is still smoking. I have asked him to stop. He does not wish to stop smoking.   5. Carotid artery disease: Moderate bilateral carotid artery disease. Last dopplers July 2017. Repeat now.    Current medicines are reviewed at length with the patient today.  The patient does not have concerns regarding medicines.  The following changes have been made:  no change  Labs/ tests ordered today include:   No orders of the defined types were placed in this encounter.   Disposition:   FU with me in 6 months  Signed, Lauree Chandler, MD 08/22/2016 11:29 AM    [facility] [address], [place], [place]  [address] Phone: [phone]; Fax: [phone]

## 2016-08-24 NOTE — Addendum Note (Signed)
Addended by: Mendel Ryder on: 08/24/2016 02:38 PM   Modules accepted: Orders

## 2016-09-10 ENCOUNTER — Telehealth: Payer: Self-pay | Admitting: Internal Medicine

## 2016-09-14 NOTE — Telephone Encounter (Signed)
Called refill into [facility] had to leave on pharmacy vm.../lmb 

## 2016-09-25 NOTE — Telephone Encounter (Signed)
[facility]  Are you okay with sending 90 day supply to mail order?

## 2016-10-01 NOTE — Telephone Encounter (Signed)
OK to fill this/these prescription(s) with additional refills x1  Thank you!  

## 2016-10-02 MED ORDER — ZOLPIDEM TARTRATE 5 MG PO TABS: 5.0000 mg | ORAL_TABLET | Freq: Every day | ORAL | 1 refills | Status: DC

## 2016-10-02 NOTE — Addendum Note (Signed)
Addended by: Aviva Signs M on: 10/02/2016 08:50 AM   Modules accepted: Orders

## 2016-10-02 NOTE — Telephone Encounter (Signed)
Faxed to mail order. 

## 2016-10-04 ENCOUNTER — Ambulatory Visit (INDEPENDENT_AMBULATORY_CARE_PROVIDER_SITE_OTHER): Payer: Medicare Other | Admitting: *Deleted

## 2016-10-04 VITALS — BP 126/80 | HR 60 | Resp 20 | Ht 71.0 in | Wt 183.0 lb

## 2016-10-04 DIAGNOSIS — Z0000 Encounter for general adult medical examination without abnormal findings: Secondary | ICD-10-CM | POA: Diagnosis not present

## 2016-10-04 NOTE — Progress Notes (Signed)
Pre visit review using our clinic review tool, if applicable. No additional management support is needed unless otherwise documented below in the visit note. 

## 2016-10-04 NOTE — Patient Instructions (Signed)
Continue doing brain stimulating activities (puzzles, reading, adult coloring books, staying active) to keep memory sharp.   Continue to eat heart healthy diet (full of fruits, vegetables, whole grains, lean protein, water--limit salt, fat, and sugar intake) and increase physical activity as tolerated.    [name] , Thank you for taking time to come for your Medicare Wellness Visit. I appreciate your ongoing commitment to your health goals. Please review the following plan we discussed and let me know if I can assist you in the future.   These are the goals we discussed: Goals    . Maintain current health status          Be active, travel, enjoy God, life, family       This is a list of the screening recommended for you and due dates:  Health Maintenance  Topic Date Due  . Flu Shot  11/07/2016  . Tetanus Vaccine  07/09/2022  . Pneumonia vaccines  Completed

## 2016-10-04 NOTE — Progress Notes (Addendum)
Subjective:   [name] is a 76 y.o. male who presents for Medicare Annual/Subsequent preventive examination.  Review of Systems:  No ROS.  Medicare Wellness Visit. Additional risk factors are reflected in the social history.  Cardiac Risk Factors include: advanced age (>49men, >28 women);smoking/ tobacco exposure;hypertension;male gender;dyslipidemia  Sleep patterns: feels rested on waking, gets up 1-2 times nightly to void and sleeps 6-7 hours nightly.    Home Safety/Smoke Alarms: Feels safe in home. Smoke alarms in place.  Living environment; residence and Firearm Safety: [place], firearms stored safely. Lives with wife, no needs for DME, good support system  Seat Belt Safety/Bike Helmet: Wears seat belt.   Counseling:   Eye Exam- appointment yearly, Dr. Susa Simmonds [facility]- appointment every 6 months   Male:   CCS- Last 11/22/09, recall 10 years   PSA-  Lab Results  Component Value Date   PSA 0.01 (L) 02/20/2012   PSA 0.00 (L) 02/16/2010   PSA 2.76 07/11/2006       Objective:    Vitals: BP 126/80   Pulse 60   Resp 20   Ht 5\' 11"  (1.803 m)   Wt 183 lb (83 kg)   SpO2 98%   BMI 25.52 kg/m   Body mass index is 25.52 kg/m.  Tobacco History  Smoking Status  . Current Every Day Smoker  . Years: 40.00  . Types: Cigarettes  Smokeless Tobacco  . Never Used     Ready to quit: Not Answered Counseling given: Not Answered   Past Medical History:  Diagnosis Date  . ALLERGIC RHINITIS   . CAD (coronary artery disease)    CABG x 2 with LIMA to Circumflex and vein graft to PDA  07/13/1991  . Carotid bruit    bilateral  . COPD (chronic obstructive pulmonary disease) ([place])   . Diverticulosis   . History of transient ischemic attack (TIA)   . Hyperlipidemia   . Osteoarthritis   . Peripheral neuropathy   . Personal history of prostate cancer   . PVD (peripheral vascular disease) with claudication ([place])   . STEMI (ST elevation myocardial  infarction) ([place]) 04/29/2014   PTCA Lmain and CFX, in setting of VF/VT arrest and CGS  . Tobacco use disorder, continuous   . Ventral hernia    Past Surgical History:  Procedure Laterality Date  . APPENDECTOMY    . CARDIOVERSION N/A 06/24/2014   Procedure: CARDIOVERSION;  Surgeon: Pixie Casino, MD;  Location: [place];  Service: Cardiovascular;  Laterality: N/A;  . CORONARY ARTERY BYPASS GRAFT  1992  . CORONARY ARTERY BYPASS GRAFT N/A 05/28/2014   Procedure: REDO CORONARY ARTERY BYPASS GRAFTING (CABG);  Surgeon: Grace Isaac, MD;  Location: [place];  Service: Open Heart Surgery;  Laterality: N/A;  Times 4 using right internal mammary artery to Intermediate artery and endoscopically harvested left saphenous vein to LAD, OM1, and PD coronary arteries.  Marland Kitchen LEFT HEART CATHETERIZATION WITH CORONARY ANGIOGRAM N/A 04/30/2014   Procedure: LEFT HEART CATHETERIZATION WITH CORONARY ANGIOGRAM;  Surgeon: Peter M Martinique, MD; Lmain 90% (s/p PTCA), LAD 80%, CFX 100% (s/p PTCA), RCA OK, LIMA-LAD atretic, SVG-OM 100% (chronic), EF 45%, pt req defib x 13 for VF/VT arrest, CHB w/ tem pacer, IABP and intubation  . LUMBAR LAMINECTOMY  [id number]    X2  . PROSTATECTOMY  05/2008   Dr. Alinda Money and XRT  . TEE WITHOUT CARDIOVERSION N/A 05/28/2014   Procedure: TRANSESOPHAGEAL ECHOCARDIOGRAM (TEE);  Surgeon: Grace Isaac, MD;  Location:  [place] [facility];  Service: Open Heart Surgery;  Laterality: N/A;  . TEE WITHOUT CARDIOVERSION N/A 06/24/2014   Procedure: TRANSESOPHAGEAL ECHOCARDIOGRAM (TEE);  Surgeon: Pixie Casino, MD;  Location: [facility] ENDOSCOPY;  Service: Cardiovascular;  Laterality: N/A;  . TONSILLECTOMY     Family History  Problem Relation Age of Onset  . Cancer Mother        Colon  . Heart disease Father   . Coronary artery disease Father   . Cancer Sister   . Heart disease Brother   . Hypertension Unknown    History  Sexual Activity  . Sexual activity: Yes    Outpatient Encounter Prescriptions as of  10/04/2016  Medication Sig  . atorvastatin (LIPITOR) 40 MG tablet TAKE 1 TABLET AT BEDTIME  . clopidogrel (PLAVIX) 75 MG tablet TAKE 1 TABLET DAILY  . ELIQUIS 5 MG TABS tablet TAKE 1 TABLET TWICE A DAY  . fluticasone (FLONASE) 50 MCG/ACT nasal spray USE ONE SPRAY INTO BOTH NOSTRILS AS NEEDED FOR ALLERGIES  . folic acid (FOLVITE) 1 MG tablet TAKE 1 TABLET DAILY  . furosemide (LASIX) 20 MG tablet TAKE 1 TABLET EVERY OTHER DAY  . halobetasol (ULTRAVATE) 0.05 % ointment Apply topically 2 (two) times daily.  Marland Kitchen KLOR-CON 8 MEQ tablet TAKE 1 TABLET DAILY  . loratadine (CLARITIN) 10 MG tablet Take 10 mg by mouth daily.  . metoprolol (LOPRESSOR) 50 MG tablet Take 1 tablet (50 mg total) by mouth 2 (two) times daily.  . nitroGLYCERIN (NITROSTAT) 0.4 MG SL tablet Place 0.4 mg under the tongue as needed. Chest pain  . pantoprazole (PROTONIX) 40 MG tablet TAKE 1 TABLET DAILY  . traMADol (ULTRAM) 50 MG tablet Take 50 mg by mouth every 6 (six) hours as needed. Pain  . zolpidem (AMBIEN) 5 MG tablet Take 1 tablet (5 mg total) by mouth at bedtime.   Facility-Administered Encounter Medications as of 10/04/2016  Medication  . methylPREDNISolone acetate (DEPO-MEDROL) injection 20 mg    Activities of Daily Living In your present state of health, do you have any difficulty performing the following activities: 10/04/2016  Hearing? N  Vision? N  Difficulty concentrating or making decisions? N  Walking or climbing stairs? Y  Dressing or bathing? N  Doing errands, shopping? N  Preparing Food and eating ? N  Using the Toilet? N  In the past six months, have you accidently leaked urine? N  Do you have problems with loss of bowel control? N  Managing your Medications? N  Managing your Finances? N  Housekeeping or managing your Housekeeping? N  Some recent data might be hidden    Patient Care Team: Plotnikov, Evie Lacks, MD as PCP - Starleen Arms, MD as Consulting Physician (Urology) Burnell Blanks, MD as Consulting Physician (Cardiology)   Assessment:    Physical assessment deferred to PCP.  Exercise Activities and Dietary recommendations Current Exercise Habits: Home exercise routine, Type of exercise: calisthenics;walking, Time (Minutes): 30, Frequency (Times/Week): 3, Weekly Exercise (Minutes/Week): 90, Intensity: Mild, Exercise limited by: None identified Diet (meal preparation, eat out, water intake, caffeinated beverages, dairy products, fruits and vegetables): in general, a "healthy" diet  , well balanced eats a variety of fruits and vegetables daily, limits salt, fat/cholesterol, sugar, caffeine, drinks 6-8 glasses of water daily.   Goals    . Maintain current health status          Be active, travel, enjoy God, life, family      Fall Risk Fall Risk  10/04/2016 09/12/2015 07/19/2014  Falls in the past year? Yes Yes Yes  Number falls in past yr: 1 1 2  or more  Injury with Fall? No Yes Yes  Risk for fall due to : Impaired mobility - -  Follow up Falls prevention discussed - -   Depression Screen PHQ 2/9 Scores 10/04/2016 09/12/2015 01/26/2015 09/27/2014  PHQ - 2 Score 0 0 0 0    Cognitive Function MMSE - Mini Mental State Exam 10/04/2016  Orientation to time 5  Orientation to Place 5  Registration 3  Attention/ Calculation 5  Recall 3  Language- name 2 objects 2  Language- repeat 1  Language- follow 3 step command 3  Language- read & follow direction 1  Write a sentence 1  Copy design 1  Total score 30        Immunization History  Administered Date(s) Administered  . H1N1 03/16/2008  . Influenza Split 02/20/2012  . Influenza Whole 01/07/2008, 02/05/2008, 02/01/2009, 01/25/2010  . Influenza, High Dose Seasonal PF 12/19/2015  . Influenza,inj,Quad PF,36+ Mos 12/24/2012, 01/11/2014, 02/08/2015  . Pneumococcal Conjugate-13 04/14/2014  . Pneumococcal Polysaccharide-23 04/23/2005, 03/19/2016  . Td 02/20/2010  . Tdap 07/08/2012   Screening  Tests Health Maintenance  Topic Date Due  . INFLUENZA VACCINE  11/07/2016  . TETANUS/TDAP  07/09/2022  . PNA vac Low Risk Adult  Completed      Plan:    Continue doing brain stimulating activities (puzzles, reading, adult coloring books, staying active) to keep memory sharp.   Continue to eat heart healthy diet (full of fruits, vegetables, whole grains, lean protein, water--limit salt, fat, and sugar intake) and increase physical activity as tolerated.    I have personally reviewed and noted the following in the patient's chart:   . Medical and social history . Use of alcohol, tobacco or illicit drugs  . Current medications and supplements . Functional ability and status . Nutritional status . Physical activity . Advanced directives . List of other physicians . Vitals . Screenings to include cognitive, depression, and falls . Referrals and appointments  In addition, I have reviewed and discussed with patient certain preventive protocols, quality metrics, and best practice recommendations. A written personalized care plan for preventive services as well as general preventive health recommendations were provided to patient.     Michiel Cowboy, RN  10/04/2016  Medical screening examination/treatment/procedure(s) were performed by non-physician practitioner and as supervising physician I was immediately available for consultation/collaboration. I agree with above. Walker Kehr, MD

## 2016-10-17 ENCOUNTER — Other Ambulatory Visit: Payer: Self-pay | Admitting: Cardiovascular Disease

## 2016-10-17 ENCOUNTER — Other Ambulatory Visit: Payer: Self-pay | Admitting: Internal Medicine

## 2016-10-17 DIAGNOSIS — I739 Peripheral vascular disease, unspecified: Secondary | ICD-10-CM

## 2016-10-17 MED ORDER — FLUTICASONE PROPIONATE 50 MCG/ACT NA SUSP: 1.0000 | Freq: Every day | NASAL | 0 refills | Status: DC | PRN

## 2016-10-17 NOTE — Addendum Note (Signed)
Addended by: Earnstine Regal on: 10/17/2016 10:51 AM   Modules accepted: Orders

## 2016-10-17 NOTE — Telephone Encounter (Signed)
[facility] rx [facility]...[name]

## 2016-10-25 ENCOUNTER — Ambulatory Visit (HOSPITAL_COMMUNITY)
Admission: RE | Admit: 2016-10-25 | Discharge: 2016-10-25 | Disposition: A | Discharge status: Home / Self Care | Payer: Medicare Other | Source: Ambulatory Visit | Attending: Cardiology | Admitting: Cardiology

## 2016-10-25 ENCOUNTER — Ambulatory Visit (HOSPITAL_COMMUNITY)
Admission: RE | Admit: 2016-10-25 | Discharge: 2016-10-25 | Disposition: A | Discharge status: Home / Self Care | Payer: Medicare Other | Source: Ambulatory Visit | Attending: Cardiovascular Disease | Admitting: Cardiovascular Disease

## 2016-10-25 DIAGNOSIS — I6523 Occlusion and stenosis of bilateral carotid arteries: Secondary | ICD-10-CM | POA: Diagnosis not present

## 2016-10-25 DIAGNOSIS — I739 Peripheral vascular disease, unspecified: Secondary | ICD-10-CM

## 2016-10-25 DIAGNOSIS — I779 Disorder of arteries and arterioles, unspecified: Secondary | ICD-10-CM

## 2016-11-06 ENCOUNTER — Ambulatory Visit: Payer: Medicare Other | Admitting: Internal Medicine

## 2016-11-07 ENCOUNTER — Encounter: Payer: Self-pay | Admitting: Internal Medicine

## 2016-11-07 ENCOUNTER — Ambulatory Visit (INDEPENDENT_AMBULATORY_CARE_PROVIDER_SITE_OTHER): Payer: Medicare Other | Admitting: Internal Medicine

## 2016-11-07 DIAGNOSIS — I480 Paroxysmal atrial fibrillation: Secondary | ICD-10-CM | POA: Diagnosis not present

## 2016-11-07 DIAGNOSIS — I2510 Atherosclerotic heart disease of native coronary artery without angina pectoris: Secondary | ICD-10-CM | POA: Diagnosis not present

## 2016-11-07 DIAGNOSIS — G5793 Unspecified mononeuropathy of bilateral lower limbs: Secondary | ICD-10-CM

## 2016-11-07 DIAGNOSIS — Z9861 Coronary angioplasty status: Secondary | ICD-10-CM

## 2016-11-07 DIAGNOSIS — F5101 Primary insomnia: Secondary | ICD-10-CM | POA: Diagnosis not present

## 2016-11-07 DIAGNOSIS — G5791 Unspecified mononeuropathy of right lower limb: Secondary | ICD-10-CM | POA: Diagnosis not present

## 2016-11-07 DIAGNOSIS — G5792 Unspecified mononeuropathy of left lower limb: Secondary | ICD-10-CM

## 2016-11-07 NOTE — Progress Notes (Signed)
Subjective:  Patient ID: [name], male    DOB: 10-29-1940  Age: 76 y.o. MRN: [id number]  CC: No chief complaint on file.   HPI [name] presents for COPD, LBP, PVD f/u  Outpatient Medications Prior to Visit  Medication Sig Dispense Refill  . atorvastatin (LIPITOR) 40 MG tablet TAKE 1 TABLET AT BEDTIME 90 tablet 1  . clopidogrel (PLAVIX) 75 MG tablet TAKE 1 TABLET DAILY 90 tablet 2  . ELIQUIS 5 MG TABS tablet TAKE 1 TABLET TWICE A DAY 180 tablet 1  . fluticasone (FLONASE) 50 MCG/ACT nasal spray Place 1 spray into both nostrils daily as needed for allergies or rhinitis. Annual appt w/labs is due in Sept must make appt for future refills 48 g 0  . folic acid (FOLVITE) 1 MG tablet TAKE 1 TABLET DAILY 90 tablet 3  . furosemide (LASIX) 20 MG tablet TAKE 1 TABLET EVERY OTHER DAY 90 tablet 1  . halobetasol (ULTRAVATE) 0.05 % ointment Apply topically 2 (two) times daily. 100 g 3  . KLOR-CON 8 MEQ tablet TAKE 1 TABLET DAILY 90 tablet 2  . loratadine (CLARITIN) 10 MG tablet Take 10 mg by mouth daily.    . metoprolol (LOPRESSOR) 50 MG tablet Take 1 tablet (50 mg total) by mouth 2 (two) times daily. 180 tablet 1  . nitroGLYCERIN (NITROSTAT) 0.4 MG SL tablet Place 0.4 mg under the tongue as needed. Chest pain    . pantoprazole (PROTONIX) 40 MG tablet TAKE 1 TABLET DAILY 90 tablet 3  . traMADol (ULTRAM) 50 MG tablet Take 50 mg by mouth every 6 (six) hours as needed. Pain    . zolpidem (AMBIEN) 5 MG tablet Take 1 tablet (5 mg total) by mouth at bedtime. 90 tablet 1   Facility-Administered Medications Prior to Visit  Medication Dose Route Frequency Provider Last Rate Last Dose  . methylPREDNISolone acetate (DEPO-MEDROL) injection 20 mg  20 mg Intra-articular Once Macai Sisneros, Evie Lacks, MD        ROS Review of Systems  Constitutional: Negative for appetite change, fatigue and unexpected weight change.  HENT: Negative for congestion, nosebleeds, sneezing, sore throat and  trouble swallowing.   Eyes: Negative for itching and visual disturbance.  Respiratory: Negative for cough.   Cardiovascular: Negative for chest pain, palpitations and leg swelling.  Gastrointestinal: Negative for abdominal distention, blood in stool, diarrhea and nausea.  Genitourinary: Negative for frequency and hematuria.  Musculoskeletal: Positive for back pain and gait problem. Negative for joint swelling and neck pain.  Skin: Negative for rash.  Neurological: Negative for dizziness, tremors, speech difficulty and weakness.  Psychiatric/Behavioral: Negative for agitation, dysphoric mood and sleep disturbance. The patient is not nervous/anxious.     Objective:  BP (!) 112/58 (BP Location: Left Arm, Patient Position: Sitting, Cuff Size: Normal)   Pulse (!) 45   Temp 97.9 F (36.6 C) (Oral)   Ht 5\' 11"  (1.803 m)   Wt 182 lb (82.6 kg)   SpO2 98%   BMI 25.38 kg/m   BP Readings from Last 3 Encounters:  11/07/16 (!) 112/58  10/04/16 126/80  08/22/16 118/62    Wt Readings from Last 3 Encounters:  11/07/16 182 lb (82.6 kg)  10/04/16 183 lb (83 kg)  08/22/16 182 lb 9.6 oz (82.8 kg)    Physical Exam  Constitutional: He is oriented to person, place, and time. He appears well-developed. No distress.  NAD  HENT:  Mouth/Throat: Oropharynx is clear and moist.  Eyes: Pupils are  equal, round, and reactive to light. Conjunctivae are normal.  Neck: Normal range of motion. No JVD present. No thyromegaly present.  Cardiovascular: Normal rate, regular rhythm, normal heart sounds and intact distal pulses.  Exam reveals no gallop and no friction rub.   No murmur heard. Pulmonary/Chest: Effort normal and breath sounds normal. No respiratory distress. He has no wheezes. He has no rales. He exhibits no tenderness.  Abdominal: Soft. Bowel sounds are normal. He exhibits no distension and no mass. There is no tenderness. There is no rebound and no guarding.  Musculoskeletal: Normal range of  motion. He exhibits no edema or tenderness.  Lymphadenopathy:    He has no cervical adenopathy.  Neurological: He is alert and oriented to person, place, and time. He has normal reflexes. No cranial nerve deficit. He exhibits normal muscle tone. He displays a negative Romberg sign. Coordination and gait normal.  Skin: Skin is warm and dry. No rash noted.  Psychiatric: He has a normal mood and affect. His behavior is normal. Judgment and thought content normal.  walker  Lab Results  Component Value Date   WBC 7.6 10/26/2014   HGB 14.2 10/26/2014   HCT 42.2 10/26/2014   PLT 229.0 10/26/2014   GLUCOSE 115 (H) 07/04/2016   CHOL 118 10/26/2014   TRIG 109.0 10/26/2014   HDL 40.50 10/26/2014   LDLCALC 56 10/26/2014   ALT 26 07/04/2016   AST 22 07/04/2016   NA 134 (L) 07/04/2016   K 4.6 07/04/2016   CL 101 07/04/2016   CREATININE 1.24 07/04/2016   BUN 17 07/04/2016   CO2 27 07/04/2016   TSH 1.82 12/19/2015   PSA 0.01 (L) 02/20/2012   INR 1.30 05/28/2014   HGBA1C 5.7 07/11/2006    No results found.  Assessment & Plan:   There are no diagnoses linked to this encounter. I am having [name] maintain his nitroGLYCERIN, loratadine, traMADol, halobetasol, clopidogrel, metoprolol tartrate, KLOR-CON, ELIQUIS, furosemide, atorvastatin, pantoprazole, folic acid, zolpidem, and fluticasone. We will continue to administer methylPREDNISolone acetate.  No orders of the defined types were placed in this encounter.    Follow-up: No Follow-up on file.  Walker Kehr, MD

## 2016-11-07 NOTE — Assessment & Plan Note (Signed)
On Eliquis, Amiodarone 

## 2016-11-07 NOTE — Assessment & Plan Note (Signed)
Zolpidem prn  Potential benefits of a long term benzodiazepines  use as well as potential risks  and complications were explained to the patient and were aknowledged. 

## 2016-11-07 NOTE — Assessment & Plan Note (Signed)
using a walker or a cane

## 2016-12-08 ENCOUNTER — Other Ambulatory Visit: Payer: Self-pay | Admitting: Cardiovascular Disease

## 2016-12-22 ENCOUNTER — Other Ambulatory Visit: Payer: Self-pay | Admitting: Internal Medicine

## 2016-12-26 ENCOUNTER — Encounter: Payer: Self-pay | Admitting: Gastroenterology

## 2016-12-29 ENCOUNTER — Other Ambulatory Visit: Payer: Self-pay | Admitting: Cardiovascular Disease

## 2017-01-10 ENCOUNTER — Telehealth: Payer: Self-pay | Admitting: Internal Medicine

## 2017-01-10 ENCOUNTER — Ambulatory Visit (INDEPENDENT_AMBULATORY_CARE_PROVIDER_SITE_OTHER): Payer: Medicare Other | Admitting: *Deleted

## 2017-01-10 DIAGNOSIS — Z23 Encounter for immunization: Secondary | ICD-10-CM | POA: Diagnosis not present

## 2017-01-10 DIAGNOSIS — D485 Neoplasm of uncertain behavior of skin: Secondary | ICD-10-CM

## 2017-01-10 NOTE — Telephone Encounter (Signed)
Patient is requesting a referral to see a dermatologist. Please advise. He has an appointment with Dr.Plot on Nov 7.

## 2017-01-11 NOTE — Telephone Encounter (Signed)
Are you okay with referral prior to appt?

## 2017-01-14 NOTE — Telephone Encounter (Signed)
OK. Thx

## 2017-01-15 ENCOUNTER — Encounter: Payer: Self-pay | Admitting: Gastroenterology

## 2017-01-17 ENCOUNTER — Encounter: Payer: Self-pay | Admitting: Internal Medicine

## 2017-01-17 ENCOUNTER — Telehealth: Payer: Self-pay | Admitting: Internal Medicine

## 2017-01-19 ENCOUNTER — Other Ambulatory Visit: Payer: Self-pay | Admitting: Cardiovascular Disease

## 2017-02-04 MED ORDER — FLUTICASONE PROPIONATE 50 MCG/ACT NA SUSP: 1.0000 | Freq: Every day | NASAL | 0 refills | Status: DC | PRN

## 2017-02-04 NOTE — Telephone Encounter (Signed)
Pt called stating that [facility] did not receive this refill. Can this be resent?

## 2017-02-04 NOTE — Telephone Encounter (Signed)
RX sent

## 2017-02-04 NOTE — Addendum Note (Signed)
Addended by: Karren Cobble on: 02/04/2017 11:42 AM   Modules accepted: Orders

## 2017-02-07 ENCOUNTER — Ambulatory Visit (INDEPENDENT_AMBULATORY_CARE_PROVIDER_SITE_OTHER): Payer: Medicare Other | Admitting: Gastroenterology

## 2017-02-07 ENCOUNTER — Encounter: Payer: Self-pay | Admitting: Gastroenterology

## 2017-02-07 VITALS — BP 124/50 | HR 60 | Ht 69.25 in | Wt 177.4 lb

## 2017-02-07 DIAGNOSIS — Z7901 Long term (current) use of anticoagulants: Secondary | ICD-10-CM

## 2017-02-07 DIAGNOSIS — K627 Radiation proctitis: Secondary | ICD-10-CM

## 2017-02-07 DIAGNOSIS — I2510 Atherosclerotic heart disease of native coronary artery without angina pectoris: Secondary | ICD-10-CM | POA: Diagnosis not present

## 2017-02-07 DIAGNOSIS — Z9861 Coronary angioplasty status: Secondary | ICD-10-CM

## 2017-02-07 DIAGNOSIS — Z1211 Encounter for screening for malignant neoplasm of colon: Secondary | ICD-10-CM

## 2017-02-07 NOTE — Patient Instructions (Signed)
Please follow up with Dr. Havery Moros as needed.

## 2017-02-07 NOTE — Progress Notes (Signed)
HPI :  76 y/o male with a history of CAD s/p CABG, COPD, TIA, PVD, radiation proctitis, on eliquis and plavix here for a visit to discuss colonoscopy and his radiation proctitis. Previously seen by Dr. Olevia Perches, he has not been seen since 2011.  He had history of prostate cancer status post therapy with surgery and radiation in 2010.  He had a colonoscopy last in August 2011, showing radiation proctitis of the rectum and sigmoid colon without any polyps noted. Prep was noted to be excellent. He reports he may have had symptoms from that remotely but currently denies any issues with his bowel movements. He reports on average 1 normally formed bowel movement per day. He denies any diarrhea or urgency with his stools. He denies any blood in his stools. He has some hemorrhoids which occasionally bother him with irritation. He has no history of anemia. He denies any family history of colon cancer. He denies any chest pain or shortness of breath, generally he feels well other than chronic low back pain. He continues to smoke cigarettes routinely.  Colonoscopy 11/22/2009 - evidence of radiation proctitis - path with mildly active colitis without chronicicity, mild diverticulosis, internal hemorrhoids - EXCELLENT PREP Colonoscopy 08/30/2004 -diverticulosis, no polyps   Echo 06/24/2014 - EF 45-50%   Past Medical History:  Diagnosis Date  . ALLERGIC RHINITIS   . CAD (coronary artery disease)    CABG x 2 with LIMA to Circumflex and vein graft to PDA  07/13/1991  . Carotid bruit    bilateral  . COPD (chronic obstructive pulmonary disease) ([place])   . Diverticulosis   . History of transient ischemic attack (TIA)   . Hyperlipidemia   . Osteoarthritis   . Peripheral neuropathy   . Personal history of prostate cancer   . PVD (peripheral vascular disease) with claudication ([place])   . Radiation proctitis   . STEMI (ST elevation myocardial infarction) ([place]) 04/29/2014   PTCA Lmain and CFX, in setting of VF/VT  arrest and CGS  . Tobacco use disorder, continuous   . Ventral hernia      Past Surgical History:  Procedure Laterality Date  . APPENDECTOMY    . CARDIOVERSION N/A 06/24/2014   Procedure: CARDIOVERSION;  Surgeon: Pixie Casino, MD;  Location: [facility];  Service: Cardiovascular;  Laterality: N/A;  . CORONARY ARTERY BYPASS GRAFT  1992  . CORONARY ARTERY BYPASS GRAFT N/A 05/28/2014   Procedure: REDO CORONARY ARTERY BYPASS GRAFTING (CABG);  Surgeon: Grace Isaac, MD;  Location: [facility];  Service: Open Heart Surgery;  Laterality: N/A;  Times 4 using right internal mammary artery to Intermediate artery and endoscopically harvested left saphenous vein to LAD, OM1, and PD coronary arteries.  Marland Kitchen LEFT HEART CATHETERIZATION WITH CORONARY ANGIOGRAM N/A 04/30/2014   Procedure: LEFT HEART CATHETERIZATION WITH CORONARY ANGIOGRAM;  Surgeon: Peter M Martinique, MD; Lmain 90% (s/p PTCA), LAD 80%, CFX 100% (s/p PTCA), RCA OK, LIMA-LAD atretic, SVG-OM 100% (chronic), EF 45%, pt req defib x 13 for VF/VT arrest, CHB w/ tem pacer, IABP and intubation  . LUMBAR LAMINECTOMY  [id number]    X2  . PROSTATECTOMY  05/2008   Dr. Alinda Money and XRT  . TEE WITHOUT CARDIOVERSION N/A 05/28/2014   Procedure: TRANSESOPHAGEAL ECHOCARDIOGRAM (TEE);  Surgeon: Grace Isaac, MD;  Location: [facility];  Service: Open Heart Surgery;  Laterality: N/A;  . TEE WITHOUT CARDIOVERSION N/A 06/24/2014   Procedure: TRANSESOPHAGEAL ECHOCARDIOGRAM (TEE);  Surgeon: Pixie Casino, MD;  Location: [facility] ENDOSCOPY;  Service: Cardiovascular;  Laterality: N/A;  . TONSILLECTOMY     Family History  Problem Relation Age of Onset  . Heart disease Father   . Coronary artery disease Father   . Lung cancer Sister   . Heart disease Brother   . Hypertension Unknown    Social History  Substance Use Topics  . Smoking status: Current Every Day Smoker    Years: 40.00    Types: Cigarettes  . Smokeless tobacco: Never Used  . Alcohol use No   Current  Outpatient Prescriptions  Medication Sig Dispense Refill  . atorvastatin (LIPITOR) 40 MG tablet TAKE 1 TABLET AT BEDTIME 90 tablet 3  . clopidogrel (PLAVIX) 75 MG tablet Take 1 tablet (75 mg total) by mouth daily. 90 tablet 1  . ELIQUIS 5 MG TABS tablet TAKE 1 TABLET TWICE A DAY 180 tablet 3  . fluticasone (FLONASE) 50 MCG/ACT nasal spray Place 1 spray into both nostrils daily as needed for allergies or rhinitis. 48 g 0  . folic acid (FOLVITE) 1 MG tablet TAKE 1 TABLET DAILY 90 tablet 3  . furosemide (LASIX) 20 MG tablet TAKE 1 TABLET EVERY OTHER DAY 90 tablet 1  . halobetasol (ULTRAVATE) 0.05 % ointment Apply topically 2 (two) times daily. 100 g 3  . KLOR-CON 8 MEQ tablet TAKE 1 TABLET DAILY 90 tablet 2  . loratadine (CLARITIN) 10 MG tablet Take 10 mg by mouth daily.    . metoprolol tartrate (LOPRESSOR) 50 MG tablet Take 1 tablet (50 mg total) by mouth 2 (two) times daily. 180 tablet 1  . nitroGLYCERIN (NITROSTAT) 0.4 MG SL tablet Place 0.4 mg under the tongue as needed. Chest pain    . pantoprazole (PROTONIX) 40 MG tablet TAKE 1 TABLET DAILY 90 tablet 3  . traMADol (ULTRAM) 50 MG tablet Take 50 mg by mouth every 6 (six) hours as needed. Pain    . zolpidem (AMBIEN) 5 MG tablet Take 1 tablet (5 mg total) by mouth at bedtime. 90 tablet 1   No current facility-administered medications for this visit.    Allergies  Allergen Reactions  . Adhesive [Tape] Other (See Comments)    Takes skin off   . Codeine Sulfate Itching and Other (See Comments)    headaches     Review of Systems: All systems reviewed and negative except where noted in HPI.   Lab Results  Component Value Date   WBC 7.6 10/26/2014   HGB 14.2 10/26/2014   HCT 42.2 10/26/2014   MCV 98.1 10/26/2014   PLT 229.0 10/26/2014    Lab Results  Component Value Date   CREATININE 1.24 07/04/2016   BUN 17 07/04/2016   NA 134 (L) 07/04/2016   K 4.6 07/04/2016   CL 101 07/04/2016   CO2 27 07/04/2016    Lab Results    Component Value Date   ALT 26 07/04/2016   AST 22 07/04/2016   ALKPHOS 73 07/04/2016   BILITOT 0.6 07/04/2016     Physical Exam: BP (!) 124/50 (BP Location: Left Arm, Patient Position: Sitting, Cuff Size: Normal)   Pulse 60   Ht 5' 9.25" (1.759 m) Comment: height measured without shoes  Wt 177 lb 6 oz (80.5 kg)   BMI 26.00 kg/m  Constitutional: Pleasant, male in no acute distress. HEENT: Normocephalic and atraumatic. Conjunctivae are normal. No scleral icterus. Neck supple.  Cardiovascular: Normal rate, regular rhythm.  Pulmonary/chest: Effort normal and some decreased BS B Abdominal: Soft, nondistended, nontender. There are no masses  palpable. Extremities: no edema Lymphadenopathy: No cervical adenopathy noted. Neurological: Alert and oriented to person place and time. Skin: Skin is warm and dry. No rashes noted. Psychiatric: Normal mood and affect. Behavior is normal.   ASSESSMENT AND PLAN: 76 year old male with a history of CAD s/p CABG, PVD, COPD - on Plavix and Eliquis, here to discuss if he needs any further colonoscopies and history of radiation proctitis.  History of radiation proctitis - asymptomatic, he has normal bowel habits. He does not warrant any therapy for this at present time. Should he have any issues with diarrhea or blood in his stools moving forward she should contact me for assessment, would consider sucralfate or hydrocortisone enema therapy. He agreed.  Colon cancer screening - last colonoscopy in 2011 without polyps, he would not be due for another colonoscopy until 2021, at which point in time he'll be 76 years old. Given his age and comorbidities with multiple anticoagulants, I do not think he warrants any further colon cancer screening, as risks probably outweigh benefits in this setting. We discussed this issue for bit but he agreed with this plan.   Anticoagulated - otherwise he is on multiple anticoagulants, I agree with continuing Protonix for GI  prophylaxis and he should avoid NSAIDs if possible.   He can contact me in the future with any questions / concerns.   Windsor Cellar, MD [facility] Pager [phone]   CC: Plotnikov, Evie Lacks, MD

## 2017-02-13 ENCOUNTER — Encounter: Payer: Self-pay | Admitting: Internal Medicine

## 2017-02-13 ENCOUNTER — Ambulatory Visit (INDEPENDENT_AMBULATORY_CARE_PROVIDER_SITE_OTHER): Payer: Medicare Other | Admitting: Internal Medicine

## 2017-02-13 VITALS — BP 124/58 | HR 51 | Temp 98.1°F | Ht 71.0 in | Wt 177.0 lb

## 2017-02-13 DIAGNOSIS — I4892 Unspecified atrial flutter: Secondary | ICD-10-CM

## 2017-02-13 DIAGNOSIS — Z9861 Coronary angioplasty status: Secondary | ICD-10-CM | POA: Diagnosis not present

## 2017-02-13 DIAGNOSIS — E785 Hyperlipidemia, unspecified: Secondary | ICD-10-CM

## 2017-02-13 DIAGNOSIS — T148XXA Other injury of unspecified body region, initial encounter: Secondary | ICD-10-CM

## 2017-02-13 DIAGNOSIS — J410 Simple chronic bronchitis: Secondary | ICD-10-CM | POA: Diagnosis not present

## 2017-02-13 DIAGNOSIS — I2510 Atherosclerotic heart disease of native coronary artery without angina pectoris: Secondary | ICD-10-CM | POA: Diagnosis not present

## 2017-02-13 DIAGNOSIS — C61 Malignant neoplasm of prostate: Secondary | ICD-10-CM | POA: Diagnosis not present

## 2017-02-13 NOTE — Patient Instructions (Signed)
Shingrix vaccine

## 2017-02-13 NOTE — Assessment & Plan Note (Signed)
On Eliquis, Amiodarone

## 2017-02-13 NOTE — Assessment & Plan Note (Signed)
Smoker 

## 2017-02-13 NOTE — Progress Notes (Signed)
Subjective:  Patient ID: [name], male    DOB: [birth year]  Age: 76 y.o. MRN: [id number]  CC: No chief complaint on file.   HPI [name] presents for CAD, LBP, OA Fell 3 weeks ago, hit his L head; no LOC  Outpatient Medications Prior to Visit  Medication Sig Dispense Refill  . atorvastatin (LIPITOR) 40 MG tablet TAKE 1 TABLET AT BEDTIME 90 tablet 3  . clopidogrel (PLAVIX) 75 MG tablet Take 1 tablet (75 mg total) by mouth daily. 90 tablet 1  . ELIQUIS 5 MG TABS tablet TAKE 1 TABLET TWICE A DAY 180 tablet 3  . fluticasone (FLONASE) 50 MCG/ACT nasal spray Place 1 spray into both nostrils daily as needed for allergies or rhinitis. 48 g 0  . folic acid (FOLVITE) 1 MG tablet TAKE 1 TABLET DAILY 90 tablet 3  . furosemide (LASIX) 20 MG tablet TAKE 1 TABLET EVERY OTHER DAY 90 tablet 1  . halobetasol (ULTRAVATE) 0.05 % ointment Apply topically 2 (two) times daily. 100 g 3  . KLOR-CON 8 MEQ tablet TAKE 1 TABLET DAILY 90 tablet 2  . loratadine (CLARITIN) 10 MG tablet Take 10 mg by mouth daily.    . metoprolol tartrate (LOPRESSOR) 50 MG tablet Take 1 tablet (50 mg total) by mouth 2 (two) times daily. 180 tablet 1  . nitroGLYCERIN (NITROSTAT) 0.4 MG SL tablet Place 0.4 mg under the tongue as needed. Chest pain    . pantoprazole (PROTONIX) 40 MG tablet TAKE 1 TABLET DAILY 90 tablet 3  . traMADol (ULTRAM) 50 MG tablet Take 50 mg by mouth every 6 (six) hours as needed. Pain    . zolpidem (AMBIEN) 5 MG tablet Take 1 tablet (5 mg total) by mouth at bedtime. 90 tablet 1   No facility-administered medications prior to visit.     ROS Review of Systems  Constitutional: Positive for fatigue. Negative for appetite change and unexpected weight change.  HENT: Positive for ear pain. Negative for congestion, nosebleeds, sneezing, sore throat and trouble swallowing.   Eyes: Negative for itching and visual disturbance.  Respiratory: Negative for cough.   Cardiovascular: Negative for  chest pain, palpitations and leg swelling.  Gastrointestinal: Negative for abdominal distention, blood in stool, diarrhea and nausea.  Genitourinary: Negative for frequency and hematuria.  Musculoskeletal: Positive for arthralgias, back pain and gait problem. Negative for joint swelling and neck pain.  Skin: Negative for rash.  Neurological: Negative for dizziness, tremors, speech difficulty and weakness.  Psychiatric/Behavioral: Negative for agitation, dysphoric mood and sleep disturbance. The patient is not nervous/anxious.   L ear hurts  Objective:  BP (!) 124/58 (BP Location: Left Arm, Patient Position: Sitting, Cuff Size: Normal)   Pulse (!) 51   Temp 98.1 F (36.7 C) (Oral)   Ht 5' 9.25" (1.759 m)   Wt 177 lb (80.3 kg)   SpO2 98%   BMI 25.95 kg/m   BP Readings from Last 3 Encounters:  02/13/17 (!) 124/58  02/07/17 (!) 124/50  11/07/16 (!) 112/58    Wt Readings from Last 3 Encounters:  02/13/17 177 lb (80.3 kg)  02/07/17 177 lb 6 oz (80.5 kg)  11/07/16 182 lb (82.6 kg)    Physical Exam  Constitutional: He is oriented to person, place, and time. He appears well-developed. No distress.  NAD  HENT:  Mouth/Throat: Oropharynx is clear and moist.  Eyes: Conjunctivae are normal. Pupils are equal, round, and reactive to light.  Neck: Normal range of motion. No  JVD present. No thyromegaly present.  Cardiovascular: Normal rate, regular rhythm, normal heart sounds and intact distal pulses. Exam reveals no gallop and no friction rub.  No murmur heard. Pulmonary/Chest: Effort normal and breath sounds normal. No respiratory distress. He has no wheezes. He has no rales. He exhibits no tenderness.  Abdominal: Soft. Bowel sounds are normal. He exhibits no distension and no mass. There is no tenderness. There is no rebound and no guarding.  Musculoskeletal: Normal range of motion. He exhibits tenderness. He exhibits no edema.  Lymphadenopathy:    He has no cervical adenopathy.    Neurological: He is alert and oriented to person, place, and time. He has normal reflexes. No cranial nerve deficit. He exhibits normal muscle tone. He displays a negative Romberg sign. Coordination abnormal. Gait normal.  Skin: Skin is warm and dry. No rash noted.  Psychiatric: He has a normal mood and affect. His behavior is normal. Judgment and thought content normal.  walker  Lab Results  Component Value Date   WBC 7.6 10/26/2014   HGB 14.2 10/26/2014   HCT 42.2 10/26/2014   PLT 229.0 10/26/2014   GLUCOSE 115 (H) 07/04/2016   CHOL 118 10/26/2014   TRIG 109.0 10/26/2014   HDL 40.50 10/26/2014   LDLCALC 56 10/26/2014   ALT 26 07/04/2016   AST 22 07/04/2016   NA 134 (L) 07/04/2016   K 4.6 07/04/2016   CL 101 07/04/2016   CREATININE 1.24 07/04/2016   BUN 17 07/04/2016   CO2 27 07/04/2016   TSH 1.82 12/19/2015   PSA 0.01 (L) 02/20/2012   INR 1.30 05/28/2014   HGBA1C 5.7 07/11/2006    No results found.  Assessment & Plan:   There are no diagnoses linked to this encounter. I am having Tamela Gammon. Fanguy maintain his nitroGLYCERIN, loratadine, traMADol, halobetasol, KLOR-CON, furosemide, pantoprazole, folic acid, zolpidem, metoprolol tartrate, atorvastatin, ELIQUIS, clopidogrel, and fluticasone.  No orders of the defined types were placed in this encounter.    Follow-up: No Follow-up on file.  Walker Kehr, MD

## 2017-02-13 NOTE — Assessment & Plan Note (Signed)
CBC

## 2017-02-13 NOTE — Assessment & Plan Note (Signed)
Plavix, Lipitor, Eliquis 

## 2017-02-13 NOTE — Assessment & Plan Note (Signed)
Dr Alinda Money, PSA w/Urology

## 2017-03-05 DIAGNOSIS — H2513 Age-related nuclear cataract, bilateral: Secondary | ICD-10-CM | POA: Diagnosis not present

## 2017-03-05 DIAGNOSIS — H35371 Puckering of macula, right eye: Secondary | ICD-10-CM | POA: Diagnosis not present

## 2017-03-05 DIAGNOSIS — H35033 Hypertensive retinopathy, bilateral: Secondary | ICD-10-CM | POA: Diagnosis not present

## 2017-03-05 DIAGNOSIS — Z961 Presence of intraocular lens: Secondary | ICD-10-CM | POA: Diagnosis not present

## 2017-03-05 DIAGNOSIS — H33102 Unspecified retinoschisis, left eye: Secondary | ICD-10-CM | POA: Diagnosis not present

## 2017-03-08 ENCOUNTER — Other Ambulatory Visit: Payer: Self-pay | Admitting: Internal Medicine

## 2017-04-10 DIAGNOSIS — L570 Actinic keratosis: Secondary | ICD-10-CM | POA: Diagnosis not present

## 2017-04-10 DIAGNOSIS — L300 Nummular dermatitis: Secondary | ICD-10-CM | POA: Diagnosis not present

## 2017-04-10 DIAGNOSIS — D229 Melanocytic nevi, unspecified: Secondary | ICD-10-CM | POA: Diagnosis not present

## 2017-05-05 ENCOUNTER — Other Ambulatory Visit: Payer: Self-pay | Admitting: Internal Medicine

## 2017-05-14 ENCOUNTER — Other Ambulatory Visit: Payer: Self-pay | Admitting: Dermatology

## 2017-05-14 DIAGNOSIS — L309 Dermatitis, unspecified: Secondary | ICD-10-CM | POA: Diagnosis not present

## 2017-05-14 DIAGNOSIS — L570 Actinic keratosis: Secondary | ICD-10-CM | POA: Diagnosis not present

## 2017-05-14 DIAGNOSIS — D485 Neoplasm of uncertain behavior of skin: Secondary | ICD-10-CM | POA: Diagnosis not present

## 2017-05-15 DIAGNOSIS — Z961 Presence of intraocular lens: Secondary | ICD-10-CM | POA: Diagnosis not present

## 2017-05-15 DIAGNOSIS — H2512 Age-related nuclear cataract, left eye: Secondary | ICD-10-CM | POA: Diagnosis not present

## 2017-05-15 DIAGNOSIS — H35371 Puckering of macula, right eye: Secondary | ICD-10-CM | POA: Diagnosis not present

## 2017-05-16 ENCOUNTER — Ambulatory Visit: Payer: Medicare Other | Admitting: Internal Medicine

## 2017-05-16 ENCOUNTER — Other Ambulatory Visit (INDEPENDENT_AMBULATORY_CARE_PROVIDER_SITE_OTHER): Payer: Medicare Other

## 2017-05-16 ENCOUNTER — Ambulatory Visit (INDEPENDENT_AMBULATORY_CARE_PROVIDER_SITE_OTHER): Payer: Medicare Other | Admitting: Internal Medicine

## 2017-05-16 ENCOUNTER — Encounter: Payer: Self-pay | Admitting: Internal Medicine

## 2017-05-16 DIAGNOSIS — F17209 Nicotine dependence, unspecified, with unspecified nicotine-induced disorders: Secondary | ICD-10-CM

## 2017-05-16 DIAGNOSIS — I110 Hypertensive heart disease with heart failure: Secondary | ICD-10-CM | POA: Diagnosis not present

## 2017-05-16 DIAGNOSIS — I2510 Atherosclerotic heart disease of native coronary artery without angina pectoris: Secondary | ICD-10-CM

## 2017-05-16 DIAGNOSIS — E782 Mixed hyperlipidemia: Secondary | ICD-10-CM

## 2017-05-16 DIAGNOSIS — I4892 Unspecified atrial flutter: Secondary | ICD-10-CM

## 2017-05-16 DIAGNOSIS — I739 Peripheral vascular disease, unspecified: Secondary | ICD-10-CM

## 2017-05-16 DIAGNOSIS — Z9861 Coronary angioplasty status: Secondary | ICD-10-CM

## 2017-05-16 DIAGNOSIS — E785 Hyperlipidemia, unspecified: Secondary | ICD-10-CM | POA: Diagnosis not present

## 2017-05-16 DIAGNOSIS — G8929 Other chronic pain: Secondary | ICD-10-CM | POA: Diagnosis not present

## 2017-05-16 DIAGNOSIS — M5440 Lumbago with sciatica, unspecified side: Secondary | ICD-10-CM

## 2017-05-16 LAB — BASIC METABOLIC PANEL
BUN: 12 mg/dL (ref 6–23)
CALCIUM: 9.3 mg/dL (ref 8.4–10.5)
CO2: 29 mEq/L (ref 19–32)
Chloride: 102 mEq/L (ref 96–112)
Creatinine, Ser: 1.03 mg/dL (ref 0.40–1.50)
GFR: 74.16 mL/min (ref >60.00)
GLUCOSE: 107 mg/dL — AB (ref 70–99)
POTASSIUM: 4.8 meq/L (ref 3.5–5.1)
SODIUM: 136 meq/L (ref 135–145)

## 2017-05-16 LAB — LIPID PANEL
CHOLESTEROL: 130 mg/dL (ref 0–200)
HDL: 36.7 mg/dL — AB (ref >39.00)
LDL Cholesterol: 70 mg/dL (ref 0–99)
NONHDL: 93.57
Total CHOL/HDL Ratio: 4
Triglycerides: 119 mg/dL (ref 0.0–149.0)
VLDL: 23.8 mg/dL (ref 0.0–40.0)

## 2017-05-16 LAB — CBC WITH DIFFERENTIAL/PLATELET
BASOS PCT: 1.4 % (ref 0.0–3.0)
Basophils Absolute: 0.1 10*3/uL (ref 0.0–0.1)
EOS ABS: 1.1 10*3/uL — AB (ref 0.0–0.7)
EOS PCT: 14 % — AB (ref 0.0–5.0)
HCT: 40.4 % (ref 39.0–52.0)
HEMOGLOBIN: 13.9 g/dL (ref 13.0–17.0)
LYMPHS ABS: 1.2 10*3/uL (ref 0.7–4.0)
Lymphocytes Relative: 15.5 % (ref 12.0–46.0)
MCHC: 34.4 g/dL (ref 30.0–36.0)
MCV: 95.4 fl (ref 78.0–100.0)
MONO ABS: 0.7 10*3/uL (ref 0.1–1.0)
Monocytes Relative: 8.4 % (ref 3.0–12.0)
NEUTROS PCT: 60.7 % (ref 43.0–77.0)
Neutro Abs: 4.8 10*3/uL (ref 1.4–7.7)
PLATELETS: 252 10*3/uL (ref 150.0–400.0)
RBC: 4.23 Mil/uL (ref 4.22–5.81)
RDW: 13.3 % (ref 11.5–15.5)
WBC: 7.9 10*3/uL (ref 4.0–10.5)

## 2017-05-16 LAB — URINALYSIS
Bilirubin Urine: NEGATIVE
Ketones, ur: NEGATIVE
Leukocytes, UA: NEGATIVE
Nitrite: NEGATIVE
Specific Gravity, Urine: 1.01
Total Protein, Urine: NEGATIVE
Urine Glucose: NEGATIVE
Urobilinogen, UA: 0.2
pH: 7 (ref 5.0–8.0)

## 2017-05-16 LAB — HEPATIC FUNCTION PANEL
ALBUMIN: 4.2 g/dL (ref 3.5–5.2)
ALK PHOS: 69 U/L (ref 39–117)
ALT: 12 U/L (ref 0–53)
AST: 15 U/L (ref 0–37)
BILIRUBIN TOTAL: 0.6 mg/dL (ref 0.2–1.2)
Bilirubin, Direct: 0.1 mg/dL (ref 0.0–0.3)
Total Protein: 7 g/dL (ref 6.0–8.3)

## 2017-05-16 LAB — TSH: TSH: 0.51 u[IU]/mL (ref 0.35–4.50)

## 2017-05-16 MED ORDER — TRAMADOL HCL 50 MG PO TABS: 50.0000 mg | ORAL_TABLET | Freq: Four times a day (QID) | ORAL | 1 refills | Status: DC | PRN

## 2017-05-16 MED ORDER — METOPROLOL TARTRATE 50 MG PO TABS: 50.0000 mg | ORAL_TABLET | Freq: Two times a day (BID) | ORAL | 3 refills | Status: DC

## 2017-05-16 MED ORDER — APIXABAN 5 MG PO TABS: 5.0000 mg | ORAL_TABLET | Freq: Two times a day (BID) | ORAL | 3 refills | Status: DC

## 2017-05-16 MED ORDER — POTASSIUM CHLORIDE ER 8 MEQ PO TBCR: 8.0000 meq | EXTENDED_RELEASE_TABLET | Freq: Every day | ORAL | 3 refills | Status: DC

## 2017-05-16 MED ORDER — ATORVASTATIN CALCIUM 40 MG PO TABS: 40.0000 mg | ORAL_TABLET | Freq: Every day | ORAL | 3 refills | Status: DC

## 2017-05-16 MED ORDER — ZOLPIDEM TARTRATE 5 MG PO TABS: 5.0000 mg | ORAL_TABLET | Freq: Every day | ORAL | 1 refills | Status: DC

## 2017-05-16 MED ORDER — HALOBETASOL PROPIONATE 0.05 % EX OINT: TOPICAL_OINTMENT | Freq: Two times a day (BID) | CUTANEOUS | 3 refills | Status: DC

## 2017-05-16 MED ORDER — CLOPIDOGREL BISULFATE 75 MG PO TABS: 75.0000 mg | ORAL_TABLET | Freq: Every day | ORAL | 3 refills | Status: DC

## 2017-05-16 MED ORDER — PANTOPRAZOLE SODIUM 40 MG PO TBEC: 40.0000 mg | DELAYED_RELEASE_TABLET | Freq: Every day | ORAL | 3 refills | Status: DC

## 2017-05-16 MED ORDER — FUROSEMIDE 20 MG PO TABS: 20.0000 mg | ORAL_TABLET | ORAL | 3 refills | Status: DC

## 2017-05-16 NOTE — Progress Notes (Signed)
Subjective:  Patient ID: [name], male    DOB: 01-01-1941  Age: 77 y.o. MRN: [id number]  CC: No chief complaint on file.   HPI [name] presents for LBP, COPD, AKs on face, peripheral artery disease  Outpatient Medications Prior to Visit  Medication Sig Dispense Refill  . atorvastatin (LIPITOR) 40 MG tablet TAKE 1 TABLET AT BEDTIME 90 tablet 3  . clopidogrel (PLAVIX) 75 MG tablet Take 1 tablet (75 mg total) by mouth daily. 90 tablet 1  . ELIQUIS 5 MG TABS tablet TAKE 1 TABLET TWICE A DAY 180 tablet 3  . fluticasone (FLONASE) 50 MCG/ACT nasal spray USE ONE SPRAY IN EACH NOSTRIL DAILY AS NEEDED FOR ALLERGIES OR RHINITIS 48 g 0  . folic acid (FOLVITE) 1 MG tablet TAKE 1 TABLET DAILY 90 tablet 3  . furosemide (LASIX) 20 MG tablet TAKE 1 TABLET EVERY OTHER DAY 90 tablet 1  . halobetasol (ULTRAVATE) 0.05 % ointment Apply topically 2 (two) times daily. 100 g 3  . KLOR-CON 8 MEQ tablet TAKE 1 TABLET DAILY 90 tablet 2  . loratadine (CLARITIN) 10 MG tablet Take 10 mg by mouth daily.    . metoprolol tartrate (LOPRESSOR) 50 MG tablet Take 1 tablet (50 mg total) by mouth 2 (two) times daily. 180 tablet 1  . nitroGLYCERIN (NITROSTAT) 0.4 MG SL tablet Place 0.4 mg under the tongue as needed. Chest pain    . pantoprazole (PROTONIX) 40 MG tablet TAKE 1 TABLET DAILY 90 tablet 3  . traMADol (ULTRAM) 50 MG tablet Take 50 mg by mouth every 6 (six) hours as needed. Pain    . zolpidem (AMBIEN) 5 MG tablet Take 1 tablet (5 mg total) by mouth at bedtime. 90 tablet 1   No facility-administered medications prior to visit.     ROS Review of Systems  Constitutional: Positive for fatigue. Negative for appetite change and unexpected weight change.  HENT: Negative for congestion, nosebleeds, sneezing, sore throat and trouble swallowing.   Eyes: Negative for itching and visual disturbance.  Respiratory: Negative for cough.   Cardiovascular: Negative for chest pain, palpitations and leg  swelling.  Gastrointestinal: Negative for abdominal distention, blood in stool, diarrhea and nausea.  Genitourinary: Negative for frequency and hematuria.  Musculoskeletal: Positive for arthralgias, back pain and gait problem. Negative for joint swelling and neck pain.  Skin: Positive for color change. Negative for rash.  Neurological: Negative for dizziness, tremors, speech difficulty and weakness.  Psychiatric/Behavioral: Negative for agitation, dysphoric mood and sleep disturbance. The patient is not nervous/anxious.     Objective:  BP 126/68 (BP Location: Left Arm, Patient Position: Sitting, Cuff Size: Normal)   Pulse (!) 47   Temp 98 F (36.7 C) (Oral)   Ht 5\' 11"  (1.803 m)   Wt 177 lb (80.3 kg)   SpO2 98%   BMI 24.69 kg/m   BP Readings from Last 3 Encounters:  05/16/17 126/68  02/13/17 (!) 124/58  02/07/17 (!) 124/50    Wt Readings from Last 3 Encounters:  05/16/17 177 lb (80.3 kg)  02/13/17 177 lb (80.3 kg)  02/07/17 177 lb 6 oz (80.5 kg)    Physical Exam  Constitutional: He is oriented to person, place, and time. He appears well-developed. No distress.  NAD  HENT:  Mouth/Throat: Oropharynx is clear and moist.  Eyes: Conjunctivae are normal. Pupils are equal, round, and reactive to light.  Neck: Normal range of motion. No JVD present. No thyromegaly present.  Cardiovascular: Normal rate,  regular rhythm, normal heart sounds and intact distal pulses. Exam reveals no gallop and no friction rub.  No murmur heard. Pulmonary/Chest: Effort normal and breath sounds normal. No respiratory distress. He has no wheezes. He has no rales. He exhibits no tenderness.  Abdominal: Soft. Bowel sounds are normal. He exhibits no distension and no mass. There is no tenderness. There is no rebound and no guarding.  Musculoskeletal: Normal range of motion. He exhibits tenderness. He exhibits no edema.  Lymphadenopathy:    He has no cervical adenopathy.  Neurological: He is alert and  oriented to person, place, and time. He has normal reflexes. No cranial nerve deficit. He exhibits normal muscle tone. He displays a negative Romberg sign. Coordination abnormal. Gait normal.  Skin: Skin is warm and dry. No rash noted.  Psychiatric: He has a normal mood and affect. His behavior is normal. Judgment and thought content normal.  healing scabs on face Cane LS tender  Lab Results  Component Value Date   WBC 7.6 10/26/2014   HGB 14.2 10/26/2014   HCT 42.2 10/26/2014   PLT 229.0 10/26/2014   GLUCOSE 115 (H) 07/04/2016   CHOL 118 10/26/2014   TRIG 109.0 10/26/2014   HDL 40.50 10/26/2014   LDLCALC 56 10/26/2014   ALT 26 07/04/2016   AST 22 07/04/2016   NA 134 (L) 07/04/2016   K 4.6 07/04/2016   CL 101 07/04/2016   CREATININE 1.24 07/04/2016   BUN 17 07/04/2016   CO2 27 07/04/2016   TSH 1.82 12/19/2015   PSA 0.01 (L) 02/20/2012   INR 1.30 05/28/2014   HGBA1C 5.7 07/11/2006    No results found.  Assessment & Plan:   There are no diagnoses linked to this encounter. I am having [name] maintain his nitroGLYCERIN, loratadine, traMADol, halobetasol, furosemide, pantoprazole, folic acid, zolpidem, metoprolol tartrate, atorvastatin, ELIQUIS, clopidogrel, KLOR-CON, and fluticasone.  No orders of the defined types were placed in this encounter.    Follow-up: No Follow-up on file.  Walker Kehr, MD

## 2017-05-17 ENCOUNTER — Telehealth: Payer: Self-pay | Admitting: Internal Medicine

## 2017-05-17 NOTE — Telephone Encounter (Signed)
Tried to call [facility] but they were closed. Will try again on Monday.

## 2017-05-17 NOTE — Telephone Encounter (Signed)
Copied from [place]. Topic: Quick Communication - Rx Refill/Question >> May 17, 2017  8:49 AM Scherrie Gerlach wrote: Medication: traMADol (ULTRAM) 50 MG tablet [facility] calling to have Dr Alain Marion call to discuss prescribing this med to the pt. They do not have a history of this drug for the pt and can only fill a 7 day.   [phone] Ref no: [id number] May speak with any pharmacist

## 2017-05-20 NOTE — Assessment & Plan Note (Signed)
Unchanged

## 2017-05-20 NOTE — Assessment & Plan Note (Signed)
Tramadol as needed 

## 2017-05-20 NOTE — Assessment & Plan Note (Signed)
Plavix, Lipitor, Eliquis 

## 2017-05-20 NOTE — Assessment & Plan Note (Signed)
Toprol, Furosemide 

## 2017-05-20 NOTE — Assessment & Plan Note (Signed)
Lipitor p.o.

## 2017-05-21 NOTE — Telephone Encounter (Signed)
Contacted pharmacy. They stated that the can only fill a 7 day supply due the large gap in therapy. Pt may get this filled at a retail pharmacy but would still only get a 7 day supply for the first fill.   Please advise if it is okay to send to a local pharmacy.

## 2017-05-21 NOTE — Telephone Encounter (Signed)
Tried to call pt. Line is busy. 

## 2017-05-21 NOTE — Telephone Encounter (Signed)
[place] 5 d if they have to do 5 d. The patient has been on tramadol for a long time. It is not a new therapy for him. OK to fill full Rx after the short one  Thx

## 2017-05-22 NOTE — Telephone Encounter (Signed)
Left VM with local pharmacy calling in short supply so pt can get 30 day

## 2017-07-14 ENCOUNTER — Other Ambulatory Visit: Payer: Self-pay | Admitting: Internal Medicine

## 2017-08-21 DIAGNOSIS — C61 Malignant neoplasm of prostate: Secondary | ICD-10-CM | POA: Diagnosis not present

## 2017-08-21 DIAGNOSIS — R3121 Asymptomatic microscopic hematuria: Secondary | ICD-10-CM | POA: Diagnosis not present

## 2017-08-22 ENCOUNTER — Encounter: Payer: Self-pay | Admitting: Internal Medicine

## 2017-08-22 ENCOUNTER — Ambulatory Visit (INDEPENDENT_AMBULATORY_CARE_PROVIDER_SITE_OTHER): Payer: Medicare Other | Admitting: Internal Medicine

## 2017-08-22 DIAGNOSIS — M6530 Trigger finger, unspecified finger: Secondary | ICD-10-CM | POA: Insufficient documentation

## 2017-08-22 DIAGNOSIS — Z7901 Long term (current) use of anticoagulants: Secondary | ICD-10-CM

## 2017-08-22 DIAGNOSIS — Z9861 Coronary angioplasty status: Secondary | ICD-10-CM

## 2017-08-22 DIAGNOSIS — I2510 Atherosclerotic heart disease of native coronary artery without angina pectoris: Secondary | ICD-10-CM | POA: Diagnosis not present

## 2017-08-22 DIAGNOSIS — G8929 Other chronic pain: Secondary | ICD-10-CM

## 2017-08-22 DIAGNOSIS — M65322 Trigger finger, left index finger: Secondary | ICD-10-CM | POA: Diagnosis not present

## 2017-08-22 DIAGNOSIS — J0100 Acute maxillary sinusitis, unspecified: Secondary | ICD-10-CM | POA: Diagnosis not present

## 2017-08-22 DIAGNOSIS — M5440 Lumbago with sciatica, unspecified side: Secondary | ICD-10-CM | POA: Diagnosis not present

## 2017-08-22 MED ORDER — METHYLPREDNISOLONE 4 MG PO TBPK: ORAL_TABLET | ORAL | 0 refills | Status: DC

## 2017-08-22 NOTE — Assessment & Plan Note (Signed)
Allergic - Medrol dose pack

## 2017-08-22 NOTE — Assessment & Plan Note (Signed)
Tramadol prn 

## 2017-08-22 NOTE — Assessment & Plan Note (Signed)
Eliquis 

## 2017-08-22 NOTE — Patient Instructions (Signed)
Brix jar opener - [employer] Silicone grip pads

## 2017-08-22 NOTE — Assessment & Plan Note (Signed)
Medrol dose pack Will inject if needed  Brix jar opener - [employer] Silicone grip pads

## 2017-08-22 NOTE — Progress Notes (Signed)
Subjective:  Patient ID: [name], male    DOB: 1940/06/06  Age: 77 y.o. MRN: [id number]  CC: No chief complaint on file.   HPI [name] presents for allergies, PAD, COPD f/u C/o pain and trigger   Outpatient Medications Prior to Visit  Medication Sig Dispense Refill  . apixaban (ELIQUIS) 5 MG TABS tablet Take 1 tablet (5 mg total) by mouth 2 (two) times daily. 180 tablet 3  . atorvastatin (LIPITOR) 40 MG tablet Take 1 tablet (40 mg total) by mouth at bedtime. 90 tablet 3  . clopidogrel (PLAVIX) 75 MG tablet Take 1 tablet (75 mg total) by mouth daily. 90 tablet 3  . fluticasone (FLONASE) 50 MCG/ACT nasal spray USE ONE SPRAY IN EACH NOSTRIL DAILY AS NEEDED FOR ALLERGIES OR RHINITIS 48 g 0  . folic acid (FOLVITE) 1 MG tablet TAKE 1 TABLET DAILY 90 tablet 3  . furosemide (LASIX) 20 MG tablet Take 1 tablet (20 mg total) by mouth every other day. 90 tablet 3  . halobetasol (ULTRAVATE) 0.05 % ointment Apply topically 2 (two) times daily. 100 g 3  . loratadine (CLARITIN) 10 MG tablet Take 10 mg by mouth daily.    . metoprolol tartrate (LOPRESSOR) 50 MG tablet Take 1 tablet (50 mg total) by mouth 2 (two) times daily. 180 tablet 3  . nitroGLYCERIN (NITROSTAT) 0.4 MG SL tablet Place 0.4 mg under the tongue as needed. Chest pain    . pantoprazole (PROTONIX) 40 MG tablet Take 1 tablet (40 mg total) by mouth daily. 90 tablet 3  . potassium chloride (KLOR-CON) 8 MEQ tablet Take 1 tablet (8 mEq total) by mouth daily. 90 tablet 3  . traMADol (ULTRAM) 50 MG tablet Take 1 tablet (50 mg total) by mouth every 6 (six) hours as needed for severe pain. Pain 120 tablet 1  . zolpidem (AMBIEN) 5 MG tablet Take 1 tablet (5 mg total) by mouth at bedtime. 90 tablet 1   No facility-administered medications prior to visit.     ROS Review of Systems  Constitutional: Negative for appetite change, fatigue and unexpected weight change.  HENT: Positive for congestion and rhinorrhea. Negative  for nosebleeds, sneezing, sore throat and trouble swallowing.   Eyes: Negative for itching and visual disturbance.  Respiratory: Negative for cough.   Cardiovascular: Negative for chest pain, palpitations and leg swelling.  Gastrointestinal: Negative for abdominal distention, blood in stool, diarrhea and nausea.  Genitourinary: Negative for frequency and hematuria.  Musculoskeletal: Positive for arthralgias, back pain and gait problem. Negative for joint swelling and neck pain.  Skin: Negative for rash.  Neurological: Negative for dizziness, tremors, speech difficulty and weakness.  Psychiatric/Behavioral: Negative for agitation, dysphoric mood, sleep disturbance and suicidal ideas. The patient is not nervous/anxious.     Objective:  BP 124/64 (BP Location: Left Arm, Patient Position: Sitting, Cuff Size: Large)   Pulse (!) 54   Temp 98.5 F (36.9 C) (Oral)   Ht 5\' 11"  (1.803 m)   Wt 169 lb (76.7 kg)   SpO2 98%   BMI 23.57 kg/m   BP Readings from Last 3 Encounters:  08/22/17 124/64  05/16/17 126/68  02/13/17 (!) 124/58    Wt Readings from Last 3 Encounters:  08/22/17 169 lb (76.7 kg)  05/16/17 177 lb (80.3 kg)  02/13/17 177 lb (80.3 kg)    Physical Exam  Constitutional: He is oriented to person, place, and time. He appears well-developed. No distress.  NAD  HENT:  Mouth/Throat:  Oropharynx is clear and moist.  Eyes: Pupils are equal, round, and reactive to light. Conjunctivae are normal.  Neck: Normal range of motion. No JVD present. No thyromegaly present.  Cardiovascular: Normal rate, regular rhythm, normal heart sounds and intact distal pulses. Exam reveals no gallop and no friction rub.  No murmur heard. Pulmonary/Chest: Effort normal and breath sounds normal. No respiratory distress. He has no wheezes. He has no rales. He exhibits no tenderness.  Abdominal: Soft. Bowel sounds are normal. He exhibits no distension and no mass. There is no tenderness. There is no rebound  and no guarding.  Musculoskeletal: Normal range of motion. He exhibits tenderness. He exhibits no edema.  Lymphadenopathy:    He has no cervical adenopathy.  Neurological: He is alert and oriented to person, place, and time. He has normal reflexes. No cranial nerve deficit. He exhibits normal muscle tone. He displays a negative Romberg sign. Coordination abnormal. Gait normal.  Skin: Skin is warm and dry. No rash noted.  Psychiatric: He has a normal mood and affect. His behavior is normal. Judgment and thought content normal.    L trigger index is triggering B thumb pain LS tender walker  Lab Results  Component Value Date   WBC 7.9 05/16/2017   HGB 13.9 05/16/2017   HCT 40.4 05/16/2017   PLT 252.0 05/16/2017   GLUCOSE 107 (H) 05/16/2017   CHOL 130 05/16/2017   TRIG 119.0 05/16/2017   HDL 36.70 (L) 05/16/2017   LDLCALC 70 05/16/2017   ALT 12 05/16/2017   AST 15 05/16/2017   NA 136 05/16/2017   K 4.8 05/16/2017   CL 102 05/16/2017   CREATININE 1.03 05/16/2017   BUN 12 05/16/2017   CO2 29 05/16/2017   TSH 0.51 05/16/2017   PSA 0.01 (L) 02/20/2012   INR 1.30 05/28/2014   HGBA1C 5.7 07/11/2006    No results found.  Assessment & Plan:   There are no diagnoses linked to this encounter. I am having Tamela Gammon. Mcnicholas maintain his nitroGLYCERIN, loratadine, fluticasone, atorvastatin, clopidogrel, apixaban, furosemide, halobetasol, potassium chloride, metoprolol tartrate, pantoprazole, traMADol, zolpidem, and folic acid.  No orders of the defined types were placed in this encounter.    Follow-up: No follow-ups on file.  Walker Kehr, MD

## 2017-09-20 ENCOUNTER — Encounter: Payer: Self-pay | Admitting: Cardiovascular Disease

## 2017-10-03 ENCOUNTER — Ambulatory Visit (INDEPENDENT_AMBULATORY_CARE_PROVIDER_SITE_OTHER): Payer: Medicare Other | Admitting: Cardiovascular Disease

## 2017-10-03 ENCOUNTER — Encounter: Payer: Self-pay | Admitting: Cardiovascular Disease

## 2017-10-03 VITALS — BP 124/60 | HR 56 | Ht 71.0 in | Wt 165.1 lb

## 2017-10-03 DIAGNOSIS — I6523 Occlusion and stenosis of bilateral carotid arteries: Secondary | ICD-10-CM

## 2017-10-03 DIAGNOSIS — I739 Peripheral vascular disease, unspecified: Secondary | ICD-10-CM

## 2017-10-03 DIAGNOSIS — I483 Typical atrial flutter: Secondary | ICD-10-CM | POA: Diagnosis not present

## 2017-10-03 DIAGNOSIS — I2510 Atherosclerotic heart disease of native coronary artery without angina pectoris: Secondary | ICD-10-CM

## 2017-10-03 DIAGNOSIS — Z720 Tobacco use: Secondary | ICD-10-CM | POA: Diagnosis not present

## 2017-10-03 DIAGNOSIS — I5032 Chronic diastolic (congestive) heart failure: Secondary | ICD-10-CM

## 2017-10-03 MED ORDER — ASPIRIN EC 81 MG PO TBEC: 81.0000 mg | DELAYED_RELEASE_TABLET | Freq: Every day | ORAL | 3 refills | Status: DC

## 2017-10-03 NOTE — Patient Instructions (Signed)
Medication Instructions:  Your physician has recommended you make the following change in your medication:  Stop Clopidogrel.  Start aspirin 81 mg by mouth daily.    Labwork: none  Testing/Procedures: none  Follow-Up: Your physician recommends that you schedule a follow-up appointment in: 6 months. Please call our office in about 2 months to schedule this appointment.     Any Other Special Instructions Will Be Listed Below (If Applicable).     If you need a refill on your cardiac medications before your next appointment, please call your pharmacy.

## 2017-10-03 NOTE — Progress Notes (Signed)
Chief Complaint  Patient presents with  . Follow-up    CAD    History of Present Illness: 77 yo male with history of CAD s/p CABG 1993 and redo 2016, PAD, hyperlipidemia, atrial fibrillation/flutter, COPD, ongoing tobacco abuse, prostate cancer s/p prostatectomy/radiation who is here today for cardiac follow up. He has been followed in our office since 2011. I initially followed his PAD only. He has had PTA of the occluded left SFA and stent placement May 2011 followed by atherectomy of his right SFA in June 2011. His PAD has remained stable since the. ABI were normal July 2018. He is known to have moderate carotid artery disease, stable by dopplers July 2018. His CAD remained quiet from cath in 1999 until January 2016 when he was admitted with a NSTEMI and atrial fib with RVR. Cardiac cath with critical left main stenosis, LAD and Circumflex disease. Vein graft to OM occluded and LIMA to LAD to atretic. He developed cardiogenic shock with ventricular fibrillation. PTCA of left main and circumflex was performed. CT surgery did not think he was a CABG candidate. He was then readmitted with a NSTEMI in February 2016 and underwent re-do CABG (RIMA-Intermediate, S-OM, S-PDA, S-dist LAD).He had atrial fib/flutter post-op. He was placed on Eliquis. He underwent TEE guided DCCV March 2016 but converted back to atrial fib after cardioversion.   He is here today for follow up. The patient denies any chest pain, dyspnea, palpitations, lower extremity edema, orthopnea, PND, dizziness, near syncope or syncope.   Primary Care Physician: Cassandria Anger, MD   Past Medical History:  Diagnosis Date  . ALLERGIC RHINITIS   . CAD (coronary artery disease)    CABG x 2 with LIMA to Circumflex and vein graft to PDA  07/13/1991  . Carotid bruit    bilateral  . COPD (chronic obstructive pulmonary disease) ([place])   . Diverticulosis   . History of transient ischemic attack (TIA)   . Hyperlipidemia   .  Osteoarthritis   . Peripheral neuropathy   . Personal history of prostate cancer   . PVD (peripheral vascular disease) with claudication ([place])   . Radiation proctitis   . STEMI (ST elevation myocardial infarction) ([place]) 04/29/2014   PTCA Lmain and CFX, in setting of VF/VT arrest and CGS  . Tobacco use disorder, continuous   . Ventral hernia     Past Surgical History:  Procedure Laterality Date  . APPENDECTOMY    . CARDIOVERSION N/A 06/24/2014   Procedure: CARDIOVERSION;  Surgeon: Pixie Casino, MD;  Location: [facility];  Service: Cardiovascular;  Laterality: N/A;  . CORONARY ARTERY BYPASS GRAFT  1992  . CORONARY ARTERY BYPASS GRAFT N/A 05/28/2014   Procedure: REDO CORONARY ARTERY BYPASS GRAFTING (CABG);  Surgeon: Grace Isaac, MD;  Location: [facility];  Service: Open Heart Surgery;  Laterality: N/A;  Times 4 using right internal mammary artery to Intermediate artery and endoscopically harvested left saphenous vein to LAD, OM1, and PD coronary arteries.  [facility] LEFT HEART CATHETERIZATION WITH CORONARY ANGIOGRAM N/A 04/30/2014   Procedure: LEFT HEART CATHETERIZATION WITH CORONARY ANGIOGRAM;  Surgeon: Peter M Martinique, MD; Lmain 90% (s/p PTCA), LAD 80%, CFX 100% (s/p PTCA), RCA OK, LIMA-LAD atretic, SVG-OM 100% (chronic), EF 45%, pt req defib x 13 for VF/VT arrest, CHB w/ tem pacer, IABP and intubation  . LUMBAR LAMINECTOMY  [id number]    X2  . PROSTATECTOMY  05/2008   Dr. Alinda Money and XRT  . TEE WITHOUT CARDIOVERSION N/A 05/28/2014  Procedure: TRANSESOPHAGEAL ECHOCARDIOGRAM (TEE);  Surgeon: Grace Isaac, MD;  Location: [facility];  Service: Open Heart Surgery;  Laterality: N/A;  . TEE WITHOUT CARDIOVERSION N/A 06/24/2014   Procedure: TRANSESOPHAGEAL ECHOCARDIOGRAM (TEE);  Surgeon: Pixie Casino, MD;  Location[facility] ENDOSCOPY;  Service: Cardiovascular;  Laterality: N/A;  . TONSILLECTOMY      Current Outpatient Medications  Medication Sig Dispense Refill  . apixaban (ELIQUIS) 5 MG TABS tablet  Take 1 tablet (5 mg total) by mouth 2 (two) times daily. 180 tablet 3  . atorvastatin (LIPITOR) 40 MG tablet Take 1 tablet (40 mg total) by mouth at bedtime. 90 tablet 3  . fluticasone (FLONASE) 50 MCG/ACT nasal spray USE ONE SPRAY IN EACH NOSTRIL DAILY AS NEEDED FOR ALLERGIES OR RHINITIS 48 g 0  . folic acid (FOLVITE) 1 MG tablet TAKE 1 TABLET DAILY 90 tablet 3  . furosemide (LASIX) 20 MG tablet Take 1 tablet (20 mg total) by mouth every other day. 90 tablet 3  . halobetasol (ULTRAVATE) 0.05 % ointment Apply topically 2 (two) times daily. 100 g 3  . loratadine (CLARITIN) 10 MG tablet Take 10 mg by mouth daily.    . methylPREDNISolone (MEDROL DOSEPAK) 4 MG TBPK tablet As directed 21 tablet 0  . metoprolol tartrate (LOPRESSOR) 50 MG tablet Take 1 tablet (50 mg total) by mouth 2 (two) times daily. 180 tablet 3  . nitroGLYCERIN (NITROSTAT) 0.4 MG SL tablet Place 0.4 mg under the tongue as needed. Chest pain    . pantoprazole (PROTONIX) 40 MG tablet Take 1 tablet (40 mg total) by mouth daily. 90 tablet 3  . potassium chloride (KLOR-CON) 8 MEQ tablet Take 1 tablet (8 mEq total) by mouth daily. 90 tablet 3  . traMADol (ULTRAM) 50 MG tablet Take 1 tablet (50 mg total) by mouth every 6 (six) hours as needed for severe pain. Pain 120 tablet 1  . zolpidem (AMBIEN) 5 MG tablet Take 1 tablet (5 mg total) by mouth at bedtime. 90 tablet 1  . aspirin EC 81 MG tablet Take 1 tablet (81 mg total) by mouth daily. 90 tablet 3   No current facility-administered medications for this visit.     Allergies  Allergen Reactions  . Adhesive [Tape] Other (See Comments)    Takes skin off   . Codeine Sulfate Itching and Other (See Comments)    headaches    Social History   Socioeconomic History  . Marital status: Married    Spouse name: Not on file  . Number of children: 3  . Years of education: Not on file  . Highest education level: Not on file  Occupational History  . Occupation: Retired-self employed     Employer: RETIRED  Social Needs  . Financial resource strain: Not on file  . Food insecurity:    Worry: Not on file    Inability: Not on file  . Transportation needs:    Medical: Not on file    Non-medical: Not on file  Tobacco Use  . Smoking status: Current Every Day Smoker    Years: 40.00    Types: Cigarettes  . Smokeless tobacco: Never Used  Substance and Sexual Activity  . Alcohol use: No  . Drug use: No  . Sexual activity: Yes  Lifestyle  . Physical activity:    Days per week: Not on file    Minutes per session: Not on file  . Stress: Not on file  Relationships  . Social connections:  Talks on phone: Not on file    Gets together: Not on file    Attends religious service: Not on file    Active member of club or organization: Not on file    Attends meetings of clubs or organizations: Not on file    Relationship status: Not on file  . Intimate partner violence:    Fear of current or ex partner: Not on file    Emotionally abused: Not on file    Physically abused: Not on file    Forced sexual activity: Not on file  Other Topics Concern  . Not on file  Social History Narrative  . Not on file    Family History  Problem Relation Age of Onset  . Heart disease Father   . Coronary artery disease Father   . Lung cancer Sister   . Heart disease Brother   . Hypertension Unknown     Review of Systems:  As stated in the HPI and otherwise negative.   BP 124/60   Pulse (!) 56   Ht 5\' 11"  (1.803 m)   Wt 165 lb 1.9 oz (74.9 kg)   SpO2 98%   BMI 23.03 kg/m   Physical Examination:  General: Well developed, well nourished, NAD  HEENT: OP clear, mucus membranes moist  SKIN: warm, dry. No rashes. Neuro: No focal deficits  Musculoskeletal: Muscle strength 5/5 all ext  Psychiatric: Mood and affect normal  Neck: No JVD, no carotid bruits, no thyromegaly, no lymphadenopathy.  Lungs:Clear bilaterally, no wheezes, rhonci, crackles Cardiovascular: Regular rate and  rhythm. No murmurs, gallops or rubs. Abdomen:Soft. Bowel sounds present. Non-tender.  Extremities: No lower extremity edema. Pulses are 2 + in the bilateral DP/PT.  EKG:  EKG is ordered today. The ekg ordered today demonstrates Sinus brady, rate 56 bpm. Diffuse T wave flattening  Recent Labs: 05/16/2017: ALT 12; BUN 12; Creatinine, Ser 1.03; Hemoglobin 13.9; Platelets 252.0; Potassium 4.8; Sodium 136; TSH 0.51   Lipid Panel    Component Value Date/Time   CHOL 130 05/16/2017 1157   TRIG 119.0 05/16/2017 1157   TRIG 91 04/22/2006 0854   HDL 36.70 (L) 05/16/2017 1157   CHOLHDL 4 05/16/2017 1157   VLDL 23.8 05/16/2017 1157   LDLCALC 70 05/16/2017 1157     Wt Readings from Last 3 Encounters:  10/03/17 165 lb 1.9 oz (74.9 kg)  08/22/17 169 lb (76.7 kg)  05/16/17 177 lb (80.3 kg)     Other studies Reviewed: Additional studies/ records that were reviewed today include: . Review of the above records demonstrates:    Assessment and Plan:   1. CAD s/p CABG without angina: No chest pain. Will stop Plavix and start ASA 81 mg daily. Continue statin and beta blocker. He is not on ASA since he is on Eliquis.     2. PAD: No pain in legs. ABI stable July 2018.   3. Atrial fibrillation/flutter: Sinus today. Will continue beta blocker and Eliquis.   4. Tobacco abuse: Despite having had two open heart procedures, severe PVD and COPD, he continues to smoke and does not wish to stop. I have asked him to stop smoking again today. .   5. Carotid artery disease: Mild bilateral carotid artery disease by last dopplers in July 2018. Will repeat in 2020.   6. Chronic diastolic CHF: GURK=27% by echo 2016. He uses Lasix 20 mg three days per week.   Current medicines are reviewed at length with the patient today.  The patient  does not have concerns regarding medicines.  The following changes have been made:  no change  Labs/ tests ordered today include:   Orders Placed This Encounter  Procedures    . EKG 12-Lead    Disposition:   FU with me in 6 months  Signed, Lauree Chandler, MD 10/03/2017 12:21 PM    [facility] [address], [place], [place]  [address] Phone: [phone]; Fax: [phone]

## 2017-10-04 ENCOUNTER — Ambulatory Visit (INDEPENDENT_AMBULATORY_CARE_PROVIDER_SITE_OTHER): Payer: Medicare Other

## 2017-10-04 ENCOUNTER — Ambulatory Visit (INDEPENDENT_AMBULATORY_CARE_PROVIDER_SITE_OTHER): Payer: Medicare Other | Admitting: Orthopaedic Surgery

## 2017-10-04 ENCOUNTER — Encounter (INDEPENDENT_AMBULATORY_CARE_PROVIDER_SITE_OTHER): Payer: Self-pay | Admitting: Orthopaedic Surgery

## 2017-10-04 VITALS — BP 134/62 | HR 58 | Ht 71.0 in | Wt 165.0 lb

## 2017-10-04 DIAGNOSIS — M79642 Pain in left hand: Secondary | ICD-10-CM | POA: Diagnosis not present

## 2017-10-04 DIAGNOSIS — M545 Low back pain: Secondary | ICD-10-CM

## 2017-10-04 DIAGNOSIS — G8929 Other chronic pain: Secondary | ICD-10-CM | POA: Diagnosis not present

## 2017-10-04 DIAGNOSIS — I6523 Occlusion and stenosis of bilateral carotid arteries: Secondary | ICD-10-CM | POA: Diagnosis not present

## 2017-10-04 DIAGNOSIS — M65322 Trigger finger, left index finger: Secondary | ICD-10-CM

## 2017-10-04 MED ORDER — METHYLPREDNISOLONE ACETATE 40 MG/ML IJ SUSP
13.3300 mg | INTRAMUSCULAR | Status: AC | PRN
  Administered 2017-10-04: 13.33 mg

## 2017-10-04 MED ORDER — BUPIVACAINE HCL 0.25 % IJ SOLN
0.3300 mL | INTRAMUSCULAR | Status: AC | PRN
  Administered 2017-10-04: .33 mL

## 2017-10-04 MED ORDER — LIDOCAINE HCL 1 % IJ SOLN
0.3000 mL | INTRAMUSCULAR | Status: AC | PRN
  Administered 2017-10-04: .3 mL

## 2017-10-04 NOTE — Progress Notes (Signed)
Office Visit Note   Patient: [name]           Date of Birth: 1940-09-03           MRN: [id number] Visit Date: 10/04/2017              Requested by: Cassandria Anger, MD [place], [place] [address] PCP: Cassandria Anger, MD   Assessment & Plan: Visit Diagnoses:  1. Pain in left hand   2. Chronic bilateral low back pain, with sciatica presence unspecified     Plan: Left index trigger finger injection performed.  He will call if he has persistent triggering.  We discussed smoking sensation.  Patient states wife has Alzheimer's and still likes to smoke all the time and he states his wife continue to smoke as a reason he has not quit so far.  Follow-Up Instructions: No follow-ups on file.   Orders:  Orders Placed This Encounter  Procedures  . XR Lumbar Spine 2-3 Views  . XR Hand Complete Left   No orders of the defined types were placed in this encounter.     Procedures: Hand/UE Inj: L index A1 for trigger finger on 10/04/2017 9:16 AM Medications: 0.3 mL lidocaine 1 %; 0.33 mL bupivacaine 0.25 %; 13.33 mg methylPREDNISolone acetate 40 MG/ML      Clinical Data: No additional findings.   Subjective: Chief Complaint  Patient presents with  . Lower Back - Pain  . Left Hand - Pain    HPI 77 year old male with coronary artery disease previous surgeries SFA stent and continued smoker is seen with triggering of his left index finger repetitively.  He has had occasional discomfort with the small finger catching and somewhat the left thumb but not consistent.  He has to manually reduce his index finger.  His other problems he has had some pain in the thoracic spine between the scapula that radiates down to the lumbar spine.  He is using a rolling walker with wheels and reverse seat for the last several years.  His lower extremity duplex shows 50% narrowing SFA.  Plain radiographs lumbar spine shows extensive calcification of the abdominal aorta and  iliac bifurcation.  Review of Systems 14 point review of system positive for history of upper respiratory infections long-term smoker, PAD, a SFA stent, CABG, decreased ejection fraction 45 to 50%, mild to moderate L4-5 narrowing with previous epidurals, MI, V. tach, cardia genic shock.  Otherwise negative as it pertains to HPI.   Objective: Vital Signs: BP 134/62   Pulse (!) 58   Ht 5\' 11"  (1.803 m)   Wt 165 lb (74.8 kg)   BMI 23.01 kg/m   Physical Exam  Constitutional: He is oriented to person, place, and time. He appears well-developed and well-nourished.  HENT:  Head: Normocephalic and atraumatic.  Eyes: Pupils are equal, round, and reactive to light. EOM are normal.  Neck: No tracheal deviation present. No thyromegaly present.  Cardiovascular: Normal rate.  Pulmonary/Chest: Effort normal. He has no wheezes.  Abdominal: Soft. Bowel sounds are normal.  Neurological: He is alert and oriented to person, place, and time.  Skin: Skin is warm and dry. Capillary refill takes less than 2 seconds.  Psychiatric: He has a normal mood and affect. His behavior is normal. Judgment and thought content normal.    Ortho Exam patient demonstrates active triggering left index finger with significant tenderness over the A1 pulley.  Mild tenderness over the thumb A1 pulley but  no active triggering.  Minimal tenderness small finger left hand.  Opposite right hand has no triggering.  No rash over exposed skin good cervical range of motion.  Elbows reach full extension.  Patient has a slow stride gait using the rolling walker with reverse seat.  Specialty Comments:  No specialty comments available.  Imaging: No results found.   PMFS History: Patient Active Problem List   Diagnosis Date Noted  . Trigger finger 08/22/2017  . Insomnia 12/19/2015  . Prostate cancer ([place]) 12/19/2015  . Other bursitis of elbow, right elbow 06/25/2015  . Hypokalemia 06/29/2014  . PVD (peripheral vascular disease)  with claudication ([place])   . COPD (chronic obstructive pulmonary disease) ([place])   . Tobacco use disorder, continuous   . Hyponatremia   . Nausea with vomiting   . Orthostatic hypotension 06/22/2014  . Dehydration 06/22/2014  . Atrial flutter with rapid ventricular response ([place]) 06/22/2014  . Pain in joint, shoulder region 06/14/2014  . Acute blood loss anemia 06/09/2014  . Hypertensive heart disease 06/09/2014  . Hematuria 05/31/2014  . S/P CABG x 4 05/28/2014  . PAF- on Amiodarone 05/25/2014  . Chronic anticoagulation-on Eliquis at discharge 05/11/14 05/25/2014  . ICM-EF 45-50% by echo 04/30/14 05/25/2014  . Protein-calorie malnutrition, severe ([place]) 05/25/2014  . Encephalopathy 05/14/2014  . Acute respiratory acidosis   . S/P CABG x 2 05/04/2014  . Acute respiratory failure ([place])   . STEMI 05/04/14 with shock, CPR, CHB, VT 04/30/2014  . Cardiogenic shock ([place]) 04/30/2014  . Cardiac arrest ([place]) 04/30/2014  . VT (ventricular tachycardia) ([place]) 04/30/2014  . CHB (complete heart block) ([place]) 04/30/2014  . CAD S/P LM and CFX PCI 05/04/14 04/30/2014  . Wart 04/18/2014  . Laceration of index finger 12/04/2012  . Hand eczema 05/26/2012  . Bruising 02/20/2012  . Actinic keratoses 11/14/2011  . Constipation 07/18/2011  . Sinusitis, acute 04/18/2011  . Ventral hernia 01/03/2011  . SWEATING 06/26/2010  . History of transient ischemic attack (TIA)   . CERUMEN IMPACTION 02/20/2010  . DIVERTICULOSIS-COLON 11/01/2009  . RECTAL BLEEDING 11/01/2009  . ABDOMINAL BLOATING 11/01/2009  . ABNORMAL BOWEL SOUNDS 11/01/2009  . ABDOMINAL PAIN-RLQ 11/01/2009  . ABDOMINAL PAIN-LLQ 11/01/2009  . ABDOMINAL PAIN, GENERALIZED 11/01/2009  . Rash and nonspecific skin eruption 07/11/2009  . TRANSIENT ISCHEMIC ATTACK, HX OF 03/22/2009  . BRONCHITIS, ACUTE 11/26/2008  . Personal history of malignant neoplasm of prostate 08/02/2008  . ALLERGIC RHINITIS 08/06/2007  . ELEVATED PROSTATE SPECIFIC ANTIGEN  08/06/2007  . Hyperlipidemia 04/22/2007  . Neuropathic pain of both legs 04/22/2007  . UPPER RESPIRATORY INFECTION (URI) 04/22/2007  . COPD exacerbation ([place]) 04/22/2007  . Osteoarthritis 04/22/2007  . LOW BACK PAIN 04/22/2007   Past Medical History:  Diagnosis Date  . ALLERGIC RHINITIS   . CAD (coronary artery disease)    CABG x 2 with LIMA to Circumflex and vein graft to PDA  07/13/1991  . Carotid bruit    bilateral  . COPD (chronic obstructive pulmonary disease) ([place])   . Diverticulosis   . History of transient ischemic attack (TIA)   . Hyperlipidemia   . Osteoarthritis   . Peripheral neuropathy   . Personal history of prostate cancer   . PVD (peripheral vascular disease) with claudication ([place])   . Radiation proctitis   . STEMI (ST elevation myocardial infarction) ([place]) 04/29/2014   PTCA [place] and CFX, in setting of VF/VT arrest and CGS  . Tobacco use disorder, continuous   . Ventral hernia  Family History  Problem Relation Age of Onset  . Heart disease Father   . Coronary artery disease Father   . Lung cancer Sister   . Heart disease Brother   . Hypertension Unknown     Past Surgical History:  Procedure Laterality Date  . APPENDECTOMY    . CARDIOVERSION N/A 06/24/2014   Procedure: CARDIOVERSION;  Surgeon: Pixie Casino, MD;  Location: [facility];  Service: Cardiovascular;  Laterality: N/A;  . CORONARY ARTERY BYPASS GRAFT  1992  . CORONARY ARTERY BYPASS GRAFT N/A 05/28/2014   Procedure: REDO CORONARY ARTERY BYPASS GRAFTING (CABG);  Surgeon: Grace Isaac, MD;  Location: [facility];  Service: Open Heart Surgery;  Laterality: N/A;  Times 4 using right internal mammary artery to Intermediate artery and endoscopically harvested left saphenous vein to LAD, OM1, and PD coronary arteries.  [facility] LEFT HEART CATHETERIZATION WITH CORONARY ANGIOGRAM N/A 04/30/2014   Procedure: LEFT HEART CATHETERIZATION WITH CORONARY ANGIOGRAM;  Surgeon: Peter M Martinique, MD; [place] 90% (s/p PTCA),  LAD 80%, CFX 100% (s/p PTCA), RCA OK, LIMA-LAD atretic, SVG-OM 100% (chronic), EF 45%, pt req defib x 13 for VF/VT arrest, CHB w/ tem pacer, IABP and intubation  . LUMBAR LAMINECTOMY  [id number]    X2  . PROSTATECTOMY  05/2008   Dr. Alinda Money and XRT  . TEE WITHOUT CARDIOVERSION N/A 05/28/2014   Procedure: TRANSESOPHAGEAL ECHOCARDIOGRAM (TEE);  Surgeon: Grace Isaac, MD;  Location: [facility];  Service: Open Heart Surgery;  Laterality: N/A;  . TEE WITHOUT CARDIOVERSION N/A 06/24/2014   Procedure: TRANSESOPHAGEAL ECHOCARDIOGRAM (TEE);  Surgeon: Pixie Casino, MD;  Location[facility] ENDOSCOPY;  Service: Cardiovascular;  Laterality: N/A;  . TONSILLECTOMY     Social History   Occupational History  . Occupation: Retired-self employed    Employer: RETIRED  Tobacco Use  . Smoking status: Current Every Day Smoker    Years: 40.00    Types: Cigarettes  . Smokeless tobacco: Never Used  Substance and Sexual Activity  . Alcohol use: No  . Drug use: No  . Sexual activity: Yes

## 2017-10-08 ENCOUNTER — Ambulatory Visit: Payer: Medicare Other

## 2017-10-16 ENCOUNTER — Ambulatory Visit (INDEPENDENT_AMBULATORY_CARE_PROVIDER_SITE_OTHER): Payer: Medicare Other | Admitting: *Deleted

## 2017-10-16 VITALS — BP 142/64 | HR 50 | Resp 18 | Ht 71.0 in | Wt 166.0 lb

## 2017-10-16 DIAGNOSIS — Z0000 Encounter for general adult medical examination without abnormal findings: Secondary | ICD-10-CM | POA: Diagnosis not present

## 2017-10-16 NOTE — Patient Instructions (Addendum)
Continue doing brain stimulating activities (puzzles, reading, adult coloring books, staying active) to keep memory sharp.   Continue to eat heart healthy diet (full of fruits, vegetables, whole grains, lean protein, water--limit salt, fat, and sugar intake) and increase physical activity as tolerated.   [name] , Thank you for taking time to come for your Medicare Wellness Visit. I appreciate your ongoing commitment to your health goals. Please review the following plan we discussed and let me know if I can assist you in the future.   These are the goals we discussed: Goals    . Patient Stated     Continue to travel and enjoy life and family.       This is a list of the screening recommended for you and due dates:  Health Maintenance  Topic Date Due  . Flu Shot  11/07/2017  . Tetanus Vaccine  07/09/2022  . Pneumonia vaccines  Completed     Health Maintenance, Male A healthy lifestyle and preventive care is important for your health and wellness. Ask your health care provider about what schedule of regular examinations is right for you. What should I know about weight and diet? Eat a Healthy Diet  Eat plenty of vegetables, fruits, whole grains, low-fat dairy products, and lean protein.  Do not eat a lot of foods high in solid fats, added sugars, or salt.  Maintain a Healthy Weight Regular exercise can help you achieve or maintain a healthy weight. You should:  Do at least 150 minutes of exercise each week. The exercise should increase your heart rate and make you sweat (moderate-intensity exercise).  Do strength-training exercises at least twice a week.  Watch Your Levels of Cholesterol and Blood Lipids  Have your blood tested for lipids and cholesterol every 5 years starting at 79 years of age. If you are at high risk for heart disease, you should start having your blood tested when you are 79 years old. You may need to have your cholesterol levels checked more often  if: ? Your lipid or cholesterol levels are high. ? You are older than 79 years of age. ? You are at high risk for heart disease.  What should I know about cancer screening? Many types of cancers can be detected early and may often be prevented. Lung Cancer  You should be screened every year for lung cancer if: ? You are a current smoker who has smoked for at least 30 years. ? You are a former smoker who has quit within the past 15 years.  Talk to your health care provider about your screening options, when you should start screening, and how often you should be screened.  Colorectal Cancer  Routine colorectal cancer screening usually begins at 80 years of age and should be repeated every 5-10 years until you are 79 years old. You may need to be screened more often if early forms of precancerous polyps or small growths are found. Your health care provider may recommend screening at an earlier age if you have risk factors for colon cancer.  Your health care provider may recommend using home test kits to check for hidden blood in the stool.  A small camera at the end of a tube can be used to examine your colon (sigmoidoscopy or colonoscopy). This checks for the earliest forms of colorectal cancer.  Prostate and Testicular Cancer  Depending on your age and overall health, your health care provider may do certain tests to screen for prostate and  testicular cancer.  Talk to your health care provider about any symptoms or concerns you have about testicular or prostate cancer.  Skin Cancer  Check your skin from head to toe regularly.  Tell your health care provider about any new moles or changes in moles, especially if: ? There is a change in a mole's size, shape, or color. ? You have a mole that is larger than a pencil eraser.  Always use sunscreen. Apply sunscreen liberally and repeat throughout the day.  Protect yourself by wearing long sleeves, pants, a wide-brimmed hat, and  sunglasses when outside.  What should I know about heart disease, diabetes, and high blood pressure?  If you are 75-22 years of age, have your blood pressure checked every 3-5 years. If you are 7 years of age or older, have your blood pressure checked every year. You should have your blood pressure measured twice-once when you are at a hospital or clinic, and once when you are not at a hospital or clinic. Record the average of the two measurements. To check your blood pressure when you are not at a hospital or clinic, you can use: ? An automated blood pressure machine at a pharmacy. ? A home blood pressure monitor.  Talk to your health care provider about your target blood pressure.  If you are between 46-25 years old, ask your health care provider if you should take aspirin to prevent heart disease.  Have regular diabetes screenings by checking your fasting blood sugar level. ? If you are at a normal weight and have a low risk for diabetes, have this test once every three years after the age of 34. ? If you are overweight and have a high risk for diabetes, consider being tested at a younger age or more often.  A one-time screening for abdominal aortic aneurysm (AAA) by ultrasound is recommended for men aged 76-75 years who are current or former smokers. What should I know about preventing infection? Hepatitis B If you have a higher risk for hepatitis B, you should be screened for this virus. Talk with your health care provider to find out if you are at risk for hepatitis B infection. Hepatitis C Blood testing is recommended for:  Everyone born from [birth year] through 1967.  Anyone with known risk factors for hepatitis C.  Sexually Transmitted Diseases (STDs)  You should be screened each year for STDs including gonorrhea and chlamydia if: ? You are sexually active and are younger than 79 years of age. ? You are older than 79 years of age and your health care provider tells you that you are  at risk for this type of infection. ? Your sexual activity has changed since you were last screened and you are at an increased risk for chlamydia or gonorrhea. Ask your health care provider if you are at risk.  Talk with your health care provider about whether you are at high risk of being infected with HIV. Your health care provider may recommend a prescription medicine to help prevent HIV infection.  What else can I do?  Schedule regular health, dental, and eye exams.  Stay current with your vaccines (immunizations).  Do not use any tobacco products, such as cigarettes, chewing tobacco, and e-cigarettes. If you need help quitting, ask your health care provider.  Limit alcohol intake to no more than 2 drinks per day. One drink equals 12 ounces of beer, 5 ounces of [place] La[place], or 1 ounces of hard liquor.  Do not use street  drugs.  Do not share needles.  Ask your health care provider for help if you need support or information about quitting drugs.  Tell your health care provider if you often feel depressed.  Tell your health care provider if you have ever been abused or do not feel safe at home. This information is not intended to replace advice given to you by your health care provider. Make sure you discuss any questions you have with your health care provider. Document Released: 09/22/2007 Document Revised: 11/23/2015 Document Reviewed: 12/28/2014 Elsevier Interactive Patient Education  [employer].

## 2017-10-16 NOTE — Progress Notes (Addendum)
Subjective:   [name] is a 77 y.o. male who presents for Medicare Annual/Subsequent preventive examination.  Review of Systems:  No ROS.  Medicare Wellness Visit. Additional risk factors are reflected in the social history.  Cardiac Risk Factors include: advanced age (>60men, >38 women);dyslipidemia;hypertension;male gender;smoking/ tobacco exposure Sleep patterns: has interrupted sleep, gets up 2 times nightly to void and sleeps 5-6 hours nightly.    Home Safety/Smoke Alarms: Feels safe in home. Smoke alarms in place.  Living environment; residence and Firearm Safety: 2-story house, equipment: Walkers, Type: rollator, no firearms. Lives with wife, no needs for DME, good support system Seat Belt Safety/Bike Helmet: Wears seat belt.      Objective:    Vitals: BP (!) 142/64   Pulse (!) 50   Resp 18   Ht 5\' 11"  (1.803 m)   Wt 166 lb (75.3 kg)   SpO2 98%   BMI 23.15 kg/m   Body mass index is 23.15 kg/m.  Advanced Directives 10/16/2017 10/04/2016 08/30/2014 08/20/2014 08/12/2014 06/24/2014 06/22/2014  Does Patient Have a Medical Advance Directive? Yes Yes No No No No No  Type of Paramedic of [place];Living will [place];Living will - - - - -  Does patient want to make changes to medical advance directive? - - - - - - -  Copy of [place] in Chart? No - copy requested No - copy requested - - - - -  Would patient like information on creating a medical advance directive? - - Yes - Scientist, clinical (histocompatibility and immunogenetics) given Yes - Scientist, clinical (histocompatibility and immunogenetics) given Yes - Scientist, clinical (histocompatibility and immunogenetics) given - -    Tobacco Social History   Tobacco Use  Smoking Status Current Every Day Smoker  . Years: 40.00  . Types: Cigarettes  Smokeless Tobacco Never Used     Ready to quit: Not Answered Counseling given: Not Answered  Past Medical History:  Diagnosis Date  . ALLERGIC RHINITIS   . CAD (coronary artery disease)    CABG x 2 with LIMA  to Circumflex and vein graft to PDA  07/13/1991  . Carotid bruit    bilateral  . COPD (chronic obstructive pulmonary disease) ([place])   . Diverticulosis   . History of transient ischemic attack (TIA)   . Hyperlipidemia   . Osteoarthritis   . Peripheral neuropathy   . Personal history of prostate cancer   . PVD (peripheral vascular disease) with claudication ([place])   . Radiation proctitis   . STEMI (ST elevation myocardial infarction) ([place]) 04/29/2014   PTCA Lmain and CFX, in setting of VF/VT arrest and CGS  . Tobacco use disorder, continuous   . Ventral hernia    Past Surgical History:  Procedure Laterality Date  . APPENDECTOMY    . CARDIOVERSION N/A 06/24/2014   Procedure: CARDIOVERSION;  Surgeon: Pixie Casino, MD;  Location: [place];  Service: Cardiovascular;  Laterality: N/A;  . CORONARY ARTERY BYPASS GRAFT  1992  . CORONARY ARTERY BYPASS GRAFT N/A 05/28/2014   Procedure: REDO CORONARY ARTERY BYPASS GRAFTING (CABG);  Surgeon: Grace Isaac, MD;  Location: [facility];  Service: Open Heart Surgery;  Laterality: N/A;  Times 4 using right internal mammary artery to Intermediate artery and endoscopically harvested left saphenous vein to LAD, OM1, and PD coronary arteries.  Marland Kitchen LEFT HEART CATHETERIZATION WITH CORONARY ANGIOGRAM N/A 04/30/2014   Procedure: LEFT HEART CATHETERIZATION WITH CORONARY ANGIOGRAM;  Surgeon: Peter M Martinique, MD; Lmain 90% (s/p PTCA), LAD 80%, CFX  100% (s/p PTCA), RCA OK, LIMA-LAD atretic, SVG-OM 100% (chronic), EF 45%, pt req defib x 13 for VF/VT arrest, CHB w/ tem pacer, IABP and intubation  . LUMBAR LAMINECTOMY  [id number]    X2  . PROSTATECTOMY  05/2008   Dr. Alinda Money and XRT  . TEE WITHOUT CARDIOVERSION N/A 05/28/2014   Procedure: TRANSESOPHAGEAL ECHOCARDIOGRAM (TEE);  Surgeon: Grace Isaac, MD;  Location: [facility];  Service: Open Heart Surgery;  Laterality: N/A;  . TEE WITHOUT CARDIOVERSION N/A 06/24/2014   Procedure: TRANSESOPHAGEAL ECHOCARDIOGRAM (TEE);   Surgeon: Pixie Casino, MD;  Location: [facility] ENDOSCOPY;  Service: Cardiovascular;  Laterality: N/A;  . TONSILLECTOMY     Family History  Problem Relation Age of Onset  . Heart disease Father   . Coronary artery disease Father   . Lung cancer Sister   . Heart disease Brother   . Hypertension Unknown    Social History   Socioeconomic History  . Marital status: Married    Spouse name: Not on file  . Number of children: 3  . Years of education: Not on file  . Highest education level: Not on file  Occupational History  . Occupation: Retired-self employed    Employer: RETIRED  Social Needs  . Financial resource strain: Not hard at all  . Food insecurity:    Worry: Never true    Inability: Never true  . Transportation needs:    Medical: No    Non-medical: No  Tobacco Use  . Smoking status: Current Every Day Smoker    Years: 40.00    Types: Cigarettes  . Smokeless tobacco: Never Used  Substance and Sexual Activity  . Alcohol use: No  . Drug use: No  . Sexual activity: Not Currently  Lifestyle  . Physical activity:    Days per week: 0 days    Minutes per session: 0 min  . Stress: Not at all  Relationships  . Social connections:    Talks on phone: More than three times a week    Gets together: More than three times a week    Attends religious service: 1 to 4 times per year    Active member of club or organization: Yes    Attends meetings of clubs or organizations: More than 4 times per year    Relationship status: Married  Other Topics Concern  . Not on file  Social History Narrative  . Not on file    Outpatient Encounter Medications as of 10/16/2017  Medication Sig  . apixaban (ELIQUIS) 5 MG TABS tablet Take 1 tablet (5 mg total) by mouth 2 (two) times daily.  Marland Kitchen aspirin EC 81 MG tablet Take 1 tablet (81 mg total) by mouth daily.  Marland Kitchen atorvastatin (LIPITOR) 40 MG tablet Take 1 tablet (40 mg total) by mouth at bedtime.  . fluticasone (FLONASE) 50 MCG/ACT nasal spray  USE ONE SPRAY IN EACH NOSTRIL DAILY AS NEEDED FOR ALLERGIES OR RHINITIS  . folic acid (FOLVITE) 1 MG tablet TAKE 1 TABLET DAILY  . furosemide (LASIX) 20 MG tablet Take 1 tablet (20 mg total) by mouth every other day.  . halobetasol (ULTRAVATE) 0.05 % ointment Apply topically 2 (two) times daily.  Marland Kitchen loratadine (CLARITIN) 10 MG tablet Take 10 mg by mouth daily.  . metoprolol tartrate (LOPRESSOR) 50 MG tablet Take 1 tablet (50 mg total) by mouth 2 (two) times daily.  . nitroGLYCERIN (NITROSTAT) 0.4 MG SL tablet Place 0.4 mg under the tongue as needed. Chest pain  .  pantoprazole (PROTONIX) 40 MG tablet Take 1 tablet (40 mg total) by mouth daily.  . potassium chloride (KLOR-CON) 8 MEQ tablet Take 1 tablet (8 mEq total) by mouth daily.  . traMADol (ULTRAM) 50 MG tablet Take 1 tablet (50 mg total) by mouth every 6 (six) hours as needed for severe pain. Pain  . zolpidem (AMBIEN) 5 MG tablet Take 1 tablet (5 mg total) by mouth at bedtime.  . [DISCONTINUED] methylPREDNISolone (MEDROL DOSEPAK) 4 MG TBPK tablet As directed (Patient not taking: Reported on 10/16/2017)   No facility-administered encounter medications on file as of 10/16/2017.     Activities of Daily Living In your present state of health, do you have any difficulty performing the following activities: 10/16/2017  Hearing? N  Vision? N  Difficulty concentrating or making decisions? N  Walking or climbing stairs? N  Dressing or bathing? N  Doing errands, shopping? N  Preparing Food and eating ? N  Using the Toilet? N  In the past six months, have you accidently leaked urine? N  Do you have problems with loss of bowel control? N  Managing your Medications? N  Managing your Finances? N  Housekeeping or managing your Housekeeping? N  Some recent data might be hidden    Patient Care Team: Plotnikov, Evie Lacks, MD as PCP - General Burnell Blanks, MD as PCP - Cardiology (Cardiology) Raynelle Bring, MD as Consulting Physician  (Urology) Burnell Blanks, MD as Consulting Physician (Cardiology) Jola Schmidt, MD as Referring Physician (Ophthalmology)   Assessment:   This is a routine wellness examination for [place]. Physical assessment deferred to PCP.   Exercise Activities and Dietary recommendations Current Exercise Habits: The patient does not participate in regular exercise at present, Exercise limited by: orthopedic condition(s)  Diet (meal preparation, eat out, water intake, caffeinated beverages, dairy products, fruits and vegetables): in general, a "healthy" diet  , well balanced, eats a variety of fruits and vegetables daily, limits salt, fat/cholesterol, sugar,carbohydrates,caffeine, drinks 4-5 glasses of water daily.  Encouraged patient to increase daily water and fluid intake.  Goals    . Patient Stated     Continue to travel and enjoy life and family.       Fall Risk Fall Risk  10/16/2017 10/04/2016 09/12/2015 07/19/2014  Falls in the past year? No Yes Yes Yes  Number falls in past yr: - 1 1 2  or more  Injury with Fall? - No Yes Yes  Risk for fall due to : Impaired balance/gait Impaired mobility - -  Follow up - Falls prevention discussed - -    Depression Screen PHQ 2/9 Scores 10/16/2017 10/04/2016 09/12/2015 01/26/2015  PHQ - 2 Score 0 0 0 0    Cognitive Function MMSE - Mini Mental State Exam 10/16/2017 10/04/2016  Orientation to time 5 5  Orientation to Place 5 5  Registration 3 3  Attention/ Calculation 5 5  Recall 3 3  Language- name 2 objects 2 2  Language- repeat 1 1  Language- follow 3 step command 3 3  Language- read & follow direction 1 1  Write a sentence 1 1  Copy design 1 1  Total score 30 30        Immunization History  Administered Date(s) Administered  . H1N1 03/16/2008  . Influenza Split 02/20/2012  . Influenza Whole 01/07/2008, 02/05/2008, 02/01/2009, 01/25/2010  . Influenza, High Dose Seasonal PF 12/19/2015, 01/10/2017  . Influenza,inj,Quad PF,6+ Mos  12/24/2012, 01/11/2014, 02/08/2015  . Pneumococcal Conjugate-13 04/14/2014  .  Pneumococcal Polysaccharide-23 04/23/2005, 03/19/2016  . Td 02/20/2010  . Tdap 07/08/2012   Screening Tests Health Maintenance  Topic Date Due  . INFLUENZA VACCINE  11/07/2017  . TETANUS/TDAP  07/09/2022  . PNA vac Low Risk Adult  Completed      Plan:     Continue doing brain stimulating activities (puzzles, reading, adult coloring books, staying active) to keep memory sharp.   Continue to eat heart healthy diet (full of fruits, vegetables, whole grains, lean protein, water--limit salt, fat, and sugar intake) and increase physical activity as tolerated.  I have personally reviewed and noted the following in the patient's chart:   . Medical and social history . Use of alcohol, tobacco or illicit drugs  . Current medications and supplements . Functional ability and status . Nutritional status . Physical activity . Advanced directives . List of other physicians . Vitals . Screenings to include cognitive, depression, and falls . Referrals and appointments  In addition, I have reviewed and discussed with patient certain preventive protocols, quality metrics, and best practice recommendations. A written personalized care plan for preventive services as well as general preventive health recommendations were provided to patient.     Michiel Cowboy, RN  10/16/2017  Medical screening examination/treatment/procedure(s) were performed by non-physician practitioner and as supervising physician I was immediately available for consultation/collaboration. I agree with above. Lew Dawes, MD

## 2017-11-20 ENCOUNTER — Encounter: Payer: Self-pay | Admitting: Internal Medicine

## 2017-11-20 ENCOUNTER — Ambulatory Visit (INDEPENDENT_AMBULATORY_CARE_PROVIDER_SITE_OTHER): Payer: Medicare Other | Admitting: Internal Medicine

## 2017-11-20 ENCOUNTER — Other Ambulatory Visit (INDEPENDENT_AMBULATORY_CARE_PROVIDER_SITE_OTHER): Payer: Medicare Other

## 2017-11-20 DIAGNOSIS — M5440 Lumbago with sciatica, unspecified side: Secondary | ICD-10-CM | POA: Diagnosis not present

## 2017-11-20 DIAGNOSIS — I110 Hypertensive heart disease with heart failure: Secondary | ICD-10-CM

## 2017-11-20 DIAGNOSIS — G8929 Other chronic pain: Secondary | ICD-10-CM | POA: Diagnosis not present

## 2017-11-20 DIAGNOSIS — I4892 Unspecified atrial flutter: Secondary | ICD-10-CM

## 2017-11-20 DIAGNOSIS — C61 Malignant neoplasm of prostate: Secondary | ICD-10-CM

## 2017-11-20 DIAGNOSIS — I6523 Occlusion and stenosis of bilateral carotid arteries: Secondary | ICD-10-CM | POA: Diagnosis not present

## 2017-11-20 DIAGNOSIS — F17209 Nicotine dependence, unspecified, with unspecified nicotine-induced disorders: Secondary | ICD-10-CM | POA: Diagnosis not present

## 2017-11-20 DIAGNOSIS — Z8673 Personal history of transient ischemic attack (TIA), and cerebral infarction without residual deficits: Secondary | ICD-10-CM

## 2017-11-20 LAB — CBC WITH DIFFERENTIAL/PLATELET
BASOS PCT: 0.3 % (ref 0.0–3.0)
Basophils Absolute: 0 10*3/uL (ref 0.0–0.1)
EOS PCT: 12.9 % — AB (ref 0.0–5.0)
Eosinophils Absolute: 1.1 10*3/uL — ABNORMAL HIGH (ref 0.0–0.7)
HEMATOCRIT: 39.1 % (ref 39.0–52.0)
HEMOGLOBIN: 13.5 g/dL (ref 13.0–17.0)
LYMPHS PCT: 15.8 % (ref 12.0–46.0)
Lymphs Abs: 1.4 10*3/uL (ref 0.7–4.0)
MCHC: 34.5 g/dL (ref 30.0–36.0)
MCV: 93.6 fl (ref 78.0–100.0)
MONO ABS: 1 10*3/uL (ref 0.1–1.0)
Monocytes Relative: 11 % (ref 3.0–12.0)
NEUTROS ABS: 5.2 10*3/uL (ref 1.4–7.7)
Neutrophils Relative %: 60 % (ref 43.0–77.0)
Platelets: 236 10*3/uL (ref 150.0–400.0)
RBC: 4.18 Mil/uL — ABNORMAL LOW (ref 4.22–5.81)
RDW: 14.3 % (ref 11.5–15.5)
WBC: 8.7 10*3/uL (ref 4.0–10.5)

## 2017-11-20 LAB — BASIC METABOLIC PANEL
BUN: 15 mg/dL (ref 6–23)
CALCIUM: 9.7 mg/dL (ref 8.4–10.5)
CO2: 29 mEq/L (ref 19–32)
Chloride: 103 mEq/L (ref 96–112)
Creatinine, Ser: 1.14 mg/dL (ref 0.40–1.50)
GFR: 65.88 mL/min (ref >60.00)
GLUCOSE: 95 mg/dL (ref 70–99)
Potassium: 4.6 mEq/L (ref 3.5–5.1)
Sodium: 139 mEq/L (ref 135–145)

## 2017-11-20 NOTE — Assessment & Plan Note (Signed)
F/u w/Dr Borden 

## 2017-11-20 NOTE — Assessment & Plan Note (Signed)
Toprol, Furosemide 

## 2017-11-20 NOTE — Assessment & Plan Note (Signed)
1/4 ppd ?

## 2017-11-20 NOTE — Assessment & Plan Note (Signed)
On Eliquis

## 2017-11-20 NOTE — Assessment & Plan Note (Signed)
No relapse Eliquis, Lipitor

## 2017-11-20 NOTE — Assessment & Plan Note (Signed)
Using a walker/cane Tylenol prn Tramadol at hs via [place]

## 2017-11-20 NOTE — Progress Notes (Signed)
Subjective:  Patient ID: [name], male    DOB: December 03, 1940  Age: 77 y.o. MRN: [id number]  CC: No chief complaint on file.   HPI [name] presents for OA/LBP, CAD, COPD f/u  Outpatient Medications Prior to Visit  Medication Sig Dispense Refill  . apixaban (ELIQUIS) 5 MG TABS tablet Take 1 tablet (5 mg total) by mouth 2 (two) times daily. 180 tablet 3  . aspirin EC 81 MG tablet Take 1 tablet (81 mg total) by mouth daily. 90 tablet 3  . atorvastatin (LIPITOR) 40 MG tablet Take 1 tablet (40 mg total) by mouth at bedtime. 90 tablet 3  . fluticasone (FLONASE) 50 MCG/ACT nasal spray USE ONE SPRAY IN EACH NOSTRIL DAILY AS NEEDED FOR ALLERGIES OR RHINITIS 48 g 0  . folic acid (FOLVITE) 1 MG tablet TAKE 1 TABLET DAILY 90 tablet 3  . furosemide (LASIX) 20 MG tablet Take 1 tablet (20 mg total) by mouth every other day. 90 tablet 3  . halobetasol (ULTRAVATE) 0.05 % ointment Apply topically 2 (two) times daily. 100 g 3  . loratadine (CLARITIN) 10 MG tablet Take 10 mg by mouth daily.    . metoprolol tartrate (LOPRESSOR) 50 MG tablet Take 1 tablet (50 mg total) by mouth 2 (two) times daily. 180 tablet 3  . nitroGLYCERIN (NITROSTAT) 0.4 MG SL tablet Place 0.4 mg under the tongue as needed. Chest pain    . pantoprazole (PROTONIX) 40 MG tablet Take 1 tablet (40 mg total) by mouth daily. 90 tablet 3  . potassium chloride (KLOR-CON) 8 MEQ tablet Take 1 tablet (8 mEq total) by mouth daily. 90 tablet 3  . traMADol (ULTRAM) 50 MG tablet Take 1 tablet (50 mg total) by mouth every 6 (six) hours as needed for severe pain. Pain 120 tablet 1  . zolpidem (AMBIEN) 5 MG tablet Take 1 tablet (5 mg total) by mouth at bedtime. 90 tablet 1   No facility-administered medications prior to visit.     ROS: Review of Systems  Constitutional: Positive for fatigue. Negative for appetite change and unexpected weight change.  HENT: Negative for congestion, nosebleeds, sneezing, sore throat and trouble  swallowing.   Eyes: Negative for itching and visual disturbance.  Respiratory: Positive for cough.   Cardiovascular: Negative for chest pain, palpitations and leg swelling.  Gastrointestinal: Negative for abdominal distention, blood in stool, diarrhea and nausea.  Genitourinary: Negative for frequency and hematuria.  Musculoskeletal: Positive for back pain and gait problem. Negative for joint swelling and neck pain.  Skin: Negative for rash.  Neurological: Negative for dizziness, tremors, speech difficulty and weakness.  Psychiatric/Behavioral: Negative for agitation, dysphoric mood, sleep disturbance and suicidal ideas. The patient is not nervous/anxious.     Objective:  BP 134/68 (BP Location: Left Arm, Patient Position: Sitting, Cuff Size: Normal)   Pulse (!) 54   Temp 98.3 F (36.8 C) (Oral)   Ht 5\' 11"  (1.803 m)   Wt 169 lb (76.7 kg)   SpO2 98%   BMI 23.57 kg/m   BP Readings from Last 3 Encounters:  11/20/17 134/68  10/16/17 (!) 142/64  10/04/17 134/62    Wt Readings from Last 3 Encounters:  11/20/17 169 lb (76.7 kg)  10/16/17 166 lb (75.3 kg)  10/04/17 165 lb (74.8 kg)    Physical Exam  Constitutional: He is oriented to person, place, and time. He appears well-developed. No distress.  NAD  HENT:  Mouth/Throat: Oropharynx is clear and moist.  Eyes: Pupils  are equal, round, and reactive to light. Conjunctivae are normal.  Neck: Normal range of motion. No JVD present. No thyromegaly present.  Cardiovascular: Normal rate, regular rhythm, normal heart sounds and intact distal pulses. Exam reveals no gallop and no friction rub.  No murmur heard. Pulmonary/Chest: Effort normal and breath sounds normal. No respiratory distress. He has no wheezes. He has no rales. He exhibits no tenderness.  Abdominal: Soft. Bowel sounds are normal. He exhibits no distension and no mass. There is no tenderness. There is no rebound and no guarding.  Musculoskeletal: Normal range of motion.  He exhibits tenderness. He exhibits no edema.  Lymphadenopathy:    He has no cervical adenopathy.  Neurological: He is alert and oriented to person, place, and time. He has normal reflexes. No cranial nerve deficit. He exhibits normal muscle tone. He displays a negative Romberg sign. Coordination abnormal. Gait normal.  Skin: Skin is warm and dry. No rash noted.  Psychiatric: He has a normal mood and affect. His behavior is normal. Judgment and thought content normal.  LS tender Walker UEs w/bruises  Lab Results  Component Value Date   WBC 7.9 05/16/2017   HGB 13.9 05/16/2017   HCT 40.4 05/16/2017   PLT 252.0 05/16/2017   GLUCOSE 107 (H) 05/16/2017   CHOL 130 05/16/2017   TRIG 119.0 05/16/2017   HDL 36.70 (L) 05/16/2017   LDLCALC 70 05/16/2017   ALT 12 05/16/2017   AST 15 05/16/2017   NA 136 05/16/2017   K 4.8 05/16/2017   CL 102 05/16/2017   CREATININE 1.03 05/16/2017   BUN 12 05/16/2017   CO2 29 05/16/2017   TSH 0.51 05/16/2017   PSA 0.01 (L) 02/20/2012   INR 1.30 05/28/2014   HGBA1C 5.7 07/11/2006    No results found.  Assessment & Plan:   There are no diagnoses linked to this encounter.   No orders of the defined types were placed in this encounter.    Follow-up: No follow-ups on file.  Walker Kehr, MD

## 2018-01-22 ENCOUNTER — Other Ambulatory Visit: Payer: Self-pay

## 2018-01-22 MED ORDER — ZOLPIDEM TARTRATE 5 MG PO TABS: 5.0000 mg | ORAL_TABLET | Freq: Every day | ORAL | 0 refills | Status: DC

## 2018-01-22 NOTE — Telephone Encounter (Signed)
Patient needs office visit before refills will be given   Checked controlled substance database, last filled 05/23/17. Last OV: 11/20/17 Next Ov: 03/04/18

## 2018-01-22 NOTE — Telephone Encounter (Signed)
Done erx 

## 2018-02-27 ENCOUNTER — Other Ambulatory Visit: Payer: Self-pay | Admitting: Internal Medicine

## 2018-02-27 NOTE — Telephone Encounter (Signed)
Copied from [place] [id number]. Topic: Quick Communication - Rx Refill/Question >> Feb 27, 2018  2:44 PM Bea Graff, NT wrote: Medication: apixaban (ELIQUIS) 5 MG TABS tablet   Has the patient contacted their pharmacy? Yes.   (Agent: If no, request that the patient contact the pharmacy for the refill.) (Agent: If yes, when and what did the pharmacy advise?) No refills left and doctor will need to renew  Preferred Pharmacy (with phone number or street name): [id number] - [place], [place] [phone] (Phone) [phone] (Fax)  Pt requesting a 1 month refill of the medication to [facility] and then the rest of the rx sent to   [place], [place] [phone] (Phone) [phone] (Fax)        Agent: Please be advised that RX refills may take up to 3 business days. We ask that you follow-up with your pharmacy.

## 2018-02-28 MED ORDER — APIXABAN 5 MG PO TABS: 5.0000 mg | ORAL_TABLET | Freq: Two times a day (BID) | ORAL | 3 refills | Status: DC

## 2018-02-28 NOTE — Telephone Encounter (Signed)
Requested Prescriptions  Pending Prescriptions Disp Refills  . apixaban (ELIQUIS) 5 MG TABS tablet 180 tablet 3    Sig: Take 1 tablet (5 mg total) by mouth 2 (two) times daily.     Hematology:  Anticoagulants Passed - 02/28/2018  8:40 AM      Passed - HGB in normal range and within 360 days    Hemoglobin  Date Value Ref Range Status  11/20/2017 13.5 13.0 - 17.0 g/dL Final   HGB  Date Value Ref Range Status  12/01/2008 13.2 13.0 - 17.1 g/dL Final         Passed - PLT in normal range and within 360 days    Platelets  Date Value Ref Range Status  11/20/2017 236.0 150.0 - 400.0 K/uL Final  12/01/2008 205 140 - 400 10e3/uL Final         Passed - HCT in normal range and within 360 days    HCT  Date Value Ref Range Status  11/20/2017 39.1 39.0 - 52.0 % Final  12/01/2008 38.3 (L) 38.4 - 49.9 % Final         Passed - Cr in normal range and within 360 days    Creatinine, Ser  Date Value Ref Range Status  11/20/2017 1.14 0.40 - 1.50 mg/dL Final         Passed - Valid encounter within last 12 months    Recent Outpatient Visits          3 months ago Tobacco use disorder, continuous   [place], Evie Lacks, MD   6 months ago Acute non-recurrent maxillary sinusitis   [facility] Plot[facility], Evie Lacks, MD   9 months ago PVD (peripheral vascular disease) with claudication [facility])   [facility] Plot[facility], Evie Lacks, MD   1 year ago CAD S/P LM and CFX PCI 05/04/14   [facility] Plot[facility], Evie Lacks, MD   1 year ago Primary insomnia   [place], [place]      Future Appointments            In 4 days Plotnikov, Evie Lacks, MD [place], [place]

## 2018-02-28 NOTE — Telephone Encounter (Signed)
Pt calling and states that the rx does not need to go to [facility] as he did receive the refill in the mail yesterday. He states the refill does need to be still sent to [facility].

## 2018-03-04 ENCOUNTER — Encounter: Payer: Self-pay | Admitting: Internal Medicine

## 2018-03-04 ENCOUNTER — Ambulatory Visit (INDEPENDENT_AMBULATORY_CARE_PROVIDER_SITE_OTHER): Payer: Medicare Other | Admitting: Internal Medicine

## 2018-03-04 DIAGNOSIS — I110 Hypertensive heart disease with heart failure: Secondary | ICD-10-CM | POA: Diagnosis not present

## 2018-03-04 DIAGNOSIS — J441 Chronic obstructive pulmonary disease with (acute) exacerbation: Secondary | ICD-10-CM | POA: Diagnosis not present

## 2018-03-04 DIAGNOSIS — I6523 Occlusion and stenosis of bilateral carotid arteries: Secondary | ICD-10-CM

## 2018-03-04 DIAGNOSIS — Z9861 Coronary angioplasty status: Secondary | ICD-10-CM | POA: Diagnosis not present

## 2018-03-04 DIAGNOSIS — I4892 Unspecified atrial flutter: Secondary | ICD-10-CM | POA: Diagnosis not present

## 2018-03-04 DIAGNOSIS — E782 Mixed hyperlipidemia: Secondary | ICD-10-CM

## 2018-03-04 DIAGNOSIS — I2510 Atherosclerotic heart disease of native coronary artery without angina pectoris: Secondary | ICD-10-CM | POA: Diagnosis not present

## 2018-03-04 NOTE — Assessment & Plan Note (Signed)
Eliquis,

## 2018-03-04 NOTE — Assessment & Plan Note (Signed)
Plavix, Lipitor, Eliquis 

## 2018-03-04 NOTE — Patient Instructions (Signed)
Afrin nasal spray as needed

## 2018-03-04 NOTE — Assessment & Plan Note (Signed)
Chronic COPD - in a smoker

## 2018-03-04 NOTE — Assessment & Plan Note (Signed)
Toprol, Furosemide 

## 2018-03-04 NOTE — Progress Notes (Signed)
Subjective:  Patient ID: [name], male    DOB: 1940-06-01  Age: 77 y.o. MRN: [id number]  CC: No chief complaint on file.   HPI [name] presents for COPD, CAD, HTN C/o nose running water x 1 d  Outpatient Medications Prior to Visit  Medication Sig Dispense Refill  . apixaban (ELIQUIS) 5 MG TABS tablet Take 1 tablet (5 mg total) by mouth 2 (two) times daily. 180 tablet 3  . aspirin EC 81 MG tablet Take 1 tablet (81 mg total) by mouth daily. 90 tablet 3  . atorvastatin (LIPITOR) 40 MG tablet Take 1 tablet (40 mg total) by mouth at bedtime. 90 tablet 3  . fluticasone (FLONASE) 50 MCG/ACT nasal spray USE ONE SPRAY IN EACH NOSTRIL DAILY AS NEEDED FOR ALLERGIES OR RHINITIS 48 g 0  . folic acid (FOLVITE) 1 MG tablet TAKE 1 TABLET DAILY 90 tablet 3  . furosemide (LASIX) 20 MG tablet Take 1 tablet (20 mg total) by mouth every other day. 90 tablet 3  . halobetasol (ULTRAVATE) 0.05 % ointment Apply topically 2 (two) times daily. 100 g 3  . loratadine (CLARITIN) 10 MG tablet Take 10 mg by mouth daily.    . metoprolol tartrate (LOPRESSOR) 50 MG tablet Take 1 tablet (50 mg total) by mouth 2 (two) times daily. 180 tablet 3  . nitroGLYCERIN (NITROSTAT) 0.4 MG SL tablet Place 0.4 mg under the tongue as needed. Chest pain    . pantoprazole (PROTONIX) 40 MG tablet Take 1 tablet (40 mg total) by mouth daily. 90 tablet 3  . potassium chloride (KLOR-CON) 8 MEQ tablet Take 1 tablet (8 mEq total) by mouth daily. 90 tablet 3  . traMADol (ULTRAM) 50 MG tablet Take 1 tablet (50 mg total) by mouth every 6 (six) hours as needed for severe pain. Pain 120 tablet 1  . zolpidem (AMBIEN) 5 MG tablet Take 1 tablet (5 mg total) by mouth at bedtime. 90 tablet 0   No facility-administered medications prior to visit.     ROS: Review of Systems  Constitutional: Positive for fatigue. Negative for appetite change and unexpected weight change.  HENT: Negative for congestion, nosebleeds, sneezing,  sore throat and trouble swallowing.   Eyes: Negative for itching and visual disturbance.  Respiratory: Negative for cough.   Cardiovascular: Negative for chest pain, palpitations and leg swelling.  Gastrointestinal: Negative for abdominal distention, blood in stool, diarrhea and nausea.  Genitourinary: Negative for frequency and hematuria.  Musculoskeletal: Positive for arthralgias, back pain, gait problem, neck pain and neck stiffness. Negative for joint swelling.  Skin: Negative for rash.  Neurological: Negative for dizziness, tremors, speech difficulty and weakness.  Psychiatric/Behavioral: Negative for agitation, dysphoric mood, sleep disturbance and suicidal ideas. The patient is not nervous/anxious.     Objective:  BP 128/76 (BP Location: Left Arm, Patient Position: Sitting, Cuff Size: Normal)   Pulse (!) 47   Temp 97.6 F (36.4 C) (Oral)   Ht 5\' 11"  (1.803 m)   Wt 174 lb (78.9 kg)   SpO2 98%   BMI 24.27 kg/m   BP Readings from Last 3 Encounters:  03/04/18 128/76  11/20/17 134/68  10/16/17 (!) 142/64    Wt Readings from Last 3 Encounters:  03/04/18 174 lb (78.9 kg)  11/20/17 169 lb (76.7 kg)  10/16/17 166 lb (75.3 kg)    Physical Exam  Constitutional: He is oriented to person, place, and time. He appears well-developed. No distress.  NAD  HENT:  Mouth/Throat:  Oropharynx is clear and moist.  Eyes: Pupils are equal, round, and reactive to light. Conjunctivae are normal.  Neck: Normal range of motion. No JVD present. No thyromegaly present.  Cardiovascular: Normal rate, regular rhythm, normal heart sounds and intact distal pulses. Exam reveals no gallop and no friction rub.  No murmur heard. Pulmonary/Chest: Effort normal and breath sounds normal. No respiratory distress. He has no wheezes. He has no rales. He exhibits no tenderness.  Abdominal: Soft. Bowel sounds are normal. He exhibits no distension and no mass. There is no tenderness. There is no rebound and no  guarding.  Musculoskeletal: Normal range of motion. He exhibits tenderness. He exhibits no edema.  Lymphadenopathy:    He has no cervical adenopathy.  Neurological: He is alert and oriented to person, place, and time. He has normal reflexes. No cranial nerve deficit. He exhibits normal muscle tone. He displays a negative Romberg sign. Coordination abnormal. Gait normal.  Skin: Skin is warm and dry. No rash noted.  Psychiatric: He has a normal mood and affect. His behavior is normal. Judgment and thought content normal.  LS tender, stiff walker  Lab Results  Component Value Date   WBC 8.7 11/20/2017   HGB 13.5 11/20/2017   HCT 39.1 11/20/2017   PLT 236.0 11/20/2017   GLUCOSE 95 11/20/2017   CHOL 130 05/16/2017   TRIG 119.0 05/16/2017   HDL 36.70 (L) 05/16/2017   LDLCALC 70 05/16/2017   ALT 12 05/16/2017   AST 15 05/16/2017   NA 139 11/20/2017   K 4.6 11/20/2017   CL 103 11/20/2017   CREATININE 1.14 11/20/2017   BUN 15 11/20/2017   CO2 29 11/20/2017   TSH 0.51 05/16/2017   PSA 0.01 (L) 02/20/2012   INR 1.30 05/28/2014   HGBA1C 5.7 07/11/2006    No results found.  Assessment & Plan:   There are no diagnoses linked to this encounter.   No orders of the defined types were placed in this encounter.    Follow-up: No follow-ups on file.  Walker Kehr, MD

## 2018-03-04 NOTE — Assessment & Plan Note (Signed)
Chronic COPD - he cont to smoke

## 2018-03-10 ENCOUNTER — Encounter: Payer: Self-pay | Admitting: Cardiovascular Disease

## 2018-03-10 ENCOUNTER — Ambulatory Visit (INDEPENDENT_AMBULATORY_CARE_PROVIDER_SITE_OTHER): Payer: Medicare Other | Admitting: Cardiovascular Disease

## 2018-03-10 VITALS — BP 122/60 | HR 50 | Ht 71.0 in | Wt 175.6 lb

## 2018-03-10 DIAGNOSIS — Z720 Tobacco use: Secondary | ICD-10-CM | POA: Diagnosis not present

## 2018-03-10 DIAGNOSIS — I483 Typical atrial flutter: Secondary | ICD-10-CM | POA: Diagnosis not present

## 2018-03-10 DIAGNOSIS — I5032 Chronic diastolic (congestive) heart failure: Secondary | ICD-10-CM | POA: Diagnosis not present

## 2018-03-10 DIAGNOSIS — I6523 Occlusion and stenosis of bilateral carotid arteries: Secondary | ICD-10-CM

## 2018-03-10 DIAGNOSIS — I2510 Atherosclerotic heart disease of native coronary artery without angina pectoris: Secondary | ICD-10-CM

## 2018-03-10 DIAGNOSIS — I739 Peripheral vascular disease, unspecified: Secondary | ICD-10-CM | POA: Diagnosis not present

## 2018-03-10 NOTE — Patient Instructions (Signed)
Medication Instructions:  Your physician recommends that you continue on your current medications as directed. Please refer to the Current Medication list given to you today.  If you need a refill on your cardiac medications before your next appointment, please call your pharmacy.   Lab work: none If you have labs (blood work) drawn today and your tests are completely normal, you will receive your results only by: Marland Kitchen MyChart Message (if you have MyChart) OR . A paper copy in the mail If you have any lab test that is abnormal or we need to change your treatment, we will call you to review the results.  Testing/Procedures: Your physician has requested that you have an ankle brachial index (ABI). During this test an ultrasound and blood pressure cuff are used to evaluate the arteries that supply the arms and legs with blood. Allow thirty minutes for this exam. There are no restrictions or special instructions.    Follow-Up: At [facility], you and your health needs are our priority.  As part of our continuing mission to provide you with exceptional heart care, we have created designated Provider Care Teams.  These Care Teams include your primary Cardiologist (physician) and Advanced Practice Providers (APPs -  Physician Assistants and Nurse Practitioners) who all work together to provide you with the care you need, when you need it. You will need a follow up appointment in 6 months.  Please call our office 2 months in advance to schedule this appointment.  You may see Lauree Chandler, MD or one of the following Advanced Practice Providers on your designated Care Team:   [place], PA-C Melina Copa, PA-C . Ermalinda Barrios, PA-C  Any Other Special Instructions Will Be Listed Below (If Applicable).

## 2018-03-10 NOTE — Progress Notes (Signed)
Chief Complaint  Patient presents with  . Follow-up    CAD    History of Present Illness: 77 yo male with history of CAD s/p CABG 1993 and redo 2016, PAD, hyperlipidemia, atrial fibrillation/flutter, COPD, ongoing tobacco abuse, prostate cancer s/p prostatectomy/radiation who is here today for cardiac follow up. He has been followed in our office since 2011. I initially followed his PAD only. He has had PTA of the occluded left SFA and stent placement May 2011 followed by atherectomy of his right SFA in June 2011. His PAD has remained stable since the. ABI were normal July 2018. He is known to have moderate carotid artery disease, stable by dopplers July 2018. His CAD remained quiet from cath in 1999 until January 2016 when he was admitted with a NSTEMI and atrial fib with RVR. Cardiac cath with critical left main stenosis, LAD and Circumflex disease. Vein graft to OM occluded and LIMA to LAD to atretic. He developed cardiogenic shock with ventricular fibrillation. PTCA of left main and circumflex was performed. CT surgery did not think he was a CABG candidate. He was then readmitted with a NSTEMI in February 2016 and underwent re-do CABG (RIMA-Intermediate, S-OM, S-PDA, S-dist LAD).He had atrial fib/flutter post-op. He was placed on Eliquis. He underwent TEE guided DCCV March 2016 but converted back to atrial fib after cardioversion.   He is here today for follow up. The patient denies any chest pain, dyspnea, palpitations, lower extremity edema, orthopnea, PND, dizziness, near syncope or syncope. He continues to smoke. He has been under stress caring for his wife who has dementia. He has occasional leg pains but no change over past few years. He has ongoing fatigue but no change.   Primary Care Physician: Cassandria Anger, MD   Past Medical History:  Diagnosis Date  . ALLERGIC RHINITIS   . CAD (coronary artery disease)    CABG x 2 with LIMA to Circumflex and vein graft to PDA  07/13/1991    . Carotid bruit    bilateral  . COPD (chronic obstructive pulmonary disease) ([place])   . Diverticulosis   . History of transient ischemic attack (TIA)   . Hyperlipidemia   . Osteoarthritis   . Peripheral neuropathy   . Personal history of prostate cancer   . PVD (peripheral vascular disease) with claudication ([place])   . Radiation proctitis   . STEMI (ST elevation myocardial infarction) ([place]) 04/29/2014   PTCA Lmain and CFX, in setting of VF/VT arrest and CGS  . Tobacco use disorder, continuous   . Ventral hernia     Past Surgical History:  Procedure Laterality Date  . APPENDECTOMY    . CARDIOVERSION N/A 06/24/2014   Procedure: CARDIOVERSION;  Surgeon: Pixie Casino, MD;  Location: [facility];  Service: Cardiovascular;  Laterality: N/A;  . CORONARY ARTERY BYPASS GRAFT  1992  . CORONARY ARTERY BYPASS GRAFT N/A 05/28/2014   Procedure: REDO CORONARY ARTERY BYPASS GRAFTING (CABG);  Surgeon: Grace Isaac, MD;  Location: [place];  Service: Open Heart Surgery;  Laterality: N/A;  Times 4 using right internal mammary artery to Intermediate artery and endoscopically harvested left saphenous vein to LAD, OM1, and PD coronary arteries.  Marland Kitchen LEFT HEART CATHETERIZATION WITH CORONARY ANGIOGRAM N/A 04/30/2014   Procedure: LEFT HEART CATHETERIZATION WITH CORONARY ANGIOGRAM;  Surgeon: Peter M Martinique, MD; Lmain 90% (s/p PTCA), LAD 80%, CFX 100% (s/p PTCA), RCA OK, LIMA-LAD atretic, SVG-OM 100% (chronic), EF 45%, pt req defib x 13 for VF/VT arrest,  CHB w/ tem pacer, IABP and intubation  . LUMBAR LAMINECTOMY  [id number]    X2  . PROSTATECTOMY  05/2008   Dr. Alinda Money and XRT  . TEE WITHOUT CARDIOVERSION N/A 05/28/2014   Procedure: TRANSESOPHAGEAL ECHOCARDIOGRAM (TEE);  Surgeon: Grace Isaac, MD;  Location: [facility];  Service: Open Heart Surgery;  Laterality: N/A;  . TEE WITHOUT CARDIOVERSION N/A 06/24/2014   Procedure: TRANSESOPHAGEAL ECHOCARDIOGRAM (TEE);  Surgeon: Pixie Casino, MD;  Location: [facility]  ENDOSCOPY;  Service: Cardiovascular;  Laterality: N/A;  . TONSILLECTOMY      Current Outpatient Medications  Medication Sig Dispense Refill  . apixaban (ELIQUIS) 5 MG TABS tablet Take 1 tablet (5 mg total) by mouth 2 (two) times daily. 180 tablet 3  . aspirin EC 81 MG tablet Take 1 tablet (81 mg total) by mouth daily. 90 tablet 3  . atorvastatin (LIPITOR) 40 MG tablet Take 1 tablet (40 mg total) by mouth at bedtime. 90 tablet 3  . fluticasone (FLONASE) 50 MCG/ACT nasal spray USE ONE SPRAY IN EACH NOSTRIL DAILY AS NEEDED FOR ALLERGIES OR RHINITIS 48 g 0  . folic acid (FOLVITE) 1 MG tablet TAKE 1 TABLET DAILY 90 tablet 3  . furosemide (LASIX) 20 MG tablet Take 1 tablet (20 mg total) by mouth every other day. 90 tablet 3  . halobetasol (ULTRAVATE) 0.05 % ointment Apply topically 2 (two) times daily. 100 g 3  . loratadine (CLARITIN) 10 MG tablet Take 10 mg by mouth daily.    . metoprolol tartrate (LOPRESSOR) 50 MG tablet Take 1 tablet (50 mg total) by mouth 2 (two) times daily. 180 tablet 3  . nitroGLYCERIN (NITROSTAT) 0.4 MG SL tablet Place 0.4 mg under the tongue as needed. Chest pain    . pantoprazole (PROTONIX) 40 MG tablet Take 1 tablet (40 mg total) by mouth daily. 90 tablet 3  . potassium chloride (KLOR-CON) 8 MEQ tablet Take 1 tablet (8 mEq total) by mouth daily. 90 tablet 3  . traMADol (ULTRAM) 50 MG tablet Take 1 tablet (50 mg total) by mouth every 6 (six) hours as needed for severe pain. Pain 120 tablet 1  . zolpidem (AMBIEN) 5 MG tablet Take 1 tablet (5 mg total) by mouth at bedtime. 90 tablet 0   No current facility-administered medications for this visit.     Allergies  Allergen Reactions  . Adhesive [Tape] Other (See Comments)    Takes skin off   . Codeine Sulfate Itching and Other (See Comments)    headaches    Social History   Socioeconomic History  . Marital status: Married    Spouse name: Not on file  . Number of children: 3  . Years of education: Not on file  .  Highest education level: Not on file  Occupational History  . Occupation: Retired-self employed    Employer: RETIRED  Social Needs  . Financial resource strain: Not hard at all  . Food insecurity:    Worry: Never true    Inability: Never true  . Transportation needs:    Medical: No    Non-medical: No  Tobacco Use  . Smoking status: Current Every Day Smoker    Years: 40.00    Types: Cigarettes  . Smokeless tobacco: Never Used  Substance and Sexual Activity  . Alcohol use: No  . Drug use: No  . Sexual activity: Not Currently  Lifestyle  . Physical activity:    Days per week: 0 days    Minutes per session:  0 min  . Stress: Not at all  Relationships  . Social connections:    Talks on phone: More than three times a week    Gets together: More than three times a week    Attends religious service: 1 to 4 times per year    Active member of club or organization: Yes    Attends meetings of clubs or organizations: More than 4 times per year    Relationship status: Married  . Intimate partner violence:    Fear of current or ex partner: No    Emotionally abused: No    Physically abused: No    Forced sexual activity: No  Other Topics Concern  . Not on file  Social History Narrative  . Not on file    Family History  Problem Relation Age of Onset  . Heart disease Father   . Coronary artery disease Father   . Lung cancer Sister   . Heart disease Brother   . Hypertension Unknown     Review of Systems:  As stated in the HPI and otherwise negative.   BP 122/60   Pulse (!) 50   Ht 5\' 11"  (1.803 m)   Wt 175 lb 9.6 oz (79.7 kg)   SpO2 98%   BMI 24.49 kg/m   Physical Examination:  General: Well developed, well nourished, NAD  HEENT: OP clear, mucus membranes moist  SKIN: warm, dry. No rashes. Neuro: No focal deficits  Musculoskeletal: Muscle strength 5/5 all ext  Psychiatric: Mood and affect normal  Neck: No JVD, no carotid bruits, no thyromegaly, no lymphadenopathy.    Lungs:Clear bilaterally, no wheezes, rhonci, crackles Cardiovascular: Regular rate and rhythm. No murmurs, gallops or rubs. Abdomen:Soft. Bowel sounds present. Non-tender.  Extremities: No lower extremity edema. Pulses are 2 + in the bilateral DP/PT.  EKG:  EKG is not ordered today. The ekg ordered today demonstrates   Recent Labs: 05/16/2017: ALT 12; TSH 0.51 11/20/2017: BUN 15; Creatinine, Ser 1.14; Hemoglobin 13.5; Platelets 236.0; Potassium 4.6; Sodium 139   Lipid Panel    Component Value Date/Time   CHOL 130 05/16/2017 1157   TRIG 119.0 05/16/2017 1157   TRIG 91 04/22/2006 0854   HDL 36.70 (L) 05/16/2017 1157   CHOLHDL 4 05/16/2017 1157   VLDL 23.8 05/16/2017 1157   LDLCALC 70 05/16/2017 1157     Wt Readings from Last 3 Encounters:  03/10/18 175 lb 9.6 oz (79.7 kg)  03/04/18 174 lb (78.9 kg)  11/20/17 169 lb (76.7 kg)     Other studies Reviewed: Additional studies/ records that were reviewed today include: . Review of the above records demonstrates:    Assessment and Plan:   1. CAD s/p CABG without angina: He has no chest pain. Will continue ASA, statin and beta blocker.      2. PAD: No pain in legs. ABI stable July 2018. Will repeat ABI now.   3. Atrial fibrillation/flutter: He is in sinus today. Continue beta blocker and Eliquis. .   4. Tobacco abuse: Despite having had two open heart procedures, severe PVD and COPD, he continues to smoke and does not wish to stop. Smoking cessation advised today  5. Carotid artery disease: Mild bilateral carotid artery disease by last dopplers in July 2018. Repeat July 2021.    6. Chronic diastolic CHF: QBHA=19% by echo 2016. He uses Lasix 20 mg three days per week. Weight is stable. Continue Lasix.   Current medicines are reviewed at length with the patient today.  The patient does not have concerns regarding medicines.  The following changes have been made:  no change  Labs/ tests ordered today include:   No orders of  the defined types were placed in this encounter.   Disposition:   FU with me in 6 months  Signed, Lauree Chandler, MD 03/10/2018 12:26 PM    [address], [place], [place]  [address] Phone: [phone]; Fax: [phone]

## 2018-03-11 DIAGNOSIS — H35033 Hypertensive retinopathy, bilateral: Secondary | ICD-10-CM | POA: Diagnosis not present

## 2018-03-11 DIAGNOSIS — H35371 Puckering of macula, right eye: Secondary | ICD-10-CM | POA: Diagnosis not present

## 2018-03-11 DIAGNOSIS — Z961 Presence of intraocular lens: Secondary | ICD-10-CM | POA: Diagnosis not present

## 2018-03-11 DIAGNOSIS — H2512 Age-related nuclear cataract, left eye: Secondary | ICD-10-CM | POA: Diagnosis not present

## 2018-03-11 DIAGNOSIS — H33102 Unspecified retinoschisis, left eye: Secondary | ICD-10-CM | POA: Diagnosis not present

## 2018-03-13 ENCOUNTER — Ambulatory Visit: Payer: Medicare Other | Admitting: Cardiovascular Disease

## 2018-03-17 ENCOUNTER — Ambulatory Visit: Payer: Medicare Other | Admitting: Cardiology

## 2018-03-18 ENCOUNTER — Inpatient Hospital Stay (HOSPITAL_COMMUNITY): Admission: RE | Admit: 2018-03-18 | Payer: Medicare Other | Source: Ambulatory Visit

## 2018-03-18 ENCOUNTER — Other Ambulatory Visit: Payer: Self-pay | Admitting: Cardiovascular Disease

## 2018-03-18 DIAGNOSIS — Z95820 Peripheral vascular angioplasty status with implants and grafts: Secondary | ICD-10-CM

## 2018-03-18 DIAGNOSIS — I739 Peripheral vascular disease, unspecified: Secondary | ICD-10-CM

## 2018-04-16 ENCOUNTER — Encounter (INDEPENDENT_AMBULATORY_CARE_PROVIDER_SITE_OTHER): Payer: Self-pay

## 2018-04-16 ENCOUNTER — Ambulatory Visit (HOSPITAL_BASED_OUTPATIENT_CLINIC_OR_DEPARTMENT_OTHER)
Admission: RE | Admit: 2018-04-16 | Discharge: 2018-04-16 | Disposition: A | Discharge status: Home / Self Care | Payer: Medicare Other | Source: Ambulatory Visit | Attending: Cardiovascular Disease | Admitting: Cardiovascular Disease

## 2018-04-16 ENCOUNTER — Ambulatory Visit (HOSPITAL_COMMUNITY)
Admission: RE | Admit: 2018-04-16 | Discharge: 2018-04-16 | Disposition: A | Discharge status: Home / Self Care | Payer: Medicare Other | Source: Ambulatory Visit | Attending: Cardiology | Admitting: Cardiology

## 2018-04-16 DIAGNOSIS — I739 Peripheral vascular disease, unspecified: Secondary | ICD-10-CM | POA: Diagnosis not present

## 2018-04-16 DIAGNOSIS — Z95820 Peripheral vascular angioplasty status with implants and grafts: Secondary | ICD-10-CM | POA: Diagnosis not present

## 2018-04-29 ENCOUNTER — Other Ambulatory Visit: Payer: Self-pay | Admitting: Internal Medicine

## 2018-05-15 ENCOUNTER — Other Ambulatory Visit: Payer: Self-pay | Admitting: Internal Medicine

## 2018-05-25 ENCOUNTER — Other Ambulatory Visit: Payer: Self-pay | Admitting: Internal Medicine

## 2018-05-28 DIAGNOSIS — Z961 Presence of intraocular lens: Secondary | ICD-10-CM | POA: Diagnosis not present

## 2018-05-28 DIAGNOSIS — H2512 Age-related nuclear cataract, left eye: Secondary | ICD-10-CM | POA: Diagnosis not present

## 2018-05-28 DIAGNOSIS — H35371 Puckering of macula, right eye: Secondary | ICD-10-CM | POA: Diagnosis not present

## 2018-07-08 ENCOUNTER — Other Ambulatory Visit: Payer: Self-pay

## 2018-07-08 ENCOUNTER — Encounter: Payer: Self-pay | Admitting: Internal Medicine

## 2018-07-08 ENCOUNTER — Other Ambulatory Visit (INDEPENDENT_AMBULATORY_CARE_PROVIDER_SITE_OTHER): Payer: Medicare Other

## 2018-07-08 ENCOUNTER — Ambulatory Visit (INDEPENDENT_AMBULATORY_CARE_PROVIDER_SITE_OTHER): Payer: Medicare Other | Admitting: Internal Medicine

## 2018-07-08 DIAGNOSIS — I110 Hypertensive heart disease with heart failure: Secondary | ICD-10-CM | POA: Diagnosis not present

## 2018-07-08 DIAGNOSIS — I4892 Unspecified atrial flutter: Secondary | ICD-10-CM | POA: Diagnosis not present

## 2018-07-08 DIAGNOSIS — G8929 Other chronic pain: Secondary | ICD-10-CM

## 2018-07-08 DIAGNOSIS — M5440 Lumbago with sciatica, unspecified side: Secondary | ICD-10-CM

## 2018-07-08 DIAGNOSIS — J410 Simple chronic bronchitis: Secondary | ICD-10-CM

## 2018-07-08 LAB — BASIC METABOLIC PANEL
BUN: 12 mg/dL (ref 6–23)
CO2: 28 mEq/L (ref 19–32)
CREATININE: 1.17 mg/dL (ref 0.40–1.50)
Calcium: 9.4 mg/dL (ref 8.4–10.5)
Chloride: 102 mEq/L (ref 96–112)
GFR: 60.06 mL/min (ref >60.00)
Glucose, Bld: 112 mg/dL — ABNORMAL HIGH (ref 70–99)
Potassium: 4.3 mEq/L (ref 3.5–5.1)
Sodium: 137 mEq/L (ref 135–145)

## 2018-07-08 NOTE — Assessment & Plan Note (Signed)
[facility] at [facility]  Potential benefits of a long term opioids use as well as potential risks (i.e. addiction risk, apnea etc) and complications (i.e. Somnolence, constipation and others) were explained to the patient and were aknowledged.

## 2018-07-08 NOTE — Progress Notes (Signed)
Subjective:  Patient ID: [name], male    DOB: 08-03-1940  Age: 78 y.o. MRN: [id number]  CC: No chief complaint on file.   HPI Ayeden Gladman Catalfamo presents for COPD, LBP, CAD f/u  Outpatient Medications Prior to Visit  Medication Sig Dispense Refill   apixaban (ELIQUIS) 5 MG TABS tablet Take 1 tablet (5 mg total) by mouth 2 (two) times daily. 180 tablet 3   aspirin EC 81 MG tablet Take 1 tablet (81 mg total) by mouth daily. 90 tablet 3   atorvastatin (LIPITOR) 40 MG tablet TAKE 1 TABLET AT BEDTIME 90 tablet 4   fluticasone (FLONASE) 50 MCG/ACT nasal spray USE ONE SPRAY IN EACH NOSTRIL DAILY AS NEEDED FOR ALLERGIES OR RHINITIS 48 g 6   folic acid (FOLVITE) 1 MG tablet TAKE 1 TABLET DAILY 90 tablet 3   furosemide (LASIX) 20 MG tablet Take 1 tablet (20 mg total) by mouth every other day. 90 tablet 3   halobetasol (ULTRAVATE) 0.05 % ointment Apply topically 2 (two) times daily. 100 g 3   loratadine (CLARITIN) 10 MG tablet Take 10 mg by mouth daily.     LORazepam (ATIVAN) 0.5 MG tablet      metoprolol tartrate (LOPRESSOR) 50 MG tablet TAKE 1 TABLET TWICE A DAY 180 tablet 4   nitroGLYCERIN (NITROSTAT) 0.4 MG SL tablet Place 0.4 mg under the tongue as needed. Chest pain     pantoprazole (PROTONIX) 40 MG tablet Take 1 tablet (40 mg total) by mouth daily. 90 tablet 3   potassium chloride (KLOR-CON) 8 MEQ tablet Take 1 tablet (8 mEq total) by mouth daily. 90 tablet 3   traMADol (ULTRAM) 50 MG tablet Take 1 tablet (50 mg total) by mouth every 6 (six) hours as needed for severe pain. Pain 120 tablet 1   zolpidem (AMBIEN) 5 MG tablet Take 1 tablet (5 mg total) by mouth at bedtime. 90 tablet 0   No facility-administered medications prior to visit.     ROS: Review of Systems  Constitutional: Negative for appetite change, fatigue and unexpected weight change.  HENT: Negative for congestion, nosebleeds, sneezing, sore throat and trouble swallowing.   Eyes: Negative for  itching and visual disturbance.  Respiratory: Positive for shortness of breath. Negative for cough.   Cardiovascular: Negative for chest pain, palpitations and leg swelling.  Gastrointestinal: Negative for abdominal distention, blood in stool, diarrhea and nausea.  Genitourinary: Negative for frequency and hematuria.  Musculoskeletal: Positive for arthralgias and back pain. Negative for gait problem, joint swelling and neck pain.  Skin: Negative for rash.  Neurological: Negative for dizziness, tremors, speech difficulty and weakness.  Psychiatric/Behavioral: Negative for agitation, dysphoric mood and sleep disturbance. The patient is not nervous/anxious.     Objective:  BP 118/70 (BP Location: Left Arm, Patient Position: Sitting, Cuff Size: Normal)    Pulse (!) 58    Temp 97.7 F (36.5 C) (Oral)    Ht 5\' 11"  (1.803 m)    Wt 172 lb 1.3 oz (78.1 kg)    SpO2 96%    BMI 24.00 kg/m   BP Readings from Last 3 Encounters:  07/08/18 118/70  03/10/18 122/60  03/04/18 128/76    Wt Readings from Last 3 Encounters:  07/08/18 172 lb 1.3 oz (78.1 kg)  03/10/18 175 lb 9.6 oz (79.7 kg)  03/04/18 174 lb (78.9 kg)    Physical Exam Constitutional:      General: He is not in acute distress.    Appearance:  He is well-developed.     Comments: NAD  Eyes:     Conjunctiva/sclera: Conjunctivae normal.     Pupils: Pupils are equal, round, and reactive to light.  Neck:     Musculoskeletal: Normal range of motion.     Thyroid: No thyromegaly.     Vascular: No JVD.  Cardiovascular:     Rate and Rhythm: Normal rate and regular rhythm.     Heart sounds: Normal heart sounds. No murmur. No friction rub. No gallop.   Pulmonary:     Effort: Pulmonary effort is normal. No respiratory distress.     Breath sounds: Normal breath sounds. No wheezing or rales.  Chest:     Chest wall: No tenderness.  Abdominal:     General: Bowel sounds are normal. There is no distension.     Palpations: Abdomen is soft. There  is no mass.     Tenderness: There is no abdominal tenderness. There is no guarding or rebound.  Musculoskeletal: Normal range of motion.        General: Tenderness present.  Lymphadenopathy:     Cervical: No cervical adenopathy.  Skin:    General: Skin is warm and dry.     Findings: No rash.  Neurological:     Mental Status: He is alert and oriented to person, place, and time.     Cranial Nerves: No cranial nerve deficit.     Motor: No abnormal muscle tone.     Coordination: Coordination abnormal.     Gait: Gait normal.     Deep Tendon Reflexes: Reflexes are normal and symmetric.  Psychiatric:        Behavior: Behavior normal.        Thought Content: Thought content normal.        Judgment: Judgment normal.    LS w/pain walker  Lab Results  Component Value Date   WBC 8.7 11/20/2017   HGB 13.5 11/20/2017   HCT 39.1 11/20/2017   PLT 236.0 11/20/2017   GLUCOSE 95 11/20/2017   CHOL 130 05/16/2017   TRIG 119.0 05/16/2017   HDL 36.70 (L) 05/16/2017   LDLCALC 70 05/16/2017   ALT 12 05/16/2017   AST 15 05/16/2017   NA 139 11/20/2017   K 4.6 11/20/2017   CL 103 11/20/2017   CREATININE 1.14 11/20/2017   BUN 15 11/20/2017   CO2 29 11/20/2017   TSH 0.51 05/16/2017   PSA 0.01 (L) 02/20/2012   INR 1.30 05/28/2014   HGBA1C 5.7 07/11/2006    Vas [place] Abi With/wo Tbi  Result Date: 04/16/2018 LOWER EXTREMITY DOPPLER STUDY Indications: Peripheral artery disease, and Follow-up s/p intervention. High Risk Factors: Current smoker, prior MI, coronary artery disease. Other Factors: Patient denies claudication.  Vascular Interventions: 08/2009, right CIA and EIA angioplasty, right SFA                         atherectomy and, left SFA stent. Comparison Study: 10/2016 ABIs of 1.1 right and 1.1 left Performing Technologist: Pilar Jarvis RDMS, RVT, RDCS  Examination Guidelines: A complete evaluation includes at minimum, Doppler waveform signals and systolic blood pressure reading at the level of  bilateral brachial, anterior tibial, and posterior tibial arteries, when vessel segments are accessible. Bilateral testing is considered an integral part of a complete examination. Photoelectric Plethysmograph (PPG) waveforms and toe systolic pressure readings are included as required and additional duplex testing as needed. Limited examinations for reoccurring indications may be performed as noted.  ABI Findings: +---------+------------------+-----+---------+--------+  Right     Rt Pressure (mmHg) Index Waveform  Comment   +---------+------------------+-----+---------+--------+  Brachial  136                      triphasic           +---------+------------------+-----+---------+--------+  ATA       134                0.98  triphasic           +---------+------------------+-----+---------+--------+  PTA       139                1.01  triphasic           +---------+------------------+-----+---------+--------+  PERO      133                0.97  triphasic           +---------+------------------+-----+---------+--------+  Great Toe 73                 0.53  Normal              +---------+------------------+-----+---------+--------+ +---------+------------------+-----+---------+-------+  Left      Lt Pressure (mmHg) Index Waveform  Comment  +---------+------------------+-----+---------+-------+  Brachial  137                      triphasic          +---------+------------------+-----+---------+-------+  ATA       136                0.99  triphasic          +---------+------------------+-----+---------+-------+  PTA       126                0.92  biphasic           +---------+------------------+-----+---------+-------+  PERO      132                0.96  triphasic          +---------+------------------+-----+---------+-------+  Great Toe 74                 0.54  Normal             +---------+------------------+-----+---------+-------+ +-------+-----------+-----------+------------+------------+  ABI/TBI Today's ABI Today's  TBI Previous ABI Previous TBI  +-------+-----------+-----------+------------+------------+  Right   1.01        0.53        1.1          1.1           +-------+-----------+-----------+------------+------------+  Left    0.99        0.54        1.1          0.81          +-------+-----------+-----------+------------+------------+ Bilateral ABIs appear essentially unchanged compared to prior study on 7/18. Bilateral TBIs appear decreased compared to prior study on 7/18.  Summary:  *See table(s) above for measurements and observations.  Suggest follow up study in 12 months. Electronically signed by Ena Dawley MD on 04/16/2018 at 1:04:54 PM.    Final    Vas [place] Lower Extremity Arterial Duplex  Result Date: 04/16/2018 LOWER EXTREMITY ARTERIAL DUPLEX STUDY Indications: Peripheral artery disease, and Follow-up s/p intervention. Other Factors: Denies claudication.  Vascular Interventions: 08/2009, angioplasty of right CIA and EIA, atherectomy of  right SFA and, stenting of left SFA. Current ABI:            Right 1.01/ left 0.99 Comparison Study: 10/2016, mildly elevated right proximal SFA velocity Performing Technologist: Pilar Jarvis RDMS, RVT, RDCS  Examination Guidelines: A complete evaluation includes B-mode imaging, spectral Doppler, color Doppler, and power Doppler as needed of all accessible portions of each vessel. Bilateral testing is considered an integral part of a complete examination. Limited examinations for reoccurring indications may be performed as noted.  Right Duplex Findings: +----------+--------+-----+--------+--------+--------+             PSV cm/s Ratio Stenosis Waveform Comments  +----------+--------+-----+--------+--------+--------+  CIA Prox   182                                        +----------+--------+-----+--------+--------+--------+  CIA Mid    148                                        +----------+--------+-----+--------+--------+--------+  CIA Distal 219                                         +----------+--------+-----+--------+--------+--------+  EIA Prox   102                                        +----------+--------+-----+--------+--------+--------+  EIA Mid    78                                         +----------+--------+-----+--------+--------+--------+  CFA Prox   98                      biphasic           +----------+--------+-----+--------+--------+--------+  DFA        195                                        +----------+--------+-----+--------+--------+--------+  SFA Prox   160                     biphasic           +----------+--------+-----+--------+--------+--------+  SFA Mid    82                      biphasic           +----------+--------+-----+--------+--------+--------+  SFA Distal 163                     biphasic           +----------+--------+-----+--------+--------+--------+  POP Prox   49                      biphasic           +----------+--------+-----+--------+--------+--------+  POP Distal 31  biphasic           +----------+--------+-----+--------+--------+--------+  Left Duplex Findings: +----------+--------+-----+--------+--------+----------------------+             PSV cm/s Ratio Stenosis Waveform Comments                +----------+--------+-----+--------+--------+----------------------+  CIA Mid    144                                                      +----------+--------+-----+--------+--------+----------------------+  CIA Distal 191                                                      +----------+--------+-----+--------+--------+----------------------+  EIA Prox   155                                                      +----------+--------+-----+--------+--------+----------------------+  EIA Mid    95                                                       +----------+--------+-----+--------+--------+----------------------+  EIA Distal 117                                                       +----------+--------+-----+--------+--------+----------------------+  CFA Prox   153                     biphasic                         +----------+--------+-----+--------+--------+----------------------+  DFA        109                     biphasic                         +----------+--------+-----+--------+--------+----------------------+  SFA Prox   139                     biphasic                         +----------+--------+-----+--------+--------+----------------------+  SFA Mid    132                     biphasic See stent measurements  +----------+--------+-----+--------+--------+----------------------+  SFA Distal 92                      biphasic                         +----------+--------+-----+--------+--------+----------------------+  POP Prox  72                      biphasic                         +----------+--------+-----+--------+--------+----------------------+  POP Distal 35                      biphasic                         +----------+--------+-----+--------+--------+----------------------+  Left Stent(s): +---------------+--------+--------------+--------+--------+  mid SFA         PSV cm/s Stenosis       Waveform Comments  +---------------+--------+--------------+--------+--------+  Prox to Stent   132      1-49% stenosis biphasic           +---------------+--------+--------------+--------+--------+  Proximal Stent  114      1-49% stenosis biphasic           +---------------+--------+--------------+--------+--------+  Mid Stent       109      1-49% stenosis biphasic           +---------------+--------+--------------+--------+--------+  Distal Stent    104      1-49% stenosis biphasic           +---------------+--------+--------------+--------+--------+  Distal to Stent 114      1-49% stenosis biphasic           +---------------+--------+--------------+--------+--------+  Aorta: +--------+-------+----------+----------+--------+--------+-----+           AP (cm) Trans (cm) PSV  (cm/s) Waveform Thrombus Shape  +--------+-------+----------+----------+--------+--------+-----+  Proximal                    121                                 +--------+-------+----------+----------+--------+--------+-----+  Distal                      115                                 +--------+-------+----------+----------+--------+--------+-----+  Summary: Right: Near normal examination. Left: Near normal examination. Patent stent with no evidence of stenosis in the mid SFA artery.  See table(s) above for measurements and observations. Suggest follow up study in 12 months. Electronically signed by Ena Dawley MD on 04/16/2018 at 1:04:12 PM.    Final    Vas US Aorta/ivc/iliacs  Result Date: 04/16/2018 ABDOMINAL AORTA STUDY Indications: S/P iliac artery angioplasty Other Factors: Patient denies claudication. Vascular Interventions: 08/2009, PTA of right CIA and EIA, atherectomy of right                         SFA and, left SFA stent.  Comparison Study: Normal aorta and iliac artery velocities, 10/2016 Performing Technologist: Pilar Jarvis RDMS, RVT, RDCS  Examination Guidelines: A complete evaluation includes B-mode imaging, spectral Doppler, color Doppler, and power Doppler as needed of all accessible portions of each vessel. Bilateral testing is considered an integral part of a complete examination. Limited examinations for reoccurring indications may be performed as noted.  Abdominal Aorta Findings: +-------------+-------+----------+----------+--------+--------+--------+  Location      AP (cm) Trans (cm) PSV (cm/s) Waveform Thrombus Comments  +-------------+-------+----------+----------+--------+--------+--------+  Proximal  121                                    +-------------+-------+----------+----------+--------+--------+--------+  Distal                           115                                    +-------------+-------+----------+----------+--------+--------+--------+  RT  CIA Prox                      182                                    +-------------+-------+----------+----------+--------+--------+--------+  RT CIA Mid                       148        biphasic                    +-------------+-------+----------+----------+--------+--------+--------+  RT CIA Distal                    219        biphasic                    +-------------+-------+----------+----------+--------+--------+--------+  RT EIA Prox                      102        biphasic                    +-------------+-------+----------+----------+--------+--------+--------+  RT EIA Mid                       78         biphasic                    +-------------+-------+----------+----------+--------+--------+--------+  RT EIA Distal                    154        biphasic                    +-------------+-------+----------+----------+--------+--------+--------+  LT CIA Prox                      235        biphasic                    +-------------+-------+----------+----------+--------+--------+--------+  LT CIA Mid                       144        biphasic                    +-------------+-------+----------+----------+--------+--------+--------+  LT CIA Distal                    191        biphasic                    +-------------+-------+----------+----------+--------+--------+--------+  LT EIA Prox  155        biphasic                    +-------------+-------+----------+----------+--------+--------+--------+  LT EIA Mid                       95         biphasic                    +-------------+-------+----------+----------+--------+--------+--------+  LT EIA Distal                    117        biphasic                    +-------------+-------+----------+----------+--------+--------+--------+ IVC/Iliac Findings: +-------+------+--------+--------+    IVC   Patent Thrombus Comments  +-------+------+--------+--------+  IVC Mid patent                    +-------+------+--------+--------+   Summary: Abdominal Aorta: No evidence of an abdominal aortic aneurysm was visualized. The largest aortic measurement is 1.7 cm. Stenosis: +------------------+-------------+  Location           Stenosis       +------------------+-------------+  Right Common Iliac >50% stenosis  +------------------+-------------+  Left Common Iliac  >50% stenosis  +------------------+-------------+  *See table(s) above for measurements and observations. Suggest follow up study in 12 months.  Electronically signed by Ena Dawley MD on 04/16/2018 at 1:04:26 PM.    Final     Assessment & Plan:   There are no diagnoses linked to this encounter.   No orders of the defined types were placed in this encounter.    Follow-up: No follow-ups on file.  Walker Kehr, MD

## 2018-07-08 NOTE — Assessment & Plan Note (Signed)
Eliquis 

## 2018-07-08 NOTE — Assessment & Plan Note (Signed)
COVID 19 prevention discussed

## 2018-07-08 NOTE — Assessment & Plan Note (Signed)
Toprol Lasix

## 2018-07-10 ENCOUNTER — Other Ambulatory Visit: Payer: Self-pay | Admitting: Internal Medicine

## 2018-07-29 ENCOUNTER — Other Ambulatory Visit: Payer: Self-pay | Admitting: Internal Medicine

## 2018-08-09 ENCOUNTER — Other Ambulatory Visit: Payer: Self-pay | Admitting: Internal Medicine

## 2018-08-15 DIAGNOSIS — C61 Malignant neoplasm of prostate: Secondary | ICD-10-CM | POA: Diagnosis not present

## 2018-08-19 ENCOUNTER — Other Ambulatory Visit (HOSPITAL_COMMUNITY): Payer: Self-pay | Admitting: Cardiovascular Disease

## 2018-08-19 DIAGNOSIS — I739 Peripheral vascular disease, unspecified: Secondary | ICD-10-CM

## 2018-08-20 DIAGNOSIS — C61 Malignant neoplasm of prostate: Secondary | ICD-10-CM | POA: Diagnosis not present

## 2018-09-04 ENCOUNTER — Encounter: Payer: Self-pay | Admitting: Physician Assistant

## 2018-09-04 ENCOUNTER — Telehealth: Payer: Self-pay | Admitting: Physician Assistant

## 2018-09-04 NOTE — Progress Notes (Addendum)
Virtual Visit via Telephone Note   This visit type was conducted due to national recommendations for restrictions regarding the COVID-19 Pandemic (e.g. social distancing) in an effort to limit this patient's exposure and mitigate transmission in our community.  Due to his co-morbid illnesses, this patient is at least at moderate risk for complications without adequate follow up.  This format is felt to be most appropriate for this patient at this time.  The patient did not have access to video technology/had technical difficulties with video requiring transitioning to audio format only (telephone).  All issues noted in this document were discussed and addressed.  No physical exam could be performed with this format.  Please refer to the patient's chart for his  consent to telehealth for [facility].   Date:  09/05/2018   ID:  [name], DOB 1941-02-08, MRN [id number]  Patient Location: Home Provider Location: Home  PCP:  Cassandria Anger, MD  Cardiologist:  Lauree Chandler, MD  Electrophysiologist:  None   Evaluation Performed:  Follow-Up Visit  Chief Complaint:  F/u CAD  History of Present Illness:    [name] is a 78 y.o. male with CAD s/p CABG 1993 and redo 2016, chronic combined CHF, PAD, carotid artery disease, hyperlipidemia, atrial fibrillation/flutter, COPD, ongoing tobacco abuse, prostate cancer s/p prostatectomy/radiation (metastasized to lymph node) who is here today for cardiac follow up. Dr. Angelena Form intially met him when following his PAD. He has had PTA of the occluded left SFA and stent placement May 2011 followed by atherectomy of his right SFA in June 2011. His PAD has remained stable since then. His CAD remained quiet from cath in 1999 until January 2016 when he was admitted with a NSTEMI and atrial fib with RVR. Cardiac cath with critical left main stenosis, LAD and Circumflex disease. Vein graft to OM occluded and LIMA to LAD to atretic.  He developed cardiogenic shock with ventricular fibrillation. PTCA of left main and circumflex was performed. CT surgery did not think he was a CABG candidate. He was then readmitted with a NSTEMI in February 2016 and underwent re-do CABG (RIMA-Intermediate, S-OM, S-PDA, S-dist LAD). He had atrial fib/flutter post-op. He was placed on Eliquis. He underwent TEE guided DCCV March 2016 but converted back to atrial fib after cardioversion. He was in NSR at last OV 03/2018. LE arterial studies 04/2018 were near normal. Carotid duplex 10/2016 showed 1-39% BICA, f/u planned 2021. Despite having had two open heart procedures, severe PVD and COPD, he continues to smoke and does not wish to stop. Last labs 06/2018 with K 4.3, Cr 1.17 c/w mild CKD stage II, CBC in 11/2017 with Hgb 13.5, lipid 05/2017 with LDL 70. Last echo report available from 06/2014 (TEE) showed EF 45%, mild MR.  The patient reports he is feeling just fine without any CP, SOB, orthopnea, edema, weight gain or bleeding. He avoids salt. When discussing fluid intake he does mention he drinks 8 cups of coffee each day and we discussed reduction of this especially since it's caffeinated. No claudication. The patient does not have symptoms concerning for COVID-19 infection (fever, chills, cough, or new shortness of breath).     Past Medical History:  Diagnosis Date   ALLERGIC RHINITIS    Atrial flutter ([place])    CAD (coronary artery disease)    CAD s/p CABG 1993 and redo 2016,   Carotid artery disease (HCC)    bilateral   Chronic combined systolic and diastolic CHF (congestive heart  failure) (HCC)    CKD (chronic kidney disease), stage II    COPD (chronic obstructive pulmonary disease) (HCC)    Diverticulosis    Hyperlipidemia    Osteoarthritis    PAF (paroxysmal atrial fibrillation) (HCC)    Peripheral neuropathy    Personal history of prostate cancer    PVD (peripheral vascular disease) with claudication (HCC)    Radiation  proctitis    STEMI (ST elevation myocardial infarction) ([place]) 04/29/2014   PTCA Lmain and CFX, in setting of VF/VT arrest and CGS   Tobacco use disorder, continuous    Ventral hernia    Past Surgical History:  Procedure Laterality Date   APPENDECTOMY     CARDIOVERSION N/A 06/24/2014   Procedure: CARDIOVERSION;  Surgeon: Pixie Casino, MD;  Location: [place];  Service: Cardiovascular;  Laterality: N/A;   CORONARY ARTERY BYPASS GRAFT  1992   CORONARY ARTERY BYPASS GRAFT N/A 05/28/2014   Procedure: REDO CORONARY ARTERY BYPASS GRAFTING (CABG);  Surgeon: Grace Isaac, MD;  Location: [facility];  Service: Open Heart Surgery;  Laterality: N/A;  Times 4 using right internal mammary artery to Intermediate artery and endoscopically harvested left saphenous vein to LAD, OM1, and PD coronary arteries.   LEFT HEART CATHETERIZATION WITH CORONARY ANGIOGRAM N/A 04/30/2014   Procedure: LEFT HEART CATHETERIZATION WITH CORONARY ANGIOGRAM;  Surgeon: Peter M Martinique, MD; Lmain 90% (s/p PTCA), LAD 80%, CFX 100% (s/p PTCA), RCA OK, LIMA-LAD atretic, SVG-OM 100% (chronic), EF 45%, pt req defib x 13 for VF/VT arrest, CHB w/ tem pacer, IABP and intubation   LUMBAR LAMINECTOMY  7425,9563    X2   PROSTATECTOMY  05/2008   Dr. Alinda Money and XRT   TEE WITHOUT CARDIOVERSION N/A 05/28/2014   Procedure: TRANSESOPHAGEAL ECHOCARDIOGRAM (TEE);  Surgeon: Grace Isaac, MD;  Location: [place];  Service: Open Heart Surgery;  Laterality: N/A;   TEE WITHOUT CARDIOVERSION N/A 06/24/2014   Procedure: TRANSESOPHAGEAL ECHOCARDIOGRAM (TEE);  Surgeon: Pixie Casino, MD;  [facility] ENDOSCOPY;  Service: Cardiovascular;  Laterality: N/A;   TONSILLECTOMY       Current Meds  Medication Sig   apixaban (ELIQUIS) 5 MG TABS tablet Take 1 tablet (5 mg total) by mouth 2 (two) times daily.   aspirin EC 81 MG tablet Take 1 tablet (81 mg total) by mouth daily.   atorvastatin (LIPITOR) 40 MG tablet TAKE 1 TABLET AT BEDTIME    fluticasone (FLONASE) 50 MCG/ACT nasal spray USE ONE SPRAY IN EACH NOSTRIL DAILY AS NEEDED FOR ALLERGIES OR RHINITIS   folic acid (FOLVITE) 1 MG tablet TAKE 1 TABLET DAILY   furosemide (LASIX) 20 MG tablet TAKE 1 TABLET EVERY OTHER DAY   halobetasol (ULTRAVATE) 0.05 % ointment Apply topically 2 (two) times daily.   loratadine (CLARITIN) 10 MG tablet Take 10 mg by mouth daily.   LORazepam (ATIVAN) 0.5 MG tablet    metoprolol tartrate (LOPRESSOR) 50 MG tablet TAKE 1 TABLET TWICE A DAY   nitroGLYCERIN (NITROSTAT) 0.4 MG SL tablet Place 0.4 mg under the tongue as needed. Chest pain   pantoprazole (PROTONIX) 40 MG tablet TAKE 1 TABLET DAILY   potassium chloride (KLOR-CON) 8 MEQ tablet TAKE 1 TABLET DAILY   traMADol (ULTRAM) 50 MG tablet Take 1 tablet (50 mg total) by mouth every 6 (six) hours as needed for severe pain. Pain   zolpidem (AMBIEN) 5 MG tablet Take 1 tablet (5 mg total) by mouth at bedtime.     Allergies:   Adhesive [tape] and  Codeine sulfate   Social History   Tobacco Use   Smoking status: Current Every Day Smoker    Years: 40.00    Types: Cigarettes   Smokeless tobacco: Never Used  Substance Use Topics   Alcohol use: No   Drug use: No     Family Hx: The patient's family history includes Coronary artery disease in his father; Heart disease in his brother and father; Hypertension in his unknown relative; Lung cancer in his sister.  ROS:   Please see the history of present illness.     All other systems reviewed and are negative.   Prior CV studies:   Most recent pertinent cardiac studies are outlined above.   Labs/Other Tests and Data Reviewed:    EKG:  An ECG dated 10/03/17 was personally reviewed today and demonstrated:  sinus bradycardia 56bpm, left axis deviation, NSST changes  Recent Labs: 11/20/2017: Hemoglobin 13.5; Platelets 236.0 07/08/2018: BUN 12; Creatinine, Ser 1.17; Potassium 4.3; Sodium 137   Recent Lipid Panel Lab Results   Component Value Date/Time   CHOL 130 05/16/2017 11:57 AM   TRIG 119.0 05/16/2017 11:57 AM   TRIG 91 04/22/2006 08:54 AM   HDL 36.70 (L) 05/16/2017 11:57 AM   CHOLHDL 4 05/16/2017 11:57 AM   LDLCALC 70 05/16/2017 11:57 AM    Wt Readings from Last 3 Encounters:  09/05/18 174 lb (78.9 kg)  07/08/18 172 lb 1.3 oz (78.1 kg)  03/10/18 175 lb 9.6 oz (79.7 kg)     Objective:    Vital Signs:  Ht 6' (1.829 m)    Wt 174 lb (78.9 kg)    BMI 23.60 kg/m    VITAL SIGNS:  reviewed  General - male in no acute distress Pulm - No labored breathing, no coughing during visit, no audible wheezing, speaking in full sentences Neuro - A+Ox3, no slurred speech, answers questions appropriately Psych - calm affect     ASSESSMENT & PLAN:    1. CAD - asymptomatic. He has been maintained on ASA by primary cardiologist I suspect due to his extensive CAD history. Bleeding precautions reviewed. Continue BB, statin. He is due for lipids. See below for lab discussion. 2. PAD and Carotid disease - the last vascular study suggested f/u in 12 months which would be around 04/2019. However, given that he is asymptomatic I would suggest the timing be reviewed when he returns to the office in 6 months. He also would have f/u carotid duplex would be due around 10/2019. We are still quite a ways away from that so perhaps the two could be combined for convenience moving forward. He will return to see Dr. Angelena Form in 02/2019. 3. Chronic combined CHF - reports no symptoms or signs of volume overload. Reviewed 2g sodium restriction, 2L fluid restriction, daily weights with patient. Discussed reduction in caffeine. Continue present regimen. 4. H/o paroxysmal atrial fib/flutter - clinically asymptomatic at this time. He has history of paroxysms which have been managed moreso with rate control/symptom strategy rather than maintenance of NSR. He does not have vitals check at home today but last VS 06/2018 were normal. Will have nurse  call him post visit to go over some helpful info on obtaining a BP cuff. Continue Eliquis. He is due for a CBC - given dual therapy with ASA and Eliquis would be helpful to have q6 months. He had bloodwork in 06/2018 but this was a BMET. I discussed options for repeating with him, including PCP, [facility] in-home draw, or obtaining  at our lab. He prefers to wait until he sees his PCP in a few weeks - I have suggested at that time he get a CMET, CBC and lipids. A copy of this note will be CC'd to primary care per his request. Appreciate their help obtaining at time of planned routine f/u.  COVID-19 Education: The signs and symptoms of COVID-19 were discussed with the patient and how to seek care for testing (follow up with PCP or arrange E-visit).  The importance of social distancing was discussed today.  Time:   Today, I have spent 18 minutes with the patient with telehealth technology discussing the above problems.     Medication Adjustments/Labs and Tests Ordered: Current medicines are reviewed at length with the patient today.  Concerns regarding medicines are outlined above.   Disposition:  Follow up Dr. Angelena Form in 6 months in office  Signed, Charlie Pitter, PA-C  09/05/2018 1:54 PM    [place]

## 2018-09-04 NOTE — Telephone Encounter (Signed)
Patient set up for MyChart? DECLINED  Is patient using Smartphone/computer/tablet? NO  Did audio/video work?  Does patient need telephone visit?* YES  Best phone number to use? [phone]  Special Instructions? DOES NOT HAVE SCALE OR BP CUFF      Virtual Visit Pre-Appointment Phone Call  "(Name), I am calling you today to discuss your upcoming appointment. We are currently trying to limit exposure to the virus that causes COVID-19 by seeing patients at home rather than in the office."  1. "What is the BEST phone number to call the day of the visit?" - include this in appointment notes  2. Do you have or have access to (through a family member/friend) a smartphone with video capability that we can use for your visit?" a. If yes - list this number in appt notes as cell (if different from BEST phone #) and list the appointment type as a VIDEO visit in appointment notes b. If no - list the appointment type as a PHONE visit in appointment notes  3. Confirm consent - "In the setting of the current Covid19 crisis, you are scheduled for a (phone or video) visit with your provider on (date) at (time).  Just as we do with many in-office visits, in order for you to participate in this visit, we must obtain consent.  If you'd like, I can send this to your mychart (if signed up) or email for you to review.  Otherwise, I can obtain your verbal consent now.  All virtual visits are billed to your insurance company just like a normal visit would be.  By agreeing to a virtual visit, we'd like you to understand that the technology does not allow for your provider to perform an examination, and thus may limit your provider's ability to fully assess your condition. If your provider identifies any concerns that need to be evaluated in person, we will make arrangements to do so.  Finally, though the technology is pretty good, we cannot assure that it will always work on either your or our end, and in the  setting of a video visit, we may have to convert it to a phone-only visit.  In either situation, we cannot ensure that we have a secure connection.  Are you willing to proceed?" STAFF: Did the patient verbally acknowledge consent to telehealth visit? Document YES/NO here:  YES  4. Advise patient to be prepared - "Two hours prior to your appointment, go ahead and check your blood pressure, pulse, oxygen saturation, and your weight (if you have the equipment to check those) and write them all down. When your visit starts, your provider will ask you for this information. If you have an Apple Watch or Kardia device, please plan to have heart rate information ready on the day of your appointment. Please have a pen and paper handy nearby the day of the visit as well."  5. Give patient instructions for MyChart download to smartphone OR Doximity/[email or web address] as below if video visit (depending on what platform provider is using)  6. Inform patient they will receive a phone call 15 minutes prior to their appointment time (may be from unknown caller ID) so they should be prepared to answer    TELEPHONE CALL NOTE  [name] has been deemed a candidate for a follow-up tele-health visit to limit community exposure during the Covid-19 pandemic. I spoke with the patient via phone to ensure availability of phone/video source, confirm preferred email & phone number, and discuss instructions and  expectations.  I reminded [name] to be prepared with any vital sign and/or heart rhythm information that could potentially be obtained via home monitoring, at the time of his visit. I reminded [name] to expect a phone call prior to his visit.  [name] 09/04/2018 3:14 PM   INSTRUCTIONS FOR DOWNLOADING THE MYCHART APP TO SMARTPHONE  - The patient must first make sure to have activated MyChart and know their login information - If Apple, go to [employer] and type in MyChart in the  search bar and download the app. If Android, ask patient to go to [employer] and type in [place] in the search bar and download the app. The app is free but as with any other app downloads, their phone may require them to verify saved payment information or Apple/Android password.  - The patient will need to then log into the app with their MyChart username and password, and select [facility] as their healthcare provider to link the account. When it is time for your visit, go to the MyChart app, find appointments, and click Begin Video Visit. Be sure to Select Allow for your device to access the Microphone and Camera for your visit. You will then be connected, and your provider will be with you shortly.  **If they have any issues connecting, or need assistance please contact MyChart service desk [phone])**  **If using a computer, in order to ensure the best quality for their visit they will need to use either of the following Internet Browsers: [employer], or Google Chrome**  IF USING DOXIMITY or DOXY.ME - The patient will receive a link just prior to their visit by text.     FULL LENGTH CONSENT FOR TELE-HEALTH VISIT   I hereby voluntarily request, consent and authorize [facility] and its employed or contracted physicians, physician assistants, nurse practitioners or other licensed health care professionals (the Practitioner), to provide me with telemedicine health care services (the Services") as deemed necessary by the treating Practitioner. I acknowledge and consent to receive the Services by the Practitioner via telemedicine. I understand that the telemedicine visit will involve communicating with the Practitioner through live audiovisual communication technology and the disclosure of certain medical information by electronic transmission. I acknowledge that I have been given the opportunity to request an in-person assessment or other available alternative prior  to the telemedicine visit and am voluntarily participating in the telemedicine visit.  I understand that I have the right to withhold or withdraw my consent to the use of telemedicine in the course of my care at any time, without affecting my right to future care or treatment, and that the Practitioner or I may terminate the telemedicine visit at any time. I understand that I have the right to inspect all information obtained and/or recorded in the course of the telemedicine visit and may receive copies of available information for a reasonable fee.  I understand that some of the potential risks of receiving the Services via telemedicine include:   Delay or interruption in medical evaluation due to technological equipment failure or disruption;  Information transmitted may not be sufficient (e.g. poor resolution of images) to allow for appropriate medical decision making by the Practitioner; and/or   In rare instances, security protocols could fail, causing a breach of personal health information.  Furthermore, I acknowledge that it is my responsibility to provide information about my medical history, conditions and care that is complete and accurate to the  best of my ability. I acknowledge that Practitioner's advice, recommendations, and/or decision may be based on factors not within their control, such as incomplete or inaccurate data provided by me or distortions of diagnostic images or specimens that may result from electronic transmissions. I understand that the practice of medicine is not an exact science and that Practitioner makes no warranties or guarantees regarding treatment outcomes. I acknowledge that I will receive a copy of this consent concurrently upon execution via email to the email address I last provided but may also request a printed copy by calling the office of [facility].    I understand that my insurance will be billed for this visit.   I have read or had this consent read  to me.  I understand the contents of this consent, which adequately explains the benefits and risks of the Services being provided via telemedicine.   I have been provided ample opportunity to ask questions regarding this consent and the Services and have had my questions answered to my satisfaction.  I give my informed consent for the services to be provided through the use of telemedicine in my medical care  By participating in this telemedicine visit I agree to the above.

## 2018-09-05 ENCOUNTER — Telehealth: Payer: Self-pay | Admitting: *Deleted

## 2018-09-05 ENCOUNTER — Other Ambulatory Visit: Payer: Self-pay

## 2018-09-05 ENCOUNTER — Encounter: Payer: Self-pay | Admitting: Physician Assistant

## 2018-09-05 ENCOUNTER — Telehealth (INDEPENDENT_AMBULATORY_CARE_PROVIDER_SITE_OTHER): Payer: Medicare Other | Admitting: Physician Assistant

## 2018-09-05 VITALS — Ht 72.0 in | Wt 174.0 lb

## 2018-09-05 DIAGNOSIS — I480 Paroxysmal atrial fibrillation: Secondary | ICD-10-CM

## 2018-09-05 DIAGNOSIS — I2510 Atherosclerotic heart disease of native coronary artery without angina pectoris: Secondary | ICD-10-CM

## 2018-09-05 DIAGNOSIS — I739 Peripheral vascular disease, unspecified: Secondary | ICD-10-CM

## 2018-09-05 DIAGNOSIS — I6523 Occlusion and stenosis of bilateral carotid arteries: Secondary | ICD-10-CM

## 2018-09-05 DIAGNOSIS — I5042 Chronic combined systolic (congestive) and diastolic (congestive) heart failure: Secondary | ICD-10-CM

## 2018-09-05 NOTE — Patient Instructions (Signed)
Medication Instructions:  Your physician recommends that you continue on your current medications as directed. Please refer to the Current Medication list given to you today.  If you need a refill on your cardiac medications before your next appointment, please call your pharmacy.   Lab work: None ordered  If you have labs (blood work) drawn today and your tests are completely normal, you will receive your results only by: Marland Kitchen MyChart Message (if you have MyChart) OR . A paper copy in the mail If you have any lab test that is abnormal or we need to change your treatment, we will call you to review the results.   Testing/Procedures: None ordered   Follow-Up: At [facility], you and your health needs are our priority.  As part of our continuing mission to provide you with exceptional heart care, we have created designated Provider Care Teams.  These Care Teams include your primary Cardiologist (physician) and Advanced Practice Providers (APPs -  Physician Assistants and Nurse Practitioners) who all work together to provide you with the care you need, when you need it. You will need a follow up appointment in 6 .  Please call our office 2 months in advance to schedule this appointment.  You may see Lauree Chandler, MD or one of the following Advanced Practice Providers on your designated Care Team:   [place], PA-C Melina Copa, PA-C . Ermalinda Barrios, PA-C  Any Other Special Instructions Will Be Listed Below (If Applicable). When you go for your routine primary care follow-up, remember to talk to him about labs. We would recommend you have a CMET, CBC, and fasting lipid profile in addition to whatever other labwork he would have planned.    If you notice any bleeding such as blood in stool, black tarry stools, blood in urine, nosebleeds or any other unusual bleeding, call your doctor immediately. It is not normal to have this kind of bleeding while on a blood thinner and usually  indicates there is an underlying problem with one of your body systems that needs to be checked out.   For patients with fluid retention we give them these special instructions:  1. Follow a low-salt diet - you are allowed no more than 2,000mg  of sodium per day. Watch your fluid intake. In general, you should not be taking in more than 2 liters of fluid per day (no more than 8 glasses per day). This includes sources of water in foods like soup, coffee, tea, milk, etc.  2. Weigh yourself on the same scale at same time of day and keep a log.  3. Call your doctor: (Anytime you feel any of the following symptoms)  - 3lb weight gain overnight or 5lb within a few days  - Shortness of breath, with or without a dry hacking cough  - Swelling in the hands, feet or stomach  - If you have to sleep on extra pillows at night in order to breathe

## 2018-09-05 NOTE — Telephone Encounter (Signed)
-----   Message from Charlie Pitter, [place] sent at 09/05/2018  2:20 PM EDT ----- Regarding: BP info [place], can you call Mr. Trudell and just let him know as a general FYI that it would be helpful to have a BP cuff at home for him to periodically monitor given his cardiac history? That way if he ever runs into acute symptoms he can give [place] that information as well - you can relay this info and just tell him I wanted you do to go over some simple instructions about getting one - "Please get a blood pressure cuff that goes on your arm. The wrist ones can be inaccurate. If possible, try to select one that also reports your heart rate. To check your blood pressure, choose a time about 3 hours after taking your blood pressure medicines. Remain seated in a chair for 5 minutes quietly beforehand, then check it. When you get a cuff, please get those readings and call us/send in MyChart message with them for our review. "

## 2018-09-05 NOTE — Telephone Encounter (Signed)
Call placed to pt re: message below.  Left a message for pt to call back.  

## 2018-09-09 NOTE — Telephone Encounter (Signed)
Call placed to pt re: phone note below.  Pt has been made aware that Dayna would like for him to get a bp cuff, if he hasn't already got one.  Preferably, one that goes on his arm and not his wrist, and one that gives pr as well.  Monitor his bp, 3 hours after he has taken his medications, at least after 5 mins sitting still, and call us next week with some readings, as pt doesn't have mychart. Pt agreed and stated he will do it and if he had any questions, he will call us back.

## 2018-10-07 ENCOUNTER — Encounter: Payer: Self-pay | Admitting: Internal Medicine

## 2018-10-07 ENCOUNTER — Ambulatory Visit (INDEPENDENT_AMBULATORY_CARE_PROVIDER_SITE_OTHER): Payer: Medicare Other | Admitting: Internal Medicine

## 2018-10-07 ENCOUNTER — Other Ambulatory Visit: Payer: Self-pay

## 2018-10-07 DIAGNOSIS — I6523 Occlusion and stenosis of bilateral carotid arteries: Secondary | ICD-10-CM

## 2018-10-07 DIAGNOSIS — J410 Simple chronic bronchitis: Secondary | ICD-10-CM

## 2018-10-07 DIAGNOSIS — G8929 Other chronic pain: Secondary | ICD-10-CM

## 2018-10-07 DIAGNOSIS — E876 Hypokalemia: Secondary | ICD-10-CM | POA: Diagnosis not present

## 2018-10-07 DIAGNOSIS — M5440 Lumbago with sciatica, unspecified side: Secondary | ICD-10-CM

## 2018-10-07 DIAGNOSIS — Z9861 Coronary angioplasty status: Secondary | ICD-10-CM

## 2018-10-07 DIAGNOSIS — I2510 Atherosclerotic heart disease of native coronary artery without angina pectoris: Secondary | ICD-10-CM | POA: Diagnosis not present

## 2018-10-07 NOTE — Progress Notes (Signed)
Subjective:  Patient ID: [name], male    DOB: [birth year]  Age: 78 y.o. MRN: [id number]  CC: No chief complaint on file.   HPI [name] presents for COPD, CAD, LBP f/u  Outpatient Medications Prior to Visit  Medication Sig Dispense Refill   apixaban (ELIQUIS) 5 MG TABS tablet Take 1 tablet (5 mg total) by mouth 2 (two) times daily. 180 tablet 3   aspirin EC 81 MG tablet Take 1 tablet (81 mg total) by mouth daily. 90 tablet 3   atorvastatin (LIPITOR) 40 MG tablet TAKE 1 TABLET AT BEDTIME 90 tablet 4   fluticasone (FLONASE) 50 MCG/ACT nasal spray USE ONE SPRAY IN EACH NOSTRIL DAILY AS NEEDED FOR ALLERGIES OR RHINITIS 48 g 6   folic acid (FOLVITE) 1 MG tablet TAKE 1 TABLET DAILY 90 tablet 3   furosemide (LASIX) 20 MG tablet TAKE 1 TABLET EVERY OTHER DAY 90 tablet 3   halobetasol (ULTRAVATE) 0.05 % ointment Apply topically 2 (two) times daily. 100 g 3   loratadine (CLARITIN) 10 MG tablet Take 10 mg by mouth daily.     LORazepam (ATIVAN) 0.5 MG tablet      metoprolol tartrate (LOPRESSOR) 50 MG tablet TAKE 1 TABLET TWICE A DAY 180 tablet 4   nitroGLYCERIN (NITROSTAT) 0.4 MG SL tablet Place 0.4 mg under the tongue as needed. Chest pain     pantoprazole (PROTONIX) 40 MG tablet TAKE 1 TABLET DAILY 90 tablet 3   potassium chloride (KLOR-CON) 8 MEQ tablet TAKE 1 TABLET DAILY 90 tablet 3   traMADol (ULTRAM) 50 MG tablet Take 1 tablet (50 mg total) by mouth every 6 (six) hours as needed for severe pain. Pain 120 tablet 1   zolpidem (AMBIEN) 5 MG tablet Take 1 tablet (5 mg total) by mouth at bedtime. 90 tablet 0   No facility-administered medications prior to visit.     ROS: Review of Systems  Constitutional: Negative for appetite change, fatigue and unexpected weight change.  HENT: Negative for congestion, nosebleeds, sneezing, sore throat and trouble swallowing.   Eyes: Negative for itching and visual disturbance.  Respiratory: Negative for cough.    Cardiovascular: Negative for chest pain, palpitations and leg swelling.  Gastrointestinal: Negative for abdominal distention, blood in stool, diarrhea and nausea.  Genitourinary: Negative for frequency and hematuria.  Musculoskeletal: Positive for arthralgias, back pain and gait problem. Negative for joint swelling and neck pain.  Skin: Negative for rash.  Neurological: Negative for dizziness, tremors, speech difficulty and weakness.  Psychiatric/Behavioral: Negative for agitation, dysphoric mood, sleep disturbance and suicidal ideas. The patient is not nervous/anxious.     Objective:  BP 126/60 (BP Location: Right Arm, Patient Position: Sitting, Cuff Size: Normal)    Pulse (!) 49    Temp 98 F (36.7 C) (Oral)    Ht 6' (1.829 m)    Wt 174 lb (78.9 kg)    SpO2 97%    BMI 23.60 kg/m   BP Readings from Last 3 Encounters:  10/07/18 126/60  07/08/18 118/70  03/10/18 122/60    Wt Readings from Last 3 Encounters:  10/07/18 174 lb (78.9 kg)  09/05/18 174 lb (78.9 kg)  07/08/18 172 lb 1.3 oz (78.1 kg)    Physical Exam Constitutional:      General: He is not in acute distress.    Appearance: He is well-developed.     Comments: NAD  Eyes:     Conjunctiva/sclera: Conjunctivae normal.     Pupils:  Pupils are equal, round, and reactive to light.  Neck:     Musculoskeletal: Normal range of motion.     Thyroid: No thyromegaly.     Vascular: No JVD.  Cardiovascular:     Rate and Rhythm: Normal rate and regular rhythm.     Heart sounds: Normal heart sounds. No murmur. No friction rub. No gallop.   Pulmonary:     Effort: Pulmonary effort is normal. No respiratory distress.     Breath sounds: Normal breath sounds. No wheezing or rales.  Chest:     Chest wall: No tenderness.  Abdominal:     General: Bowel sounds are normal. There is no distension.     Palpations: Abdomen is soft. There is no mass.     Tenderness: There is no abdominal tenderness. There is no guarding or rebound.   Musculoskeletal: Normal range of motion.        General: Tenderness present.  Lymphadenopathy:     Cervical: No cervical adenopathy.  Skin:    General: Skin is warm and dry.     Findings: No rash.  Neurological:     Mental Status: He is alert and oriented to person, place, and time.     Cranial Nerves: No cranial nerve deficit.     Motor: No abnormal muscle tone.     Coordination: Coordination abnormal.     Gait: Gait abnormal.     Deep Tendon Reflexes: Reflexes are normal and symmetric.  Psychiatric:        Behavior: Behavior normal.        Thought Content: Thought content normal.        Judgment: Judgment normal.    Walker LS tender   Lab Results  Component Value Date   WBC 8.7 11/20/2017   HGB 13.5 11/20/2017   HCT 39.1 11/20/2017   PLT 236.0 11/20/2017   GLUCOSE 112 (H) 07/08/2018   CHOL 130 05/16/2017   TRIG 119.0 05/16/2017   HDL 36.70 (L) 05/16/2017   LDLCALC 70 05/16/2017   ALT 12 05/16/2017   AST 15 05/16/2017   NA 137 07/08/2018   K 4.3 07/08/2018   CL 102 07/08/2018   CREATININE 1.17 07/08/2018   BUN 12 07/08/2018   CO2 28 07/08/2018   TSH 0.51 05/16/2017   PSA 0.01 (L) 02/20/2012   INR 1.30 05/28/2014   HGBA1C 5.7 07/11/2006    Vas [place] Abi With/wo Tbi  Result Date: 04/16/2018 LOWER EXTREMITY DOPPLER STUDY Indications: Peripheral artery disease, and Follow-up s/p intervention. High Risk Factors: Current smoker, prior MI, coronary artery disease. Other Factors: Patient denies claudication.  Vascular Interventions: 08/2009, right CIA and EIA angioplasty, right SFA                         atherectomy and, left SFA stent. Comparison Study: 10/2016 ABIs of 1.1 right and 1.1 left Performing Technologist: Pilar Jarvis RDMS, RVT, RDCS  Examination Guidelines: A complete evaluation includes at minimum, Doppler waveform signals and systolic blood pressure reading at the level of bilateral brachial, anterior tibial, and posterior tibial arteries, when vessel segments  are accessible. Bilateral testing is considered an integral part of a complete examination. Photoelectric Plethysmograph (PPG) waveforms and toe systolic pressure readings are included as required and additional duplex testing as needed. Limited examinations for reoccurring indications may be performed as noted.  ABI Findings: +---------+------------------+-----+---------+--------+  Right     Rt Pressure (mmHg) Index Waveform  Comment   +---------+------------------+-----+---------+--------+  Brachial  136                      triphasic           +---------+------------------+-----+---------+--------+  ATA       134                0.98  triphasic           +---------+------------------+-----+---------+--------+  PTA       139                1.01  triphasic           +---------+------------------+-----+---------+--------+  PERO      133                0.97  triphasic           +---------+------------------+-----+---------+--------+  Great Toe 73                 0.53  Normal              +---------+------------------+-----+---------+--------+ +---------+------------------+-----+---------+-------+  Left      Lt Pressure (mmHg) Index Waveform  Comment  +---------+------------------+-----+---------+-------+  Brachial  137                      triphasic          +---------+------------------+-----+---------+-------+  ATA       136                0.99  triphasic          +---------+------------------+-----+---------+-------+  PTA       126                0.92  biphasic           +---------+------------------+-----+---------+-------+  PERO      132                0.96  triphasic          +---------+------------------+-----+---------+-------+  Great Toe 74                 0.54  Normal             +---------+------------------+-----+---------+-------+ +-------+-----------+-----------+------------+------------+  ABI/TBI Today's ABI Today's TBI Previous ABI Previous TBI   +-------+-----------+-----------+------------+------------+  Right   1.01        0.53        1.1          1.1           +-------+-----------+-----------+------------+------------+  Left    0.99        0.54        1.1          0.81          +-------+-----------+-----------+------------+------------+ Bilateral ABIs appear essentially unchanged compared to prior study on 7/18. Bilateral TBIs appear decreased compared to prior study on 7/18.  Summary:  *See table(s) above for measurements and observations.  Suggest follow up study in 12 months. Electronically signed by Ena Dawley MD on 04/16/2018 at 1:04:54 PM.    Final    Vas [place] Lower Extremity Arterial Duplex  Result Date: 04/16/2018 LOWER EXTREMITY ARTERIAL DUPLEX STUDY Indications: Peripheral artery disease, and Follow-up s/p intervention. Other Factors: Denies claudication.  Vascular Interventions: 08/2009, angioplasty of right CIA and EIA, atherectomy of  right SFA and, stenting of left SFA. Current ABI:            Right 1.01/ left 0.99 Comparison Study: 10/2016, mildly elevated right proximal SFA velocity Performing Technologist: Pilar Jarvis RDMS, RVT, RDCS  Examination Guidelines: A complete evaluation includes B-mode imaging, spectral Doppler, color Doppler, and power Doppler as needed of all accessible portions of each vessel. Bilateral testing is considered an integral part of a complete examination. Limited examinations for reoccurring indications may be performed as noted.  Right Duplex Findings: +----------+--------+-----+--------+--------+--------+             PSV cm/s Ratio Stenosis Waveform Comments  +----------+--------+-----+--------+--------+--------+  CIA Prox   182                                        +----------+--------+-----+--------+--------+--------+  CIA Mid    148                                        +----------+--------+-----+--------+--------+--------+  CIA Distal 219                                         +----------+--------+-----+--------+--------+--------+  EIA Prox   102                                        +----------+--------+-----+--------+--------+--------+  EIA Mid    78                                         +----------+--------+-----+--------+--------+--------+  CFA Prox   98                      biphasic           +----------+--------+-----+--------+--------+--------+  DFA        195                                        +----------+--------+-----+--------+--------+--------+  SFA Prox   160                     biphasic           +----------+--------+-----+--------+--------+--------+  SFA Mid    82                      biphasic           +----------+--------+-----+--------+--------+--------+  SFA Distal 163                     biphasic           +----------+--------+-----+--------+--------+--------+  POP Prox   49                      biphasic           +----------+--------+-----+--------+--------+--------+  POP Distal 31  biphasic           +----------+--------+-----+--------+--------+--------+  Left Duplex Findings: +----------+--------+-----+--------+--------+----------------------+             PSV cm/s Ratio Stenosis Waveform Comments                +----------+--------+-----+--------+--------+----------------------+  CIA Mid    144                                                      +----------+--------+-----+--------+--------+----------------------+  CIA Distal 191                                                      +----------+--------+-----+--------+--------+----------------------+  EIA Prox   155                                                      +----------+--------+-----+--------+--------+----------------------+  EIA Mid    95                                                       +----------+--------+-----+--------+--------+----------------------+  EIA Distal 117                                                       +----------+--------+-----+--------+--------+----------------------+  CFA Prox   153                     biphasic                         +----------+--------+-----+--------+--------+----------------------+  DFA        109                     biphasic                         +----------+--------+-----+--------+--------+----------------------+  SFA Prox   139                     biphasic                         +----------+--------+-----+--------+--------+----------------------+  SFA Mid    132                     biphasic See stent measurements  +----------+--------+-----+--------+--------+----------------------+  SFA Distal 92                      biphasic                         +----------+--------+-----+--------+--------+----------------------+  POP Prox  72                      biphasic                         +----------+--------+-----+--------+--------+----------------------+  POP Distal 35                      biphasic                         +----------+--------+-----+--------+--------+----------------------+  Left Stent(s): +---------------+--------+--------------+--------+--------+  mid SFA         PSV cm/s Stenosis       Waveform Comments  +---------------+--------+--------------+--------+--------+  Prox to Stent   132      1-49% stenosis biphasic           +---------------+--------+--------------+--------+--------+  Proximal Stent  114      1-49% stenosis biphasic           +---------------+--------+--------------+--------+--------+  Mid Stent       109      1-49% stenosis biphasic           +---------------+--------+--------------+--------+--------+  Distal Stent    104      1-49% stenosis biphasic           +---------------+--------+--------------+--------+--------+  Distal to Stent 114      1-49% stenosis biphasic           +---------------+--------+--------------+--------+--------+  Aorta: +--------+-------+----------+----------+--------+--------+-----+           AP (cm) Trans (cm) PSV  (cm/s) Waveform Thrombus Shape  +--------+-------+----------+----------+--------+--------+-----+  Proximal                    121                                 +--------+-------+----------+----------+--------+--------+-----+  Distal                      115                                 +--------+-------+----------+----------+--------+--------+-----+  Summary: Right: Near normal examination. Left: Near normal examination. Patent stent with no evidence of stenosis in the mid SFA artery.  See table(s) above for measurements and observations. Suggest follow up study in 12 months. Electronically signed by Ena Dawley MD on 04/16/2018 at 1:04:12 PM.    Final    Vas US Aorta/ivc/iliacs  Result Date: 04/16/2018 ABDOMINAL AORTA STUDY Indications: S/P iliac artery angioplasty Other Factors: Patient denies claudication. Vascular Interventions: 08/2009, PTA of right CIA and EIA, atherectomy of right                         SFA and, left SFA stent.  Comparison Study: Normal aorta and iliac artery velocities, 10/2016 Performing Technologist: Pilar Jarvis RDMS, RVT, RDCS  Examination Guidelines: A complete evaluation includes B-mode imaging, spectral Doppler, color Doppler, and power Doppler as needed of all accessible portions of each vessel. Bilateral testing is considered an integral part of a complete examination. Limited examinations for reoccurring indications may be performed as noted.  Abdominal Aorta Findings: +-------------+-------+----------+----------+--------+--------+--------+  Location      AP (cm) Trans (cm) PSV (cm/s) Waveform Thrombus Comments  +-------------+-------+----------+----------+--------+--------+--------+  Proximal  121                                    +-------------+-------+----------+----------+--------+--------+--------+  Distal                           115                                    +-------------+-------+----------+----------+--------+--------+--------+  RT  CIA Prox                      182                                    +-------------+-------+----------+----------+--------+--------+--------+  RT CIA Mid                       148        biphasic                    +-------------+-------+----------+----------+--------+--------+--------+  RT CIA Distal                    219        biphasic                    +-------------+-------+----------+----------+--------+--------+--------+  RT EIA Prox                      102        biphasic                    +-------------+-------+----------+----------+--------+--------+--------+  RT EIA Mid                       78         biphasic                    +-------------+-------+----------+----------+--------+--------+--------+  RT EIA Distal                    154        biphasic                    +-------------+-------+----------+----------+--------+--------+--------+  LT CIA Prox                      235        biphasic                    +-------------+-------+----------+----------+--------+--------+--------+  LT CIA Mid                       144        biphasic                    +-------------+-------+----------+----------+--------+--------+--------+  LT CIA Distal                    191        biphasic                    +-------------+-------+----------+----------+--------+--------+--------+  LT EIA Prox  155        biphasic                    +-------------+-------+----------+----------+--------+--------+--------+  LT EIA Mid                       95         biphasic                    +-------------+-------+----------+----------+--------+--------+--------+  LT EIA Distal                    117        biphasic                    +-------------+-------+----------+----------+--------+--------+--------+ IVC/Iliac Findings: +-------+------+--------+--------+    IVC   Patent Thrombus Comments  +-------+------+--------+--------+  IVC Mid patent                    +-------+------+--------+--------+   Summary: Abdominal Aorta: No evidence of an abdominal aortic aneurysm was visualized. The largest aortic measurement is 1.7 cm. Stenosis: +------------------+-------------+  Location           Stenosis       +------------------+-------------+  Right Common Iliac >50% stenosis  +------------------+-------------+  Left Common Iliac  >50% stenosis  +------------------+-------------+  *See table(s) above for measurements and observations. Suggest follow up study in 12 months.  Electronically signed by Ena Dawley MD on 04/16/2018 at 1:04:26 PM.    Final     Assessment & Plan:   There are no diagnoses linked to this encounter.   No orders of the defined types were placed in this encounter.    Follow-up: No follow-ups on file.  Walker Kehr, MD

## 2018-10-07 NOTE — Assessment & Plan Note (Signed)
Still smoking

## 2018-10-07 NOTE — Assessment & Plan Note (Signed)
Tramadol prn 

## 2018-10-07 NOTE — Assessment & Plan Note (Signed)
Labs

## 2018-10-07 NOTE — Assessment & Plan Note (Signed)
Plavix, Lipitor, Eliquis

## 2018-11-04 ENCOUNTER — Telehealth: Payer: Self-pay | Admitting: Internal Medicine

## 2018-11-04 NOTE — Telephone Encounter (Signed)
Patient will call office to schedule AWV.

## 2018-11-09 ENCOUNTER — Encounter

## 2019-01-07 ENCOUNTER — Ambulatory Visit (INDEPENDENT_AMBULATORY_CARE_PROVIDER_SITE_OTHER): Payer: Medicare Other | Admitting: Internal Medicine

## 2019-01-07 ENCOUNTER — Other Ambulatory Visit: Payer: Self-pay

## 2019-01-07 ENCOUNTER — Encounter: Payer: Self-pay | Admitting: Internal Medicine

## 2019-01-07 VITALS — BP 120/72 | HR 50 | Temp 98.2°F | Ht 72.0 in | Wt 172.0 lb

## 2019-01-07 DIAGNOSIS — I6523 Occlusion and stenosis of bilateral carotid arteries: Secondary | ICD-10-CM

## 2019-01-07 DIAGNOSIS — M7022 Olecranon bursitis, left elbow: Secondary | ICD-10-CM

## 2019-01-07 DIAGNOSIS — J410 Simple chronic bronchitis: Secondary | ICD-10-CM | POA: Diagnosis not present

## 2019-01-07 DIAGNOSIS — E782 Mixed hyperlipidemia: Secondary | ICD-10-CM | POA: Diagnosis not present

## 2019-01-07 DIAGNOSIS — Z23 Encounter for immunization: Secondary | ICD-10-CM

## 2019-01-07 DIAGNOSIS — F5101 Primary insomnia: Secondary | ICD-10-CM | POA: Diagnosis not present

## 2019-01-07 DIAGNOSIS — M7032 Other bursitis of elbow, left elbow: Secondary | ICD-10-CM | POA: Insufficient documentation

## 2019-01-07 NOTE — Assessment & Plan Note (Signed)
Doing fair 

## 2019-01-07 NOTE — Addendum Note (Signed)
Addended by: Karren Cobble on: 01/07/2019 11:40 AM   Modules accepted: Orders

## 2019-01-07 NOTE — Progress Notes (Signed)
Subjective:  Patient ID: ZUBAIR BALZ, male    DOB: November 06, 1940  Age: 78 y.o. MRN: [id number]  CC: No chief complaint on file.   HPI Gianno Rondan Jennings presents for chronic pain, dyslipidemia, COPD, insomnia C/o L elbow swelling  Outpatient Medications Prior to Visit  Medication Sig Dispense Refill   apixaban (ELIQUIS) 5 MG TABS tablet Take 1 tablet (5 mg total) by mouth 2 (two) times daily. 180 tablet 3   aspirin EC 81 MG tablet Take 1 tablet (81 mg total) by mouth daily. 90 tablet 3   atorvastatin (LIPITOR) 40 MG tablet TAKE 1 TABLET AT BEDTIME 90 tablet 4   fluticasone (FLONASE) 50 MCG/ACT nasal spray USE ONE SPRAY IN EACH NOSTRIL DAILY AS NEEDED FOR ALLERGIES OR RHINITIS 48 g 6   folic acid (FOLVITE) 1 MG tablet TAKE 1 TABLET DAILY 90 tablet 3   furosemide (LASIX) 20 MG tablet TAKE 1 TABLET EVERY OTHER DAY 90 tablet 3   halobetasol (ULTRAVATE) 0.05 % ointment Apply topically 2 (two) times daily. 100 g 3   loratadine (CLARITIN) 10 MG tablet Take 10 mg by mouth daily.     LORazepam (ATIVAN) 0.5 MG tablet      metoprolol tartrate (LOPRESSOR) 50 MG tablet TAKE 1 TABLET TWICE A DAY 180 tablet 4   nitroGLYCERIN (NITROSTAT) 0.4 MG SL tablet Place 0.4 mg under the tongue as needed. Chest pain     pantoprazole (PROTONIX) 40 MG tablet TAKE 1 TABLET DAILY 90 tablet 3   potassium chloride (KLOR-CON) 8 MEQ tablet TAKE 1 TABLET DAILY 90 tablet 3   traMADol (ULTRAM) 50 MG tablet Take 1 tablet (50 mg total) by mouth every 6 (six) hours as needed for severe pain. Pain 120 tablet 1   zolpidem (AMBIEN) 5 MG tablet Take 1 tablet (5 mg total) by mouth at bedtime. 90 tablet 0   No facility-administered medications prior to visit.     ROS: Review of Systems  Constitutional: Positive for fatigue. Negative for appetite change and unexpected weight change.  HENT: Negative for congestion, nosebleeds, sneezing, sore throat and trouble swallowing.   Eyes: Negative for itching and  visual disturbance.  Respiratory: Negative for cough and chest tightness.   Cardiovascular: Negative for chest pain, palpitations and leg swelling.  Gastrointestinal: Negative for abdominal distention, blood in stool, diarrhea and nausea.  Genitourinary: Negative for frequency and hematuria.  Musculoskeletal: Positive for arthralgias, back pain and gait problem. Negative for joint swelling and neck pain.  Skin: Negative for rash.  Neurological: Negative for dizziness, tremors, speech difficulty and weakness.  Psychiatric/Behavioral: Negative for agitation, dysphoric mood, sleep disturbance and suicidal ideas. The patient is not nervous/anxious.     Objective:  BP 120/72 (BP Location: Left Arm, Patient Position: Sitting, Cuff Size: Normal)    Pulse (!) 50    Temp 98.2 F (36.8 C) (Oral)    Ht 6' (1.829 m)    Wt 172 lb (78 kg)    SpO2 99%    BMI 23.33 kg/m   BP Readings from Last 3 Encounters:  01/07/19 120/72  10/07/18 126/60  07/08/18 118/70    Wt Readings from Last 3 Encounters:  01/07/19 172 lb (78 kg)  10/07/18 174 lb (78.9 kg)  09/05/18 174 lb (78.9 kg)    Physical Exam Constitutional:      General: He is not in acute distress.    Appearance: He is well-developed.     Comments: NAD  Eyes:  Conjunctiva/sclera: Conjunctivae normal.     Pupils: Pupils are equal, round, and reactive to light.  Neck:     Musculoskeletal: Normal range of motion.     Thyroid: No thyromegaly.     Vascular: No JVD.  Cardiovascular:     Rate and Rhythm: Normal rate and regular rhythm.     Heart sounds: Normal heart sounds. No murmur. No friction rub. No gallop.   Pulmonary:     Effort: Pulmonary effort is normal. No respiratory distress.     Breath sounds: Normal breath sounds. No wheezing or rales.  Chest:     Chest wall: No tenderness.  Abdominal:     General: Bowel sounds are normal. There is no distension.     Palpations: Abdomen is soft. There is no mass.     Tenderness: There is  no abdominal tenderness. There is no guarding or rebound.  Musculoskeletal: Normal range of motion.        General: Tenderness present.  Lymphadenopathy:     Cervical: No cervical adenopathy.  Skin:    General: Skin is warm and dry.     Findings: No rash.  Neurological:     Mental Status: He is alert and oriented to person, place, and time.     Cranial Nerves: No cranial nerve deficit.     Motor: No abnormal muscle tone.     Coordination: Coordination normal.     Gait: Gait normal.     Deep Tendon Reflexes: Reflexes are normal and symmetric.  Psychiatric:        Behavior: Behavior normal.        Thought Content: Thought content normal.        Judgment: Judgment normal.   L elbow bursitis - swelling LS painful  Lab Results  Component Value Date   WBC 8.7 11/20/2017   HGB 13.5 11/20/2017   HCT 39.1 11/20/2017   PLT 236.0 11/20/2017   GLUCOSE 112 (H) 07/08/2018   CHOL 130 05/16/2017   TRIG 119.0 05/16/2017   HDL 36.70 (L) 05/16/2017   LDLCALC 70 05/16/2017   ALT 12 05/16/2017   AST 15 05/16/2017   NA 137 07/08/2018   K 4.3 07/08/2018   CL 102 07/08/2018   CREATININE 1.17 07/08/2018   BUN 12 07/08/2018   CO2 28 07/08/2018   TSH 0.51 05/16/2017   PSA 0.01 (L) 02/20/2012   INR 1.30 05/28/2014   HGBA1C 5.7 07/11/2006    Vas [place] Abi With/wo Tbi  Result Date: 04/16/2018 LOWER EXTREMITY DOPPLER STUDY Indications: Peripheral artery disease, and Follow-up s/p intervention. High Risk Factors: Current smoker, prior MI, coronary artery disease. Other Factors: Patient denies claudication.  Vascular Interventions: 08/2009, right CIA and EIA angioplasty, right SFA                         atherectomy and, left SFA stent. Comparison Study: 10/2016 ABIs of 1.1 right and 1.1 left Performing Technologist: Pilar Jarvis RDMS, RVT, RDCS  Examination Guidelines: A complete evaluation includes at minimum, Doppler waveform signals and systolic blood pressure reading at the level of bilateral  brachial, anterior tibial, and posterior tibial arteries, when vessel segments are accessible. Bilateral testing is considered an integral part of a complete examination. Photoelectric Plethysmograph (PPG) waveforms and toe systolic pressure readings are included as required and additional duplex testing as needed. Limited examinations for reoccurring indications may be performed as noted.  ABI Findings: +---------+------------------+-----+---------+--------+  Right     Rt  Pressure (mmHg) Index Waveform  Comment   +---------+------------------+-----+---------+--------+  Brachial  136                      triphasic           +---------+------------------+-----+---------+--------+  ATA       134                0.98  triphasic           +---------+------------------+-----+---------+--------+  PTA       139                1.01  triphasic           +---------+------------------+-----+---------+--------+  PERO      133                0.97  triphasic           +---------+------------------+-----+---------+--------+  Great Toe 73                 0.53  Normal              +---------+------------------+-----+---------+--------+ +---------+------------------+-----+---------+-------+  Left      Lt Pressure (mmHg) Index Waveform  Comment  +---------+------------------+-----+---------+-------+  Brachial  137                      triphasic          +---------+------------------+-----+---------+-------+  ATA       136                0.99  triphasic          +---------+------------------+-----+---------+-------+  PTA       126                0.92  biphasic           +---------+------------------+-----+---------+-------+  PERO      132                0.96  triphasic          +---------+------------------+-----+---------+-------+  Great Toe 74                 0.54  Normal             +---------+------------------+-----+---------+-------+ +-------+-----------+-----------+------------+------------+  ABI/TBI Today's ABI Today's  TBI Previous ABI Previous TBI  +-------+-----------+-----------+------------+------------+  Right   1.01        0.53        1.1          1.1           +-------+-----------+-----------+------------+------------+  Left    0.99        0.54        1.1          0.81          +-------+-----------+-----------+------------+------------+ Bilateral ABIs appear essentially unchanged compared to prior study on 7/18. Bilateral TBIs appear decreased compared to prior study on 7/18.  Summary:  *See table(s) above for measurements and observations.  Suggest follow up study in 12 months. Electronically signed by Ena Dawley MD on 04/16/2018 at 1:04:54 PM.    Final    Vas [place] Lower Extremity Arterial Duplex  Result Date: 04/16/2018 LOWER EXTREMITY ARTERIAL DUPLEX STUDY Indications: Peripheral artery disease, and Follow-up s/p intervention. Other Factors: Denies claudication.  Vascular Interventions: 08/2009, angioplasty of right CIA and EIA, atherectomy of  right SFA and, stenting of left SFA. Current ABI:            Right 1.01/ left 0.99 Comparison Study: 10/2016, mildly elevated right proximal SFA velocity Performing Technologist: Pilar Jarvis RDMS, RVT, RDCS  Examination Guidelines: A complete evaluation includes B-mode imaging, spectral Doppler, color Doppler, and power Doppler as needed of all accessible portions of each vessel. Bilateral testing is considered an integral part of a complete examination. Limited examinations for reoccurring indications may be performed as noted.  Right Duplex Findings: +----------+--------+-----+--------+--------+--------+             PSV cm/s Ratio Stenosis Waveform Comments  +----------+--------+-----+--------+--------+--------+  CIA Prox   182                                        +----------+--------+-----+--------+--------+--------+  CIA Mid    148                                        +----------+--------+-----+--------+--------+--------+  CIA Distal 219                                         +----------+--------+-----+--------+--------+--------+  EIA Prox   102                                        +----------+--------+-----+--------+--------+--------+  EIA Mid    78                                         +----------+--------+-----+--------+--------+--------+  CFA Prox   98                      biphasic           +----------+--------+-----+--------+--------+--------+  DFA        195                                        +----------+--------+-----+--------+--------+--------+  SFA Prox   160                     biphasic           +----------+--------+-----+--------+--------+--------+  SFA Mid    82                      biphasic           +----------+--------+-----+--------+--------+--------+  SFA Distal 163                     biphasic           +----------+--------+-----+--------+--------+--------+  POP Prox   49                      biphasic           +----------+--------+-----+--------+--------+--------+  POP Distal 31  biphasic           +----------+--------+-----+--------+--------+--------+  Left Duplex Findings: +----------+--------+-----+--------+--------+----------------------+             PSV cm/s Ratio Stenosis Waveform Comments                +----------+--------+-----+--------+--------+----------------------+  CIA Mid    144                                                      +----------+--------+-----+--------+--------+----------------------+  CIA Distal 191                                                      +----------+--------+-----+--------+--------+----------------------+  EIA Prox   155                                                      +----------+--------+-----+--------+--------+----------------------+  EIA Mid    95                                                       +----------+--------+-----+--------+--------+----------------------+  EIA Distal 117                                                       +----------+--------+-----+--------+--------+----------------------+  CFA Prox   153                     biphasic                         +----------+--------+-----+--------+--------+----------------------+  DFA        109                     biphasic                         +----------+--------+-----+--------+--------+----------------------+  SFA Prox   139                     biphasic                         +----------+--------+-----+--------+--------+----------------------+  SFA Mid    132                     biphasic See stent measurements  +----------+--------+-----+--------+--------+----------------------+  SFA Distal 92                      biphasic                         +----------+--------+-----+--------+--------+----------------------+  POP Prox  72                      biphasic                         +----------+--------+-----+--------+--------+----------------------+  POP Distal 35                      biphasic                         +----------+--------+-----+--------+--------+----------------------+  Left Stent(s): +---------------+--------+--------------+--------+--------+  mid SFA         PSV cm/s Stenosis       Waveform Comments  +---------------+--------+--------------+--------+--------+  Prox to Stent   132      1-49% stenosis biphasic           +---------------+--------+--------------+--------+--------+  Proximal Stent  114      1-49% stenosis biphasic           +---------------+--------+--------------+--------+--------+  Mid Stent       109      1-49% stenosis biphasic           +---------------+--------+--------------+--------+--------+  Distal Stent    104      1-49% stenosis biphasic           +---------------+--------+--------------+--------+--------+  Distal to Stent 114      1-49% stenosis biphasic           +---------------+--------+--------------+--------+--------+  Aorta: +--------+-------+----------+----------+--------+--------+-----+           AP (cm) Trans (cm) PSV  (cm/s) Waveform Thrombus Shape  +--------+-------+----------+----------+--------+--------+-----+  Proximal                    121                                 +--------+-------+----------+----------+--------+--------+-----+  Distal                      115                                 +--------+-------+----------+----------+--------+--------+-----+  Summary: Right: Near normal examination. Left: Near normal examination. Patent stent with no evidence of stenosis in the mid SFA artery.  See table(s) above for measurements and observations. Suggest follow up study in 12 months. Electronically signed by Ena Dawley MD on 04/16/2018 at 1:04:12 PM.    Final    Vas [place] Aorta/ivc/iliacs  Result Date: 04/16/2018 ABDOMINAL AORTA STUDY Indications: S/P iliac artery angioplasty Other Factors: Patient denies claudication. Vascular Interventions: 08/2009, PTA of right CIA and EIA, atherectomy of right                         SFA and, left SFA stent.  Comparison Study: Normal aorta and iliac artery velocities, 10/2016 Performing Technologist: Pilar Jarvis RDMS, RVT, RDCS  Examination Guidelines: A complete evaluation includes B-mode imaging, spectral Doppler, color Doppler, and power Doppler as needed of all accessible portions of each vessel. Bilateral testing is considered an integral part of a complete examination. Limited examinations for reoccurring indications may be performed as noted.  Abdominal Aorta Findings: +-------------+-------+----------+----------+--------+--------+--------+  Location      AP (cm) Trans (cm) PSV (cm/s) Waveform Thrombus Comments  +-------------+-------+----------+----------+--------+--------+--------+  Proximal  121                                    +-------------+-------+----------+----------+--------+--------+--------+  Distal                           115                                    +-------------+-------+----------+----------+--------+--------+--------+  RT  CIA Prox                      182                                    +-------------+-------+----------+----------+--------+--------+--------+  RT CIA Mid                       148        biphasic                    +-------------+-------+----------+----------+--------+--------+--------+  RT CIA Distal                    219        biphasic                    +-------------+-------+----------+----------+--------+--------+--------+  RT EIA Prox                      102        biphasic                    +-------------+-------+----------+----------+--------+--------+--------+  RT EIA Mid                       78         biphasic                    +-------------+-------+----------+----------+--------+--------+--------+  RT EIA Distal                    154        biphasic                    +-------------+-------+----------+----------+--------+--------+--------+  LT CIA Prox                      235        biphasic                    +-------------+-------+----------+----------+--------+--------+--------+  LT CIA Mid                       144        biphasic                    +-------------+-------+----------+----------+--------+--------+--------+  LT CIA Distal                    191        biphasic                    +-------------+-------+----------+----------+--------+--------+--------+  LT EIA Prox  155        biphasic                    +-------------+-------+----------+----------+--------+--------+--------+  LT EIA Mid                       95         biphasic                    +-------------+-------+----------+----------+--------+--------+--------+  LT EIA Distal                    117        biphasic                    +-------------+-------+----------+----------+--------+--------+--------+ IVC/Iliac Findings: +-------+------+--------+--------+    IVC   Patent Thrombus Comments  +-------+------+--------+--------+  IVC Mid patent                    +-------+------+--------+--------+   Summary: Abdominal Aorta: No evidence of an abdominal aortic aneurysm was visualized. The largest aortic measurement is 1.7 cm. Stenosis: +------------------+-------------+  Location           Stenosis       +------------------+-------------+  Right Common Iliac >50% stenosis  +------------------+-------------+  Left Common Iliac  >50% stenosis  +------------------+-------------+  *See table(s) above for measurements and observations. Suggest follow up study in 12 months.  Electronically signed by Ena Dawley MD on 04/16/2018 at 1:04:26 PM.    Final     Assessment & Plan:   There are no diagnoses linked to this encounter.   No orders of the defined types were placed in this encounter.    Follow-up: No follow-ups on file.  Walker Kehr, MD

## 2019-01-07 NOTE — Assessment & Plan Note (Signed)
Lipitor 

## 2019-01-07 NOTE — Assessment & Plan Note (Signed)
Zolpidem prn  Potential benefits of a long term benzodiazepines  use as well as potential risks  and complications were explained to the patient and were aknowledged. 

## 2019-01-07 NOTE — Patient Instructions (Signed)
Elbow Bursitis Bursitis is swelling and pain at the tip of your elbow. This happens when fluid builds up in a sac under your skin (bursa). This may also be called olecranon bursitis. Follow these instructions at home: Medicines  Take over-the-counter and prescription medicines only as told by your doctor.  If you were prescribed an antibiotic, take it exactly as told by your doctor. Do not stop taking it even if you start to feel better. Managing pain, stiffness, and swelling   If told, put ice on your elbow: ? Put ice in a plastic bag. ? Place a towel between your skin and the bag. ? Leave the ice on for 20 minutes, 2-3 times a day.  If your bursitis is caused by an injury, follow instructions from your doctor about: ? Resting your elbow. ? Wearing a bandage.  Wear elbow pads or elbow wraps as needed. These help cushion your elbow. General instructions  Avoid any activities that cause elbow pain. Ask your doctor what activities are safe for you.  Keep all follow-up visits as told by your doctor. This is important. Contact a doctor if you have:  A fever.  Problems that do not get better with treatment.  Pain or swelling that: ? Gets worse. ? Goes away and then comes back.  Pus draining from your elbow. Get help right away if you have:  Trouble moving your arm, hand, or fingers. Summary  Bursitis is swelling and pain at the tip of the elbow.  You may need to take medicine or put ice on your elbow.  Contact your doctor if your problems do not get better with treatment. This information is not intended to replace advice given to you by your health care provider. Make sure you discuss any questions you have with your health care provider. Document Released: 09/13/2009 Document Revised: 03/08/2017 Document Reviewed: 03/05/2017 [employer]vier Patient Education  2020 [employer]  

## 2019-01-07 NOTE — Assessment & Plan Note (Signed)
L elbow bursitis Options to treat discussed incl aspiration

## 2019-04-09 ENCOUNTER — Other Ambulatory Visit: Payer: Self-pay

## 2019-04-09 ENCOUNTER — Encounter: Payer: Self-pay | Admitting: Internal Medicine

## 2019-04-09 ENCOUNTER — Ambulatory Visit (INDEPENDENT_AMBULATORY_CARE_PROVIDER_SITE_OTHER): Payer: Medicare Other | Admitting: Internal Medicine

## 2019-04-09 VITALS — BP 124/60 | HR 52 | Temp 98.5°F | Ht 72.0 in | Wt 171.0 lb

## 2019-04-09 DIAGNOSIS — M5440 Lumbago with sciatica, unspecified side: Secondary | ICD-10-CM

## 2019-04-09 DIAGNOSIS — E876 Hypokalemia: Secondary | ICD-10-CM | POA: Diagnosis not present

## 2019-04-09 DIAGNOSIS — F5101 Primary insomnia: Secondary | ICD-10-CM | POA: Diagnosis not present

## 2019-04-09 DIAGNOSIS — I6523 Occlusion and stenosis of bilateral carotid arteries: Secondary | ICD-10-CM | POA: Diagnosis not present

## 2019-04-09 DIAGNOSIS — I4892 Unspecified atrial flutter: Secondary | ICD-10-CM

## 2019-04-09 DIAGNOSIS — G8929 Other chronic pain: Secondary | ICD-10-CM

## 2019-04-09 LAB — BASIC METABOLIC PANEL
BUN: 15 mg/dL (ref 6–23)
CO2: 31 mEq/L (ref 19–32)
Calcium: 9.7 mg/dL (ref 8.4–10.5)
Chloride: 103 mEq/L (ref 96–112)
Creatinine, Ser: 1.1 mg/dL (ref 0.40–1.50)
GFR: 64.37 mL/min (ref >60.00)
Glucose, Bld: 99 mg/dL (ref 70–99)
Potassium: 4.1 mEq/L (ref 3.5–5.1)
Sodium: 140 mEq/L (ref 135–145)

## 2019-04-09 MED ORDER — DICLOFENAC SODIUM 1 % EX GEL: 1.0000 "application " | Freq: Four times a day (QID) | CUTANEOUS | 3 refills | Status: DC

## 2019-04-09 MED ORDER — ZOLPIDEM TARTRATE 5 MG PO TABS: 5.0000 mg | ORAL_TABLET | Freq: Every day | ORAL | 1 refills | Status: DC

## 2019-04-09 NOTE — Assessment & Plan Note (Signed)
BMET 

## 2019-04-09 NOTE — Assessment & Plan Note (Signed)
Zolpidem prn  Potential benefits of a long term benzodiazepines  use as well as potential risks  and complications were explained to the patient and were aknowledged. 

## 2019-04-09 NOTE — Progress Notes (Signed)
Subjective:  Patient ID: [name], male    DOB: 18-Mar-1941  Age: 78 y.o. MRN: [id number]  CC: No chief complaint on file.   HPI [name] presents for HTN, CAD, LBP f/u F/u insomnia  Outpatient Medications Prior to Visit  Medication Sig Dispense Refill  . apixaban (ELIQUIS) 5 MG TABS tablet Take 1 tablet (5 mg total) by mouth 2 (two) times daily. 180 tablet 3  . aspirin EC 81 MG tablet Take 1 tablet (81 mg total) by mouth daily. 90 tablet 3  . atorvastatin (LIPITOR) 40 MG tablet TAKE 1 TABLET AT BEDTIME 90 tablet 4  . fluticasone (FLONASE) 50 MCG/ACT nasal spray USE ONE SPRAY IN EACH NOSTRIL DAILY AS NEEDED FOR ALLERGIES OR RHINITIS 48 g 6  . folic acid (FOLVITE) 1 MG tablet TAKE 1 TABLET DAILY 90 tablet 3  . furosemide (LASIX) 20 MG tablet TAKE 1 TABLET EVERY OTHER DAY 90 tablet 3  . halobetasol (ULTRAVATE) 0.05 % ointment Apply topically 2 (two) times daily. 100 g 3  . loratadine (CLARITIN) 10 MG tablet Take 10 mg by mouth daily.    Marland Kitchen LORazepam (ATIVAN) 0.5 MG tablet     . metoprolol tartrate (LOPRESSOR) 50 MG tablet TAKE 1 TABLET TWICE A DAY 180 tablet 4  . nitroGLYCERIN (NITROSTAT) 0.4 MG SL tablet Place 0.4 mg under the tongue as needed. Chest pain    . pantoprazole (PROTONIX) 40 MG tablet TAKE 1 TABLET DAILY 90 tablet 3  . potassium chloride (KLOR-CON) 8 MEQ tablet TAKE 1 TABLET DAILY 90 tablet 3  . traMADol (ULTRAM) 50 MG tablet Take 1 tablet (50 mg total) by mouth every 6 (six) hours as needed for severe pain. Pain 120 tablet 1  . zolpidem (AMBIEN) 5 MG tablet Take 1 tablet (5 mg total) by mouth at bedtime. 90 tablet 0   No facility-administered medications prior to visit.    ROS: Review of Systems  Constitutional: Positive for fatigue. Negative for appetite change and unexpected weight change.  HENT: Negative for congestion, nosebleeds, sneezing, sore throat and trouble swallowing.   Eyes: Negative for itching and visual disturbance.  Respiratory:  Negative for cough.   Cardiovascular: Negative for chest pain, palpitations and leg swelling.  Gastrointestinal: Negative for abdominal distention, blood in stool, diarrhea and nausea.  Genitourinary: Negative for frequency and hematuria.  Musculoskeletal: Positive for arthralgias, back pain and gait problem. Negative for joint swelling and neck pain.  Skin: Negative for rash.  Neurological: Negative for dizziness, tremors, speech difficulty and weakness.  Psychiatric/Behavioral: Negative for agitation, dysphoric mood, sleep disturbance and suicidal ideas. The patient is not nervous/anxious.     Objective:  BP 124/60 (BP Location: Left Arm, Patient Position: Sitting, Cuff Size: Normal)   Pulse (!) 52   Temp 98.5 F (36.9 C) (Oral)   Ht 6' (1.829 m)   Wt 171 lb (77.6 kg)   SpO2 97%   BMI 23.19 kg/m   BP Readings from Last 3 Encounters:  04/09/19 124/60  01/07/19 120/72  10/07/18 126/60    Wt Readings from Last 3 Encounters:  04/09/19 171 lb (77.6 kg)  01/07/19 172 lb (78 kg)  10/07/18 174 lb (78.9 kg)    Physical Exam Constitutional:      General: He is not in acute distress.    Appearance: He is well-developed.     Comments: NAD  Eyes:     Conjunctiva/sclera: Conjunctivae normal.     Pupils: Pupils are equal, round, and  reactive to light.  Neck:     Thyroid: No thyromegaly.     Vascular: No JVD.  Cardiovascular:     Rate and Rhythm: Normal rate and regular rhythm.     Heart sounds: Normal heart sounds. No murmur. No friction rub. No gallop.   Pulmonary:     Effort: Pulmonary effort is normal. No respiratory distress.     Breath sounds: Normal breath sounds. No wheezing or rales.  Chest:     Chest wall: No tenderness.  Abdominal:     General: Bowel sounds are normal. There is no distension.     Palpations: Abdomen is soft. There is no mass.     Tenderness: There is no abdominal tenderness. There is no guarding or rebound.  Musculoskeletal:        General:  Tenderness present. Normal range of motion.     Cervical back: Normal range of motion.  Lymphadenopathy:     Cervical: No cervical adenopathy.  Skin:    General: Skin is warm and dry.     Findings: No rash.  Neurological:     Mental Status: He is alert and oriented to person, place, and time.     Cranial Nerves: No cranial nerve deficit.     Motor: No abnormal muscle tone.     Coordination: Coordination abnormal.     Gait: Gait abnormal.     Deep Tendon Reflexes: Reflexes are normal and symmetric.  Psychiatric:        Behavior: Behavior normal.        Thought Content: Thought content normal.        Judgment: Judgment normal.   stiff cane  Lab Results  Component Value Date   WBC 8.7 11/20/2017   HGB 13.5 11/20/2017   HCT 39.1 11/20/2017   PLT 236.0 11/20/2017   GLUCOSE 112 (H) 07/08/2018   CHOL 130 05/16/2017   TRIG 119.0 05/16/2017   HDL 36.70 (L) 05/16/2017   LDLCALC 70 05/16/2017   ALT 12 05/16/2017   AST 15 05/16/2017   NA 137 07/08/2018   K 4.3 07/08/2018   CL 102 07/08/2018   CREATININE 1.17 07/08/2018   BUN 12 07/08/2018   CO2 28 07/08/2018   TSH 0.51 05/16/2017   PSA 0.01 (L) 02/20/2012   INR 1.30 05/28/2014   HGBA1C 5.7 07/11/2006    VAS [place] ABI WITH/WO TBI  Result Date: 04/16/2018 LOWER EXTREMITY DOPPLER STUDY Indications: Peripheral artery disease, and Follow-up s/p intervention. High Risk Factors: Current smoker, prior MI, coronary artery disease. Other Factors: Patient denies claudication.  Vascular Interventions: 08/2009, right CIA and EIA angioplasty, right SFA                         atherectomy and, left SFA stent. Comparison Study: 10/2016 ABIs of 1.1 right and 1.1 left Performing Technologist: Pilar Jarvis RDMS, RVT, RDCS  Examination Guidelines: A complete evaluation includes at minimum, Doppler waveform signals and systolic blood pressure reading at the level of bilateral brachial, anterior tibial, and posterior tibial arteries, when vessel segments are  accessible. Bilateral testing is considered an integral part of a complete examination. Photoelectric Plethysmograph (PPG) waveforms and toe systolic pressure readings are included as required and additional duplex testing as needed. Limited examinations for reoccurring indications may be performed as noted.  ABI Findings: +---------+------------------+-----+---------+--------+ Right    Rt Pressure (mmHg)IndexWaveform Comment  +---------+------------------+-----+---------+--------+ Brachial 136  triphasic         +---------+------------------+-----+---------+--------+ ATA      134               0.98 triphasic         +---------+------------------+-----+---------+--------+ PTA      139               1.01 triphasic         +---------+------------------+-----+---------+--------+ PERO     133               0.97 triphasic         +---------+------------------+-----+---------+--------+ Great Toe73                0.53 Normal            +---------+------------------+-----+---------+--------+ +---------+------------------+-----+---------+-------+ Left     Lt Pressure (mmHg)IndexWaveform Comment +---------+------------------+-----+---------+-------+ Brachial 137                    triphasic        +---------+------------------+-----+---------+-------+ ATA      136               0.99 triphasic        +---------+------------------+-----+---------+-------+ PTA      126               0.92 biphasic         +---------+------------------+-----+---------+-------+ PERO     132               0.96 triphasic        +---------+------------------+-----+---------+-------+ Great Toe74                0.54 Normal           +---------+------------------+-----+---------+-------+ +-------+-----------+-----------+------------+------------+ ABI/TBIToday's ABIToday's TBIPrevious ABIPrevious TBI +-------+-----------+-----------+------------+------------+  Right  1.01       0.53       1.1         1.1          +-------+-----------+-----------+------------+------------+ Left   0.99       0.54       1.1         0.81         +-------+-----------+-----------+------------+------------+ Bilateral ABIs appear essentially unchanged compared to prior study on 7/18. Bilateral TBIs appear decreased compared to prior study on 7/18.  Summary:  *See table(s) above for measurements and observations.  Suggest follow up study in 12 months. Electronically signed by Ena Dawley MD on 04/16/2018 at 1:04:54 PM.    Final    VAS [place] LOWER EXTREMITY ARTERIAL DUPLEX  Result Date: 04/16/2018 LOWER EXTREMITY ARTERIAL DUPLEX STUDY Indications: Peripheral artery disease, and Follow-up s/p intervention. Other Factors: Denies claudication.  Vascular Interventions: 08/2009, angioplasty of right CIA and EIA, atherectomy of                         right SFA and, stenting of left SFA. Current ABI:            Right 1.01/ left 0.99 Comparison Study: 10/2016, mildly elevated right proximal SFA velocity Performing Technologist: Pilar Jarvis RDMS, RVT, RDCS  Examination Guidelines: A complete evaluation includes B-mode imaging, spectral Doppler, color Doppler, and power Doppler as needed of all accessible portions of each vessel. Bilateral testing is considered an integral part of a complete examination. Limited examinations for reoccurring indications may be performed as noted.  Right Duplex Findings: +----------+--------+-----+--------+--------+--------+  PSV cm/sRatioStenosisWaveformComments +----------+--------+-----+--------+--------+--------+ CIA Prox  182                                   +----------+--------+-----+--------+--------+--------+ CIA Mid   148                                   +----------+--------+-----+--------+--------+--------+ CIA Distal219                                   +----------+--------+-----+--------+--------+--------+ EIA  Prox  102                                   +----------+--------+-----+--------+--------+--------+ EIA Mid   78                                    +----------+--------+-----+--------+--------+--------+ CFA Prox  98                   biphasic         +----------+--------+-----+--------+--------+--------+ DFA       195                                   +----------+--------+-----+--------+--------+--------+ SFA Prox  160                  biphasic         +----------+--------+-----+--------+--------+--------+ SFA Mid   82                   biphasic         +----------+--------+-----+--------+--------+--------+ SFA Distal163                  biphasic         +----------+--------+-----+--------+--------+--------+ POP Prox  49                   biphasic         +----------+--------+-----+--------+--------+--------+ POP Distal31                   biphasic         +----------+--------+-----+--------+--------+--------+  Left Duplex Findings: +----------+--------+-----+--------+--------+----------------------+           PSV cm/sRatioStenosisWaveformComments               +----------+--------+-----+--------+--------+----------------------+ CIA Mid   144                                                 +----------+--------+-----+--------+--------+----------------------+ CIA Distal191                                                 +----------+--------+-----+--------+--------+----------------------+ EIA Prox  155                                                 +----------+--------+-----+--------+--------+----------------------+  EIA Mid   95                                                  +----------+--------+-----+--------+--------+----------------------+ EIA [id number]                                                 +----------+--------+-----+--------+--------+----------------------+ CFA Prox  153                   biphasic                       +----------+--------+-----+--------+--------+----------------------+ DFA       109                  biphasic                       +----------+--------+-----+--------+--------+----------------------+ SFA Prox  139                  biphasic                       +----------+--------+-----+--------+--------+----------------------+ SFA Mid   132                  biphasicSee stent measurements +----------+--------+-----+--------+--------+----------------------+ SFA Distal92                   biphasic                       +----------+--------+-----+--------+--------+----------------------+ POP Prox  72                   biphasic                       +----------+--------+-----+--------+--------+----------------------+ POP Distal35                   biphasic                       +----------+--------+-----+--------+--------+----------------------+  Left Stent(s): +---------------+--------+--------------+--------+--------+ mid SFA        PSV cm/sStenosis      WaveformComments +---------------+--------+--------------+--------+--------+ Prox to Stent  132     1-49% stenosisbiphasic         +---------------+--------+--------------+--------+--------+ Proximal Stent 114     1-49% stenosisbiphasic         +---------------+--------+--------------+--------+--------+ Mid Stent      109     1-49% stenosisbiphasic         +---------------+--------+--------------+--------+--------+ Distal Stent   104     1-49% stenosisbiphasic         +---------------+--------+--------------+--------+--------+ Distal to Stent114     1-49% stenosisbiphasic         +---------------+--------+--------------+--------+--------+  Aorta: +--------+-------+----------+----------+--------+--------+-----+         AP (cm)Trans (cm)PSV (cm/s)WaveformThrombusShape +--------+-------+----------+----------+--------+--------+-----+ Proximal                  121                             +--------+-------+----------+----------+--------+--------+-----+ Distal  115                             +--------+-------+----------+----------+--------+--------+-----+  Summary: Right: Near normal examination. Left: Near normal examination. Patent stent with no evidence of stenosis in the mid SFA artery.  See table(s) above for measurements and observations. Suggest follow up study in 12 months. Electronically signed by Ena Dawley MD on 04/16/2018 at 1:04:12 PM.    Final    VAS [place] AORTA/IVC/ILIACS  Result Date: 04/16/2018 ABDOMINAL AORTA STUDY Indications: S/P iliac artery angioplasty Other Factors: Patient denies claudication. Vascular Interventions: 08/2009, PTA of right CIA and EIA, atherectomy of right                         SFA and, left SFA stent.  Comparison Study: Normal aorta and iliac artery velocities, 10/2016 Performing Technologist: Pilar Jarvis RDMS, RVT, RDCS  Examination Guidelines: A complete evaluation includes B-mode imaging, spectral Doppler, color Doppler, and power Doppler as needed of all accessible portions of each vessel. Bilateral testing is considered an integral part of a complete examination. Limited examinations for reoccurring indications may be performed as noted.  Abdominal Aorta Findings: +-------------+-------+----------+----------+--------+--------+--------+ Location     AP (cm)Trans (cm)PSV (cm/s)WaveformThrombusComments +-------------+-------+----------+----------+--------+--------+--------+ Proximal                      121                                +-------------+-------+----------+----------+--------+--------+--------+ Distal                        115                                +-------------+-------+----------+----------+--------+--------+--------+ RT CIA Prox                   182                                 +-------------+-------+----------+----------+--------+--------+--------+ RT CIA Mid                    148       biphasic                 +-------------+-------+----------+----------+--------+--------+--------+ RT CIA Distal                 219       biphasic                 +-------------+-------+----------+----------+--------+--------+--------+ RT EIA Prox                   102       biphasic                 +-------------+-------+----------+----------+--------+--------+--------+ RT EIA Mid                    78        biphasic                 +-------------+-------+----------+----------+--------+--------+--------+ RT EIA Distal                 154  biphasic                 +-------------+-------+----------+----------+--------+--------+--------+ LT CIA Prox                   235       biphasic                 +-------------+-------+----------+----------+--------+--------+--------+ LT CIA Mid                    144       biphasic                 +-------------+-------+----------+----------+--------+--------+--------+ LT CIA Distal                 191       biphasic                 +-------------+-------+----------+----------+--------+--------+--------+ LT EIA Prox                   155       biphasic                 +-------------+-------+----------+----------+--------+--------+--------+ LT EIA Mid                    95        biphasic                 +-------------+-------+----------+----------+--------+--------+--------+ LT EIA Distal                 117       biphasic                 +-------------+-------+----------+----------+--------+--------+--------+ IVC/Iliac Findings: +-------+------+--------+--------+   IVC  PatentThrombusComments +-------+------+--------+--------+ IVC Midpatent                 +-------+------+--------+--------+  Summary: Abdominal Aorta: No evidence of an abdominal aortic aneurysm was  visualized. The largest aortic measurement is 1.7 cm. Stenosis: +------------------+-------------+ Location          Stenosis      +------------------+-------------+ Right Common Iliac>50% stenosis +------------------+-------------+ Left Common Iliac >50% stenosis +------------------+-------------+  *See table(s) above for measurements and observations. Suggest follow up study in 12 months.  Electronically signed by Ena Dawley MD on 04/16/2018 at 1:04:26 PM.    Final     Assessment & Plan:   There are no diagnoses linked to this encounter.   No orders of the defined types were placed in this encounter.    Follow-up: No follow-ups on file.  Walker Kehr, MD

## 2019-04-09 NOTE — Assessment & Plan Note (Signed)
[facility] at [facility]  Potential benefits of a long term opioids use as well as potential risks (i.e. addiction risk, apnea etc) and complications (i.e. Somnolence, constipation and others) were explained to the patient and were aknowledged.

## 2019-04-14 ENCOUNTER — Other Ambulatory Visit: Payer: Self-pay | Admitting: Internal Medicine

## 2019-04-19 NOTE — Progress Notes (Signed)
Chief Complaint  Patient presents with  . Follow-up    CAD    History of Present Illness: 79 yo male with history of CAD s/p CABG 1993 and redo 2016, PAD, hyperlipidemia, atrial fibrillation/flutter, COPD, ongoing tobacco abuse, prostate cancer s/p prostatectomy/radiation who is here today for cardiac follow up. He has been followed in our office since 2011. He has had PTA of the occluded left SFA and stent placement May 2011 followed by atherectomy of his right SFA in June 2011. His PAD has remained stable since then. ABI were normal January 2020.  He is known to have moderate carotid artery disease, stable by dopplers July 2018. His CAD remained quiet from cath in 1999 until January 2016 when he was admitted with a NSTEMI and atrial fib with RVR. Cardiac cath with critical left main stenosis, LAD and Circumflex disease. Vein graft to OM occluded and LIMA to LAD to atretic. He developed cardiogenic shock with ventricular fibrillation. PTCA of left main and circumflex was performed. CT surgery did not think he was a CABG candidate. He was then readmitted with a NSTEMI in February 2016 and underwent re-do CABG (RIMA-Intermediate, S-OM, S-PDA, S-dist LAD).He had atrial fib/flutter post-op. He was placed on Eliquis. He underwent TEE guided DCCV March 2016 but converted back to atrial fib after cardioversion.   He is here today for follow up. The patient denies any chest pain, dyspnea, palpitations, lower extremity edema, orthopnea, PND, dizziness, near syncope or syncope. He is feeling well. No leg pain. Still smoking.   Primary Care Physician: Cassandria Anger, MD  Past Medical History:  Diagnosis Date  . ALLERGIC RHINITIS   . Atrial flutter ([place])   . CAD (coronary artery disease)    CAD s/p CABG 1993 and redo 2016,  . Carotid artery disease (H[facility])    bilateral  . Chronic combined systolic and diastolic CHF (congestive heart failure) ([place])   . CKD (chronic kidney disease), stage II   .  COPD (chronic obstructive pulmonary disease) ([place])   . Diverticulosis   . Hyperlipidemia   . Osteoarthritis   . PAF (paroxysmal atrial fibrillation) ([place])   . Peripheral neuropathy   . Personal history of prostate cancer   . PVD (peripheral vascular disease) with claudication ([place])   . Radiation proctitis   . STEMI (ST elevation myocardial infarction) ([place]) 04/29/2014   PTCA L[place] and CFX, in setting of VF/VT arrest and CGS  . Tobacco use disorder, continuous   . Ventral hernia     Past Surgical History:  Procedure Laterality Date  . APPENDECTOMY    . CARDIOVERSION N/A 06/24/2014   Procedure: CARDIOVERSION;  Surgeon: Pixie Casino, MD;  Location: [facility];  Service: Cardiovascular;  Laterality: N/A;  . CORONARY ARTERY BYPASS GRAFT  1992  . CORONARY ARTERY BYPASS GRAFT N/A 05/28/2014   Procedure: REDO CORONARY ARTERY BYPASS GRAFTING (CABG);  Surgeon: Grace Isaac, MD;  Location: [facility];  Service: Open Heart Surgery;  Laterality: N/A;  Times 4 using right internal mammary artery to Intermediate artery and endoscopically harvested left saphenous vein to LAD, OM1, and PD coronary arteries.  Marland Kitchen LEFT HEART CATHETERIZATION WITH CORONARY ANGIOGRAM N/A 04/30/2014   Procedure: LEFT HEART CATHETERIZATION WITH CORONARY ANGIOGRAM;  Surgeon: Peter M Martinique, MD; [place]main 90% (s/p PTCA), LAD 80%, CFX 100% (s/p PTCA), RCA OK, LIMA-LAD atretic, SVG-OM 100% (chronic), EF 45%, pt req defib x 13 for VF/VT arrest, CHB w/ tem pacer, IABP and intubation  . LUMBAR LAMINECTOMY  [id number]    X2  . PROSTATECTOMY  05/2008   Dr. Alinda Money and XRT  . TEE WITHOUT CARDIOVERSION N/A 05/28/2014   Procedure: TRANSESOPHAGEAL ECHOCARDIOGRAM (TEE);  Surgeon: Grace Isaac, MD;  Location: [facility];  Service: Open Heart Surgery;  Laterality: N/A;  . TEE WITHOUT CARDIOVERSION N/A 06/24/2014   Procedure: TRANSESOPHAGEAL ECHOCARDIOGRAM (TEE);  Surgeon: Pixie Casino, MD;  Location: [facility] ENDOSCOPY;  Service: Cardiovascular;   Laterality: N/A;  . TONSILLECTOMY      Current Outpatient Medications  Medication Sig Dispense Refill  . aspirin EC 81 MG tablet Take 1 tablet (81 mg total) by mouth daily. 90 tablet 3  . atorvastatin (LIPITOR) 40 MG tablet TAKE 1 TABLET AT BEDTIME 90 tablet 4  . diclofenac Sodium (VOLTAREN) 1 % GEL Apply 1 application topically 4 (four) times daily. 100 g 3  . ELIQUIS 5 MG TABS tablet TAKE 1 TABLET TWICE A DAY 180 tablet 0  . fluticasone (FLONASE) 50 MCG/ACT nasal spray USE ONE SPRAY IN EACH NOSTRIL DAILY AS NEEDED FOR ALLERGIES OR RHINITIS 48 g 6  . folic acid (FOLVITE) 1 MG tablet TAKE 1 TABLET DAILY 90 tablet 3  . furosemide (LASIX) 20 MG tablet TAKE 1 TABLET EVERY OTHER DAY 90 tablet 3  . halobetasol (ULTRAVATE) 0.05 % ointment Apply topically 2 (two) times daily. 100 g 3  . loratadine (CLARITIN) 10 MG tablet Take 10 mg by mouth daily.    Marland Kitchen LORazepam (ATIVAN) 0.5 MG tablet     . metoprolol tartrate (LOPRESSOR) 50 MG tablet TAKE 1 TABLET TWICE A DAY 180 tablet 4  . nitroGLYCERIN (NITROSTAT) 0.4 MG SL tablet Place 0.4 mg under the tongue as needed. Chest pain    . pantoprazole (PROTONIX) 40 MG tablet TAKE 1 TABLET DAILY 90 tablet 3  . potassium chloride (KLOR-CON) 8 MEQ tablet TAKE 1 TABLET DAILY 90 tablet 3  . traMADol (ULTRAM) 50 MG tablet Take 1 tablet (50 mg total) by mouth every 6 (six) hours as needed for severe pain. Pain 120 tablet 1  . zolpidem (AMBIEN) 5 MG tablet Take 1 tablet (5 mg total) by mouth at bedtime. 90 tablet 1   No current facility-administered medications for this visit.    Allergies  Allergen Reactions  . Adhesive [Tape] Other (See Comments)    Takes skin off   . Codeine Sulfate Itching and Other (See Comments)    headaches    Social History   Socioeconomic History  . Marital status: Married    Spouse name: Not on file  . Number of children: 3  . Years of education: Not on file  . Highest education level: Not on file  Occupational History  .  Occupation: Retired-self employed    Employer: RETIRED  Tobacco Use  . Smoking status: Current Every Day Smoker    Years: 40.00    Types: Cigarettes  . Smokeless tobacco: Never Used  Substance and Sexual Activity  . Alcohol use: No  . Drug use: No  . Sexual activity: Not Currently  Other Topics Concern  . Not on file  Social History Narrative  . Not on file   Social Determinants of Health   Financial Resource Strain:   . Difficulty of Paying Living Expenses: Not on file  Food Insecurity:   . Worried About Charity fundraiser in the Last Year: Not on file  . Ran Out of Food in the Last Year: Not on file  Transportation Needs:   . Lack  of Transportation (Medical): Not on file  . Lack of Transportation (Non-Medical): Not on file  Physical Activity:   . Days of Exercise per Week: Not on file  . Minutes of Exercise per Session: Not on file  Stress:   . Feeling of Stress : Not on file  Social Connections:   . Frequency of Communication with Friends and Family: Not on file  . Frequency of Social Gatherings with Friends and Family: Not on file  . Attends Religious Services: Not on file  . Active Member of Clubs or Organizations: Not on file  . Attends Archivist Meetings: Not on file  . Marital Status: Not on file  Intimate Partner Violence:   . Fear of Current or Ex-Partner: Not on file  . Emotionally Abused: Not on file  . Physically Abused: Not on file  . Sexually Abused: Not on file    Family History  Problem Relation Age of Onset  . Heart disease Father   . Coronary artery disease Father   . Lung cancer Sister   . Heart disease Brother   . Hypertension Unknown     Review of Systems:  As stated in the HPI and otherwise negative.   BP 132/64   Pulse (!) 53   Ht 5\' 11"  (1.803 m)   Wt 171 lb 12.8 oz (77.9 kg)   SpO2 99%   BMI 23.96 kg/m   Physical Examination:  General: Well developed, well nourished, NAD  HEENT: OP clear, mucus membranes moist   SKIN: warm, dry. No rashes. Neuro: No focal deficits  Musculoskeletal: Muscle strength 5/5 all ext  Psychiatric: Mood and affect normal  Neck: No JVD, no carotid bruits, no thyromegaly, no lymphadenopathy.  Lungs:Clear bilaterally, no wheezes, rhonci, crackles Cardiovascular: Regular rate and rhythm. No murmurs, gallops or rubs. Abdomen:Soft. Bowel sounds present. Non-tender.  Extremities: No lower extremity edema. Pulses are 1+ in the bilateral DP/PT.  EKG:  EKG is ordered today. The ekg ordered today demonstrates sinus brady, rate 49 bpm  Recent Labs: 04/09/2019: BUN 15; Creatinine, Ser 1.10; Potassium 4.1; Sodium 140   Lipid Panel    Component Value Date/Time   CHOL 130 05/16/2017 1157   TRIG 119.0 05/16/2017 1157   TRIG 91 04/22/2006 0854   HDL 36.70 (L) 05/16/2017 1157   CHOLHDL 4 05/16/2017 1157   VLDL 23.8 05/16/2017 1157   LDLCALC 70 05/16/2017 1157     Wt Readings from Last 3 Encounters:  04/20/19 171 lb 12.8 oz (77.9 kg)  04/09/19 171 lb (77.6 kg)  01/07/19 172 lb (78 kg)     Other studies Reviewed: Additional studies/ records that were reviewed today include: . Review of the above records demonstrates:    Assessment and Plan:   1. CAD s/p CABG without angina: No chest pain. Continue ASA, beta blocker and statin.       2. PAD: No pain in legs. ABI stable January 2020. Repeat now.    3. Atrial fibrillation/flutter: Sinus today. Continue Eliquis and beta blocker.    4. Tobacco abuse: Despite having had two open heart procedures, severe PVD and COPD, he continues to smoke and does not wish to stop. Smoking cessation is once again advised but he has no desire to stop.   5. Carotid artery disease: Mild bilateral carotid artery disease by last dopplers in July 2018. Will repeat July 2021.    6. Chronic diastolic CHF: [id number] by echo 2016. Weight stable. No volume overload. Continue Lasix.  Current medicines are reviewed at length with the patient today.   The patient does not have concerns regarding medicines.  The following changes have been made:  no change  Labs/ tests ordered today include:   Orders Placed This Encounter  Procedures  . Hepatic function panel  . Lipid Profile  . EKG 12-Lead  . Carotid  . VAS [place] ABI WITH/WO TBI    Disposition:   FU with me in 6 months  Signed, Lauree Chandler, MD 04/20/2019 11:33 AM    [facility] [address], [place], [place]  [address] Phone: [phone]; Fax: [phone]

## 2019-04-20 ENCOUNTER — Encounter: Payer: Self-pay | Admitting: Cardiovascular Disease

## 2019-04-20 ENCOUNTER — Ambulatory Visit (INDEPENDENT_AMBULATORY_CARE_PROVIDER_SITE_OTHER): Payer: Medicare Other | Admitting: Cardiovascular Disease

## 2019-04-20 ENCOUNTER — Other Ambulatory Visit: Payer: Self-pay

## 2019-04-20 VITALS — BP 132/64 | HR 53 | Ht 71.0 in | Wt 171.8 lb

## 2019-04-20 DIAGNOSIS — I480 Paroxysmal atrial fibrillation: Secondary | ICD-10-CM

## 2019-04-20 DIAGNOSIS — Z720 Tobacco use: Secondary | ICD-10-CM | POA: Diagnosis not present

## 2019-04-20 DIAGNOSIS — I2510 Atherosclerotic heart disease of native coronary artery without angina pectoris: Secondary | ICD-10-CM | POA: Diagnosis not present

## 2019-04-20 DIAGNOSIS — I739 Peripheral vascular disease, unspecified: Secondary | ICD-10-CM

## 2019-04-20 DIAGNOSIS — I6523 Occlusion and stenosis of bilateral carotid arteries: Secondary | ICD-10-CM | POA: Diagnosis not present

## 2019-04-20 DIAGNOSIS — I5032 Chronic diastolic (congestive) heart failure: Secondary | ICD-10-CM

## 2019-04-20 NOTE — Patient Instructions (Addendum)
Medication Instructions:  No changes *If you need a refill on your cardiac medications before your next appointment, please call your pharmacy*  Lab Work: When you return for testing at the [facility] office, please come fasting for Lipids/liver function panel  If you have labs (blood work) drawn today and your tests are completely normal, you will receive your results only by: Marland Kitchen MyChart Message (if you have MyChart) OR . A paper copy in the mail If you have any lab test that is abnormal or we need to change your treatment, we will call you to review the results.  Testing/Procedures: Your physician has requested that you have a carotid duplex. This test is an ultrasound of the carotid arteries in your neck. It looks at blood flow through these arteries that supply the brain with blood. Allow one hour for this exam. There are no restrictions or special instructions.  Your physician has requested that you have a lower extremity arterial duplex. This test is an ultrasound of the arteries in the legs. It looks at arterial blood flow in the legs. Allow one hour for Lower Arterial scans. There are no restrictions or special instructions Your physician has requested that you have an ankle brachial index (ABI). During this test an ultrasound and blood pressure cuff are used to evaluate the arteries that supply legs with blood. Allow thirty minutes for this exam. There are no restrictions or special instructions.   Follow-Up: At [facility], you and your health needs are our priority.  As part of our continuing mission to provide you with exceptional heart care, we have created designated Provider Care Teams.  These Care Teams include your primary Cardiologist (physician) and Advanced Practice Providers (APPs -  Physician Assistants and Nurse Practitioners) who all work together to provide you with the care you need, when you need it.  Your next appointment:   6 month(s)  The format for your  next appointment:   Either In Person or Virtual  Provider:   You may see Lauree Chandler, MD or one of the following Advanced Practice Providers on your designated Care Team:    Melina Copa, PA-C  Ermalinda Barrios, PA-C   Other Instructions

## 2019-04-23 ENCOUNTER — Other Ambulatory Visit: Payer: Self-pay | Admitting: Cardiovascular Disease

## 2019-04-23 DIAGNOSIS — I6523 Occlusion and stenosis of bilateral carotid arteries: Secondary | ICD-10-CM

## 2019-04-29 ENCOUNTER — Other Ambulatory Visit (HOSPITAL_COMMUNITY): Payer: Self-pay | Admitting: Cardiovascular Disease

## 2019-04-29 ENCOUNTER — Ambulatory Visit (HOSPITAL_BASED_OUTPATIENT_CLINIC_OR_DEPARTMENT_OTHER)
Admission: RE | Admit: 2019-04-29 | Discharge: 2019-04-29 | Disposition: A | Discharge status: Home / Self Care | Payer: Medicare Other | Source: Ambulatory Visit | Attending: Cardiovascular Disease | Admitting: Cardiovascular Disease

## 2019-04-29 ENCOUNTER — Other Ambulatory Visit: Payer: Self-pay

## 2019-04-29 ENCOUNTER — Ambulatory Visit (HOSPITAL_COMMUNITY)
Admission: RE | Admit: 2019-04-29 | Discharge: 2019-04-29 | Disposition: A | Discharge status: Home / Self Care | Payer: Medicare Other | Source: Ambulatory Visit | Attending: Cardiology | Admitting: Cardiology

## 2019-04-29 DIAGNOSIS — Z720 Tobacco use: Secondary | ICD-10-CM | POA: Diagnosis not present

## 2019-04-29 DIAGNOSIS — I739 Peripheral vascular disease, unspecified: Secondary | ICD-10-CM

## 2019-04-29 DIAGNOSIS — I2510 Atherosclerotic heart disease of native coronary artery without angina pectoris: Secondary | ICD-10-CM

## 2019-04-29 DIAGNOSIS — I6523 Occlusion and stenosis of bilateral carotid arteries: Secondary | ICD-10-CM

## 2019-04-29 DIAGNOSIS — I480 Paroxysmal atrial fibrillation: Secondary | ICD-10-CM

## 2019-04-29 DIAGNOSIS — I5032 Chronic diastolic (congestive) heart failure: Secondary | ICD-10-CM | POA: Diagnosis not present

## 2019-05-04 ENCOUNTER — Ambulatory Visit: Payer: Medicare Other | Attending: Internal Medicine

## 2019-05-04 DIAGNOSIS — Z23 Encounter for immunization: Secondary | ICD-10-CM | POA: Insufficient documentation

## 2019-05-04 NOTE — Progress Notes (Signed)
   Covid-19 Vaccination Clinic  Name:  [name]    MRN: [id number] DOB: 09/09/1940  05/04/2019  Mr. [name] was observed post Covid-19 immunization for 15 minutes without incidence. He was provided with Vaccine Information Sheet and instruction to access the V-Safe system.   Mr. [name] was instructed to call 911 with any severe reactions post vaccine: Marland Kitchen Difficulty breathing  . Swelling of your face and throat  . A fast heartbeat  . A bad rash all over your body  . Dizziness and weakness    Immunizations Administered    Name Date Dose VIS Date Route   Pfizer COVID-19 Vaccine 05/04/2019  9:29 AM 0.3 mL 03/20/2019 Intramuscular   Manufacturer: [place]   Lot: [id number]   [place]: [id number]

## 2019-05-19 DIAGNOSIS — H35371 Puckering of macula, right eye: Secondary | ICD-10-CM | POA: Diagnosis not present

## 2019-05-19 DIAGNOSIS — Z9841 Cataract extraction status, right eye: Secondary | ICD-10-CM | POA: Diagnosis not present

## 2019-05-19 DIAGNOSIS — H35033 Hypertensive retinopathy, bilateral: Secondary | ICD-10-CM | POA: Diagnosis not present

## 2019-05-19 DIAGNOSIS — Z961 Presence of intraocular lens: Secondary | ICD-10-CM | POA: Diagnosis not present

## 2019-05-19 DIAGNOSIS — H2511 Age-related nuclear cataract, right eye: Secondary | ICD-10-CM | POA: Diagnosis not present

## 2019-05-19 DIAGNOSIS — H33102 Unspecified retinoschisis, left eye: Secondary | ICD-10-CM | POA: Diagnosis not present

## 2019-05-25 ENCOUNTER — Ambulatory Visit: Payer: Medicare Other | Attending: Internal Medicine

## 2019-05-25 DIAGNOSIS — Z23 Encounter for immunization: Secondary | ICD-10-CM | POA: Insufficient documentation

## 2019-05-25 NOTE — Progress Notes (Signed)
   Covid-19 Vaccination Clinic  Name:  [name]    MRN: [id number] DOB: February 04, 1941  05/25/2019  [name] was observed post Covid-19 immunization for 15 minutes without incidence. He was provided with Vaccine Information Sheet and instruction to access the V-Safe system.   [name] was instructed to call 911 with any severe reactions post vaccine: Marland Kitchen Difficulty breathing  . Swelling of your face and throat  . A fast heartbeat  . A bad rash all over your body  . Dizziness and weakness    Immunizations Administered    Name Date Dose VIS Date Route   Pfizer COVID-19 Vaccine 05/25/2019  9:35 AM 0.3 mL 03/20/2019 Intramuscular   Manufacturer: [place]   Lot: [id number]   [place]: [id number]

## 2019-07-05 ENCOUNTER — Other Ambulatory Visit: Payer: Self-pay | Admitting: Internal Medicine

## 2019-07-09 ENCOUNTER — Other Ambulatory Visit: Payer: Self-pay | Admitting: Internal Medicine

## 2019-07-13 ENCOUNTER — Other Ambulatory Visit: Payer: Self-pay | Admitting: Internal Medicine

## 2019-07-25 ENCOUNTER — Other Ambulatory Visit: Payer: Self-pay | Admitting: Internal Medicine

## 2019-07-30 ENCOUNTER — Telehealth: Payer: Self-pay | Admitting: Internal Medicine

## 2019-07-30 NOTE — Telephone Encounter (Signed)
Error

## 2019-08-04 ENCOUNTER — Other Ambulatory Visit: Payer: Self-pay | Admitting: Internal Medicine

## 2019-08-10 ENCOUNTER — Other Ambulatory Visit: Payer: Self-pay | Admitting: Internal Medicine

## 2019-08-19 ENCOUNTER — Other Ambulatory Visit: Payer: Self-pay | Admitting: Internal Medicine

## 2019-10-15 NOTE — Progress Notes (Signed)
Cardiology Office Note    Date:  10/20/2019   ID:  [name], DOB 1940-09-19, MRN [id number]  PCP:  Cassandria Anger, MD  Cardiologist: Lauree Chandler, MD EPS: None  Chief Complaint  Patient presents with  . Follow-up    History of Present Illness:  [name] is a 79 y.o. male with history of CAD s/p CABG 1993 and redo 2016,January 2016 when he was admitted with a NSTEMI and atrial fib with RVR. Cardiac cath with critical left main stenosis, LAD and Circumflex disease. Vein graft to OM occluded and LIMA to LAD to atretic. He developed cardiogenic shock with ventricular fibrillation. PTCA of left main and circumflex was performed. CT surgery did not think he was a CABG candidate. He was then readmitted with a NSTEMI in February 2016 and underwent re-do CABG (RIMA-Intermediate, S-OM, S-PDA, S-dist LAD). He had atrial fib/flutter post-op. He was placed on Eliquis. He underwent TEE guided DCCV March 2016 but converted back to atrial fib after cardioversion. hyperlipidemia, atrial fibrillation/flutter, COPD, ongoing tobacco abuse, prostate cancer s/p prostatectomy/radiation. PAD S/P PTA of the occluded left SFA and stent placement May 2011 followed by atherectomy of his right SFA in June 2011. His PAD has remained stable since then    Last saw [name] 05/2019 and still smoking.Repeat dopplers done 04/29/19 and plan for repeat in 1 yr.See below for details.  Patient comes in for f/u. Denies chest pain, palpitations. Gets tired with exertion but denies dyspnea or leg pain. Uses a walker. Not exercising regularly. Takes care of wife with alzheimer's. Smokes 1-2 cigarettes daily. Bruises easily on Eliquis but no bleeding.    Past Medical History:  Diagnosis Date  . ALLERGIC RHINITIS   . Atrial flutter ([place])   . CAD (coronary artery disease)    CAD s/p CABG 1993 and redo 2016,  . Carotid artery disease (HCC)    bilateral  . Chronic combined systolic and  diastolic CHF (congestive heart failure) ([place])   . CKD (chronic kidney disease), stage II   . COPD (chronic obstructive pulmonary disease) ([place])   . Diverticulosis   . Hyperlipidemia   . Osteoarthritis   . PAF (paroxysmal atrial fibrillation) ([place])   . Peripheral neuropathy   . Personal history of prostate cancer   . PVD (peripheral vascular disease) with claudication ([place])   . Radiation proctitis   . STEMI (ST elevation myocardial infarction) ([place]) 04/29/2014   PTCA Lmain and CFX, in setting of VF/VT arrest and CGS  . Tobacco use disorder, continuous   . Ventral hernia     Past Surgical History:  Procedure Laterality Date  . APPENDECTOMY    . CARDIOVERSION N/A 06/24/2014   Procedure: CARDIOVERSION;  Surgeon: Pixie Casino, MD;  Location: [place];  Service: Cardiovascular;  Laterality: N/A;  . CORONARY ARTERY BYPASS GRAFT  1992  . CORONARY ARTERY BYPASS GRAFT N/A 05/28/2014   Procedure: REDO CORONARY ARTERY BYPASS GRAFTING (CABG);  Surgeon: Grace Isaac, MD;  Location: [place];  Service: Open Heart Surgery;  Laterality: N/A;  Times 4 using right internal mammary artery to Intermediate artery and endoscopically harvested left saphenous vein to LAD, OM1, and PD coronary arteries.  Marland Kitchen LEFT HEART CATHETERIZATION WITH CORONARY ANGIOGRAM N/A 04/30/2014   Procedure: LEFT HEART CATHETERIZATION WITH CORONARY ANGIOGRAM;  Surgeon: Peter M Martinique, MD; [place] 90% (s/p PTCA), LAD 80%, CFX 100% (s/p PTCA), RCA OK, LIMA-LAD atretic, SVG-OM 100% (chronic), EF 45%, pt req defib x 13 for  VF/VT arrest, CHB w/ tem pacer, IABP and intubation  . LUMBAR LAMINECTOMY  [id number]    X2  . PROSTATECTOMY  05/2008   Dr. Alinda Money and XRT  . TEE WITHOUT CARDIOVERSION N/A 05/28/2014   Procedure: TRANSESOPHAGEAL ECHOCARDIOGRAM (TEE);  Surgeon: Grace Isaac, MD;  Location: [place];  Service: Open Heart Surgery;  Laterality: N/A;  . TEE WITHOUT CARDIOVERSION N/A 06/24/2014   Procedure: TRANSESOPHAGEAL ECHOCARDIOGRAM  (TEE);  Surgeon: Pixie Casino, MD;  Location: [facility] ENDOSCOPY;  Service: Cardiovascular;  Laterality: N/A;  . TONSILLECTOMY      Current Medications: Current Meds  Medication Sig  . aspirin EC 81 MG tablet Take 1 tablet (81 mg total) by mouth daily.  Marland Kitchen atorvastatin (LIPITOR) 40 MG tablet TAKE 1 TABLET AT BEDTIME  . diclofenac Sodium (VOLTAREN) 1 % GEL Apply 1 application topically 4 (four) times daily.  Marland Kitchen ELIQUIS 5 MG TABS tablet TAKE 1 TABLET TWICE A DAY  . fluticasone (FLONASE) 50 MCG/ACT nasal spray USE 1 SPRAY IN EACH NOSTRIL DAILY AS NEEDED FOR ALLERGIES OR RHINITIS  . folic acid (FOLVITE) 1 MG tablet TAKE 1 TABLET DAILY  . furosemide (LASIX) 20 MG tablet TAKE 1 TABLET EVERY OTHER DAY  . halobetasol (ULTRAVATE) 0.05 % ointment Apply topically 2 (two) times daily.  Marland Kitchen loratadine (CLARITIN) 10 MG tablet Take 10 mg by mouth daily.  Marland Kitchen LORazepam (ATIVAN) 0.5 MG tablet   . metoprolol tartrate (LOPRESSOR) 50 MG tablet TAKE 1 TABLET TWICE A DAY  . nitroGLYCERIN (NITROSTAT) 0.4 MG SL tablet Place 0.4 mg under the tongue as needed. Chest pain  . pantoprazole (PROTONIX) 40 MG tablet TAKE 1 TABLET DAILY  . potassium chloride (KLOR-CON) 8 MEQ tablet TAKE 1 TABLET DAILY  . traMADol (ULTRAM) 50 MG tablet Take 1 tablet (50 mg total) by mouth every 6 (six) hours as needed for severe pain. Pain  . zolpidem (AMBIEN) 5 MG tablet Take 1 tablet (5 mg total) by mouth at bedtime.     Allergies:   Adhesive [tape] and Codeine sulfate   Social History   Socioeconomic History  . Marital status: Married    Spouse name: Not on file  . Number of children: 3  . Years of education: Not on file  . Highest education level: Not on file  Occupational History  . Occupation: Retired-self employed    Employer: RETIRED  Tobacco Use  . Smoking status: Current Every Day Smoker    Years: 40.00    Types: Cigarettes  . Smokeless tobacco: Never Used  Vaping Use  . Vaping Use: Never used  Substance and Sexual  Activity  . Alcohol use: No  . Drug use: No  . Sexual activity: Not Currently  Other Topics Concern  . Not on file  Social History Narrative  . Not on file   Social Determinants of Health   Financial Resource Strain:   . Difficulty of Paying Living Expenses:   Food Insecurity:   . Worried About Charity fundraiser in the Last Year:   . Arboriculturist in the Last Year:   Transportation Needs:   . Film/video editor (Medical):   Marland Kitchen Lack of Transportation (Non-Medical):   Physical Activity:   . Days of Exercise per Week:   . Minutes of Exercise per Session:   Stress:   . Feeling of Stress :   Social Connections:   . Frequency of Communication with Friends and Family:   . Frequency of Social Gatherings  with Friends and Family:   . Attends Religious Services:   . Active Member of Clubs or Organizations:   . Attends Archivist Meetings:   Marland Kitchen Marital Status:      Family History:  The patient's family history includes Coronary artery disease in his father; Heart disease in his brother and father; Hypertension in his unknown relative; Lung cancer in his sister.   ROS:   Please see the history of present illness.    ROS All other systems reviewed and are negative.   PHYSICAL EXAM:   VS:  BP 126/66   Pulse (!) 45   Ht 5' 11.75" (1.822 m)   Wt 169 lb (76.7 kg)   SpO2 97%   BMI 23.08 kg/m   Physical Exam  YIF:OYDX, in no acute distress, smells of cigarette smoke Neck: bilateral carotid bruits,no JVD,  or masses Cardiac:RRR; 2/6 systolic murmur LSB  Respiratory: Decreased breath sounds throughout GI: soft, nontender, nondistended, + BS Ext: without cyanosis, clubbing, or edema, Good distal pulses bilaterally Neuro:  Alert and Oriented x 3 Psych: euthymic mood, full affect  Wt Readings from Last 3 Encounters:  10/20/19 169 lb (76.7 kg)  04/20/19 171 lb 12.8 oz (77.9 kg)  04/09/19 171 lb (77.6 kg)      Studies/Labs Reviewed:   EKG:  EKG is not ordered  today.   Recent Labs: 04/09/2019: BUN 15; Creatinine, Ser 1.10; Potassium 4.1; Sodium 140   Lipid Panel    Component Value Date/Time   CHOL 130 05/16/2017 1157   TRIG 119.0 05/16/2017 1157   TRIG 91 04/22/2006 0854   HDL 36.70 (L) 05/16/2017 1157   CHOLHDL 4 05/16/2017 1157   VLDL 23.8 05/16/2017 1157   LDLCALC 70 05/16/2017 1157    Additional studies/ records that were reviewed today include:  Aortic US 04/29/19  Summary:  Abdominal Aorta: The largest aortic measurement is 2.5 cm.  Stenosis: +------------------+-------------+-----------------+  Location          Stenosis     Comments           +------------------+-------------+-----------------+  Distal Aorta      <50% stenosis                   +------------------+-------------+-----------------+  Right Common Iliac<50% stenosishigh end of range  +------------------+-------------+-----------------+  Left Common Iliac <50% stenosishigh end of range  +------------------+-------------+-----------------+   Aorta-iliac atherosclerosis.     *See table(s) above for measurements and observations.  Suggest follow up study in 12 months.    Electronically signed by Ena Dawley MD on 04/29/2019 at 11:21:51 AM.    ABI's 1/20/21Summary:  Right: Resting right ankle-brachial index is within normal range. No  evidence of significant right lower extremity arterial disease. The right  toe-brachial index is abnormal.   Left: Resting left ankle-brachial index is within normal range. No  evidence of significant left lower extremity arterial disease. The left  toe-brachial index is abnormal.    Summary:   Atherosclerosis of the lower extremity arteries.   Right: 30-49% stenosis noted in the superficial femoral artery (high-end).   Left: Patent stent with <50% restenosis in the left superficial femoral  artery.     See table(s) above for measurements and observations.  See ABI and aorta-iliac duplex reports.     Carotid dopplers 04/29/19 Summary:  Right Carotid: Velocities in the right ICA are consistent with a 1-39%  stenosis.  The ECA appears >50% stenosed.   Left Carotid: Velocities in the left ICA are consistent with a 1-39%  stenosis.               Non-hemodynamically significant plaque <50% noted in the  CCA.   Vertebrals: Bilateral vertebral arteries demonstrate antegrade flow.  Subclavians: Normal flow hemodynamics were seen in bilateral subclavian               arteries.   *See table(s) above for measurements and observations.      Electronically signed by Ena Dawley MD on 04/29/2019 at 11:22:10 AM.        Final    Echo 2016 Study Conclusions   - Left ventricle: There was mild concentric hypertrophy. Systolic    function was mildly reduced. The estimated ejection fraction was    in the range of 45% to 50%. Inferoseptal hypokinesis to akinesis    - thinning of the myocardium.  - Aortic valve: Bioprosthetic aortic valve - appears to be    functioning normally.  - Aorta: Mild atheromatous disease.  - Mitral valve: There was mild regurgitation.  - Left atrium: The atrium was dilated. No evidence of thrombus in    the atrial cavity or appendage.  - Right atrium: No evidence of thrombus in the atrial cavity or    appendage.  - Atrial septum: No defect or patent foramen ovale was identified.   Impressions:   - Successful DCCV to sinus bradycardia.    ASSESSMENT:    1. Coronary artery disease involving coronary bypass graft of native heart without angina pectoris   2. PVD (peripheral vascular disease) ([place])   3. PAF (paroxysmal atrial fibrillation) ([place])   4. Bilateral carotid artery stenosis   5. Chronic diastolic CHF (congestive heart failure) ([place])   6. Tobacco abuse      PLAN:  In order of problems listed above:  CAD S/P redo CABG denies angina, on aspirin Lipitor metoprolol  PAD-dopplers stable 04/29/19  Atrial fib/flutter on  Eliquis and beta blocker now with bradycardia heart rate 45 and fatigue with little exertion.  Will decrease metoprolol to 25 mg twice daily until finished then switch to Toprol-XL 50 mg once daily.  We will try to get him a blood pressure cuff for him to monitor both blood pressure and pulse.  Mild carotid stenosis on Dopplers 08/275  Chronic diastolic CHF compensated  Tobacco abuse smoking cessation recommended  HLD-will check lipids today even though he's not fasting-so he doesn't have to come back(difficulty for him with wife with alzheimer's   Medication Adjustments/Labs and Tests Ordered: Current medicines are reviewed at length with the patient today.  Concerns regarding medicines are outlined above.  Medication changes, Labs and Tests ordered today are listed in the Patient Instructions below. There are no Patient Instructions on file for this visit.   Signed, Ermalinda Barrios, PA-C  10/20/2019 1:20 PM    [facility] [address], [place], [place]  [address] Phone: [phone]; Fax: [phone]

## 2019-10-20 ENCOUNTER — Other Ambulatory Visit: Payer: Self-pay

## 2019-10-20 ENCOUNTER — Ambulatory Visit (INDEPENDENT_AMBULATORY_CARE_PROVIDER_SITE_OTHER): Payer: Medicare Other | Admitting: Physician Assistant

## 2019-10-20 ENCOUNTER — Encounter: Payer: Self-pay | Admitting: Physician Assistant

## 2019-10-20 VITALS — BP 126/66 | HR 45 | Ht 71.75 in | Wt 169.0 lb

## 2019-10-20 DIAGNOSIS — I25810 Atherosclerosis of coronary artery bypass graft(s) without angina pectoris: Secondary | ICD-10-CM

## 2019-10-20 DIAGNOSIS — I739 Peripheral vascular disease, unspecified: Secondary | ICD-10-CM | POA: Diagnosis not present

## 2019-10-20 DIAGNOSIS — Z720 Tobacco use: Secondary | ICD-10-CM

## 2019-10-20 DIAGNOSIS — I5032 Chronic diastolic (congestive) heart failure: Secondary | ICD-10-CM

## 2019-10-20 DIAGNOSIS — I480 Paroxysmal atrial fibrillation: Secondary | ICD-10-CM

## 2019-10-20 DIAGNOSIS — I6523 Occlusion and stenosis of bilateral carotid arteries: Secondary | ICD-10-CM

## 2019-10-20 MED ORDER — METOPROLOL SUCCINATE ER 50 MG PO TB24: 50.0000 mg | ORAL_TABLET | Freq: Every day | ORAL | 3 refills | Status: DC

## 2019-10-20 NOTE — Addendum Note (Signed)
Addended by: Carylon Perches on: 10/20/2019 01:29 PM   Modules accepted: Orders

## 2019-10-20 NOTE — Patient Instructions (Signed)
Medication Instructions:  Your physician has recommended you make the following change in your medication:   Take Metoprolol Tartrate 25mg  twice a day until you run out then pick up new RX for Metoprolol Succinate 50mg  and take 50mg  daily.  *If you need a refill on your cardiac medications before your next appointment, please call your pharmacy*   Lab Work: CMET, CBC, LIPID Today  If you have labs (blood work) drawn today and your tests are completely normal, you will receive your results only by: Marland Kitchen MyChart Message (if you have MyChart) OR . A paper copy in the mail If you have any lab test that is abnormal or we need to change your treatment, we will call you to review the results.   Testing/Procedures: None   Follow-Up: At [facility], you and your health needs are our priority.  As part of our continuing mission to provide you with exceptional heart care, we have created designated Provider Care Teams.  These Care Teams include your primary Cardiologist (physician) and Advanced Practice Providers (APPs -  Physician Assistants and Nurse Practitioners) who all work together to provide you with the care you need, when you need it.  We recommend signing up for the patient portal called "MyChart".  Sign up information is provided on this After Visit Summary.  MyChart is used to connect with patients for Virtual Visits (Telemedicine).  Patients are able to view lab/test results, encounter notes, upcoming appointments, etc.  Non-urgent messages can be sent to your provider as well.   To learn more about what you can do with MyChart, go to [email or web address].    Your next appointment:   6 month(s)  The format for your next appointment:   In Person  Provider:   You may see Lauree Chandler, MD or one of the following Advanced Practice Providers on your designated Care Team:    Melina Copa, PA-C  Ermalinda Barrios, PA-C    Other Instructions None

## 2019-10-21 LAB — COMPREHENSIVE METABOLIC PANEL
ALT: 12 IU/L (ref 0–44)
AST: 13 IU/L (ref 0–40)
Albumin/Globulin Ratio: 2.1 (ref 1.2–2.2)
Albumin: 4.4 g/dL (ref 3.7–4.7)
Alkaline Phosphatase: 81 IU/L (ref 48–121)
BUN/Creatinine Ratio: 10 (ref 10–24)
BUN: 11 mg/dL (ref 8–27)
Bilirubin Total: 0.3 mg/dL (ref 0.0–1.2)
CO2: 27 mmol/L (ref 20–29)
Calcium: 9.6 mg/dL (ref 8.6–10.2)
Chloride: 97 mmol/L (ref 96–106)
Creatinine, Ser: 1.06 mg/dL (ref 0.76–1.27)
GFR calc Af Amer: 76 mL/min/{1.73_m2} (ref >59)
GFR calc non Af Amer: 66 mL/min/{1.73_m2} (ref >59)
Globulin, Total: 2.1 g/dL (ref 1.5–4.5)
Glucose: 91 mg/dL (ref 65–99)
Potassium: 5.1 mmol/L (ref 3.5–5.2)
Sodium: 138 mmol/L (ref 134–144)
Total Protein: 6.5 g/dL (ref 6.0–8.5)

## 2019-10-21 LAB — CBC
Hematocrit: 38.9 % (ref 37.5–51.0)
Hemoglobin: 13.7 g/dL (ref 13.0–17.7)
MCH: 34.2 pg — ABNORMAL HIGH (ref 26.6–33.0)
MCHC: 35.2 g/dL (ref 31.5–35.7)
MCV: 97 fL (ref 79–97)
Platelets: 231 10*3/uL (ref 150–450)
RBC: 4.01 x10E6/uL — ABNORMAL LOW (ref 4.14–5.80)
RDW: 11.9 % (ref 11.6–15.4)
WBC: 8.1 10*3/uL (ref 3.4–10.8)

## 2019-10-21 LAB — LIPID PANEL
Chol/HDL Ratio: 3.6 ratio (ref 0.0–5.0)
Cholesterol, Total: 119 mg/dL (ref 100–199)
HDL: 33 mg/dL — ABNORMAL LOW (ref >39)
LDL Chol Calc (NIH): 64 mg/dL (ref 0–99)
Triglycerides: 121 mg/dL (ref 0–149)
VLDL Cholesterol Cal: 22 mg/dL (ref 5–40)

## 2019-10-29 ENCOUNTER — Telehealth: Payer: Self-pay | Admitting: Physician Assistant

## 2019-10-29 NOTE — Telephone Encounter (Signed)
Pt c/o medication issue:  1. Name of Medication: metoprolol succinate (TOPROL-XL) 50 MG 24 hr tablet  2. How are you currently taking this medication (dosage and times per day)? 1 tablet daily  3. Are you having a reaction (difficulty breathing--STAT)? no  4. What is your medication issue? Patient states the perscription is different from what he was told it was going to be. He states the perscription was supposed to be 25 mg twice a day not 50mg  once a day. He would like to know why it is different.

## 2019-10-29 NOTE — Telephone Encounter (Signed)
Returned pt call. Pt informed that per Estella Husk, NP note from 7/13 OV:  Will decrease metoprolol to 25 mg twice daily until finished then switch to  Toprol-XL 50 mg once daily.  Clarified instruction w/ pt.  He states that makes sense.  He will finish using Lopressor 25 mg BID and switch to Toprol 50 mg when he completes current Lopressor 25 mg BID prescription bottle. Pt appreciates return call.

## 2019-11-02 ENCOUNTER — Other Ambulatory Visit: Payer: Self-pay | Admitting: Internal Medicine

## 2019-11-16 ENCOUNTER — Other Ambulatory Visit: Payer: Self-pay

## 2019-11-16 ENCOUNTER — Ambulatory Visit (INDEPENDENT_AMBULATORY_CARE_PROVIDER_SITE_OTHER): Payer: Medicare Other | Admitting: Internal Medicine

## 2019-11-16 ENCOUNTER — Encounter: Payer: Self-pay | Admitting: Internal Medicine

## 2019-11-16 VITALS — BP 164/64 | HR 49 | Temp 97.9°F | Resp 16 | Ht 71.75 in | Wt 168.0 lb

## 2019-11-16 DIAGNOSIS — I6523 Occlusion and stenosis of bilateral carotid arteries: Secondary | ICD-10-CM | POA: Diagnosis not present

## 2019-11-16 DIAGNOSIS — L0100 Impetigo, unspecified: Secondary | ICD-10-CM | POA: Diagnosis not present

## 2019-11-16 DIAGNOSIS — L309 Dermatitis, unspecified: Secondary | ICD-10-CM | POA: Diagnosis not present

## 2019-11-16 MED ORDER — FLUOCINONIDE-E 0.05 % EX CREA: 1.0000 "application " | TOPICAL_CREAM | Freq: Two times a day (BID) | CUTANEOUS | 2 refills | Status: DC

## 2019-11-16 MED ORDER — SIVEXTRO 200 MG PO TABS: 1.0000 | ORAL_TABLET | Freq: Every day | ORAL | 0 refills | Status: AC

## 2019-11-16 NOTE — Progress Notes (Signed)
Subjective:  Patient ID: [name], male    DOB: Nov 25, 1940  Age: 79 y.o. MRN: [id number]  CC: Rash  This visit occurred during the SARS-CoV-2 public health emergency.  Safety protocols were in place, including screening questions prior to the visit, additional usage of staff PPE, and extensive cleaning of exam room while observing appropriate contact time as indicated for disinfecting solutions.    HPI [name] presents for concerns about a rash on both hands and forearms.  Is been bothering him for about 3 weeks.  He frequently uses hand sanitizer.  He has not tried anything to treat this.  The areas are intensely itchy and some of them have become scabbed and drain a white fluid.  Outpatient Medications Prior to Visit  Medication Sig Dispense Refill  . aspirin EC 81 MG tablet Take 1 tablet (81 mg total) by mouth daily. 90 tablet 3  . atorvastatin (LIPITOR) 40 MG tablet TAKE 1 TABLET AT BEDTIME 90 tablet 3  . ELIQUIS 5 MG TABS tablet TAKE 1 TABLET TWICE A DAY 180 tablet 3  . fluticasone (FLONASE) 50 MCG/ACT nasal spray USE 1 SPRAY IN EACH NOSTRIL DAILY AS NEEDED FOR ALLERGIES OR RHINITIS 48 g 5  . folic acid (FOLVITE) 1 MG tablet TAKE 1 TABLET DAILY 90 tablet 3  . furosemide (LASIX) 20 MG tablet TAKE 1 TABLET EVERY OTHER DAY 90 tablet 3  . halobetasol (ULTRAVATE) 0.05 % ointment Apply topically 2 (two) times daily. 100 g 3  . loratadine (CLARITIN) 10 MG tablet Take 10 mg by mouth daily.    . metoprolol succinate (TOPROL-XL) 50 MG 24 hr tablet Take 1 tablet (50 mg total) by mouth daily. Take with or immediately following a meal. 90 tablet 3  . nitroGLYCERIN (NITROSTAT) 0.4 MG SL tablet Place 0.4 mg under the tongue as needed. Chest pain    . pantoprazole (PROTONIX) 40 MG tablet TAKE 1 TABLET DAILY 90 tablet 3  . potassium chloride (KLOR-CON) 8 MEQ tablet TAKE 1 TABLET DAILY 90 tablet 3  . traMADol (ULTRAM) 50 MG tablet Take 1 tablet (50 mg total) by  mouth every 6 (six) hours as needed for severe pain. Pain 120 tablet 1  . zolpidem (AMBIEN) 5 MG tablet Take 1 tablet (5 mg total) by mouth at bedtime. 90 tablet 1  . diclofenac Sodium (VOLTAREN) 1 % GEL Apply 1 application topically 4 (four) times daily. 100 g 3  . LORazepam (ATIVAN) 0.5 MG tablet      No facility-administered medications prior to visit.    ROS Review of Systems  Constitutional: Negative.  Negative for chills, fatigue and fever.  HENT: Negative.   Eyes: Negative.   Respiratory: Negative for cough and chest tightness.   Cardiovascular: Negative for chest pain, palpitations and leg swelling.  Gastrointestinal: Negative for abdominal pain, diarrhea and nausea.  Endocrine: Negative.   Genitourinary: Negative.  Negative for difficulty urinating.  Musculoskeletal: Negative for arthralgias and myalgias.  Skin: Positive for color change and rash.  Neurological: Negative.  Negative for dizziness.  Hematological: Negative for adenopathy. Does not bruise/bleed easily.  Psychiatric/Behavioral: Negative.     Objective:  BP (!) 164/64 (BP Location: Left Arm, Patient Position: Sitting, Cuff Size: Normal)   Pulse (!) 49   Temp 97.9 F (36.6 C) (Oral)   Resp 16   Ht 5' 11.75" (1.822 m)   Wt 168 lb (76.2 kg)   SpO2 96%  BMI 22.94 kg/m   BP Readings from Last 3 Encounters:  11/16/19 (!) 164/64  10/20/19 126/66  04/20/19 132/64    Wt Readings from Last 3 Encounters:  11/16/19 168 lb (76.2 kg)  10/20/19 169 lb (76.7 kg)  04/20/19 171 lb 12.8 oz (77.9 kg)    Physical Exam HENT:     Nose: Nose normal.  Eyes:     General: No scleral icterus.    Conjunctiva/sclera: Conjunctivae normal.  Cardiovascular:     Rate and Rhythm: Normal rate and regular rhythm.     Heart sounds: No murmur heard.   Pulmonary:     Effort: Pulmonary effort is normal.     Breath sounds: No stridor. No wheezing, rhonchi or rales.  Abdominal:     General: Abdomen is flat.     Tenderness:  There is no abdominal tenderness.  Musculoskeletal:        General: Normal range of motion.     Cervical back: Neck supple.  Lymphadenopathy:     Cervical: No cervical adenopathy.  Skin:    General: Skin is warm.     Findings: Rash present.     Comments: On both hands and forearms there are scattered area of papules that have coalesced into patches with excoriation, lichenification, faint scale, and a few areas have a dried tapioca colored exudate covering them with faint erythema.  There are no areas of induration or fluctuance.  There is no streaking.  See photos.  Neurological:     General: No focal deficit present.     Mental Status: He is alert.     Lab Results  Component Value Date   WBC 8.1 10/20/2019   HGB 13.7 10/20/2019   HCT 38.9 10/20/2019   PLT 231 10/20/2019   GLUCOSE 91 10/20/2019   CHOL 119 10/20/2019   TRIG 121 10/20/2019   HDL 33 (L) 10/20/2019   LDLCALC 64 10/20/2019   ALT 12 10/20/2019   AST 13 10/20/2019   NA 138 10/20/2019   K 5.1 10/20/2019   CL 97 10/20/2019   CREATININE 1.06 10/20/2019   BUN 11 10/20/2019   CO2 27 10/20/2019   TSH 0.51 05/16/2017   PSA 0.01 (L) 02/20/2012   INR 1.30 05/28/2014   HGBA1C 5.7 07/11/2006    VAS [place] ABI WITH/WO TBI  Result Date: 04/29/2019 LOWER EXTREMITY DOPPLER STUDY Indications: Peripheral artery disease f/u s/p interventions. Patient denies any              claudication symptoms, says he will sometimes get a sharp pain in              the left thigh but it is intermittent and can happen at rest or              while standing. High Risk Factors: Hyperlipidemia, current smoker, prior MI, coronary artery                    disease.  Vascular Interventions: S/P angioplasty of right CIA and EIA, atherectomy of the                         right SFA and stenting of the left SFA in 2011. Comparison Study: Previous ABI's in 1/20 were 1.01 on the right and 0.99 on the                   left. Performing Technologist: Mariane Masters RVT  Examination Guidelines: A complete evaluation includes at minimum, Doppler waveform signals and systolic blood pressure reading at the level of bilateral brachial, anterior tibial, and posterior tibial arteries, when vessel segments are accessible. Bilateral testing is considered an integral part of a complete examination. Photoelectric Plethysmograph (PPG) waveforms and toe systolic pressure readings are included as required and additional duplex testing as needed. Limited examinations for reoccurring indications may be performed as noted.  ABI Findings: +---------+------------------+-----+---------+--------+ Right    Rt Pressure (mmHg)IndexWaveform Comment  +---------+------------------+-----+---------+--------+ Brachial 149                                      +---------+------------------+-----+---------+--------+ ATA      143               0.96 biphasic          +---------+------------------+-----+---------+--------+ PTA      154               1.03 triphasic         +---------+------------------+-----+---------+--------+ PERO     142               0.95 biphasic          +---------+------------------+-----+---------+--------+ Great Toe98                0.66                   +---------+------------------+-----+---------+--------+ +---------+------------------+-----+---------------+-------+ Left     Lt Pressure (mmHg)IndexWaveform       Comment +---------+------------------+-----+---------------+-------+ Brachial 145                                           +---------+------------------+-----+---------------+-------+ ATA      142               0.95 monophasic             +---------+------------------+-----+---------------+-------+ PTA      150               1.01 biphasic               +---------+------------------+-----+---------------+-------+ PERO     132               0.89 barely biphasic         +---------+------------------+-----+---------------+-------+ Great Toe91                0.61                        +---------+------------------+-----+---------------+-------+ +-------+-----------+-----------+------------+------------+ ABI/TBIToday's ABIToday's TBIPrevious ABIPrevious TBI +-------+-----------+-----------+------------+------------+ Right  1.03       0.66       1.01        0.53         +-------+-----------+-----------+------------+------------+ Left   1.01       0.61       0.99        0.54         +-------+-----------+-----------+------------+------------+ Bilateral ABIs and TBIs appear essentially unchanged compared to prior study on 04/16/18.  Summary: Right: Resting right ankle-brachial index is within normal range. No evidence of significant right lower extremity arterial disease. The right toe-brachial index is abnormal. Left: Resting left ankle-brachial index is within normal range. No evidence of significant left  lower extremity arterial disease. The left toe-brachial index is abnormal.  *See table(s) above for measurements and observations. See arterial duplex and aorta-iliac duplex reports.  Suggest follow up study in 12 months. Electronically signed by Ena Dawley MD on 04/29/2019 at 11:19:22 AM.    Final    VAS US CAROTID  Result Date: 04/29/2019 Carotid Arterial Duplex Study Indications:       Carotid artery disease follow-up. Patient denies any                    cerebrovascular symptoms. Risk Factors:      Hyperlipidemia, current smoker, prior MI, coronary artery                    disease, PAD. Comparison Study:  Previous carotid duplex performed in 7/18 showed RICA                    velocities of 476/54 cm/sec and LICA velocities of 65/03                    cm/sec. Performing Technologist: Mariane Masters RVT  Examination Guidelines: A complete evaluation includes B-mode imaging, spectral Doppler, color Doppler, and power Doppler as needed of all  accessible portions of each vessel. Bilateral testing is considered an integral part of a complete examination. Limited examinations for reoccurring indications may be performed as noted.  Right Carotid Findings: +----------+--------+--------+--------+-------------------------+---------+           PSV cm/sEDV cm/sStenosisPlaque Description       Comments  +----------+--------+--------+--------+-------------------------+---------+ CCA Prox  61      6                                                  +----------+--------+--------+--------+-------------------------+---------+ CCA Distal61      10                                                 +----------+--------+--------+--------+-------------------------+---------+ ICA Prox  87      17      1-39%   heterogenous and calcificShadowing +----------+--------+--------+--------+-------------------------+---------+ ICA Mid   81      18                                                 +----------+--------+--------+--------+-------------------------+---------+ ICA Distal88      25                                                 +----------+--------+--------+--------+-------------------------+---------+ ECA       305     1       >50%                                       +----------+--------+--------+--------+-------------------------+---------+ +----------+--------+-------+----------------+-------------------+           PSV cm/sEDV cmsDescribe  Arm Pressure (mmHG) +----------+--------+-------+----------------+-------------------+ [id number]             Multiphasic, [id number]                 +----------+--------+-------+----------------+-------------------+ +---------+--------+--+--------+--+---------+ VertebralPSV cm/s49EDV cm/s10Antegrade +---------+--------+--+--------+--+---------+  Left Carotid Findings: +----------+--------+--------+--------+------------------+--------------+           PSV cm/sEDV  cm/sStenosisPlaque DescriptionComments       +----------+--------+--------+--------+------------------+--------------+ CCA Prox  63      8                                                +----------+--------+--------+--------+------------------+--------------+ CCA Mid   78      11      <50%    heterogenous                     +----------+--------+--------+--------+------------------+--------------+ CCA Distal74      13                                               +----------+--------+--------+--------+------------------+--------------+ ICA Prox  47      13              smooth            minimal plaque +----------+--------+--------+--------+------------------+--------------+ ICA Mid   66      18      1-39%                                    +----------+--------+--------+--------+------------------+--------------+ ICA Distal68      17                                               +----------+--------+--------+--------+------------------+--------------+ ECA       114     1                                                +----------+--------+--------+--------+------------------+--------------+ +----------+--------+--------+----------------+-------------------+           PSV cm/sEDV cm/sDescribe        Arm Pressure (mmHG) +----------+--------+--------+----------------+-------------------+ [id number]              Multiphasic, [id number]                 +----------+--------+--------+----------------+-------------------+ +---------+--------+--+--------+-+---------+ VertebralPSV cm/s40EDV cm/s8Antegrade +---------+--------+--+--------+-+---------+   Summary: Right Carotid: Velocities in the right ICA are consistent with a 1-39% stenosis.                The ECA appears >50% stenosed. Left Carotid: Velocities in the left ICA are consistent with a 1-39% stenosis.               Non-hemodynamically significant plaque <50% noted in the CCA. Vertebrals:  Bilateral  vertebral arteries demonstrate antegrade flow. Subclavians: Normal flow hemodynamics were seen in bilateral subclavian              arteries. *See table(s) above for  measurements and observations.  Electronically signed by Ena Dawley MD on 04/29/2019 at 11:22:10 AM.    Final    VAS [place] LOWER EXTREMITY ARTERIAL DUPLEX  Result Date: 04/29/2019 LOWER EXTREMITY ARTERIAL DUPLEX STUDY Indications: Peripheral artery disease follow-up, s/p interventions. Patient              denies any claudication symptoms, says he will sometimes get a              sharp pain in the left thigh but it is intermittent and and can              happen at rest or while standing. High Risk         Hyperlipidemia, current smoker, prior MI, coronary artery Factors:          disease.  Vascular Interventions: S/P angioplasty of the right CIA and EIA, atherectomy of                         the right SFA and stenting of the left SFA in 2011. Current ABI:            Today's ABIs were 1.03 on the right and 1.01 on the                         left. Comparison Study: In 1/20, an arterial duplex showed R SFA velocity of 163                   cm/sec and a patent left SFA stent with <50% restenosis. Performing Technologist: Mariane Masters RVT  Examination Guidelines: A complete evaluation includes B-mode imaging, spectral Doppler, color Doppler, and power Doppler as needed of all accessible portions of each vessel. Bilateral testing is considered an integral part of a complete examination. Limited examinations for reoccurring indications may be performed as noted. Aorta: +--------+-------+----------+----------+--------+--------+-----+         AP (cm)Trans (cm)PSV (cm/s)WaveformThrombusShape +--------+-------+----------+----------+--------+--------+-----+ Proximal                 55                              +--------+-------+----------+----------+--------+--------+-----+ Mid                      54                               +--------+-------+----------+----------+--------+--------+-----+ Distal                   127                             +--------+-------+----------+----------+--------+--------+-----+   +----------+--------+-----+---------------+--------+-----------+ RIGHT     PSV cm/sRatioStenosis       WaveformComments    +----------+--------+-----+---------------+--------+-----------+ CFA Prox  109                         biphasicplaque      +----------+--------+-----+---------------+--------+-----------+ DFA       163                         biphasic            +----------+--------+-----+---------------+--------+-----------+ SFA Prox  194          30-49% stenosisbiphasicplaque      +----------+--------+-----+---------------+--------+-----------+ SFA Mid   51                          biphasicplaque      +----------+--------+-----+---------------+--------+-----------+ SFA Distal220          30-49% stenosisbiphasicbased on VR +----------+--------+-----+---------------+--------+-----------+ POP Prox  58                          biphasic            +----------+--------+-----+---------------+--------+-----------+ POP Distal37                          biphasic            +----------+--------+-----+---------------+--------+-----------+ TP Trunk  36                          biphasic            +----------+--------+-----+---------------+--------+-----------+ A focal velocity elevation of 194 cm/s was obtained at SFA origin with a VR of 1.3. Findings are characteristic of 30-49% stenosis. A 2nd focal velocity elevation was visualized, measuring 220 cm/s at distal SFA with a VR of 1.9. Findings are characteristic of high-end 30-49% stenosis.  +----------+--------+-----+--------+---------+------------------------+ LEFT      PSV cm/sRatioStenosisWaveform Comments                 +----------+--------+-----+--------+---------+------------------------+ CFA Prox   94                   triphasicplaque                   +----------+--------+-----+--------+---------+------------------------+ DFA       307                  stenotic plaque/elevated velocity +----------+--------+-----+--------+---------+------------------------+ SFA Prox  100                  biphasic plaque                   +----------+--------+-----+--------+---------+------------------------+ POP Distal41                   biphasic                          +----------+--------+-----+--------+---------+------------------------+ TP Trunk  39                   biphasic                          +----------+--------+-----+--------+---------+------------------------+  Left Stent(s): +-------------------+--------+--------------+--------+--------+ left SFA mid-distalPSV cm/sStenosis      WaveformComments +-------------------+--------+--------------+--------+--------+ Prox to Stent      92                    biphasic         +-------------------+--------+--------------+--------+--------+ Proximal Stent     91                    biphasic         +-------------------+--------+--------------+--------+--------+ Mid Stent          123     1-49% stenosisbiphasic         +-------------------+--------+--------------+--------+--------+ Distal Stent  78                                     +-------------------+--------+--------------+--------+--------+ Distal to Stent    61                                     +-------------------+--------+--------------+--------+--------+ Heterogenous plaque throughout the stent without hemodynamically significant stenosis.   Summary: Atherosclerosis of the lower extremity arteries. Right: 30-49% stenosis noted in the superficial femoral artery (high-end). Left: Patent stent with <50% restenosis in the left superficial femoral artery.  See table(s) above for measurements and observations. See ABI and aorta-iliac duplex  reports. Suggest follow up study in 12 months. Electronically signed by Ena Dawley MD on 04/29/2019 at 11:49:06 AM.    Final    VAS US AORTA/IVC/ILIACS  Result Date: 04/29/2019 ABDOMINAL AORTA STUDY Indications: S/P right iliac artery angioplasty. Patient denies any claudication              symptoms. Risk Factors: Hyperlipidemia, current smoker, prior MI, coronary artery               disease. Other Factors: Today's ABIs are 1.03 on the right and 1.01 on the left. Vascular               PTA of the right common and external iliac arteries in Interventions:         2011. Limitations: Air/bowel gas.  Comparison Study: In 1/20, an aorta-iliac duplex showed low end >50% stenosis of                   the right and left common iliac arteries, PSV of 219 and 235                   cm/sec, respectively. Performing Technologist: Mariane Masters RVT  Examination Guidelines: A complete evaluation includes B-mode imaging, spectral Doppler, color Doppler, and power Doppler as needed of all accessible portions of each vessel. Bilateral testing is considered an integral part of a complete examination. Limited examinations for reoccurring indications may be performed as noted.  Abdominal Aorta Findings: +-------------+-------+----------+----------+--------+--------+----------------+ Location     AP (cm)Trans (cm)PSV (cm/s)WaveformThrombusComments         +-------------+-------+----------+----------+--------+--------+----------------+ Proximal     2.30   2.50      55        biphasic                         +-------------+-------+----------+----------+--------+--------+----------------+ Mid          1.90   1.90      54        biphasic        calcific plaque  +-------------+-------+----------+----------+--------+--------+----------------+ Distal       1.40   1.30      127       biphasic                         +-------------+-------+----------+----------+--------+--------+----------------+ RT  CIA Prox  1.0    1.0       165       biphasic                         +-------------+-------+----------+----------+--------+--------+----------------+ RT CIA Mid  192       biphasic                         +-------------+-------+----------+----------+--------+--------+----------------+ RT CIA Distal                 178       biphasic                         +-------------+-------+----------+----------+--------+--------+----------------+ RT EIA Prox                   88        biphasic                         +-------------+-------+----------+----------+--------+--------+----------------+ RT EIA Mid                    195       biphasic        <50% stenosis,                                                           high end of                                                              range            +-------------+-------+----------+----------+--------+--------+----------------+ RT EIA Distal                 187       biphasic                         +-------------+-------+----------+----------+--------+--------+----------------+ LT CIA Prox  0.7    0.8       195       biphasic        <50% stenosis,                                                           high end of                                                              range            +-------------+-------+----------+----------+--------+--------+----------------+ LT CIA Mid                    184       biphasic                         +-------------+-------+----------+----------+--------+--------+----------------+ LT CIA Distal  102       biphasic                         +-------------+-------+----------+----------+--------+--------+----------------+ LT EIA Prox                   136       biphasic                         +-------------+-------+----------+----------+--------+--------+----------------+ LT EIA  Mid                    93        biphasic                         +-------------+-------+----------+----------+--------+--------+----------------+ LT EIA Distal                 91        biphasic                         +-------------+-------+----------+----------+--------+--------+----------------+ Visualization of the Left CIA Distal artery and Left EIA Proximal artery was limited. IVC/Iliac Findings: +--------+------+--------+--------+   IVC   PatentThrombusComments +--------+------+--------+--------+ IVC Proxpatent                 +--------+------+--------+--------+    Summary: Abdominal Aorta: The largest aortic measurement is 2.5 cm. Stenosis: +------------------+-------------+-----------------+ Location          Stenosis     Comments          +------------------+-------------+-----------------+ Distal Aorta      <50% stenosis                  +------------------+-------------+-----------------+ Right Common Iliac<50% stenosishigh end of range +------------------+-------------+-----------------+ Left Common Iliac <50% stenosishigh end of range +------------------+-------------+-----------------+ Aorta-iliac atherosclerosis.  *See table(s) above for measurements and observations. Suggest follow up study in 12 months.  Electronically signed by Ena Dawley MD on 04/29/2019 at 11:21:51 AM.    Final     Assessment & Plan:   Nieko was seen today for rash.  Diagnoses and all orders for this visit:  Acute hand eczema-I have asked him to avoid frequent han  washing and the use of hand sanitizers.  Will treat with topical fluocinonide.-     fluocinonide-emollient (LIDEX-E) 0.05 % cream; Apply 1 application topically 2 (two) times daily.  Impetigo- Will treat for strep and staph with a 6-day course of tedizolid. -     Tedizolid Phosphate (SIVEXTRO) 200 MG TABS; Take 1 tablet by mouth daily for 6 days.   I have discontinued Hollice Espy C. Delaluz's LORazepam and  diclofenac Sodium. I am also having him start on fluocinonide-emollient and Sivextro. Additionally, I am having him maintain his nitroGLYCERIN, loratadine, halobetasol, traMADol, aspirin EC, zolpidem, folic acid, fluticasone, Eliquis, potassium chloride, pantoprazole, atorvastatin, metoprolol succinate, and furosemide.  Meds ordered this encounter  Medications  . fluocinonide-emollient (LIDEX-E) 0.05 % cream    Sig: Apply 1 application topically 2 (two) times daily.    Dispense:  60 g    Refill:  2  . Tedizolid Phosphate (SIVEXTRO) 200 MG TABS    Sig: Take 1 tablet by mouth daily for 6 days.    Dispense:  6 tablet    Refill:  0     Follow-up: Return in about 3 weeks (around 12/07/2019).  Scarlette Calico, MD

## 2019-11-16 NOTE — Patient Instructions (Signed)
Impetigo, Adult Impetigo is an infection of the skin. It commonly occurs in young children, but it can also occur in adults. The infection causes itchy blisters and sores that produce brownish-yellow fluid. As the fluid dries, it forms a thick, honey-colored crust. These skin changes usually occur on the face, but they can also affect other areas of the body. Impetigo usually goes away in 7-10 days with treatment. What are the causes? This condition is caused by two types of bacteria. It may be caused by staphylococci or streptococci bacteria. These bacteria cause impetigo when they get under the surface of the skin. This often happens after some damage to the skin, such as:  Cuts, scrapes, or scratches.  Rashes.  Insect bites, especially when you scratch the area of a bite.  Chickenpox or other illnesses that cause open skin sores.  Nail biting or chewing. Impetigo can spread easily from one person to another (is contagious). It may be spread through close skin contact or by sharing towels, clothing, or other items that an infected person has touched. What increases the risk? The following factors may make you more likely to develop this condition:  Playing sports that include skin-to-skin contact with others.  Having a skin condition with open sores, such as chickenpox.  Having diabetes.  Having a weak body defense system (immune system).  Having many skin cuts or scrapes.  Living in an area that has high humidity levels.  Having poor hygiene.  Having high levels of staphylococci in your nose. What are the signs or symptoms? The main symptom of this condition is small blisters, often on the face around the mouth and nose. In time, the blisters break open and turn into tiny sores (lesions) with a yellow crust. In some cases, the blisters cause itching or burning. With scratching, irritation, or lack of treatment, these small lesions may get larger. Other possible symptoms  include:  Larger blisters.  Pus.  Swollen lymph glands. Scratching the affected area can cause impetigo to spread to other parts of the body. The bacteria can get under the fingernails and spread when you touch another area of your skin. How is this diagnosed? This condition is usually diagnosed during a physical exam. A skin sample or a sample of fluid from a blister may be taken for lab tests that involve growing bacteria (culture test). Lab tests can help to confirm the diagnosis or help to determine the best treatment. How is this treated? Treatment for this condition depends on the severity of the condition:  Mild impetigo can be treated with prescription antibiotic cream.  Oral antibiotic medicine may be used in more severe cases.  Medicines that reduce itchiness (antihistamines)may also be used. Follow these instructions at home: Medicines  Take over-the-counter and prescription medicines only as told by your health care provider.  Apply or take your antibiotic as told by your health care provider. Do not stop using the antibiotic even if your condition improves. General instructions   To help prevent impetigo from spreading to other body areas: ? Keep your fingernails short and clean. ? Do not scratch the blisters or sores. ? Cover infected areas, if necessary, to keep from scratching. ? Wash your hands often with soap and warm water.  Before applying antibiotic cream or ointment, you should: ? Gently wash the infected areas with antibacterial soap and warm water. ? Soak crusted areas in warm, soapy water using antibacterial soap. ? Gently rub the areas to remove crusts. Do not  scrub.  Do not share towels.  Wash your clothing and bedsheets in warm water that is 140F (60C) or warmer.  Stay home until you have used an antibiotic cream for 48 hours (2 days) or an oral antibiotic medicine for 24 hours (1 day). You should only return to work and activities with other  people if your skin shows significant improvement. ? You may return to contact sports after you have used antibiotic medicine for 72 hours (3 days).  Keep all follow-up visits as told by your health care provider. This is important. How is this prevented?  Wash your hands often with soap and warm water.  Do not share towels, washcloths, clothing, bedding, or razors.  Keep your fingernails short.  Keep any cuts, scrapes, bug bites, or rashes clean and covered.  Use insect repellent to prevent bug bites. Contact a health care provider if:  You develop more blisters or sores even with treatment.  Other family members get sores.  Your skin sores are not improving after 72 hours (3 days) of treatment.  You have a fever. Get help right away if:  You see spreading redness or swelling of the skin around your sores.  You see red streaks coming from your sores.  You develop a sore throat.  The area around your rash becomes warm, red, or tender to the touch.  You have dark, reddish-brown urine.  You do not urinate often or you urinate small amounts.  You are very tired (lethargic).  You have swelling in the face, hands, or feet. Summary  Impetigo is a skin infection that causes itchy blisters and sores that produce brownish-yellow fluid. As the fluid dries, it forms a crust.  This condition is caused by staphylococci or streptococci bacteria. These bacteria cause impetigo when they get under the surface of the skin, such as through cuts, rashes, bug bites, or open sores.  Treatment for this condition may include antibiotic ointment or oral antibiotics.  To help prevent impetigo from spreading to other body areas, make sure you keep your fingernails short, avoid scratching, cover any blisters, and wash your hands often.  If you have impetigo, stay home until you have used an antibiotic cream for 48 hours (2 days) or an oral antibiotic medicine for 24 hours (1 day). You should  only return to work and activities with other people if your skin shows significant improvement. This information is not intended to replace advice given to you by your health care provider. Make sure you discuss any questions you have with your health care provider. Document Revised: 05/06/2018 Document Reviewed: 04/17/2016 [employer] Patient Education  2020 [employer]  

## 2019-11-18 DIAGNOSIS — H01131 Eczematous dermatitis of right upper eyelid: Secondary | ICD-10-CM | POA: Diagnosis not present

## 2019-11-18 DIAGNOSIS — H01132 Eczematous dermatitis of right lower eyelid: Secondary | ICD-10-CM | POA: Diagnosis not present

## 2020-01-12 ENCOUNTER — Ambulatory Visit: Payer: Medicare Other

## 2020-01-14 ENCOUNTER — Ambulatory Visit: Payer: Medicare Other | Attending: Internal Medicine

## 2020-01-14 ENCOUNTER — Other Ambulatory Visit: Payer: Self-pay

## 2020-01-14 DIAGNOSIS — Z23 Encounter for immunization: Secondary | ICD-10-CM

## 2020-01-14 NOTE — Progress Notes (Signed)
   [facility]  Name:  [name]    MRN: [id number] DOB: [birth year]  01/14/2020  [name] was observed post Covid-19 immunization for 30 minutes based on pre-vaccination screening without incident. He was provided with Vaccine Information Sheet and instruction to access the V-Safe system.   [name] was instructed to call 911 with any severe reactions post vaccine: Marland Kitchen Difficulty breathing  . Swelling of face and throat  . A fast heartbeat  . A bad rash all over body  . Dizziness and weakness

## 2020-01-23 ENCOUNTER — Ambulatory Visit: Payer: Medicare Other

## 2020-02-01 ENCOUNTER — Other Ambulatory Visit: Payer: Self-pay

## 2020-02-01 ENCOUNTER — Encounter: Payer: Self-pay | Admitting: Internal Medicine

## 2020-02-01 ENCOUNTER — Ambulatory Visit (INDEPENDENT_AMBULATORY_CARE_PROVIDER_SITE_OTHER): Payer: Medicare Other | Admitting: Internal Medicine

## 2020-02-01 VITALS — BP 130/60 | HR 56 | Temp 98.2°F | Ht 71.75 in | Wt 166.8 lb

## 2020-02-01 DIAGNOSIS — Z23 Encounter for immunization: Secondary | ICD-10-CM

## 2020-02-01 DIAGNOSIS — I6523 Occlusion and stenosis of bilateral carotid arteries: Secondary | ICD-10-CM | POA: Diagnosis not present

## 2020-02-01 DIAGNOSIS — I4892 Unspecified atrial flutter: Secondary | ICD-10-CM | POA: Diagnosis not present

## 2020-02-01 DIAGNOSIS — E782 Mixed hyperlipidemia: Secondary | ICD-10-CM | POA: Diagnosis not present

## 2020-02-01 DIAGNOSIS — C61 Malignant neoplasm of prostate: Secondary | ICD-10-CM

## 2020-02-01 LAB — CBC WITH DIFFERENTIAL/PLATELET
Basophils Absolute: 0.1 10*3/uL (ref 0.0–0.1)
Basophils Relative: 1.2 % (ref 0.0–3.0)
Eosinophils Absolute: 1 10*3/uL — ABNORMAL HIGH (ref 0.0–0.7)
Eosinophils Relative: 11.7 % — ABNORMAL HIGH (ref 0.0–5.0)
HCT: 41.1 % (ref 39.0–52.0)
Hemoglobin: 14.1 g/dL (ref 13.0–17.0)
Lymphocytes Relative: 14.6 % (ref 12.0–46.0)
Lymphs Abs: 1.2 10*3/uL (ref 0.7–4.0)
MCHC: 34.2 g/dL (ref 30.0–36.0)
MCV: 98.9 fl (ref 78.0–100.0)
Monocytes Absolute: 0.8 10*3/uL (ref 0.1–1.0)
Monocytes Relative: 8.8 % (ref 3.0–12.0)
Neutro Abs: 5.5 10*3/uL (ref 1.4–7.7)
Neutrophils Relative %: 63.7 % (ref 43.0–77.0)
Platelets: 228 10*3/uL (ref 150.0–400.0)
RBC: 4.16 Mil/uL — ABNORMAL LOW (ref 4.22–5.81)
RDW: 13 % (ref 11.5–15.5)
WBC: 8.6 10*3/uL (ref 4.0–10.5)

## 2020-02-01 LAB — URINALYSIS
Bilirubin Urine: NEGATIVE
Ketones, ur: NEGATIVE
Leukocytes,Ua: NEGATIVE
Nitrite: NEGATIVE
Specific Gravity, Urine: 1.01 (ref 1.000–1.030)
Total Protein, Urine: NEGATIVE
Urine Glucose: NEGATIVE
Urobilinogen, UA: 0.2 (ref 0.0–1.0)
pH: 7 (ref 5.0–8.0)

## 2020-02-01 LAB — COMPREHENSIVE METABOLIC PANEL
ALT: 14 U/L (ref 0–53)
AST: 17 U/L (ref 0–37)
Albumin: 4.2 g/dL (ref 3.5–5.2)
Alkaline Phosphatase: 72 U/L (ref 39–117)
BUN: 12 mg/dL (ref 6–23)
CO2: 32 mEq/L (ref 19–32)
Calcium: 9.7 mg/dL (ref 8.4–10.5)
Chloride: 100 mEq/L (ref 96–112)
Creatinine, Ser: 0.93 mg/dL (ref 0.40–1.50)
GFR: 77.24 mL/min (ref >60.00)
Glucose, Bld: 80 mg/dL (ref 70–99)
Potassium: 4.6 mEq/L (ref 3.5–5.1)
Sodium: 137 mEq/L (ref 135–145)
Total Bilirubin: 0.5 mg/dL (ref 0.2–1.2)
Total Protein: 6.1 g/dL (ref 6.0–8.3)

## 2020-02-01 LAB — LIPID PANEL
Cholesterol: 114 mg/dL (ref 0–200)
HDL: 34.8 mg/dL — ABNORMAL LOW (ref >39.00)
LDL Cholesterol: 46 mg/dL (ref 0–99)
NonHDL: 78.76
Total CHOL/HDL Ratio: 3
Triglycerides: 166 mg/dL — ABNORMAL HIGH (ref 0.0–149.0)
VLDL: 33.2 mg/dL (ref 0.0–40.0)

## 2020-02-01 LAB — PSA: PSA: 0 ng/mL — ABNORMAL LOW (ref 0.10–4.00)

## 2020-02-01 LAB — TSH: TSH: 1.96 u[IU]/mL (ref 0.35–4.50)

## 2020-02-01 NOTE — Progress Notes (Signed)
Subjective:  Patient ID: [name], male    DOB: 07-Jun-1940  Age: 79 y.o. MRN: [id number]  CC: Follow-up   HPI [name] presents for    Outpatient Medications Prior to Visit  Medication Sig Dispense Refill  . aspirin EC 81 MG tablet Take 1 tablet (81 mg total) by mouth daily. 90 tablet 3  . atorvastatin (LIPITOR) 40 MG tablet TAKE 1 TABLET AT BEDTIME 90 tablet 3  . ELIQUIS 5 MG TABS tablet TAKE 1 TABLET TWICE A DAY 180 tablet 3  . fluocinonide-emollient (LIDEX-E) 0.05 % cream Apply 1 application topically 2 (two) times daily. 60 g 2  . fluticasone (FLONASE) 50 MCG/ACT nasal spray USE 1 SPRAY IN EACH NOSTRIL DAILY AS NEEDED FOR ALLERGIES OR RHINITIS 48 g 5  . folic acid (FOLVITE) 1 MG tablet TAKE 1 TABLET DAILY 90 tablet 3  . furosemide (LASIX) 20 MG tablet TAKE 1 TABLET EVERY OTHER DAY 90 tablet 3  . halobetasol (ULTRAVATE) 0.05 % ointment Apply topically 2 (two) times daily. 100 g 3  . loratadine (CLARITIN) 10 MG tablet Take 10 mg by mouth daily.    . nitroGLYCERIN (NITROSTAT) 0.4 MG SL tablet Place 0.4 mg under the tongue as needed. Chest pain    . pantoprazole (PROTONIX) 40 MG tablet TAKE 1 TABLET DAILY 90 tablet 3  . potassium chloride (KLOR-CON) 8 MEQ tablet TAKE 1 TABLET DAILY 90 tablet 3  . traMADol (ULTRAM) 50 MG tablet Take 1 tablet (50 mg total) by mouth every 6 (six) hours as needed for severe pain. Pain 120 tablet 1  . zolpidem (AMBIEN) 5 MG tablet Take 1 tablet (5 mg total) by mouth at bedtime. 90 tablet 1  . metoprolol succinate (TOPROL-XL) 50 MG 24 hr tablet Take 1 tablet (50 mg total) by mouth daily. Take with or immediately following a meal. 90 tablet 3   No facility-administered medications prior to visit.    ROS: Review of Systems  Constitutional: Negative for appetite change, fatigue and unexpected weight change.  HENT: Negative for congestion, nosebleeds, sneezing, sore throat and trouble swallowing.   Eyes: Negative for itching and  visual disturbance.  Respiratory: Negative for cough.   Cardiovascular: Negative for chest pain, palpitations and leg swelling.  Gastrointestinal: Negative for abdominal distention, blood in stool, diarrhea and nausea.  Genitourinary: Negative for frequency and hematuria.  Musculoskeletal: Positive for arthralgias, back pain and gait problem. Negative for joint swelling and neck pain.  Skin: Negative for rash.  Neurological: Negative for dizziness, tremors, speech difficulty and weakness.  Psychiatric/Behavioral: Negative for agitation, dysphoric mood and sleep disturbance. The patient is not nervous/anxious.     Objective:  BP 130/60 (BP Location: Left Arm, Patient Position: Sitting, Cuff Size: Large)   Pulse (!) 56   Temp 98.2 F (36.8 C) (Oral)   Ht 5' 11.75" (1.822 m)   Wt 166 lb 12.8 oz (75.7 kg)   SpO2 97%   BMI 22.78 kg/m   BP Readings from Last 3 Encounters:  02/01/20 130/60  11/16/19 (!) 164/64  10/20/19 126/66    Wt Readings from Last 3 Encounters:  02/01/20 166 lb 12.8 oz (75.7 kg)  11/16/19 168 lb (76.2 kg)  10/20/19 169 lb (76.7 kg)    Physical Exam Constitutional:      General: He is not in acute distress.    Appearance: He is well-developed.     Comments: NAD  Eyes:     Conjunctiva/sclera: Conjunctivae normal.  Pupils: Pupils are equal, round, and reactive to light.  Neck:     Thyroid: No thyromegaly.     Vascular: No JVD.  Cardiovascular:     Rate and Rhythm: Normal rate and regular rhythm.     Heart sounds: Normal heart sounds. No murmur heard.  No friction rub. No gallop.   Pulmonary:     Effort: Pulmonary effort is normal. No respiratory distress.     Breath sounds: Normal breath sounds. No wheezing or rales.  Chest:     Chest wall: No tenderness.  Abdominal:     General: Bowel sounds are normal. There is no distension.     Palpations: Abdomen is soft. There is no mass.     Tenderness: There is no abdominal tenderness. There is no  guarding or rebound.  Musculoskeletal:        General: No tenderness. Normal range of motion.     Cervical back: Normal range of motion.  Lymphadenopathy:     Cervical: No cervical adenopathy.  Skin:    General: Skin is warm and dry.     Findings: No rash.  Neurological:     Mental Status: He is alert and oriented to person, place, and time.     Cranial Nerves: No cranial nerve deficit.     Motor: Weakness present. No abnormal muscle tone.     Coordination: Coordination normal.     Gait: Gait abnormal.     Deep Tendon Reflexes: Reflexes are normal and symmetric.  Psychiatric:        Behavior: Behavior normal.        Thought Content: Thought content normal.        Judgment: Judgment normal.   walker LS painful  Lab Results  Component Value Date   WBC 8.1 10/20/2019   HGB 13.7 10/20/2019   HCT 38.9 10/20/2019   PLT 231 10/20/2019   GLUCOSE 91 10/20/2019   CHOL 119 10/20/2019   TRIG 121 10/20/2019   HDL 33 (L) 10/20/2019   LDLCALC 64 10/20/2019   ALT 12 10/20/2019   AST 13 10/20/2019   NA 138 10/20/2019   K 5.1 10/20/2019   CL 97 10/20/2019   CREATININE 1.06 10/20/2019   BUN 11 10/20/2019   CO2 27 10/20/2019   TSH 0.51 05/16/2017   PSA 0.01 (L) 02/20/2012   INR 1.30 05/28/2014   HGBA1C 5.7 07/11/2006    VAS [place] ABI WITH/WO TBI  Result Date: 04/29/2019 LOWER EXTREMITY DOPPLER STUDY Indications: Peripheral artery disease f/u s/p interventions. Patient denies any              claudication symptoms, says he will sometimes get a sharp pain in              the left thigh but it is intermittent and can happen at rest or              while standing. High Risk Factors: Hyperlipidemia, current smoker, prior MI, coronary artery                    disease.  Vascular Interventions: S/P angioplasty of right CIA and EIA, atherectomy of the                         right SFA and stenting of the left SFA in 2011. Comparison Study: Previous ABI's in 1/20 were 1.01 on the right and 0.99 on  the  left. Performing Technologist: Mariane Masters RVT  Examination Guidelines: A complete evaluation includes at minimum, Doppler waveform signals and systolic blood pressure reading at the level of bilateral brachial, anterior tibial, and posterior tibial arteries, when vessel segments are accessible. Bilateral testing is considered an integral part of a complete examination. Photoelectric Plethysmograph (PPG) waveforms and toe systolic pressure readings are included as required and additional duplex testing as needed. Limited examinations for reoccurring indications may be performed as noted.  ABI Findings: +---------+------------------+-----+---------+--------+ Right    Rt Pressure (mmHg)IndexWaveform Comment  +---------+------------------+-----+---------+--------+ Brachial 149                                      +---------+------------------+-----+---------+--------+ ATA      143               0.96 biphasic          +---------+------------------+-----+---------+--------+ PTA      154               1.03 triphasic         +---------+------------------+-----+---------+--------+ PERO     142               0.95 biphasic          +---------+------------------+-----+---------+--------+ Great Toe98                0.66                   +---------+------------------+-----+---------+--------+ +---------+------------------+-----+---------------+-------+ Left     Lt Pressure (mmHg)IndexWaveform       Comment +---------+------------------+-----+---------------+-------+ Brachial 145                                           +---------+------------------+-----+---------------+-------+ ATA      142               0.95 monophasic             +---------+------------------+-----+---------------+-------+ PTA      150               1.01 biphasic               +---------+------------------+-----+---------------+-------+ PERO     132               0.89  barely biphasic        +---------+------------------+-----+---------------+-------+ Great Toe91                0.61                        +---------+------------------+-----+---------------+-------+ +-------+-----------+-----------+------------+------------+ ABI/TBIToday's ABIToday's TBIPrevious ABIPrevious TBI +-------+-----------+-----------+------------+------------+ Right  1.03       0.66       1.01        0.53         +-------+-----------+-----------+------------+------------+ Left   1.01       0.61       0.99        0.54         +-------+-----------+-----------+------------+------------+ Bilateral ABIs and TBIs appear essentially unchanged compared to prior study on 04/16/18.  Summary: Right: Resting right ankle-brachial index is within normal range. No evidence of significant right lower extremity arterial disease. The right toe-brachial index is abnormal. Left: Resting left ankle-brachial index is within  normal range. No evidence of significant left lower extremity arterial disease. The left toe-brachial index is abnormal.  *See table(s) above for measurements and observations. See arterial duplex and aorta-iliac duplex reports.  Suggest follow up study in 12 months. Electronically signed by Ena Dawley MD on 04/29/2019 at 11:19:22 AM.    Final    VAS US CAROTID  Result Date: 04/29/2019 Carotid Arterial Duplex Study Indications:       Carotid artery disease follow-up. Patient denies any                    cerebrovascular symptoms. Risk Factors:      Hyperlipidemia, current smoker, prior MI, coronary artery                    disease, PAD. Comparison Study:  Previous carotid duplex performed in 7/18 showed RICA                    velocities of 096/28 cm/sec and LICA velocities of 36/62                    cm/sec. Performing Technologist: Mariane Masters RVT  Examination Guidelines: A complete evaluation includes B-mode imaging, spectral Doppler, color Doppler, and power  Doppler as needed of all accessible portions of each vessel. Bilateral testing is considered an integral part of a complete examination. Limited examinations for reoccurring indications may be performed as noted.  Right Carotid Findings: +----------+--------+--------+--------+-------------------------+---------+           PSV cm/sEDV cm/sStenosisPlaque Description       Comments  +----------+--------+--------+--------+-------------------------+---------+ CCA Prox  61      6                                                  +----------+--------+--------+--------+-------------------------+---------+ CCA Distal61      10                                                 +----------+--------+--------+--------+-------------------------+---------+ ICA Prox  87      17      1-39%   heterogenous and calcificShadowing +----------+--------+--------+--------+-------------------------+---------+ ICA Mid   81      18                                                 +----------+--------+--------+--------+-------------------------+---------+ ICA Distal88      25                                                 +----------+--------+--------+--------+-------------------------+---------+ ECA       305     1       >50%                                       +----------+--------+--------+--------+-------------------------+---------+ +----------+--------+-------+----------------+-------------------+  PSV cm/sEDV cmsDescribe        Arm Pressure (mmHG) +----------+--------+-------+----------------+-------------------+ [id number]             Multiphasic, [id number]                 +----------+--------+-------+----------------+-------------------+ +---------+--------+--+--------+--+---------+ VertebralPSV cm/s49EDV cm/s10Antegrade +---------+--------+--+--------+--+---------+  Left Carotid Findings: +----------+--------+--------+--------+------------------+--------------+            PSV cm/sEDV cm/sStenosisPlaque DescriptionComments       +----------+--------+--------+--------+------------------+--------------+ CCA Prox  63      8                                                +----------+--------+--------+--------+------------------+--------------+ CCA Mid   78      11      <50%    heterogenous                     +----------+--------+--------+--------+------------------+--------------+ CCA Distal74      13                                               +----------+--------+--------+--------+------------------+--------------+ ICA Prox  47      13              smooth            minimal plaque +----------+--------+--------+--------+------------------+--------------+ ICA Mid   66      18      1-39%                                    +----------+--------+--------+--------+------------------+--------------+ ICA Distal68      17                                               +----------+--------+--------+--------+------------------+--------------+ ECA       114     1                                                +----------+--------+--------+--------+------------------+--------------+ +----------+--------+--------+----------------+-------------------+           PSV cm/sEDV cm/sDescribe        Arm Pressure (mmHG) +----------+--------+--------+----------------+-------------------+ [id number]              Multiphasic, [id number]                 +----------+--------+--------+----------------+-------------------+ +---------+--------+--+--------+-+---------+ VertebralPSV cm/s40EDV cm/s8Antegrade +---------+--------+--+--------+-+---------+   Summary: Right Carotid: Velocities in the right ICA are consistent with a 1-39% stenosis.                The ECA appears >50% stenosed. Left Carotid: Velocities in the left ICA are consistent with a 1-39% stenosis.               Non-hemodynamically significant plaque <50% noted in the  CCA. Vertebrals:  Bilateral vertebral arteries demonstrate antegrade flow. Subclavians: Normal flow hemodynamics were seen in bilateral subclavian  arteries. *See table(s) above for measurements and observations.  Electronically signed by Ena Dawley MD on 04/29/2019 at 11:22:10 AM.    Final    VAS [place] LOWER EXTREMITY ARTERIAL DUPLEX  Result Date: 04/29/2019 LOWER EXTREMITY ARTERIAL DUPLEX STUDY Indications: Peripheral artery disease follow-up, s/p interventions. Patient              denies any claudication symptoms, says he will sometimes get a              sharp pain in the left thigh but it is intermittent and and can              happen at rest or while standing. High Risk         Hyperlipidemia, current smoker, prior MI, coronary artery Factors:          disease.  Vascular Interventions: S/P angioplasty of the right CIA and EIA, atherectomy of                         the right SFA and stenting of the left SFA in 2011. Current ABI:            Today's ABIs were 1.03 on the right and 1.01 on the                         left. Comparison Study: In 1/20, an arterial duplex showed R SFA velocity of 163                   cm/sec and a patent left SFA stent with <50% restenosis. Performing Technologist: Mariane Masters RVT  Examination Guidelines: A complete evaluation includes B-mode imaging, spectral Doppler, color Doppler, and power Doppler as needed of all accessible portions of each vessel. Bilateral testing is considered an integral part of a complete examination. Limited examinations for reoccurring indications may be performed as noted. Aorta: +--------+-------+----------+----------+--------+--------+-----+         AP (cm)Trans (cm)PSV (cm/s)WaveformThrombusShape +--------+-------+----------+----------+--------+--------+-----+ Proximal                 55                              +--------+-------+----------+----------+--------+--------+-----+ Mid                      54                               +--------+-------+----------+----------+--------+--------+-----+ Distal                   127                             +--------+-------+----------+----------+--------+--------+-----+   +----------+--------+-----+---------------+--------+-----------+ RIGHT     PSV cm/sRatioStenosis       WaveformComments    +----------+--------+-----+---------------+--------+-----------+ CFA Prox  109                         biphasicplaque      +----------+--------+-----+---------------+--------+-----------+ DFA       163                         biphasic            +----------+--------+-----+---------------+--------+-----------+  SFA Prox  194          30-49% stenosisbiphasicplaque      +----------+--------+-----+---------------+--------+-----------+ SFA Mid   51                          biphasicplaque      +----------+--------+-----+---------------+--------+-----------+ SFA Distal220          30-49% stenosisbiphasicbased on VR +----------+--------+-----+---------------+--------+-----------+ POP Prox  58                          biphasic            +----------+--------+-----+---------------+--------+-----------+ POP Distal37                          biphasic            +----------+--------+-----+---------------+--------+-----------+ TP Trunk  36                          biphasic            +----------+--------+-----+---------------+--------+-----------+ A focal velocity elevation of 194 cm/s was obtained at SFA origin with a VR of 1.3. Findings are characteristic of 30-49% stenosis. A 2nd focal velocity elevation was visualized, measuring 220 cm/s at distal SFA with a VR of 1.9. Findings are characteristic of high-end 30-49% stenosis.  +----------+--------+-----+--------+---------+------------------------+ LEFT      PSV cm/sRatioStenosisWaveform Comments                  +----------+--------+-----+--------+---------+------------------------+ CFA Prox  94                   triphasicplaque                   +----------+--------+-----+--------+---------+------------------------+ DFA       307                  stenotic plaque/elevated velocity +----------+--------+-----+--------+---------+------------------------+ SFA Prox  100                  biphasic plaque                   +----------+--------+-----+--------+---------+------------------------+ POP Distal41                   biphasic                          +----------+--------+-----+--------+---------+------------------------+ TP Trunk  39                   biphasic                          +----------+--------+-----+--------+---------+------------------------+  Left Stent(s): +-------------------+--------+--------------+--------+--------+ left SFA mid-distalPSV cm/sStenosis      WaveformComments +-------------------+--------+--------------+--------+--------+ Prox to Stent      92                    biphasic         +-------------------+--------+--------------+--------+--------+ Proximal Stent     91                    biphasic         +-------------------+--------+--------------+--------+--------+ Mid Stent          123     1-49% stenosisbiphasic         +-------------------+--------+--------------+--------+--------+  Distal Stent       78                                     +-------------------+--------+--------------+--------+--------+ Distal to Stent    61                                     +-------------------+--------+--------------+--------+--------+ Heterogenous plaque throughout the stent without hemodynamically significant stenosis.   Summary: Atherosclerosis of the lower extremity arteries. Right: 30-49% stenosis noted in the superficial femoral artery (high-end). Left: Patent stent with <50% restenosis in the left superficial femoral artery.  See  table(s) above for measurements and observations. See ABI and aorta-iliac duplex reports. Suggest follow up study in 12 months. Electronically signed by Ena Dawley MD on 04/29/2019 at 11:49:06 AM.    Final    VAS US AORTA/IVC/ILIACS  Result Date: 04/29/2019 ABDOMINAL AORTA STUDY Indications: S/P right iliac artery angioplasty. Patient denies any claudication              symptoms. Risk Factors: Hyperlipidemia, current smoker, prior MI, coronary artery               disease. Other Factors: Today's ABIs are 1.03 on the right and 1.01 on the left. Vascular               PTA of the right common and external iliac arteries in Interventions:         2011. Limitations: Air/bowel gas.  Comparison Study: In 1/20, an aorta-iliac duplex showed low end >50% stenosis of                   the right and left common iliac arteries, PSV of 219 and 235                   cm/sec, respectively. Performing Technologist: Mariane Masters RVT  Examination Guidelines: A complete evaluation includes B-mode imaging, spectral Doppler, color Doppler, and power Doppler as needed of all accessible portions of each vessel. Bilateral testing is considered an integral part of a complete examination. Limited examinations for reoccurring indications may be performed as noted.  Abdominal Aorta Findings: +-------------+-------+----------+----------+--------+--------+----------------+ Location     AP (cm)Trans (cm)PSV (cm/s)WaveformThrombusComments         +-------------+-------+----------+----------+--------+--------+----------------+ Proximal     2.30   2.50      55        biphasic                         +-------------+-------+----------+----------+--------+--------+----------------+ Mid          1.90   1.90      54        biphasic        calcific plaque  +-------------+-------+----------+----------+--------+--------+----------------+ Distal       1.40   1.30      127       biphasic                          +-------------+-------+----------+----------+--------+--------+----------------+ RT CIA Prox  1.0    1.0       165       biphasic                         +-------------+-------+----------+----------+--------+--------+----------------+  RT CIA Mid                    192       biphasic                         +-------------+-------+----------+----------+--------+--------+----------------+ RT CIA Distal                 178       biphasic                         +-------------+-------+----------+----------+--------+--------+----------------+ RT EIA Prox                   88        biphasic                         +-------------+-------+----------+----------+--------+--------+----------------+ RT EIA Mid                    195       biphasic        <50% stenosis,                                                           high end of                                                              range            +-------------+-------+----------+----------+--------+--------+----------------+ RT EIA Distal                 187       biphasic                         +-------------+-------+----------+----------+--------+--------+----------------+ LT CIA Prox  0.7    0.8       195       biphasic        <50% stenosis,                                                           high end of                                                              range            +-------------+-------+----------+----------+--------+--------+----------------+ LT CIA Mid                    184       biphasic                         +-------------+-------+----------+----------+--------+--------+----------------+  LT CIA Distal                 102       biphasic                         +-------------+-------+----------+----------+--------+--------+----------------+ LT EIA Prox                   136       biphasic                          +-------------+-------+----------+----------+--------+--------+----------------+ LT EIA Mid                    93        biphasic                         +-------------+-------+----------+----------+--------+--------+----------------+ LT EIA Distal                 91        biphasic                         +-------------+-------+----------+----------+--------+--------+----------------+ Visualization of the Left CIA Distal artery and Left EIA Proximal artery was limited. IVC/Iliac Findings: +--------+------+--------+--------+   IVC   PatentThrombusComments +--------+------+--------+--------+ IVC Proxpatent                 +--------+------+--------+--------+    Summary: Abdominal Aorta: The largest aortic measurement is 2.5 cm. Stenosis: +------------------+-------------+-----------------+ Location          Stenosis     Comments          +------------------+-------------+-----------------+ Distal Aorta      <50% stenosis                  +------------------+-------------+-----------------+ Right Common Iliac<50% stenosishigh end of range +------------------+-------------+-----------------+ Left Common Iliac <50% stenosishigh end of range +------------------+-------------+-----------------+ Aorta-iliac atherosclerosis.  *See table(s) above for measurements and observations. Suggest follow up study in 12 months.  Electronically signed by Ena Dawley MD on 04/29/2019 at 11:21:51 AM.    Final     Assessment & Plan:   [name] was seen today for follow-up.  Diagnoses and all orders for this visit:  Needs flu shot -     Flu Vaccine QUAD High Dose(Fluad)     No orders of the defined types were placed in this encounter.    Follow-up: No follow-ups on file.  Walker Kehr, MD

## 2020-02-01 NOTE — Assessment & Plan Note (Signed)
PSA

## 2020-02-01 NOTE — Patient Instructions (Signed)
You can try [place] Mushroom extract or capsules for memory   

## 2020-04-15 ENCOUNTER — Other Ambulatory Visit (HOSPITAL_COMMUNITY): Payer: Self-pay | Admitting: Cardiovascular Disease

## 2020-04-15 DIAGNOSIS — I739 Peripheral vascular disease, unspecified: Secondary | ICD-10-CM

## 2020-04-28 ENCOUNTER — Ambulatory Visit (HOSPITAL_COMMUNITY)
Admission: RE | Admit: 2020-04-28 | Discharge: 2020-04-28 | Disposition: A | Discharge status: Home / Self Care | Payer: Medicare Other | Source: Ambulatory Visit | Attending: Cardiology | Admitting: Cardiology

## 2020-04-28 ENCOUNTER — Other Ambulatory Visit (HOSPITAL_COMMUNITY): Payer: Self-pay | Admitting: Cardiovascular Disease

## 2020-04-28 ENCOUNTER — Ambulatory Visit (HOSPITAL_BASED_OUTPATIENT_CLINIC_OR_DEPARTMENT_OTHER)
Admission: RE | Admit: 2020-04-28 | Discharge: 2020-04-28 | Disposition: A | Discharge status: Home / Self Care | Payer: Medicare Other | Source: Ambulatory Visit | Attending: Cardiology | Admitting: Cardiology

## 2020-04-28 ENCOUNTER — Other Ambulatory Visit: Payer: Self-pay

## 2020-04-28 ENCOUNTER — Ambulatory Visit (HOSPITAL_COMMUNITY)
Admission: RE | Admit: 2020-04-28 | Payer: Medicare Other | Source: Ambulatory Visit | Attending: Cardiovascular Disease | Admitting: Cardiovascular Disease

## 2020-04-28 DIAGNOSIS — I739 Peripheral vascular disease, unspecified: Secondary | ICD-10-CM

## 2020-04-28 DIAGNOSIS — Z95828 Presence of other vascular implants and grafts: Secondary | ICD-10-CM

## 2020-05-03 ENCOUNTER — Other Ambulatory Visit: Payer: Self-pay

## 2020-05-03 ENCOUNTER — Ambulatory Visit (INDEPENDENT_AMBULATORY_CARE_PROVIDER_SITE_OTHER): Payer: Medicare Other | Admitting: Internal Medicine

## 2020-05-03 ENCOUNTER — Encounter: Payer: Self-pay | Admitting: Internal Medicine

## 2020-05-03 DIAGNOSIS — M5440 Lumbago with sciatica, unspecified side: Secondary | ICD-10-CM

## 2020-05-03 DIAGNOSIS — W009XXA Unspecified fall due to ice and snow, initial encounter: Secondary | ICD-10-CM | POA: Insufficient documentation

## 2020-05-03 DIAGNOSIS — Z9861 Coronary angioplasty status: Secondary | ICD-10-CM | POA: Diagnosis not present

## 2020-05-03 DIAGNOSIS — I739 Peripheral vascular disease, unspecified: Secondary | ICD-10-CM

## 2020-05-03 DIAGNOSIS — G8929 Other chronic pain: Secondary | ICD-10-CM

## 2020-05-03 DIAGNOSIS — I2510 Atherosclerotic heart disease of native coronary artery without angina pectoris: Secondary | ICD-10-CM

## 2020-05-03 NOTE — Progress Notes (Signed)
Subjective:  Patient ID: [name], male    DOB: 10-16-1940  Age: 80 y.o. MRN: [id number]  CC: Follow-up (3 month f/u)   HPI [place] presents for CAD, PVD, anticoagulation Pt fell twice on ice last wk. No LOC  Outpatient Medications Prior to Visit  Medication Sig Dispense Refill  . aspirin EC 81 MG tablet Take 1 tablet (81 mg total) by mouth daily. 90 tablet 3  . atorvastatin (LIPITOR) 40 MG tablet TAKE 1 TABLET AT BEDTIME 90 tablet 3  . ELIQUIS 5 MG TABS tablet TAKE 1 TABLET TWICE A DAY 180 tablet 3  . fluocinonide-emollient (LIDEX-E) 0.05 % cream Apply 1 application topically 2 (two) times daily. 60 g 2  . fluticasone (FLONASE) 50 MCG/ACT nasal spray USE 1 SPRAY IN EACH NOSTRIL DAILY AS NEEDED FOR ALLERGIES OR RHINITIS 48 g 5  . folic acid (FOLVITE) 1 MG tablet TAKE 1 TABLET DAILY 90 tablet 3  . furosemide (LASIX) 20 MG tablet TAKE 1 TABLET EVERY OTHER DAY 90 tablet 3  . halobetasol (ULTRAVATE) 0.05 % ointment Apply topically 2 (two) times daily. 100 g 3  . loratadine (CLARITIN) 10 MG tablet Take 10 mg by mouth daily.    . metoprolol succinate (TOPROL-XL) 50 MG 24 hr tablet Take 1 tablet (50 mg total) by mouth daily. Take with or immediately following a meal. 90 tablet 3  . nitroGLYCERIN (NITROSTAT) 0.4 MG SL tablet Place 0.4 mg under the tongue as needed. Chest pain    . pantoprazole (PROTONIX) 40 MG tablet TAKE 1 TABLET DAILY 90 tablet 3  . potassium chloride (KLOR-CON) 8 MEQ tablet TAKE 1 TABLET DAILY 90 tablet 3  . traMADol (ULTRAM) 50 MG tablet Take 1 tablet (50 mg total) by mouth every 6 (six) hours as needed for severe pain. Pain 120 tablet 1  . zolpidem (AMBIEN) 5 MG tablet Take 1 tablet (5 mg total) by mouth at bedtime. 90 tablet 1   No facility-administered medications prior to visit.    ROS: Review of Systems  Constitutional: Positive for fatigue. Negative for appetite change and unexpected weight change.  HENT: Negative for congestion,  nosebleeds, sneezing, sore throat and trouble swallowing.   Eyes: Negative for itching and visual disturbance.  Respiratory: Negative for cough.   Cardiovascular: Negative for chest pain, palpitations and leg swelling.  Gastrointestinal: Negative for abdominal distention, blood in stool, diarrhea and nausea.  Genitourinary: Negative for frequency and hematuria.  Musculoskeletal: Positive for arthralgias, back pain, gait problem, neck pain and neck stiffness. Negative for joint swelling.  Skin: Negative for rash.  Neurological: Negative for dizziness, tremors, speech difficulty and weakness.  Psychiatric/Behavioral: Negative for agitation, dysphoric mood and sleep disturbance. The patient is not nervous/anxious.     Objective:  BP 122/68 (BP Location: Left Arm)   Pulse 65   Temp 98.5 F (36.9 C) (Oral)   Wt 166 lb 9.6 oz (75.6 kg)   SpO2 97%   BMI 22.75 kg/m   BP Readings from Last 3 Encounters:  05/03/20 122/68  02/01/20 130/60  11/16/19 (!) 164/64    Wt Readings from Last 3 Encounters:  05/03/20 166 lb 9.6 oz (75.6 kg)  02/01/20 166 lb 12.8 oz (75.7 kg)  11/16/19 168 lb (76.2 kg)    Physical Exam Constitutional:      General: He is not in acute distress.    Appearance: He is well-developed.     Comments: NAD  HENT:     Mouth/Throat:  Mouth: Oropharynx is clear and moist.  Eyes:     Conjunctiva/sclera: Conjunctivae normal.     Pupils: Pupils are equal, round, and reactive to light.  Neck:     Thyroid: No thyromegaly.     Vascular: No JVD.  Cardiovascular:     Rate and Rhythm: Normal rate and regular rhythm.     Pulses: Intact distal pulses.     Heart sounds: Normal heart sounds. No murmur heard. No friction rub. No gallop.   Pulmonary:     Effort: Pulmonary effort is normal. No respiratory distress.     Breath sounds: Normal breath sounds. No wheezing or rales.  Chest:     Chest wall: No tenderness.  Abdominal:     General: Bowel sounds are normal. There  is no distension.     Palpations: Abdomen is soft. There is no mass.     Tenderness: There is no abdominal tenderness. There is no guarding or rebound.  Musculoskeletal:        General: Tenderness present. No edema. Normal range of motion.     Cervical back: Normal range of motion.  Lymphadenopathy:     Cervical: No cervical adenopathy.  Skin:    General: Skin is warm and dry.     Findings: No rash.  Neurological:     Mental Status: He is alert and oriented to person, place, and time.     Cranial Nerves: No cranial nerve deficit.     Motor: No abnormal muscle tone.     Coordination: He displays a negative Romberg sign. Coordination normal.     Gait: Gait abnormal.     Deep Tendon Reflexes: Reflexes are normal and symmetric.  Psychiatric:        Mood and Affect: Mood and affect normal.        Behavior: Behavior normal.        Thought Content: Thought content normal.        Judgment: Judgment normal.    A walker Ataxic  Lab Results  Component Value Date   WBC 8.6 02/01/2020   HGB 14.1 02/01/2020   HCT 41.1 02/01/2020   PLT 228.0 02/01/2020   GLUCOSE 80 02/01/2020   CHOL 114 02/01/2020   TRIG 166.0 (H) 02/01/2020   HDL 34.80 (L) 02/01/2020   LDLCALC 46 02/01/2020   ALT 14 02/01/2020   AST 17 02/01/2020   NA 137 02/01/2020   K 4.6 02/01/2020   CL 100 02/01/2020   CREATININE 0.93 02/01/2020   BUN 12 02/01/2020   CO2 32 02/01/2020   TSH 1.96 02/01/2020   PSA 0.00 (L) 02/01/2020   INR 1.30 05/28/2014   HGBA1C 5.7 07/11/2006    VAS [place] ABI WITH/WO TBI  Result Date: 04/28/2020 LOWER EXTREMITY DOPPLER STUDY Indications: Peripheral artery disease, and Patient indicates he has bilateral              leg pain due to his back issues. He recently fell twice over the              last 2 days on ice in his driveway. He uses a cane and walker,              unable to determine claudication symptoms. He denies rest pain. High Risk Factors: Hyperlipidemia, current smoker, prior MI,  coronary artery                    disease. Other Factors: SEE AORTOILIAC AND BILATERAL LEG ARTERIAL DUPLEX  REPORTS.  Vascular Interventions: 2011 right CIA and EIA angioplasty, right SFA                         atherectomy and left SFA stent. Comparison Study: Prior ABI 04/29/2019 Right 1.03 Left 1.01 Performing Technologist: Salvadore Dom RVT  Examination Guidelines: A complete evaluation includes at minimum, Doppler waveform signals and systolic blood pressure reading at the level of bilateral brachial, anterior tibial, and posterior tibial arteries, when vessel segments are accessible. Bilateral testing is considered an integral part of a complete examination. Photoelectric Plethysmograph (PPG) waveforms and toe systolic pressure readings are included as required and additional duplex testing as needed. Limited examinations for reoccurring indications may be performed as noted.  ABI Findings: +---------+------------------+-----+---------+--------+ Right    Rt Pressure (mmHg)IndexWaveform Comment  +---------+------------------+-----+---------+--------+ Brachial 148                                      +---------+------------------+-----+---------+--------+ ATA      124               0.84 biphasic          +---------+------------------+-----+---------+--------+ PTA      144               0.97 triphasic         +---------+------------------+-----+---------+--------+ PERO     132               0.89 triphasic         +---------+------------------+-----+---------+--------+ Great Toe86                0.58 Abnormal          +---------+------------------+-----+---------+--------+ +---------+------------------+-----+----------+-------+ Left     Lt Pressure (mmHg)IndexWaveform  Comment +---------+------------------+-----+----------+-------+ Brachial 139                                      +---------+------------------+-----+----------+-------+ ATA      111                0.75 monophasic        +---------+------------------+-----+----------+-------+ PTA      143               0.97 triphasic         +---------+------------------+-----+----------+-------+ PERO     114               0.77 biphasic          +---------+------------------+-----+----------+-------+ Heloise Ochoa                0.59 Abnormal          +---------+------------------+-----+----------+-------+ +-------+-----------+-----------+------------+------------+ ABI/TBIToday's ABIToday's TBIPrevious ABIPrevious TBI +-------+-----------+-----------+------------+------------+ Right  .97        .58        1.03        .66          +-------+-----------+-----------+------------+------------+ Left   .97        .59        1.01        .61          +-------+-----------+-----------+------------+------------+ Bilateral ABIs and TBIs appear essentially unchanged compared to prior study on 04/29/2019.  Summary: Right: The right toe-brachial index is abnormal. Although ankle brachial indices are within normal limits (0.95-1.29), arterial Doppler  waveforms at the ankle suggest some component of arterial occlusive disease. Left: The left toe-brachial index is abnormal. Although ankle brachial indices are within normal limits (0.95-1.29), arterial Doppler waveforms at the ankle suggest some component of arterial occlusive disease.  *See table(s) above for measurements and observations.  Suggest follow up study in 12 months. Electronically signed by Quay Burow MD on 04/28/2020 at 1:45:07 PM.    Final    VAS [place] LOWER EXTREMITY ARTERIAL DUPLEX  Result Date: 04/28/2020 LOWER EXTREMITY ARTERIAL DUPLEX STUDY Indications: Peripheral artery disease, and Patient indicates he has bilateral              leg pain due to his back issues. He recently fell twice over the              last 2 days on ice in his driveway. He uses a cane and walker,              unable to determine claudication symptoms. He denies  rest pain. High Risk         Hyperlipidemia, current smoker, prior MI, coronary artery Factors:          disease. Other Factors: SEE ABI AND AORTOILIAC DUPLEX REPORTS.  Vascular Interventions: 2011 right CIA and EIA angioplasty, right SFA                         atherectomy and left SFA stent. Current ABI:            Right .97 Left .59 Comparison Study: Prior bilateral arterial duplex exam on 04/29/2019 showed                   highest velocities in right distal SFA at 220 cm/s. Left mid                   SFA stent at 123 cm/s. Performing Technologist: Salvadore Dom RVT, RDCS (AE), RDMS  Examination Guidelines: A complete evaluation includes B-mode imaging, spectral Doppler, color Doppler, and power Doppler as needed of all accessible portions of each vessel. Bilateral testing is considered an integral part of a complete examination. Limited examinations for reoccurring indications may be performed as noted. Aorta: +--------+-------+----------+----------+--------+--------+-----+         AP (cm)Trans (cm)PSV (cm/s)WaveformThrombusShape +--------+-------+----------+----------+--------+--------+-----+ Proximal                 68                              +--------+-------+----------+----------+--------+--------+-----+ Distal                   93                              +--------+-------+----------+----------+--------+--------+-----+   +----------+--------+-----+---------------+--------+---------+ RIGHT     PSV cm/sRatioStenosis       WaveformComments  +----------+--------+-----+---------------+--------+---------+ CIA Prox  189                                           +----------+--------+-----+---------------+--------+---------+ CIA Mid   171                                           +----------+--------+-----+---------------+--------+---------+  CIA Distal193                                            +----------+--------+-----+---------------+--------+---------+ EIA Prox  139                                           +----------+--------+-----+---------------+--------+---------+ EIA Mid   202                                           +----------+--------+-----+---------------+--------+---------+ EIA Distal85                                            +----------+--------+-----+---------------+--------+---------+ CFA Prox  130                         biphasic          +----------+--------+-----+---------------+--------+---------+ CFA Distal128                         biphasic          +----------+--------+-----+---------------+--------+---------+ DFA       140                         biphasic          +----------+--------+-----+---------------+--------+---------+ SFA Prox  212          30-49% stenosisbiphasic          +----------+--------+-----+---------------+--------+---------+ SFA Mid   207          30-49% stenosisbiphasicshadowing +----------+--------+-----+---------------+--------+---------+ SFA Distal112                         biphasic          +----------+--------+-----+---------------+--------+---------+ POP Prox  80                          biphasic          +----------+--------+-----+---------------+--------+---------+ POP Distal41                          biphasic          +----------+--------+-----+---------------+--------+---------+ TP Trunk  45                          biphasic          +----------+--------+-----+---------------+--------+---------+ A focal velocity elevation of 212 cm/s was obtained at PRX SFA with a VR of 1.7. Findings are characteristic of 30-49% stenosis. A 2nd focal velocity elevation was visualized, measuring 207 cm/s at MID SFA with a VR of 2.1. Findings are characteristic of  30-49% stenosis. There are areas of shadowing which a higher grade stenosis or occlusion can not be ruled out.   +----------+--------+-----+---------------+--------+-----------------+ LEFT      PSV cm/sRatioStenosis       WaveformComments          +----------+--------+-----+---------------+--------+-----------------+ CIA Prox  188                                                   +----------+--------+-----+---------------+--------+-----------------+  CIA Mid   191                                                   +----------+--------+-----+---------------+--------+-----------------+ CIA Distal120                                                   +----------+--------+-----+---------------+--------+-----------------+ EIA Prox  182                                                   +----------+--------+-----+---------------+--------+-----------------+ EIA Distal87                                                    +----------+--------+-----+---------------+--------+-----------------+ CFA Prox  120                         biphasic                  +----------+--------+-----+---------------+--------+-----------------+ CFA Distal192          30-49% stenosisbiphasic                  +----------+--------+-----+---------------+--------+-----------------+ DFA       78                          biphasicatypical waveform +----------+--------+-----+---------------+--------+-----------------+ SFA Prox  131                         biphasic                  +----------+--------+-----+---------------+--------+-----------------+ SFA Mid   90                          biphasic                  +----------+--------+-----+---------------+--------+-----------------+ POP Prox  58                          biphasic                  +----------+--------+-----+---------------+--------+-----------------+ POP Distal37                          biphasic                  +----------+--------+-----+---------------+--------+-----------------+ TP Trunk  40                           biphasic                  +----------+--------+-----+---------------+--------+-----------------+ A focal velocity elevation of 192 cm/s was obtained at DST CFA with a VR of 1.6.  Left Stent(s): +---------------+--------+--------------+--------+--------+ MID-DST SFA    PSV cm/sStenosis  WaveformComments +---------------+--------+--------------+--------+--------+ Prox to Stent  122                   biphasic         +---------------+--------+--------------+--------+--------+ Proximal Stent 133     1-49% stenosisbiphasic         +---------------+--------+--------------+--------+--------+ Mid Stent      115                   biphasic         +---------------+--------+--------------+--------+--------+ Distal Stent   72                    biphasic         +---------------+--------+--------------+--------+--------+ Distal to Stent43                    biphasic         +---------------+--------+--------------+--------+--------+ Heterogenous plaque noted throughout stent without hemodynamically significant stenosis.   Summary: Moderate atherosclerosis noted throughout both extremities. Right: 30-49% stenosis noted in the superficial femoral artery. Left: 30-49% stenosis noted in the common femoral artery. Patent mid-distal SFA stent.  See table(s) above for measurements and observations. Suggest follow up study in 12 months. Electronically signed by Quay Burow MD on 04/28/2020 at 1:43:42 PM.    Final    VAS US AORTA/IVC/ILIACS  Result Date: 04/28/2020 ABDOMINAL AORTA STUDY Indications: Patient indicates he has bilateral leg pain due to his back issues.              He recently fell twice over the last 2 days on ice in his driveway.              He uses a cane and walker, unable to determine claudication              symptoms. He denies rest pain. Risk Factors: Hyperlipidemia, current smoker, prior MI, coronary artery               disease. Other Factors: SEE ABI AND  BILATERAL LEG ARTERIAL DUPLEX REPORTS. Vascular Interventions: 2011 right CIA and EIA angioplasty, right SFA                         atherectomy and left SFA stent. Limitations: Air/bowel gas.  Comparison Study: Prior aortoiliac duplex report on 04/29/2019 showed highest                   velocities in right mid CIA at 192 cm/s, right mid EIA 195                   cm/s, left proximal CIA 195 cm/s, left proximal EIA 136 cm/s. Performing Technologist: Salvadore Dom RVT, RDCS (AE), RDMS  Examination Guidelines: A complete evaluation includes B-mode imaging, spectral Doppler, color Doppler, and power Doppler as needed of all accessible portions of each vessel. Bilateral testing is considered an integral part of a complete examination. Limited examinations for reoccurring indications may be performed as noted.  Abdominal Aorta Findings: +-------------+-------+----------+----------+----------+--------+--------------+ Location     AP (cm)Trans (cm)PSV (cm/s)Waveform  ThrombusComments       +-------------+-------+----------+----------+----------+--------+--------------+ Proximal     2.30   1.90      68        biphasic                         +-------------+-------+----------+----------+----------+--------+--------------+ Distal  93        biphasic                         +-------------+-------+----------+----------+----------+--------+--------------+ RT CIA Prox                   189       biphasic                         +-------------+-------+----------+----------+----------+--------+--------------+ RT CIA Mid                    171       biphasic                         +-------------+-------+----------+----------+----------+--------+--------------+ RT CIA Distal                 193       biphasic                         +-------------+-------+----------+----------+----------+--------+--------------+ RT EIA Prox                   139       biphasic                          +-------------+-------+----------+----------+----------+--------+--------------+ RT EIA Mid                    202       monophasic        overlying                                                                bowel; > 50 %                                                            stenosis       +-------------+-------+----------+----------+----------+--------+--------------+ RT EIA Distal                 85        biphasic                         +-------------+-------+----------+----------+----------+--------+--------------+ LT CIA Prox                   188       biphasic                         +-------------+-------+----------+----------+----------+--------+--------------+ LT CIA Mid                    191       biphasic                         +-------------+-------+----------+----------+----------+--------+--------------+ LT CIA Distal  120       biphasic                         +-------------+-------+----------+----------+----------+--------+--------------+ LT EIA Prox                   182       biphasic                         +-------------+-------+----------+----------+----------+--------+--------------+ LT EIA Mid                                                overlying                                                                bowel          +-------------+-------+----------+----------+----------+--------+--------------+ LT EIA Distal                 87        biphasic                         +-------------+-------+----------+----------+----------+--------+--------------+ Visualization of the Right EIA Mid artery and Left EIA Mid artery was limited. IVC/Iliac Findings: +--------+------+--------+--------+   IVC   PatentThrombusComments +--------+------+--------+--------+ IVC Proxpatent                 +--------+------+--------+--------+    Summary: Abdominal  Aorta: No evidence of an abdominal aortic aneurysm was visualized. The largest aortic measurement is 2.3 cm. Moderate atherosclerosis noted throughout aorta. Stenosis: +--------------------+-------------+ Location            Stenosis      +--------------------+-------------+ Right External Iliac>50% stenosis +--------------------+-------------+ Moderate atherosclerosis noted throughout bilateral iliac arteries. Stable velocities from prior exam. IVC/Iliac: There is no evidence of thrombus involving the IVC.  *See table(s) above for measurements and observations. Suggest follow up study in 12 months.  Electronically signed by Quay Burow MD on 04/28/2020 at 1:44:15 PM.    Final     Assessment & Plan:    Walker Kehr, MD

## 2020-05-03 NOTE — Assessment & Plan Note (Signed)
Tramadol prn 

## 2020-05-03 NOTE — Assessment & Plan Note (Signed)
Cont w/Plavix, Lipitor, Eliquis

## 2020-05-03 NOTE — Assessment & Plan Note (Signed)
Discussed No LOC Pt declined head/neck CT

## 2020-05-03 NOTE — Assessment & Plan Note (Signed)
Cont w/Plavix, Lipitor, Eliquis 

## 2020-06-08 ENCOUNTER — Other Ambulatory Visit: Payer: Self-pay | Admitting: Internal Medicine

## 2020-06-12 NOTE — Progress Notes (Signed)
Chief Complaint  Patient presents with   Follow-up    CAD    History of Present Illness: 80 yo male with history of CAD s/p CABG 1993 and redo 2016, PAD, hyperlipidemia, atrial fibrillation/flutter, COPD, ongoing tobacco abuse, prostate cancer s/p prostatectomy/radiation who is here today for cardiac follow up. He has been followed in our office since 2011. He has had PTA of the occluded left SFA and stent placement May 2011 followed by atherectomy of his right SFA in June 2011. His PAD has remained stable since then. ABI were normal January 2022.  He is known to have moderate carotid artery disease, stable by dopplers July 2018. His CAD remained quiet from cath in 1999 until January 2016 when he was admitted with a NSTEMI and atrial fib with RVR. Cardiac cath with critical left main stenosis, LAD and Circumflex disease. Vein graft to OM occluded and LIMA to LAD to atretic. He developed cardiogenic shock with ventricular fibrillation. PTCA of left main and circumflex was performed. CT surgery did not think he was a CABG candidate. He was then readmitted with a NSTEMI in February 2016 and underwent re-do CABG (RIMA-Intermediate, S-OM, S-PDA, S-dist LAD).He had atrial fib/flutter post-op. He was placed on Eliquis. He underwent TEE guided DCCV March 2016 but converted back to atrial fib after cardioversion.   He is here today for follow up. The patient denies any chest pain, palpitations, lower extremity edema, orthopnea, PND, dizziness, near syncope or syncope. Mild dyspnea with exertion. He is still busy taking care of his wife. He reports no changes in how he is feeling over the past year.   Primary Care Physician: Cassandria Anger, MD  Past Medical History:  Diagnosis Date   ALLERGIC RHINITIS    Atrial flutter ([place])    CAD (coronary artery disease)    CAD s/p CABG 1993 and redo 2016,   Carotid artery disease ([place])    bilateral   Chronic combined systolic and diastolic CHF  (congestive heart failure) (HCC)    CKD (chronic kidney disease), stage II    COPD (chronic obstructive pulmonary disease) (HCC)    Diverticulosis    Hyperlipidemia    Osteoarthritis    PAF (paroxysmal atrial fibrillation) (HCC)    Peripheral neuropathy    Personal history of prostate cancer    PVD (peripheral vascular disease) with claudication ([place])    Radiation proctitis    STEMI (ST elevation myocardial infarction) ([place]) 04/29/2014   PTCA Lmain and CFX, in setting of VF/VT arrest and CGS   Tobacco use disorder, continuous    Ventral hernia     Past Surgical History:  Procedure Laterality Date   APPENDECTOMY     CARDIOVERSION N/A 06/24/2014   Procedure: CARDIOVERSION;  Surgeon: Pixie Casino, MD;  Location: [facility];  Service: Cardiovascular;  Laterality: N/A;   CORONARY ARTERY BYPASS GRAFT  1992   CORONARY ARTERY BYPASS GRAFT N/A 05/28/2014   Procedure: REDO CORONARY ARTERY BYPASS GRAFTING (CABG);  Surgeon: Grace Isaac, MD;  Location: [facility];  Service: Open Heart Surgery;  Laterality: N/A;  Times 4 using right internal mammary artery to Intermediate artery and endoscopically harvested left saphenous vein to LAD, OM1, and PD coronary arteries.   LEFT HEART CATHETERIZATION WITH CORONARY ANGIOGRAM N/A 04/30/2014   Procedure: LEFT HEART CATHETERIZATION WITH CORONARY ANGIOGRAM;  Surgeon: Peter M Martinique, MD; Lmain 90% (s/p PTCA), LAD 80%, CFX 100% (s/p PTCA), RCA OK, LIMA-LAD atretic, SVG-OM 100% (chronic), EF 45%, pt req defib  x 13 for VF/VT arrest, CHB w/ tem pacer, IABP and intubation   LUMBAR LAMINECTOMY  5573,2202    X2   PROSTATECTOMY  05/2008   Dr. Alinda Money and XRT   TEE WITHOUT CARDIOVERSION N/A 05/28/2014   Procedure: TRANSESOPHAGEAL ECHOCARDIOGRAM (TEE);  Surgeon: Grace Isaac, MD;  Location: [facility];  Service: Open Heart Surgery;  Laterality: N/A;   TEE WITHOUT CARDIOVERSION N/A 06/24/2014   Procedure: TRANSESOPHAGEAL ECHOCARDIOGRAM (TEE);   Surgeon: Pixie Casino, MD;  Location: [facility] ENDOSCOPY;  Service: Cardiovascular;  Laterality: N/A;   TONSILLECTOMY      Current Outpatient Medications  Medication Sig Dispense Refill   aspirin EC 81 MG tablet Take 1 tablet (81 mg total) by mouth daily. 90 tablet 3   atorvastatin (LIPITOR) 40 MG tablet TAKE 1 TABLET AT BEDTIME 90 tablet 3   ELIQUIS 5 MG TABS tablet TAKE 1 TABLET TWICE A DAY 180 tablet 3   fluocinonide-emollient (LIDEX-E) 0.05 % cream Apply 1 application topically 2 (two) times daily. 60 g 2   fluticasone (FLONASE) 50 MCG/ACT nasal spray USE 1 SPRAY IN EACH NOSTRIL DAILY AS NEEDED FOR ALLERGIES OR RHINITIS 48 g 5   folic acid (FOLVITE) 1 MG tablet TAKE 1 TABLET DAILY 90 tablet 2   furosemide (LASIX) 20 MG tablet TAKE 1 TABLET EVERY OTHER DAY 90 tablet 3   halobetasol (ULTRAVATE) 0.05 % ointment Apply topically 2 (two) times daily. 100 g 3   loratadine (CLARITIN) 10 MG tablet Take 10 mg by mouth daily.     LORazepam (ATIVAN) 0.5 MG tablet Take 0.5 mg by mouth daily as needed.     nitroGLYCERIN (NITROSTAT) 0.4 MG SL tablet Place 0.4 mg under the tongue as needed. Chest pain     pantoprazole (PROTONIX) 40 MG tablet TAKE 1 TABLET DAILY 90 tablet 3   potassium chloride (KLOR-CON) 8 MEQ tablet TAKE 1 TABLET DAILY 90 tablet 3   traMADol (ULTRAM) 50 MG tablet Take 1 tablet (50 mg total) by mouth every 6 (six) hours as needed for severe pain. Pain 120 tablet 1   zolpidem (AMBIEN) 5 MG tablet Take 1 tablet (5 mg total) by mouth at bedtime. 90 tablet 1   metoprolol succinate (TOPROL-XL) 50 MG 24 hr tablet Take 1 tablet (50 mg total) by mouth daily. Take with or immediately following a meal. 90 tablet 3   No current facility-administered medications for this visit.    Allergies  Allergen Reactions   Adhesive [Tape] Other (See Comments)    Takes skin off    Codeine Sulfate Itching and Other (See Comments)    headaches    Social History   Socioeconomic History    Marital status: Married    Spouse name: Not on file   Number of children: 3   Years of education: Not on file   Highest education level: Not on file  Occupational History   Occupation: Retired-self employed    Employer: RETIRED  Tobacco Use   Smoking status: Current Every Day Smoker    Years: 40.00    Types: Cigarettes   Smokeless tobacco: Never Used  Scientific laboratory technician Use: Never used  Substance and Sexual Activity   Alcohol use: No   Drug use: No   Sexual activity: Not Currently  Other Topics Concern   Not on file  Social History Narrative   Not on file   Social Determinants of Health   Financial Resource Strain: Not on file  Food Insecurity:  Not on file  Transportation Needs: Not on file  Physical Activity: Not on file  Stress: Not on file  Social Connections: Not on file  Intimate Partner Violence: Not on file    Family History  Problem Relation Age of Onset   Heart disease Father    Coronary artery disease Father    Lung cancer Sister    Heart disease Brother    Hypertension Unknown     Review of Systems:  As stated in the HPI and otherwise negative.   BP (!) 142/80    Pulse (!) 52    Ht 5' 11.75" (1.822 m)    Wt 169 lb 12.8 oz (77 kg)    SpO2 97%    BMI 23.19 kg/m   Physical Examination:  General: Well developed, well nourished, NAD  HEENT: OP clear, mucus membranes moist  SKIN: warm, dry. No rashes. Neuro: No focal deficits  Musculoskeletal: Muscle strength 5/5 all ext  Psychiatric: Mood and affect normal  Neck: No JVD, no carotid bruits, no thyromegaly, no lymphadenopathy.  Lungs:Clear bilaterally, no wheezes, rhonci, crackles Cardiovascular: Regular rate and rhythm. No murmurs, gallops or rubs. Abdomen:Soft. Bowel sounds present. Non-tender.  Extremities: No lower extremity edema. Pulses are 2 + in the bilateral DP/PT.  EKG:  EKG is ordered today. The ekg ordered today demonstrates Sinus bradycardia, rate 52 bpm  Recent  Labs: 02/01/2020: ALT 14; BUN 12; Creatinine, Ser 0.93; Hemoglobin 14.1; Platelets 228.0; Potassium 4.6; Sodium 137; TSH 1.96   Lipid Panel    Component Value Date/Time   CHOL 114 02/01/2020 1156   CHOL 119 10/20/2019 1331   TRIG 166.0 (H) 02/01/2020 1156   TRIG 91 04/22/2006 0854   HDL 34.80 (L) 02/01/2020 1156   HDL 33 (L) 10/20/2019 1331   CHOLHDL 3 02/01/2020 1156   VLDL 33.2 02/01/2020 1156   LDLCALC 46 02/01/2020 1156   LDLCALC 64 10/20/2019 1331     Wt Readings from Last 3 Encounters:  06/13/20 169 lb 12.8 oz (77 kg)  05/03/20 166 lb 9.6 oz (75.6 kg)  02/01/20 166 lb 12.8 oz (75.7 kg)     Other studies Reviewed: Additional studies/ records that were reviewed today include: . Review of the above records demonstrates:    Assessment and Plan:   1. CAD s/p CABG without angina: He has no chest pain. Continue ASA, statin and beta blocker  2. PAD: No pain in legs. ABI stable January 2022  3. Paroxysmal Atrial fibrillation/flutter: He is in sinus today. Will continue Eliquis and beta blocker.    4. Tobacco abuse: Despite having had two open heart procedures, severe PVD and COPD, he continues to smoke and does not wish to stop. Smoking cessation counseling provided. He does not wish to stop.   5. Carotid artery disease: Mild bilateral carotid artery disease by last dopplers in July 2021.    6. Chronic diastolic CHF: CXKG=81% by echo 2016. No volume overload on exam. Continue Lasix  Current medicines are reviewed at length with the patient today.  The patient does not have concerns regarding medicines.  The following changes have been made:  no change  Labs/ tests ordered today include:   Orders Placed This Encounter  Procedures   EKG 12-Lead    Disposition:   FU with me in 12 months  Signed, Lauree Chandler, MD 06/13/2020 10:22 AM    [facility] [address], [place], [place]  [address] Phone: [phone]; Fax: [phone]

## 2020-06-13 ENCOUNTER — Encounter: Payer: Self-pay | Admitting: Cardiovascular Disease

## 2020-06-13 ENCOUNTER — Other Ambulatory Visit: Payer: Self-pay

## 2020-06-13 ENCOUNTER — Ambulatory Visit (INDEPENDENT_AMBULATORY_CARE_PROVIDER_SITE_OTHER): Payer: Medicare Other | Admitting: Cardiovascular Disease

## 2020-06-13 VITALS — BP 142/80 | HR 52 | Ht 71.75 in | Wt 169.8 lb

## 2020-06-13 DIAGNOSIS — I5032 Chronic diastolic (congestive) heart failure: Secondary | ICD-10-CM | POA: Diagnosis not present

## 2020-06-13 DIAGNOSIS — I739 Peripheral vascular disease, unspecified: Secondary | ICD-10-CM | POA: Diagnosis not present

## 2020-06-13 DIAGNOSIS — I480 Paroxysmal atrial fibrillation: Secondary | ICD-10-CM

## 2020-06-13 DIAGNOSIS — I25810 Atherosclerosis of coronary artery bypass graft(s) without angina pectoris: Secondary | ICD-10-CM

## 2020-06-13 DIAGNOSIS — I6523 Occlusion and stenosis of bilateral carotid arteries: Secondary | ICD-10-CM | POA: Diagnosis not present

## 2020-06-13 NOTE — Patient Instructions (Signed)

## 2020-07-07 ENCOUNTER — Other Ambulatory Visit: Payer: Self-pay | Admitting: Internal Medicine

## 2020-07-25 ENCOUNTER — Other Ambulatory Visit: Payer: Self-pay | Admitting: Internal Medicine

## 2020-07-29 ENCOUNTER — Other Ambulatory Visit: Payer: Self-pay | Admitting: Internal Medicine

## 2020-08-01 ENCOUNTER — Other Ambulatory Visit: Payer: Self-pay

## 2020-08-02 ENCOUNTER — Ambulatory Visit (INDEPENDENT_AMBULATORY_CARE_PROVIDER_SITE_OTHER): Payer: Medicare Other | Admitting: Internal Medicine

## 2020-08-02 ENCOUNTER — Encounter: Payer: Self-pay | Admitting: Internal Medicine

## 2020-08-02 VITALS — BP 138/62 | HR 52 | Temp 98.4°F | Ht 71.5 in | Wt 170.4 lb

## 2020-08-02 DIAGNOSIS — I4892 Unspecified atrial flutter: Secondary | ICD-10-CM

## 2020-08-02 DIAGNOSIS — J410 Simple chronic bronchitis: Secondary | ICD-10-CM | POA: Diagnosis not present

## 2020-08-02 DIAGNOSIS — M5440 Lumbago with sciatica, unspecified side: Secondary | ICD-10-CM | POA: Diagnosis not present

## 2020-08-02 DIAGNOSIS — I6523 Occlusion and stenosis of bilateral carotid arteries: Secondary | ICD-10-CM | POA: Diagnosis not present

## 2020-08-02 DIAGNOSIS — E785 Hyperlipidemia, unspecified: Secondary | ICD-10-CM | POA: Diagnosis not present

## 2020-08-02 DIAGNOSIS — C61 Malignant neoplasm of prostate: Secondary | ICD-10-CM | POA: Diagnosis not present

## 2020-08-02 DIAGNOSIS — G8929 Other chronic pain: Secondary | ICD-10-CM

## 2020-08-02 LAB — LIPID PANEL
Cholesterol: 107 mg/dL (ref 0–200)
HDL: 36.8 mg/dL — ABNORMAL LOW (ref >39.00)
LDL Cholesterol: 39 mg/dL (ref 0–99)
NonHDL: 70.21
Total CHOL/HDL Ratio: 3
Triglycerides: 156 mg/dL — ABNORMAL HIGH (ref 0.0–149.0)
VLDL: 31.2 mg/dL (ref 0.0–40.0)

## 2020-08-02 LAB — COMPREHENSIVE METABOLIC PANEL
ALT: 11 U/L (ref 0–53)
AST: 15 U/L (ref 0–37)
Albumin: 4.2 g/dL (ref 3.5–5.2)
Alkaline Phosphatase: 69 U/L (ref 39–117)
BUN: 15 mg/dL (ref 6–23)
CO2: 30 mEq/L (ref 19–32)
Calcium: 9.7 mg/dL (ref 8.4–10.5)
Chloride: 101 mEq/L (ref 96–112)
Creatinine, Ser: 1.01 mg/dL (ref 0.40–1.50)
GFR: 69.71 mL/min (ref >60.00)
Glucose, Bld: 69 mg/dL — ABNORMAL LOW (ref 70–99)
Potassium: 4.1 mEq/L (ref 3.5–5.1)
Sodium: 138 mEq/L (ref 135–145)
Total Bilirubin: 0.5 mg/dL (ref 0.2–1.2)
Total Protein: 6.7 g/dL (ref 6.0–8.3)

## 2020-08-02 NOTE — Assessment & Plan Note (Signed)
In a smoker 

## 2020-08-02 NOTE — Assessment & Plan Note (Signed)
Monitoring PSA 

## 2020-08-02 NOTE — Progress Notes (Signed)
Subjective:  Patient ID: [name], male    DOB: [birth year]  Age: 80 y.o. MRN: [id number]  CC: Follow-up (3 month f/u)   HPI [name] presents for CAD, HTN, PAD f/u  Outpatient Medications Prior to Visit  Medication Sig Dispense Refill  . aspirin EC 81 MG tablet Take 1 tablet (81 mg total) by mouth daily. 90 tablet 3  . atorvastatin (LIPITOR) 40 MG tablet TAKE 1 TABLET AT BEDTIME 90 tablet 3  . ELIQUIS 5 MG TABS tablet TAKE 1 TABLET TWICE A DAY 180 tablet 3  . fluocinonide-emollient (LIDEX-E) 0.05 % cream Apply 1 application topically 2 (two) times daily. 60 g 2  . fluticasone (FLONASE) 50 MCG/ACT nasal spray USE 1 SPRAY IN EACH NOSTRIL DAILY AS NEEDED FOR ALLERGIES OR RHINITIS 48 g 5  . folic acid (FOLVITE) 1 MG tablet TAKE 1 TABLET DAILY 90 tablet 2  . furosemide (LASIX) 20 MG tablet TAKE 1 TABLET EVERY OTHER DAY 90 tablet 3  . halobetasol (ULTRAVATE) 0.05 % ointment Apply topically 2 (two) times daily. 100 g 3  . loratadine (CLARITIN) 10 MG tablet Take 10 mg by mouth daily.    Marland Kitchen LORazepam (ATIVAN) 0.5 MG tablet Take 0.5 mg by mouth daily as needed.    . nitroGLYCERIN (NITROSTAT) 0.4 MG SL tablet Place 0.4 mg under the tongue as needed. Chest pain    . pantoprazole (PROTONIX) 40 MG tablet TAKE 1 TABLET DAILY 90 tablet 3  . potassium chloride (KLOR-CON) 8 MEQ tablet TAKE 1 TABLET DAILY 90 tablet 1  . traMADol (ULTRAM) 50 MG tablet Take 1 tablet (50 mg total) by mouth every 6 (six) hours as needed for severe pain. Pain 120 tablet 1  . zolpidem (AMBIEN) 5 MG tablet Take 1 tablet (5 mg total) by mouth at bedtime. 90 tablet 1  . metoprolol succinate (TOPROL-XL) 50 MG 24 hr tablet Take 1 tablet (50 mg total) by mouth daily. Take with or immediately following a meal. 90 tablet 3   No facility-administered medications prior to visit.    ROS: Review of Systems  Constitutional: Negative for appetite change, fatigue and unexpected weight change.  HENT: Negative for  congestion, nosebleeds, sneezing, sore throat and trouble swallowing.   Eyes: Negative for itching and visual disturbance.  Respiratory: Positive for cough.   Cardiovascular: Negative for chest pain, palpitations and leg swelling.  Gastrointestinal: Negative for abdominal distention, blood in stool, diarrhea and nausea.  Genitourinary: Negative for frequency and hematuria.  Musculoskeletal: Positive for back pain, gait problem and neck stiffness. Negative for joint swelling and neck pain.  Skin: Negative for rash.  Neurological: Negative for dizziness, tremors, speech difficulty and weakness.  Psychiatric/Behavioral: Negative for agitation, dysphoric mood and sleep disturbance. The patient is not nervous/anxious.     Objective:  BP 138/62 (BP Location: Left Arm)   Pulse (!) 52   Temp 98.4 F (36.9 C) (Oral)   Ht 5' 11.5" (1.816 m)   Wt 170 lb 6.4 oz (77.3 kg)   SpO2 96%   BMI 23.43 kg/m   BP Readings from Last 3 Encounters:  08/02/20 138/62  06/13/20 (!) 142/80  05/03/20 122/68    Wt Readings from Last 3 Encounters:  08/02/20 170 lb 6.4 oz (77.3 kg)  06/13/20 169 lb 12.8 oz (77 kg)  05/03/20 166 lb 9.6 oz (75.6 kg)    Physical Exam Constitutional:      General: He is not in acute distress.    Appearance:  He is well-developed.     Comments: NAD  Eyes:     Conjunctiva/sclera: Conjunctivae normal.     Pupils: Pupils are equal, round, and reactive to light.  Neck:     Thyroid: No thyromegaly.     Vascular: No JVD.  Cardiovascular:     Rate and Rhythm: Normal rate. Rhythm irregular.     Heart sounds: Normal heart sounds. No murmur heard. No friction rub. No gallop.   Pulmonary:     Effort: Pulmonary effort is normal. No respiratory distress.     Breath sounds: Normal breath sounds. No wheezing or rales.  Chest:     Chest wall: No tenderness.  Abdominal:     General: Bowel sounds are normal. There is no distension.     Palpations: Abdomen is soft. There is no mass.      Tenderness: There is no abdominal tenderness. There is no guarding or rebound.  Musculoskeletal:        General: Tenderness present. Normal range of motion.     Cervical back: Normal range of motion.  Lymphadenopathy:     Cervical: No cervical adenopathy.  Skin:    General: Skin is warm and dry.     Findings: No rash.  Neurological:     Mental Status: He is alert and oriented to person, place, and time.     Cranial Nerves: No cranial nerve deficit.     Motor: No abnormal muscle tone.     Coordination: Coordination abnormal.     Gait: Gait normal.     Deep Tendon Reflexes: Reflexes are normal and symmetric.  Psychiatric:        Behavior: Behavior normal.        Thought Content: Thought content normal.        Judgment: Judgment normal.    Using a cane, walker   Lab Results  Component Value Date   WBC 8.6 02/01/2020   HGB 14.1 02/01/2020   HCT 41.1 02/01/2020   PLT 228.0 02/01/2020   GLUCOSE 80 02/01/2020   CHOL 114 02/01/2020   TRIG 166.0 (H) 02/01/2020   HDL 34.80 (L) 02/01/2020   LDLCALC 46 02/01/2020   ALT 14 02/01/2020   AST 17 02/01/2020   NA 137 02/01/2020   K 4.6 02/01/2020   CL 100 02/01/2020   CREATININE 0.93 02/01/2020   BUN 12 02/01/2020   CO2 32 02/01/2020   TSH 1.96 02/01/2020   PSA 0.00 (L) 02/01/2020   INR 1.30 05/28/2014   HGBA1C 5.7 07/11/2006    VAS [place] ABI WITH/WO TBI  Result Date: 04/28/2020 LOWER EXTREMITY DOPPLER STUDY Indications: Peripheral artery disease, and Patient indicates he has bilateral              leg pain due to his back issues. He recently fell twice over the              last 2 days on ice in his driveway. He uses a cane and walker,              unable to determine claudication symptoms. He denies rest pain. High Risk Factors: Hyperlipidemia, current smoker, prior MI, coronary artery                    disease. Other Factors: SEE AORTOILIAC AND BILATERAL LEG ARTERIAL DUPLEX REPORTS.  Vascular Interventions: 2011 right CIA and  EIA angioplasty, right SFA  atherectomy and left SFA stent. Comparison Study: Prior ABI 04/29/2019 Right 1.03 Left 1.01 Performing Technologist: Salvadore Dom RVT  Examination Guidelines: A complete evaluation includes at minimum, Doppler waveform signals and systolic blood pressure reading at the level of bilateral brachial, anterior tibial, and posterior tibial arteries, when vessel segments are accessible. Bilateral testing is considered an integral part of a complete examination. Photoelectric Plethysmograph (PPG) waveforms and toe systolic pressure readings are included as required and additional duplex testing as needed. Limited examinations for reoccurring indications may be performed as noted.  ABI Findings: +---------+------------------+-----+---------+--------+ Right    Rt Pressure (mmHg)IndexWaveform Comment  +---------+------------------+-----+---------+--------+ Brachial 148                                      +---------+------------------+-----+---------+--------+ ATA      124               0.84 biphasic          +---------+------------------+-----+---------+--------+ PTA      144               0.97 triphasic         +---------+------------------+-----+---------+--------+ PERO     132               0.89 triphasic         +---------+------------------+-----+---------+--------+ Great Toe86                0.58 Abnormal          +---------+------------------+-----+---------+--------+ +---------+------------------+-----+----------+-------+ Left     Lt Pressure (mmHg)IndexWaveform  Comment +---------+------------------+-----+----------+-------+ Brachial 139                                      +---------+------------------+-----+----------+-------+ ATA      111               0.75 monophasic        +---------+------------------+-----+----------+-------+ PTA      143               0.97 triphasic          +---------+------------------+-----+----------+-------+ PERO     114               0.77 biphasic          +---------+------------------+-----+----------+-------+ Heloise Ochoa                0.59 Abnormal          +---------+------------------+-----+----------+-------+ +-------+-----------+-----------+------------+------------+ ABI/TBIToday's ABIToday's TBIPrevious ABIPrevious TBI +-------+-----------+-----------+------------+------------+ Right  .97        .58        1.03        .66          +-------+-----------+-----------+------------+------------+ Left   .97        .59        1.01        .61          +-------+-----------+-----------+------------+------------+ Bilateral ABIs and TBIs appear essentially unchanged compared to prior study on 04/29/2019.  Summary: Right: The right toe-brachial index is abnormal. Although ankle brachial indices are within normal limits (0.95-1.29), arterial Doppler waveforms at the ankle suggest some component of arterial occlusive disease. Left: The left toe-brachial index is abnormal. Although ankle brachial indices are within normal limits (0.95-1.29), arterial Doppler waveforms at the ankle suggest some component  of arterial occlusive disease.  *See table(s) above for measurements and observations.  Suggest follow up study in 12 months. Electronically signed by Quay Burow MD on 04/28/2020 at 1:45:07 PM.    Final    VAS [place] LOWER EXTREMITY ARTERIAL DUPLEX  Result Date: 04/28/2020 LOWER EXTREMITY ARTERIAL DUPLEX STUDY Indications: Peripheral artery disease, and Patient indicates he has bilateral              leg pain due to his back issues. He recently fell twice over the              last 2 days on ice in his driveway. He uses a cane and walker,              unable to determine claudication symptoms. He denies rest pain. High Risk         Hyperlipidemia, current smoker, prior MI, coronary artery Factors:          disease. Other Factors: SEE ABI AND  AORTOILIAC DUPLEX REPORTS.  Vascular Interventions: 2011 right CIA and EIA angioplasty, right SFA                         atherectomy and left SFA stent. Current ABI:            Right .97 Left .27 Comparison Study: Prior bilateral arterial duplex exam on 04/29/2019 showed                   highest velocities in right distal SFA at 220 cm/s. Left mid                   SFA stent at 123 cm/s. Performing Technologist: Salvadore Dom RVT, RDCS (AE), RDMS  Examination Guidelines: A complete evaluation includes B-mode imaging, spectral Doppler, color Doppler, and power Doppler as needed of all accessible portions of each vessel. Bilateral testing is considered an integral part of a complete examination. Limited examinations for reoccurring indications may be performed as noted. Aorta: +--------+-------+----------+----------+--------+--------+-----+         AP (cm)Trans (cm)PSV (cm/s)WaveformThrombusShape +--------+-------+----------+----------+--------+--------+-----+ Proximal                 68                              +--------+-------+----------+----------+--------+--------+-----+ Distal                   93                              +--------+-------+----------+----------+--------+--------+-----+   +----------+--------+-----+---------------+--------+---------+ RIGHT     PSV cm/sRatioStenosis       WaveformComments  +----------+--------+-----+---------------+--------+---------+ CIA Prox  189                                           +----------+--------+-----+---------------+--------+---------+ CIA Mid   171                                           +----------+--------+-----+---------------+--------+---------+ CIA [id number]                                           +----------+--------+-----+---------------+--------+---------+  EIA Prox  139                                           +----------+--------+-----+---------------+--------+---------+ EIA Mid    202                                           +----------+--------+-----+---------------+--------+---------+ EIA Distal85                                            +----------+--------+-----+---------------+--------+---------+ CFA Prox  130                         biphasic          +----------+--------+-----+---------------+--------+---------+ CFA Distal128                         biphasic          +----------+--------+-----+---------------+--------+---------+ DFA       140                         biphasic          +----------+--------+-----+---------------+--------+---------+ SFA Prox  212          30-49% stenosisbiphasic          +----------+--------+-----+---------------+--------+---------+ SFA Mid   207          30-49% stenosisbiphasicshadowing +----------+--------+-----+---------------+--------+---------+ SFA Distal112                         biphasic          +----------+--------+-----+---------------+--------+---------+ POP Prox  80                          biphasic          +----------+--------+-----+---------------+--------+---------+ POP Distal41                          biphasic          +----------+--------+-----+---------------+--------+---------+ TP Trunk  45                          biphasic          +----------+--------+-----+---------------+--------+---------+ A focal velocity elevation of 212 cm/s was obtained at PRX SFA with a VR of 1.7. Findings are characteristic of 30-49% stenosis. A 2nd focal velocity elevation was visualized, measuring 207 cm/s at MID SFA with a VR of 2.1. Findings are characteristic of  30-49% stenosis. There are areas of shadowing which a higher grade stenosis or occlusion can not be ruled out.  +----------+--------+-----+---------------+--------+-----------------+ LEFT      PSV cm/sRatioStenosis       WaveformComments           +----------+--------+-----+---------------+--------+-----------------+ CIA Prox  188                                                   +----------+--------+-----+---------------+--------+-----------------+  CIA Mid   191                                                   +----------+--------+-----+---------------+--------+-----------------+ CIA Distal120                                                   +----------+--------+-----+---------------+--------+-----------------+ EIA Prox  182                                                   +----------+--------+-----+---------------+--------+-----------------+ EIA Distal87                                                    +----------+--------+-----+---------------+--------+-----------------+ CFA Prox  120                         biphasic                  +----------+--------+-----+---------------+--------+-----------------+ CFA Distal192          30-49% stenosisbiphasic                  +----------+--------+-----+---------------+--------+-----------------+ DFA       78                          biphasicatypical waveform +----------+--------+-----+---------------+--------+-----------------+ SFA Prox  131                         biphasic                  +----------+--------+-----+---------------+--------+-----------------+ SFA Mid   90                          biphasic                  +----------+--------+-----+---------------+--------+-----------------+ POP Prox  58                          biphasic                  +----------+--------+-----+---------------+--------+-----------------+ POP Distal37                          biphasic                  +----------+--------+-----+---------------+--------+-----------------+ TP Trunk  40                          biphasic                  +----------+--------+-----+---------------+--------+-----------------+ A focal velocity elevation of  192 cm/s was obtained at DST CFA with a VR of 1.6.  Left Stent(s): +---------------+--------+--------------+--------+--------+ MID-DST SFA    PSV cm/sStenosis  WaveformComments +---------------+--------+--------------+--------+--------+ Prox to Stent  122                   biphasic         +---------------+--------+--------------+--------+--------+ Proximal Stent 133     1-49% stenosisbiphasic         +---------------+--------+--------------+--------+--------+ Mid Stent      115                   biphasic         +---------------+--------+--------------+--------+--------+ Distal Stent   72                    biphasic         +---------------+--------+--------------+--------+--------+ Distal to Stent43                    biphasic         +---------------+--------+--------------+--------+--------+ Heterogenous plaque noted throughout stent without hemodynamically significant stenosis.   Summary: Moderate atherosclerosis noted throughout both extremities. Right: 30-49% stenosis noted in the superficial femoral artery. Left: 30-49% stenosis noted in the common femoral artery. Patent mid-distal SFA stent.  See table(s) above for measurements and observations. Suggest follow up study in 12 months. Electronically signed by Quay Burow MD on 04/28/2020 at 1:43:42 PM.    Final    VAS US AORTA/IVC/ILIACS  Result Date: 04/28/2020 ABDOMINAL AORTA STUDY Indications: Patient indicates he has bilateral leg pain due to his back issues.              He recently fell twice over the last 2 days on ice in his driveway.              He uses a cane and walker, unable to determine claudication              symptoms. He denies rest pain. Risk Factors: Hyperlipidemia, current smoker, prior MI, coronary artery               disease. Other Factors: SEE ABI AND BILATERAL LEG ARTERIAL DUPLEX REPORTS. Vascular Interventions: 2011 right CIA and EIA angioplasty, right SFA                          atherectomy and left SFA stent. Limitations: Air/bowel gas.  Comparison Study: Prior aortoiliac duplex report on 04/29/2019 showed highest                   velocities in right mid CIA at 192 cm/s, right mid EIA 195                   cm/s, left proximal CIA 195 cm/s, left proximal EIA 136 cm/s. Performing Technologist: Salvadore Dom RVT, RDCS (AE), RDMS  Examination Guidelines: A complete evaluation includes B-mode imaging, spectral Doppler, color Doppler, and power Doppler as needed of all accessible portions of each vessel. Bilateral testing is considered an integral part of a complete examination. Limited examinations for reoccurring indications may be performed as noted.  Abdominal Aorta Findings: +-------------+-------+----------+----------+----------+--------+--------------+ Location     AP (cm)Trans (cm)PSV (cm/s)Waveform  ThrombusComments       +-------------+-------+----------+----------+----------+--------+--------------+ Proximal     2.30   1.90      68        biphasic                         +-------------+-------+----------+----------+----------+--------+--------------+ Distal  93        biphasic                         +-------------+-------+----------+----------+----------+--------+--------------+ RT CIA Prox                   189       biphasic                         +-------------+-------+----------+----------+----------+--------+--------------+ RT CIA Mid                    171       biphasic                         +-------------+-------+----------+----------+----------+--------+--------------+ RT CIA Distal                 193       biphasic                         +-------------+-------+----------+----------+----------+--------+--------------+ RT EIA Prox                   139       biphasic                         +-------------+-------+----------+----------+----------+--------+--------------+ RT EIA Mid                     202       monophasic        overlying                                                                bowel; > 50 %                                                            stenosis       +-------------+-------+----------+----------+----------+--------+--------------+ RT EIA Distal                 85        biphasic                         +-------------+-------+----------+----------+----------+--------+--------------+ LT CIA Prox                   188       biphasic                         +-------------+-------+----------+----------+----------+--------+--------------+ LT CIA Mid                    191       biphasic                         +-------------+-------+----------+----------+----------+--------+--------------+ LT CIA Distal  120       biphasic                         +-------------+-------+----------+----------+----------+--------+--------------+ LT EIA Prox                   182       biphasic                         +-------------+-------+----------+----------+----------+--------+--------------+ LT EIA Mid                                                overlying                                                                bowel          +-------------+-------+----------+----------+----------+--------+--------------+ LT EIA Distal                 87        biphasic                         +-------------+-------+----------+----------+----------+--------+--------------+ Visualization of the Right EIA Mid artery and Left EIA Mid artery was limited. IVC/Iliac Findings: +--------+------+--------+--------+   IVC   PatentThrombusComments +--------+------+--------+--------+ IVC Proxpatent                 +--------+------+--------+--------+    Summary: Abdominal Aorta: No evidence of an abdominal aortic aneurysm was visualized. The largest aortic measurement is 2.3 cm. Moderate atherosclerosis  noted throughout aorta. Stenosis: +--------------------+-------------+ Location            Stenosis      +--------------------+-------------+ Right External Iliac>50% stenosis +--------------------+-------------+ Moderate atherosclerosis noted throughout bilateral iliac arteries. Stable velocities from prior exam. IVC/Iliac: There is no evidence of thrombus involving the IVC.  *See table(s) above for measurements and observations. Suggest follow up study in 12 months.  Electronically signed by Quay Burow MD on 04/28/2020 at 1:44:15 PM.    Final     Assessment & Plan:    Walker Kehr, MD

## 2020-08-02 NOTE — Assessment & Plan Note (Signed)
[facility]  Potential benefits of a long term opioids use as well as potential risks (i.e. addiction risk, apnea etc) and complications (i.e. Somnolence, constipation and others) were explained to the patient and were aknowledged. 

## 2020-08-02 NOTE — Assessment & Plan Note (Addendum)
On Eliquis

## 2020-08-03 ENCOUNTER — Ambulatory Visit: Payer: Medicare Other | Attending: Internal Medicine

## 2020-08-03 DIAGNOSIS — Z23 Encounter for immunization: Secondary | ICD-10-CM

## 2020-08-03 NOTE — Progress Notes (Signed)
   Covid-19 Vaccination Clinic  Name:  TYRELL SEIFER    MRN: [id number] DOB: 1940/12/11  08/03/2020  Mr. Lant was observed post Covid-19 immunization for 15 minutes without incident. He was provided with Vaccine Information Sheet and instruction to access the V-Safe system.   Mr. Siegel was instructed to call 911 with any severe reactions post vaccine: Marland Kitchen Difficulty breathing  . Swelling of face and throat  . A fast heartbeat  . A bad rash all over body  . Dizziness and weakness   Immunizations Administered    Name Date Dose VIS Date Route   PFIZER Comrnaty(Gray TOP) Covid-19 Vaccine 08/03/2020 10:43 AM 0.3 mL 03/17/2020 Intramuscular   Manufacturer: [employer], [employer]   Lot: [id number]   NDC: [phone]

## 2020-08-08 ENCOUNTER — Other Ambulatory Visit (HOSPITAL_COMMUNITY): Payer: Self-pay

## 2020-08-08 MED ORDER — COVID-19 MRNA VAC-TRIS(PFIZER) 30 MCG/0.3ML IM SUSP
INTRAMUSCULAR | 0 refills | Status: DC
  Filled 2020-08-08: qty 0.3, 17d supply, fill #0

## 2020-08-09 ENCOUNTER — Other Ambulatory Visit: Payer: Self-pay | Admitting: Internal Medicine

## 2020-08-15 ENCOUNTER — Other Ambulatory Visit: Payer: Self-pay | Admitting: Internal Medicine

## 2020-09-26 ENCOUNTER — Other Ambulatory Visit: Payer: Self-pay | Admitting: Physician Assistant

## 2020-11-15 DIAGNOSIS — Z961 Presence of intraocular lens: Secondary | ICD-10-CM | POA: Diagnosis not present

## 2020-11-15 DIAGNOSIS — H2511 Age-related nuclear cataract, right eye: Secondary | ICD-10-CM | POA: Diagnosis not present

## 2020-11-15 DIAGNOSIS — H35033 Hypertensive retinopathy, bilateral: Secondary | ICD-10-CM | POA: Diagnosis not present

## 2020-11-15 DIAGNOSIS — H33102 Unspecified retinoschisis, left eye: Secondary | ICD-10-CM | POA: Diagnosis not present

## 2020-11-15 DIAGNOSIS — H35362 Drusen (degenerative) of macula, left eye: Secondary | ICD-10-CM | POA: Diagnosis not present

## 2020-11-15 DIAGNOSIS — H35371 Puckering of macula, right eye: Secondary | ICD-10-CM | POA: Diagnosis not present

## 2020-11-30 ENCOUNTER — Ambulatory Visit: Payer: Medicare Other | Admitting: Internal Medicine

## 2020-11-30 ENCOUNTER — Other Ambulatory Visit: Payer: Self-pay

## 2020-11-30 ENCOUNTER — Encounter: Payer: Self-pay | Admitting: Internal Medicine

## 2020-11-30 ENCOUNTER — Ambulatory Visit (INDEPENDENT_AMBULATORY_CARE_PROVIDER_SITE_OTHER): Payer: Medicare Other | Admitting: Internal Medicine

## 2020-11-30 VITALS — BP 142/70 | HR 54 | Temp 98.4°F | Ht 71.5 in | Wt 172.4 lb

## 2020-11-30 DIAGNOSIS — G8929 Other chronic pain: Secondary | ICD-10-CM | POA: Diagnosis not present

## 2020-11-30 DIAGNOSIS — I4892 Unspecified atrial flutter: Secondary | ICD-10-CM

## 2020-11-30 DIAGNOSIS — J410 Simple chronic bronchitis: Secondary | ICD-10-CM

## 2020-11-30 DIAGNOSIS — C61 Malignant neoplasm of prostate: Secondary | ICD-10-CM | POA: Diagnosis not present

## 2020-11-30 DIAGNOSIS — M5440 Lumbago with sciatica, unspecified side: Secondary | ICD-10-CM | POA: Diagnosis not present

## 2020-11-30 DIAGNOSIS — Z23 Encounter for immunization: Secondary | ICD-10-CM

## 2020-11-30 DIAGNOSIS — I6523 Occlusion and stenosis of bilateral carotid arteries: Secondary | ICD-10-CM

## 2020-11-30 NOTE — Assessment & Plan Note (Signed)
F/u w/Urology q 12 months

## 2020-11-30 NOTE — Addendum Note (Signed)
Addended by: Earnstine Regal on: 11/30/2020 11:03 AM   Modules accepted: Orders

## 2020-11-30 NOTE — Assessment & Plan Note (Signed)
Doing well overall On Eliquis, off Amiodarone

## 2020-11-30 NOTE — Assessment & Plan Note (Signed)
Using a walker/cane Tylenol prn Tramadol [facility]  Potential benefits of a long term opioids use as well as potential risks (i.e. addiction risk, apnea etc) and complications (i.e. Somnolence, constipation and others) were explained to the patient and were aknowledged. 

## 2020-11-30 NOTE — Assessment & Plan Note (Signed)
Doing fair, stable

## 2020-11-30 NOTE — Progress Notes (Signed)
Subjective:  Patient ID: [name], male    DOB: Dec 19, 1940  Age: 80 y.o. MRN: [id number]  CC: No chief complaint on file.   HPI [name] presents for LBP, HTN, CAD  Outpatient Medications Prior to Visit  Medication Sig Dispense Refill   aspirin EC 81 MG tablet Take 1 tablet (81 mg total) by mouth daily. 90 tablet 3   atorvastatin (LIPITOR) 40 MG tablet TAKE 1 TABLET AT BEDTIME 90 tablet 3   ELIQUIS 5 MG TABS tablet TAKE 1 TABLET TWICE A DAY 180 tablet 3   fluticasone (FLONASE) 50 MCG/ACT nasal spray USE 1 SPRAY IN EACH NOSTRIL DAILY AS NEEDED FOR ALLERGIES OR RHINITIS 48 g 5   folic acid (FOLVITE) 1 MG tablet TAKE 1 TABLET DAILY 90 tablet 2   furosemide (LASIX) 20 MG tablet TAKE 1 TABLET EVERY OTHER DAY 90 tablet 3   loratadine (CLARITIN) 10 MG tablet Take 10 mg by mouth daily.     LORazepam (ATIVAN) 0.5 MG tablet Take 0.5 mg by mouth daily as needed.     metoprolol succinate (TOPROL-XL) 50 MG 24 hr tablet Take 1 tablet (50 mg total) by mouth daily. Take with or immediately following a meal 90 tablet 2   nitroGLYCERIN (NITROSTAT) 0.4 MG SL tablet Place 0.4 mg under the tongue as needed. Chest pain     pantoprazole (PROTONIX) 40 MG tablet TAKE 1 TABLET DAILY 90 tablet 3   potassium chloride (KLOR-CON) 8 MEQ tablet TAKE 1 TABLET DAILY 90 tablet 1   traMADol (ULTRAM) 50 MG tablet Take 1 tablet (50 mg total) by mouth every 6 (six) hours as needed for severe pain. Pain 120 tablet 1   zolpidem (AMBIEN) 5 MG tablet Take 1 tablet (5 mg total) by mouth at bedtime. 90 tablet 1   COVID-19 mRNA Vac-TriS, Pfizer, SUSP injection Inject into the muscle. (Patient not taking: Reported on 11/30/2020) 0.3 mL 0   fluocinonide-emollient (LIDEX-E) 0.05 % cream Apply 1 application topically 2 (two) times daily. (Patient not taking: Reported on 11/30/2020) 60 g 2   halobetasol (ULTRAVATE) 0.05 % ointment Apply topically 2 (two) times daily. (Patient not taking: Reported on 11/30/2020) 100 g  3   No facility-administered medications prior to visit.    ROS: Review of Systems  Constitutional:  Positive for fatigue. Negative for appetite change and unexpected weight change.  HENT:  Negative for congestion, nosebleeds, sneezing, sore throat and trouble swallowing.   Eyes:  Negative for itching and visual disturbance.  Respiratory:  Positive for shortness of breath. Negative for cough.   Cardiovascular:  Negative for chest pain, palpitations and leg swelling.  Gastrointestinal:  Negative for abdominal distention, blood in stool, diarrhea and nausea.  Genitourinary:  Negative for frequency and hematuria.  Musculoskeletal:  Positive for arthralgias, back pain, gait problem, neck pain and neck stiffness. Negative for joint swelling.  Skin:  Negative for rash.  Neurological:  Negative for dizziness, tremors, speech difficulty and weakness.  Psychiatric/Behavioral:  Negative for agitation, dysphoric mood and sleep disturbance. The patient is not nervous/anxious.    Objective:  BP (!) 142/70 (BP Location: Left Arm)   Pulse (!) 54   Temp 98.4 F (36.9 C) (Oral)   Ht 5' 11.5" (1.816 m)   Wt 172 lb 6.4 oz (78.2 kg)   SpO2 97%   BMI 23.71 kg/m   BP Readings from Last 3 Encounters:  11/30/20 (!) 142/70  08/02/20 138/62  06/13/20 (!) 142/80  Wt Readings from Last 3 Encounters:  11/30/20 172 lb 6.4 oz (78.2 kg)  08/02/20 170 lb 6.4 oz (77.3 kg)  06/13/20 169 lb 12.8 oz (77 kg)    Physical Exam Constitutional:      General: He is not in acute distress.    Appearance: He is well-developed.     Comments: NAD  Eyes:     Conjunctiva/sclera: Conjunctivae normal.     Pupils: Pupils are equal, round, and reactive to light.  Neck:     Thyroid: No thyromegaly.     Vascular: No JVD.  Cardiovascular:     Rate and Rhythm: Normal rate and regular rhythm.     Heart sounds: Normal heart sounds. No murmur heard.   No friction rub.  Pulmonary:     Effort: Pulmonary effort is  normal. No respiratory distress.     Breath sounds: No wheezing, rhonchi or rales.  Chest:     Chest wall: No tenderness.  Abdominal:     General: Bowel sounds are normal. There is no distension.     Palpations: Abdomen is soft. There is no mass.     Tenderness: There is abdominal tenderness. There is no guarding or rebound.  Musculoskeletal:        General: Tenderness present. Normal range of motion.     Cervical back: Normal range of motion.     Right lower leg: No edema.     Left lower leg: No edema.  Lymphadenopathy:     Cervical: No cervical adenopathy.  Skin:    General: Skin is warm and dry.     Findings: No rash.  Neurological:     Mental Status: He is alert and oriented to person, place, and time.     Cranial Nerves: No cranial nerve deficit.     Motor: Weakness present. No abnormal muscle tone.     Coordination: Coordination abnormal.     Gait: Gait abnormal.     Deep Tendon Reflexes: Reflexes are normal and symmetric.  Psychiatric:        Behavior: Behavior normal.        Thought Content: Thought content normal.        Judgment: Judgment normal.  Using a cane LS w/pain  Lab Results  Component Value Date   WBC 8.6 02/01/2020   HGB 14.1 02/01/2020   HCT 41.1 02/01/2020   PLT 228.0 02/01/2020   GLUCOSE 69 (L) 08/02/2020   CHOL 107 08/02/2020   TRIG 156.0 (H) 08/02/2020   HDL 36.80 (L) 08/02/2020   LDLCALC 39 08/02/2020   ALT 11 08/02/2020   AST 15 08/02/2020   NA 138 08/02/2020   K 4.1 08/02/2020   CL 101 08/02/2020   CREATININE 1.01 08/02/2020   BUN 15 08/02/2020   CO2 30 08/02/2020   TSH 1.96 02/01/2020   PSA 0.00 (L) 02/01/2020   INR 1.30 05/28/2014   HGBA1C 5.7 07/11/2006    VAS [place] ABI WITH/WO TBI  Result Date: 04/28/2020 LOWER EXTREMITY DOPPLER STUDY Indications: Peripheral artery disease, and Patient indicates he has bilateral              leg pain due to his back issues. He recently fell twice over the              last 2 days on ice in his  driveway. He uses a cane and walker,              unable to determine claudication symptoms. He  denies rest pain. High Risk Factors: Hyperlipidemia, current smoker, prior MI, coronary artery                    disease. Other Factors: SEE AORTOILIAC AND BILATERAL LEG ARTERIAL DUPLEX REPORTS.  Vascular Interventions: 2011 right CIA and EIA angioplasty, right SFA                         atherectomy and left SFA stent. Comparison Study: Prior ABI 04/29/2019 Right 1.03 Left 1.01 Performing Technologist: Salvadore Dom RVT  Examination Guidelines: A complete evaluation includes at minimum, Doppler waveform signals and systolic blood pressure reading at the level of bilateral brachial, anterior tibial, and posterior tibial arteries, when vessel segments are accessible. Bilateral testing is considered an integral part of a complete examination. Photoelectric Plethysmograph (PPG) waveforms and toe systolic pressure readings are included as required and additional duplex testing as needed. Limited examinations for reoccurring indications may be performed as noted.  ABI Findings: +---------+------------------+-----+---------+--------+ Right    Rt Pressure (mmHg)IndexWaveform Comment  +---------+------------------+-----+---------+--------+ Brachial 148                                      +---------+------------------+-----+---------+--------+ ATA      124               0.84 biphasic          +---------+------------------+-----+---------+--------+ PTA      144               0.97 triphasic         +---------+------------------+-----+---------+--------+ PERO     132               0.89 triphasic         +---------+------------------+-----+---------+--------+ Great Toe86                0.58 Abnormal          +---------+------------------+-----+---------+--------+ +---------+------------------+-----+----------+-------+ Left     Lt Pressure (mmHg)IndexWaveform  Comment  +---------+------------------+-----+----------+-------+ Brachial 139                                      +---------+------------------+-----+----------+-------+ ATA      111               0.75 monophasic        +---------+------------------+-----+----------+-------+ PTA      143               0.97 triphasic         +---------+------------------+-----+----------+-------+ PERO     114               0.77 biphasic          +---------+------------------+-----+----------+-------+ Heloise Ochoa                0.59 Abnormal          +---------+------------------+-----+----------+-------+ +-------+-----------+-----------+------------+------------+ ABI/TBIToday's ABIToday's TBIPrevious ABIPrevious TBI +-------+-----------+-----------+------------+------------+ Right  .97        .58        1.03        .66          +-------+-----------+-----------+------------+------------+ Left   .97        .59        1.01        .61          +-------+-----------+-----------+------------+------------+  Bilateral ABIs and TBIs appear essentially unchanged compared to prior study on 04/29/2019.  Summary: Right: The right toe-brachial index is abnormal. Although ankle brachial indices are within normal limits (0.95-1.29), arterial Doppler waveforms at the ankle suggest some component of arterial occlusive disease. Left: The left toe-brachial index is abnormal. Although ankle brachial indices are within normal limits (0.95-1.29), arterial Doppler waveforms at the ankle suggest some component of arterial occlusive disease.  *See table(s) above for measurements and observations.  Suggest follow up study in 12 months. Electronically signed by Quay Burow MD on 04/28/2020 at 1:45:07 PM.    Final    VAS [place] LOWER EXTREMITY ARTERIAL DUPLEX  Result Date: 04/28/2020 LOWER EXTREMITY ARTERIAL DUPLEX STUDY Indications: Peripheral artery disease, and Patient indicates he has bilateral              leg pain  due to his back issues. He recently fell twice over the              last 2 days on ice in his driveway. He uses a cane and walker,              unable to determine claudication symptoms. He denies rest pain. High Risk         Hyperlipidemia, current smoker, prior MI, coronary artery Factors:          disease. Other Factors: SEE ABI AND AORTOILIAC DUPLEX REPORTS.  Vascular Interventions: 2011 right CIA and EIA angioplasty, right SFA                         atherectomy and left SFA stent. Current ABI:            Right .97 Left .8 Comparison Study: Prior bilateral arterial duplex exam on 04/29/2019 showed                   highest velocities in right distal SFA at 220 cm/s. Left mid                   SFA stent at 123 cm/s. Performing Technologist: Salvadore Dom RVT, RDCS (AE), RDMS  Examination Guidelines: A complete evaluation includes B-mode imaging, spectral Doppler, color Doppler, and power Doppler as needed of all accessible portions of each vessel. Bilateral testing is considered an integral part of a complete examination. Limited examinations for reoccurring indications may be performed as noted. Aorta: +--------+-------+----------+----------+--------+--------+-----+         AP (cm)Trans (cm)PSV (cm/s)WaveformThrombusShape +--------+-------+----------+----------+--------+--------+-----+ Proximal                 68                              +--------+-------+----------+----------+--------+--------+-----+ Distal                   93                              +--------+-------+----------+----------+--------+--------+-----+   +----------+--------+-----+---------------+--------+---------+ RIGHT     PSV cm/sRatioStenosis       WaveformComments  +----------+--------+-----+---------------+--------+---------+ CIA Prox  189                                           +----------+--------+-----+---------------+--------+---------+ CIA Mid  171                                            +----------+--------+-----+---------------+--------+---------+ CIA Distal193                                           +----------+--------+-----+---------------+--------+---------+ EIA Prox  139                                           +----------+--------+-----+---------------+--------+---------+ EIA Mid   202                                           +----------+--------+-----+---------------+--------+---------+ EIA Distal85                                            +----------+--------+-----+---------------+--------+---------+ CFA Prox  130                         biphasic          +----------+--------+-----+---------------+--------+---------+ CFA Distal128                         biphasic          +----------+--------+-----+---------------+--------+---------+ DFA       140                         biphasic          +----------+--------+-----+---------------+--------+---------+ SFA Prox  212          30-49% stenosisbiphasic          +----------+--------+-----+---------------+--------+---------+ SFA Mid   207          30-49% stenosisbiphasicshadowing +----------+--------+-----+---------------+--------+---------+ SFA Distal112                         biphasic          +----------+--------+-----+---------------+--------+---------+ POP Prox  80                          biphasic          +----------+--------+-----+---------------+--------+---------+ POP Distal41                          biphasic          +----------+--------+-----+---------------+--------+---------+ TP Trunk  45                          biphasic          +----------+--------+-----+---------------+--------+---------+ A focal velocity elevation of 212 cm/s was obtained at PRX SFA with a VR of 1.7. Findings are characteristic of 30-49% stenosis. A 2nd focal velocity elevation was visualized, measuring 207 cm/s at MID SFA with a VR of 2.1. Findings are  characteristic of  30-49% stenosis. There are areas of  shadowing which a higher grade stenosis or occlusion can not be ruled out.  +----------+--------+-----+---------------+--------+-----------------+ LEFT      PSV cm/sRatioStenosis       WaveformComments          +----------+--------+-----+---------------+--------+-----------------+ CIA Prox  188                                                   +----------+--------+-----+---------------+--------+-----------------+ CIA Mid   191                                                   +----------+--------+-----+---------------+--------+-----------------+ CIA Distal120                                                   +----------+--------+-----+---------------+--------+-----------------+ EIA Prox  182                                                   +----------+--------+-----+---------------+--------+-----------------+ EIA Distal87                                                    +----------+--------+-----+---------------+--------+-----------------+ CFA Prox  120                         biphasic                  +----------+--------+-----+---------------+--------+-----------------+ CFA Distal192          30-49% stenosisbiphasic                  +----------+--------+-----+---------------+--------+-----------------+ DFA       78                          biphasicatypical waveform +----------+--------+-----+---------------+--------+-----------------+ SFA Prox  131                         biphasic                  +----------+--------+-----+---------------+--------+-----------------+ SFA Mid   90                          biphasic                  +----------+--------+-----+---------------+--------+-----------------+ POP Prox  58                          biphasic                  +----------+--------+-----+---------------+--------+-----------------+ POP Distal37                           biphasic                  +----------+--------+-----+---------------+--------+-----------------+  TP Trunk  40                          biphasic                  +----------+--------+-----+---------------+--------+-----------------+ A focal velocity elevation of 192 cm/s was obtained at DST CFA with a VR of 1.6.  Left Stent(s): +---------------+--------+--------------+--------+--------+ MID-DST SFA    PSV cm/sStenosis      WaveformComments +---------------+--------+--------------+--------+--------+ Prox to Stent  122                   biphasic         +---------------+--------+--------------+--------+--------+ Proximal Stent 133     1-49% stenosisbiphasic         +---------------+--------+--------------+--------+--------+ Mid Stent      115                   biphasic         +---------------+--------+--------------+--------+--------+ Distal Stent   72                    biphasic         +---------------+--------+--------------+--------+--------+ Distal to Stent43                    biphasic         +---------------+--------+--------------+--------+--------+ Heterogenous plaque noted throughout stent without hemodynamically significant stenosis.   Summary: Moderate atherosclerosis noted throughout both extremities. Right: 30-49% stenosis noted in the superficial femoral artery. Left: 30-49% stenosis noted in the common femoral artery. Patent mid-distal SFA stent.  See table(s) above for measurements and observations. Suggest follow up study in 12 months. Electronically signed by Quay Burow MD on 04/28/2020 at 1:43:42 PM.    Final    VAS US AORTA/IVC/ILIACS  Result Date: 04/28/2020 ABDOMINAL AORTA STUDY Indications: Patient indicates he has bilateral leg pain due to his back issues.              He recently fell twice over the last 2 days on ice in his driveway.              He uses a cane and walker, unable to determine claudication              symptoms. He denies  rest pain. Risk Factors: Hyperlipidemia, current smoker, prior MI, coronary artery               disease. Other Factors: SEE ABI AND BILATERAL LEG ARTERIAL DUPLEX REPORTS. Vascular Interventions: 2011 right CIA and EIA angioplasty, right SFA                         atherectomy and left SFA stent. Limitations: Air/bowel gas.  Comparison Study: Prior aortoiliac duplex report on 04/29/2019 showed highest                   velocities in right mid CIA at 192 cm/s, right mid EIA 195                   cm/s, left proximal CIA 195 cm/s, left proximal EIA 136 cm/s. Performing Technologist: Salvadore Dom RVT, RDCS (AE), RDMS  Examination Guidelines: A complete evaluation includes B-mode imaging, spectral Doppler, color Doppler, and power Doppler as needed of all accessible portions of each vessel. Bilateral testing is considered an integral part of a complete examination. Limited examinations for reoccurring indications may be performed as  noted.  Abdominal Aorta Findings: +-------------+-------+----------+----------+----------+--------+--------------+ Location     AP (cm)Trans (cm)PSV (cm/s)Waveform  ThrombusComments       +-------------+-------+----------+----------+----------+--------+--------------+ Proximal     2.30   1.90      68        biphasic                         +-------------+-------+----------+----------+----------+--------+--------------+ Distal                        93        biphasic                         +-------------+-------+----------+----------+----------+--------+--------------+ RT CIA Prox                   189       biphasic                         +-------------+-------+----------+----------+----------+--------+--------------+ RT CIA Mid                    171       biphasic                         +-------------+-------+----------+----------+----------+--------+--------------+ RT CIA Distal                 193       biphasic                          +-------------+-------+----------+----------+----------+--------+--------------+ RT EIA Prox                   139       biphasic                         +-------------+-------+----------+----------+----------+--------+--------------+ RT EIA Mid                    202       monophasic        overlying                                                                bowel; > 50 %                                                            stenosis       +-------------+-------+----------+----------+----------+--------+--------------+ RT EIA Distal                 85        biphasic                         +-------------+-------+----------+----------+----------+--------+--------------+ LT CIA Prox                   188       biphasic                         +-------------+-------+----------+----------+----------+--------+--------------+  LT CIA Mid                    191       biphasic                         +-------------+-------+----------+----------+----------+--------+--------------+ LT CIA Distal                 120       biphasic                         +-------------+-------+----------+----------+----------+--------+--------------+ LT EIA Prox                   182       biphasic                         +-------------+-------+----------+----------+----------+--------+--------------+ LT EIA Mid                                                overlying                                                                bowel          +-------------+-------+----------+----------+----------+--------+--------------+ LT EIA Distal                 87        biphasic                         +-------------+-------+----------+----------+----------+--------+--------------+ Visualization of the Right EIA Mid artery and Left EIA Mid artery was limited. IVC/Iliac Findings: +--------+------+--------+--------+   IVC    PatentThrombusComments +--------+------+--------+--------+ IVC Proxpatent                 +--------+------+--------+--------+    Summary: Abdominal Aorta: No evidence of an abdominal aortic aneurysm was visualized. The largest aortic measurement is 2.3 cm. Moderate atherosclerosis noted throughout aorta. Stenosis: +--------------------+-------------+ Location            Stenosis      +--------------------+-------------+ Right External Iliac>50% stenosis +--------------------+-------------+ Moderate atherosclerosis noted throughout bilateral iliac arteries. Stable velocities from prior exam. IVC/Iliac: There is no evidence of thrombus involving the IVC.  *See table(s) above for measurements and observations. Suggest follow up study in 12 months.  Electronically signed by Quay Burow MD on 04/28/2020 at 1:44:15 PM.    Final     Assessment & Plan:     Walker Kehr, MD

## 2020-12-05 ENCOUNTER — Ambulatory Visit: Payer: Medicare Other | Admitting: Internal Medicine

## 2021-01-04 ENCOUNTER — Other Ambulatory Visit: Payer: Self-pay

## 2021-01-04 ENCOUNTER — Ambulatory Visit (INDEPENDENT_AMBULATORY_CARE_PROVIDER_SITE_OTHER): Payer: Medicare Other

## 2021-01-04 DIAGNOSIS — Z23 Encounter for immunization: Secondary | ICD-10-CM | POA: Diagnosis not present

## 2021-01-20 ENCOUNTER — Other Ambulatory Visit (HOSPITAL_BASED_OUTPATIENT_CLINIC_OR_DEPARTMENT_OTHER): Payer: Self-pay | Admitting: Cardiovascular Disease

## 2021-01-20 DIAGNOSIS — I739 Peripheral vascular disease, unspecified: Secondary | ICD-10-CM

## 2021-03-06 ENCOUNTER — Other Ambulatory Visit: Payer: Self-pay | Admitting: Internal Medicine

## 2021-04-06 ENCOUNTER — Ambulatory Visit: Payer: Medicare Other | Admitting: Internal Medicine

## 2021-05-01 ENCOUNTER — Other Ambulatory Visit: Payer: Self-pay

## 2021-05-01 ENCOUNTER — Ambulatory Visit (HOSPITAL_BASED_OUTPATIENT_CLINIC_OR_DEPARTMENT_OTHER)
Admission: RE | Admit: 2021-05-01 | Discharge: 2021-05-01 | Disposition: A | Discharge status: Home / Self Care | Payer: Medicare Other | Source: Ambulatory Visit | Attending: Cardiology | Admitting: Cardiology

## 2021-05-01 ENCOUNTER — Ambulatory Visit (HOSPITAL_COMMUNITY)
Admission: RE | Admit: 2021-05-01 | Discharge: 2021-05-01 | Disposition: A | Discharge status: Home / Self Care | Payer: Medicare Other | Source: Ambulatory Visit | Attending: Cardiology | Admitting: Cardiology

## 2021-05-01 DIAGNOSIS — I739 Peripheral vascular disease, unspecified: Secondary | ICD-10-CM

## 2021-05-01 DIAGNOSIS — Z95820 Peripheral vascular angioplasty status with implants and grafts: Secondary | ICD-10-CM | POA: Diagnosis not present

## 2021-05-30 ENCOUNTER — Other Ambulatory Visit (HOSPITAL_COMMUNITY): Payer: Self-pay | Admitting: Cardiovascular Disease

## 2021-05-30 DIAGNOSIS — I708 Atherosclerosis of other arteries: Secondary | ICD-10-CM

## 2021-05-30 DIAGNOSIS — Z9862 Peripheral vascular angioplasty status: Secondary | ICD-10-CM

## 2021-05-30 DIAGNOSIS — I739 Peripheral vascular disease, unspecified: Secondary | ICD-10-CM

## 2021-06-23 ENCOUNTER — Other Ambulatory Visit: Payer: Self-pay | Admitting: Physician Assistant

## 2021-06-27 ENCOUNTER — Ambulatory Visit (INDEPENDENT_AMBULATORY_CARE_PROVIDER_SITE_OTHER): Payer: Medicare Other | Admitting: Cardiovascular Disease

## 2021-06-27 ENCOUNTER — Encounter: Payer: Self-pay | Admitting: Cardiovascular Disease

## 2021-06-27 ENCOUNTER — Other Ambulatory Visit: Payer: Self-pay

## 2021-06-27 VITALS — BP 148/72 | HR 53 | Ht 71.5 in | Wt 175.0 lb

## 2021-06-27 DIAGNOSIS — I25810 Atherosclerosis of coronary artery bypass graft(s) without angina pectoris: Secondary | ICD-10-CM

## 2021-06-27 DIAGNOSIS — I6523 Occlusion and stenosis of bilateral carotid arteries: Secondary | ICD-10-CM

## 2021-06-27 DIAGNOSIS — I480 Paroxysmal atrial fibrillation: Secondary | ICD-10-CM

## 2021-06-27 DIAGNOSIS — I5032 Chronic diastolic (congestive) heart failure: Secondary | ICD-10-CM | POA: Diagnosis not present

## 2021-06-27 DIAGNOSIS — Z720 Tobacco use: Secondary | ICD-10-CM

## 2021-06-27 DIAGNOSIS — I739 Peripheral vascular disease, unspecified: Secondary | ICD-10-CM | POA: Diagnosis not present

## 2021-06-27 NOTE — Progress Notes (Signed)
? ?Chief Complaint  ?Patient presents with  ? Follow-up  ?  CAD  ? ?History of Present Illness: 81 yo male with history of CAD s/p CABG 1993 and redo 2016, PAD, hyperlipidemia, atrial fibrillation/flutter, COPD, ongoing tobacco abuse, prostate cancer s/p prostatectomy/radiation who is here today for cardiac follow up. He has been followed in our office since 2011. He has had PTA of the occluded left SFA and stent placement May 2011 followed by atherectomy of his right SFA in June 2011. His PAD has remained stable since then. ABI were mildly abnormal in January 2023.  He is known to have moderate carotid artery disease, stable by dopplers July 2018. His CAD remained quiet from cath in 1999 until January 2016 when he was admitted with a NSTEMI and atrial fib with RVR. Cardiac cath with critical left main stenosis, LAD and Circumflex disease. Vein graft to OM occluded and LIMA to LAD to atretic. He developed cardiogenic shock with ventricular fibrillation. PTCA of left main and circumflex was performed. CT surgery did not think he was a CABG candidate. He was then readmitted with a NSTEMI in February 2016 and underwent re-do CABG (RIMA-Intermediate, S-OM, S-PDA, S-dist LAD). He had atrial fib/flutter post-op. He was placed on Eliquis. He underwent TEE guided DCCV March 2016 but converted back to atrial fib after cardioversion.  ? ?He is here today for follow up. The patient denies any chest pain, dyspnea, palpitations, lower extremity edema, orthopnea, PND, dizziness, near syncope or syncope. He has pain in legs with ambulation but no changes. No rest pain or ulcerations. He is still smoking. He is caring for his wife with dementia and feels that this is his reason for being alive today.  ? ?Primary Care Physician: Cassandria Anger, MD ? ?Past Medical History:  ?Diagnosis Date  ? ALLERGIC RHINITIS   ? Atrial flutter ([place])   ? CAD (coronary artery disease)   ? CAD s/p CABG 1993 and redo 2016,  ? Carotid artery  disease ([place])   ? bilateral  ? Chronic combined systolic and diastolic CHF (congestive heart failure) ([place])   ? CKD (chronic kidney disease), stage II   ? COPD (chronic obstructive pulmonary disease) ([place])   ? Diverticulosis   ? Hyperlipidemia   ? Osteoarthritis   ? PAF (paroxysmal atrial fibrillation) ([place])   ? Peripheral neuropathy   ? Personal history of prostate cancer   ? PVD (peripheral vascular disease) with claudication ([place])   ? Radiation proctitis   ? STEMI (ST elevation myocardial infarction) ([place]) 04/29/2014  ? PTCA Lmain and CFX, in setting of VF/VT arrest and CGS  ? Tobacco use disorder, continuous   ? Ventral hernia   ? ? ?Past Surgical History:  ?Procedure Laterality Date  ? APPENDECTOMY    ? CARDIOVERSION N/A 06/24/2014  ? Procedure: CARDIOVERSION;  Surgeon: Pixie Casino, MD;  Location: [facility];  Service: Cardiovascular;  Laterality: N/A;  ? CORONARY ARTERY BYPASS GRAFT  1992  ? CORONARY ARTERY BYPASS GRAFT N/A 05/28/2014  ? Procedure: REDO CORONARY ARTERY BYPASS GRAFTING (CABG);  Surgeon: Grace Isaac, MD;  Location: [facility];  Service: Open Heart Surgery;  Laterality: N/A;  Times 4 using right internal mammary artery to Intermediate artery and endoscopically harvested left saphenous vein to LAD, OM1, and PD coronary arteries.  ? LEFT HEART CATHETERIZATION WITH CORONARY ANGIOGRAM N/A 04/30/2014  ? Procedure: LEFT HEART CATHETERIZATION WITH CORONARY ANGIOGRAM;  Surgeon: Peter M Martinique, MD; Lmain 90% (s/p PTCA), LAD 80%, CFX  100% (s/p PTCA), RCA OK, LIMA-LAD atretic, SVG-OM 100% (chronic), EF 45%, pt req defib x 13 for VF/VT arrest, CHB w/ tem pacer, IABP and intubation  ? LUMBAR LAMINECTOMY  7510,2585   ? X2  ? PROSTATECTOMY  05/2008  ? Dr. Alinda Money and XRT  ? TEE WITHOUT CARDIOVERSION N/A 05/28/2014  ? Procedure: TRANSESOPHAGEAL ECHOCARDIOGRAM (TEE);  Surgeon: Grace Isaac, MD;  Location: [facility];  Service: Open Heart Surgery;  Laterality: N/A;  ? TEE WITHOUT CARDIOVERSION N/A 06/24/2014  ?  Procedure: TRANSESOPHAGEAL ECHOCARDIOGRAM (TEE);  Surgeon: Pixie Casino, MD;  Location: [facility] ENDOSCOPY;  Service: Cardiovascular;  Laterality: N/A;  ? TONSILLECTOMY    ? ? ?Current Outpatient Medications  ?Medication Sig Dispense Refill  ? aspirin EC 81 MG tablet Take 1 tablet (81 mg total) by mouth daily. 90 tablet 3  ? atorvastatin (LIPITOR) 40 MG tablet TAKE 1 TABLET AT BEDTIME 90 tablet 3  ? ELIQUIS 5 MG TABS tablet TAKE 1 TABLET TWICE A DAY 180 tablet 3  ? fluticasone (FLONASE) 50 MCG/ACT nasal spray USE 1 SPRAY IN EACH NOSTRIL DAILY AS NEEDED FOR ALLERGIES OR RHINITIS 48 g 5  ? folic acid (FOLVITE) 1 MG tablet TAKE 1 TABLET DAILY 90 tablet 3  ? furosemide (LASIX) 20 MG tablet TAKE 1 TABLET EVERY OTHER DAY 90 tablet 3  ? loratadine (CLARITIN) 10 MG tablet Take 10 mg by mouth daily.    ? LORazepam (ATIVAN) 0.5 MG tablet Take 0.5 mg by mouth daily as needed.    ? metoprolol succinate (TOPROL-XL) 50 MG 24 hr tablet Take 1 tablet (50 mg total) by mouth daily. Please keep upcoming appointment in March 2023 for future refills. Thank you 90 tablet 0  ? nitroGLYCERIN (NITROSTAT) 0.4 MG SL tablet Place 0.4 mg under the tongue as needed. Chest pain    ? pantoprazole (PROTONIX) 40 MG tablet TAKE 1 TABLET DAILY 90 tablet 3  ? potassium chloride (KLOR-CON) 8 MEQ tablet TAKE 1 TABLET DAILY 90 tablet 1  ? traMADol (ULTRAM) 50 MG tablet Take 1 tablet (50 mg total) by mouth every 6 (six) hours as needed for severe pain. Pain 120 tablet 1  ? zolpidem (AMBIEN) 5 MG tablet Take 1 tablet (5 mg total) by mouth at bedtime. 90 tablet 1  ? ?No current facility-administered medications for this visit.  ? ? ?Allergies  ?Allergen Reactions  ? Adhesive [Tape] Other (See Comments)  ?  Takes skin off   ? Codeine Sulfate Itching and Other (See Comments)  ?  headaches  ? Other Other (See Comments)  ?  Other reaction(s): NAUSEA,VOMITING, Headache  ? Wound Dressing Adhesive   ?  Other reaction(s): Skin irritation  ? ? ?Social History   ? ?Socioeconomic History  ? Marital status: Married  ?  Spouse name: Not on file  ? Number of children: 3  ? Years of education: Not on file  ? Highest education level: Not on file  ?Occupational History  ? Occupation: Retired-self employed  ?  Employer: RETIRED  ?Tobacco Use  ? Smoking status: Every Day  ?  Years: 40.00  ?  Types: Cigarettes  ? Smokeless tobacco: Never  ?Vaping Use  ? Vaping Use: Never used  ?Substance and Sexual Activity  ? Alcohol use: No  ? Drug use: No  ? Sexual activity: Not Currently  ?Other Topics Concern  ? Not on file  ?Social History Narrative  ? Not on file  ? ?Social Determinants of Health  ? ?  Financial Resource Strain: Not on file  ?Food Insecurity: Not on file  ?Transportation Needs: Not on file  ?Physical Activity: Not on file  ?Stress: Not on file  ?Social Connections: Not on file  ?Intimate Partner Violence: Not on file  ? ? ?Family History  ?Problem Relation Age of Onset  ? Heart disease Father   ? Coronary artery disease Father   ? Lung cancer Sister   ? Heart disease Brother   ? Hypertension Unknown   ? ? ?Review of Systems:  As stated in the HPI and otherwise negative.  ? ?BP (!) 148/72   Pulse (!) 53   Ht 5' 11.5" (1.816 m)   Wt 175 lb (79.4 kg)   SpO2 99%   BMI 24.07 kg/m?  ? ?Physical Examination: ? ?General: Well developed, well nourished, NAD  ?HEENT: OP clear, mucus membranes moist  ?SKIN: warm, dry. No rashes. ?Neuro: No focal deficits  ?Musculoskeletal: Muscle strength 5/5 all ext  ?Psychiatric: Mood and affect normal  ?Neck: No JVD, no carotid bruits, no thyromegaly, no lymphadenopathy.  ?Lungs:Clear bilaterally, no wheezes, rhonci, crackles ?Cardiovascular: Regular rate and rhythm. No murmurs, gallops or rubs. ?Abdomen:Soft. Bowel sounds present. Non-tender.  ?Extremities: No lower extremity edema. Pulses are 2 + in the bilateral DP/PT. ? ?EKG:  EKG is ordered today. ?The ekg ordered today demonstrates sinus bradycardia, PVC, non-specific T wave abn ? ?Recent  Labs: ?08/02/2020: ALT 11; BUN 15; Creatinine, Ser 1.01; Potassium 4.1; Sodium 138  ? ?Lipid Panel ?   ?Component Value Date/Time  ? CHOL 107 08/02/2020 1136  ? CHOL 119 10/20/2019 1331  ? TRIG 156.0 (H) 08/02/2020 1136  ? T

## 2021-06-27 NOTE — Patient Instructions (Signed)
Medication Instructions:  ?No changes ?*If you need a refill on your cardiac medications before your next appointment, please call your pharmacy* ? ? ?Lab Work: ?none ?If you have labs (blood work) drawn today and your tests are completely normal, you will receive your results only by: ?MyChart Message (if you have MyChart) OR ?A paper copy in the mail ?If you have any lab test that is abnormal or we need to change your treatment, we will call you to review the results. ? ? ?Testing/Procedures: ?none ? ? ?Follow-Up: ?At [facility], you and your health needs are our priority.  As part of our continuing mission to provide you with exceptional heart care, we have created designated Provider Care Teams.  These Care Teams include your primary Cardiologist (physician) and Advanced Practice Providers (APPs -  Physician Assistants and Nurse Practitioners) who all work together to provide you with the care you need, when you need it. ? ?We recommend signing up for the patient portal called "MyChart".  Sign up information is provided on this After Visit Summary.  MyChart is used to connect with patients for Virtual Visits (Telemedicine).  Patients are able to view lab/test results, encounter notes, upcoming appointments, etc.  Non-urgent messages can be sent to your provider as well.   ?To learn more about what you can do with MyChart, go to [email or web address].   ? ?Your next appointment:   ?12 month(s) ? ?The format for your next appointment:   ?In Person ? ?Provider:   ?Lauree Chandler, MD { ? ? ?

## 2021-07-03 ENCOUNTER — Other Ambulatory Visit: Payer: Self-pay | Admitting: Internal Medicine

## 2021-07-09 ENCOUNTER — Other Ambulatory Visit: Payer: Self-pay | Admitting: Internal Medicine

## 2021-07-26 ENCOUNTER — Other Ambulatory Visit: Payer: Self-pay | Admitting: Internal Medicine

## 2021-08-08 ENCOUNTER — Other Ambulatory Visit: Payer: Self-pay | Admitting: Internal Medicine

## 2021-09-13 DIAGNOSIS — Z961 Presence of intraocular lens: Secondary | ICD-10-CM | POA: Diagnosis not present

## 2021-09-13 DIAGNOSIS — H2512 Age-related nuclear cataract, left eye: Secondary | ICD-10-CM | POA: Diagnosis not present

## 2021-09-13 DIAGNOSIS — H35371 Puckering of macula, right eye: Secondary | ICD-10-CM | POA: Diagnosis not present

## 2021-09-18 ENCOUNTER — Other Ambulatory Visit: Payer: Self-pay | Admitting: Internal Medicine

## 2021-09-21 ENCOUNTER — Other Ambulatory Visit: Payer: Self-pay | Admitting: Cardiovascular Disease

## 2021-10-09 ENCOUNTER — Other Ambulatory Visit: Payer: Self-pay | Admitting: Internal Medicine

## 2021-10-11 ENCOUNTER — Other Ambulatory Visit: Payer: Self-pay | Admitting: Internal Medicine

## 2021-10-23 ENCOUNTER — Other Ambulatory Visit: Payer: Self-pay

## 2021-10-23 ENCOUNTER — Encounter (HOSPITAL_COMMUNITY): Payer: Self-pay

## 2021-10-23 ENCOUNTER — Ambulatory Visit (INDEPENDENT_AMBULATORY_CARE_PROVIDER_SITE_OTHER): Payer: Medicare Other | Admitting: Family Medicine

## 2021-10-23 ENCOUNTER — Encounter: Payer: Self-pay | Admitting: Family Medicine

## 2021-10-23 ENCOUNTER — Emergency Department (HOSPITAL_COMMUNITY)
Admission: EM | Admit: 2021-10-23 | Discharge: 2021-10-23 | Disposition: A | Discharge status: Home / Self Care | Payer: Medicare Other | Attending: Emergency Medicine | Admitting: Emergency Medicine

## 2021-10-23 ENCOUNTER — Emergency Department (HOSPITAL_COMMUNITY): Payer: Medicare Other

## 2021-10-23 VITALS — BP 158/75 | HR 46 | Temp 97.6°F | Wt 179.0 lb

## 2021-10-23 DIAGNOSIS — R001 Bradycardia, unspecified: Secondary | ICD-10-CM | POA: Diagnosis not present

## 2021-10-23 DIAGNOSIS — I6782 Cerebral ischemia: Secondary | ICD-10-CM | POA: Diagnosis not present

## 2021-10-23 DIAGNOSIS — S12112A Nondisplaced Type II dens fracture, initial encounter for closed fracture: Secondary | ICD-10-CM | POA: Diagnosis not present

## 2021-10-23 DIAGNOSIS — Z7901 Long term (current) use of anticoagulants: Secondary | ICD-10-CM | POA: Diagnosis not present

## 2021-10-23 DIAGNOSIS — Z7982 Long term (current) use of aspirin: Secondary | ICD-10-CM | POA: Insufficient documentation

## 2021-10-23 DIAGNOSIS — I25119 Atherosclerotic heart disease of native coronary artery with unspecified angina pectoris: Secondary | ICD-10-CM | POA: Insufficient documentation

## 2021-10-23 DIAGNOSIS — K627 Radiation proctitis: Secondary | ICD-10-CM | POA: Diagnosis not present

## 2021-10-23 DIAGNOSIS — W108XXA Fall (on) (from) other stairs and steps, initial encounter: Secondary | ICD-10-CM | POA: Diagnosis not present

## 2021-10-23 DIAGNOSIS — H539 Unspecified visual disturbance: Secondary | ICD-10-CM | POA: Diagnosis not present

## 2021-10-23 DIAGNOSIS — S199XXA Unspecified injury of neck, initial encounter: Secondary | ICD-10-CM | POA: Diagnosis not present

## 2021-10-23 DIAGNOSIS — Z79899 Other long term (current) drug therapy: Secondary | ICD-10-CM | POA: Diagnosis not present

## 2021-10-23 DIAGNOSIS — I10 Essential (primary) hypertension: Secondary | ICD-10-CM | POA: Diagnosis not present

## 2021-10-23 DIAGNOSIS — Z043 Encounter for examination and observation following other accident: Secondary | ICD-10-CM | POA: Diagnosis not present

## 2021-10-23 DIAGNOSIS — S0990XA Unspecified injury of head, initial encounter: Secondary | ICD-10-CM | POA: Diagnosis not present

## 2021-10-23 DIAGNOSIS — M542 Cervicalgia: Secondary | ICD-10-CM | POA: Diagnosis not present

## 2021-10-23 DIAGNOSIS — H547 Unspecified visual loss: Secondary | ICD-10-CM | POA: Diagnosis not present

## 2021-10-23 DIAGNOSIS — I6523 Occlusion and stenosis of bilateral carotid arteries: Secondary | ICD-10-CM

## 2021-10-23 DIAGNOSIS — F17200 Nicotine dependence, unspecified, uncomplicated: Secondary | ICD-10-CM | POA: Insufficient documentation

## 2021-10-23 DIAGNOSIS — S12120A Other displaced dens fracture, initial encounter for closed fracture: Secondary | ICD-10-CM | POA: Diagnosis not present

## 2021-10-23 DIAGNOSIS — S12100A Unspecified displaced fracture of second cervical vertebra, initial encounter for closed fracture: Secondary | ICD-10-CM

## 2021-10-23 DIAGNOSIS — W19XXXA Unspecified fall, initial encounter: Secondary | ICD-10-CM | POA: Diagnosis not present

## 2021-10-23 NOTE — Assessment & Plan Note (Signed)
Patient is wearing a soft collar which he applied along with the help from someone at his pharmacy on 10/21/2021.  He has not removed the collar and neck movement causes severe posterior neck pain.  Unable to fully assess neck pain in the office due to not removing his neck collar today.  Extremities are neurovascularly intact and equal strength

## 2021-10-23 NOTE — ED Provider Notes (Signed)
[name]   CSN: [id number] Arrival date & time: 10/23/21  1640     History  No chief complaint on file.   [name] is a 81 y.o. male.  Patient presents after a fall.  He is an 81 year old male who is on Eliquis and Plavix.  He had a mechanical fall 3 days ago.  He was walking into his house up some stairs.  On the last years he fell forward, hitting his head on a metal board on the wall.  There is no loss of conscious.  He mostly has pain in his neck.  He said he felt his head snapped back.  He denies any radiation down his arms.  No numbness or weakness to his extremities.  No vision changes.  No facial numbness.  He says he has some intermittent dizziness but says it is related to the pain.  He denies any other injuries from the fall.  He is compliant with his medications.  He did go to the drugstore 2 days ago about a soft collar which she is currently wearing.       Home Medications Prior to Admission medications   Medication Sig Start Date End Date Taking? Authorizing Provider  aspirin EC 81 MG tablet Take 1 tablet (81 mg total) by mouth daily. 10/03/17   Burnell Blanks, MD  atorvastatin (LIPITOR) 40 MG tablet Take 1 tablet (40 mg total) by mouth at bedtime. Follow-up is due along w/ labs must see provider for future refills 08/08/21   Plotnikov, Evie Lacks, MD  clopidogrel (PLAVIX) 300 MG TABS tablet Take 1 tablet by mouth daily.    [provider]  ELIQUIS 5 MG TABS tablet TAKE 1 TABLET TWICE A DAY 07/03/21   Plotnikov, Evie Lacks, MD  fluticasone (FLONASE) 50 MCG/ACT nasal spray USE 1 SPRAY IN EACH NOSTRIL DAILY AS NEEDED FOR ALLERGIES OR RHINITIS 09/18/21   Plotnikov, Evie Lacks, MD  folic acid (FOLVITE) 1 MG tablet TAKE 1 TABLET DAILY 03/06/21   Plotnikov, Evie Lacks, MD  furosemide (LASIX) 20 MG tablet TAKE 1 TABLET EVERY OTHER DAY 11/02/19   Plotnikov, Evie Lacks, MD  loratadine (CLARITIN) 10 MG tablet  Take 10 mg by mouth daily.    [provider]  LORazepam (ATIVAN) 0.5 MG tablet Take 0.5 mg by mouth daily as needed. 03/26/20   [provider]  metoprolol succinate (TOPROL-XL) 50 MG 24 hr tablet Take 1 tablet (50 mg total) by mouth daily. 09/21/21   Burnell Blanks, MD  nitroGLYCERIN (NITROSTAT) 0.4 MG SL tablet Place 0.4 mg under the tongue as needed. Chest pain    [provider]  pantoprazole (PROTONIX) 40 MG tablet Take 1 tablet (40 mg total) by mouth daily. Annual appt w/labs are due must see provider for future refills 10/11/21   Plotnikov, Evie Lacks, MD  potassium chloride (KLOR-CON) 8 MEQ tablet Take 1 tablet (8 mEq total) by mouth daily. Annual appt is due w/labs must see provider for future refills 10/09/21   Plotnikov, Evie Lacks, MD  traMADol (ULTRAM) 50 MG tablet Take 1 tablet (50 mg total) by mouth every 6 (six) hours as needed for severe pain. Pain 05/16/17   Plotnikov, Evie Lacks, MD  zolpidem (AMBIEN) 5 MG tablet Take 1 tablet (5 mg total) by mouth at bedtime. 04/09/19   Plotnikov, Evie Lacks, MD      Allergies    Adhesive [tape], Codeine sulfate, Hydrocodone, Hydrocodone-acetaminophen, Other, and  Wound dressing adhesive    Review of Systems   Review of Systems  Constitutional:  Negative for chills, diaphoresis, fatigue and fever.  HENT:  Negative for congestion, rhinorrhea and sneezing.   Eyes: Negative.   Respiratory:  Negative for cough, chest tightness and shortness of breath.   Cardiovascular:  Negative for chest pain and leg swelling.  Gastrointestinal:  Negative for abdominal pain, blood in stool, diarrhea, nausea and vomiting.  Genitourinary:  Negative for difficulty urinating, flank pain, frequency and hematuria.  Musculoskeletal:  Positive for neck pain. Negative for arthralgias and back pain.  Skin:  Positive for wound. Negative for rash.  Neurological:  Negative for dizziness, speech difficulty, weakness, numbness and headaches.     Physical Exam Updated Vital Signs BP (!) 147/60   Pulse (!) 50   Temp 98 F (36.7 C) (Oral)   Resp 17   Ht '5\' 11"'$  (1.803 m)   Wt 81.2 kg   SpO2 91%   BMI 24.97 kg/m  Physical Exam Constitutional:      Appearance: He is well-developed.  HENT:     Head: Normocephalic.     Comments: Small hematoma to the left forehead, no bony tenderness to the face Eyes:     Extraocular Movements: Extraocular movements intact.     Pupils: Pupils are equal, round, and reactive to light.  Neck:     Comments: No point tenderness to the cervical spine.  No pain to the thoracic or lumbosacral spine, no step-offs or deformities Cardiovascular:     Rate and Rhythm: Normal rate and regular rhythm.     Heart sounds: Normal heart sounds.  Pulmonary:     Effort: Pulmonary effort is normal. No respiratory distress.     Breath sounds: Normal breath sounds. No wheezing or rales.  Chest:     Chest wall: No tenderness.  Abdominal:     General: Bowel sounds are normal.     Palpations: Abdomen is soft.     Tenderness: There is no abdominal tenderness. There is no guarding or rebound.  Musculoskeletal:        General: Normal range of motion.     Comments: No pain on range of motion of his extremities  Lymphadenopathy:     Cervical: No cervical adenopathy.  Skin:    General: Skin is warm and dry.     Findings: No rash.  Neurological:     General: No focal deficit present.     Mental Status: He is alert and oriented to person, place, and time.     ED Results / Procedures / Treatments   Labs (all labs ordered are listed, but only abnormal results are displayed) Labs Reviewed - No data to display  EKG None  Radiology CT Head Wo Contrast  Result Date: 10/23/2021 CLINICAL DATA:  [place] and hit head EXAM: CT HEAD WITHOUT CONTRAST CT CERVICAL SPINE WITHOUT CONTRAST TECHNIQUE: Multidetector CT imaging of the head and cervical spine was performed following the standard protocol without intravenous  contrast. Multiplanar CT image reconstructions of the cervical spine were also generated. RADIATION DOSE REDUCTION: This exam was performed according to the departmental dose-optimization program which includes automated exposure control, adjustment of the mA and/or kV according to patient size and/or use of iterative reconstruction technique. COMPARISON:  None Available. FINDINGS: CT HEAD FINDINGS Brain: No acute territorial infarction, hemorrhage or intracranial mass. Mild atrophy. Minimal chronic small vessel ischemic change of the white matter. Nonenlarged ventricles Vascular: No hyperdense vessels.  Carotid vascular  calcification Skull: Normal. Negative for fracture or focal lesion. Sinuses/Orbits: No acute finding. Other: None CT CERVICAL SPINE FINDINGS Alignment: No subluxation.  Facet alignment within normal limits. Skull base and vertebrae: Craniovertebral junction appears intact. Acute appearing nondisplaced type 2 odontoid fracture. Vertebral body heights are maintained Soft tissues and spinal canal: Mild prevertebral soft tissue enlargement at the level of fracture. No visible canal hematoma. Disc levels: Multilevel degenerative change. Mild disc space narrowing at C3-C4 and moderate disease at C5-C6 and C6-C7. Facet degenerative changes at multiple levels. Advanced C1-C2 degenerative change with partially calcified pannus. Upper chest: Negative. Other: None IMPRESSION: 1. No CT evidence for acute intracranial abnormality. Atrophy and mild chronic small vessel ischemic changes of the white matter 2. Acute nondisplaced type 2 odontoid fracture. Critical Value/emergent results were called by telephone at the time of interpretation on 10/23/2021 at 7:22 pm to provider Jiah Bari , who verbally acknowledged these results. Electronically Signed   By: Donavan Foil M.D.   On: 10/23/2021 19:23   CT Cervical Spine Wo Contrast  Result Date: 10/23/2021 CLINICAL DATA:  [place] and hit head EXAM: CT HEAD WITHOUT  CONTRAST CT CERVICAL SPINE WITHOUT CONTRAST TECHNIQUE: Multidetector CT imaging of the head and cervical spine was performed following the standard protocol without intravenous contrast. Multiplanar CT image reconstructions of the cervical spine were also generated. RADIATION DOSE REDUCTION: This exam was performed according to the departmental dose-optimization program which includes automated exposure control, adjustment of the mA and/or kV according to patient size and/or use of iterative reconstruction technique. COMPARISON:  None Available. FINDINGS: CT HEAD FINDINGS Brain: No acute territorial infarction, hemorrhage or intracranial mass. Mild atrophy. Minimal chronic small vessel ischemic change of the white matter. Nonenlarged ventricles Vascular: No hyperdense vessels.  Carotid vascular calcification Skull: Normal. Negative for fracture or focal lesion. Sinuses/Orbits: No acute finding. Other: None CT CERVICAL SPINE FINDINGS Alignment: No subluxation.  Facet alignment within normal limits. Skull base and vertebrae: Craniovertebral junction appears intact. Acute appearing nondisplaced type 2 odontoid fracture. Vertebral body heights are maintained Soft tissues and spinal canal: Mild prevertebral soft tissue enlargement at the level of fracture. No visible canal hematoma. Disc levels: Multilevel degenerative change. Mild disc space narrowing at C3-C4 and moderate disease at C5-C6 and C6-C7. Facet degenerative changes at multiple levels. Advanced C1-C2 degenerative change with partially calcified pannus. Upper chest: Negative. Other: None IMPRESSION: 1. No CT evidence for acute intracranial abnormality. Atrophy and mild chronic small vessel ischemic changes of the white matter 2. Acute nondisplaced type 2 odontoid fracture. Critical Value/emergent results were called by telephone at the time of interpretation on 10/23/2021 at 7:22 pm to provider Gabryelle Whitmoyer , who verbally acknowledged these results.  Electronically Signed   By: Donavan Foil M.D.   On: 10/23/2021 19:23    Procedures Procedures    Medications Ordered in ED Medications - No data to display  ED Course/ Medical Decision Making/ A&P                           Medical Decision Making Amount and/or Complexity of Data Reviewed Independent Historian: EMS External Data Reviewed: notes. Radiology: ordered. Decision-making details documented in ED Course.  Risk OTC drugs. Decision regarding hospitalization.   Patient is a 81 year old man who presents after mechanical fall that happened 3 days ago.  He has neck pain and a hematoma to his forehead.  No other apparent injuries.  He is neurologically intact.  He  had a CT scan of his head and cervical spine.  There is no evidence of intracranial hemorrhage.  His cervical spine films show evidence of a nondisplaced type II odontoid fracture.  I consulted with the APP on-call for Dr.Pool.  They reviewed the images and felt that the patient can be managed nonoperatively.  They recommend placement of a hard collar.  He was placed in [place] collar.  He was instructed to leave this on at all times.  He was instructed to call the neurosurgeon to make an appointment for follow-up next week.  Was anxious to be discharged and did not require any prescription drug management.  Return precautions were given.  CRITICAL CARE Performed by: Malvin Johns Total critical care time: 60 minutes Critical care time was exclusive of separately billable procedures and treating other patients. Critical care was necessary to treat or prevent imminent or life-threatening deterioration. Critical care was time spent personally by me on the following activities: development of treatment plan with patient and/or surrogate as well as nursing, discussions with consultants, evaluation of patient's response to treatment, examination of patient, obtaining history from patient or surrogate, ordering and performing  treatments and interventions, ordering and review of laboratory studies, ordering and review of radiographic studies, pulse oximetry and re-evaluation of patient's condition.   Final Clinical Impression(s) / ED Diagnoses Final diagnoses:  Closed odontoid fracture, initial encounter [facility])    Rx / [place] Orders ED Discharge Orders     None         Malvin Johns, MD 10/23/21 2001

## 2021-10-23 NOTE — ED Notes (Signed)
Patient states that he doesn't have any way to get back to his car at the doctor's office.  Charge RN made aware and security contacted to inquire about them taking him to his car.

## 2021-10-23 NOTE — Assessment & Plan Note (Signed)
Reports vision blacks out with head and neck movement.  Patient being transported via EMS to the ED for further evaluation.

## 2021-10-23 NOTE — Progress Notes (Signed)
Subjective:     Patient ID: [name], male    DOB: February 02, 1941, 81 y.o.   MRN: [id number]  Chief Complaint  Patient presents with   Fall    Patient fell into a wall this past Friday and is not having severe neck pain    HPI Patient is in today for traumatic fall. States he tripped and fell into a wood wall in his house 3 days ago.  States he hit his forehead on the wall and snapped his neck backwards.  He is wearing a soft neck collar that he bought at a pharmacy and has been wearing it for the past 3 days.  Complains of frontal and posterior headache, dizziness, neck pain.  States when he moves his head and neck a certain way, he has a sharp pain and his vision goes black.   Denies numbness, tingling or weakness.   He drove himself here today. Ambulates with a walker.   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     There are no preventive care reminders to display for this patient.  Past Medical History:  Diagnosis Date   ALLERGIC RHINITIS    Atrial flutter (HCC)    CAD (coronary artery disease)    CAD s/p CABG 1993 and redo 2016,   Carotid artery disease (HCC)    bilateral   Chronic combined systolic and diastolic CHF (congestive heart failure) (HCC)    CKD (chronic kidney disease), stage II    COPD (chronic obstructive pulmonary disease) (HCC)    Diverticulosis    Hyperlipidemia    Osteoarthritis    PAF (paroxysmal atrial fibrillation) (HCC)    Peripheral neuropathy    Personal history of prostate cancer    PVD (peripheral vascular disease) with claudication (HCC)    Radiation proctitis    STEMI (ST elevation myocardial infarction) ([facility]) 04/29/2014   PTCA Lmain and CFX, in setting of VF/VT arrest and CGS   Tobacco use disorder, continuous    Ventral hernia     Past Surgical History:  Procedure Laterality Date   APPENDECTOMY     CARDIOVERSION N/A 06/24/2014   Procedure: CARDIOVERSION;  Surgeon: Pixie Casino, MD;  Location: [facility];   Service: Cardiovascular;  Laterality: N/A;   CORONARY ARTERY BYPASS GRAFT  1992   CORONARY ARTERY BYPASS GRAFT N/A 05/28/2014   Procedure: REDO CORONARY ARTERY BYPASS GRAFTING (CABG);  Surgeon: Grace Isaac, MD;  Location: [facility];  Service: Open Heart Surgery;  Laterality: N/A;  Times 4 using right internal mammary artery to Intermediate artery and endoscopically harvested left saphenous vein to LAD, OM1, and PD coronary arteries.   LEFT HEART CATHETERIZATION WITH CORONARY ANGIOGRAM N/A 04/30/2014   Procedure: LEFT HEART CATHETERIZATION WITH CORONARY ANGIOGRAM;  Surgeon: Peter M Martinique, MD; Lmain 90% (s/p PTCA), LAD 80%, CFX 100% (s/p PTCA), RCA OK, LIMA-LAD atretic, SVG-OM 100% (chronic), EF 45%, pt req defib x 13 for VF/VT arrest, CHB w/ tem pacer, IABP and intubation   LUMBAR LAMINECTOMY  [id number]    X2   PROSTATECTOMY  05/2008   Dr. Alinda Money and XRT   TEE WITHOUT CARDIOVERSION N/A 05/28/2014   Procedure: TRANSESOPHAGEAL ECHOCARDIOGRAM (TEE);  Surgeon: Grace Isaac, MD;  Location: [place];  Service: Open Heart Surgery;  Laterality: N/A;   TEE WITHOUT CARDIOVERSION N/A 06/24/2014   Procedure: TRANSESOPHAGEAL ECHOCARDIOGRAM (TEE);  Surgeon: Pixie Casino, MD;  Location: [place] [facility];  Service: Cardiovascular;  Laterality: N/A;   TONSILLECTOMY  Family History  Problem Relation Age of Onset   Heart disease Father    Coronary artery disease Father    Lung cancer Sister    Heart disease Brother    Hypertension Unknown     Social History   Socioeconomic History   Marital status: Married    Spouse name: Not on file   Number of children: 3   Years of education: Not on file   Highest education level: Not on file  Occupational History   Occupation: Retired-self employed    Employer: RETIRED  Tobacco Use   Smoking status: Every Day    Years: 40.00    Types: Cigarettes   Smokeless tobacco: Never  Vaping Use   Vaping Use: Never used  Substance and Sexual Activity   Alcohol  use: No   Drug use: No   Sexual activity: Not Currently  Other Topics Concern   Not on file  Social History Narrative   Not on file   Social Determinants of Health   Financial Resource Strain: Low Risk  (10/16/2017)   Overall Financial Resource Strain (CARDIA)    Difficulty of Paying Living Expenses: Not hard at all  Food Insecurity: No Food Insecurity (10/16/2017)   Hunger Vital Sign    Worried About Running Out of Food in the Last Year: Never true    [place] in the Last Year: Never true  Transportation Needs: No Transportation Needs (10/16/2017)   PRAPARE - Hydrologist (Medical): No    Lack of Transportation (Non-Medical): No  Physical Activity: Inactive (10/16/2017)   Exercise Vital Sign    Days of Exercise per Week: 0 days    Minutes of Exercise per Session: 0 min  Stress: No Stress Concern Present (10/16/2017)   [place] of Stress : Not at all  Social Connections: [place] (10/16/2017)   Social Connection and Isolation Panel [NHANES]    Frequency of Communication with Friends and Family: More than three times a week    Frequency of Social Gatherings with Friends and Family: More than three times a week    Attends Religious Services: 1 to 4 times per year    Active Member of [employer] or Organizations: Yes    Attends Music therapist: More than 4 times per year    Marital Status: Married  Human resources officer Violence: Not At Risk (10/16/2017)   Humiliation, Afraid, Rape, and Kick questionnaire    Fear of Current or Ex-Partner: No    Emotionally Abused: No    Physically Abused: No    Sexually Abused: No    Outpatient Medications Prior to Visit  Medication Sig Dispense Refill   aspirin EC 81 MG tablet Take 1 tablet (81 mg total) by mouth daily. 90 tablet 3   atorvastatin (LIPITOR) 40 MG tablet Take 1 tablet (40 mg total) by mouth at bedtime.  Follow-up is due along w/ labs must see provider for future refills 90 tablet 0   ELIQUIS 5 MG TABS tablet TAKE 1 TABLET TWICE A DAY 180 tablet 3   fluticasone (FLONASE) 50 MCG/ACT nasal spray USE 1 SPRAY IN EACH NOSTRIL DAILY AS NEEDED FOR ALLERGIES OR RHINITIS 48 g 0   folic acid (FOLVITE) 1 MG tablet TAKE 1 TABLET DAILY 90 tablet 3   furosemide (LASIX) 20 MG tablet TAKE 1 TABLET EVERY OTHER DAY 90 tablet 3   loratadine (CLARITIN)  10 MG tablet Take 10 mg by mouth daily.     LORazepam (ATIVAN) 0.5 MG tablet Take 0.5 mg by mouth daily as needed.     metoprolol succinate (TOPROL-XL) 50 MG 24 hr tablet Take 1 tablet (50 mg total) by mouth daily. 90 tablet 2   nitroGLYCERIN (NITROSTAT) 0.4 MG SL tablet Place 0.4 mg under the tongue as needed. Chest pain     pantoprazole (PROTONIX) 40 MG tablet Take 1 tablet (40 mg total) by mouth daily. Annual appt w/labs are due must see provider for future refills 30 tablet 0   potassium chloride (KLOR-CON) 8 MEQ tablet Take 1 tablet (8 mEq total) by mouth daily. Annual appt is due w/labs must see provider for future refills 90 tablet 0   traMADol (ULTRAM) 50 MG tablet Take 1 tablet (50 mg total) by mouth every 6 (six) hours as needed for severe pain. Pain 120 tablet 1   zolpidem (AMBIEN) 5 MG tablet Take 1 tablet (5 mg total) by mouth at bedtime. 90 tablet 1   clopidogrel (PLAVIX) 300 MG TABS tablet Take 1 tablet by mouth daily.     No facility-administered medications prior to visit.    Allergies  Allergen Reactions   Adhesive [Tape] Other (See Comments)    Takes skin off    Codeine Sulfate Itching and Other (See Comments)    headaches   Hydrocodone    Hydrocodone-Acetaminophen Other (See Comments)   Other Other (See Comments)    Other reaction(s): NAUSEA,VOMITING, Headache   Wound Dressing Adhesive     Other reaction(s): Skin irritation    ROS     Objective:    Physical Exam HENT:     Head:      Comments: Hematoma on left forehead  Neck:      Comments: Soft collar in place upon arrival and not removed during visit.  Cardiovascular:     Rate and Rhythm: Normal rate.  Pulmonary:     Effort: Pulmonary effort is normal.  Neurological:     Mental Status: He is alert and oriented to person, place, and time.     Cranial Nerves: No facial asymmetry.     Sensory: Sensation is intact.     Motor: No weakness.  Psychiatric:        Mood and Affect: Mood normal.        Speech: Speech normal.        Cognition and Memory: Cognition normal.     BP (!) 158/75 (BP Location: Right Arm, Patient Position: Sitting, Cuff Size: Large)   Pulse (!) 46   Temp 97.6 F (36.4 C) (Oral)   Wt 179 lb (81.2 kg)   SpO2 96%   BMI 24.62 kg/m  Wt Readings from Last 3 Encounters:  10/23/21 179 lb (81.2 kg)  06/27/21 175 lb (79.4 kg)  11/30/20 172 lb 6.4 oz (78.2 kg)       Assessment & Plan:   Problem List Items Addressed This Visit       Other   Head trauma - Primary    Head and neck trauma from fall in his home on 10/20/2021.  Denies loss of consciousness hematoma of forehead with anterior and posterior headache constant and worse with movement.  Concern for subdural hematoma.  EMS called and patient transported to the ED for further evaluation.  Patient alert and oriented upon discharge from this office.      Injury of neck    Patient is wearing a soft  collar which he applied along with the help from someone at his pharmacy on 10/21/2021.  He has not removed the collar and neck movement causes severe posterior neck pain.  Unable to fully assess neck pain in the office due to not removing his neck collar today.  Extremities are neurovascularly intact and equal strength      On anticoagulant therapy   Vision abnormalities    Reports vision blacks out with head and neck movement.  Patient being transported via EMS to the ED for further evaluation.       I am having Tamela Gammon. [place] maintain his nitroGLYCERIN, loratadine, traMADol,  aspirin EC, zolpidem, furosemide, LORazepam, folic acid, Eliquis, atorvastatin, fluticasone, metoprolol succinate, potassium chloride, pantoprazole, and clopidogrel.  No orders of the defined types were placed in this encounter.

## 2021-10-23 NOTE — ED Notes (Signed)
Patient was seen standing up at door waiting to go to restroom. Patient was told standing up was not safe as we have performed imaging on head and neck. Patient was then given a urinal.

## 2021-10-23 NOTE — ED Notes (Signed)
Patient updated on plan of care.  Patient verbalized understanding.

## 2021-10-23 NOTE — Discharge Instructions (Addendum)
Call tomorrow to make an appointment with the neurosurgeon listed above to follow-up next week.  Keep the collar on at all times.  Return to the emergency room if you have any worsening symptoms.

## 2021-10-23 NOTE — Assessment & Plan Note (Addendum)
Head and neck trauma from fall in his home on 10/20/2021.  Denies loss of consciousness hematoma of forehead with anterior and posterior headache constant and worse with movement.  Concern for subdural hematoma.  EMS called and patient transported to the ED for further evaluation.  Patient alert and oriented upon discharge from this office.

## 2021-10-23 NOTE — ED Triage Notes (Addendum)
Patient BIB EMS from doctors office. Patient had a fall on Friday hitting his head. Patient is on blood thinners, Eliquis and Plavix. No imaging done to the head or neck. Patient denies changes in vision and pain radiation.

## 2021-10-23 NOTE — ED Notes (Signed)
Patient left with security to take him back to his vehicle.  Patient ambulatory with a steady gait.

## 2021-10-25 ENCOUNTER — Telehealth: Payer: Self-pay | Admitting: Internal Medicine

## 2021-10-25 NOTE — Telephone Encounter (Signed)
No note needed 

## 2021-10-27 DIAGNOSIS — S12112A Nondisplaced Type II dens fracture, initial encounter for closed fracture: Secondary | ICD-10-CM | POA: Diagnosis not present

## 2021-11-06 ENCOUNTER — Other Ambulatory Visit: Payer: Self-pay | Admitting: Internal Medicine

## 2021-11-16 DIAGNOSIS — S12112A Nondisplaced Type II dens fracture, initial encounter for closed fracture: Secondary | ICD-10-CM | POA: Diagnosis not present

## 2021-12-05 ENCOUNTER — Ambulatory Visit (INDEPENDENT_AMBULATORY_CARE_PROVIDER_SITE_OTHER): Payer: Medicare Other

## 2021-12-05 ENCOUNTER — Telehealth: Payer: Self-pay | Admitting: Internal Medicine

## 2021-12-05 DIAGNOSIS — Z0000 Encounter for general adult medical examination without abnormal findings: Secondary | ICD-10-CM

## 2021-12-05 NOTE — Progress Notes (Cosign Needed Addendum)
Subjective:   [name] is a 81 y.o. male who presents for Medicare Annual/Subsequent preventive examination.  Review of Systems    Virtual Visit via Telephone Note  I connected with  [name] on 12/05/21 at  4:00 PM EDT by telephone and verified that I am speaking with the correct person using two identifiers.  Location: Patient: Home Provider: [place] Persons participating in the virtual visit: [place]   I discussed the limitations, risks, security and privacy concerns of performing an evaluation and management service by telephone and the availability of in person appointments. The patient expressed understanding and agreed to proceed.  Interactive audio and video telecommunications were attempted between this nurse and patient, however failed, due to patient having technical difficulties OR patient did not have access to video capability.  We continued and completed visit with audio only.  Some vital signs may be absent or patient reported.   Sheral Flow, LPN  Cardiac Risk Factors include: advanced age (>30mn, >>7women);dyslipidemia;hypertension;male gender;smoking/ tobacco exposure     Objective:    There were no vitals filed for this visit. There is no height or weight on file to calculate BMI.     12/05/2021    4:32 PM 10/16/2017   11:25 AM 10/04/2016    2:55 PM 08/30/2014   12:43 PM 08/20/2014   12:19 PM 08/12/2014    3:37 PM 06/24/2014    1:27 PM  Advanced Directives  Does Patient Have a Medical Advance Directive? Yes Yes Yes No No No No  Type of Advance Directive Living will;Healthcare Power of [place]      Does patient want to make changes to medical advance directive? No - Patient declined        Copy of [place] Chart? No - copy requested No - copy requested No - copy requested      Would patient  like information on creating a medical advance directive?    Yes - EScientist, clinical (histocompatibility and immunogenetics)given Yes - EScientist, clinical (histocompatibility and immunogenetics)given Yes - EScientist, clinical (histocompatibility and immunogenetics)given     Current Medications (verified) Outpatient Encounter Medications as of 12/05/2021  Medication Sig   aspirin EC 81 MG tablet Take 1 tablet (81 mg total) by mouth daily.   atorvastatin (LIPITOR) 40 MG tablet Take 1 tablet (40 mg total) by mouth at bedtime. Follow-up is due along w/ labs must see provider for future refills   clopidogrel (PLAVIX) 300 MG TABS tablet Take 1 tablet by mouth daily.   ELIQUIS 5 MG TABS tablet TAKE 1 TABLET TWICE A DAY   fluticasone (FLONASE) 50 MCG/ACT nasal spray USE 1 SPRAY IN EACH NOSTRIL DAILY AS NEEDED FOR ALLERGIES OR RHINITIS   folic acid (FOLVITE) 1 MG tablet TAKE 1 TABLET DAILY   furosemide (LASIX) 20 MG tablet TAKE 1 TABLET EVERY OTHER DAY   loratadine (CLARITIN) 10 MG tablet Take 10 mg by mouth daily.   LORazepam (ATIVAN) 0.5 MG tablet Take 0.5 mg by mouth daily as needed.   metoprolol succinate (TOPROL-XL) 50 MG 24 hr tablet Take 1 tablet (50 mg total) by mouth daily.   nitroGLYCERIN (NITROSTAT) 0.4 MG SL tablet Place 0.4 mg under the tongue as needed. Chest pain   pantoprazole (PROTONIX) 40 MG tablet Take 1 tablet (40 mg total) by mouth daily. Annual appt w/labs are due must see provider for future refills   potassium chloride (KLOR-CON) 8  MEQ tablet Take 1 tablet (8 mEq total) by mouth daily. Annual appt is due w/labs must see provider for future refills   traMADol (ULTRAM) 50 MG tablet Take 1 tablet (50 mg total) by mouth every 6 (six) hours as needed for severe pain. Pain   zolpidem (AMBIEN) 5 MG tablet Take 1 tablet (5 mg total) by mouth at bedtime.   No facility-administered encounter medications on file as of 12/05/2021.    Allergies (verified) Adhesive [tape], Codeine sulfate, Hydrocodone, Hydrocodone-acetaminophen, Other, and Wound dressing adhesive   History: Past Medical  History:  Diagnosis Date   ALLERGIC RHINITIS    Atrial flutter (HCC)    CAD (coronary artery disease)    CAD s/p CABG 1993 and redo 2016,   Carotid artery disease ([place])    bilateral   Chronic combined systolic and diastolic CHF (congestive heart failure) (HCC)    CKD (chronic kidney disease), stage II    COPD (chronic obstructive pulmonary disease) (HCC)    Diverticulosis    Hyperlipidemia    Osteoarthritis    PAF (paroxysmal atrial fibrillation) (HCC)    Peripheral neuropathy    Personal history of prostate cancer    PVD (peripheral vascular disease) with claudication (HCC)    Radiation proctitis    STEMI (ST elevation myocardial infarction) ([place]) 04/29/2014   PTCA Lmain and CFX, in setting of VF/VT arrest and CGS   Tobacco use disorder, continuous    Ventral hernia    Past Surgical History:  Procedure Laterality Date   APPENDECTOMY     CARDIOVERSION N/A 06/24/2014   Procedure: CARDIOVERSION;  Surgeon: Pixie Casino, MD;  Location: [facility];  Service: Cardiovascular;  Laterality: N/A;   CORONARY ARTERY BYPASS GRAFT  1992   CORONARY ARTERY BYPASS GRAFT N/A 05/28/2014   Procedure: REDO CORONARY ARTERY BYPASS GRAFTING (CABG);  Surgeon: Grace Isaac, MD;  Location: [facility];  Service: Open Heart Surgery;  Laterality: N/A;  Times 4 using right internal mammary artery to Intermediate artery and endoscopically harvested left saphenous vein to LAD, OM1, and PD coronary arteries.   LEFT HEART CATHETERIZATION WITH CORONARY ANGIOGRAM N/A 04/30/2014   Procedure: LEFT HEART CATHETERIZATION WITH CORONARY ANGIOGRAM;  Surgeon: Peter M Martinique, MD; Lmain 90% (s/p PTCA), LAD 80%, CFX 100% (s/p PTCA), RCA OK, LIMA-LAD atretic, SVG-OM 100% (chronic), EF 45%, pt req defib x 13 for VF/VT arrest, CHB w/ tem pacer, IABP and intubation   LUMBAR LAMINECTOMY  [id number]    X2   PROSTATECTOMY  05/2008   Dr. Alinda Money and XRT   TEE WITHOUT CARDIOVERSION N/A 05/28/2014   Procedure: TRANSESOPHAGEAL  ECHOCARDIOGRAM (TEE);  Surgeon: Grace Isaac, MD;  Location: [facility];  Service: Open Heart Surgery;  Laterality: N/A;   TEE WITHOUT CARDIOVERSION N/A 06/24/2014   Procedure: TRANSESOPHAGEAL ECHOCARDIOGRAM (TEE);  Surgeon: Pixie Casino, MD;  Location: [facility] ENDOSCOPY;  Service: Cardiovascular;  Laterality: N/A;   TONSILLECTOMY     Family History  Problem Relation Age of Onset   Heart disease Father    Coronary artery disease Father    Lung cancer Sister    Heart disease Brother    Hypertension Unknown    Social History   Socioeconomic History   Marital status: Married    Spouse name: Not on file   Number of children: 3   Years of education: Not on file   Highest education level: Not on file  Occupational History   Occupation: Retired-self employed    Employer: RETIRED  Tobacco Use   Smoking status: Every Day    Years: 40.00    Types: Cigarettes   Smokeless tobacco: Never  Vaping Use   Vaping Use: Never used  Substance and Sexual Activity   Alcohol use: No   Drug use: No   Sexual activity: Not Currently  Other Topics Concern   Not on file  Social History Narrative   Not on file   Social Determinants of Health   Financial Resource Strain: Low Risk  (12/05/2021)   Overall Financial Resource Strain (CARDIA)    Difficulty of Paying Living Expenses: Not hard at all  Food Insecurity: No Food Insecurity (12/05/2021)   Hunger Vital Sign    Worried About Running Out of Food in the Last Year: Never true    Ran Out of Food in the Last Year: Never true  Transportation Needs: No Transportation Needs (12/05/2021)   PRAPARE - Hydrologist (Medical): No    Lack of Transportation (Non-Medical): No  Physical Activity: Sufficiently Active (12/05/2021)   Exercise Vital Sign    Days of Exercise per Week: 5 days    Minutes of Exercise per Session: 30 min  Stress: No Stress Concern Present (12/05/2021)   [place]    Feeling of Stress : Not at all  Social Connections: [place] (12/05/2021)   Social Connection and Isolation Panel [NHANES]    Frequency of Communication with Friends and Family: More than three times a week    Frequency of Social Gatherings with Friends and Family: More than three times a week    Attends Religious Services: More than 4 times per year    Active Member of [employer] or Organizations: Yes    Attends Music therapist: More than 4 times per year    Marital Status: Married    Tobacco Counseling Ready to quit: Not Answered Counseling given: Not Answered   Clinical Intake:  Pre-visit preparation completed: Yes  Pain : No/denies pain     BMI - recorded: 24.62 (10/23/2021) Nutritional Status: BMI of 19-24  Normal Nutritional Risks: None Diabetes: No  How often do you need to have someone help you when you read instructions, pamphlets, or other written materials from your doctor or pharmacy?: 1 - Never What is the last grade level you completed in school?: HSG; 3 years of college  Diabetic? no  Interpreter Needed?: No  Information entered by :: Amy Hopkins, LPN   Activities of Daily Living    12/05/2021    4:32 PM  In your present state of health, do you have any difficulty performing the following activities:  Hearing? 1  Vision? 1  Difficulty concentrating or making decisions? 1  Walking or climbing stairs? 1  Dressing or bathing? 1  Doing errands, shopping? 1  Preparing Food and eating ? Y  Using the Toilet? Y  In the past six months, have you accidently leaked urine? Y  Do you have problems with loss of bowel control? Y  Managing your Medications? Y  Managing your Finances? Y  Housekeeping or managing your Housekeeping? Y    Patient Care Team: Plotnikov, Evie Lacks, MD as PCP - General Burnell Blanks, MD as PCP - Cardiology (Cardiology) Raynelle Bring, MD as Consulting Physician  (Urology) Burnell Blanks, MD as Consulting Physician (Cardiology) Jola Schmidt, MD as Referring Physician (Ophthalmology)  Indicate any recent Medical Services you may have received from other than  [facility] providers in the past year (date may be approximate).     Assessment:   This is a routine wellness examination for Victoria.  Hearing/Vision screen Hearing Screening - Comments:: Denies hearing difficulties   Vision Screening - Comments:: Wears rx glasses - up to date with routine eye exams with Jola Schmidt, MD.   Dietary issues and exercise activities discussed: Current Exercise Habits: Home exercise routine, Type of exercise: walking, Time (Minutes): 30, Frequency (Times/Week): 5, Weekly Exercise (Minutes/Week): 150, Intensity: Moderate, Exercise limited by: respiratory conditions(s);orthopedic condition(s);cardiac condition(s)   Goals Addressed   None   Depression Screen    12/05/2021    4:30 PM 10/23/2021    4:04 PM 02/01/2020   11:01 AM 01/07/2019   10:51 AM 10/16/2017   11:25 AM 10/04/2016    2:55 PM 09/12/2015    1:18 PM  PHQ 2/9 Scores  PHQ - 2 Score 0 0 0 0 0 0 0    Fall Risk    10/23/2021    4:03 PM 02/01/2020   11:01 AM 01/07/2019   10:50 AM 10/16/2017   11:25 AM 10/04/2016    2:55 PM  Fall Risk   Falls in the past year? 1 0 0 No Yes  Number falls in past yr: 0 0   1  Injury with Fall? 1 0   No  Risk for fall due to : History of fall(s);Impaired balance/gait;Impaired mobility   Impaired balance/gait Impaired mobility  Follow up Falls evaluation completed  Falls evaluation completed  Falls prevention discussed    FALL RISK PREVENTION PERTAINING TO THE HOME:  Any stairs in or around the home? Yes  If so, are there any without handrails? No  Home free of loose throw rugs in walkways, pet beds, electrical cords, etc? Yes Adequate lighting in your home to reduce risk of falls? Yes   ASSISTIVE DEVICES UTILIZED TO PREVENT FALLS:  Life alert? No  Use of a cane,  walker or w/c? Yes  Grab bars in the bathroom? Yes  Shower chair or bench in shower? Yes  Elevated toilet seat or a handicapped toilet? No   TIMED UP AND GO:  Was the test performed? NO. Telephone Visit  Cognitive Function:    10/16/2017   11:26 AM 10/04/2016    2:55 PM  MMSE - Mini Mental State Exam  Orientation to time 5 5  Orientation to Place 5 5  Registration 3 3  Attention/ Calculation 5 5  Recall 3 3  Language- name 2 objects 2 2  Language- repeat 1 1  Language- follow 3 step command 3 3  Language- read & follow direction 1 1  Write a sentence 1 1  Copy design 1 1  Total score 30 30        12/05/2021    4:33 PM  6CIT Screen  What Year? 0 points  What month? 0 points  What time? 0 points  Count back from 20 0 points  Months in reverse 0 points  Repeat phrase 0 points  Total Score 0 points    Immunizations Immunization History  Administered Date(s) Administered   Fluad Quad(high Dose 65+) 01/07/2019, 02/01/2020   H1N1 03/16/2008   Influenza Split 02/20/2012   Influenza Whole 01/07/2008, 02/05/2008, 02/01/2009, 01/25/2010   Influenza, High Dose Seasonal PF 12/19/2015, 01/10/2017, 01/28/2018   Influenza,inj,Quad PF,6+ Mos 12/24/2012, 01/11/2014, 02/08/2015, 01/04/2021   Influenza-Unspecified 02/01/1995, 01/07/1997, 01/19/2000, 01/23/2001, 01/16/2002, 02/15/2003, 02/10/2004, 01/07/2005, 01/07/2006, 01/14/2007, 01/21/2009, 02/06/2010, 01/04/2011, 02/08/2012, 01/07/2013, 01/07/2014, 02/08/2015, 12/09/2015,  01/07/2017, 01/07/2018, 12/09/2018, 01/07/2020   PFIZER Comirnaty(Gray Top)Covid-19 Tri-Sucrose Vaccine 08/03/2020   PFIZER(Purple Top)SARS-COV-2 Vaccination 05/04/2019, 05/25/2019, 01/14/2020, 08/03/2020   PNEUMOCOCCAL CONJUGATE-20 11/30/2020   Pfizer Covid-19 Vaccine Bivalent Booster 82yr & up 01/07/2021   Pneumococcal Conjugate-13 04/14/2014, 07/27/2014   Pneumococcal Polysaccharide-23 03/17/1999, 04/23/2005, 04/14/2014, 03/19/2016   Td 09/22/1997,  02/20/2010   Tdap 07/08/2012, 07/22/2012   Zoster Recombinat (Shingrix) 04/25/2017, 09/19/2017    TDAP status: Up to date  Flu Vaccine status: Due, Education has been provided regarding the importance of this vaccine. Advised may receive this vaccine at local pharmacy or Health Dept. Aware to provide a copy of the vaccination record if obtained from local pharmacy or Health Dept. Verbalized acceptance and understanding.  Pneumococcal vaccine status: Up to date  Covid-19 vaccine status: Completed vaccines  Qualifies for Shingles Vaccine? Yes   Zostavax completed No   Shingrix Completed?: Yes  Screening Tests Health Maintenance  Topic Date Due   COVID-19 Vaccine (7 - Pfizer risk series) 03/04/2021   INFLUENZA VACCINE  11/07/2021   TETANUS/TDAP  07/23/2022   Pneumonia Vaccine 81 Years old  Completed   Zoster Vaccines- Shingrix  Completed   HPV VACCINES  Aged Out    Health Maintenance  Health Maintenance Due  Topic Date Due   COVID-19 Vaccine (7 - Pfizer risk series) 03/04/2021   INFLUENZA VACCINE  11/07/2021    Colorectal cancer screening: No longer required.   Lung Cancer Screening: (Low Dose CT Chest recommended if Age 83-80years, 30 pack-year currently smoking OR have quit w/in 15years.) does not qualify.   Lung Cancer Screening Referral: no  Additional Screening:  Hepatitis C Screening: does not qualify; Completed no  Vision Screening: Recommended annual ophthalmology exams for early detection of glaucoma and other disorders of the eye. Is the patient up to date with their annual eye exam?  Yes  Who is the provider or what is the name of the office in which the patient attends annual eye exams? AJola Schmidt MD. If pt is not established with a provider, would they like to be referred to a provider to establish care? No .   Dental Screening: Recommended annual dental exams for proper oral hygiene  Community Resource Referral / Chronic Care Management: CRR required  this visit?  No   CCM required this visit?  No      Plan:     I have personally reviewed and noted the following in the patient's chart:   Medical and social history Use of alcohol, tobacco or illicit drugs  Current medications and supplements including opioid prescriptions. Patient is not currently taking opioid prescriptions. Functional ability and status Nutritional status Physical activity Advanced directives List of other physicians Hospitalizations, surgeries, and ER visits in previous 12 months Vitals Screenings to include cognitive, depression, and falls Referrals and appointments  In addition, I have reviewed and discussed with patient certain preventive protocols, quality metrics, and best practice recommendations. A written personalized care plan for preventive services as well as general preventive health recommendations were provided to patient.     SSheral Flow LPN   [id number]  Nurse Notes:  Patient is cogitatively intact. There were no vitals filed for this visit. There is no height or weight on file to calculate BMI.  Medical screening examination/treatment/procedure(s) were performed by non-physician practitioner and as supervising physician I was immediately available for consultation/collaboration.  I agree with above. ALew Dawes MD

## 2021-12-05 NOTE — Patient Instructions (Signed)
[name] , Thank you for taking time to come for your Medicare Wellness Visit. I appreciate your ongoing commitment to your health goals. Please review the following plan we discussed and let me know if I can assist you in the future.   Screening recommendations/referrals: Colonoscopy: Discontinued  Recommended yearly ophthalmology/optometry visit for glaucoma screening and checkup Recommended yearly dental visit for hygiene and checkup  Vaccinations: Influenza vaccine: due Fall 2023 Pneumococcal vaccine: 03/19/2016 (Pneumococcal 23), 11/30/2020 (Prevnar 20 and Prevnar 13) Tdap vaccine: 07/22/2012; due every 10 years Shingles vaccine: 04/25/2017, 6/13//2019 Covid-19: 05/04/2019, 05/25/2019, 01/14/2020, 08/03/2020, 01/07/2021  Advanced directives: Yes; Please bring a copy of your health care power of attorney and living will to the office at your convenience.  Conditions/risks identified: Yes  Next appointment: Follow up in one year for your annual wellness visit.   Preventive Care 38 Years and Older, Male  Preventive care refers to lifestyle choices and visits with your health care provider that can promote health and wellness. What does preventive care include? A yearly physical exam. This is also called an annual well check. Dental exams once or twice a year. Routine eye exams. Ask your health care provider how often you should have your eyes checked. Personal lifestyle choices, including: Daily care of your teeth and gums. Regular physical activity. Eating a healthy diet. Avoiding tobacco and drug use. Limiting alcohol use. Practicing safe sex. Taking low doses of aspirin every day. Taking vitamin and mineral supplements as recommended by your health care provider. What happens during an annual well check? The services and screenings done by your health care provider during your annual well check will depend on your age, overall health, lifestyle risk factors, and family  history of disease. Counseling  Your health care provider may ask you questions about your: Alcohol use. Tobacco use. Drug use. Emotional well-being. Home and relationship well-being. Sexual activity. Eating habits. History of falls. Memory and ability to understand (cognition). Work and work Statistician. Screening  You may have the following tests or measurements: Height, weight, and BMI. Blood pressure. Lipid and cholesterol levels. These may be checked every 5 years, or more frequently if you are over 28 years old. Skin check. Lung cancer screening. You may have this screening every year starting at age 23 if you have a 30-pack-year history of smoking and currently smoke or have quit within the past 15 years. Fecal occult blood test (FOBT) of the stool. You may have this test every year starting at age 26. Flexible sigmoidoscopy or colonoscopy. You may have a sigmoidoscopy every 5 years or a colonoscopy every 10 years starting at age 42. Prostate cancer screening. Recommendations will vary depending on your family history and other risks. Hepatitis C blood test. Hepatitis B blood test. Sexually transmitted disease (STD) testing. Diabetes screening. This is done by checking your blood sugar (glucose) after you have not eaten for a while (fasting). You may have this done every 1-3 years. Abdominal aortic aneurysm (AAA) screening. You may need this if you are a current or former smoker. Osteoporosis. You may be screened starting at age 62 if you are at high risk. Talk with your health care provider about your test results, treatment options, and if necessary, the need for more tests. Vaccines  Your health care provider may recommend certain vaccines, such as: Influenza vaccine. This is recommended every year. Tetanus, diphtheria, and acellular pertussis (Tdap, Td) vaccine. You may need a Td booster every 10 years. Zoster vaccine. You may need  this after age 51. Pneumococcal  13-valent conjugate (PCV13) vaccine. One dose is recommended after age 40. Pneumococcal polysaccharide (PPSV23) vaccine. One dose is recommended after age 58. Talk to your health care provider about which screenings and vaccines you need and how often you need them. This information is not intended to replace advice given to you by your health care provider. Make sure you discuss any questions you have with your health care provider. Document Released: 04/22/2015 Document Revised: 12/14/2015 Document Reviewed: 01/25/2015 Elsevier Interactive Patient Education  2017 [employer] Prevention in the Home Falls can cause injuries. They can happen to people of all ages. There are many things you can do to make your home safe and to help prevent falls. What can I do on the outside of my home? Regularly fix the edges of walkways and driveways and fix any cracks. Remove anything that might make you trip as you walk through a door, such as a raised step or threshold. Trim any bushes or trees on the path to your home. Use bright outdoor lighting. Clear any walking paths of anything that might make someone trip, such as rocks or tools. Regularly check to see if handrails are loose or broken. Make sure that both sides of any steps have handrails. Any raised decks and porches should have guardrails on the edges. Have any leaves, snow, or ice cleared regularly. Use sand or salt on walking paths during winter. Clean up any spills in your garage right away. This includes oil or grease spills. What can I do in the bathroom? Use night lights. Install grab bars by the toilet and in the tub and shower. Do not use towel bars as grab bars. Use non-skid mats or decals in the tub or shower. If you need to sit down in the shower, use a plastic, non-slip stool. Keep the floor dry. Clean up any water that spills on the floor as soon as it happens. Remove soap buildup in the tub or shower regularly. Attach  bath mats securely with double-sided non-slip rug tape. Do not have throw rugs and other things on the floor that can make you trip. What can I do in the bedroom? Use night lights. Make sure that you have a light by your bed that is easy to reach. Do not use any sheets or blankets that are too big for your bed. They should not hang down onto the floor. Have a firm chair that has side arms. You can use this for support while you get dressed. Do not have throw rugs and other things on the floor that can make you trip. What can I do in the kitchen? Clean up any spills right away. Avoid walking on wet floors. Keep items that you use a lot in easy-to-reach places. If you need to reach something above you, use a strong step stool that has a grab bar. Keep electrical cords out of the way. Do not use floor polish or wax that makes floors slippery. If you must use wax, use non-skid floor wax. Do not have throw rugs and other things on the floor that can make you trip. What can I do with my stairs? Do not leave any items on the stairs. Make sure that there are handrails on both sides of the stairs and use them. Fix handrails that are broken or loose. Make sure that handrails are as long as the stairways. Check any carpeting to make sure that it is firmly attached to  the stairs. Fix any carpet that is loose or worn. Avoid having throw rugs at the top or bottom of the stairs. If you do have throw rugs, attach them to the floor with carpet tape. Make sure that you have a light switch at the top of the stairs and the bottom of the stairs. If you do not have them, ask someone to add them for you. What else can I do to help prevent falls? Wear shoes that: Do not have high heels. Have rubber bottoms. Are comfortable and fit you well. Are closed at the toe. Do not wear sandals. If you use a stepladder: Make sure that it is fully opened. Do not climb a closed stepladder. Make sure that both sides of the  stepladder are locked into place. Ask someone to hold it for you, if possible. Clearly mark and make sure that you can see: Any grab bars or handrails. First and last steps. Where the edge of each step is. Use tools that help you move around (mobility aids) if they are needed. These include: Canes. Walkers. Scooters. Crutches. Turn on the lights when you go into a dark area. Replace any light bulbs as soon as they burn out. Set up your furniture so you have a clear path. Avoid moving your furniture around. If any of your floors are uneven, fix them. If there are any pets around you, be aware of where they are. Review your medicines with your doctor. Some medicines can make you feel dizzy. This can increase your chance of falling. Ask your doctor what other things that you can do to help prevent falls. This information is not intended to replace advice given to you by your health care provider. Make sure you discuss any questions you have with your health care provider. Document Released: 01/20/2009 Document Revised: 09/01/2015 Document Reviewed: 04/30/2014 Elsevier Interactive Patient Education  2017 [employer].

## 2021-12-07 ENCOUNTER — Encounter: Payer: Self-pay | Admitting: Internal Medicine

## 2021-12-07 ENCOUNTER — Ambulatory Visit (INDEPENDENT_AMBULATORY_CARE_PROVIDER_SITE_OTHER): Payer: Medicare Other | Admitting: Internal Medicine

## 2021-12-07 VITALS — BP 122/70 | HR 77 | Temp 98.2°F | Ht 71.0 in | Wt 184.2 lb

## 2021-12-07 DIAGNOSIS — G8929 Other chronic pain: Secondary | ICD-10-CM

## 2021-12-07 DIAGNOSIS — E782 Mixed hyperlipidemia: Secondary | ICD-10-CM

## 2021-12-07 DIAGNOSIS — N320 Bladder-neck obstruction: Secondary | ICD-10-CM | POA: Diagnosis not present

## 2021-12-07 DIAGNOSIS — I4892 Unspecified atrial flutter: Secondary | ICD-10-CM

## 2021-12-07 DIAGNOSIS — J410 Simple chronic bronchitis: Secondary | ICD-10-CM | POA: Diagnosis not present

## 2021-12-07 DIAGNOSIS — S12100S Unspecified displaced fracture of second cervical vertebra, sequela: Secondary | ICD-10-CM | POA: Diagnosis not present

## 2021-12-07 DIAGNOSIS — W19XXXS Unspecified fall, sequela: Secondary | ICD-10-CM

## 2021-12-07 DIAGNOSIS — I6523 Occlusion and stenosis of bilateral carotid arteries: Secondary | ICD-10-CM | POA: Diagnosis not present

## 2021-12-07 DIAGNOSIS — T148XXA Other injury of unspecified body region, initial encounter: Secondary | ICD-10-CM | POA: Diagnosis not present

## 2021-12-07 DIAGNOSIS — M5440 Lumbago with sciatica, unspecified side: Secondary | ICD-10-CM | POA: Diagnosis not present

## 2021-12-07 DIAGNOSIS — S12110A Anterior displaced Type II dens fracture, initial encounter for closed fracture: Secondary | ICD-10-CM | POA: Insufficient documentation

## 2021-12-07 DIAGNOSIS — W19XXXA Unspecified fall, initial encounter: Secondary | ICD-10-CM | POA: Insufficient documentation

## 2021-12-07 LAB — URINALYSIS
Bilirubin Urine: NEGATIVE
Ketones, ur: NEGATIVE
Leukocytes,Ua: NEGATIVE
Nitrite: NEGATIVE
Specific Gravity, Urine: <=1.005 — AB (ref 1.000–1.030)
Total Protein, Urine: NEGATIVE
Urine Glucose: NEGATIVE
Urobilinogen, UA: 0.2 (ref 0.0–1.0)
pH: 6.5 (ref 5.0–8.0)

## 2021-12-07 LAB — LIPID PANEL
Cholesterol: 120 mg/dL (ref 0–200)
HDL: 37.6 mg/dL — ABNORMAL LOW (ref >39.00)
LDL Cholesterol: 54 mg/dL (ref 0–99)
NonHDL: 82.8
Total CHOL/HDL Ratio: 3
Triglycerides: 143 mg/dL (ref 0.0–149.0)
VLDL: 28.6 mg/dL (ref 0.0–40.0)

## 2021-12-07 LAB — CBC WITH DIFFERENTIAL/PLATELET
Basophils Absolute: 0.1 10*3/uL (ref 0.0–0.1)
Basophils Relative: 1 % (ref 0.0–3.0)
Eosinophils Absolute: 0.9 10*3/uL — ABNORMAL HIGH (ref 0.0–0.7)
Eosinophils Relative: 10.8 % — ABNORMAL HIGH (ref 0.0–5.0)
HCT: 40.2 % (ref 39.0–52.0)
Hemoglobin: 13.7 g/dL (ref 13.0–17.0)
Lymphocytes Relative: 16 % (ref 12.0–46.0)
Lymphs Abs: 1.3 10*3/uL (ref 0.7–4.0)
MCHC: 34.2 g/dL (ref 30.0–36.0)
MCV: 98.2 fl (ref 78.0–100.0)
Monocytes Absolute: 1 10*3/uL (ref 0.1–1.0)
Monocytes Relative: 12.4 % — ABNORMAL HIGH (ref 3.0–12.0)
Neutro Abs: 4.8 10*3/uL (ref 1.4–7.7)
Neutrophils Relative %: 59.8 % (ref 43.0–77.0)
Platelets: 236 10*3/uL (ref 150.0–400.0)
RBC: 4.1 Mil/uL — ABNORMAL LOW (ref 4.22–5.81)
RDW: 12.3 % (ref 11.5–15.5)
WBC: 8.1 10*3/uL (ref 4.0–10.5)

## 2021-12-07 LAB — COMPREHENSIVE METABOLIC PANEL
ALT: 14 U/L (ref 0–53)
AST: 15 U/L (ref 0–37)
Albumin: 4.3 g/dL (ref 3.5–5.2)
Alkaline Phosphatase: 70 U/L (ref 39–117)
BUN: 16 mg/dL (ref 6–23)
CO2: 31 mEq/L (ref 19–32)
Calcium: 9.7 mg/dL (ref 8.4–10.5)
Chloride: 98 mEq/L (ref 96–112)
Creatinine, Ser: 1.03 mg/dL (ref 0.40–1.50)
GFR: 67.45 mL/min (ref >60.00)
Glucose, Bld: 58 mg/dL — ABNORMAL LOW (ref 70–99)
Potassium: 4.5 mEq/L (ref 3.5–5.1)
Sodium: 136 mEq/L (ref 135–145)
Total Bilirubin: 0.6 mg/dL (ref 0.2–1.2)
Total Protein: 6.6 g/dL (ref 6.0–8.3)

## 2021-12-07 LAB — PSA: PSA: 0 ng/mL — ABNORMAL LOW (ref 0.10–4.00)

## 2021-12-07 LAB — TSH: TSH: 2.59 u[IU]/mL (ref 0.35–5.50)

## 2021-12-07 NOTE — Assessment & Plan Note (Signed)
Using a walker/cane Tylenol prn Tr[facility]ad[facility]  Potential benefits of a long term opioids use as well as potential risks (i.e. addiction risk, apnea etc) and complications (i.e. Somnolence, constipation and others) were explained to the patient and were aknowledged.

## 2021-12-07 NOTE — Assessment & Plan Note (Signed)
Fall prevention discussed

## 2021-12-07 NOTE — Progress Notes (Signed)
Subjective:  Patient ID: [name], male    DOB: 20-Mar-1941  Age: 81 y.o. MRN: [id number]  CC: Medication Management (F/U on meds)   HPI [name] presents for s/p mechanical fall and a hematoma to his forehead.  No other apparent injuries. He had a CT scan of his head and cervical spine.  There is no evidence of intracranial hemorrhage.  His cervical spine films show evidence of a nondisplaced type II odontoid fracture - Dr.Pool -  can be managed nonoperatively  He was placed in Aspen collar. Now has a soft collar on. F/u on LBP, COPD    Outpatient Medications Prior to Visit  Medication Sig Dispense Refill   aspirin EC 81 MG tablet Take 1 tablet (81 mg total) by mouth daily. 90 tablet 3   atorvastatin (LIPITOR) 40 MG tablet Take 1 tablet (40 mg total) by mouth at bedtime. Follow-up is due along w/ labs must see provider for future refills 90 tablet 0   clopidogrel (PLAVIX) 300 MG TABS tablet Take 1 tablet by mouth daily.     ELIQUIS 5 MG TABS tablet TAKE 1 TABLET TWICE A DAY 180 tablet 3   fluticasone (FLONASE) 50 MCG/ACT nasal spray USE 1 SPRAY IN EACH NOSTRIL DAILY AS NEEDED FOR ALLERGIES OR RHINITIS 48 g 0   folic acid (FOLVITE) 1 MG tablet TAKE 1 TABLET DAILY 90 tablet 3   furosemide (LASIX) 20 MG tablet TAKE 1 TABLET EVERY OTHER DAY 90 tablet 3   loratadine (CLARITIN) 10 MG tablet Take 10 mg by mouth daily.     LORazepam (ATIVAN) 0.5 MG tablet Take 0.5 mg by mouth daily as needed.     metoprolol succinate (TOPROL-XL) 50 MG 24 hr tablet Take 1 tablet (50 mg total) by mouth daily. 90 tablet 2   nitroGLYCERIN (NITROSTAT) 0.4 MG SL tablet Place 0.4 mg under the tongue as needed. Chest pain     pantoprazole (PROTONIX) 40 MG tablet Take 1 tablet (40 mg total) by mouth daily. Annual appt w/labs are due must see provider for future refills 30 tablet 0   potassium chloride (KLOR-CON) 8 MEQ tablet Take 1 tablet (8 mEq total) by mouth daily. Annual appt is due w/labs must  see provider for future refills 90 tablet 0   traMADol (ULTRAM) 50 MG tablet Take 1 tablet (50 mg total) by mouth every 6 (six) hours as needed for severe pain. Pain 120 tablet 1   zolpidem (AMBIEN) 5 MG tablet Take 1 tablet (5 mg total) by mouth at bedtime. 90 tablet 1   No facility-administered medications prior to visit.    ROS: Review of Systems  Constitutional:  Negative for appetite change, fatigue and unexpected weight change.  HENT:  Negative for congestion, nosebleeds, sneezing, sore throat and trouble swallowing.   Eyes:  Negative for itching and visual disturbance.  Respiratory:  Negative for cough.   Cardiovascular:  Negative for chest pain, palpitations and leg swelling.  Gastrointestinal:  Negative for abdominal distention, blood in stool, diarrhea and nausea.  Genitourinary:  Negative for frequency and hematuria.  Musculoskeletal:  Positive for arthralgias, back pain, gait problem, neck pain and neck stiffness. Negative for joint swelling.  Skin:  Negative for rash and wound.  Neurological:  Positive for weakness. Negative for dizziness, tremors and speech difficulty.  Hematological:  Bruises/bleeds easily.  Psychiatric/Behavioral:  Negative for agitation, dysphoric mood, sleep disturbance and suicidal ideas. The patient is not nervous/anxious.     Objective:  BP 122/70 (BP Location: Left Arm)   Pulse 77   Temp 98.2 F (36.8 C) (Oral)   Ht '5\' 11"'$  (1.803 m)   Wt 184 lb 3.2 oz (83.6 kg)   SpO2 95%   BMI 25.69 kg/m   BP Readings from Last 3 Encounters:  12/07/21 122/70  10/23/21 (!) 147/60  10/23/21 (!) 158/75    Wt Readings from Last 3 Encounters:  12/07/21 184 lb 3.2 oz (83.6 kg)  10/23/21 179 lb (81.2 kg)  10/23/21 179 lb (81.2 kg)    Physical Exam Constitutional:      General: He is not in acute distress.    Appearance: Normal appearance. He is well-developed.     Comments: NAD  Eyes:     Conjunctiva/sclera: Conjunctivae normal.     Pupils: Pupils  are equal, round, and reactive to light.  Neck:     Thyroid: No thyromegaly.     Vascular: No JVD.  Cardiovascular:     Rate and Rhythm: Normal rate and regular rhythm.     Heart sounds: Normal heart sounds. No murmur heard.    No friction rub. No gallop.  Pulmonary:     Effort: Pulmonary effort is normal. No respiratory distress.     Breath sounds: Normal breath sounds. No wheezing or rales.  Chest:     Chest wall: No tenderness.  Abdominal:     General: Bowel sounds are normal. There is no distension.     Palpations: Abdomen is soft. There is no mass.     Tenderness: There is no abdominal tenderness. There is no guarding or rebound.  Musculoskeletal:        General: Tenderness present. Normal range of motion.     Cervical back: Normal range of motion.     Right lower leg: No edema.     Left lower leg: No edema.  Lymphadenopathy:     Cervical: No cervical adenopathy.  Skin:    General: Skin is warm and dry.     Findings: No rash.  Neurological:     Mental Status: He is alert and oriented to person, place, and time.     Cranial Nerves: No cranial nerve deficit.     Motor: No abnormal muscle tone.     Coordination: Coordination normal.     Gait: Gait abnormal.     Deep Tendon Reflexes: Reflexes are normal and symmetric.  Psychiatric:        Behavior: Behavior normal.        Thought Content: Thought content normal.        Judgment: Judgment normal.   Neck in thesoft collar Using a cane  Lab Results  Component Value Date   WBC 8.6 02/01/2020   HGB 14.1 02/01/2020   HCT 41.1 02/01/2020   PLT 228.0 02/01/2020   GLUCOSE 69 (L) 08/02/2020   CHOL 107 08/02/2020   TRIG 156.0 (H) 08/02/2020   HDL 36.80 (L) 08/02/2020   LDLCALC 39 08/02/2020   ALT 11 08/02/2020   AST 15 08/02/2020   NA 138 08/02/2020   K 4.1 08/02/2020   CL 101 08/02/2020   CREATININE 1.01 08/02/2020   BUN 15 08/02/2020   CO2 30 08/02/2020   TSH 1.96 02/01/2020   PSA 0.00 (L) 02/01/2020   INR 1.30  05/28/2014   HGBA1C 5.7 07/11/2006    CT Head Wo Contrast  Result Date: 10/23/2021 CLINICAL DATA:  [place] and hit head EXAM: CT HEAD WITHOUT CONTRAST CT CERVICAL SPINE WITHOUT CONTRAST  TECHNIQUE: Multidetector CT imaging of the head and cervical spine was performed following the standard protocol without intravenous contrast. Multiplanar CT image reconstructions of the cervical spine were also generated. RADIATION DOSE REDUCTION: This exam was performed according to the departmental dose-optimization program which includes automated exposure control, adjustment of the mA and/or kV according to patient size and/or use of iterative reconstruction technique. COMPARISON:  None Available. FINDINGS: CT HEAD FINDINGS Brain: No acute territorial infarction, hemorrhage or intracranial mass. Mild atrophy. Minimal chronic small vessel ischemic change of the white matter. Nonenlarged ventricles Vascular: No hyperdense vessels.  Carotid vascular calcification Skull: Normal. Negative for fracture or focal lesion. Sinuses/Orbits: No acute finding. Other: None CT CERVICAL SPINE FINDINGS Alignment: No subluxation.  Facet alignment within normal limits. Skull base and vertebrae: Craniovertebral junction appears intact. Acute appearing nondisplaced type 2 odontoid fracture. Vertebral body heights are maintained Soft tissues and spinal canal: Mild prevertebral soft tissue enlargement at the level of fracture. No visible canal hematoma. Disc levels: Multilevel degenerative change. Mild disc space narrowing at C3-C4 and moderate disease at C5-C6 and C6-C7. Facet degenerative changes at multiple levels. Advanced C1-C2 degenerative change with partially calcified pannus. Upper chest: Negative. Other: None IMPRESSION: 1. No CT evidence for acute intracranial abnormality. Atrophy and mild chronic small vessel ischemic changes of the white matter 2. Acute nondisplaced type 2 odontoid fracture. Critical Value/emergent results were called  by telephone at the time of interpretation on 10/23/2021 at 7:22 pm to provider MELANIE BELFI , who verbally acknowledged these results. Electronically Signed   By: Donavan Foil M.D.   On: 10/23/2021 19:23   CT Cervical Spine Wo Contrast  Result Date: 10/23/2021 CLINICAL DATA:  [place] and hit head EXAM: CT HEAD WITHOUT CONTRAST CT CERVICAL SPINE WITHOUT CONTRAST TECHNIQUE: Multidetector CT imaging of the head and cervical spine was performed following the standard protocol without intravenous contrast. Multiplanar CT image reconstructions of the cervical spine were also generated. RADIATION DOSE REDUCTION: This exam was performed according to the departmental dose-optimization program which includes automated exposure control, adjustment of the mA and/or kV according to patient size and/or use of iterative reconstruction technique. COMPARISON:  None Available. FINDINGS: CT HEAD FINDINGS Brain: No acute territorial infarction, hemorrhage or intracranial mass. Mild atrophy. Minimal chronic small vessel ischemic change of the white matter. Nonenlarged ventricles Vascular: No hyperdense vessels.  Carotid vascular calcification Skull: Normal. Negative for fracture or focal lesion. Sinuses/Orbits: No acute finding. Other: None CT CERVICAL SPINE FINDINGS Alignment: No subluxation.  Facet alignment within normal limits. Skull base and vertebrae: Craniovertebral junction appears intact. Acute appearing nondisplaced type 2 odontoid fracture. Vertebral body heights are maintained Soft tissues and spinal canal: Mild prevertebral soft tissue enlargement at the level of fracture. No visible canal hematoma. Disc levels: Multilevel degenerative change. Mild disc space narrowing at C3-C4 and moderate disease at C5-C6 and C6-C7. Facet degenerative changes at multiple levels. Advanced C1-C2 degenerative change with partially calcified pannus. Upper chest: Negative. Other: None IMPRESSION: 1. No CT evidence for acute intracranial  abnormality. Atrophy and mild chronic small vessel ischemic changes of the white matter 2. Acute nondisplaced type 2 odontoid fracture. Critical Value/emergent results were called by telephone at the time of interpretation on 10/23/2021 at 7:22 pm to provider MELANIE BELFI , who verbally acknowledged these results. Electronically Signed   By: Donavan Foil M.D.   On: 10/23/2021 19:23    Assessment & Plan:   Problem List Items Addressed This Visit     Atrial flutter (  [place]) - Primary   Relevant Orders   TSH   Urinalysis   CBC with Differential/Platelet   Lipid panel   PSA   Comprehensive metabolic panel   Bruising   Relevant Orders   TSH   Urinalysis   COPD (chronic obstructive pulmonary disease) ([place])    Smoking less      Fall    Fall prevention discussed      Hyperlipidemia   Relevant Orders   TSH   CBC with Differential/Platelet   Lipid panel   Comprehensive metabolic panel   LOW BACK PAIN    Using a walker/cane Tylenol prn [facility]ad[facility]  Potential benefits of a long term opioids use as well as potential risks (i.e. addiction risk, apnea etc) and complications (i.e. Somnolence, constipation and others) were explained to the patient and were aknowledged.      Odontoid fracture ([facility])    S/p mechanical fall and a hematoma to his forehead.  No other apparent injuries. He had a CT scan of his head and cervical spine.  There is no evidence of intracranial hemorrhage.  His cervical spine films show evidence of a nondisplaced type II odontoid fracture - Dr.Pool -  can be managed nonoperatively  He was placed in Aspen collar. Now has a soft collar on      Other Visit Diagnoses     Bladder neck obstruction       Relevant Orders   PSA         No orders of the defined types were placed in this encounter.     Follow-up: Return in about 4 months (around 04/08/2022) for a follow-up visit.  Walker Kehr, MD

## 2021-12-07 NOTE — Assessment & Plan Note (Signed)
Smoking less 

## 2021-12-07 NOTE — Assessment & Plan Note (Signed)
S/p mechanical fall and a hematoma to his forehead.  No other apparent injuries. He had a CT scan of his head and cervical spine.  There is no evidence of intracranial hemorrhage.  His cervical spine films show evidence of a nondisplaced type II odontoid fracture - Dr.Pool -  can be managed nonoperatively  He was placed in Aspen collar. Now has a soft collar on

## 2021-12-20 ENCOUNTER — Other Ambulatory Visit: Payer: Medicare Other

## 2021-12-20 ENCOUNTER — Other Ambulatory Visit: Payer: Self-pay | Admitting: Internal Medicine

## 2021-12-20 ENCOUNTER — Encounter: Payer: Self-pay | Admitting: Internal Medicine

## 2021-12-20 ENCOUNTER — Ambulatory Visit (INDEPENDENT_AMBULATORY_CARE_PROVIDER_SITE_OTHER): Payer: Medicare Other | Admitting: Internal Medicine

## 2021-12-20 DIAGNOSIS — L03116 Cellulitis of left lower limb: Secondary | ICD-10-CM | POA: Diagnosis not present

## 2021-12-20 DIAGNOSIS — R609 Edema, unspecified: Secondary | ICD-10-CM | POA: Insufficient documentation

## 2021-12-20 DIAGNOSIS — I6523 Occlusion and stenosis of bilateral carotid arteries: Secondary | ICD-10-CM | POA: Diagnosis not present

## 2021-12-20 DIAGNOSIS — R600 Localized edema: Secondary | ICD-10-CM

## 2021-12-20 DIAGNOSIS — L0390 Cellulitis, unspecified: Secondary | ICD-10-CM | POA: Insufficient documentation

## 2021-12-20 LAB — D-DIMER, QUANTITATIVE: D-Dimer, Quant: 0.63 mcg/mL FEU — ABNORMAL HIGH (ref <0.50)

## 2021-12-20 MED ORDER — TORSEMIDE 20 MG PO TABS: 20.0000 mg | ORAL_TABLET | Freq: Every day | ORAL | 3 refills | Status: DC

## 2021-12-20 MED ORDER — DOXYCYCLINE HYCLATE 100 MG PO TABS: 100.0000 mg | ORAL_TABLET | Freq: Two times a day (BID) | ORAL | 0 refills | Status: DC

## 2021-12-20 MED ORDER — TORSEMIDE 20 MG PO TABS: 20.0000 mg | ORAL_TABLET | Freq: Every day | ORAL | 11 refills | Status: DC

## 2021-12-20 NOTE — Assessment & Plan Note (Signed)
LLE Treat edema and Cellulitis - Demadex, Doxy

## 2021-12-20 NOTE — Progress Notes (Signed)
Subjective:  Patient ID: [name], male    DOB: [birth year]  Age: 81 y.o. MRN: [id number]  CC: Leg Swelling ((L) leg/calf red and swollen, painful to walk x's 5-7 days )   HPI [name] presents for Leg Swelling ((L) leg/calf red and swollen, painful to walk x's 5-7 days ). No pain. No recent injury  Outpatient Medications Prior to Visit  Medication Sig Dispense Refill   aspirin EC 81 MG tablet Take 1 tablet (81 mg total) by mouth daily. 90 tablet 3   atorvastatin (LIPITOR) 40 MG tablet Take 1 tablet (40 mg total) by mouth at bedtime. Follow-up is due along w/ labs must see provider for future refills 90 tablet 0   clopidogrel (PLAVIX) 300 MG TABS tablet Take 1 tablet by mouth daily.     ELIQUIS 5 MG TABS tablet TAKE 1 TABLET TWICE A DAY 180 tablet 3   fluticasone (FLONASE) 50 MCG/ACT nasal spray USE 1 SPRAY IN EACH NOSTRIL DAILY AS NEEDED FOR ALLERGIES OR RHINITIS 48 g 0   folic acid (FOLVITE) 1 MG tablet TAKE 1 TABLET DAILY 90 tablet 3   loratadine (CLARITIN) 10 MG tablet Take 10 mg by mouth daily.     LORazepam (ATIVAN) 0.5 MG tablet Take 0.5 mg by mouth daily as needed.     metoprolol succinate (TOPROL-XL) 50 MG 24 hr tablet Take 1 tablet (50 mg total) by mouth daily. 90 tablet 2   nitroGLYCERIN (NITROSTAT) 0.4 MG SL tablet Place 0.4 mg under the tongue as needed. Chest pain     pantoprazole (PROTONIX) 40 MG tablet Take 1 tablet (40 mg total) by mouth daily. Annual appt w/labs are due must see provider for future refills 30 tablet 0   potassium chloride (KLOR-CON) 8 MEQ tablet Take 1 tablet (8 mEq total) by mouth daily. Annual appt is due w/labs must see provider for future refills 90 tablet 0   traMADol (ULTRAM) 50 MG tablet Take 1 tablet (50 mg total) by mouth every 6 (six) hours as needed for severe pain. Pain 120 tablet 1   zolpidem (AMBIEN) 5 MG tablet Take 1 tablet (5 mg total) by mouth at bedtime. 90 tablet 1   furosemide (LASIX) 20 MG tablet TAKE 1 TABLET  EVERY OTHER DAY 90 tablet 3   No facility-administered medications prior to visit.    ROS: Review of Systems  Constitutional:  Negative for appetite change, chills, fatigue, fever and unexpected weight change.  HENT:  Negative for congestion, nosebleeds, sneezing, sore throat and trouble swallowing.   Eyes:  Negative for itching and visual disturbance.  Respiratory:  Negative for cough.   Cardiovascular:  Positive for leg swelling. Negative for chest pain and palpitations.  Gastrointestinal:  Negative for abdominal distention, blood in stool, diarrhea and nausea.  Genitourinary:  Negative for frequency and hematuria.  Musculoskeletal:  Positive for back pain and gait problem. Negative for joint swelling and neck pain.  Skin:  Positive for color change, rash and wound.  Neurological:  Positive for weakness. Negative for dizziness, tremors and speech difficulty.  Hematological:  Bruises/bleeds easily.  Psychiatric/Behavioral:  Negative for agitation, dysphoric mood, sleep disturbance and suicidal ideas. The patient is not nervous/anxious.     Objective:  BP 120/72 (BP Location: Left Arm)   Pulse 73   Temp 98 F (36.7 C) (Oral)   Ht '5\' 11"'$  (1.803 m)   Wt 184 lb 6.4 oz (83.6 kg)   SpO2 97%   BMI 25.72  kg/m   BP Readings from Last 3 Encounters:  12/20/21 120/72  12/07/21 122/70  10/23/21 (!) 147/60    Wt Readings from Last 3 Encounters:  12/20/21 184 lb 6.4 oz (83.6 kg)  12/07/21 184 lb 3.2 oz (83.6 kg)  10/23/21 179 lb (81.2 kg)    Physical Exam Constitutional:      General: He is not in acute distress.    Appearance: Normal appearance. He is well-developed. He is not toxic-appearing.     Comments: NAD  Eyes:     Conjunctiva/sclera: Conjunctivae normal.     Pupils: Pupils are equal, round, and reactive to light.  Neck:     Thyroid: No thyromegaly.     Vascular: No JVD.  Cardiovascular:     Rate and Rhythm: Normal rate and regular rhythm.     Heart sounds: Normal  heart sounds. No murmur heard.    No friction rub. No gallop.  Pulmonary:     Effort: Pulmonary effort is normal. No respiratory distress.     Breath sounds: Normal breath sounds. No wheezing or rales.  Chest:     Chest wall: No tenderness.  Abdominal:     General: Bowel sounds are normal. There is no distension.     Palpations: Abdomen is soft. There is no mass.     Tenderness: There is no abdominal tenderness. There is no guarding or rebound.  Musculoskeletal:        General: Tenderness present. Normal range of motion.     Cervical back: Normal range of motion.     Right lower leg: Edema present.     Left lower leg: Edema present.  Lymphadenopathy:     Cervical: No cervical adenopathy.  Skin:    General: Skin is warm and dry.     Findings: No rash.  Neurological:     Mental Status: He is alert and oriented to person, place, and time.     Cranial Nerves: No cranial nerve deficit.     Motor: No abnormal muscle tone.     Coordination: Coordination normal.     Gait: Gait abnormal.     Deep Tendon Reflexes: Reflexes are normal and symmetric.  Psychiatric:        Behavior: Behavior normal.        Thought Content: Thought content normal.        Judgment: Judgment normal.    R ankle 1+ LLE 1-2+ up to the knee Eryth rash lower 2/3 of LLE, warm Using a cane   Lab Results  Component Value Date   WBC 8.1 12/07/2021   HGB 13.7 12/07/2021   HCT 40.2 12/07/2021   PLT 236.0 12/07/2021   GLUCOSE 58 (L) 12/07/2021   CHOL 120 12/07/2021   TRIG 143.0 12/07/2021   HDL 37.60 (L) 12/07/2021   LDLCALC 54 12/07/2021   ALT 14 12/07/2021   AST 15 12/07/2021   NA 136 12/07/2021   K 4.5 12/07/2021   CL 98 12/07/2021   CREATININE 1.03 12/07/2021   BUN 16 12/07/2021   CO2 31 12/07/2021   TSH 2.59 12/07/2021   PSA 0.00 (L) 12/07/2021   INR 1.30 05/28/2014   HGBA1C 5.7 07/11/2006    CT Head Wo Contrast  Result Date: 10/23/2021 CLINICAL DATA:  [place] and hit head EXAM: CT HEAD  WITHOUT CONTRAST CT CERVICAL SPINE WITHOUT CONTRAST TECHNIQUE: Multidetector CT imaging of the head and cervical spine was performed following the standard protocol without intravenous contrast. Multiplanar CT image reconstructions of  the cervical spine were also generated. RADIATION DOSE REDUCTION: This exam was performed according to the departmental dose-optimization program which includes automated exposure control, adjustment of the mA and/or kV according to patient size and/or use of iterative reconstruction technique. COMPARISON:  None Available. FINDINGS: CT HEAD FINDINGS Brain: No acute territorial infarction, hemorrhage or intracranial mass. Mild atrophy. Minimal chronic small vessel ischemic change of the white matter. Nonenlarged ventricles Vascular: No hyperdense vessels.  Carotid vascular calcification Skull: Normal. Negative for fracture or focal lesion. Sinuses/Orbits: No acute finding. Other: None CT CERVICAL SPINE FINDINGS Alignment: No subluxation.  Facet alignment within normal limits. Skull base and vertebrae: Craniovertebral junction appears intact. Acute appearing nondisplaced type 2 odontoid fracture. Vertebral body heights are maintained Soft tissues and spinal canal: Mild prevertebral soft tissue enlargement at the level of fracture. No visible canal hematoma. Disc levels: Multilevel degenerative change. Mild disc space narrowing at C3-C4 and moderate disease at C5-C6 and C6-C7. Facet degenerative changes at multiple levels. Advanced C1-C2 degenerative change with partially calcified pannus. Upper chest: Negative. Other: None IMPRESSION: 1. No CT evidence for acute intracranial abnormality. Atrophy and mild chronic small vessel ischemic changes of the white matter 2. Acute nondisplaced type 2 odontoid fracture. Critical Value/emergent results were called by telephone at the time of interpretation on 10/23/2021 at 7:22 pm to provider MELANIE BELFI , who verbally acknowledged these results.  Electronically Signed   By: Donavan Foil M.D.   On: 10/23/2021 19:23   CT Cervical Spine Wo Contrast  Result Date: 10/23/2021 CLINICAL DATA:  [place] and hit head EXAM: CT HEAD WITHOUT CONTRAST CT CERVICAL SPINE WITHOUT CONTRAST TECHNIQUE: Multidetector CT imaging of the head and cervical spine was performed following the standard protocol without intravenous contrast. Multiplanar CT image reconstructions of the cervical spine were also generated. RADIATION DOSE REDUCTION: This exam was performed according to the departmental dose-optimization program which includes automated exposure control, adjustment of the mA and/or kV according to patient size and/or use of iterative reconstruction technique. COMPARISON:  None Available. FINDINGS: CT HEAD FINDINGS Brain: No acute territorial infarction, hemorrhage or intracranial mass. Mild atrophy. Minimal chronic small vessel ischemic change of the white matter. Nonenlarged ventricles Vascular: No hyperdense vessels.  Carotid vascular calcification Skull: Normal. Negative for fracture or focal lesion. Sinuses/Orbits: No acute finding. Other: None CT CERVICAL SPINE FINDINGS Alignment: No subluxation.  Facet alignment within normal limits. Skull base and vertebrae: Craniovertebral junction appears intact. Acute appearing nondisplaced type 2 odontoid fracture. Vertebral body heights are maintained Soft tissues and spinal canal: Mild prevertebral soft tissue enlargement at the level of fracture. No visible canal hematoma. Disc levels: Multilevel degenerative change. Mild disc space narrowing at C3-C4 and moderate disease at C5-C6 and C6-C7. Facet degenerative changes at multiple levels. Advanced C1-C2 degenerative change with partially calcified pannus. Upper chest: Negative. Other: None IMPRESSION: 1. No CT evidence for acute intracranial abnormality. Atrophy and mild chronic small vessel ischemic changes of the white matter 2. Acute nondisplaced type 2 odontoid fracture.  Critical Value/emergent results were called by telephone at the time of interpretation on 10/23/2021 at 7:22 pm to provider MELANIE BELFI , who verbally acknowledged these results. Electronically Signed   By: Donavan Foil M.D.   On: 10/23/2021 19:23    Assessment & Plan:   Problem List Items Addressed This Visit     Cellulitis    LLE Treat edema and Cellulitis - Demadex, Doxy      Edema    New L>>R LE worse On  Eliquis Check D dimer Treat edema and Cellulitis - Demadex, Doxy         Meds ordered this encounter  Medications   torsemide (DEMADEX) 20 MG tablet    Sig: Take 1-2 tablets (20-40 mg total) by mouth daily.    Dispense:  60 tablet    Refill:  11   doxycycline (VIBRA-TABS) 100 MG tablet    Sig: Take 1 tablet (100 mg total) by mouth 2 (two) times daily.    Dispense:  28 tablet    Refill:  0      Follow-up: Return in about 2 weeks (around 01/03/2022) for a follow-up visit.  Walker Kehr, MD

## 2021-12-20 NOTE — Patient Instructions (Signed)
Stop Furosemide Take Torsemide instead Take Doxycucline

## 2021-12-20 NOTE — Assessment & Plan Note (Addendum)
New L>>R LE worse On Eliquis Check D dimer Treat edema and Cellulitis - Demadex, Doxy

## 2021-12-21 ENCOUNTER — Other Ambulatory Visit: Payer: Self-pay | Admitting: Internal Medicine

## 2021-12-21 DIAGNOSIS — R600 Localized edema: Secondary | ICD-10-CM

## 2022-01-05 ENCOUNTER — Ambulatory Visit (HOSPITAL_COMMUNITY)
Admission: RE | Admit: 2022-01-05 | Discharge: 2022-01-05 | Disposition: A | Discharge status: Home / Self Care | Payer: Medicare Other | Source: Ambulatory Visit | Attending: Internal Medicine | Admitting: Internal Medicine

## 2022-01-05 DIAGNOSIS — R600 Localized edema: Secondary | ICD-10-CM | POA: Diagnosis not present

## 2022-01-05 NOTE — Progress Notes (Signed)
Left lower extremity venous duplex completed. Refer to "CV Proc" under chart review to view preliminary results.  01/05/2022 2:25 PM Kelby Aline., MHA, RVT, RDCS, RDMS

## 2022-01-17 DIAGNOSIS — S12112A Nondisplaced Type II dens fracture, initial encounter for closed fracture: Secondary | ICD-10-CM | POA: Diagnosis not present

## 2022-01-29 ENCOUNTER — Telehealth: Payer: Self-pay | Admitting: Internal Medicine

## 2022-01-29 MED ORDER — ATORVASTATIN CALCIUM 40 MG PO TABS: 40.0000 mg | ORAL_TABLET | Freq: Every day | ORAL | 3 refills | Status: DC

## 2022-01-29 NOTE — Telephone Encounter (Signed)
Patient needs Lipitor tablets 40 mg. Refilled - Please send to [facility]  Last Visit:  12/21/2021  Next Visit:  091/05/2022

## 2022-01-29 NOTE — Telephone Encounter (Signed)
Sent rx to mail order.Marland KitchenJohny Chess

## 2022-02-10 ENCOUNTER — Other Ambulatory Visit: Payer: Self-pay | Admitting: Internal Medicine

## 2022-02-13 MED ORDER — PANTOPRAZOLE SODIUM 40 MG PO TBEC: 40.0000 mg | DELAYED_RELEASE_TABLET | Freq: Every day | ORAL | 3 refills | Status: DC

## 2022-02-13 NOTE — Addendum Note (Signed)
Addended by: Earnstine Regal on: 02/13/2022 12:04 PM   Modules accepted: Orders

## 2022-02-28 ENCOUNTER — Other Ambulatory Visit: Payer: Self-pay | Admitting: Internal Medicine

## 2022-03-16 ENCOUNTER — Other Ambulatory Visit: Payer: Self-pay | Admitting: *Deleted

## 2022-03-16 MED ORDER — FLUTICASONE PROPIONATE 50 MCG/ACT NA SUSP
1.0000 | Freq: Every day | NASAL | 0 refills | Status: DC | PRN
Start: 2022-03-16 — End: 2022-09-11

## 2022-04-10 ENCOUNTER — Encounter: Payer: Self-pay | Admitting: Internal Medicine

## 2022-04-10 ENCOUNTER — Ambulatory Visit (INDEPENDENT_AMBULATORY_CARE_PROVIDER_SITE_OTHER): Payer: Medicare Other | Admitting: Internal Medicine

## 2022-04-10 VITALS — BP 128/76 | HR 59 | Temp 98.3°F | Ht 71.0 in | Wt 181.0 lb

## 2022-04-10 DIAGNOSIS — I4892 Unspecified atrial flutter: Secondary | ICD-10-CM | POA: Diagnosis not present

## 2022-04-10 DIAGNOSIS — H9319 Tinnitus, unspecified ear: Secondary | ICD-10-CM | POA: Insufficient documentation

## 2022-04-10 DIAGNOSIS — G8929 Other chronic pain: Secondary | ICD-10-CM

## 2022-04-10 DIAGNOSIS — S199XXA Unspecified injury of neck, initial encounter: Secondary | ICD-10-CM

## 2022-04-10 DIAGNOSIS — F17210 Nicotine dependence, cigarettes, uncomplicated: Secondary | ICD-10-CM

## 2022-04-10 DIAGNOSIS — M5440 Lumbago with sciatica, unspecified side: Secondary | ICD-10-CM

## 2022-04-10 DIAGNOSIS — H905 Unspecified sensorineural hearing loss: Secondary | ICD-10-CM | POA: Insufficient documentation

## 2022-04-10 LAB — CBC WITH DIFFERENTIAL/PLATELET
Basophils Absolute: 0.1 10*3/uL (ref 0.0–0.1)
Basophils Relative: 1.2 % (ref 0.0–3.0)
Eosinophils Absolute: 1 10*3/uL — ABNORMAL HIGH (ref 0.0–0.7)
Eosinophils Relative: 10.7 % — ABNORMAL HIGH (ref 0.0–5.0)
HCT: 38.6 % — ABNORMAL LOW (ref 39.0–52.0)
Hemoglobin: 13.5 g/dL (ref 13.0–17.0)
Lymphocytes Relative: 16.8 % (ref 12.0–46.0)
Lymphs Abs: 1.5 10*3/uL (ref 0.7–4.0)
MCHC: 34.9 g/dL (ref 30.0–36.0)
MCV: 98 fl (ref 78.0–100.0)
Monocytes Absolute: 0.9 10*3/uL (ref 0.1–1.0)
Monocytes Relative: 10.2 % (ref 3.0–12.0)
Neutro Abs: 5.5 10*3/uL (ref 1.4–7.7)
Neutrophils Relative %: 61.1 % (ref 43.0–77.0)
Platelets: 251 10*3/uL (ref 150.0–400.0)
RBC: 3.94 Mil/uL — ABNORMAL LOW (ref 4.22–5.81)
RDW: 13.1 % (ref 11.5–15.5)
WBC: 9 10*3/uL (ref 4.0–10.5)

## 2022-04-10 LAB — COMPREHENSIVE METABOLIC PANEL
ALT: 14 U/L (ref 0–53)
AST: 15 U/L (ref 0–37)
Albumin: 4.2 g/dL (ref 3.5–5.2)
Alkaline Phosphatase: 61 U/L (ref 39–117)
BUN: 18 mg/dL (ref 6–23)
CO2: 30 mEq/L (ref 19–32)
Calcium: 9.5 mg/dL (ref 8.4–10.5)
Chloride: 100 mEq/L (ref 96–112)
Creatinine, Ser: 1.08 mg/dL (ref 0.40–1.50)
GFR: 63.57 mL/min (ref >60.00)
Glucose, Bld: 92 mg/dL (ref 70–99)
Potassium: 3.8 mEq/L (ref 3.5–5.1)
Sodium: 139 mEq/L (ref 135–145)
Total Bilirubin: 0.5 mg/dL (ref 0.2–1.2)
Total Protein: 6.6 g/dL (ref 6.0–8.3)

## 2022-04-10 MED ORDER — TRAMADOL HCL 50 MG PO TABS: 50.0000 mg | ORAL_TABLET | Freq: Four times a day (QID) | ORAL | 1 refills | Status: DC | PRN

## 2022-04-10 NOTE — Assessment & Plan Note (Addendum)
On Eliquis

## 2022-04-10 NOTE — Progress Notes (Signed)
Subjective:  Patient ID: [name], male    DOB: Aug 05, 1940  Age: 82 y.o. MRN: [id number]  CC: Follow-up   HPI [name] presents for LBP, COPD, anticoagulation  Outpatient Medications Prior to Visit  Medication Sig Dispense Refill   aspirin EC 81 MG tablet Take 1 tablet (81 mg total) by mouth daily. 90 tablet 3   atorvastatin (LIPITOR) 40 MG tablet Take 1 tablet (40 mg total) by mouth at bedtime. 90 tablet 3   clopidogrel (PLAVIX) 300 MG TABS tablet Take 1 tablet by mouth daily.     doxycycline (VIBRA-TABS) 100 MG tablet Take 1 tablet (100 mg total) by mouth 2 (two) times daily. 28 tablet 0   ELIQUIS 5 MG TABS tablet TAKE 1 TABLET TWICE A DAY 180 tablet 3   fluticasone (FLONASE) 50 MCG/ACT nasal spray Place 1 spray into both nostrils daily as needed for allergies or rhinitis. 48 g 0   folic acid (FOLVITE) 1 MG tablet TAKE 1 TABLET DAILY 90 tablet 3   KLOR-CON 8 MEQ tablet TAKE 1 TABLET DAILY (ANNUAL APPOINTMENT DUE WITH LABS, MUST SEE PROVIDER FOR FUTURE REFILLS) 90 tablet 3   loratadine (CLARITIN) 10 MG tablet Take 10 mg by mouth daily.     LORazepam (ATIVAN) 0.5 MG tablet Take 0.5 mg by mouth daily as needed.     metoprolol succinate (TOPROL-XL) 50 MG 24 hr tablet Take 1 tablet (50 mg total) by mouth daily. 90 tablet 2   nitroGLYCERIN (NITROSTAT) 0.4 MG SL tablet Place 0.4 mg under the tongue as needed. Chest pain     pantoprazole (PROTONIX) 40 MG tablet Take 1 tablet (40 mg total) by mouth daily. 90 tablet 3   torsemide (DEMADEX) 20 MG tablet Take 1-2 tablets (20-40 mg total) by mouth daily. 180 tablet 3   traZODone (DESYREL) 50 MG tablet Take 1 tablet by mouth at bedtime.     zolpidem (AMBIEN) 5 MG tablet Take 1 tablet (5 mg total) by mouth at bedtime. 90 tablet 1   traMADol (ULTRAM) 50 MG tablet Take 1 tablet (50 mg total) by mouth every 6 (six) hours as needed for severe pain. Pain 120 tablet 1   No facility-administered medications prior to visit.     ROS: Review of Systems  Constitutional:  Negative for appetite change, fatigue and unexpected weight change.  HENT:  Negative for congestion, nosebleeds, sneezing, sore throat and trouble swallowing.   Eyes:  Negative for itching and visual disturbance.  Respiratory:  Negative for cough.   Cardiovascular:  Negative for chest pain, palpitations and leg swelling.  Gastrointestinal:  Negative for abdominal distention, blood in stool, diarrhea and nausea.  Genitourinary:  Negative for frequency and hematuria.  Musculoskeletal:  Positive for arthralgias, back pain, gait problem, neck pain and neck stiffness. Negative for joint swelling.  Skin:  Negative for rash.  Neurological:  Negative for dizziness, tremors, speech difficulty and weakness.  Hematological:  Bruises/bleeds easily.  Psychiatric/Behavioral:  Negative for agitation, dysphoric mood and sleep disturbance. The patient is not nervous/anxious.     Objective:  BP 128/76 (BP Location: Right Arm, Patient Position: Sitting, Cuff Size: Normal)   Pulse (!) 59   Temp 98.3 F (36.8 C) (Oral)   Ht '5\' 11"'$  (1.803 m)   Wt 181 lb (82.1 kg)   SpO2 96%   BMI 25.24 kg/m   BP Readings from Last 3 Encounters:  04/10/22 128/76  12/20/21 120/72  12/07/21 122/70    Wt Readings  from Last 3 Encounters:  04/10/22 181 lb (82.1 kg)  12/20/21 184 lb 6.4 oz (83.6 kg)  12/07/21 184 lb 3.2 oz (83.6 kg)    Physical Exam Constitutional:      General: He is not in acute distress.    Appearance: Normal appearance. He is well-developed.     Comments: NAD  Eyes:     Conjunctiva/sclera: Conjunctivae normal.     Pupils: Pupils are equal, round, and reactive to light.  Neck:     Thyroid: No thyromegaly.     Vascular: No JVD.  Cardiovascular:     Rate and Rhythm: Normal rate and regular rhythm.     Heart sounds: Normal heart sounds. No murmur heard.    No friction rub. No gallop.  Pulmonary:     Effort: Pulmonary effort is normal. No  respiratory distress.     Breath sounds: Normal breath sounds. No wheezing or rales.  Chest:     Chest wall: No tenderness.  Abdominal:     General: Bowel sounds are normal. There is no distension.     Palpations: Abdomen is soft. There is no mass.     Tenderness: There is no abdominal tenderness. There is no guarding or rebound.  Musculoskeletal:        General: Tenderness present. Normal range of motion.     Cervical back: Normal range of motion.  Lymphadenopathy:     Cervical: No cervical adenopathy.  Skin:    General: Skin is warm and dry.     Findings: No rash.  Neurological:     Mental Status: He is alert and oriented to person, place, and time.     Cranial Nerves: No cranial nerve deficit.     Motor: No abnormal muscle tone.     Coordination: Coordination normal.     Gait: Gait abnormal.     Deep Tendon Reflexes: Reflexes are normal and symmetric.  Psychiatric:        Behavior: Behavior normal.        Thought Content: Thought content normal.        Judgment: Judgment normal.     Lab Results  Component Value Date   WBC 8.1 12/07/2021   HGB 13.7 12/07/2021   HCT 40.2 12/07/2021   PLT 236.0 12/07/2021   GLUCOSE 58 (L) 12/07/2021   CHOL 120 12/07/2021   TRIG 143.0 12/07/2021   HDL 37.60 (L) 12/07/2021   LDLCALC 54 12/07/2021   ALT 14 12/07/2021   AST 15 12/07/2021   NA 136 12/07/2021   K 4.5 12/07/2021   CL 98 12/07/2021   CREATININE 1.03 12/07/2021   BUN 16 12/07/2021   CO2 31 12/07/2021   TSH 2.59 12/07/2021   PSA 0.00 (L) 12/07/2021   INR 1.30 05/28/2014   HGBA1C 5.7 07/11/2006    VAS [place] LOWER EXTREMITY VENOUS (DVT)  Result Date: 01/05/2022  Lower Venous DVT Study Patient Name:  [name]  Date of Exam:   01/05/2022 Medical Rec #: [id number]             Accession #:    [id number] Date of Birth: 1940/12/02             Patient Gender: M Patient Age:   1 years Exam Location:  [facility] Procedure:      VAS [place] LOWER EXTREMITY VENOUS (DVT)  Referring Phys: Tyrone Apple Mariha Sleeper --------------------------------------------------------------------------------  Indications: Elevated d-dimer 0.37mg/mL.  Limitations: Poor ultrasound/tissue interface. Comparison Study: No prior study Performing Technologist:  Michelle Simonetti MHA, RDMS, RVT, RDCS  Examination Guidelines: A complete evaluation includes B-mode imaging, spectral Doppler, color Doppler, and power Doppler as needed of all accessible portions of each vessel. Bilateral testing is considered an integral part of a complete examination. Limited examinations for reoccurring indications may be performed as noted. The reflux portion of the exam is performed with the patient in reverse Trendelenburg.  +-----+---------------+---------+-----------+----------+--------------+ RIGHTCompressibilityPhasicitySpontaneityPropertiesThrombus Aging +-----+---------------+---------+-----------+----------+--------------+ CFV  Full           Yes      Yes                                 +-----+---------------+---------+-----------+----------+--------------+   +---------+---------------+---------+-----------+----------+--------------+ LEFT     CompressibilityPhasicitySpontaneityPropertiesThrombus Aging +---------+---------------+---------+-----------+----------+--------------+ CFV      Full           Yes      Yes                                 +---------+---------------+---------+-----------+----------+--------------+ SFJ      Full                                                        +---------+---------------+---------+-----------+----------+--------------+ FV Prox  Full                                                        +---------+---------------+---------+-----------+----------+--------------+ FV Mid   Full                                                        +---------+---------------+---------+-----------+----------+--------------+ FV DistalFull                                                         +---------+---------------+---------+-----------+----------+--------------+ PFV      Full                                                        +---------+---------------+---------+-----------+----------+--------------+ POP      Full           Yes      Yes                                 +---------+---------------+---------+-----------+----------+--------------+ PTV      Full                                                        +---------+---------------+---------+-----------+----------+--------------+  Left Technical Findings: Not visualized segments include unable to adequately visualize left peroneal veins.   Summary: RIGHT: - No evidence of common femoral vein obstruction.  LEFT: - There is no evidence of deep vein thrombosis in the lower extremity. However, portions of this examination were limited- see technologist comments above.  - No cystic structure found in the popliteal fossa.  *See table(s) above for measurements and observations. Electronically signed by Deitra Mayo MD on 01/05/2022 at 2:51:17 PM.    Final     Assessment & Plan:   Problem List Items Addressed This Visit     Nicotine dependence    Unchanged      LOW BACK PAIN - Primary    Worse Blue-Emu cream - use 2-3 times a day Tramadol: try 75 or 100 mg qid prn      Relevant Medications   traMADol (ULTRAM) 50 MG tablet   Other Relevant Orders   Comprehensive metabolic panel   CBC with Differential/Platelet   Injury of neck    Remote Worsening pain Blue-Emu cream - use 2-3 times a day Tramadol: try 75 or 100 mg qid prn      Atrial flutter (HCC)    On Eliquis      Relevant Orders   Comprehensive metabolic panel   CBC with Differential/Platelet      Meds ordered this encounter  Medications   traMADol (ULTRAM) 50 MG tablet    Sig: Take 1-2 tablets (50-100 mg total) by mouth every 6 (six) hours as needed for severe pain. Pain     Dispense:  240 tablet    Refill:  1      Follow-up: Return in about 4 months (around 08/09/2022) for a follow-up visit.  Walker Kehr, MD

## 2022-04-10 NOTE — Assessment & Plan Note (Signed)
Worse Blue-Emu cream - use 2-3 times a day Tramadol: try 75 or 100 mg qid prn

## 2022-04-10 NOTE — Assessment & Plan Note (Signed)
Unchanged

## 2022-04-10 NOTE — Assessment & Plan Note (Signed)
Remote Worsening pain Blue-Emu cream - use 2-3 times a day Tramadol: try 75 or 100 mg qid prn

## 2022-04-10 NOTE — Patient Instructions (Signed)
Blue-Emu cream -- use 2-3 times a day ? ?

## 2022-04-11 ENCOUNTER — Telehealth: Payer: Self-pay | Admitting: Internal Medicine

## 2022-04-11 NOTE — Telephone Encounter (Signed)
[facility]ed [facility] spoke w/ Shanon Brow he wanted to verify the last time pt had filled. He states they follow the CDC guideline for opioids can only send 7 day first and then if he have to take 8 a day MD  need to specify how many pt takes a day on script../l,mb

## 2022-04-11 NOTE — Telephone Encounter (Signed)
Adam from [facility] called concerning patients rx for tramadol.  They need to discuss dosage, possible drug interactions - Please call  [phone]   Ref:  [id number]

## 2022-04-11 NOTE — Telephone Encounter (Signed)
[name] was getting tramadol regular.  Do I need to send a new prescription for 7-day supply 2 tablets 4 times a day?  Thank you

## 2022-04-12 MED ORDER — TRAMADOL HCL 50 MG PO TABS
50.0000 mg | ORAL_TABLET | Freq: Four times a day (QID) | ORAL | 0 refills | Status: DC | PRN
Start: 2022-04-12 — End: 2022-09-11

## 2022-04-12 NOTE — Telephone Encounter (Signed)
[facility] spoke w/ Haynes Dage pharmacist she states pt hasn't rx tramadol since 2019 w/ them. The max dose is 4 a day. Due to age 82 tablets a day is too much. She states they don't want to over dispense and pt may not be able to take. They are recommending max of 4 tabs a day.Marland KitchenJohny Chess

## 2022-04-12 NOTE — Telephone Encounter (Signed)
I changed his Rx.We can send a larger Rx in 7 days Thx

## 2022-04-16 ENCOUNTER — Telehealth: Payer: Self-pay | Admitting: *Deleted

## 2022-04-16 NOTE — Telephone Encounter (Signed)
Rec'd fax Atirvastatin 40 mg are unavailable. The [name] name lipitor is not an option. Please indicate alternative of your choice by choosing another med.Marland KitchenJohny [name]

## 2022-04-18 ENCOUNTER — Telehealth: Payer: Self-pay | Admitting: Internal Medicine

## 2022-04-18 MED ORDER — ATORVASTATIN CALCIUM 20 MG PO TABS: 20.0000 mg | ORAL_TABLET | Freq: Every day | ORAL | 3 refills | Status: DC

## 2022-04-18 NOTE — Telephone Encounter (Signed)
Anne from [facility] called to follow up on a script for a lower back brace. Call back is [phone]

## 2022-04-18 NOTE — Telephone Encounter (Signed)
Have receive forms waiting on MD sign.Marland KitchenJohny Chess

## 2022-04-18 NOTE — Telephone Encounter (Signed)
Okay atorvastatin 20 mg.  Thanks

## 2022-04-23 NOTE — Telephone Encounter (Signed)
Never gave form back.Marland KitchenJohny [name]

## 2022-04-24 NOTE — Telephone Encounter (Signed)
OK w/me Thx 

## 2022-04-25 NOTE — Telephone Encounter (Signed)
Called [facility] supply spoke w/ Aaron Edelman. Inform can't take the verbal but will fax over the script again to [phone]. Inform him to put Att. Sadae Arrazola and I will make sure he gets it.Marland KitchenJohny Chess

## 2022-04-25 NOTE — Telephone Encounter (Signed)
Called Anne no answer & can't leave msg w/no vm.Marland KitchenJohny Chess

## 2022-04-30 NOTE — Telephone Encounter (Signed)
They called to follow up and I couldn't see where we had received so I requested them to fax again

## 2022-05-02 ENCOUNTER — Ambulatory Visit (HOSPITAL_BASED_OUTPATIENT_CLINIC_OR_DEPARTMENT_OTHER)
Admission: RE | Admit: 2022-05-02 | Discharge: 2022-05-02 | Disposition: A | Discharge status: Home / Self Care | Payer: Medicare Other | Source: Ambulatory Visit | Attending: Cardiovascular Disease | Admitting: Cardiovascular Disease

## 2022-05-02 ENCOUNTER — Encounter (HOSPITAL_COMMUNITY): Payer: Medicare Other

## 2022-05-02 ENCOUNTER — Ambulatory Visit (HOSPITAL_COMMUNITY)
Admission: RE | Admit: 2022-05-02 | Discharge: 2022-05-02 | Disposition: A | Discharge status: Home / Self Care | Payer: Medicare Other | Source: Ambulatory Visit | Attending: Cardiovascular Disease | Admitting: Cardiovascular Disease

## 2022-05-02 DIAGNOSIS — I708 Atherosclerosis of other arteries: Secondary | ICD-10-CM | POA: Diagnosis not present

## 2022-05-02 DIAGNOSIS — I739 Peripheral vascular disease, unspecified: Secondary | ICD-10-CM

## 2022-05-02 DIAGNOSIS — Z9862 Peripheral vascular angioplasty status: Secondary | ICD-10-CM

## 2022-05-02 NOTE — Telephone Encounter (Signed)
Finally rec'd form Placed on MD desk to sign.Marland KitchenJohny Chess

## 2022-05-03 LAB — VAS US ABI WITH/WO TBI
Left ABI: 0.95
Right ABI: 0.94

## 2022-05-04 NOTE — Telephone Encounter (Signed)
Okay.  Thanks.

## 2022-05-22 DIAGNOSIS — H35033 Hypertensive retinopathy, bilateral: Secondary | ICD-10-CM | POA: Diagnosis not present

## 2022-05-22 DIAGNOSIS — H33102 Unspecified retinoschisis, left eye: Secondary | ICD-10-CM | POA: Diagnosis not present

## 2022-05-22 DIAGNOSIS — H35371 Puckering of macula, right eye: Secondary | ICD-10-CM | POA: Diagnosis not present

## 2022-05-22 DIAGNOSIS — Z961 Presence of intraocular lens: Secondary | ICD-10-CM | POA: Diagnosis not present

## 2022-05-22 DIAGNOSIS — H2512 Age-related nuclear cataract, left eye: Secondary | ICD-10-CM | POA: Diagnosis not present

## 2022-06-18 ENCOUNTER — Other Ambulatory Visit: Payer: Self-pay | Admitting: Cardiovascular Disease

## 2022-06-27 ENCOUNTER — Other Ambulatory Visit: Payer: Self-pay | Admitting: Internal Medicine

## 2022-07-05 ENCOUNTER — Other Ambulatory Visit: Payer: Self-pay | Admitting: Cardiovascular Disease

## 2022-08-15 DIAGNOSIS — H35371 Puckering of macula, right eye: Secondary | ICD-10-CM | POA: Diagnosis not present

## 2022-08-15 DIAGNOSIS — H2512 Age-related nuclear cataract, left eye: Secondary | ICD-10-CM | POA: Diagnosis not present

## 2022-08-15 DIAGNOSIS — Z961 Presence of intraocular lens: Secondary | ICD-10-CM | POA: Diagnosis not present

## 2022-08-31 ENCOUNTER — Other Ambulatory Visit: Payer: Self-pay

## 2022-08-31 ENCOUNTER — Emergency Department (HOSPITAL_COMMUNITY): Payer: Medicare Other

## 2022-08-31 ENCOUNTER — Inpatient Hospital Stay (HOSPITAL_COMMUNITY)
Admission: EM | Admit: 2022-08-31 | Discharge: 2022-10-08 | DRG: 177 | Disposition: E | Payer: Medicare Other | Attending: Internal Medicine | Admitting: Internal Medicine

## 2022-08-31 ENCOUNTER — Encounter (HOSPITAL_COMMUNITY): Payer: Self-pay | Admitting: Emergency Medicine

## 2022-08-31 DIAGNOSIS — R0689 Other abnormalities of breathing: Secondary | ICD-10-CM | POA: Diagnosis not present

## 2022-08-31 DIAGNOSIS — Z515 Encounter for palliative care: Secondary | ICD-10-CM

## 2022-08-31 DIAGNOSIS — S12100S Unspecified displaced fracture of second cervical vertebra, sequela: Secondary | ICD-10-CM | POA: Diagnosis not present

## 2022-08-31 DIAGNOSIS — Z7982 Long term (current) use of aspirin: Secondary | ICD-10-CM

## 2022-08-31 DIAGNOSIS — A419 Sepsis, unspecified organism: Secondary | ICD-10-CM | POA: Diagnosis not present

## 2022-08-31 DIAGNOSIS — Z885 Allergy status to narcotic agent status: Secondary | ICD-10-CM

## 2022-08-31 DIAGNOSIS — R918 Other nonspecific abnormal finding of lung field: Secondary | ICD-10-CM | POA: Diagnosis not present

## 2022-08-31 DIAGNOSIS — J439 Emphysema, unspecified: Secondary | ICD-10-CM | POA: Diagnosis present

## 2022-08-31 DIAGNOSIS — R4182 Altered mental status, unspecified: Secondary | ICD-10-CM | POA: Diagnosis not present

## 2022-08-31 DIAGNOSIS — I700 Atherosclerosis of aorta: Secondary | ICD-10-CM | POA: Diagnosis present

## 2022-08-31 DIAGNOSIS — C61 Malignant neoplasm of prostate: Secondary | ICD-10-CM | POA: Diagnosis not present

## 2022-08-31 DIAGNOSIS — Z7901 Long term (current) use of anticoagulants: Secondary | ICD-10-CM | POA: Diagnosis not present

## 2022-08-31 DIAGNOSIS — N182 Chronic kidney disease, stage 2 (mild): Secondary | ICD-10-CM | POA: Diagnosis present

## 2022-08-31 DIAGNOSIS — Z801 Family history of malignant neoplasm of trachea, bronchus and lung: Secondary | ICD-10-CM

## 2022-08-31 DIAGNOSIS — R310 Gross hematuria: Secondary | ICD-10-CM | POA: Diagnosis not present

## 2022-08-31 DIAGNOSIS — T8383XA Hemorrhage of genitourinary prosthetic devices, implants and grafts, initial encounter: Secondary | ICD-10-CM | POA: Diagnosis not present

## 2022-08-31 DIAGNOSIS — M25552 Pain in left hip: Secondary | ICD-10-CM | POA: Diagnosis not present

## 2022-08-31 DIAGNOSIS — J9601 Acute respiratory failure with hypoxia: Secondary | ICD-10-CM | POA: Diagnosis not present

## 2022-08-31 DIAGNOSIS — E875 Hyperkalemia: Secondary | ICD-10-CM | POA: Diagnosis not present

## 2022-08-31 DIAGNOSIS — I4891 Unspecified atrial fibrillation: Secondary | ICD-10-CM | POA: Diagnosis not present

## 2022-08-31 DIAGNOSIS — J690 Pneumonitis due to inhalation of food and vomit: Secondary | ICD-10-CM | POA: Diagnosis not present

## 2022-08-31 DIAGNOSIS — L89892 Pressure ulcer of other site, stage 2: Secondary | ICD-10-CM | POA: Diagnosis present

## 2022-08-31 DIAGNOSIS — W1830XA Fall on same level, unspecified, initial encounter: Secondary | ICD-10-CM | POA: Diagnosis present

## 2022-08-31 DIAGNOSIS — I5023 Acute on chronic systolic (congestive) heart failure: Secondary | ICD-10-CM | POA: Diagnosis present

## 2022-08-31 DIAGNOSIS — T8383XD Hemorrhage of genitourinary prosthetic devices, implants and grafts, subsequent encounter: Secondary | ICD-10-CM | POA: Diagnosis not present

## 2022-08-31 DIAGNOSIS — Z8546 Personal history of malignant neoplasm of prostate: Secondary | ICD-10-CM

## 2022-08-31 DIAGNOSIS — R400 Somnolence: Secondary | ICD-10-CM | POA: Diagnosis not present

## 2022-08-31 DIAGNOSIS — M542 Cervicalgia: Secondary | ICD-10-CM | POA: Diagnosis not present

## 2022-08-31 DIAGNOSIS — F0390 Unspecified dementia without behavioral disturbance: Secondary | ICD-10-CM | POA: Diagnosis present

## 2022-08-31 DIAGNOSIS — S12100A Unspecified displaced fracture of second cervical vertebra, initial encounter for closed fracture: Secondary | ICD-10-CM

## 2022-08-31 DIAGNOSIS — G9341 Metabolic encephalopathy: Secondary | ICD-10-CM | POA: Diagnosis not present

## 2022-08-31 DIAGNOSIS — M6282 Rhabdomyolysis: Secondary | ICD-10-CM | POA: Diagnosis present

## 2022-08-31 DIAGNOSIS — I480 Paroxysmal atrial fibrillation: Secondary | ICD-10-CM | POA: Diagnosis not present

## 2022-08-31 DIAGNOSIS — Z781 Physical restraint status: Secondary | ICD-10-CM

## 2022-08-31 DIAGNOSIS — R1310 Dysphagia, unspecified: Secondary | ICD-10-CM | POA: Diagnosis present

## 2022-08-31 DIAGNOSIS — I252 Old myocardial infarction: Secondary | ICD-10-CM

## 2022-08-31 DIAGNOSIS — I2510 Atherosclerotic heart disease of native coronary artery without angina pectoris: Secondary | ICD-10-CM

## 2022-08-31 DIAGNOSIS — Y658 Other specified misadventures during surgical and medical care: Secondary | ICD-10-CM | POA: Diagnosis not present

## 2022-08-31 DIAGNOSIS — Z8673 Personal history of transient ischemic attack (TIA), and cerebral infarction without residual deficits: Secondary | ICD-10-CM

## 2022-08-31 DIAGNOSIS — S0003XA Contusion of scalp, initial encounter: Secondary | ICD-10-CM | POA: Diagnosis not present

## 2022-08-31 DIAGNOSIS — Z66 Do not resuscitate: Secondary | ICD-10-CM | POA: Diagnosis present

## 2022-08-31 DIAGNOSIS — B379 Candidiasis, unspecified: Secondary | ICD-10-CM | POA: Diagnosis not present

## 2022-08-31 DIAGNOSIS — E872 Acidosis, unspecified: Secondary | ICD-10-CM | POA: Diagnosis not present

## 2022-08-31 DIAGNOSIS — S22071A Stable burst fracture of T9-T10 vertebra, initial encounter for closed fracture: Secondary | ICD-10-CM | POA: Diagnosis not present

## 2022-08-31 DIAGNOSIS — R0902 Hypoxemia: Secondary | ICD-10-CM | POA: Diagnosis not present

## 2022-08-31 DIAGNOSIS — J449 Chronic obstructive pulmonary disease, unspecified: Secondary | ICD-10-CM | POA: Diagnosis present

## 2022-08-31 DIAGNOSIS — H3530 Unspecified macular degeneration: Secondary | ICD-10-CM | POA: Diagnosis present

## 2022-08-31 DIAGNOSIS — Z91048 Other nonmedicinal substance allergy status: Secondary | ICD-10-CM

## 2022-08-31 DIAGNOSIS — K8020 Calculus of gallbladder without cholecystitis without obstruction: Secondary | ICD-10-CM | POA: Diagnosis not present

## 2022-08-31 DIAGNOSIS — I482 Chronic atrial fibrillation, unspecified: Secondary | ICD-10-CM | POA: Diagnosis present

## 2022-08-31 DIAGNOSIS — R627 Adult failure to thrive: Secondary | ICD-10-CM | POA: Diagnosis not present

## 2022-08-31 DIAGNOSIS — Z9181 History of falling: Secondary | ICD-10-CM

## 2022-08-31 DIAGNOSIS — Z452 Encounter for adjustment and management of vascular access device: Secondary | ICD-10-CM | POA: Diagnosis not present

## 2022-08-31 DIAGNOSIS — T17990A Other foreign object in respiratory tract, part unspecified in causing asphyxiation, initial encounter: Secondary | ICD-10-CM | POA: Diagnosis present

## 2022-08-31 DIAGNOSIS — R531 Weakness: Secondary | ICD-10-CM | POA: Diagnosis not present

## 2022-08-31 DIAGNOSIS — R569 Unspecified convulsions: Secondary | ICD-10-CM | POA: Diagnosis not present

## 2022-08-31 DIAGNOSIS — E876 Hypokalemia: Secondary | ICD-10-CM | POA: Diagnosis present

## 2022-08-31 DIAGNOSIS — Z6822 Body mass index (BMI) 22.0-22.9, adult: Secondary | ICD-10-CM

## 2022-08-31 DIAGNOSIS — N179 Acute kidney failure, unspecified: Secondary | ICD-10-CM | POA: Diagnosis not present

## 2022-08-31 DIAGNOSIS — I509 Heart failure, unspecified: Secondary | ICD-10-CM | POA: Diagnosis not present

## 2022-08-31 DIAGNOSIS — R296 Repeated falls: Secondary | ICD-10-CM | POA: Diagnosis present

## 2022-08-31 DIAGNOSIS — I255 Ischemic cardiomyopathy: Secondary | ICD-10-CM | POA: Diagnosis not present

## 2022-08-31 DIAGNOSIS — G8929 Other chronic pain: Secondary | ICD-10-CM | POA: Diagnosis present

## 2022-08-31 DIAGNOSIS — I739 Peripheral vascular disease, unspecified: Secondary | ICD-10-CM | POA: Diagnosis not present

## 2022-08-31 DIAGNOSIS — R7989 Other specified abnormal findings of blood chemistry: Secondary | ICD-10-CM | POA: Diagnosis not present

## 2022-08-31 DIAGNOSIS — Z8249 Family history of ischemic heart disease and other diseases of the circulatory system: Secondary | ICD-10-CM

## 2022-08-31 DIAGNOSIS — S065X9A Traumatic subdural hemorrhage with loss of consciousness of unspecified duration, initial encounter: Secondary | ICD-10-CM

## 2022-08-31 DIAGNOSIS — N390 Urinary tract infection, site not specified: Secondary | ICD-10-CM | POA: Diagnosis not present

## 2022-08-31 DIAGNOSIS — Z9861 Coronary angioplasty status: Secondary | ICD-10-CM | POA: Diagnosis not present

## 2022-08-31 DIAGNOSIS — Z7902 Long term (current) use of antithrombotics/antiplatelets: Secondary | ICD-10-CM

## 2022-08-31 DIAGNOSIS — R109 Unspecified abdominal pain: Secondary | ICD-10-CM | POA: Diagnosis not present

## 2022-08-31 DIAGNOSIS — F17200 Nicotine dependence, unspecified, uncomplicated: Secondary | ICD-10-CM | POA: Diagnosis present

## 2022-08-31 DIAGNOSIS — Z951 Presence of aortocoronary bypass graft: Secondary | ICD-10-CM

## 2022-08-31 DIAGNOSIS — L8990 Pressure ulcer of unspecified site, unspecified stage: Secondary | ICD-10-CM | POA: Diagnosis present

## 2022-08-31 DIAGNOSIS — R0602 Shortness of breath: Secondary | ICD-10-CM | POA: Diagnosis not present

## 2022-08-31 DIAGNOSIS — L89152 Pressure ulcer of sacral region, stage 2: Secondary | ICD-10-CM | POA: Diagnosis present

## 2022-08-31 DIAGNOSIS — S12110A Anterior displaced Type II dens fracture, initial encounter for closed fracture: Secondary | ICD-10-CM | POA: Diagnosis not present

## 2022-08-31 DIAGNOSIS — I6200 Nontraumatic subdural hemorrhage, unspecified: Secondary | ICD-10-CM | POA: Diagnosis not present

## 2022-08-31 DIAGNOSIS — E870 Hyperosmolality and hypernatremia: Secondary | ICD-10-CM | POA: Diagnosis present

## 2022-08-31 DIAGNOSIS — F17210 Nicotine dependence, cigarettes, uncomplicated: Secondary | ICD-10-CM | POA: Diagnosis present

## 2022-08-31 DIAGNOSIS — E440 Moderate protein-calorie malnutrition: Secondary | ICD-10-CM | POA: Diagnosis not present

## 2022-08-31 DIAGNOSIS — Y92000 Kitchen of unspecified non-institutional (private) residence as  the place of occurrence of the external cause: Secondary | ICD-10-CM | POA: Diagnosis not present

## 2022-08-31 DIAGNOSIS — Z79899 Other long term (current) drug therapy: Secondary | ICD-10-CM

## 2022-08-31 DIAGNOSIS — Z7189 Other specified counseling: Secondary | ICD-10-CM | POA: Diagnosis not present

## 2022-08-31 DIAGNOSIS — E785 Hyperlipidemia, unspecified: Secondary | ICD-10-CM | POA: Diagnosis present

## 2022-08-31 DIAGNOSIS — L89321 Pressure ulcer of left buttock, stage 1: Secondary | ICD-10-CM | POA: Diagnosis present

## 2022-08-31 DIAGNOSIS — S12112A Nondisplaced Type II dens fracture, initial encounter for closed fracture: Secondary | ICD-10-CM | POA: Diagnosis not present

## 2022-08-31 DIAGNOSIS — R079 Chest pain, unspecified: Secondary | ICD-10-CM | POA: Diagnosis not present

## 2022-08-31 DIAGNOSIS — D72829 Elevated white blood cell count, unspecified: Secondary | ICD-10-CM | POA: Diagnosis not present

## 2022-08-31 DIAGNOSIS — S199XXA Unspecified injury of neck, initial encounter: Secondary | ICD-10-CM | POA: Diagnosis not present

## 2022-08-31 DIAGNOSIS — W19XXXA Unspecified fall, initial encounter: Secondary | ICD-10-CM | POA: Diagnosis not present

## 2022-08-31 DIAGNOSIS — G9340 Encephalopathy, unspecified: Secondary | ICD-10-CM | POA: Diagnosis present

## 2022-08-31 LAB — PROTIME-INR
INR: 1.3 — ABNORMAL HIGH (ref 0.8–1.2)
Prothrombin Time: 16.5 seconds — ABNORMAL HIGH (ref 11.4–15.2)

## 2022-08-31 LAB — COMPREHENSIVE METABOLIC PANEL
ALT: 51 U/L — ABNORMAL HIGH (ref 0–44)
AST: 104 U/L — ABNORMAL HIGH (ref 15–41)
Albumin: 3 g/dL — ABNORMAL LOW (ref 3.5–5.0)
Alkaline Phosphatase: 55 U/L (ref 38–126)
Anion gap: 15 (ref 5–15)
BUN: 35 mg/dL — ABNORMAL HIGH (ref 8–23)
CO2: 22 mmol/L (ref 22–32)
Calcium: 9.6 mg/dL (ref 8.9–10.3)
Chloride: 104 mmol/L (ref 98–111)
Creatinine, Ser: 1.31 mg/dL — ABNORMAL HIGH (ref 0.61–1.24)
GFR, Estimated: 54 mL/min — ABNORMAL LOW (ref >60)
Glucose, Bld: 161 mg/dL — ABNORMAL HIGH (ref 70–99)
Potassium: 3.3 mmol/L — ABNORMAL LOW (ref 3.5–5.1)
Sodium: 141 mmol/L (ref 135–145)
Total Bilirubin: 1.5 mg/dL — ABNORMAL HIGH (ref 0.3–1.2)
Total Protein: 5.6 g/dL — ABNORMAL LOW (ref 6.5–8.1)

## 2022-08-31 LAB — URINALYSIS, W/ REFLEX TO CULTURE (INFECTION SUSPECTED)
Bilirubin Urine: NEGATIVE
Glucose, UA: NEGATIVE mg/dL
Ketones, ur: 20 mg/dL — AB
Leukocytes,Ua: NEGATIVE
Nitrite: POSITIVE — AB
Protein, ur: 100 mg/dL — AB
Specific Gravity, Urine: 1.016 (ref 1.005–1.030)
pH: 7 (ref 5.0–8.0)

## 2022-08-31 LAB — CBC WITH DIFFERENTIAL/PLATELET
Abs Immature Granulocytes: 0.05 10*3/uL (ref 0.00–0.07)
Basophils Absolute: 0 10*3/uL (ref 0.0–0.1)
Basophils Relative: 0 %
Eosinophils Absolute: 0 10*3/uL (ref 0.0–0.5)
Eosinophils Relative: 0 %
HCT: 40 % (ref 39.0–52.0)
Hemoglobin: 14 g/dL (ref 13.0–17.0)
Immature Granulocytes: 1 %
Lymphocytes Relative: 3 %
Lymphs Abs: 0.3 10*3/uL — ABNORMAL LOW (ref 0.7–4.0)
MCH: 33.9 pg (ref 26.0–34.0)
MCHC: 35 g/dL (ref 30.0–36.0)
MCV: 96.9 fL (ref 80.0–100.0)
Monocytes Absolute: 0.9 10*3/uL (ref 0.1–1.0)
Monocytes Relative: 10 %
Neutro Abs: 8 10*3/uL — ABNORMAL HIGH (ref 1.7–7.7)
Neutrophils Relative %: 86 %
Platelets: 321 10*3/uL (ref 150–400)
RBC: 4.13 MIL/uL — ABNORMAL LOW (ref 4.22–5.81)
RDW: 12.4 % (ref 11.5–15.5)
WBC: 9.2 10*3/uL (ref 4.0–10.5)
nRBC: 0 % (ref 0.0–0.2)

## 2022-08-31 LAB — APTT: aPTT: 30 seconds (ref 24–36)

## 2022-08-31 LAB — TROPONIN I (HIGH SENSITIVITY)
Troponin I (High Sensitivity): 208 ng/L (ref <18)
Troponin I (High Sensitivity): 210 ng/L (ref <18)

## 2022-08-31 LAB — I-STAT CHEM 8, ED
BUN: 33 mg/dL — ABNORMAL HIGH (ref 8–23)
Calcium, Ion: 1.29 mmol/L (ref 1.15–1.40)
Chloride: 106 mmol/L (ref 98–111)
Creatinine, Ser: 1 mg/dL (ref 0.61–1.24)
Glucose, Bld: 152 mg/dL — ABNORMAL HIGH (ref 70–99)
HCT: 39 % (ref 39.0–52.0)
Hemoglobin: 13.3 g/dL (ref 13.0–17.0)
Potassium: 3.3 mmol/L — ABNORMAL LOW (ref 3.5–5.1)
Sodium: 144 mmol/L (ref 135–145)
TCO2: 23 mmol/L (ref 22–32)

## 2022-08-31 LAB — COOXEMETRY PANEL
Carboxyhemoglobin: 2 % — ABNORMAL HIGH (ref 0.5–1.5)
Methemoglobin: 0.9 % (ref 0.0–1.5)
O2 Saturation: 82.7 %
Total hemoglobin: 14.4 g/dL (ref 12.0–16.0)

## 2022-08-31 LAB — LACTIC ACID, PLASMA
Lactic Acid, Venous: 2.3 mmol/L (ref 0.5–1.9)
Lactic Acid, Venous: 2.6 mmol/L (ref 0.5–1.9)

## 2022-08-31 LAB — CK: Total CK: 4821 U/L — ABNORMAL HIGH (ref 49–397)

## 2022-08-31 MED ORDER — SODIUM CHLORIDE 0.9 % IV SOLN
2.0000 g | Freq: Once | INTRAVENOUS | Status: AC
  Administered 2022-08-31: 2 g via INTRAVENOUS
  Filled 2022-08-31: qty 12.5

## 2022-08-31 MED ORDER — VANCOMYCIN HCL IN DEXTROSE 1-5 GM/200ML-% IV SOLN: 1000.0000 mg | Freq: Once | INTRAVENOUS | Status: DC

## 2022-08-31 MED ORDER — SODIUM CHLORIDE 0.9 % IV SOLN
2.0000 g | Freq: Two times a day (BID) | INTRAVENOUS | Status: DC
  Administered 2022-09-01 – 2022-09-03 (×5): 2 g via INTRAVENOUS
  Filled 2022-08-31 (×5): qty 12.5

## 2022-08-31 MED ORDER — LACTATED RINGERS IV BOLUS
500.0000 mL | Freq: Once | INTRAVENOUS | Status: AC
  Administered 2022-08-31: 500 mL via INTRAVENOUS

## 2022-08-31 MED ORDER — ASPIRIN 81 MG PO CHEW
324.0000 mg | CHEWABLE_TABLET | Freq: Once | ORAL | Status: DC
  Filled 2022-08-31: qty 4

## 2022-08-31 MED ORDER — VANCOMYCIN HCL 750 MG/150ML IV SOLN
750.0000 mg | Freq: Two times a day (BID) | INTRAVENOUS | Status: DC
  Administered 2022-09-01 – 2022-09-03 (×5): 750 mg via INTRAVENOUS
  Filled 2022-08-31 (×5): qty 150

## 2022-08-31 MED ORDER — LACTATED RINGERS IV SOLN: INTRAVENOUS | Status: DC

## 2022-08-31 MED ORDER — IOHEXOL 350 MG/ML SOLN
75.0000 mL | Freq: Once | INTRAVENOUS | Status: AC | PRN
  Administered 2022-08-31: 75 mL via INTRAVENOUS

## 2022-08-31 MED ORDER — METRONIDAZOLE 500 MG/100ML IV SOLN
500.0000 mg | Freq: Once | INTRAVENOUS | Status: AC
  Administered 2022-08-31: 500 mg via INTRAVENOUS
  Filled 2022-08-31: qty 100

## 2022-08-31 MED ORDER — ACETAMINOPHEN 650 MG RE SUPP
650.0000 mg | Freq: Once | RECTAL | Status: AC
  Administered 2022-08-31: 650 mg via RECTAL
  Filled 2022-08-31: qty 1

## 2022-08-31 MED ORDER — VANCOMYCIN HCL 1500 MG/300ML IV SOLN
1500.0000 mg | Freq: Once | INTRAVENOUS | Status: AC
  Administered 2022-08-31: 1500 mg via INTRAVENOUS
  Filled 2022-08-31: qty 300

## 2022-08-31 NOTE — ED Triage Notes (Signed)
Pt to ED via [facility] from home c/o fall.  Per EMS family unable to get in touch with patient or wife and had neighbors check on them still with no answer, had wellness check on house with no answer and forced entry made and pt and wife found on kitchen floor.  Per neighbor wife was wearing same clothes last seen in 1 week ago.  Pt presents lethargic, confused, covered in feces and foul smelling urine, C-collar in place, c/o neck and back pain.  ED at bedside on arrival.  EMS vitals 134/60 manual BP, 120 HR a.fib, 96% RA and placed on 4L [place] up to 98%, 168 CBG, given 500cc LR.

## 2022-08-31 NOTE — ED Provider Notes (Signed)
[place] EMERGENCY DEPARTMENT AT [facility] Provider Note   CSN: [id number] Arrival date & time: 08/31/22  1953     History {Add pertinent medical, surgical, social history, OB history to HPI:1} Chief Complaint  Patient presents with   Fall   Code Sepsis    [name] is a 82 y.o. male.  HPI 82 year old male presents via EMS for fall.  History is solely from EMS at this point as the patient is confused, unclear what his baseline is.  EMS was called as both patient and wife were on the floor and were on the floor for possibly up to a week.  According to EMS, the neighbor who helped call attention to [phone] noted that the wife seem to be in the same outfit she was in 1 week ago.  Otherwise, the preceding history is unclear.  The patient was tachycardic and felt very hot and so sepsis protocol was started by EMS.  When I asked the patient, he does indicate some back pain and feels short of breath.  He does say yes to chest pain.  EMS notes that the fire department was indicating they were going to check for gas but he was not sure exactly why, such as if an alarm had gone off.  Home Medications Prior to Admission medications   Medication Sig Start Date End Date Taking? Authorizing Provider  aspirin EC 81 MG tablet Take 1 tablet (81 mg total) by mouth daily. 10/03/17   Kathleene Hazel, MD  atorvastatin (LIPITOR) 20 MG tablet Take 1 tablet (20 mg total) by mouth daily. 04/18/22 04/18/23  Plotnikov, [name], MD  clopidogrel (PLAVIX) 300 MG TABS tablet Take 1 tablet by mouth daily.    [provider]  doxycycline (VIBRA-TABS) 100 MG tablet Take 1 tablet (100 mg total) by mouth 2 (two) times daily. 12/20/21   Plotnikov, [name], MD  ELIQUIS 5 MG TABS tablet TAKE 1 TABLET TWICE A DAY 06/27/22   Plotnikov, [name], MD  fluticasone (FLONASE) 50 MCG/ACT nasal spray Place 1 spray into both nostrils daily as needed for allergies or rhinitis. 03/16/22   Plotnikov,  [name], MD  folic acid (FOLVITE) 1 MG tablet TAKE 1 TABLET DAILY 02/28/22   Plotnikov, [name], MD  KLOR-CON 8 MEQ tablet TAKE 1 TABLET DAILY (ANNUAL APPOINTMENT DUE WITH LABS, MUST SEE PROVIDER FOR FUTURE REFILLS) 02/12/22   Plotnikov, [name], MD  loratadine (CLARITIN) 10 MG tablet Take 10 mg by mouth daily.    [provider]  LORazepam (ATIVAN) 0.5 MG tablet Take 0.5 mg by mouth daily as needed. 03/26/20   [provider]  metoprolol succinate (TOPROL-XL) 50 MG 24 hr tablet TAKE 1 TABLET DAILY 06/18/22   Kathleene Hazel, MD  nitroGLYCERIN (NITROSTAT) 0.4 MG SL tablet Place 0.4 mg under the tongue as needed. Chest pain    [provider]  pantoprazole (PROTONIX) 40 MG tablet Take 1 tablet (40 mg total) by mouth daily. 02/13/22   Plotnikov, [name], MD  torsemide (DEMADEX) 20 MG tablet Take 1-2 tablets (20-40 mg total) by mouth daily. 12/20/21   Plotnikov, [name], MD  traMADol (ULTRAM) 50 MG tablet Take 1 tablet (50 mg total) by mouth every 6 (six) hours as needed for severe pain. Pain 04/12/22   Plotnikov, [name], MD  traZODone (DESYREL) 50 MG tablet Take 1 tablet by mouth at bedtime. 01/29/22 11/23/22  [provider]  zolpidem (AMBIEN) 5 MG tablet  Take 1 tablet (5 mg total) by mouth at bedtime. 04/09/19   Plotnikov, [name], MD      Allergies    Adhesive [tape], Codeine sulfate, Hydrocodone, Hydrocodone-acetaminophen, Other, and Wound dressing adhesive    Review of Systems   Review of Systems  Unable to perform ROS: Mental status change    Physical Exam Updated Vital Signs BP (!) 145/77   Pulse (!) 113   Temp (!) 100.9 F (38.3 C) (Rectal)   Resp (!) 23   Ht 5\' 11"  (1.803 m)   Wt 77.1 kg   SpO2 95%   BMI 23.71 kg/m  Physical Exam Vitals and nursing note reviewed.  Constitutional:      Appearance: He is well-developed. He is not diaphoretic.     Interventions: Cervical collar in place.  HENT:     Head: Normocephalic and  atraumatic.     Mouth/Throat:     Comments: Patient has some secretions in his mouth with weak cough Cardiovascular:     Rate and Rhythm: Tachycardia present. Rhythm irregular.     Pulses:          Dorsalis pedis pulses are 2+ on the right side and 2+ on the left side.     Heart sounds: Normal heart sounds.  Pulmonary:     Effort: Pulmonary effort is normal.     Breath sounds: Normal breath sounds.  Abdominal:     Palpations: Abdomen is soft.     Tenderness: There is abdominal tenderness (lower).  Musculoskeletal:     Comments: Patient seems to have pain when I try to range either hip. Also some tenderness to both without deformity  Skin:    General: Skin is warm and dry.     Comments: Patient's skin is covered in a black substance (soot?). Early skin breakdown on his buttocks.  Neurological:     Mental Status: He is alert. He is disoriented.     Comments: Normal grip strength in BUE. Barely able to move lower extremities side to side.      ED Results / Procedures / Treatments   Labs (all labs ordered are listed, but only abnormal results are displayed) Labs Reviewed  LACTIC ACID, PLASMA - Abnormal; Notable for the following components:      Result Value   Lactic Acid, Venous 2.3 (*)    All other components within normal limits  LACTIC ACID, PLASMA - Abnormal; Notable for the following components:   Lactic Acid, Venous 2.6 (*)    All other components within normal limits  COMPREHENSIVE METABOLIC PANEL - Abnormal; Notable for the following components:   Potassium 3.3 (*)    Glucose, Bld 161 (*)    BUN 35 (*)    Creatinine, Ser 1.31 (*)    Total Protein 5.6 (*)    Albumin 3.0 (*)    AST 104 (*)    ALT 51 (*)    Total Bilirubin 1.5 (*)    GFR, Estimated 54 (*)    All other components within normal limits  CBC WITH DIFFERENTIAL/PLATELET - Abnormal; Notable for the following components:   RBC 4.13 (*)    Neutro Abs 8.0 (*)    Lymphs Abs 0.3 (*)    All other components  within normal limits  PROTIME-INR - Abnormal; Notable for the following components:   Prothrombin Time 16.5 (*)    INR 1.3 (*)    All other components within normal limits  COOXEMETRY PANEL - Abnormal; Notable for the  following components:   Carboxyhemoglobin 2.0 (*)    All other components within normal limits  URINALYSIS, W/ REFLEX TO CULTURE (INFECTION SUSPECTED) - Abnormal; Notable for the following components:   APPearance HAZY (*)    Hgb urine dipstick MODERATE (*)    Ketones, ur 20 (*)    Protein, ur 100 (*)    Nitrite POSITIVE (*)    Bacteria, UA MANY (*)    All other components within normal limits  CK - Abnormal; Notable for the following components:   Total CK 4,821 (*)    All other components within normal limits  I-STAT CHEM 8, ED - Abnormal; Notable for the following components:   Potassium 3.3 (*)    BUN 33 (*)    Glucose, Bld 152 (*)    All other components within normal limits  TROPONIN I (HIGH SENSITIVITY) - Abnormal; Notable for the following components:   Troponin I (High Sensitivity) 208 (*)    All other components within normal limits  TROPONIN I (HIGH SENSITIVITY) - Abnormal; Notable for the following components:   Troponin I (High Sensitivity) 210 (*)    All other components within normal limits  CULTURE, BLOOD (ROUTINE X 2)  CULTURE, BLOOD (ROUTINE X 2)  MRSA NEXT GEN BY PCR, NASAL  APTT  LACTIC ACID, PLASMA  LACTIC ACID, PLASMA    EKG EKG Interpretation  Date/Time:  Friday Aug 31 2022 20:00:48 EDT Ventricular Rate:  127 PR Interval:    QRS Duration: 81 QT Interval:  299 QTC Calculation: 435 R Axis:   -45 Text Interpretation: Atrial fibrillation with rapid V-rate Ventricular premature complex Left anterior fascicular block Abnormal R-wave progression, late transition Confirmed by Pricilla Loveless [phone]) on 08/31/2022 9:41:06 PM  Radiology CT CHEST ABDOMEN PELVIS W CONTRAST  Result Date: 08/31/2022 CLINICAL DATA:  Sepsis EXAM: CT CHEST,  ABDOMEN, AND PELVIS WITH CONTRAST TECHNIQUE: Multidetector CT imaging of the chest, abdomen and pelvis was performed following the standard protocol during bolus administration of intravenous contrast. RADIATION DOSE REDUCTION: This exam was performed according to the departmental dose-optimization program which includes automated exposure control, adjustment of the mA and/or kV according to patient size and/or use of iterative reconstruction technique. CONTRAST:  75mL OMNIPAQUE IOHEXOL 350 MG/ML SOLN COMPARISON:  None Available. FINDINGS: CHEST: Cardiovascular: No aortic injury. The thoracic aorta is normal in caliber. The heart is normal in size. No significant pericardial effusion. Severe atherosclerotic plaque. The main pulmonary artery is normal in caliber. Four-vessel coronary calcifications status post coronary artery bypass graft. No central pulmonary embolus. Mediastinum/Nodes: No pneumomediastinum. No mediastinal hematoma. The esophagus is unremarkable. The thyroid is unremarkable. The central airways are patent. No mediastinal, hilar, or axillary lymphadenopathy. Lungs/Pleura: Emphysematous changes. No focal consolidation. No pulmonary nodule. No pulmonary mass. No pulmonary contusion or laceration. No pneumatocele formation. No pleural effusion. No pneumothorax. No hemothorax. Musculoskeletal/Chest wall: No chest wall mass.  Trace bilateral gynecomastia. No acute rib or sternal fracture. Chronic T7, T8, and T9 spinous process fractures. Associated chronic appearing burst fracture of the T9 vertebral body. No retropulsion into the central canal. Approximately at least 40% vertebral body height loss centrally. ABDOMEN / PELVIS: Hepatobiliary: Not enlarged. Vague slightly nodular hypodensity along the falciform ligament also noted on delayed imaging suggestive of focal fatty infiltration. No laceration or subcapsular hematoma. Cholelithiasis.  No biliary ductal dilatation. Pancreas: Normal pancreatic  contour. No main pancreatic duct dilatation. Spleen: Not enlarged. No focal lesion. No laceration, subcapsular hematoma, or vascular injury. Adrenals/Urinary Tract: No nodularity  bilaterally. Calcification associated with the kidneys likely vascular. No obstructive nephrolithiasis. Bilateral kidneys enhance symmetrically. No hydronephrosis. No contusion, laceration, or subcapsular hematoma. No injury to the vascular structures or collecting systems. No hydroureter. The urinary bladder is unremarkable. Stomach/Bowel: No small or large bowel wall thickening or dilatation. The appendix is not definitely identified with no inflammatory changes in the right lower quadrant to suggest acute appendicitis. Vasculature/Lymphatics: Severe atherosclerotic plaque. No abdominal aorta or iliac aneurysm. No active contrast extravasation or pseudoaneurysm. No abdominal, pelvic, inguinal lymphadenopathy. Reproductive: Normal. Other: No simple free fluid ascites. No pneumoperitoneum. No hemoperitoneum. No mesenteric hematoma identified. No organized fluid collection. Musculoskeletal: No significant soft tissue hematoma. No acute pelvic fracture.  No acute spinal fracture. Ports and Devices: None. IMPRESSION: 1. No acute intrathoracic, intra-abdominal, intrapelvic traumatic injury. 2. Chronic T9 burst fracture-correlate with point tenderness to palpation to evaluate for an acute component. Otherwise no definite acute fracture or traumatic malalignment of the thoracic or lumbar spine. 3. Other imaging findings of potential clinical significance: Cholelithiasis with no acute cholecystitis. Stool throughout the colon-correlate for constipation. Aortic Atherosclerosis (ICD10-I70.0) and Emphysema (ICD10-J43.9). Electronically Signed   By: Tish Frederickson M.D.   On: 08/31/2022 22:57   CT Head Wo Contrast  Result Date: 08/31/2022 CLINICAL DATA:  Neck trauma (Age >= 65y); Mental status change, unknown cause EXAM: CT HEAD WITHOUT CONTRAST  CT CERVICAL SPINE WITHOUT CONTRAST TECHNIQUE: Multidetector CT imaging of the head and cervical spine was performed following the standard protocol without intravenous contrast. Multiplanar CT image reconstructions of the cervical spine were also generated. RADIATION DOSE REDUCTION: This exam was performed according to the departmental dose-optimization program which includes automated exposure control, adjustment of the mA and/or kV according to patient size and/or use of iterative reconstruction technique. COMPARISON:  CT head and C-spine 10/23/2021 FINDINGS: CT HEAD FINDINGS Brain: Cerebral ventricle sizes are concordant with the degree of cerebral volume loss. Trace patchy and confluent areas of decreased attenuation are noted throughout the deep and periventricular white matter of the cerebral hemispheres bilaterally, compatible with chronic microvascular ischemic disease. No evidence of large-territorial acute infarction. No parenchymal hemorrhage. No mass lesion. No extra-axial collection. No mass effect or midline shift. No hydrocephalus. Basilar cisterns are patent. Vascular: No hyperdense vessel. Skull: No acute fracture or focal lesion. Sinuses/Orbits: Paranasal sinuses and mastoid air cells are clear. The orbits are unremarkable. Other: 6 mm left frontal scalp hematoma. 13 mm right parieto-occipital scalp hematoma formation. CT CERVICAL SPINE FINDINGS Alignment: Normal. Skull base and vertebrae: Chronic C2 dens fracture with increased separation of the fracture fragments now up to 3 mm (from less than 1 mm). No aggressive appearing focal osseous lesion or focal pathologic process. Soft tissues and spinal canal: No prevertebral fluid or swelling. No visible canal hematoma. Upper chest: Emphysematous changes. Other: Atherosclerotic plaque of the carotid arteries within the neck. IMPRESSION: 1.  No acute intracranial abnormality. 2. Chronic C2 dens fracture with increased separation of the fracture  fragments up to 3 mm (from less than 1 mm). Findings may be acute versus chronic. If clinically indicated, consider MRI cervical spine for further evaluation. 3. Otherwise no acute displaced fracture or traumatic listhesis of the cervical spine. 4. A 6 mm left frontal scalp hematoma. 5. A 13 mm right parieto-occipital scalp hematoma formation. 6. Emphysema (ICD10-J43.9). Electronically Signed   By: Tish Frederickson M.D.   On: 08/31/2022 22:37   CT Cervical Spine Wo Contrast  Result Date: 08/31/2022 CLINICAL DATA:  Neck trauma (Age >=  65y); Mental status change, unknown cause EXAM: CT HEAD WITHOUT CONTRAST CT CERVICAL SPINE WITHOUT CONTRAST TECHNIQUE: Multidetector CT imaging of the head and cervical spine was performed following the standard protocol without intravenous contrast. Multiplanar CT image reconstructions of the cervical spine were also generated. RADIATION DOSE REDUCTION: This exam was performed according to the departmental dose-optimization program which includes automated exposure control, adjustment of the mA and/or kV according to patient size and/or use of iterative reconstruction technique. COMPARISON:  CT head and C-spine 10/23/2021 FINDINGS: CT HEAD FINDINGS Brain: Cerebral ventricle sizes are concordant with the degree of cerebral volume loss. Trace patchy and confluent areas of decreased attenuation are noted throughout the deep and periventricular white matter of the cerebral hemispheres bilaterally, compatible with chronic microvascular ischemic disease. No evidence of large-territorial acute infarction. No parenchymal hemorrhage. No mass lesion. No extra-axial collection. No mass effect or midline shift. No hydrocephalus. Basilar cisterns are patent. Vascular: No hyperdense vessel. Skull: No acute fracture or focal lesion. Sinuses/Orbits: Paranasal sinuses and mastoid air cells are clear. The orbits are unremarkable. Other: 6 mm left frontal scalp hematoma. 13 mm right parieto-occipital  scalp hematoma formation. CT CERVICAL SPINE FINDINGS Alignment: Normal. Skull base and vertebrae: Chronic C2 dens fracture with increased separation of the fracture fragments now up to 3 mm (from less than 1 mm). No aggressive appearing focal osseous lesion or focal pathologic process. Soft tissues and spinal canal: No prevertebral fluid or swelling. No visible canal hematoma. Upper chest: Emphysematous changes. Other: Atherosclerotic plaque of the carotid arteries within the neck. IMPRESSION: 1.  No acute intracranial abnormality. 2. Chronic C2 dens fracture with increased separation of the fracture fragments up to 3 mm (from less than 1 mm). Findings may be acute versus chronic. If clinically indicated, consider MRI cervical spine for further evaluation. 3. Otherwise no acute displaced fracture or traumatic listhesis of the cervical spine. 4. A 6 mm left frontal scalp hematoma. 5. A 13 mm right parieto-occipital scalp hematoma formation. 6. Emphysema (ICD10-J43.9). Electronically Signed   By: Tish Frederickson M.D.   On: 08/31/2022 22:37   DG Pelvis Portable  Result Date: 08/31/2022 CLINICAL DATA:  Fall, weakness. EXAM: PORTABLE PELVIS 1-2 VIEWS COMPARISON:  None Available. FINDINGS: The cortical margins of the bony pelvis are intact. No fracture. Pubic symphysis and sacroiliac joints are congruent. Both femoral heads are well-seated in the respective acetabula. External artifact projects over the lateral left hip. Extensive arterial vascular calcifications. IMPRESSION: No pelvic fracture. Extensive arterial vascular calcifications. Electronically Signed   By: Narda Rutherford M.D.   On: 08/31/2022 20:38   DG Chest Port 1 View  Result Date: 08/31/2022 CLINICAL DATA:  Weakness nose, fall. EXAM: PORTABLE CHEST 1 VIEW COMPARISON:  Radiograph 06/08/2015 FINDINGS: Prior median sternotomy and CABG. Stable heart size allowing for lower lung volumes. Unchanged mediastinal contours. Aortic atherosclerosis. Patchy  airspace opacity at the retrocardiac left lung base. No pneumothorax or large pleural effusion. On limited assessment, no acute osseous abnormalities are seen. IMPRESSION: 1. Patchy airspace opacity at the retrocardiac left lung base. This may represent pneumonia, although possibility of pulmonary contusion is considered in the setting of fall. 2. Prior CABG. Electronically Signed   By: Narda Rutherford M.D.   On: 08/31/2022 20:37    Procedures .Critical Care  Performed by: Pricilla Loveless, MD Authorized by: Pricilla Loveless, MD   Critical care provider statement:    Critical care time (minutes):  40   Critical care time was exclusive of:  Separately billable  procedures and treating other patients   Critical care was necessary to treat or prevent imminent or life-threatening deterioration of the following conditions:  Sepsis and CNS failure or compromise   Critical care was time spent personally by me on the following activities:  Development of treatment plan with patient or surrogate, discussions with consultants, evaluation of patient's response to treatment, examination of patient, ordering and review of laboratory studies, ordering and review of radiographic studies, ordering and performing treatments and interventions, pulse oximetry, re-evaluation of patient's condition and review of old charts   {Document cardiac monitor, telemetry assessment procedure when appropriate:1}  Medications Ordered in ED Medications  ceFEPIme (MAXIPIME) 2 g in sodium chloride 0.9 % 100 mL IVPB (has no administration in time range)  vancomycin (VANCOREADY) IVPB 750 mg/150 mL (has no administration in time range)  aspirin chewable tablet 324 mg (has no administration in time range)  lactated ringers infusion (has no administration in time range)  acetaminophen (TYLENOL) suppository 650 mg (650 mg Rectal Given 08/31/22 2053)  ceFEPIme (MAXIPIME) 2 g in sodium chloride 0.9 % 100 mL IVPB (0 g Intravenous Stopped  08/31/22 2138)  metroNIDAZOLE (FLAGYL) IVPB 500 mg (0 mg Intravenous Stopped 08/31/22 2239)  vancomycin (VANCOREADY) IVPB 1500 mg/300 mL (1,500 mg Intravenous New Bag/Given 08/31/22 2142)  iohexol (OMNIPAQUE) 350 MG/ML injection 75 mL (75 mLs Intravenous Contrast Given 08/31/22 2221)    ED Course/ Medical Decision Making/ A&P Clinical Course as of 08/31/22 2344  Fri Aug 31, 2022  2043 Rectal temp is 100.9.  Chest x-ray with possible pneumonia versus contusion.  Will add on CT of the chest and abdomen and pelvis in addition to the CT head and C-spine that were already going to be performed.  Will call code sepsis and give broad antibiotics. [SG]    Clinical Course User Index [SG] Pricilla Loveless, MD   {   Click here for ABCD2, HEART and other calculatorsREFRESH Note before signing :1}                          Medical Decision Making Amount and/or Complexity of Data Reviewed Labs: ordered.    Details: Mild acute kidney injury.  CK 4000.  UA consistent with UTI.  Troponins elevated at 208 and 210 respectively. Radiology: ordered. ECG/medicine tests: ordered.  Risk OTC drugs. Prescription drug management. Decision regarding hospitalization.   ***  {Document critical care time when appropriate:1} {Document review of labs and clinical decision tools ie heart score, Chads2Vasc2 etc:1}  {Document your independent review of radiology images, and any outside records:1} {Document your discussion with family members, caretakers, and with consultants:1} {Document social determinants of health affecting pt's care:1} {Document your decision making why or why not admission, treatments were needed:1} Final Clinical Impression(s) / ED Diagnoses Final diagnoses:  Sepsis secondary to UTI (HCC)  Acute kidney injury (HCC)  Non-traumatic rhabdomyolysis  Closed odontoid fracture, initial encounter (HCC)    Rx / DC Orders ED Discharge Orders     None

## 2022-08-31 NOTE — Progress Notes (Signed)
Notified bedside nurse of need to draw repeat lactic acid. Order had dropped off the worklist. RN drawing it now.

## 2022-08-31 NOTE — Assessment & Plan Note (Signed)
No recent echocardiograms. Troponin : 208/210. Suspect 2.2 to sepsis  and or  demand.  We will get echo and follow.

## 2022-08-31 NOTE — Assessment & Plan Note (Signed)
Nicotine patch, counseling once patient is coherent.

## 2022-08-31 NOTE — Assessment & Plan Note (Signed)
Patient has a history of C2 fracture however imaging today shows increase in displacement currently patient in a c-collar and neurosurgery illness going to see patient tomorrow morning. IMPRESSION: 1.  No acute intracranial abnormality. 2. Chronic C2 dens fracture with increased separation of the fracture fragments up to 3 mm (from less than 1 mm). Findings may be acute versus chronic. If clinically indicated, consider MRI cervical spine for further evaluation. 3. Otherwise no acute displaced fracture or traumatic listhesis of the cervical spine. 4. A 6 mm left frontal scalp hematoma. 5. A 13 mm right parieto-occipital scalp hematoma formation. 6. Emphysema (ICD10-J43.9). We will obtain an MRI of the C-spine without contrast.

## 2022-08-31 NOTE — ED Notes (Signed)
EMS c-collar removed and replaced with [place] J collar per MD order.

## 2022-08-31 NOTE — Assessment & Plan Note (Signed)
Patient has a history of heart disease, will continue patient on Plavix and Lipitor and Eliquis. Will hold aspirin 81.  Once med reconciliation is available we will decide if patient needs triple therapy with aspirin Plavix and Eliquis.

## 2022-08-31 NOTE — Progress Notes (Signed)
Notified provider of need to order repeat lactic acid. ° °

## 2022-08-31 NOTE — Assessment & Plan Note (Signed)
Continue patient on Plavix Lipitor and Eliquis.

## 2022-08-31 NOTE — Progress Notes (Signed)
Elink monitoring for the code sepsis protocol.  

## 2022-08-31 NOTE — ED Notes (Signed)
Patient transported to CT 

## 2022-08-31 NOTE — Progress Notes (Signed)
Pharmacy Antibiotic Note  [name] is a 82 y.o. male admitted on 08/31/2022 presenting found down, concern for sepsis.  Pharmacy has been consulted for vancomycin and cefepime dosing.  Plan: Vancomycin 1500 mg IV x 1, then 750 mg IV q 12h (eAUC 501) Add MRSA PCR Cefepime 2g IV every 12h Monitor renal function, Cx/PCR and clinical progression to narrow Vancomycin levels as indicated  Height: 5\' 11"  (180.3 cm) Weight: 77.1 kg (170 lb) IBW/kg (Calculated) : 75.3  Temp (24hrs), Avg:97.6 F (36.4 C), Min:97.6 F (36.4 C), Max:97.6 F (36.4 C)  Recent Labs  Lab 08/31/22 2020 08/31/22 2122  WBC 9.2  --   CREATININE  --  1.00  LATICACIDVEN 2.3*  --     Estimated Creatinine Clearance: 59.6 mL/min (by C-G formula based on SCr of 1 mg/dL).    Allergies  Allergen Reactions   Adhesive [Tape] Other (See Comments)    Takes skin off    Codeine Sulfate Itching and Other (See Comments)    headaches   Hydrocodone    Hydrocodone-Acetaminophen Other (See Comments)   Other Other (See Comments)    Other reaction(s): NAUSEA,VOMITING, Headache   Wound Dressing Adhesive     Other reaction(s): Skin irritation    Daylene Posey, PharmD, [facility] Clinical Pharmacist ED Pharmacist Phone # [phone] 08/31/2022 9:48 PM

## 2022-08-31 NOTE — ED Notes (Signed)
Pt returned from CT °

## 2022-08-31 NOTE — Assessment & Plan Note (Signed)
Patient has a history of TIA, currently exam is nonfocal. Patient is awake alert cooperative but confused suspect metabolic encephalopathy and also secondary to sepsis. Pt has h/o TIA will consider MRI in am once stable.

## 2022-08-31 NOTE — Assessment & Plan Note (Signed)
Compensated, stable. SpO2: 95 % Nicotine patch. PRN Albuterol.

## 2022-09-01 ENCOUNTER — Other Ambulatory Visit (HOSPITAL_COMMUNITY): Payer: Medicare Other

## 2022-09-01 ENCOUNTER — Inpatient Hospital Stay (HOSPITAL_COMMUNITY): Payer: Medicare Other

## 2022-09-01 DIAGNOSIS — Z7901 Long term (current) use of anticoagulants: Secondary | ICD-10-CM | POA: Diagnosis not present

## 2022-09-01 DIAGNOSIS — Y658 Other specified misadventures during surgical and medical care: Secondary | ICD-10-CM | POA: Diagnosis not present

## 2022-09-01 DIAGNOSIS — R4182 Altered mental status, unspecified: Secondary | ICD-10-CM | POA: Diagnosis present

## 2022-09-01 DIAGNOSIS — I480 Paroxysmal atrial fibrillation: Secondary | ICD-10-CM

## 2022-09-01 DIAGNOSIS — R079 Chest pain, unspecified: Secondary | ICD-10-CM | POA: Diagnosis not present

## 2022-09-01 DIAGNOSIS — M6282 Rhabdomyolysis: Secondary | ICD-10-CM | POA: Diagnosis present

## 2022-09-01 DIAGNOSIS — I509 Heart failure, unspecified: Secondary | ICD-10-CM | POA: Diagnosis not present

## 2022-09-01 DIAGNOSIS — W19XXXA Unspecified fall, initial encounter: Secondary | ICD-10-CM | POA: Diagnosis not present

## 2022-09-01 DIAGNOSIS — I255 Ischemic cardiomyopathy: Secondary | ICD-10-CM | POA: Diagnosis not present

## 2022-09-01 DIAGNOSIS — Z515 Encounter for palliative care: Secondary | ICD-10-CM | POA: Diagnosis not present

## 2022-09-01 DIAGNOSIS — S12110A Anterior displaced Type II dens fracture, initial encounter for closed fracture: Secondary | ICD-10-CM | POA: Diagnosis present

## 2022-09-01 DIAGNOSIS — R109 Unspecified abdominal pain: Secondary | ICD-10-CM | POA: Diagnosis not present

## 2022-09-01 DIAGNOSIS — Y92000 Kitchen of unspecified non-institutional (private) residence as  the place of occurrence of the external cause: Secondary | ICD-10-CM | POA: Diagnosis not present

## 2022-09-01 DIAGNOSIS — M25552 Pain in left hip: Secondary | ICD-10-CM | POA: Diagnosis not present

## 2022-09-01 DIAGNOSIS — L89152 Pressure ulcer of sacral region, stage 2: Secondary | ICD-10-CM | POA: Diagnosis present

## 2022-09-01 DIAGNOSIS — J439 Emphysema, unspecified: Secondary | ICD-10-CM | POA: Diagnosis present

## 2022-09-01 DIAGNOSIS — N390 Urinary tract infection, site not specified: Secondary | ICD-10-CM | POA: Diagnosis present

## 2022-09-01 DIAGNOSIS — S12100S Unspecified displaced fracture of second cervical vertebra, sequela: Secondary | ICD-10-CM | POA: Diagnosis not present

## 2022-09-01 DIAGNOSIS — D72829 Elevated white blood cell count, unspecified: Secondary | ICD-10-CM | POA: Diagnosis not present

## 2022-09-01 DIAGNOSIS — E870 Hyperosmolality and hypernatremia: Secondary | ICD-10-CM | POA: Diagnosis present

## 2022-09-01 DIAGNOSIS — R400 Somnolence: Secondary | ICD-10-CM | POA: Diagnosis not present

## 2022-09-01 DIAGNOSIS — C61 Malignant neoplasm of prostate: Secondary | ICD-10-CM | POA: Diagnosis present

## 2022-09-01 DIAGNOSIS — J9601 Acute respiratory failure with hypoxia: Secondary | ICD-10-CM | POA: Diagnosis not present

## 2022-09-01 DIAGNOSIS — I5023 Acute on chronic systolic (congestive) heart failure: Secondary | ICD-10-CM | POA: Diagnosis present

## 2022-09-01 DIAGNOSIS — S065X9A Traumatic subdural hemorrhage with loss of consciousness of unspecified duration, initial encounter: Secondary | ICD-10-CM | POA: Diagnosis not present

## 2022-09-01 DIAGNOSIS — T8383XD Hemorrhage of genitourinary prosthetic devices, implants and grafts, subsequent encounter: Secondary | ICD-10-CM | POA: Diagnosis not present

## 2022-09-01 DIAGNOSIS — Z66 Do not resuscitate: Secondary | ICD-10-CM | POA: Diagnosis present

## 2022-09-01 DIAGNOSIS — T8383XA Hemorrhage of genitourinary prosthetic devices, implants and grafts, initial encounter: Secondary | ICD-10-CM | POA: Diagnosis not present

## 2022-09-01 DIAGNOSIS — A419 Sepsis, unspecified organism: Secondary | ICD-10-CM | POA: Diagnosis not present

## 2022-09-01 DIAGNOSIS — J449 Chronic obstructive pulmonary disease, unspecified: Secondary | ICD-10-CM | POA: Diagnosis present

## 2022-09-01 DIAGNOSIS — R7989 Other specified abnormal findings of blood chemistry: Secondary | ICD-10-CM | POA: Diagnosis not present

## 2022-09-01 DIAGNOSIS — N179 Acute kidney failure, unspecified: Secondary | ICD-10-CM | POA: Diagnosis present

## 2022-09-01 DIAGNOSIS — R569 Unspecified convulsions: Secondary | ICD-10-CM | POA: Diagnosis not present

## 2022-09-01 DIAGNOSIS — R0602 Shortness of breath: Secondary | ICD-10-CM | POA: Diagnosis not present

## 2022-09-01 DIAGNOSIS — G9340 Encephalopathy, unspecified: Secondary | ICD-10-CM | POA: Diagnosis present

## 2022-09-01 DIAGNOSIS — I6200 Nontraumatic subdural hemorrhage, unspecified: Secondary | ICD-10-CM | POA: Diagnosis not present

## 2022-09-01 DIAGNOSIS — I739 Peripheral vascular disease, unspecified: Secondary | ICD-10-CM | POA: Diagnosis present

## 2022-09-01 DIAGNOSIS — R627 Adult failure to thrive: Secondary | ICD-10-CM | POA: Diagnosis not present

## 2022-09-01 DIAGNOSIS — W1830XA Fall on same level, unspecified, initial encounter: Secondary | ICD-10-CM | POA: Diagnosis present

## 2022-09-01 DIAGNOSIS — S22071A Stable burst fracture of T9-T10 vertebra, initial encounter for closed fracture: Secondary | ICD-10-CM | POA: Diagnosis present

## 2022-09-01 DIAGNOSIS — J690 Pneumonitis due to inhalation of food and vomit: Secondary | ICD-10-CM | POA: Diagnosis present

## 2022-09-01 DIAGNOSIS — E872 Acidosis, unspecified: Secondary | ICD-10-CM | POA: Diagnosis present

## 2022-09-01 DIAGNOSIS — I482 Chronic atrial fibrillation, unspecified: Secondary | ICD-10-CM | POA: Diagnosis present

## 2022-09-01 DIAGNOSIS — Z9861 Coronary angioplasty status: Secondary | ICD-10-CM | POA: Diagnosis not present

## 2022-09-01 DIAGNOSIS — S12112A Nondisplaced Type II dens fracture, initial encounter for closed fracture: Secondary | ICD-10-CM | POA: Diagnosis not present

## 2022-09-01 DIAGNOSIS — R310 Gross hematuria: Secondary | ICD-10-CM | POA: Diagnosis not present

## 2022-09-01 DIAGNOSIS — S12100A Unspecified displaced fracture of second cervical vertebra, initial encounter for closed fracture: Secondary | ICD-10-CM | POA: Diagnosis not present

## 2022-09-01 DIAGNOSIS — Z7189 Other specified counseling: Secondary | ICD-10-CM | POA: Diagnosis not present

## 2022-09-01 DIAGNOSIS — I2510 Atherosclerotic heart disease of native coronary artery without angina pectoris: Secondary | ICD-10-CM | POA: Diagnosis not present

## 2022-09-01 DIAGNOSIS — E440 Moderate protein-calorie malnutrition: Secondary | ICD-10-CM | POA: Diagnosis present

## 2022-09-01 DIAGNOSIS — F0390 Unspecified dementia without behavioral disturbance: Secondary | ICD-10-CM | POA: Diagnosis present

## 2022-09-01 DIAGNOSIS — R0902 Hypoxemia: Secondary | ICD-10-CM | POA: Diagnosis not present

## 2022-09-01 DIAGNOSIS — R918 Other nonspecific abnormal finding of lung field: Secondary | ICD-10-CM | POA: Diagnosis not present

## 2022-09-01 DIAGNOSIS — G9341 Metabolic encephalopathy: Secondary | ICD-10-CM | POA: Diagnosis present

## 2022-09-01 DIAGNOSIS — Z452 Encounter for adjustment and management of vascular access device: Secondary | ICD-10-CM | POA: Diagnosis not present

## 2022-09-01 LAB — COMPREHENSIVE METABOLIC PANEL
ALT: 43 U/L (ref 0–44)
AST: 93 U/L — ABNORMAL HIGH (ref 15–41)
Albumin: 2.4 g/dL — ABNORMAL LOW (ref 3.5–5.0)
Alkaline Phosphatase: 43 U/L (ref 38–126)
Anion gap: 12 (ref 5–15)
BUN: 31 mg/dL — ABNORMAL HIGH (ref 8–23)
CO2: 22 mmol/L (ref 22–32)
Calcium: 8.8 mg/dL — ABNORMAL LOW (ref 8.9–10.3)
Chloride: 107 mmol/L (ref 98–111)
Creatinine, Ser: 1.09 mg/dL (ref 0.61–1.24)
GFR, Estimated: >60 mL/min (ref >60)
Glucose, Bld: 142 mg/dL — ABNORMAL HIGH (ref 70–99)
Potassium: 3.2 mmol/L — ABNORMAL LOW (ref 3.5–5.1)
Sodium: 141 mmol/L (ref 135–145)
Total Bilirubin: 1.3 mg/dL — ABNORMAL HIGH (ref 0.3–1.2)
Total Protein: 4.5 g/dL — ABNORMAL LOW (ref 6.5–8.1)

## 2022-09-01 LAB — URINALYSIS, W/ REFLEX TO CULTURE (INFECTION SUSPECTED)
Bacteria, UA: NONE SEEN
Bilirubin Urine: NEGATIVE
Glucose, UA: NEGATIVE mg/dL
Ketones, ur: 5 mg/dL — AB
Nitrite: NEGATIVE
Protein, ur: 30 mg/dL — AB
Specific Gravity, Urine: 1.03 (ref 1.005–1.030)
pH: 5 (ref 5.0–8.0)

## 2022-09-01 LAB — CBC
HCT: 35 % — ABNORMAL LOW (ref 39.0–52.0)
Hemoglobin: 11.8 g/dL — ABNORMAL LOW (ref 13.0–17.0)
MCH: 33 pg (ref 26.0–34.0)
MCHC: 33.7 g/dL (ref 30.0–36.0)
MCV: 97.8 fL (ref 80.0–100.0)
Platelets: 246 10*3/uL (ref 150–400)
RBC: 3.58 MIL/uL — ABNORMAL LOW (ref 4.22–5.81)
RDW: 12.6 % (ref 11.5–15.5)
WBC: 10.1 10*3/uL (ref 4.0–10.5)
nRBC: 0 % (ref 0.0–0.2)

## 2022-09-01 LAB — MAGNESIUM: Magnesium: 1.9 mg/dL (ref 1.7–2.4)

## 2022-09-01 LAB — APTT: aPTT: 43 seconds — ABNORMAL HIGH (ref 24–36)

## 2022-09-01 LAB — URINE CULTURE

## 2022-09-01 LAB — LACTIC ACID, PLASMA
Lactic Acid, Venous: 3.6 mmol/L (ref 0.5–1.9)
Lactic Acid, Venous: 1.9 mmol/L (ref 0.5–1.9)

## 2022-09-01 LAB — T4, FREE: Free T4: 1.2 ng/dL — ABNORMAL HIGH (ref 0.61–1.12)

## 2022-09-01 LAB — CULTURE, BLOOD (ROUTINE X 2)

## 2022-09-01 LAB — TROPONIN I (HIGH SENSITIVITY): Troponin I (High Sensitivity): 220 ng/L (ref <18)

## 2022-09-01 LAB — MRSA NEXT GEN BY PCR, NASAL: MRSA by PCR Next Gen: NOT DETECTED

## 2022-09-01 LAB — TSH: TSH: 0.445 u[IU]/mL (ref 0.350–4.500)

## 2022-09-01 MED ORDER — HYDRALAZINE HCL 20 MG/ML IJ SOLN: 5.0000 mg | INTRAMUSCULAR | Status: DC | PRN

## 2022-09-01 MED ORDER — HEPARIN (PORCINE) 25000 UT/250ML-% IV SOLN
900.0000 [IU]/h | INTRAVENOUS | Status: DC
  Administered 2022-09-01: 900 [IU]/h via INTRAVENOUS
  Filled 2022-09-01: qty 250

## 2022-09-01 MED ORDER — CHLORHEXIDINE GLUCONATE CLOTH 2 % EX PADS
6.0000 | MEDICATED_PAD | Freq: Every day | CUTANEOUS | Status: DC
  Administered 2022-09-01 – 2022-09-10 (×11): 6 via TOPICAL

## 2022-09-01 MED ORDER — METOPROLOL SUCCINATE ER 50 MG PO TB24
50.0000 mg | ORAL_TABLET | Freq: Every day | ORAL | Status: DC
  Administered 2022-09-01: 50 mg via ORAL
  Filled 2022-09-01: qty 1

## 2022-09-01 MED ORDER — ACETAMINOPHEN 325 MG PO TABS
650.0000 mg | ORAL_TABLET | Freq: Four times a day (QID) | ORAL | Status: DC | PRN
  Administered 2022-09-01 – 2022-09-03 (×5): 650 mg via ORAL
  Filled 2022-09-01 (×5): qty 2

## 2022-09-01 MED ORDER — HEPARIN (PORCINE) 25000 UT/250ML-% IV SOLN
1050.0000 [IU]/h | INTRAVENOUS | Status: DC
  Administered 2022-09-02: 1050 [IU]/h via INTRAVENOUS
  Filled 2022-09-01: qty 250

## 2022-09-01 MED ORDER — THIAMINE HCL 100 MG/ML IJ SOLN
100.0000 mg | Freq: Every day | INTRAMUSCULAR | Status: DC
  Administered 2022-09-01 – 2022-09-10 (×10): 100 mg via INTRAVENOUS
  Filled 2022-09-01 (×10): qty 2

## 2022-09-01 MED ORDER — MAGNESIUM OXIDE -MG SUPPLEMENT 400 (240 MG) MG PO TABS
400.0000 mg | ORAL_TABLET | Freq: Two times a day (BID) | ORAL | Status: DC
  Administered 2022-09-01 – 2022-09-05 (×10): 400 mg via ORAL
  Filled 2022-09-01 (×10): qty 1

## 2022-09-01 MED ORDER — NICOTINE 14 MG/24HR TD PT24
14.0000 mg | MEDICATED_PATCH | Freq: Every day | TRANSDERMAL | Status: DC
  Administered 2022-09-01 – 2022-09-10 (×10): 14 mg via TRANSDERMAL
  Filled 2022-09-01 (×10): qty 1

## 2022-09-01 MED ORDER — METOPROLOL TARTRATE 5 MG/5ML IV SOLN
2.5000 mg | INTRAVENOUS | Status: DC
  Administered 2022-09-01 (×2): 2.5 mg via INTRAVENOUS
  Filled 2022-09-01 (×2): qty 5

## 2022-09-01 MED ORDER — SODIUM CHLORIDE 0.9% FLUSH
3.0000 mL | Freq: Two times a day (BID) | INTRAVENOUS | Status: DC
  Administered 2022-09-01 – 2022-09-08 (×12): 3 mL via INTRAVENOUS

## 2022-09-01 NOTE — Assessment & Plan Note (Addendum)
Patient in A-fib RVR. Chronically has a history of A-fib and is managed with Eliquis. EKG shown below.   Cardiology consult secure chat sent / page to dr. Raynelle Jan.

## 2022-09-01 NOTE — Consult Note (Addendum)
Reason for Consult: Type II odontoid fracture Referring Physician: Dr. Paula Libra [name] is an 82 y.o. male.  HPI: The patient is an 82 year old white male with multiple medical problems who by report was found lying on the floor with his wife covered in stool and urine.  He was brought to [facility] and was worked up with a head CT which was unremarkable and a cervical CT which demonstrated a type II odontoid fracture.  A neurosurgical consultation was requested.  Presently the patient is alert and pleasant.  He is mildly confused.  He is wearing a hard cervical collar.  He complains of neck pain.  By report he was on aspirin, Plavix and Eliquis prior to admission.  Past Medical History:  Diagnosis Date   ALLERGIC RHINITIS    Atrial flutter (HCC)    CAD (coronary artery disease)    CAD s/p CABG 1993 and redo 2016,   Carotid artery disease (HCC)    bilateral   Chronic combined systolic and diastolic CHF (congestive heart failure) (HCC)    CKD (chronic kidney disease), stage II    COPD (chronic obstructive pulmonary disease) (HCC)    Diverticulosis    Hyperlipidemia    Osteoarthritis    PAF (paroxysmal atrial fibrillation) (HCC)    Peripheral neuropathy    Personal history of prostate cancer    PVD (peripheral vascular disease) with claudication (HCC)    Radiation proctitis    STEMI (ST elevation myocardial infarction) (HCC) 04/29/2014   PTCA Lmain and CFX, in setting of VF/VT arrest and CGS   Tobacco use disorder, continuous    Ventral hernia     Past Surgical History:  Procedure Laterality Date   APPENDECTOMY     CARDIOVERSION N/A 06/24/2014   Procedure: CARDIOVERSION;  Surgeon: Chrystie Nose, MD;  Location: [facility] ENDOSCOPY;  Service: Cardiovascular;  Laterality: N/A;   CORONARY ARTERY BYPASS GRAFT  1992   CORONARY ARTERY BYPASS GRAFT N/A 05/28/2014   Procedure: REDO CORONARY ARTERY BYPASS GRAFTING (CABG);  Surgeon: Delight Ovens, MD;  Location: [facility] OR;   Service: Open Heart Surgery;  Laterality: N/A;  Times 4 using right internal mammary artery to Intermediate artery and endoscopically harvested left saphenous vein to LAD, OM1, and PD coronary arteries.   LEFT HEART CATHETERIZATION WITH CORONARY ANGIOGRAM N/A 04/30/2014   Procedure: LEFT HEART CATHETERIZATION WITH CORONARY ANGIOGRAM;  Surgeon: Peter M Swaziland, MD; Lmain 90% (s/p PTCA), LAD 80%, CFX 100% (s/p PTCA), RCA OK, LIMA-LAD atretic, SVG-OM 100% (chronic), EF 45%, pt req defib x 13 for VF/VT arrest, CHB w/ tem pacer, IABP and intubation   LUMBAR LAMINECTOMY  [id number]    X2   PROSTATECTOMY  05/2008   Dr. Laverle Patter and XRT   TEE WITHOUT CARDIOVERSION N/A 05/28/2014   Procedure: TRANSESOPHAGEAL ECHOCARDIOGRAM (TEE);  Surgeon: Delight Ovens, MD;  Location: [facility] OR;  Service: Open Heart Surgery;  Laterality: N/A;   TEE WITHOUT CARDIOVERSION N/A 06/24/2014   Procedure: TRANSESOPHAGEAL ECHOCARDIOGRAM (TEE);  Surgeon: Chrystie Nose, MD;  Location: [facility] ENDOSCOPY;  Service: Cardiovascular;  Laterality: N/A;   TONSILLECTOMY      Family History  Problem Relation Age of Onset   Heart disease Father    Coronary artery disease Father    Lung cancer Sister    Heart disease Brother    Hypertension Unknown     Social History:  reports that he has been smoking cigarettes. He has never used smokeless tobacco. He reports that he  does not drink alcohol and does not use drugs.  Allergies:  Allergies  Allergen Reactions   Adhesive [Tape] Other (See Comments)    Takes skin off    Codeine Sulfate Itching and Other (See Comments)    headaches   Hydrocodone    Hydrocodone-Acetaminophen Other (See Comments)   Other Other (See Comments)    Other reaction(s): NAUSEA,VOMITING, Headache   Wound Dressing Adhesive     Other reaction(s): Skin irritation    Medications: I have reviewed the patient's current medications. Prior to Admission:  Medications Prior to Admission  Medication Sig Dispense Refill Last  Dose   aspirin EC 81 MG tablet Take 1 tablet (81 mg total) by mouth daily. 90 tablet 3    atorvastatin (LIPITOR) 20 MG tablet Take 1 tablet (20 mg total) by mouth daily. 90 tablet 3    clopidogrel (PLAVIX) 300 MG TABS tablet Take 1 tablet by mouth daily.      doxycycline (VIBRA-TABS) 100 MG tablet Take 1 tablet (100 mg total) by mouth 2 (two) times daily. 28 tablet 0    ELIQUIS 5 MG TABS tablet TAKE 1 TABLET TWICE A DAY 180 tablet 3    fluticasone (FLONASE) 50 MCG/ACT nasal spray Place 1 spray into both nostrils daily as needed for allergies or rhinitis. 48 g 0    folic acid (FOLVITE) 1 MG tablet TAKE 1 TABLET DAILY 90 tablet 3    KLOR-CON 8 MEQ tablet TAKE 1 TABLET DAILY (ANNUAL APPOINTMENT DUE WITH LABS, MUST SEE PROVIDER FOR FUTURE REFILLS) 90 tablet 3    loratadine (CLARITIN) 10 MG tablet Take 10 mg by mouth daily.      LORazepam (ATIVAN) 0.5 MG tablet Take 0.5 mg by mouth daily as needed.      metoprolol succinate (TOPROL-XL) 50 MG 24 hr tablet TAKE 1 TABLET DAILY 30 tablet 0    nitroGLYCERIN (NITROSTAT) 0.4 MG SL tablet Place 0.4 mg under the tongue as needed. Chest pain      pantoprazole (PROTONIX) 40 MG tablet Take 1 tablet (40 mg total) by mouth daily. 90 tablet 3    torsemide (DEMADEX) 20 MG tablet Take 1-2 tablets (20-40 mg total) by mouth daily. 180 tablet 3    traMADol (ULTRAM) 50 MG tablet Take 1 tablet (50 mg total) by mouth every 6 (six) hours as needed for severe pain. Pain 28 tablet 0    traZODone (DESYREL) 50 MG tablet Take 1 tablet by mouth at bedtime.      zolpidem (AMBIEN) 5 MG tablet Take 1 tablet (5 mg total) by mouth at bedtime. 90 tablet 1    Scheduled:  Chlorhexidine Gluconate Cloth  6 each Topical Daily   metoprolol tartrate  2.5 mg Intravenous Q4H   nicotine  14 mg Transdermal Daily   sodium chloride flush  3 mL Intravenous Q12H   thiamine (VITAMIN B1) injection  100 mg Intravenous Daily   Continuous:  ceFEPime (MAXIPIME) IV 2 g (09/01/22 0826)   lactated  ringers 100 mL/hr at 09/01/22 0559   vancomycin     [id number] Anti-infectives (From admission, onward)    Start     Dose/Rate Route Frequency Ordered Stop   09/01/22 1000  ceFEPIme (MAXIPIME) 2 g in sodium chloride 0.9 % 100 mL IVPB        2 g 200 mL/hr over 30 Minutes Intravenous Every 12 hours 08/31/22 2153     09/01/22 1000  vancomycin (VANCOREADY) IVPB 750 mg/150 mL  750 mg 150 mL/hr over 60 Minutes Intravenous Every 12 hours 08/31/22 2153     08/31/22 2045  ceFEPIme (MAXIPIME) 2 g in sodium chloride 0.9 % 100 mL IVPB        2 g 200 mL/hr over 30 Minutes Intravenous  Once 08/31/22 2043 08/31/22 2138   08/31/22 2045  metroNIDAZOLE (FLAGYL) IVPB 500 mg        500 mg 100 mL/hr over 60 Minutes Intravenous  Once 08/31/22 2043 08/31/22 2239   08/31/22 2045  vancomycin (VANCOCIN) IVPB 1000 mg/200 mL premix  Status:  Discontinued        1,000 mg 200 mL/hr over 60 Minutes Intravenous  Once 08/31/22 2043 08/31/22 2044   08/31/22 2045  vancomycin (VANCOREADY) IVPB 1500 mg/300 mL        1,500 mg 150 mL/hr over 120 Minutes Intravenous  Once 08/31/22 2044 09/01/22 0012        Results for orders placed or performed during the hospital encounter of 08/31/22 (from the past 48 hour(s))  Lactic acid, plasma     Status: Abnormal   Collection Time: 08/31/22  8:20 PM  Result Value Ref Range   Lactic Acid, Venous 2.3 (HH) 0.5 - 1.9 mmol/L    Comment: CRITICAL RESULT CALLED TO, READ BACK BY AND VERIFIED WITH W. HICKS RN 08/31/22 @2140  BY J. WHITE Performed at [facility], [address], [place], [place] [address]   Comprehensive metabolic panel     Status: Abnormal   Collection Time: 08/31/22  8:20 PM  Result Value Ref Range   Sodium 141 135 - 145 mmol/L   Potassium 3.3 (L) 3.5 - 5.1 mmol/L   Chloride 104 98 - 111 mmol/L   CO2 22 22 - 32 mmol/L   Glucose, Bld 161 (H) 70 - 99 mg/dL    Comment: Glucose reference range applies only to samples taken after fasting for at least  8 hours.   BUN 35 (H) 8 - 23 mg/dL   Creatinine, Ser 6.04 (H) 0.61 - 1.24 mg/dL   Calcium 9.6 8.9 - 54.0 mg/dL   Total Protein 5.6 (L) 6.5 - 8.1 g/dL   Albumin 3.0 (L) 3.5 - 5.0 g/dL   AST 981 (H) 15 - 41 U/L   ALT 51 (H) 0 - 44 U/L   Alkaline Phosphatase 55 38 - 126 U/L   Total Bilirubin 1.5 (H) 0.3 - 1.2 mg/dL   GFR, Estimated 54 (L) >60 mL/min    Comment: (NOTE) Calculated using the CKD-EPI Creatinine Equation (2021)    Anion gap 15 5 - 15    Comment: Performed at [facility], [address] [place], [place] [address]  CBC with Differential     Status: Abnormal   Collection Time: 08/31/22  8:20 PM  Result Value Ref Range   WBC 9.2 4.0 - 10.5 K/uL   RBC 4.13 (L) 4.22 - 5.81 MIL/uL   Hemoglobin 14.0 13.0 - 17.0 g/dL   HCT 82.9 56.2 - 13.0 %   MCV 96.9 80.0 - 100.0 fL   MCH 33.9 26.0 - 34.0 pg   MCHC 35.0 30.0 - 36.0 g/dL   RDW 86.5 78.4 - 69.6 %   Platelets 321 150 - 400 K/uL   nRBC 0.0 0.0 - 0.2 %   Neutrophils Relative % 86 %   Neutro Abs 8.0 (H) 1.7 - 7.7 K/uL   Lymphocytes Relative 3 %   Lymphs Abs 0.3 (L) 0.7 - 4.0 K/uL  Monocytes Relative 10 %   Monocytes Absolute 0.9 0.1 - 1.0 K/uL   Eosinophils Relative 0 %   Eosinophils Absolute 0.0 0.0 - 0.5 K/uL   Basophils Relative 0 %   Basophils Absolute 0.0 0.0 - 0.1 K/uL   Immature Granulocytes 1 %   Abs Immature Granulocytes 0.05 0.00 - 0.07 K/uL    Comment: Performed at [facility], [address], [place], [place] [address]  Protime-INR     Status: Abnormal   Collection Time: 08/31/22  8:20 PM  Result Value Ref Range   Prothrombin Time 16.5 (H) 11.4 - 15.2 seconds   INR 1.3 (H) 0.8 - 1.2    Comment: (NOTE) INR goal varies based on device and disease states. Performed at [facility], [address] [place], [place] [address]   APTT     Status: None   Collection Time: 08/31/22  8:20 PM  Result Value Ref Range   aPTT 30 24 - 36 seconds    Comment: Performed at [facility],  [address] [place], [place] [address]  Blood Culture (routine x 2)     Status: None (Preliminary result)   Collection Time: 08/31/22  8:20 PM   Specimen: BLOOD  Result Value Ref Range   Specimen Description BLOOD LEFT ANTECUBITAL    Special Requests      BOTTLES DRAWN AEROBIC AND ANAEROBIC Blood Culture adequate volume   Culture      NO GROWTH < 12 HOURS Performed at [facility], [address], [place], [place] [address]    Report Status PENDING   Blood Culture (routine x 2)     Status: None (Preliminary result)   Collection Time: 08/31/22  8:20 PM   Specimen: BLOOD  Result Value Ref Range   Specimen Description BLOOD SITE NOT SPECIFIED    Special Requests      BOTTLES DRAWN AEROBIC AND ANAEROBIC Blood Culture results may not be optimal due to an excessive volume of blood received in culture bottles   Culture      NO GROWTH < 12 HOURS Performed at [facility], [address] [place], [place] [address]    Report Status PENDING   .Cooxemetry Panel (carboxy, met, total hgb, O2 sat)(NOT AT [facility] or [facility])     Status: Abnormal   Collection Time: 08/31/22  8:20 PM  Result Value Ref Range   Total hemoglobin 14.4 12.0 - 16.0 g/dL   O2 Saturation 13.0 %   Carboxyhemoglobin 2.0 (H) 0.5 - 1.5 %   Methemoglobin 0.9 0.0 - 1.5 %    Comment: Performed at [facility], [address], [place], [place] [address]  Troponin I (High Sensitivity)     Status: Abnormal   Collection Time: 08/31/22  8:20 PM  Result Value Ref Range   Troponin I (High Sensitivity) 208 (HH) <18 ng/L    Comment: CRITICAL RESULT CALLED TO, READ BACK BY AND VERIFIED WITH F. JONES RN 08/31/22 @2151  BY J. WHITE (NOTE) Elevated high sensitivity troponin I (hsTnI) values and significant  changes across serial measurements may suggest ACS but many other  chronic and acute conditions are known to elevate hsTnI results.  Refer to the "Links" section for chest pain algorithms and additional  guidance. Performed  at [facility], [address] [place], [place] [address]   CK     Status: Abnormal   Collection Time: 08/31/22  8:20 PM  Result Value Ref Range   Total CK 4,821 (H) 49 - 397 U/L    Comment: RESULT CONFIRMED BY MANUAL DILUTION Performed at [facility], [address], [place], [place] [address]   Urinalysis, w/ Reflex to Culture (Infection Suspected) -Urine, Clean Catch     Status: Abnormal   Collection Time: 08/31/22  8:50 PM  Result Value Ref Range   Specimen Source URINE, CLEAN CATCH    Color, Urine YELLOW YELLOW   APPearance HAZY (A) CLEAR   Specific Gravity, Urine 1.016 1.005 - 1.030   pH 7.0 5.0 - 8.0   Glucose, UA NEGATIVE NEGATIVE mg/dL   Hgb urine dipstick MODERATE (A) NEGATIVE   Bilirubin Urine NEGATIVE NEGATIVE   Ketones, ur 20 (A) NEGATIVE mg/dL   Protein, ur 213 (A) NEGATIVE mg/dL   Nitrite POSITIVE (A) NEGATIVE   Leukocytes,Ua NEGATIVE NEGATIVE   RBC / HPF 0-5 0 - 5 RBC/hpf   WBC, UA 0-5 0 - 5 WBC/hpf    Comment:        Reflex urine culture not performed if WBC <=10, OR if Squamous epithelial cells >5. If Squamous epithelial cells >5 suggest recollection.    Bacteria, UA MANY (A) NONE SEEN   Squamous Epithelial / HPF 0-5 0 - 5 /HPF   Mucus PRESENT    Hyaline Casts, UA PRESENT     Comment: Performed at [facility], [address], [place], [place] [address]  I-Stat Chem 8, ED     Status: Abnormal   Collection Time: 08/31/22  9:22 PM  Result Value Ref Range   Sodium 144 135 - 145 mmol/L   Potassium 3.3 (L) 3.5 - 5.1 mmol/L   Chloride 106 98 - 111 mmol/L   BUN 33 (H) 8 - 23 mg/dL   Creatinine, Ser 8.46 0.61 - 1.24 mg/dL   Glucose, Bld 962 (H) 70 - 99 mg/dL    Comment: Glucose reference range applies only to samples taken after fasting for at least 8 hours.   Calcium, Ion 1.29 1.15 - 1.40 mmol/L   TCO2 23 22 - 32 mmol/L   Hemoglobin 13.3 13.0 - 17.0 g/dL   HCT 95.2 84.1 - 32.4 %  Troponin I (High Sensitivity)     Status: Abnormal    Collection Time: 08/31/22 10:15 PM  Result Value Ref Range   Troponin I (High Sensitivity) 210 (HH) <18 ng/L    Comment: CRITICAL VALUE NOTED. VALUE IS CONSISTENT WITH PREVIOUSLY REPORTED/CALLED VALUE (NOTE) Elevated high sensitivity troponin I (hsTnI) values and significant  changes across serial measurements may suggest ACS but many other  chronic and acute conditions are known to elevate hsTnI results.  Refer to the "Links" section for chest pain algorithms and additional  guidance. Performed at [facility], [address], [place], [place] [address]   Lactic acid, plasma     Status: Abnormal   Collection Time: 08/31/22 10:45 PM  Result Value Ref Range   Lactic Acid, Venous 2.6 (HH) 0.5 - 1.9 mmol/L    Comment: CRITICAL VALUE NOTED. VALUE IS CONSISTENT WITH PREVIOUSLY REPORTED/CALLED VALUE Performed at [facility], [address] [place], [place] [address]   Lactic acid, plasma     Status: Abnormal   Collection Time: 09/01/22  1:31 AM  Result Value Ref Range   Lactic Acid, Venous 3.6 (HH) 0.5 - 1.9 mmol/L    Comment: CRITICAL VALUE NOTED. VALUE IS CONSISTENT WITH PREVIOUSLY REPORTED/CALLED VALUE Performed at [facility], [address] [place], [place] [address]   Magnesium     Status: None   Collection Time: 09/01/22  1:31 AM  Result Value Ref Range   Magnesium 1.9 1.7 - 2.4 mg/dL    Comment: Performed at [facility], [address] [place], [place] [address]  TSH     Status: None   Collection Time: 09/01/22  1:31 AM  Result Value Ref Range   TSH 0.445 0.350 - 4.500 uIU/mL    Comment: Performed by a 3rd Generation assay with a functional sensitivity of <=0.01 uIU/mL. Performed at [facility], [address], [place], [place] [address]   Comprehensive metabolic panel     Status: Abnormal   Collection Time: 09/01/22  1:31 AM  Result Value Ref Range   Sodium 141 135 - 145 mmol/L   Potassium 3.2 (L) 3.5 - 5.1 mmol/L   Chloride 107 98 -  111 mmol/L   CO2 22 22 - 32 mmol/L   Glucose, Bld 142 (H) 70 - 99 mg/dL    Comment: Glucose reference range applies only to samples taken after fasting for at least 8 hours.   BUN 31 (H) 8 - 23 mg/dL   Creatinine, Ser 9.14 0.61 - 1.24 mg/dL   Calcium 8.8 (L) 8.9 - 10.3 mg/dL   Total Protein 4.5 (L) 6.5 - 8.1 g/dL   Albumin 2.4 (L) 3.5 - 5.0 g/dL   AST 93 (H) 15 - 41 U/L   ALT 43 0 - 44 U/L   Alkaline Phosphatase 43 38 - 126 U/L   Total Bilirubin 1.3 (H) 0.3 - 1.2 mg/dL   GFR, Estimated >78 >29 mL/min    Comment: (NOTE) Calculated using the CKD-EPI Creatinine Equation (2021)    Anion gap 12 5 - 15    Comment: Performed at [facility], [address] [place], [place] [address]  CBC     Status: Abnormal   Collection Time: 09/01/22  1:31 AM  Result Value Ref Range   WBC 10.1 4.0 - 10.5 K/uL   RBC 3.58 (L) 4.22 - 5.81 MIL/uL   Hemoglobin 11.8 (L) 13.0 - 17.0 g/dL   HCT 08.6 (L) 57.8 - 46.9 %   MCV 97.8 80.0 - 100.0 fL   MCH 33.0 26.0 - 34.0 pg   MCHC 33.7 30.0 - 36.0 g/dL   RDW 62.9 52.8 - 41.3 %   Platelets 246 150 - 400 K/uL   nRBC 0.0 0.0 - 0.2 %    Comment: Performed at [facility], [address] [place], [place] [address]  T4, free     Status: Abnormal   Collection Time: 09/01/22  1:31 AM  Result Value Ref Range   Free T4 1.20 (H) 0.61 - 1.12 ng/dL    Comment: (NOTE) Biotin ingestion may interfere with free T4 tests. If the results are inconsistent with the TSH level, previous test results, or the clinical presentation, then consider biotin interference. If needed, order repeat testing after stopping biotin. Performed at [facility], [address], [place], [place] [address]   MRSA Next Gen by PCR, Nasal     Status: None   Collection Time: 09/01/22  3:13 AM   Specimen: Nasal Mucosa; Nasal Swab  Result Value Ref Range   MRSA by PCR Next Gen NOT DETECTED NOT DETECTED    Comment: (NOTE) The GeneXpert MRSA Assay (FDA approved for NASAL specimens  only), is one  component of a comprehensive MRSA colonization surveillance program. It is not intended to diagnose MRSA infection nor to guide or monitor treatment for MRSA infections. Test performance is not FDA approved in patients less than 53 years old. Performed at [facility], [address] [place], [place] [address]   Lactic acid, plasma     Status: None   Collection Time: 09/01/22  3:44 AM  Result Value Ref Range   Lactic Acid, Venous 1.9 0.5 - 1.9 mmol/L    Comment: Performed at [facility], [address], [place], [place] [address]  Troponin I (High Sensitivity)     Status: Abnormal   Collection Time: 09/01/22  3:44 AM  Result Value Ref Range   Troponin I (High Sensitivity) 220 (HH) <18 ng/L    Comment: CRITICAL VALUE NOTED. VALUE IS CONSISTENT WITH PREVIOUSLY REPORTED/CALLED VALUE (NOTE) Elevated high sensitivity troponin I (hsTnI) values and significant  changes across serial measurements may suggest ACS but many other  chronic and acute conditions are known to elevate hsTnI results.  Refer to the "Links" section for chest pain algorithms and additional  guidance. Performed at [facility], [address], [place], [place] [address]     CT CHEST ABDOMEN PELVIS W CONTRAST  Result Date: 08/31/2022 CLINICAL DATA:  Sepsis EXAM: CT CHEST, ABDOMEN, AND PELVIS WITH CONTRAST TECHNIQUE: Multidetector CT imaging of the chest, abdomen and pelvis was performed following the standard protocol during bolus administration of intravenous contrast. RADIATION DOSE REDUCTION: This exam was performed according to the departmental dose-optimization program which includes automated exposure control, adjustment of the mA and/or kV according to patient size and/or use of iterative reconstruction technique. CONTRAST:  75mL OMNIPAQUE IOHEXOL 350 MG/ML SOLN COMPARISON:  None Available. FINDINGS: CHEST: Cardiovascular: No aortic injury. The thoracic aorta is normal in caliber.  The heart is normal in size. No significant pericardial effusion. Severe atherosclerotic plaque. The main pulmonary artery is normal in caliber. Four-vessel coronary calcifications status post coronary artery bypass graft. No central pulmonary embolus. Mediastinum/Nodes: No pneumomediastinum. No mediastinal hematoma. The esophagus is unremarkable. The thyroid is unremarkable. The central airways are patent. No mediastinal, hilar, or axillary lymphadenopathy. Lungs/Pleura: Emphysematous changes. No focal consolidation. No pulmonary nodule. No pulmonary mass. No pulmonary contusion or laceration. No pneumatocele formation. No pleural effusion. No pneumothorax. No hemothorax. Musculoskeletal/Chest wall: No chest wall mass.  Trace bilateral gynecomastia. No acute rib or sternal fracture. Chronic T7, T8, and T9 spinous process fractures. Associated chronic appearing burst fracture of the T9 vertebral body. No retropulsion into the central canal. Approximately at least 40% vertebral body height loss centrally. ABDOMEN / PELVIS: Hepatobiliary: Not enlarged. Vague slightly nodular hypodensity along the falciform ligament also noted on delayed imaging suggestive of focal fatty infiltration. No laceration or subcapsular hematoma. Cholelithiasis.  No biliary ductal dilatation. Pancreas: Normal pancreatic contour. No main pancreatic duct dilatation. Spleen: Not enlarged. No focal lesion. No laceration, subcapsular hematoma, or vascular injury. Adrenals/Urinary Tract: No nodularity bilaterally. Calcification associated with the kidneys likely vascular. No obstructive nephrolithiasis. Bilateral kidneys enhance symmetrically. No hydronephrosis. No contusion, laceration, or subcapsular hematoma. No injury to the vascular structures or collecting systems. No hydroureter. The urinary bladder is unremarkable. Stomach/Bowel: No small or large bowel wall thickening or dilatation. The appendix is not definitely identified with no  inflammatory changes in the right lower quadrant to suggest acute appendicitis. Vasculature/Lymphatics: Severe atherosclerotic plaque. No abdominal aorta or iliac aneurysm. No active contrast extravasation or pseudoaneurysm. No abdominal,  pelvic, inguinal lymphadenopathy. Reproductive: Normal. Other: No simple free fluid ascites. No pneumoperitoneum. No hemoperitoneum. No mesenteric hematoma identified. No organized fluid collection. Musculoskeletal: No significant soft tissue hematoma. No acute pelvic fracture.  No acute spinal fracture. Ports and Devices: None. IMPRESSION: 1. No acute intrathoracic, intra-abdominal, intrapelvic traumatic injury. 2. Chronic T9 burst fracture-correlate with point tenderness to palpation to evaluate for an acute component. Otherwise no definite acute fracture or traumatic malalignment of the thoracic or lumbar spine. 3. Other imaging findings of potential clinical significance: Cholelithiasis with no acute cholecystitis. Stool throughout the colon-correlate for constipation. Aortic Atherosclerosis (ICD10-I70.0) and Emphysema (ICD10-J43.9). Electronically Signed   By: Tish Frederickson M.D.   On: 08/31/2022 22:57   CT Head Wo Contrast  Result Date: 08/31/2022 CLINICAL DATA:  Neck trauma (Age >= 65y); Mental status change, unknown cause EXAM: CT HEAD WITHOUT CONTRAST CT CERVICAL SPINE WITHOUT CONTRAST TECHNIQUE: Multidetector CT imaging of the head and cervical spine was performed following the standard protocol without intravenous contrast. Multiplanar CT image reconstructions of the cervical spine were also generated. RADIATION DOSE REDUCTION: This exam was performed according to the departmental dose-optimization program which includes automated exposure control, adjustment of the mA and/or kV according to patient size and/or use of iterative reconstruction technique. COMPARISON:  CT head and C-spine 10/23/2021 FINDINGS: CT HEAD FINDINGS Brain: Cerebral ventricle sizes are  concordant with the degree of cerebral volume loss. Trace patchy and confluent areas of decreased attenuation are noted throughout the deep and periventricular white matter of the cerebral hemispheres bilaterally, compatible with chronic microvascular ischemic disease. No evidence of large-territorial acute infarction. No parenchymal hemorrhage. No mass lesion. No extra-axial collection. No mass effect or midline shift. No hydrocephalus. Basilar cisterns are patent. Vascular: No hyperdense vessel. Skull: No acute fracture or focal lesion. Sinuses/Orbits: Paranasal sinuses and mastoid air cells are clear. The orbits are unremarkable. Other: 6 mm left frontal scalp hematoma. 13 mm right parieto-occipital scalp hematoma formation. CT CERVICAL SPINE FINDINGS Alignment: Normal. Skull base and vertebrae: Chronic C2 dens fracture with increased separation of the fracture fragments now up to 3 mm (from less than 1 mm). No aggressive appearing focal osseous lesion or focal pathologic process. Soft tissues and spinal canal: No prevertebral fluid or swelling. No visible canal hematoma. Upper chest: Emphysematous changes. Other: Atherosclerotic plaque of the carotid arteries within the neck. IMPRESSION: 1.  No acute intracranial abnormality. 2. Chronic C2 dens fracture with increased separation of the fracture fragments up to 3 mm (from less than 1 mm). Findings may be acute versus chronic. If clinically indicated, consider MRI cervical spine for further evaluation. 3. Otherwise no acute displaced fracture or traumatic listhesis of the cervical spine. 4. A 6 mm left frontal scalp hematoma. 5. A 13 mm right parieto-occipital scalp hematoma formation. 6. Emphysema (ICD10-J43.9). Electronically Signed   By: Tish Frederickson M.D.   On: 08/31/2022 22:37   CT Cervical Spine Wo Contrast  Result Date: 08/31/2022 CLINICAL DATA:  Neck trauma (Age >= 65y); Mental status change, unknown cause EXAM: CT HEAD WITHOUT CONTRAST CT CERVICAL  SPINE WITHOUT CONTRAST TECHNIQUE: Multidetector CT imaging of the head and cervical spine was performed following the standard protocol without intravenous contrast. Multiplanar CT image reconstructions of the cervical spine were also generated. RADIATION DOSE REDUCTION: This exam was performed according to the departmental dose-optimization program which includes automated exposure control, adjustment of the mA and/or kV according to patient size and/or use of iterative reconstruction technique. COMPARISON:  CT head and C-spine  10/23/2021 FINDINGS: CT HEAD FINDINGS Brain: Cerebral ventricle sizes are concordant with the degree of cerebral volume loss. Trace patchy and confluent areas of decreased attenuation are noted throughout the deep and periventricular white matter of the cerebral hemispheres bilaterally, compatible with chronic microvascular ischemic disease. No evidence of large-territorial acute infarction. No parenchymal hemorrhage. No mass lesion. No extra-axial collection. No mass effect or midline shift. No hydrocephalus. Basilar cisterns are patent. Vascular: No hyperdense vessel. Skull: No acute fracture or focal lesion. Sinuses/Orbits: Paranasal sinuses and mastoid air cells are clear. The orbits are unremarkable. Other: 6 mm left frontal scalp hematoma. 13 mm right parieto-occipital scalp hematoma formation. CT CERVICAL SPINE FINDINGS Alignment: Normal. Skull base and vertebrae: Chronic C2 dens fracture with increased separation of the fracture fragments now up to 3 mm (from less than 1 mm). No aggressive appearing focal osseous lesion or focal pathologic process. Soft tissues and spinal canal: No prevertebral fluid or swelling. No visible canal hematoma. Upper chest: Emphysematous changes. Other: Atherosclerotic plaque of the carotid arteries within the neck. IMPRESSION: 1.  No acute intracranial abnormality. 2. Chronic C2 dens fracture with increased separation of the fracture fragments up to 3 mm  (from less than 1 mm). Findings may be acute versus chronic. If clinically indicated, consider MRI cervical spine for further evaluation. 3. Otherwise no acute displaced fracture or traumatic listhesis of the cervical spine. 4. A 6 mm left frontal scalp hematoma. 5. A 13 mm right parieto-occipital scalp hematoma formation. 6. Emphysema (ICD10-J43.9). Electronically Signed   By: Tish Frederickson M.D.   On: 08/31/2022 22:37   DG Pelvis Portable  Result Date: 08/31/2022 CLINICAL DATA:  Fall, weakness. EXAM: PORTABLE PELVIS 1-2 VIEWS COMPARISON:  None Available. FINDINGS: The cortical margins of the bony pelvis are intact. No fracture. Pubic symphysis and sacroiliac joints are congruent. Both femoral heads are well-seated in the respective acetabula. External artifact projects over the lateral left hip. Extensive arterial vascular calcifications. IMPRESSION: No pelvic fracture. Extensive arterial vascular calcifications. Electronically Signed   By: Narda Rutherford M.D.   On: 08/31/2022 20:38   DG Chest Port 1 View  Result Date: 08/31/2022 CLINICAL DATA:  Weakness nose, fall. EXAM: PORTABLE CHEST 1 VIEW COMPARISON:  Radiograph 06/08/2015 FINDINGS: Prior median sternotomy and CABG. Stable heart size allowing for lower lung volumes. Unchanged mediastinal contours. Aortic atherosclerosis. Patchy airspace opacity at the retrocardiac left lung base. No pneumothorax or large pleural effusion. On limited assessment, no acute osseous abnormalities are seen. IMPRESSION: 1. Patchy airspace opacity at the retrocardiac left lung base. This may represent pneumonia, although possibility of pulmonary contusion is considered in the setting of fall. 2. Prior CABG. Electronically Signed   By: Narda Rutherford M.D.   On: 08/31/2022 20:37    ROS: As above Blood pressure 132/63, pulse 99, temperature 98.4 F (36.9 C), temperature source Axillary, resp. rate 20, height 6' (1.829 m), weight 73.7 kg, SpO2 92 %. Estimated body mass  index is 22.04 kg/m as calculated from the following:   Height as of this encounter: 6' (1.829 m).   Weight as of this encounter: 73.7 kg.  Physical Exam  General: An alert pleasant, and mildly confused frail-appearing 82 year old white male in no apparent distress  HEENT: Normocephalic.  Extraocular muscles are intact.  Neck: The patient is in a cervical collar.  No obvious deformity.  Thorax: Symmetric  Abdomen: Soft  Extremities: He is wearing bilateral mittens, diffuse ecchymosis  Neurologic exam: The patient is alert and oriented to person  and a hospital.  He does not know which hospital or the month.  Cranial nerves II through XII were examined bilaterally and grossly normal.  The patient's motor strength is grossly normal as about a bicep, tricep, gastrocnemius and dorsiflexors.  Sensory function is intact to light touch sensation in all tested dermatomes bilaterally.  Imaging studies: I reviewed the patient's head CT performed at [facility] yesterday.  He has diffuse atrophy.  5 also reviewed the patient's cervical CT performed at [facility] yesterday.  It demonstrates a nondisplaced type II odontoid fracture and diffuse ankylosis.    Assessment/Plan: Type II odontoid fracture: It is unlikely that this fracture will heal in a collar, however the patient does not appear to be an appropriate surgical candidate.  I recommend we continue the collar for now.  He can follow-up with me in the office after discharge.  Please call if I can be of further assistance.  [name] 09/01/2022, 10:08 AM

## 2022-09-01 NOTE — Assessment & Plan Note (Addendum)
Cpk of [id number]. 2/2 fall and immobility. MIVF. LR bolus.

## 2022-09-01 NOTE — Progress Notes (Signed)
PROGRESS NOTE   [name]  ZOX:[id number] DOB: 07-07-1940 DOA: 08/31/2022 PCP: Tresa Garter, MD  Brief Narrative:  82 year old home dwelling male ICM EF 45-50% 2016+ CABG 1993, redo 2016 CABG X4 A flutter chronic anticoagulation Eliquis CHADVASC >3-previously on amiodarone PVD + stent L SFA/right SFA 2011 Prior heavy smoker Macular degeneration Chronic lumbago Patient allegedly had a fall last year at his home falling up the stairs hit his neck and had a 1 mm dens fracture that was nonoperable at the time  Brought to emergency room with complaint of fall found down on the kitchen floor lethargic confused covered in feces foul-smelling urine c-collar in place heart rate 120 and A-fib given 500 cc bolus Workup = BUNs/creatinine 35/1.3 (baseline 1.0), AST/ALT 104/51 baseline normal bilirubin 1.5 Troponin trend 208-220, lactic acid 2.3-3.6 Potassium 3.3 W BC 9.2, hemoglobin 11.8, platelet 246 CXR patchy airspace opacity retrocardiac LL base?  Pulmonary contusion?  Pneumonia--pelvic x-rays negative for fracture CT neck = chronic C2 dens fracture increase separation of fracture segments >3 mm, chronic T9 burst fracture CT head 6 mm left frontal scalp hematoma, 13 mm right parietal occipital scalp hematoma  Neurosurgery consulted by EDP Cardiology consulted by admitting physician regarding elevated troponin   Hospital-Problem based course  Severe sepsis on admission DDx urinary infection?  Aspiration-lactic acidosis 3.6 peak on admission  Continue vancomycin/cefepime IV, saline @ 100cc/h Follow blood culture,Urine culture pending SLP to evaluate for consistencies not emergently, from my perspective can have a dysphagia 3 diet until  Elevated troponin demand ischemia/ICM EF 45-50% with redo CABG 2016 x 4 A-fib RVR Await echo, IV heparin GTT given troponin 200 range-not a cath candidate? Metoprolol XL 50 home dose resumed--- Eliquis held in favor of heparin as unclear  if will need surgery Holding at this time aspirin Plavix for now per cardiology  Hypokalemia Magnesium 1.9 so we will give oral replacement 400 twice daily mild Mag-Ox Repeat labs in the morning  Fall, rhabdomyolysis Check CK in morning Keep Foley which is placed in ED for intensive monitoring and likely can withdraw in several days  C2 dens fracture with increasing separation >3 mm Chronic T9 burst fracture D/w Dr. Cameron Ali do pt/ot as per Dr. Danie Chandler restrictions and wear collar and keep neck neutral Patient is a poor operative candidate for C2 dens fracture but can undergo therapy with collar in place  Left hip pain with external rotation at hip Plain pelvic x-rays were negative-obtain CT hip given pain with external rotation  PVD Chronic smoker Eventually resume asa/plavix?  Social Patient takes care of his similarly aged wife who has severe dementia and his home is in a state of disrepair and apparently squalor according to the son who lives in [place] Therapy to evaluate and I will discuss implications with the patient himself once he stabilizes little more  DVT prophylaxis: IV heparin Code Status: Presumed full Family Communication: Long discussion at the bedside with [name] the patient's son who lives in [place] Disposition:  Status is: Inpatient Remains inpatient appropriate because:   Requires further workup rate control etc. Requires CT hip to rule out occult fracture     Subjective: Sleepy but arouses somewhat readily-mild neck pain Seems somewhat coherent No chest pain, fever, chills, nausea-no spontaneous cough No lower extremity edema    Objective: Vitals:   09/01/22 0235 09/01/22 0300 09/01/22 0400 09/01/22 0500  BP: (!) 152/82 133/67 132/74 (!) 145/82  Pulse: (!) 106 (!) 102 (!) 106 98  Resp: 20  15 14 (!) 21  Temp:      TempSrc:      SpO2: 95% 92% 94% 95%  Weight:      Height:        Intake/Output Summary (Last 24 hours) at  09/01/2022 0639 Last data filed at 09/01/2022 0559 Gross per 24 hour  Intake 1550.75 ml  Output 625 ml  Net 925.75 ml   Filed Weights   08/31/22 2008 09/01/22 0230  Weight: 77.1 kg 73.7 kg    Examination:  EOMI NCAT moving upper extremities without deficit did not test finger-nose-finger no icterus no pallor--small hematomas over scalp S1-S2 A-fib on monitors not controlled Abdomen is soft no rebound no guarding Painful ROM with external rotation at the left hip can straight leg raise on the right Reflexes are brisk in the knees Rest of neuroexam deferred    Data Reviewed: personally reviewed   CBC    Component Value Date/Time   WBC 10.1 09/01/2022 0131   RBC 3.58 (L) 09/01/2022 0131   HGB 11.8 (L) 09/01/2022 0131   HGB 13.7 10/20/2019 1331   HGB 13.2 12/01/2008 1247   HCT 35.0 (L) 09/01/2022 0131   HCT 38.9 10/20/2019 1331   HCT 38.3 (L) 12/01/2008 1247   PLT 246 09/01/2022 0131   PLT 231 10/20/2019 1331   MCV 97.8 09/01/2022 0131   MCV 97 10/20/2019 1331   MCV 99.9 (H) 12/01/2008 1247   MCH 33.0 09/01/2022 0131   MCHC 33.7 09/01/2022 0131   RDW 12.6 09/01/2022 0131   RDW 11.9 10/20/2019 1331   RDW 12.7 12/01/2008 1247   LYMPHSABS 0.3 (L) 08/31/2022 2020   LYMPHSABS 0.7 (L) 12/01/2008 1247   MONOABS 0.9 08/31/2022 2020   MONOABS 0.8 12/01/2008 1247   EOSABS 0.0 08/31/2022 2020   EOSABS 0.5 12/01/2008 1247   BASOSABS 0.0 08/31/2022 2020   BASOSABS 0.0 12/01/2008 1247      Latest Ref Rng & Units 09/01/2022    1:31 AM 08/31/2022    9:22 PM 08/31/2022    8:20 PM  CMP  Glucose 70 - 99 mg/dL 161  096  045   BUN 8 - 23 mg/dL 31  33  35   Creatinine 0.61 - 1.24 mg/dL 4.09  8.11  9.14   Sodium 135 - 145 mmol/L 141  144  141   Potassium 3.5 - 5.1 mmol/L 3.2  3.3  3.3   Chloride 98 - 111 mmol/L 107  106  104   CO2 22 - 32 mmol/L 22   22   Calcium 8.9 - 10.3 mg/dL 8.8   9.6   Total Protein 6.5 - 8.1 g/dL 4.5   5.6   Total Bilirubin 0.3 - 1.2 mg/dL 1.3   1.5    Alkaline Phos 38 - 126 U/L 43   55   AST 15 - 41 U/L 93   104   ALT 0 - 44 U/L 43   51      Radiology Studies: CT CHEST ABDOMEN PELVIS W CONTRAST  Result Date: 08/31/2022 CLINICAL DATA:  Sepsis EXAM: CT CHEST, ABDOMEN, AND PELVIS WITH CONTRAST TECHNIQUE: Multidetector CT imaging of the chest, abdomen and pelvis was performed following the standard protocol during bolus administration of intravenous contrast. RADIATION DOSE REDUCTION: This exam was performed according to the departmental dose-optimization program which includes automated exposure control, adjustment of the mA and/or kV according to patient size and/or use of iterative reconstruction technique. CONTRAST:  75mL OMNIPAQUE IOHEXOL 350 MG/ML SOLN  COMPARISON:  None Available. FINDINGS: CHEST: Cardiovascular: No aortic injury. The thoracic aorta is normal in caliber. The heart is normal in size. No significant pericardial effusion. Severe atherosclerotic plaque. The main pulmonary artery is normal in caliber. Four-vessel coronary calcifications status post coronary artery bypass graft. No central pulmonary embolus. Mediastinum/Nodes: No pneumomediastinum. No mediastinal hematoma. The esophagus is unremarkable. The thyroid is unremarkable. The central airways are patent. No mediastinal, hilar, or axillary lymphadenopathy. Lungs/Pleura: Emphysematous changes. No focal consolidation. No pulmonary nodule. No pulmonary mass. No pulmonary contusion or laceration. No pneumatocele formation. No pleural effusion. No pneumothorax. No hemothorax. Musculoskeletal/Chest wall: No chest wall mass.  Trace bilateral gynecomastia. No acute rib or sternal fracture. Chronic T7, T8, and T9 spinous process fractures. Associated chronic appearing burst fracture of the T9 vertebral body. No retropulsion into the central canal. Approximately at least 40% vertebral body height loss centrally. ABDOMEN / PELVIS: Hepatobiliary: Not enlarged. Vague slightly nodular  hypodensity along the falciform ligament also noted on delayed imaging suggestive of focal fatty infiltration. No laceration or subcapsular hematoma. Cholelithiasis.  No biliary ductal dilatation. Pancreas: Normal pancreatic contour. No main pancreatic duct dilatation. Spleen: Not enlarged. No focal lesion. No laceration, subcapsular hematoma, or vascular injury. Adrenals/Urinary Tract: No nodularity bilaterally. Calcification associated with the kidneys likely vascular. No obstructive nephrolithiasis. Bilateral kidneys enhance symmetrically. No hydronephrosis. No contusion, laceration, or subcapsular hematoma. No injury to the vascular structures or collecting systems. No hydroureter. The urinary bladder is unremarkable. Stomach/Bowel: No small or large bowel wall thickening or dilatation. The appendix is not definitely identified with no inflammatory changes in the right lower quadrant to suggest acute appendicitis. Vasculature/Lymphatics: Severe atherosclerotic plaque. No abdominal aorta or iliac aneurysm. No active contrast extravasation or pseudoaneurysm. No abdominal, pelvic, inguinal lymphadenopathy. Reproductive: Normal. Other: No simple free fluid ascites. No pneumoperitoneum. No hemoperitoneum. No mesenteric hematoma identified. No organized fluid collection. Musculoskeletal: No significant soft tissue hematoma. No acute pelvic fracture.  No acute spinal fracture. Ports and Devices: None. IMPRESSION: 1. No acute intrathoracic, intra-abdominal, intrapelvic traumatic injury. 2. Chronic T9 burst fracture-correlate with point tenderness to palpation to evaluate for an acute component. Otherwise no definite acute fracture or traumatic malalignment of the thoracic or lumbar spine. 3. Other imaging findings of potential clinical significance: Cholelithiasis with no acute cholecystitis. Stool throughout the colon-correlate for constipation. Aortic Atherosclerosis (ICD10-I70.0) and Emphysema (ICD10-J43.9).  Electronically Signed   By: Tish Frederickson M.D.   On: 08/31/2022 22:57   CT Head Wo Contrast  Result Date: 08/31/2022 CLINICAL DATA:  Neck trauma (Age >= 65y); Mental status change, unknown cause EXAM: CT HEAD WITHOUT CONTRAST CT CERVICAL SPINE WITHOUT CONTRAST TECHNIQUE: Multidetector CT imaging of the head and cervical spine was performed following the standard protocol without intravenous contrast. Multiplanar CT image reconstructions of the cervical spine were also generated. RADIATION DOSE REDUCTION: This exam was performed according to the departmental dose-optimization program which includes automated exposure control, adjustment of the mA and/or kV according to patient size and/or use of iterative reconstruction technique. COMPARISON:  CT head and C-spine 10/23/2021 FINDINGS: CT HEAD FINDINGS Brain: Cerebral ventricle sizes are concordant with the degree of cerebral volume loss. Trace patchy and confluent areas of decreased attenuation are noted throughout the deep and periventricular white matter of the cerebral hemispheres bilaterally, compatible with chronic microvascular ischemic disease. No evidence of large-territorial acute infarction. No parenchymal hemorrhage. No mass lesion. No extra-axial collection. No mass effect or midline shift. No hydrocephalus. Basilar cisterns are patent. Vascular: No hyperdense  vessel. Skull: No acute fracture or focal lesion. Sinuses/Orbits: Paranasal sinuses and mastoid air cells are clear. The orbits are unremarkable. Other: 6 mm left frontal scalp hematoma. 13 mm right parieto-occipital scalp hematoma formation. CT CERVICAL SPINE FINDINGS Alignment: Normal. Skull base and vertebrae: Chronic C2 dens fracture with increased separation of the fracture fragments now up to 3 mm (from less than 1 mm). No aggressive appearing focal osseous lesion or focal pathologic process. Soft tissues and spinal canal: No prevertebral fluid or swelling. No visible canal hematoma.  Upper chest: Emphysematous changes. Other: Atherosclerotic plaque of the carotid arteries within the neck. IMPRESSION: 1.  No acute intracranial abnormality. 2. Chronic C2 dens fracture with increased separation of the fracture fragments up to 3 mm (from less than 1 mm). Findings may be acute versus chronic. If clinically indicated, consider MRI cervical spine for further evaluation. 3. Otherwise no acute displaced fracture or traumatic listhesis of the cervical spine. 4. A 6 mm left frontal scalp hematoma. 5. A 13 mm right parieto-occipital scalp hematoma formation. 6. Emphysema (ICD10-J43.9). Electronically Signed   By: Tish Frederickson M.D.   On: 08/31/2022 22:37   CT Cervical Spine Wo Contrast  Result Date: 08/31/2022 CLINICAL DATA:  Neck trauma (Age >= 65y); Mental status change, unknown cause EXAM: CT HEAD WITHOUT CONTRAST CT CERVICAL SPINE WITHOUT CONTRAST TECHNIQUE: Multidetector CT imaging of the head and cervical spine was performed following the standard protocol without intravenous contrast. Multiplanar CT image reconstructions of the cervical spine were also generated. RADIATION DOSE REDUCTION: This exam was performed according to the departmental dose-optimization program which includes automated exposure control, adjustment of the mA and/or kV according to patient size and/or use of iterative reconstruction technique. COMPARISON:  CT head and C-spine 10/23/2021 FINDINGS: CT HEAD FINDINGS Brain: Cerebral ventricle sizes are concordant with the degree of cerebral volume loss. Trace patchy and confluent areas of decreased attenuation are noted throughout the deep and periventricular white matter of the cerebral hemispheres bilaterally, compatible with chronic microvascular ischemic disease. No evidence of large-territorial acute infarction. No parenchymal hemorrhage. No mass lesion. No extra-axial collection. No mass effect or midline shift. No hydrocephalus. Basilar cisterns are patent. Vascular: No  hyperdense vessel. Skull: No acute fracture or focal lesion. Sinuses/Orbits: Paranasal sinuses and mastoid air cells are clear. The orbits are unremarkable. Other: 6 mm left frontal scalp hematoma. 13 mm right parieto-occipital scalp hematoma formation. CT CERVICAL SPINE FINDINGS Alignment: Normal. Skull base and vertebrae: Chronic C2 dens fracture with increased separation of the fracture fragments now up to 3 mm (from less than 1 mm). No aggressive appearing focal osseous lesion or focal pathologic process. Soft tissues and spinal canal: No prevertebral fluid or swelling. No visible canal hematoma. Upper chest: Emphysematous changes. Other: Atherosclerotic plaque of the carotid arteries within the neck. IMPRESSION: 1.  No acute intracranial abnormality. 2. Chronic C2 dens fracture with increased separation of the fracture fragments up to 3 mm (from less than 1 mm). Findings may be acute versus chronic. If clinically indicated, consider MRI cervical spine for further evaluation. 3. Otherwise no acute displaced fracture or traumatic listhesis of the cervical spine. 4. A 6 mm left frontal scalp hematoma. 5. A 13 mm right parieto-occipital scalp hematoma formation. 6. Emphysema (ICD10-J43.9). Electronically Signed   By: Tish Frederickson M.D.   On: 08/31/2022 22:37   DG Pelvis Portable  Result Date: 08/31/2022 CLINICAL DATA:  Fall, weakness. EXAM: PORTABLE PELVIS 1-2 VIEWS COMPARISON:  None Available. FINDINGS: The cortical margins of  the bony pelvis are intact. No fracture. Pubic symphysis and sacroiliac joints are congruent. Both femoral heads are well-seated in the respective acetabula. External artifact projects over the lateral left hip. Extensive arterial vascular calcifications. IMPRESSION: No pelvic fracture. Extensive arterial vascular calcifications. Electronically Signed   By: Narda Rutherford M.D.   On: 08/31/2022 20:38   DG Chest Port 1 View  Result Date: 08/31/2022 CLINICAL DATA:  Weakness nose,  fall. EXAM: PORTABLE CHEST 1 VIEW COMPARISON:  Radiograph 06/08/2015 FINDINGS: Prior median sternotomy and CABG. Stable heart size allowing for lower lung volumes. Unchanged mediastinal contours. Aortic atherosclerosis. Patchy airspace opacity at the retrocardiac left lung base. No pneumothorax or large pleural effusion. On limited assessment, no acute osseous abnormalities are seen. IMPRESSION: 1. Patchy airspace opacity at the retrocardiac left lung base. This may represent pneumonia, although possibility of pulmonary contusion is considered in the setting of fall. 2. Prior CABG. Electronically Signed   By: Narda Rutherford M.D.   On: 08/31/2022 20:37     Scheduled Meds:  Chlorhexidine Gluconate Cloth  6 each Topical Daily   metoprolol tartrate  2.5 mg Intravenous Q4H   nicotine  14 mg Transdermal Daily   sodium chloride flush  3 mL Intravenous Q12H   Continuous Infusions:  ceFEPime (MAXIPIME) IV     lactated ringers 100 mL/hr at 09/01/22 0559   vancomycin       LOS: 0 days   Time spent: 70 including care coordination time with multiple consultants  Rhetta Mura, MD [facility] To contact the attending provider between 7A-7P or the covering provider during after hours 7P-7A, please log into the web site [email or web address] and access using universal [place] password for that web site. If you do not have the password, please call the hospital operator.  09/01/2022, 6:39 AM

## 2022-09-01 NOTE — Progress Notes (Signed)
OT Cancellation Note  Patient Details Name: MANVIK KOZLOFF MRN: [id number] DOB: 07/27/1940   Cancelled Treatment:    Reason Eval/Treat Not Completed: Medical issues which prohibited therapy Patient currently in Afib in 120s at rest. OT will follow back when more medically stable to complete OT evaluation.   Pollyann Glen E. Harlee Eckroth, OTR/L Acute Rehabilitation Services [phone]   Cherlyn Cushing 09/01/2022, 2:10 PM

## 2022-09-01 NOTE — Assessment & Plan Note (Signed)
Lab Results  Component Value Date   CREATININE 1.00 08/31/2022   CREATININE 1.31 (H) 08/31/2022   CREATININE 1.08 04/10/2022  Monitor and avoid contrast unless absolutely needed .

## 2022-09-01 NOTE — Hospital Course (Addendum)
Per ED: Altered and on ground for few days. HPI limited due to confusion.  Wife also was on floor and is confused Febrile./ uti/ aki/ rhabdo/ sepsis C spine -3 mm displacement. NSx see in am.  A.fib rvr. Cabg/ echo 2016.  z

## 2022-09-01 NOTE — Plan of Care (Signed)
  Problem: Clinical Measurements: Goal: Ability to maintain clinical measurements within normal limits will improve Outcome: Progressing Goal: Respiratory complications will improve Outcome: Progressing Goal: Cardiovascular complication will be avoided Outcome: Progressing   

## 2022-09-01 NOTE — Progress Notes (Signed)
Received pt from ED to 2C03 via stretcher.  Pt oriented to self only, responds to voice and will follow some commands.  [place] collar in place.  No family present at this time.  Patient transferred to hospital bed.  Tolerated well.  Patient skin visibly dirty with areas of built up dirt.  Patient placed on monitor and bathed, chg bath given.  Pressure injuries noted to sacrum, left hip, and left great toe area. Foam applied.  Will consult wound rn.  Also noted multiple bumps on patients head within hair.  Patient checked for impaction, patient noted to have several small soft balls of stool.  Stool removed.  Patient tolerated well.  Fall precautions initiated and floor matts placed in room.

## 2022-09-01 NOTE — ED Notes (Signed)
ED TO INPATIENT HANDOFF REPORT  ED Nurse Name and Phone #:  Johnna Acosta [phone]  S Name/Age/Gender [name] 82 y.o. male Room/Bed: 009C/009C  Code Status   Code Status: Full Code  Home/SNF/Other Home Patient oriented to: self Is this baseline? No   Triage Complete: Triage complete  Chief Complaint AMS (altered mental status) [R41.82]  Triage Note Pt to ED via [facility] from home c/o fall.  Per EMS family unable to get in touch with patient or wife and had neighbors check on them still with no answer, had wellness check on house with no answer and forced entry made and pt and wife found on kitchen floor.  Per neighbor wife was wearing same clothes last seen in 1 week ago.  Pt presents lethargic, confused, covered in feces and foul smelling urine, C-collar in place, c/o neck and back pain.  ED at bedside on arrival.  EMS vitals 134/60 manual BP, 120 HR a.fib, 96% RA and placed on 4L [place] up to 98%, 168 CBG, given 500cc LR.   Allergies Allergies  Allergen Reactions   Adhesive [Tape] Other (See Comments)    Takes skin off    Codeine Sulfate Itching and Other (See Comments)    headaches   Hydrocodone    Hydrocodone-Acetaminophen Other (See Comments)   Other Other (See Comments)    Other reaction(s): NAUSEA,VOMITING, Headache   Wound Dressing Adhesive     Other reaction(s): Skin irritation    Level of Care/Admitting Diagnosis ED Disposition     ED Disposition  Admit   Condition  --   Comment  Hospital Area: [facility] [[id number]]  Level of Care: Progressive [102]  Admit to Progressive based on following criteria: NEUROLOGICAL AND NEUROSURGICAL complex patients with significant risk of instability, who do not meet ICU criteria, yet require close observation or frequent assessment (< / = every 2 - 4 hours) with medical / nursing intervention.  Admit to Progressive based on following criteria: CARDIOVASCULAR & THORACIC of moderate stability with acute  coronary syndrome symptoms/low risk myocardial infarction/hypertensive urgency/arrhythmias/heart failure potentially compromising stability and stable post cardiovascular intervention patients.  Admit to Progressive based on following criteria: MULTISYSTEM THREATS such as stable sepsis, metabolic/electrolyte imbalance with or without encephalopathy that is responding to early treatment.  May admit patient to [facility] or [facility] if equivalent level of care is available:: Yes  Covid Evaluation: Asymptomatic - no recent exposure (last 10 days) testing not required  Diagnosis: AMS (altered mental status) [[id number]]  Admitting Physician: Darrold Junker  Attending Physician: Darrold Junker  Certification:: I certify this patient will need inpatient services for at least 2 midnights  Estimated Length of Stay: 5          B Medical/Surgery History Past Medical History:  Diagnosis Date   ALLERGIC RHINITIS    Atrial flutter (HCC)    CAD (coronary artery disease)    CAD s/p CABG 1993 and redo 2016,   Carotid artery disease (HCC)    bilateral   Chronic combined systolic and diastolic CHF (congestive heart failure) (HCC)    CKD (chronic kidney disease), stage II    COPD (chronic obstructive pulmonary disease) (HCC)    Diverticulosis    Hyperlipidemia    Osteoarthritis    PAF (paroxysmal atrial fibrillation) (HCC)    Peripheral neuropathy    Personal history of prostate cancer    PVD (peripheral vascular disease) with claudication (HCC)    Radiation proctitis  STEMI (ST elevation myocardial infarction) (HCC) 04/29/2014   PTCA [place] and CFX, in setting of VF/VT arrest and CGS   Tobacco use disorder, continuous    Ventral hernia    Past Surgical History:  Procedure Laterality Date   APPENDECTOMY     CARDIOVERSION N/A 06/24/2014   Procedure: CARDIOVERSION;  Surgeon: Chrystie Nose, MD;  Location: [facility] ENDOSCOPY;  Service: Cardiovascular;  Laterality: N/A;   CORONARY  ARTERY BYPASS GRAFT  1992   CORONARY ARTERY BYPASS GRAFT N/A 05/28/2014   Procedure: REDO CORONARY ARTERY BYPASS GRAFTING (CABG);  Surgeon: Delight Ovens, MD;  Location[facility] OR;  Service: Open Heart Surgery;  Laterality: N/A;  Times 4 using right internal mammary artery to Intermediate artery and endoscopically harvested left saphenous vein to LAD, OM1, and PD coronary arteries.   LEFT HEART CATHETERIZATION WITH CORONARY ANGIOGRAM N/A 04/30/2014   Procedure: LEFT HEART CATHETERIZATION WITH CORONARY ANGIOGRAM;  Surgeon: Peter M Swaziland, MD; [place] 90% (s/p PTCA), LAD 80%, CFX 100% (s/p PTCA), RCA OK, LIMA-LAD atretic, SVG-OM 100% (chronic), EF 45%, pt req defib x 13 for VF/VT arrest, CHB w/ tem pacer, IABP and intubation   LUMBAR LAMINECTOMY  [id number]    X2   PROSTATECTOMY  05/2008   Dr. Laverle Patter and XRT   TEE WITHOUT CARDIOVERSION N/A 05/28/2014   Procedure: TRANSESOPHAGEAL ECHOCARDIOGRAM (TEE);  Surgeon: Delight Ovens, MD;  Location: [facility] OR;  Service: Open Heart Surgery;  Laterality: N/A;   TEE WITHOUT CARDIOVERSION N/A 06/24/2014   Procedure: TRANSESOPHAGEAL ECHOCARDIOGRAM (TEE);  Surgeon: Chrystie Nose, MD;  Location[facility] ENDOSCOPY;  Service: Cardiovascular;  Laterality: N/A;   TONSILLECTOMY       A IV Location/Drains/Wounds Patient Lines/Drains/Airways Status     Active Line/Drains/Airways     Name Placement date Placement time Site Days   Peripheral IV 08/31/22 20 G Right Antecubital 08/31/22  2004  Antecubital  1   Peripheral IV 08/31/22 18 G Anterior;Left;Proximal Forearm 08/31/22  2053  Forearm  1            Intake/Output Last 24 hours  Intake/Output Summary (Last 24 hours) at 09/01/2022 0051 Last data filed at 09/01/2022 0012 Gross per 24 hour  Intake 509.02 ml  Output --  Net 509.02 ml    Labs/Imaging Results for orders placed or performed during the hospital encounter of 08/31/22 (from the past 48 hour(s))  Lactic acid, plasma     Status: Abnormal   Collection  Time: 08/31/22  8:20 PM  Result Value Ref Range   Lactic Acid, Venous 2.3 (HH) 0.5 - 1.9 mmol/L    Comment: CRITICAL RESULT CALLED TO, READ BACK BY AND VERIFIED WITH W. HICKS RN 08/31/22 @2140  BY J. WHITE Performed at [facility], [address] [place], [place] [address]   Comprehensive metabolic panel     Status: Abnormal   Collection Time: 08/31/22  8:20 PM  Result Value Ref Range   Sodium 141 135 - 145 mmol/L   Potassium 3.3 (L) 3.5 - 5.1 mmol/L   Chloride 104 98 - 111 mmol/L   CO2 22 22 - 32 mmol/L   Glucose, Bld 161 (H) 70 - 99 mg/dL    Comment: Glucose reference range applies only to samples taken after fasting for at least 8 hours.   BUN 35 (H) 8 - 23 mg/dL   Creatinine, Ser 1.19 (H) 0.61 - 1.24 mg/dL   Calcium 9.6 8.9 - 14.7 mg/dL   Total Protein 5.6 (L) 6.5 -  8.1 g/dL   Albumin 3.0 (L) 3.5 - 5.0 g/dL   AST 161 (H) 15 - 41 U/L   ALT 51 (H) 0 - 44 U/L   Alkaline Phosphatase 55 38 - 126 U/L   Total Bilirubin 1.5 (H) 0.3 - 1.2 mg/dL   GFR, Estimated 54 (L) >60 mL/min    Comment: (NOTE) Calculated using the CKD-EPI Creatinine Equation (2021)    Anion gap 15 5 - 15    Comment: Performed at [facility], [address], [place], [place] [address]  CBC with Differential     Status: Abnormal   Collection Time: 08/31/22  8:20 PM  Result Value Ref Range   WBC 9.2 4.0 - 10.5 K/uL   RBC 4.13 (L) 4.22 - 5.81 MIL/uL   Hemoglobin 14.0 13.0 - 17.0 g/dL   HCT 54.0 98.1 - 19.1 %   MCV 96.9 80.0 - 100.0 fL   MCH 33.9 26.0 - 34.0 pg   MCHC 35.0 30.0 - 36.0 g/dL   RDW 47.8 29.5 - 62.1 %   Platelets 321 150 - 400 K/uL   nRBC 0.0 0.0 - 0.2 %   Neutrophils Relative % 86 %   Neutro Abs 8.0 (H) 1.7 - 7.7 K/uL   Lymphocytes Relative 3 %   Lymphs Abs 0.3 (L) 0.7 - 4.0 K/uL   Monocytes Relative 10 %   Monocytes Absolute 0.9 0.1 - 1.0 K/uL   Eosinophils Relative 0 %   Eosinophils Absolute 0.0 0.0 - 0.5 K/uL   Basophils Relative 0 %   Basophils Absolute 0.0 0.0 - 0.1 K/uL    Immature Granulocytes 1 %   Abs Immature Granulocytes 0.05 0.00 - 0.07 K/uL    Comment: Performed at [facility], [address] [place], [place] [address]  Protime-INR     Status: Abnormal   Collection Time: 08/31/22  8:20 PM  Result Value Ref Range   Prothrombin Time 16.5 (H) 11.4 - 15.2 seconds   INR 1.3 (H) 0.8 - 1.2    Comment: (NOTE) INR goal varies based on device and disease states. Performed at [facility], [address] [place], [place] [address]   APTT     Status: None   Collection Time: 08/31/22  8:20 PM  Result Value Ref Range   aPTT 30 24 - 36 seconds    Comment: Performed at [facility], [address], [place], [place] [address]  .Cooxemetry Panel (carboxy, met, total hgb, O2 sat)(NOT AT [facility] or [facility])     Status: Abnormal   Collection Time: 08/31/22  8:20 PM  Result Value Ref Range   Total hemoglobin 14.4 12.0 - 16.0 g/dL   O2 Saturation 84.1 %   Carboxyhemoglobin 2.0 (H) 0.5 - 1.5 %   Methemoglobin 0.9 0.0 - 1.5 %    Comment: Performed at [facility], [address], [place], [place] [address]  Troponin I (High Sensitivity)     Status: Abnormal   Collection Time: 08/31/22  8:20 PM  Result Value Ref Range   Troponin I (High Sensitivity) 208 (HH) <18 ng/L    Comment: CRITICAL RESULT CALLED TO, READ BACK BY AND VERIFIED WITH F. JONES RN 08/31/22 @2151  BY J. WHITE (NOTE) Elevated high sensitivity troponin I (hsTnI) values and significant  changes across serial measurements may suggest ACS but many other  chronic and acute conditions are known to elevate hsTnI results.  Refer to the "Links" section for chest  pain algorithms and additional  guidance. Performed at [facility], [address] [place], [place] [address]   CK     Status: Abnormal   Collection Time: 08/31/22  8:20 PM  Result Value Ref Range   Total CK 4,821 (H) 49 - 397 U/L    Comment: RESULT CONFIRMED BY MANUAL DILUTION Performed at [facility],  [address] [place], [place] [address]   Urinalysis, w/ Reflex to Culture (Infection Suspected) -Urine, Clean Catch     Status: Abnormal   Collection Time: 08/31/22  8:50 PM  Result Value Ref Range   Specimen Source URINE, CLEAN CATCH    Color, Urine YELLOW YELLOW   APPearance HAZY (A) CLEAR   Specific Gravity, Urine 1.016 1.005 - 1.030   pH 7.0 5.0 - 8.0   Glucose, UA NEGATIVE NEGATIVE mg/dL   Hgb urine dipstick MODERATE (A) NEGATIVE   Bilirubin Urine NEGATIVE NEGATIVE   Ketones, ur 20 (A) NEGATIVE mg/dL   Protein, ur 098 (A) NEGATIVE mg/dL   Nitrite POSITIVE (A) NEGATIVE   Leukocytes,Ua NEGATIVE NEGATIVE   RBC / HPF 0-5 0 - 5 RBC/hpf   WBC, UA 0-5 0 - 5 WBC/hpf    Comment:        Reflex urine culture not performed if WBC <=10, OR if Squamous epithelial cells >5. If Squamous epithelial cells >5 suggest recollection.    Bacteria, UA MANY (A) NONE SEEN   Squamous Epithelial / HPF 0-5 0 - 5 /HPF   Mucus PRESENT    Hyaline Casts, UA PRESENT     Comment: Performed at [facility], [address], [place], [place] [address]  I-Stat Chem 8, ED     Status: Abnormal   Collection Time: 08/31/22  9:22 PM  Result Value Ref Range   Sodium 144 135 - 145 mmol/L   Potassium 3.3 (L) 3.5 - 5.1 mmol/L   Chloride 106 98 - 111 mmol/L   BUN 33 (H) 8 - 23 mg/dL   Creatinine, Ser 7.82 0.61 - 1.24 mg/dL   Glucose, Bld 956 (H) 70 - 99 mg/dL    Comment: Glucose reference range applies only to samples taken after fasting for at least 8 hours.   Calcium, Ion 1.29 1.15 - 1.40 mmol/L   TCO2 23 22 - 32 mmol/L   Hemoglobin 13.3 13.0 - 17.0 g/dL   HCT 21.3 08.6 - 57.8 %  Troponin I (High Sensitivity)     Status: Abnormal   Collection Time: 08/31/22 10:15 PM  Result Value Ref Range   Troponin I (High Sensitivity) 210 (HH) <18 ng/L    Comment: CRITICAL VALUE NOTED. VALUE IS CONSISTENT WITH PREVIOUSLY REPORTED/CALLED VALUE (NOTE) Elevated high sensitivity troponin I (hsTnI) values and significant   changes across serial measurements may suggest ACS but many other  chronic and acute conditions are known to elevate hsTnI results.  Refer to the "Links" section for chest pain algorithms and additional  guidance. Performed at [facility], [address], [place], [place] [address]   Lactic acid, plasma     Status: Abnormal   Collection Time: 08/31/22 10:45 PM  Result Value Ref Range   Lactic Acid, Venous 2.6 (HH) 0.5 - 1.9 mmol/L    Comment: CRITICAL VALUE NOTED. VALUE IS CONSISTENT WITH PREVIOUSLY REPORTED/CALLED VALUE Performed at [facility], [address] [place], [place] [address]    CT CHEST ABDOMEN PELVIS W CONTRAST  Result Date: 08/31/2022 CLINICAL  DATA:  Sepsis EXAM: CT CHEST, ABDOMEN, AND PELVIS WITH CONTRAST TECHNIQUE: Multidetector CT imaging of the chest, abdomen and pelvis was performed following the standard protocol during bolus administration of intravenous contrast. RADIATION DOSE REDUCTION: This exam was performed according to the departmental dose-optimization program which includes automated exposure control, adjustment of the mA and/or kV according to patient size and/or use of iterative reconstruction technique. CONTRAST:  75mL OMNIPAQUE IOHEXOL 350 MG/ML SOLN COMPARISON:  None Available. FINDINGS: CHEST: Cardiovascular: No aortic injury. The thoracic aorta is normal in caliber. The heart is normal in size. No significant pericardial effusion. Severe atherosclerotic plaque. The main pulmonary artery is normal in caliber. Four-vessel coronary calcifications status post coronary artery bypass graft. No central pulmonary embolus. Mediastinum/Nodes: No pneumomediastinum. No mediastinal hematoma. The esophagus is unremarkable. The thyroid is unremarkable. The central airways are patent. No mediastinal, hilar, or axillary lymphadenopathy. Lungs/Pleura: Emphysematous changes. No focal consolidation. No pulmonary nodule. No pulmonary mass. No pulmonary contusion or  laceration. No pneumatocele formation. No pleural effusion. No pneumothorax. No hemothorax. Musculoskeletal/Chest wall: No chest wall mass.  Trace bilateral gynecomastia. No acute rib or sternal fracture. Chronic T7, T8, and T9 spinous process fractures. Associated chronic appearing burst fracture of the T9 vertebral body. No retropulsion into the central canal. Approximately at least 40% vertebral body height loss centrally. ABDOMEN / PELVIS: Hepatobiliary: Not enlarged. Vague slightly nodular hypodensity along the falciform ligament also noted on delayed imaging suggestive of focal fatty infiltration. No laceration or subcapsular hematoma. Cholelithiasis.  No biliary ductal dilatation. Pancreas: Normal pancreatic contour. No main pancreatic duct dilatation. Spleen: Not enlarged. No focal lesion. No laceration, subcapsular hematoma, or vascular injury. Adrenals/Urinary Tract: No nodularity bilaterally. Calcification associated with the kidneys likely vascular. No obstructive nephrolithiasis. Bilateral kidneys enhance symmetrically. No hydronephrosis. No contusion, laceration, or subcapsular hematoma. No injury to the vascular structures or collecting systems. No hydroureter. The urinary bladder is unremarkable. Stomach/Bowel: No small or large bowel wall thickening or dilatation. The appendix is not definitely identified with no inflammatory changes in the right lower quadrant to suggest acute appendicitis. Vasculature/Lymphatics: Severe atherosclerotic plaque. No abdominal aorta or iliac aneurysm. No active contrast extravasation or pseudoaneurysm. No abdominal, pelvic, inguinal lymphadenopathy. Reproductive: Normal. Other: No simple free fluid ascites. No pneumoperitoneum. No hemoperitoneum. No mesenteric hematoma identified. No organized fluid collection. Musculoskeletal: No significant soft tissue hematoma. No acute pelvic fracture.  No acute spinal fracture. Ports and Devices: None. IMPRESSION: 1. No acute  intrathoracic, intra-abdominal, intrapelvic traumatic injury. 2. Chronic T9 burst fracture-correlate with point tenderness to palpation to evaluate for an acute component. Otherwise no definite acute fracture or traumatic malalignment of the thoracic or lumbar spine. 3. Other imaging findings of potential clinical significance: Cholelithiasis with no acute cholecystitis. Stool throughout the colon-correlate for constipation. Aortic Atherosclerosis (ICD10-I70.0) and Emphysema (ICD10-J43.9). Electronically Signed   By: Tish Frederickson M.D.   On: 08/31/2022 22:57   CT Head Wo Contrast  Result Date: 08/31/2022 CLINICAL DATA:  Neck trauma (Age >= 65y); Mental status change, unknown cause EXAM: CT HEAD WITHOUT CONTRAST CT CERVICAL SPINE WITHOUT CONTRAST TECHNIQUE: Multidetector CT imaging of the head and cervical spine was performed following the standard protocol without intravenous contrast. Multiplanar CT image reconstructions of the cervical spine were also generated. RADIATION DOSE REDUCTION: This exam was performed according to the departmental dose-optimization program which includes automated exposure control, adjustment of the mA and/or kV according to patient size and/or use of iterative reconstruction technique. COMPARISON:  CT head and C-spine 10/23/2021  FINDINGS: CT HEAD FINDINGS Brain: Cerebral ventricle sizes are concordant with the degree of cerebral volume loss. Trace patchy and confluent areas of decreased attenuation are noted throughout the deep and periventricular white matter of the cerebral hemispheres bilaterally, compatible with chronic microvascular ischemic disease. No evidence of large-territorial acute infarction. No parenchymal hemorrhage. No mass lesion. No extra-axial collection. No mass effect or midline shift. No hydrocephalus. Basilar cisterns are patent. Vascular: No hyperdense vessel. Skull: No acute fracture or focal lesion. Sinuses/Orbits: Paranasal sinuses and mastoid air cells  are clear. The orbits are unremarkable. Other: 6 mm left frontal scalp hematoma. 13 mm right parieto-occipital scalp hematoma formation. CT CERVICAL SPINE FINDINGS Alignment: Normal. Skull base and vertebrae: Chronic C2 dens fracture with increased separation of the fracture fragments now up to 3 mm (from less than 1 mm). No aggressive appearing focal osseous lesion or focal pathologic process. Soft tissues and spinal canal: No prevertebral fluid or swelling. No visible canal hematoma. Upper chest: Emphysematous changes. Other: Atherosclerotic plaque of the carotid arteries within the neck. IMPRESSION: 1.  No acute intracranial abnormality. 2. Chronic C2 dens fracture with increased separation of the fracture fragments up to 3 mm (from less than 1 mm). Findings may be acute versus chronic. If clinically indicated, consider MRI cervical spine for further evaluation. 3. Otherwise no acute displaced fracture or traumatic listhesis of the cervical spine. 4. A 6 mm left frontal scalp hematoma. 5. A 13 mm right parieto-occipital scalp hematoma formation. 6. Emphysema (ICD10-J43.9). Electronically Signed   By: Tish Frederickson M.D.   On: 08/31/2022 22:37   CT Cervical Spine Wo Contrast  Result Date: 08/31/2022 CLINICAL DATA:  Neck trauma (Age >= 65y); Mental status change, unknown cause EXAM: CT HEAD WITHOUT CONTRAST CT CERVICAL SPINE WITHOUT CONTRAST TECHNIQUE: Multidetector CT imaging of the head and cervical spine was performed following the standard protocol without intravenous contrast. Multiplanar CT image reconstructions of the cervical spine were also generated. RADIATION DOSE REDUCTION: This exam was performed according to the departmental dose-optimization program which includes automated exposure control, adjustment of the mA and/or kV according to patient size and/or use of iterative reconstruction technique. COMPARISON:  CT head and C-spine 10/23/2021 FINDINGS: CT HEAD FINDINGS Brain: Cerebral ventricle  sizes are concordant with the degree of cerebral volume loss. Trace patchy and confluent areas of decreased attenuation are noted throughout the deep and periventricular white matter of the cerebral hemispheres bilaterally, compatible with chronic microvascular ischemic disease. No evidence of large-territorial acute infarction. No parenchymal hemorrhage. No mass lesion. No extra-axial collection. No mass effect or midline shift. No hydrocephalus. Basilar cisterns are patent. Vascular: No hyperdense vessel. Skull: No acute fracture or focal lesion. Sinuses/Orbits: Paranasal sinuses and mastoid air cells are clear. The orbits are unremarkable. Other: 6 mm left frontal scalp hematoma. 13 mm right parieto-occipital scalp hematoma formation. CT CERVICAL SPINE FINDINGS Alignment: Normal. Skull base and vertebrae: Chronic C2 dens fracture with increased separation of the fracture fragments now up to 3 mm (from less than 1 mm). No aggressive appearing focal osseous lesion or focal pathologic process. Soft tissues and spinal canal: No prevertebral fluid or swelling. No visible canal hematoma. Upper chest: Emphysematous changes. Other: Atherosclerotic plaque of the carotid arteries within the neck. IMPRESSION: 1.  No acute intracranial abnormality. 2. Chronic C2 dens fracture with increased separation of the fracture fragments up to 3 mm (from less than 1 mm). Findings may be acute versus chronic. If clinically indicated, consider MRI cervical spine for further evaluation.  3. Otherwise no acute displaced fracture or traumatic listhesis of the cervical spine. 4. A 6 mm left frontal scalp hematoma. 5. A 13 mm right parieto-occipital scalp hematoma formation. 6. Emphysema (ICD10-J43.9). Electronically Signed   By: Tish Frederickson M.D.   On: 08/31/2022 22:37   DG Pelvis Portable  Result Date: 08/31/2022 CLINICAL DATA:  Fall, weakness. EXAM: PORTABLE PELVIS 1-2 VIEWS COMPARISON:  None Available. FINDINGS: The cortical margins  of the bony pelvis are intact. No fracture. Pubic symphysis and sacroiliac joints are congruent. Both femoral heads are well-seated in the respective acetabula. External artifact projects over the lateral left hip. Extensive arterial vascular calcifications. IMPRESSION: No pelvic fracture. Extensive arterial vascular calcifications. Electronically Signed   By: Narda Rutherford M.D.   On: 08/31/2022 20:38   DG Chest Port 1 View  Result Date: 08/31/2022 CLINICAL DATA:  Weakness nose, fall. EXAM: PORTABLE CHEST 1 VIEW COMPARISON:  Radiograph 06/08/2015 FINDINGS: Prior median sternotomy and CABG. Stable heart size allowing for lower lung volumes. Unchanged mediastinal contours. Aortic atherosclerosis. Patchy airspace opacity at the retrocardiac left lung base. No pneumothorax or large pleural effusion. On limited assessment, no acute osseous abnormalities are seen. IMPRESSION: 1. Patchy airspace opacity at the retrocardiac left lung base. This may represent pneumonia, although possibility of pulmonary contusion is considered in the setting of fall. 2. Prior CABG. Electronically Signed   By: Narda Rutherford M.D.   On: 08/31/2022 20:37    Pending Labs Unresulted Labs (From admission, onward)     Start     Ordered   09/01/22 0500  Comprehensive metabolic panel  Tomorrow morning,   R        09/01/22 0033   09/01/22 0500  CBC  Tomorrow morning,   R        09/01/22 0033   09/01/22 0045  Lactic acid, plasma  Now then every 2 hours,   R      08/31/22 2336   09/01/22 0026  TSH  Once,   R        09/01/22 0033   09/01/22 0026  T4, free  Once,   R        09/01/22 0033   08/31/22 2352  Magnesium  Add-on,   AD        08/31/22 2351   08/31/22 2153  MRSA Next Gen by PCR, Nasal  Once,   URGENT        08/31/22 2153   08/31/22 2004  Blood Culture (routine x 2)  (Undifferentiated presentation (screening labs and basic nursing orders))  BLOOD CULTURE X 2,   STAT      08/31/22 2004             Vitals/Pain Today's Vitals   08/31/22 2300 09/01/22 0030 09/01/22 0050 09/01/22 0050  BP: (!) 145/77 (!) 143/93    Pulse: (!) 113 (!) 109    Resp: (!) 23 (!) 24    Temp:   98.1 F (36.7 C) 98 F (36.7 C)  TempSrc:   Axillary Oral  SpO2: 95% 97%    Weight:      Height:      PainSc:  0-No pain      Isolation Precautions No active isolations  Medications Medications  ceFEPIme (MAXIPIME) 2 g in sodium chloride 0.9 % 100 mL IVPB (has no administration in time range)  vancomycin (VANCOREADY) IVPB 750 mg/150 mL (has no administration in time range)  lactated ringers infusion ( Intravenous New Bag/Given 08/31/22 2349)  sodium chloride flush (NS) 0.9 % injection 3 mL (has no administration in time range)  nicotine (NICODERM CQ - dosed in mg/24 hours) patch 14 mg (has no administration in time range)  hydrALAZINE (APRESOLINE) injection 5 mg (has no administration in time range)  acetaminophen (TYLENOL) suppository 650 mg (650 mg Rectal Given 08/31/22 2053)  ceFEPIme (MAXIPIME) 2 g in sodium chloride 0.9 % 100 mL IVPB (0 g Intravenous Stopped 08/31/22 2138)  metroNIDAZOLE (FLAGYL) IVPB 500 mg (0 mg Intravenous Stopped 08/31/22 2239)  vancomycin (VANCOREADY) IVPB 1500 mg/300 mL (0 mg Intravenous Stopped 09/01/22 0012)  iohexol (OMNIPAQUE) 350 MG/ML injection 75 mL (75 mLs Intravenous Contrast Given 08/31/22 2221)  lactated ringers bolus 500 mL (500 mLs Intravenous New Bag/Given 08/31/22 2356)    Mobility walks     Focused Assessments Neuro Assessment Handoff:  Swallow screen pass? No  Cardiac Rhythm: Atrial fibrillation       Neuro Assessment:   Neuro Checks:      Has TPA been given? No If patient is a Neuro Trauma and patient is going to OR before floor call report to 4N Charge nurse: [phone] or [phone]   R Recommendations: See Admitting Provider Note  Report given to:   Additional Notes: \

## 2022-09-01 NOTE — Assessment & Plan Note (Addendum)
2/2  metabolic encephalopathy. Pt has AMS found on floor not clear if inial event was syncope or TIA We will admit to tele and cont monitoring. Neurochecks.  Avoid drugs with potential sedating effect or toxicity effect. Will give patient single dose of thiamine.

## 2022-09-01 NOTE — Assessment & Plan Note (Signed)
Vitals:   09/01/22 0235 09/01/22 0300 09/01/22 0400 09/01/22 0500  BP: (!) 152/82 133/67 132/74 (!) 145/82  Pulse: (!) 106 (!) 102 (!) 106 98  Temp:      Resp: 20 15 14  (!) 21  Height:      Weight:      SpO2: 95% 92% 94% 95%  TempSrc:      BMI (Calculated):      Urinalysis    Component Value Date/Time   COLORURINE YELLOW 08/31/2022 2050   APPEARANCEUR HAZY (A) 08/31/2022 2050   LABSPEC 1.016 08/31/2022 2050   PHURINE 7.0 08/31/2022 2050   GLUCOSEU NEGATIVE 08/31/2022 2050   GLUCOSEU NEGATIVE 12/07/2021 1203   HGBUR MODERATE (A) 08/31/2022 2050   BILIRUBINUR NEGATIVE 08/31/2022 2050   KETONESUR 20 (A) 08/31/2022 2050   PROTEINUR 100 (A) 08/31/2022 2050   UROBILINOGEN 0.2 12/07/2021 1203   NITRITE POSITIVE (A) 08/31/2022 2050   LEUKOCYTESUR NEGATIVE 08/31/2022 2050  We will continue cefepime.

## 2022-09-01 NOTE — Consult Note (Signed)
CARDIOLOGY CONSULT NOTE       Patient ID: [name] MRN: [id number] DOB/AGE: 84-04-1940 82 y.o.  Admit date: 08/31/2022 Referring Physician: Mahala Menghini Primary Physician: Tresa Garter, MD Primary Cardiologist: Clifton James Reason for Consultation: Elevated troponin   Principal Problem:   AMS (altered mental status) Active Problems:   History of transient ischemic attack (TIA)   CAD S/P LM and CFX PCI 05/04/14   ICM-EF 45-50% by echo 04/30/14   PVD (peripheral vascular disease) with claudication (HCC)   COPD (chronic obstructive pulmonary disease) (HCC)   Nicotine dependence   Odontoid fracture (HCC)   Chronic atrial fibrillation with RVR (HCC)   Rhabdomyolysis   AKI (acute kidney injury) (HCC)   Abnormal LFTs   Sepsis secondary to UTI [facility])   HPI:  82 y.o. admitted with AMS, likely down at home for a week with rhabdomyolysis and sepsis. History of falls and orthostasis. Prior CABG in 1993 and redo in 2016  No recent stress testing. Unable to elicit history from patient. Troponin flat 208->210->220. ECG non acute ST segments with rapid afib. History of PAF and flutter on Eliquis at home In addition was on ASA/Plavix He is active smoker with COPD Also has PVD and 40-59% RICA stenosis ABI's 0.94/0.95 on 05/02/22 with history of PTA and atherectomy of bilateral SFAls in 2011.    ROS All other systems reviewed and negative except as noted above  Past Medical History:  Diagnosis Date   ALLERGIC RHINITIS    Atrial flutter (HCC)    CAD (coronary artery disease)    CAD s/p CABG 1993 and redo 2016,   Carotid artery disease (HCC)    bilateral   Chronic combined systolic and diastolic CHF (congestive heart failure) (HCC)    CKD (chronic kidney disease), stage II    COPD (chronic obstructive pulmonary disease) (HCC)    Diverticulosis    Hyperlipidemia    Osteoarthritis    PAF (paroxysmal atrial fibrillation) (HCC)    Peripheral neuropathy    Personal history of  prostate cancer    PVD (peripheral vascular disease) with claudication (HCC)    Radiation proctitis    STEMI (ST elevation myocardial infarction) (HCC) 04/29/2014   PTCA L[place] and CFX, in setting of VF/VT arrest and CGS   Tobacco use disorder, continuous    Ventral hernia     Family History  Problem Relation Age of Onset   Heart disease Father    Coronary artery disease Father    Lung cancer Sister    Heart disease Brother    Hypertension Unknown     Social History   Socioeconomic History   Marital status: Married    Spouse name: Not on file   Number of children: 3   Years of education: Not on file   Highest education level: Not on file  Occupational History   Occupation: Retired-self employed    Employer: RETIRED  Tobacco Use   Smoking status: Every Day    Years: 40    Types: Cigarettes   Smokeless tobacco: Never  Vaping Use   Vaping Use: Never used  Substance and Sexual Activity   Alcohol use: No   Drug use: No   Sexual activity: Not Currently  Other Topics Concern   Not on file  Social History Narrative   Not on file   Social Determinants of Health   Financial Resource Strain: Low Risk  (12/05/2021)   Overall Financial Resource Strain (CARDIA)    Difficulty of Paying Living  Expenses: Not hard at all  Food Insecurity: No Food Insecurity (12/05/2021)   Hunger Vital Sign    Worried About Running Out of Food in the Last Year: Never true    Ran Out of Food in the Last Year: Never true  Transportation Needs: No Transportation Needs (12/05/2021)   PRAPARE - Administrator, Civil Service (Medical): No    Lack of Transportation (Non-Medical): No  Physical Activity: Sufficiently Active (12/05/2021)   Exercise Vital Sign    Days of Exercise per Week: 5 days    Minutes of Exercise per Session: 30 min  Stress: No Stress Concern Present (12/05/2021)   [employer] of Occupational Health - Occupational Stress Questionnaire    Feeling of Stress : Not at all   Social Connections: Socially Integrated (12/05/2021)   Social Connection and Isolation Panel [NHANES]    Frequency of Communication with Friends and Family: More than three times a week    Frequency of Social Gatherings with Friends and Family: More than three times a week    Attends Religious Services: More than 4 times per year    Active Member of Clubs or Organizations: Yes    Attends Banker Meetings: More than 4 times per year    Marital Status: Married  Catering manager Violence: Not At Risk (12/05/2021)   Humiliation, Afraid, Rape, and Kick questionnaire    Fear of Current or Ex-Partner: No    Emotionally Abused: No    Physically Abused: No    Sexually Abused: No    Past Surgical History:  Procedure Laterality Date   APPENDECTOMY     CARDIOVERSION N/A 06/24/2014   Procedure: CARDIOVERSION;  Surgeon: Chrystie Nose, MD;  Location: [facility] ENDOSCOPY;  Service: Cardiovascular;  Laterality: N/A;   CORONARY ARTERY BYPASS GRAFT  1992   CORONARY ARTERY BYPASS GRAFT N/A 05/28/2014   Procedure: REDO CORONARY ARTERY BYPASS GRAFTING (CABG);  Surgeon: Delight Ovens, MD;  Location: [facility] OR;  Service: Open Heart Surgery;  Laterality: N/A;  Times 4 using right internal mammary artery to Intermediate artery and endoscopically harvested left saphenous vein to LAD, OM1, and PD coronary arteries.   LEFT HEART CATHETERIZATION WITH CORONARY ANGIOGRAM N/A 04/30/2014   Procedure: LEFT HEART CATHETERIZATION WITH CORONARY ANGIOGRAM;  Surgeon: Demauri Advincula M [place], MD; Lmain 90% (s/p PTCA), LAD 80%, CFX 100% (s/p PTCA), RCA OK, LIMA-LAD atretic, SVG-OM 100% (chronic), EF 45%, pt req defib x 13 for VF/VT arrest, CHB w/ tem pacer, IABP and intubation   LUMBAR LAMINECTOMY  [phone]    X2   PROSTATECTOMY  05/2008   Dr. Laverle Patter and XRT   TEE WITHOUT CARDIOVERSION N/A 05/28/2014   Procedure: TRANSESOPHAGEAL ECHOCARDIOGRAM (TEE);  Surgeon: Delight Ovens, MD;  Location: [facility] OR;  Service: Open Heart Surgery;   Laterality: N/A;   TEE WITHOUT CARDIOVERSION N/A 06/24/2014   Procedure: TRANSESOPHAGEAL ECHOCARDIOGRAM (TEE);  Surgeon: Chrystie Nose, MD;  Location: [facility] ENDOSCOPY;  Service: Cardiovascular;  Laterality: N/A;   TONSILLECTOMY        Current Facility-Administered Medications:    ceFEPIme (MAXIPIME) 2 g in sodium chloride 0.9 % 100 mL IVPB, 2 g, Intravenous, Q12H, Daylene Posey, RPH, Last Rate: 200 mL/hr at 09/01/22 0826, 2 g at 09/01/22 5409   Chlorhexidine Gluconate Cloth 2 % PADS 6 each, 6 each, Topical, Daily, Gertha Calkin, MD, 6 each at 09/01/22 0844   hydrALAZINE (APRESOLINE) injection 5 mg, 5 mg, Intravenous, Q4H PRN, Gertha Calkin, MD  lactated ringers infusion, , Intravenous, Continuous, Pricilla Loveless, MD, Last Rate: 100 mL/hr at 09/01/22 0559, Infusion Verify at 09/01/22 0559   metoprolol tartrate (LOPRESSOR) injection 2.5 mg, 2.5 mg, Intravenous, Q4H, Irena Cords V, MD, 2.5 mg at 09/01/22 0817   nicotine (NICODERM CQ - dosed in mg/24 hours) patch 14 mg, 14 mg, Transdermal, Daily, Irena Cords V, MD, 14 mg at 09/01/22 0815   sodium chloride flush (NS) 0.9 % injection 3 mL, 3 mL, Intravenous, Q12H, Gertha Calkin, MD, 3 mL at 09/01/22 0820   thiamine (VITAMIN B1) injection 100 mg, 100 mg, Intravenous, Daily, Irena Cords V, MD, 100 mg at 09/01/22 0816   vancomycin (VANCOREADY) IVPB 750 mg/150 mL, 750 mg, Intravenous, Q12H, Daylene Posey, RPH  Chlorhexidine Gluconate Cloth  6 each Topical Daily   metoprolol tartrate  2.5 mg Intravenous Q4H   nicotine  14 mg Transdermal Daily   sodium chloride flush  3 mL Intravenous Q12H   thiamine (VITAMIN B1) injection  100 mg Intravenous Daily    ceFEPime (MAXIPIME) IV 2 g (09/01/22 0826)   lactated ringers 100 mL/hr at 09/01/22 0559   vancomycin      Physical Exam: Blood pressure 132/63, pulse 99, temperature 98.4 F (36.9 C), temperature source Axillary, resp. rate 20, height 6' (1.829 m), weight 73.7 kg, SpO2 92 %.    Chronically  ill Poor dentition Lethargic Exp wheezes with COPD Abdomen benign Palpable pedal pulses No edema Sacral decubitus   Labs:   Lab Results  Component Value Date   WBC 10.1 09/01/2022   HGB 11.8 (L) 09/01/2022   HCT 35.0 (L) 09/01/2022   MCV 97.8 09/01/2022   PLT 246 09/01/2022    Recent Labs  Lab 09/01/22 0131  NA 141  K 3.2*  CL 107  CO2 22  BUN 31*  CREATININE 1.09  CALCIUM 8.8*  PROT 4.5*  BILITOT 1.3*  ALKPHOS 43  ALT 43  AST 93*  GLUCOSE 142*   Lab Results  Component Value Date   CKTOTAL 4,821 (H) 08/31/2022   TROPONINI 0.24 (H) 05/21/2014    Lab Results  Component Value Date   CHOL 120 12/07/2021   CHOL 107 08/02/2020   CHOL 114 02/01/2020   Lab Results  Component Value Date   HDL 37.60 (L) 12/07/2021   HDL 36.80 (L) 08/02/2020   HDL 34.80 (L) 02/01/2020   Lab Results  Component Value Date   LDLCALC 54 12/07/2021   LDLCALC 39 08/02/2020   LDLCALC 46 02/01/2020   Lab Results  Component Value Date   TRIG 143.0 12/07/2021   TRIG 156.0 (H) 08/02/2020   TRIG 166.0 (H) 02/01/2020   Lab Results  Component Value Date   CHOLHDL 3 12/07/2021   CHOLHDL 3 08/02/2020   CHOLHDL 3 02/01/2020   No results found for: "LDLDIRECT"    Radiology: CT CHEST ABDOMEN PELVIS W CONTRAST  Result Date: 08/31/2022 CLINICAL DATA:  Sepsis EXAM: CT CHEST, ABDOMEN, AND PELVIS WITH CONTRAST TECHNIQUE: Multidetector CT imaging of the chest, abdomen and pelvis was performed following the standard protocol during bolus administration of intravenous contrast. RADIATION DOSE REDUCTION: This exam was performed according to the departmental dose-optimization program which includes automated exposure control, adjustment of the mA and/or kV according to patient size and/or use of iterative reconstruction technique. CONTRAST:  75mL OMNIPAQUE IOHEXOL 350 MG/ML SOLN COMPARISON:  None Available. FINDINGS: CHEST: Cardiovascular: No aortic injury. The thoracic aorta is normal in caliber.  The heart is normal in size.  No significant pericardial effusion. Severe atherosclerotic plaque. The main pulmonary artery is normal in caliber. Four-vessel coronary calcifications status post coronary artery bypass graft. No central pulmonary embolus. Mediastinum/Nodes: No pneumomediastinum. No mediastinal hematoma. The esophagus is unremarkable. The thyroid is unremarkable. The central airways are patent. No mediastinal, hilar, or axillary lymphadenopathy. Lungs/Pleura: Emphysematous changes. No focal consolidation. No pulmonary nodule. No pulmonary mass. No pulmonary contusion or laceration. No pneumatocele formation. No pleural effusion. No pneumothorax. No hemothorax. Musculoskeletal/Chest wall: No chest wall mass.  Trace bilateral gynecomastia. No acute rib or sternal fracture. Chronic T7, T8, and T9 spinous process fractures. Associated chronic appearing burst fracture of the T9 vertebral body. No retropulsion into the central canal. Approximately at least 40% vertebral body height loss centrally. ABDOMEN / PELVIS: Hepatobiliary: Not enlarged. Vague slightly nodular hypodensity along the falciform ligament also noted on delayed imaging suggestive of focal fatty infiltration. No laceration or subcapsular hematoma. Cholelithiasis.  No biliary ductal dilatation. Pancreas: Normal pancreatic contour. No main pancreatic duct dilatation. Spleen: Not enlarged. No focal lesion. No laceration, subcapsular hematoma, or vascular injury. Adrenals/Urinary Tract: No nodularity bilaterally. Calcification associated with the kidneys likely vascular. No obstructive nephrolithiasis. Bilateral kidneys enhance symmetrically. No hydronephrosis. No contusion, laceration, or subcapsular hematoma. No injury to the vascular structures or collecting systems. No hydroureter. The urinary bladder is unremarkable. Stomach/Bowel: No small or large bowel wall thickening or dilatation. The appendix is not definitely identified with no  inflammatory changes in the right lower quadrant to suggest acute appendicitis. Vasculature/Lymphatics: Severe atherosclerotic plaque. No abdominal aorta or iliac aneurysm. No active contrast extravasation or pseudoaneurysm. No abdominal, pelvic, inguinal lymphadenopathy. Reproductive: Normal. Other: No simple free fluid ascites. No pneumoperitoneum. No hemoperitoneum. No mesenteric hematoma identified. No organized fluid collection. Musculoskeletal: No significant soft tissue hematoma. No acute pelvic fracture.  No acute spinal fracture. Ports and Devices: None. IMPRESSION: 1. No acute intrathoracic, intra-abdominal, intrapelvic traumatic injury. 2. Chronic T9 burst fracture-correlate with point tenderness to palpation to evaluate for an acute component. Otherwise no definite acute fracture or traumatic malalignment of the thoracic or lumbar spine. 3. Other imaging findings of potential clinical significance: Cholelithiasis with no acute cholecystitis. Stool throughout the colon-correlate for constipation. Aortic Atherosclerosis (ICD10-I70.0) and Emphysema (ICD10-J43.9). Electronically Signed   By: Tish Frederickson M.D.   On: 08/31/2022 22:57   CT Head Wo Contrast  Result Date: 08/31/2022 CLINICAL DATA:  Neck trauma (Age >= 65y); Mental status change, unknown cause EXAM: CT HEAD WITHOUT CONTRAST CT CERVICAL SPINE WITHOUT CONTRAST TECHNIQUE: Multidetector CT imaging of the head and cervical spine was performed following the standard protocol without intravenous contrast. Multiplanar CT image reconstructions of the cervical spine were also generated. RADIATION DOSE REDUCTION: This exam was performed according to the departmental dose-optimization program which includes automated exposure control, adjustment of the mA and/or kV according to patient size and/or use of iterative reconstruction technique. COMPARISON:  CT head and C-spine 10/23/2021 FINDINGS: CT HEAD FINDINGS Brain: Cerebral ventricle sizes are  concordant with the degree of cerebral volume loss. Trace patchy and confluent areas of decreased attenuation are noted throughout the deep and periventricular white matter of the cerebral hemispheres bilaterally, compatible with chronic microvascular ischemic disease. No evidence of large-territorial acute infarction. No parenchymal hemorrhage. No mass lesion. No extra-axial collection. No mass effect or midline shift. No hydrocephalus. Basilar cisterns are patent. Vascular: No hyperdense vessel. Skull: No acute fracture or focal lesion. Sinuses/Orbits: Paranasal sinuses and mastoid air cells are clear. The orbits are unremarkable. Other: 6  mm left frontal scalp hematoma. 13 mm right parieto-occipital scalp hematoma formation. CT CERVICAL SPINE FINDINGS Alignment: Normal. Skull base and vertebrae: Chronic C2 dens fracture with increased separation of the fracture fragments now up to 3 mm (from less than 1 mm). No aggressive appearing focal osseous lesion or focal pathologic process. Soft tissues and spinal canal: No prevertebral fluid or swelling. No visible canal hematoma. Upper chest: Emphysematous changes. Other: Atherosclerotic plaque of the carotid arteries within the neck. IMPRESSION: 1.  No acute intracranial abnormality. 2. Chronic C2 dens fracture with increased separation of the fracture fragments up to 3 mm (from less than 1 mm). Findings may be acute versus chronic. If clinically indicated, consider MRI cervical spine for further evaluation. 3. Otherwise no acute displaced fracture or traumatic listhesis of the cervical spine. 4. A 6 mm left frontal scalp hematoma. 5. A 13 mm right parieto-occipital scalp hematoma formation. 6. Emphysema (ICD10-J43.9). Electronically Signed   By: Tish Frederickson M.D.   On: 08/31/2022 22:37   CT Cervical Spine Wo Contrast  Result Date: 08/31/2022 CLINICAL DATA:  Neck trauma (Age >= 65y); Mental status change, unknown cause EXAM: CT HEAD WITHOUT CONTRAST CT CERVICAL  SPINE WITHOUT CONTRAST TECHNIQUE: Multidetector CT imaging of the head and cervical spine was performed following the standard protocol without intravenous contrast. Multiplanar CT image reconstructions of the cervical spine were also generated. RADIATION DOSE REDUCTION: This exam was performed according to the departmental dose-optimization program which includes automated exposure control, adjustment of the mA and/or kV according to patient size and/or use of iterative reconstruction technique. COMPARISON:  CT head and C-spine 10/23/2021 FINDINGS: CT HEAD FINDINGS Brain: Cerebral ventricle sizes are concordant with the degree of cerebral volume loss. Trace patchy and confluent areas of decreased attenuation are noted throughout the deep and periventricular white matter of the cerebral hemispheres bilaterally, compatible with chronic microvascular ischemic disease. No evidence of large-territorial acute infarction. No parenchymal hemorrhage. No mass lesion. No extra-axial collection. No mass effect or midline shift. No hydrocephalus. Basilar cisterns are patent. Vascular: No hyperdense vessel. Skull: No acute fracture or focal lesion. Sinuses/Orbits: Paranasal sinuses and mastoid air cells are clear. The orbits are unremarkable. Other: 6 mm left frontal scalp hematoma. 13 mm right parieto-occipital scalp hematoma formation. CT CERVICAL SPINE FINDINGS Alignment: Normal. Skull base and vertebrae: Chronic C2 dens fracture with increased separation of the fracture fragments now up to 3 mm (from less than 1 mm). No aggressive appearing focal osseous lesion or focal pathologic process. Soft tissues and spinal canal: No prevertebral fluid or swelling. No visible canal hematoma. Upper chest: Emphysematous changes. Other: Atherosclerotic plaque of the carotid arteries within the neck. IMPRESSION: 1.  No acute intracranial abnormality. 2. Chronic C2 dens fracture with increased separation of the fracture fragments up to 3 mm  (from less than 1 mm). Findings may be acute versus chronic. If clinically indicated, consider MRI cervical spine for further evaluation. 3. Otherwise no acute displaced fracture or traumatic listhesis of the cervical spine. 4. A 6 mm left frontal scalp hematoma. 5. A 13 mm right parieto-occipital scalp hematoma formation. 6. Emphysema (ICD10-J43.9). Electronically Signed   By: Tish Frederickson M.D.   On: 08/31/2022 22:37   DG Pelvis Portable  Result Date: 08/31/2022 CLINICAL DATA:  Fall, weakness. EXAM: PORTABLE PELVIS 1-2 VIEWS COMPARISON:  None Available. FINDINGS: The cortical margins of the bony pelvis are intact. No fracture. Pubic symphysis and sacroiliac joints are congruent. Both femoral heads are well-seated in the respective acetabula.  External artifact projects over the lateral left hip. Extensive arterial vascular calcifications. IMPRESSION: No pelvic fracture. Extensive arterial vascular calcifications. Electronically Signed   By: Narda Rutherford M.D.   On: 08/31/2022 20:38   DG Chest Port 1 View  Result Date: 08/31/2022 CLINICAL DATA:  Weakness nose, fall. EXAM: PORTABLE CHEST 1 VIEW COMPARISON:  Radiograph 06/08/2015 FINDINGS: Prior median sternotomy and CABG. Stable heart size allowing for lower lung volumes. Unchanged mediastinal contours. Aortic atherosclerosis. Patchy airspace opacity at the retrocardiac left lung base. No pneumothorax or large pleural effusion. On limited assessment, no acute osseous abnormalities are seen. IMPRESSION: 1. Patchy airspace opacity at the retrocardiac left lung base. This may represent pneumonia, although possibility of pulmonary contusion is considered in the setting of fall. 2. Prior CABG. Electronically Signed   By: Narda Rutherford M.D.   On: 08/31/2022 20:37    EKG: rapid afib no acute ST changes    ASSESSMENT AND PLAN:   Elevated Troponin:  history of CABG x 2 no mention of chest pain ECG non acute would observe Check TTE for EF No indication  for cath given comorbidities PAF:  start heparin pending issues with C spine, mental status and rhabdomyolysis resume home Toprol dose C spine:  chronic C2 dens fracture with more separation post presumed fall and scalp hematoma Neuro surgery consult pending  Pneumonia: likely aspiration left lung base on Maxipime    Signed: Charlton Haws 09/01/2022, 9:59 AM

## 2022-09-01 NOTE — Progress Notes (Signed)
ANTICOAGULATION CONSULT NOTE - Initial Consult  Pharmacy Consult for Heparin drip Indication: atrial fibrillation  Allergies  Allergen Reactions   Adhesive [Tape] Other (See Comments)    Takes skin off    Codeine Sulfate Itching and Other (See Comments)    headaches   Hydrocodone    Hydrocodone-Acetaminophen Other (See Comments)   Other Other (See Comments)    Other reaction(s): NAUSEA,VOMITING, Headache   Wound Dressing Adhesive     Other reaction(s): Skin irritation    Patient Measurements: Height: 6' (182.9 cm) Weight: 73.7 kg (162 lb 7.7 oz) IBW/kg (Calculated) : 77.6   Vital Signs: Temp: 97.8 F (36.6 C) (05/25 1151) Temp Source: Oral (05/25 1151) BP: 124/68 (05/25 1151) Pulse Rate: 120 (05/25 1151)  Labs: Recent Labs    08/31/22 2020 08/31/22 2122 08/31/22 2215 09/01/22 0131 09/01/22 0344  HGB 14.0 13.3  --  11.8*  --   HCT 40.0 39.0  --  35.0*  --   PLT 321  --   --  246  --   APTT 30  --   --   --   --   LABPROT 16.5*  --   --   --   --   INR 1.3*  --   --   --   --   CREATININE 1.31* 1.00  --  1.09  --   CKTOTAL 4,821*  --   --   --   --   TROPONINIHS 208*  --  210*  --  220*    Estimated Creatinine Clearance: 53.5 mL/min (by C-G formula based on SCr of 1.09 mg/dL).   Medical History: Past Medical History:  Diagnosis Date   ALLERGIC RHINITIS    Atrial flutter (HCC)    CAD (coronary artery disease)    CAD s/p CABG 1993 and redo 2016,   Carotid artery disease (HCC)    bilateral   Chronic combined systolic and diastolic CHF (congestive heart failure) (HCC)    CKD (chronic kidney disease), stage II    COPD (chronic obstructive pulmonary disease) (HCC)    Diverticulosis    Hyperlipidemia    Osteoarthritis    PAF (paroxysmal atrial fibrillation) (HCC)    Peripheral neuropathy    Personal history of prostate cancer    PVD (peripheral vascular disease) with claudication (HCC)    Radiation proctitis    STEMI (ST elevation myocardial  infarction) (HCC) 04/29/2014   PTCA [place] and CFX, in setting of VF/VT arrest and CGS   Tobacco use disorder, continuous    Ventral hernia       Assessment: 81yom with Hx Afib on apixaban pta.   Begin heparin drip for anticoagulation while apixaban on hold  Will monitor heparin drip rate with aptt as heparin level falsely elevated with apixaban use.   Goal of Therapy:  aPTT 66-100 seconds Heparin level 0.3-0.7  Monitor platelets by anticoagulation protocol: Yes   Plan:  Heparin drip 900 uts/hr  Aptt in 6hr  Daily aptt and cbc  Montior s/s bleeding Resume po anticoagulation when patient more stable    Leota Sauers Pharm.D. CPP, BCPS Clinical Pharmacist [phone] 09/01/2022 2:10 PM

## 2022-09-01 NOTE — Progress Notes (Signed)
ANTICOAGULATION CONSULT NOTE - Follow Up Consult  Pharmacy Consult for Heparin drip Indication: atrial fibrillation  Allergies  Allergen Reactions   Adhesive [Tape] Other (See Comments)    Takes skin off    Codeine Sulfate Itching and Other (See Comments)    headaches   Hydrocodone    Hydrocodone-Acetaminophen Other (See Comments)   Other Other (See Comments)    Other reaction(s): NAUSEA,VOMITING, Headache   Wound Dressing Adhesive     Other reaction(s): Skin irritation    Patient Measurements: Height: 6' (182.9 cm) Weight: 73.7 kg (162 lb 7.7 oz) IBW/kg (Calculated) : 77.6   Vital Signs: Temp: 98.5 F (36.9 C) (05/25 1943) Temp Source: Oral (05/25 1943) BP: 139/74 (05/25 1943) Pulse Rate: 100 (05/25 1943)  Labs: Recent Labs    08/31/22 2020 08/31/22 2122 08/31/22 2215 09/01/22 0131 09/01/22 0344 09/01/22 2053  HGB 14.0 13.3  --  11.8*  --   --   HCT 40.0 39.0  --  35.0*  --   --   PLT 321  --   --  246  --   --   APTT 30  --   --   --   --  43*  LABPROT 16.5*  --   --   --   --   --   INR 1.3*  --   --   --   --   --   CREATININE 1.31* 1.00  --  1.09  --   --   CKTOTAL 4,821*  --   --   --   --   --   TROPONINIHS 208*  --  210*  --  220*  --      Estimated Creatinine Clearance: 53.5 mL/min (by C-G formula based on SCr of 1.09 mg/dL).   Medical History: Past Medical History:  Diagnosis Date   ALLERGIC RHINITIS    Atrial flutter (HCC)    CAD (coronary artery disease)    CAD s/p CABG 1993 and redo 2016,   Carotid artery disease (HCC)    bilateral   Chronic combined systolic and diastolic CHF (congestive heart failure) (HCC)    CKD (chronic kidney disease), stage II    COPD (chronic obstructive pulmonary disease) (HCC)    Diverticulosis    Hyperlipidemia    Osteoarthritis    PAF (paroxysmal atrial fibrillation) (HCC)    Peripheral neuropathy    Personal history of prostate cancer    PVD (peripheral vascular disease) with claudication (HCC)     Radiation proctitis    STEMI (ST elevation myocardial infarction) (HCC) 04/29/2014   PTCA [place] and CFX, in setting of VF/VT arrest and CGS   Tobacco use disorder, continuous    Ventral hernia       Assessment: 81yom with Hx Afib on apixaban pta.   Begin heparin drip for anticoagulation while apixaban on hold  Will monitor heparin drip rate with aptt as heparin level falsely elevated with apixaban use.   aPTT 43 sec is subtherapeutic on heparin 900 units/hr. No pauses or interruptions to heparin infusion, no bleeding noted per discussion with RN  Goal of Therapy:  aPTT 66-100 seconds Heparin level 0.3-0.7  Monitor platelets by anticoagulation protocol: Yes   Plan:  Increase Heparin drip to 1050 units/hr  Aptt in 6hr  Daily aptt, heparin level and cbc  Montior s/s bleeding Resume po anticoagulation when patient more stable    Loralee Pacas, PharmD, BCPS 09/01/2022 9:50 PM   Please check AMION for  all [facility] phone numbers After 10:00 PM, call Main Pharmacy [phone]

## 2022-09-01 NOTE — Progress Notes (Signed)
  Echocardiogram Echo was attempted at 5:45pm. Patient was not available. If true STAT please call the on-call sonographer.  Seleste Tallman Wynn Banker 09/01/2022, 5:51 PM

## 2022-09-01 NOTE — H&P (Addendum)
History and Physical     Patient: [name] UJW:[id number] DOB: 06-24-1940 DOA: 08/31/2022 DOS: the patient was seen and examined on 09/01/2022 PCP: Plotnikov, [name], MD   Patient coming from:  Home  Chief Complaint: AMS. HISTORY OF PRESENT ILLNESS: [name] is an 82 y.o. male found on the floor with his wife. Per report patient's family had called to check on them and answer MD asked the neighbor to check on x 2 with no answer they called EMS and had forced entry as the door was locked and both wife and patient were on the floor. Patient was soiled in stool and urine.  Per report patient's wife was wearing the same clothes she had a week ago.  Patient was in a c-collar placed by EMS.  On arrival to the emergency room patient met sepsis criteria. Last 2D echocardiogram was in 2016.  Chart review shows patient does have a history of falls and orthostasis.  Past Medical History:  Diagnosis Date   ALLERGIC RHINITIS    Atrial flutter (HCC)    CAD (coronary artery disease)    CAD s/p CABG 1993 and redo 2016,   Carotid artery disease (HCC)    bilateral   Chronic combined systolic and diastolic CHF (congestive heart failure) (HCC)    CKD (chronic kidney disease), stage II    COPD (chronic obstructive pulmonary disease) (HCC)    Diverticulosis    Hyperlipidemia    Osteoarthritis    PAF (paroxysmal atrial fibrillation) (HCC)    Peripheral neuropathy    Personal history of prostate cancer    PVD (peripheral vascular disease) with claudication (HCC)    Radiation proctitis    STEMI (ST elevation myocardial infarction) (HCC) 04/29/2014   PTCA Lmain and CFX, in setting of VF/VT arrest and CGS   Tobacco use disorder, continuous    Ventral hernia    Review of Systems  Unable to perform ROS: Acuity of condition   Allergies  Allergen Reactions   Adhesive [Tape] Other (See Comments)    Takes skin off    Codeine Sulfate Itching and Other (See Comments)     headaches   Hydrocodone    Hydrocodone-Acetaminophen Other (See Comments)   Other Other (See Comments)    Other reaction(s): NAUSEA,VOMITING, Headache   Wound Dressing Adhesive     Other reaction(s): Skin irritation   Past Surgical History:  Procedure Laterality Date   APPENDECTOMY     CARDIOVERSION N/A 06/24/2014   Procedure: CARDIOVERSION;  Surgeon: Chrystie Nose, MD;  Location: [facility] ENDOSCOPY;  Service: Cardiovascular;  Laterality: N/A;   CORONARY ARTERY BYPASS GRAFT  1992   CORONARY ARTERY BYPASS GRAFT N/A 05/28/2014   Procedure: REDO CORONARY ARTERY BYPASS GRAFTING (CABG);  Surgeon: Delight Ovens, MD;  Location: [facility] OR;  Service: Open Heart Surgery;  Laterality: N/A;  Times 4 using right internal mammary artery to Intermediate artery and endoscopically harvested left saphenous vein to LAD, OM1, and PD coronary arteries.   LEFT HEART CATHETERIZATION WITH CORONARY ANGIOGRAM N/A 04/30/2014   Procedure: LEFT HEART CATHETERIZATION WITH CORONARY ANGIOGRAM;  Surgeon: Peter M Swaziland, MD; Lmain 90% (s/p PTCA), LAD 80%, CFX 100% (s/p PTCA), RCA OK, LIMA-LAD atretic, SVG-OM 100% (chronic), EF 45%, pt req defib x 13 for VF/VT arrest, CHB w/ tem pacer, IABP and intubation   LUMBAR LAMINECTOMY  5621,3086    X2   PROSTATECTOMY  05/2008   Dr. Laverle Patter and XRT   TEE WITHOUT CARDIOVERSION N/A 05/28/2014  Procedure: TRANSESOPHAGEAL ECHOCARDIOGRAM (TEE);  Surgeon: Delight Ovens, MD;  Location: [facility] OR;  Service: Open Heart Surgery;  Laterality: N/A;   TEE WITHOUT CARDIOVERSION N/A 06/24/2014   Procedure: TRANSESOPHAGEAL ECHOCARDIOGRAM (TEE);  Surgeon: Chrystie Nose, MD;  Location: [facility] ENDOSCOPY;  Service: Cardiovascular;  Laterality: N/A;   TONSILLECTOMY     MEDICATIONS: (Not in an outpatient encounter)    Current Facility-Administered Medications:    ceFEPIme (MAXIPIME) 2 g in sodium chloride 0.9 % 100 mL IVPB, 2 g, Intravenous, Q12H, Daylene Posey, RPH   Chlorhexidine Gluconate Cloth 2 % PADS 6  each, 6 each, Topical, Daily, Gertha Calkin, MD, 6 each at 09/01/22 0230   hydrALAZINE (APRESOLINE) injection 5 mg, 5 mg, Intravenous, Q4H PRN, Gertha Calkin, MD   lactated ringers infusion, , Intravenous, Continuous, Pricilla Loveless, MD, Last Rate: 100 mL/hr at 09/01/22 0559, Infusion Verify at 09/01/22 0559   metoprolol tartrate (LOPRESSOR) injection 2.5 mg, 2.5 mg, Intravenous, Q4H, Irena Cords V, MD, 2.5 mg at 09/01/22 0413   nicotine (NICODERM CQ - dosed in mg/24 hours) patch 14 mg, 14 mg, Transdermal, Daily, Allena Katz, Asael Pann V, MD   sodium chloride flush (NS) 0.9 % injection 3 mL, 3 mL, Intravenous, Q12H, Irena Cords V, MD, 3 mL at 09/01/22 0104   thiamine (VITAMIN B1) injection 100 mg, 100 mg, Intravenous, Daily, Gay Moncivais V, MD   vancomycin (VANCOREADY) IVPB 750 mg/150 mL, 750 mg, Intravenous, Q12H, Daylene Posey, [facility]    ED Course: Pt in Ed is arousable to voice opens his eyes spontaneously oriented to location and city cooperative. Vitals:   09/01/22 0300 09/01/22 0400 09/01/22 0500 09/01/22 0600  BP: 133/67 132/74 (!) 145/82 (!) 141/72  Pulse: (!) 102 (!) 106 98 (!) 105  Resp: 15 14 (!) 21 20  Temp:      TempSrc:      SpO2: 92% 94% 95% 96%  Weight:      Height:       No intake/output data recorded. SpO2: 96 % Blood work in ed shows: Patient meets severe sepsis criteria.  Due to urinary tract infection. Initial CMP shows hypokalemia of 3.3 AKI of 1.31 EGFR of 54. LFTs are elevated at AST 104 ALT 51 total protein 5.6 total bili of 1.5 CPK of 4821. Lactic acid of 2.3 and repeat of 2.6 Urinalysis shows nitrite positive urine with moderate hemoglobin and Patient in the emergency room given vancomycin and cefepime for  sepsis protocol. Results for orders placed or performed during the hospital encounter of 08/31/22 (from the past 72 hour(s))  Lactic acid, plasma     Status: Abnormal   Collection Time: 08/31/22  8:20 PM  Result Value Ref Range   Lactic Acid, Venous 2.3 (HH) 0.5  - 1.9 mmol/L    Comment: CRITICAL RESULT CALLED TO, READ BACK BY AND VERIFIED WITH W. HICKS RN 08/31/22 @2140  BY J. WHITE Performed at [facility], [address] [place], [place] [address]   Comprehensive metabolic panel     Status: Abnormal   Collection Time: 08/31/22  8:20 PM  Result Value Ref Range   Sodium 141 135 - 145 mmol/L   Potassium 3.3 (L) 3.5 - 5.1 mmol/L   Chloride 104 98 - 111 mmol/L   CO2 22 22 - 32 mmol/L   Glucose, Bld 161 (H) 70 - 99 mg/dL    Comment: Glucose reference range applies only to samples taken after fasting for at least 8 hours.   BUN  35 (H) 8 - 23 mg/dL   Creatinine, Ser 1.61 (H) 0.61 - 1.24 mg/dL   Calcium 9.6 8.9 - 09.6 mg/dL   Total Protein 5.6 (L) 6.5 - 8.1 g/dL   Albumin 3.0 (L) 3.5 - 5.0 g/dL   AST 045 (H) 15 - 41 U/L   ALT 51 (H) 0 - 44 U/L   Alkaline Phosphatase 55 38 - 126 U/L   Total Bilirubin 1.5 (H) 0.3 - 1.2 mg/dL   GFR, Estimated 54 (L) >60 mL/min    Comment: (NOTE) Calculated using the CKD-EPI Creatinine Equation (2021)    Anion gap 15 5 - 15    Comment: Performed at [facility], [address] [place], [place] [address]  CBC with Differential     Status: Abnormal   Collection Time: 08/31/22  8:20 PM  Result Value Ref Range   WBC 9.2 4.0 - 10.5 K/uL   RBC 4.13 (L) 4.22 - 5.81 MIL/uL   Hemoglobin 14.0 13.0 - 17.0 g/dL   HCT 19.1 47.8 - 29.5 %   MCV 96.9 80.0 - 100.0 fL   MCH 33.9 26.0 - 34.0 pg   MCHC 35.0 30.0 - 36.0 g/dL   RDW 62.1 30.8 - 65.7 %   Platelets 321 150 - 400 K/uL   nRBC 0.0 0.0 - 0.2 %   Neutrophils Relative % 86 %   Neutro Abs 8.0 (H) 1.7 - 7.7 K/uL   Lymphocytes Relative 3 %   Lymphs Abs 0.3 (L) 0.7 - 4.0 K/uL   Monocytes Relative 10 %   Monocytes Absolute 0.9 0.1 - 1.0 K/uL   Eosinophils Relative 0 %   Eosinophils Absolute 0.0 0.0 - 0.5 K/uL   Basophils Relative 0 %   Basophils Absolute 0.0 0.0 - 0.1 K/uL   Immature Granulocytes 1 %   Abs Immature Granulocytes 0.05 0.00 - 0.07 K/uL     Comment: Performed at [facility], [address] [place], [place] [address]  Protime-INR     Status: Abnormal   Collection Time: 08/31/22  8:20 PM  Result Value Ref Range   Prothrombin Time 16.5 (H) 11.4 - 15.2 seconds   INR 1.3 (H) 0.8 - 1.2    Comment: (NOTE) INR goal varies based on device and disease states. Performed at [facility], [address] [place], [place] [address]   APTT     Status: None   Collection Time: 08/31/22  8:20 PM  Result Value Ref Range   aPTT 30 24 - 36 seconds    Comment: Performed at [facility], [address], [place], [place] [address]  .Cooxemetry Panel (carboxy, met, total hgb, O2 sat)(NOT AT [facility] or [facility])     Status: Abnormal   Collection Time: 08/31/22  8:20 PM  Result Value Ref Range   Total hemoglobin 14.4 12.0 - 16.0 g/dL   O2 Saturation 40.1 %   Carboxyhemoglobin 2.0 (H) 0.5 - 1.5 %   Methemoglobin 0.9 0.0 - 1.5 %    Comment: Performed at [facility], [address] [place], [place] [address]  Troponin I (High Sensitivity)     Status: Abnormal   Collection Time: 08/31/22  8:20 PM  Result Value Ref Range   Troponin I (High Sensitivity) 208 (HH) <18 ng/L    Comment: CRITICAL RESULT CALLED TO, READ BACK BY AND VERIFIED WITH F. JONES RN 08/31/22 @2151  BY J. WHITE (NOTE) Elevated high sensitivity troponin I (hsTnI) values  and significant  changes across serial measurements may suggest ACS but many other  chronic and acute conditions are known to elevate hsTnI results.  Refer to the "Links" section for chest pain algorithms and additional  guidance. Performed at [facility], [address], [place], [place] [address]   CK     Status: Abnormal   Collection Time: 08/31/22  8:20 PM  Result Value Ref Range   Total CK 4,821 (H) 49 - 397 U/L    Comment: RESULT CONFIRMED BY MANUAL DILUTION Performed at [facility], [address] [place], [place] [address]   Urinalysis, w/ Reflex to Culture (Infection  Suspected) -Urine, Clean Catch     Status: Abnormal   Collection Time: 08/31/22  8:50 PM  Result Value Ref Range   Specimen Source URINE, CLEAN CATCH    Color, Urine YELLOW YELLOW   APPearance HAZY (A) CLEAR   Specific Gravity, Urine 1.016 1.005 - 1.030   pH 7.0 5.0 - 8.0   Glucose, UA NEGATIVE NEGATIVE mg/dL   Hgb urine dipstick MODERATE (A) NEGATIVE   Bilirubin Urine NEGATIVE NEGATIVE   Ketones, ur 20 (A) NEGATIVE mg/dL   Protein, ur 098 (A) NEGATIVE mg/dL   Nitrite POSITIVE (A) NEGATIVE   Leukocytes,Ua NEGATIVE NEGATIVE   RBC / HPF 0-5 0 - 5 RBC/hpf   WBC, UA 0-5 0 - 5 WBC/hpf    Comment:        Reflex urine culture not performed if WBC <=10, OR if Squamous epithelial cells >5. If Squamous epithelial cells >5 suggest recollection.    Bacteria, UA MANY (A) NONE SEEN   Squamous Epithelial / HPF 0-5 0 - 5 /HPF   Mucus PRESENT    Hyaline Casts, UA PRESENT     Comment: Performed at [facility], [address], [place], [place] [address]  I-Stat Chem 8, ED     Status: Abnormal   Collection Time: 08/31/22  9:22 PM  Result Value Ref Range   Sodium 144 135 - 145 mmol/L   Potassium 3.3 (L) 3.5 - 5.1 mmol/L   Chloride 106 98 - 111 mmol/L   BUN 33 (H) 8 - 23 mg/dL   Creatinine, Ser 7.82 0.61 - 1.24 mg/dL   Glucose, Bld 956 (H) 70 - 99 mg/dL    Comment: Glucose reference range applies only to samples taken after fasting for at least 8 hours.   Calcium, Ion 1.29 1.15 - 1.40 mmol/L   TCO2 23 22 - 32 mmol/L   Hemoglobin 13.3 13.0 - 17.0 g/dL   HCT 21.3 08.6 - 57.8 %  Troponin I (High Sensitivity)     Status: Abnormal   Collection Time: 08/31/22 10:15 PM  Result Value Ref Range   Troponin I (High Sensitivity) 210 (HH) <18 ng/L    Comment: CRITICAL VALUE NOTED. VALUE IS CONSISTENT WITH PREVIOUSLY REPORTED/CALLED VALUE (NOTE) Elevated high sensitivity troponin I (hsTnI) values and significant  changes across serial measurements may suggest ACS but many other  chronic and  acute conditions are known to elevate hsTnI results.  Refer to the "Links" section for chest pain algorithms and additional  guidance. Performed at [facility], [address], [place], [place] [address]   Lactic acid, plasma     Status: Abnormal   Collection Time: 08/31/22 10:45 PM  Result Value Ref Range   Lactic Acid, Venous 2.6 (HH) 0.5 - 1.9 mmol/L    Comment: CRITICAL VALUE NOTED. VALUE IS  CONSISTENT WITH PREVIOUSLY REPORTED/CALLED VALUE Performed at [facility], [address], [place], [place] [address]   Lactic acid, plasma     Status: Abnormal   Collection Time: 09/01/22  1:31 AM  Result Value Ref Range   Lactic Acid, Venous 3.6 (HH) 0.5 - 1.9 mmol/L    Comment: CRITICAL VALUE NOTED. VALUE IS CONSISTENT WITH PREVIOUSLY REPORTED/CALLED VALUE Performed at [facility], [address] [place], [place] [address]   Magnesium     Status: None   Collection Time: 09/01/22  1:31 AM  Result Value Ref Range   Magnesium 1.9 1.7 - 2.4 mg/dL    Comment: Performed at [facility], [address], [place], [place] [address]  TSH     Status: None   Collection Time: 09/01/22  1:31 AM  Result Value Ref Range   TSH 0.445 0.350 - 4.500 uIU/mL    Comment: Performed by a 3rd Generation assay with a functional sensitivity of <=0.01 uIU/mL. Performed at [facility], [address] [place], [place] [address]   Comprehensive metabolic panel     Status: Abnormal   Collection Time: 09/01/22  1:31 AM  Result Value Ref Range   Sodium 141 135 - 145 mmol/L   Potassium 3.2 (L) 3.5 - 5.1 mmol/L   Chloride 107 98 - 111 mmol/L   CO2 22 22 - 32 mmol/L   Glucose, Bld 142 (H) 70 - 99 mg/dL    Comment: Glucose reference range applies only to samples taken after fasting for at least 8 hours.   BUN 31 (H) 8 - 23 mg/dL   Creatinine, Ser 4.13 0.61 - 1.24 mg/dL   Calcium 8.8 (L) 8.9 - 10.3 mg/dL   Total Protein 4.5 (L) 6.5 - 8.1 g/dL   Albumin 2.4 (L) 3.5 - 5.0 g/dL   AST  93 (H) 15 - 41 U/L   ALT 43 0 - 44 U/L   Alkaline Phosphatase 43 38 - 126 U/L   Total Bilirubin 1.3 (H) 0.3 - 1.2 mg/dL   GFR, Estimated >24 >40 mL/min    Comment: (NOTE) Calculated using the CKD-EPI Creatinine Equation (2021)    Anion gap 12 5 - 15    Comment: Performed at [facility], [address], [place], [place] [address]  CBC     Status: Abnormal   Collection Time: 09/01/22  1:31 AM  Result Value Ref Range   WBC 10.1 4.0 - 10.5 K/uL   RBC 3.58 (L) 4.22 - 5.81 MIL/uL   Hemoglobin 11.8 (L) 13.0 - 17.0 g/dL   HCT 53.6 (L) 64.4 - 03.4 %   MCV 97.8 80.0 - 100.0 fL   MCH 33.0 26.0 - 34.0 pg   MCHC 33.7 30.0 - 36.0 g/dL   RDW 74.2 59.5 - 63.8 %   Platelets 246 150 - 400 K/uL   nRBC 0.0 0.0 - 0.2 %    Comment: Performed at [facility], [address], [place], [place] [address]  T4, free     Status: Abnormal   Collection Time: 09/01/22  1:31 AM  Result Value Ref Range   Free T4 1.20 (H) 0.61 - 1.12 ng/dL    Comment: (NOTE) Biotin ingestion may interfere with free T4 tests. If the results are inconsistent with the TSH level, previous test results, or the clinical presentation, then consider biotin interference. If needed, order repeat testing after stopping biotin. Performed at [facility],  [address], [place], [place] [address]   MRSA Next Gen by PCR, Nasal     Status: None   Collection Time: 09/01/22  3:13 AM   Specimen: Nasal Mucosa; Nasal Swab  Result Value Ref Range   MRSA by PCR Next Gen NOT DETECTED NOT DETECTED    Comment: (NOTE) The GeneXpert MRSA Assay (FDA approved for NASAL specimens only), is one component of a comprehensive MRSA colonization surveillance program. It is not intended to diagnose MRSA infection nor to guide or monitor treatment for MRSA infections. Test performance is not FDA approved in patients less than 66 years old. Performed at [facility], [address], [place], [place] [address]   Lactic acid, plasma      Status: None   Collection Time: 09/01/22  3:44 AM  Result Value Ref Range   Lactic Acid, Venous 1.9 0.5 - 1.9 mmol/L    Comment: Performed at [facility], [address] [place], [place] [address]  Troponin I (High Sensitivity)     Status: Abnormal   Collection Time: 09/01/22  3:44 AM  Result Value Ref Range   Troponin I (High Sensitivity) 220 (HH) <18 ng/L    Comment: CRITICAL VALUE NOTED. VALUE IS CONSISTENT WITH PREVIOUSLY REPORTED/CALLED VALUE (NOTE) Elevated high sensitivity troponin I (hsTnI) values and significant  changes across serial measurements may suggest ACS but many other  chronic and acute conditions are known to elevate hsTnI results.  Refer to the "Links" section for chest pain algorithms and additional  guidance. Performed at [facility], [address], [place], [place] [address]     Lab Results  Component Value Date   CREATININE 1.09 09/01/2022   CREATININE 1.00 08/31/2022   CREATININE 1.31 (H) 08/31/2022      Latest Ref Rng & Units 09/01/2022    1:31 AM 08/31/2022    9:22 PM 08/31/2022    8:20 PM  CMP  Glucose 70 - 99 mg/dL 295  621  308   BUN 8 - 23 mg/dL 31  33  35   Creatinine 0.61 - 1.24 mg/dL 6.57  8.46  9.62   Sodium 135 - 145 mmol/L 141  144  141   Potassium 3.5 - 5.1 mmol/L 3.2  3.3  3.3   Chloride 98 - 111 mmol/L 107  106  104   CO2 22 - 32 mmol/L 22   22   Calcium 8.9 - 10.3 mg/dL 8.8   9.6   Total Protein 6.5 - 8.1 g/dL 4.5   5.6   Total Bilirubin 0.3 - 1.2 mg/dL 1.3   1.5   Alkaline Phos 38 - 126 U/L 43   55   AST 15 - 41 U/L 93   104   ALT 0 - 44 U/L 43   51     Unresulted Labs (From admission, onward)     Start     Ordered   08/31/22 2004  Blood Culture (routine x 2)  (Undifferentiated presentation (screening labs and basic nursing orders))  BLOOD CULTURE X 2,   STAT      08/31/22 2004           Pt has received : Orders Placed This Encounter  Procedures   Critical Care    This order was created via  procedure documentation    Standing Status:   Standing    Number of Occurrences:   1   Cervical collar hard    Standing  Status:   Standing    Number of Occurrences:   1   Blood Culture (routine x 2)    Standing Status:   Standing    Number of Occurrences:   2   MRSA Next Gen by PCR, Nasal    Standing Status:   Standing    Number of Occurrences:   1   DG Chest Port 1 View    Standing Status:   Standing    Number of Occurrences:   1    Order Specific Question:   Reason for Exam (SYMPTOM  OR DIAGNOSIS REQUIRED)    Answer:   weakness, fall   DG Pelvis Portable    Standing Status:   Standing    Number of Occurrences:   1    Order Specific Question:   Reason for Exam (SYMPTOM  OR DIAGNOSIS REQUIRED)    Answer:   weakness, fall   CT Head Wo Contrast    Standing Status:   Standing    Number of Occurrences:   1   CT Cervical Spine Wo Contrast    Standing Status:   Standing    Number of Occurrences:   1   CT CHEST ABDOMEN PELVIS W CONTRAST    Standing Status:   Standing    Number of Occurrences:   1    Order Specific Question:   There is no creatinine result in the last 24 hours.  Consider ordering an iStat for this patient.    Answer:   Yes [1]    Order Specific Question:   Does the patient have a contrast media/X-ray dye allergy?    Answer:   No    Order Specific Question:   If indicated for the ordered procedure, I authorize the administration of oral contrast media per Radiology protocol    Answer:   Yes   Lactic acid, plasma    Standing Status:   Standing    Number of Occurrences:   2   Comprehensive metabolic panel    Standing Status:   Standing    Number of Occurrences:   1   CBC with Differential    Standing Status:   Standing    Number of Occurrences:   1   Protime-INR    Standing Status:   Standing    Number of Occurrences:   1   APTT    Standing Status:   Standing    Number of Occurrences:   1   .Cooxemetry Panel (carboxy, met, total hgb, O2 sat)(NOT AT [facility] or  [facility])    Standing Status:   Standing    Number of Occurrences:   1   Urinalysis, w/ Reflex to Culture (Infection Suspected) -Urine, Clean Catch    Standing Status:   Standing    Number of Occurrences:   1    Order Specific Question:   Specimen Source    Answer:   Urine, Clean Catch [[id number]]   CK    Standing Status:   Standing    Number of Occurrences:   1   Lactic acid, plasma    Standing Status:   Standing    Number of Occurrences:   2   Magnesium    Standing Status:   Standing    Number of Occurrences:   1   TSH    Standing Status:   Standing    Number of Occurrences:   1   Comprehensive metabolic panel    Standing Status:   Standing  Number of Occurrences:   1   CBC    Standing Status:   Standing    Number of Occurrences:   1   T4, free    Standing Status:   Standing    Number of Occurrences:   1   Diet NPO time specified    Standing Status:   Standing    Number of Occurrences:   1   Document height and weight    Standing Status:   Standing    Number of Occurrences:   1   Assess and Document Glasgow Coma Scale    Standing Status:   Standing    Number of Occurrences:   1   Document vital signs within 1-hour of fluid bolus completion.  Notify provider of abnormal vital signs despite fluid resuscitation.    Standing Status:   Standing    Number of Occurrences:   1   Refer to Sidebar Report: Sepsis Bundle ED/IP    Sepsis Bundle ED/IP    Standing Status:   Standing    Number of Occurrences:   1   Notify provider for difficulties obtaining IV access    Standing Status:   Standing    Number of Occurrences:   1   Initiate Carrier Fluid Protocol    Standing Status:   Standing    Number of Occurrences:   1   DO NOT delay antibiotics if unable to obtain blood culture.    Standing Status:   Standing    Number of Occurrences:   1   Pharmacy Tech to prioritize PTA Med Rec - [facility] ONLY    Standing Status:   Standing    Number of Occurrences:   1   Cardiac Monitoring  Continuous x 24 hours Indications for use: Other; other indications for use: Rhabdomyolysis    Standing Status:   Standing    Number of Occurrences:   1    Order Specific Question:   Indications for use:    Answer:   Other    Order Specific Question:   other indications for use:    Answer:   Rhabdomyolysis   Maintain IV access    Standing Status:   Standing    Number of Occurrences:   1   Vital signs    Standing Status:   Standing    Number of Occurrences:   1   Notify physician (specify)    Standing Status:   Standing    Number of Occurrences:   20    Order Specific Question:   Notify Physician    Answer:   for pulse less than 55 or greater than 120    Order Specific Question:   Notify Physician    Answer:   for respiratory rate less than 12 or greater than 25    Order Specific Question:   Notify Physician    Answer:   for temperature greater than 100.5 F    Order Specific Question:   Notify Physician    Answer:   for urinary output less than 30 mL/hr for four hours    Order Specific Question:   Notify Physician    Answer:   for systolic BP less than 90 or greater than 160, diastolic BP less than 60 or greater than 100    Order Specific Question:   Notify Physician    Answer:   for new hypoxia w/ oxygen saturations < 88%   Initiate Oral Care Protocol    Standing  Status:   Standing    Number of Occurrences:   1   Initiate Carrier Fluid Protocol    Standing Status:   Standing    Number of Occurrences:   1   RN may order General Admission PRN Orders utilizing "General Admission PRN medications" (through manage orders) for the following patient needs: allergy symptoms (Claritin), cold sores (Carmex), cough (Robitussin DM), eye irritation (Liquifilm Tears), hemorrhoids (Tucks), indigestion (Maalox), minor skin irritation (Hydrocortisone Cream), muscle pain Romeo Apple Gay), nose irritation (saline nasal spray) and sore throat (Chloraseptic spray).    Standing Status:   Standing    Number of  Occurrences:   [id number]   Neuro checks    Standing Status:   Standing    Number of Occurrences:   1   Intake and Output    Standing Status:   Standing    Number of Occurrences:   1   Daily weights    Standing Status:   Standing    Number of Occurrences:   1   Bladder scan    To check for urine retention, call if > .    Standing Status:   Standing    Number of Occurrences:   1   Check for fecal impaction; check bowel movement record; assess for pain.    Standing Status:   Standing    Number of Occurrences:   1   Place order for blood cultures x 2 (from different sites) for Temp > 101F    Standing Status:   Standing    Number of Occurrences:   1   Bed rest    Standing Status:   Standing    Number of Occurrences:   1   Insert urethral catheter    Standing Status:   Standing    Number of Occurrences:   1    Order Specific Question:   Catheter type    Answer:   foley    Order Specific Question:   Reason for Insertion    Answer:   Acute urinary retention (I&O cath for 24 hours prior to catheter insertion-Inpatient Only)    Order Specific Question:   Upon removal of foley, follow the Post Indwelling Urinary Catheter Retention Guidelines if patient meets criteria    Answer:   Yes   Swallow screen    Standing Status:   Standing    Number of Occurrences:   1   Strict intake and output    Standing Status:   Standing    Number of Occurrences:   1   Refer to Sidebar Report - CHG cloths Sidebar    - CHG cloths Sidebar    Standing Status:   Standing    Number of Occurrences:   1   Patient education (specify): - [facility]G Bathing    Standing Status:   Standing    Number of Occurrences:   1    Order Specific Question:   Specify    Answer:   - [facility]G [facility]   Full code    Standing Status:   Standing    Number of Occurrences:   1    Order Specific Question:   By:    Answer:   Other   Code Sepsis activation.  This occurs automatically when order is signed and  prioritizes pharmacy, lab, and radiology services for STAT collections and interventions.  If [facility] downtime, call [facility] [phone]) to activate Code Sepsis.    Standing Status:   Standing  Number of Occurrences:   1    Order Specific Question:   Initiate "Code Sepsis" tracking    Answer:   Yes    Order Specific Question:   Contact [facility] ([phone])    Answer:   No    Order Specific Question:   Reason for Consult?    Answer:   tracking   pharmacy consult    Standing Status:   Standing    Number of Occurrences:   1    Order Specific Question:   Reason for Consult (*indicate specifics in comment field)    Answer:   Other (see comment)    Order Specific Question:   Comment:    Answer:   Treatment for SEPSIS has been initiated. Follow patient to expedite timely antibiotic and fluid administration.   ceFEPime (MAXIPIME) per pharmacy consult             Standing Status:   Standing    Number of Occurrences:   1    Order Specific Question:   Antibiotic Indication:    Answer:   Other Indication (list below)    Order Specific Question:   Other Indication:    Answer:   Unknown source    Order Specific Question:   Duration of therapy in days:    Answer:   7   vancomycin per pharmacy consult    Standing Status:   Standing    Number of Occurrences:   1    Order Specific Question:   Indication:    Answer:   Other Indication (list below)    Order Specific Question:   Other Indication:    Answer:   Unknown source    Order Specific Question:   Duration of therapy in days:    Answer:   7   Consult to neurosurgery    Standing Status:   Standing    Number of Occurrences:   1    Order Specific Question:   Place call to:    Answer:   oncall Neurosurgery    Order Specific Question:   Reason for Consult    Answer:   Admit   Consult to hospitalist    Standing Status:   Standing    Number of Occurrences:   1    Order Specific Question:   Place call to:    Answer:   [facility] Question:   Reason for Consult    Answer:   Admit   Consult to wound, ostomy, continence    Standing Status:   Standing    Number of Occurrences:   1    Order Specific Question:   Reason for Consult?    Answer:   unstagable pressure ulcer sacrum   Inpatient consult to Cardiology Consult Timeframe: ROUTINE - requires response within 24 hours; Reason for Consult? a.fib rvr / syncope.    Standing Status:   Standing    Number of Occurrences:   1    Order Specific Question:   Consult Timeframe    Answer:   ROUTINE - requires response within 24 hours    Order Specific Question:   Reason for Consult?    Answer:   a.fib rvr / syncope.   Pulse oximetry check with vital signs    Standing Status:   Standing    Number of Occurrences:   1   Oxygen therapy Mode or (Route): Nasal cannula; Liters Per Minute: 2; Keep 02 saturation: greater than 92 %  Standing Status:   Standing    Number of Occurrences:   1    Order Specific Question:   Mode or (Route)    Answer:   Nasal cannula    Order Specific Question:   Liters Per Minute    Answer:   2    Order Specific Question:   Keep 02 saturation    Answer:   greater than 92 %   I-Stat Chem 8, ED    Standing Status:   Standing    Number of Occurrences:   1   EKG 12-Lead    Standing Status:   Standing    Number of Occurrences:   1   ED EKG 12-Lead    Standing Status:   Standing    Number of Occurrences:   1    Order Specific Question:   Notes    Answer:   Baseline   Insert peripheral IV X 1    Angiocath size 20G or larger    Standing Status:   Standing    Number of Occurrences:   1   Insert 2nd peripheral IV if not already present.    Standing Status:   Standing    Number of Occurrences:   20   Admit to Inpatient (patient's expected length of stay will be greater than 2 midnights or inpatient only procedure)    Standing Status:   Standing    Number of Occurrences:   1    Order Specific Question:   Hospital Area    Answer:    [facility] [[id number]]    Order Specific Question:   Level of Care    Answer:   Progressive [102]    Order Specific Question:   Admit to Progressive based on following criteria    Answer:   NEUROLOGICAL AND NEUROSURGICAL complex patients with significant risk of instability, who do not meet ICU criteria, yet require close observation or frequent assessment (< / = every 2 - 4 hours) with medical / nursing intervention.    Order Specific Question:   Admit to Progressive based on following criteria    Answer:   CARDIOVASCULAR & THORACIC of moderate stability with acute coronary syndrome symptoms/low risk myocardial infarction/hypertensive urgency/arrhythmias/heart failure potentially compromising stability and stable post cardiovascular intervention patients.    Order Specific Question:   Admit to Progressive based on following criteria    Answer:   MULTISYSTEM THREATS such as stable sepsis, metabolic/electrolyte imbalance with or without encephalopathy that is responding to early treatment.    Order Specific Question:   May admit patient to [facility] or [facility] if equivalent level of care is available:    Answer:   Yes    Order Specific Question:   Covid Evaluation    Answer:   Asymptomatic - no recent exposure (last 10 days) testing not required    Order Specific Question:   Diagnosis    Answer:   AMS (altered mental status) [[id number]]    Order Specific Question:   Admitting Physician    Answer:   Darrold Junker    Order Specific Question:   Attending Physician    Answer:   Darrold Junker    Order Specific Question:   Certification:    Answer:   I certify this patient will need inpatient services for at least 2 midnights    Order Specific Question:   Estimated Length of Stay    Answer:   5  Aspiration precautions    Standing Status:   Standing    Number of Occurrences:   1   Fall precautions    Standing Status:   Standing    Number of Occurrences:   1     Meds ordered this encounter  Medications   acetaminophen (TYLENOL) suppository 650 mg   ceFEPIme (MAXIPIME) 2 g in sodium chloride 0.9 % 100 mL IVPB    Order Specific Question:   Antibiotic Indication:    Answer:   Other Indication (list below)    Order Specific Question:   Other Indication:    Answer:   Unknown source   metroNIDAZOLE (FLAGYL) IVPB 500 mg    Order Specific Question:   Antibiotic Indication:    Answer:   Other Indication (list below)    Order Specific Question:   Other Indication:    Answer:   Unknown source   DISCONTD: vancomycin (VANCOCIN) IVPB 1000 mg/200 mL premix    Order Specific Question:   Indication:    Answer:   Other Indication (list below)    Order Specific Question:   Other Indication:    Answer:   Unknown source   vancomycin (VANCOREADY) IVPB 1500 mg/300 mL    Order Specific Question:   Indication:    Answer:   Other Indication (list below)    Order Specific Question:   Other Indication:    Answer:   Unknown source   ceFEPIme (MAXIPIME) 2 g in sodium chloride 0.9 % 100 mL IVPB    Order Specific Question:   Antibiotic Indication:    Answer:   Sepsis   vancomycin (VANCOREADY) IVPB 750 mg/150 mL    Order Specific Question:   Indication:    Answer:   Sepsis   iohexol (OMNIPAQUE) 350 MG/ML injection 75 mL   DISCONTD: aspirin chewable tablet 324 mg   lactated ringers infusion   lactated ringers bolus 500 mL   sodium chloride flush (NS) 0.9 % injection 3 mL   nicotine (NICODERM CQ - dosed in mg/24 hours) patch 14 mg   hydrALAZINE (APRESOLINE) injection 5 mg   metoprolol tartrate (LOPRESSOR) injection 2.5 mg    For A-fib RVR with heart rate of 110 and above.   Chlorhexidine Gluconate Cloth 2 % PADS 6 each   thiamine (VITAMIN B1) injection 100 mg    Admission Imaging : CT CHEST ABDOMEN PELVIS W CONTRAST  Result Date: 08/31/2022 CLINICAL DATA:  Sepsis EXAM: CT CHEST, ABDOMEN, AND PELVIS WITH CONTRAST TECHNIQUE: Multidetector CT imaging of the chest,  abdomen and pelvis was performed following the standard protocol during bolus administration of intravenous contrast. RADIATION DOSE REDUCTION: This exam was performed according to the departmental dose-optimization program which includes automated exposure control, adjustment of the mA and/or kV according to patient size and/or use of iterative reconstruction technique. CONTRAST:  75mL OMNIPAQUE IOHEXOL 350 MG/ML SOLN COMPARISON:  None Available. FINDINGS: CHEST: Cardiovascular: No aortic injury. The thoracic aorta is normal in caliber. The heart is normal in size. No significant pericardial effusion. Severe atherosclerotic plaque. The main pulmonary artery is normal in caliber. Four-vessel coronary calcifications status post coronary artery bypass graft. No central pulmonary embolus. Mediastinum/Nodes: No pneumomediastinum. No mediastinal hematoma. The esophagus is unremarkable. The thyroid is unremarkable. The central airways are patent. No mediastinal, hilar, or axillary lymphadenopathy. Lungs/Pleura: Emphysematous changes. No focal consolidation. No pulmonary nodule. No pulmonary mass. No pulmonary contusion or laceration. No pneumatocele formation. No pleural effusion. No pneumothorax. No hemothorax. Musculoskeletal/Chest wall: No chest  wall mass.  Trace bilateral gynecomastia. No acute rib or sternal fracture. Chronic T7, T8, and T9 spinous process fractures. Associated chronic appearing burst fracture of the T9 vertebral body. No retropulsion into the central canal. Approximately at least 40% vertebral body height loss centrally. ABDOMEN / PELVIS: Hepatobiliary: Not enlarged. Vague slightly nodular hypodensity along the falciform ligament also noted on delayed imaging suggestive of focal fatty infiltration. No laceration or subcapsular hematoma. Cholelithiasis.  No biliary ductal dilatation. Pancreas: Normal pancreatic contour. No main pancreatic duct dilatation. Spleen: Not enlarged. No focal lesion. No  laceration, subcapsular hematoma, or vascular injury. Adrenals/Urinary Tract: No nodularity bilaterally. Calcification associated with the kidneys likely vascular. No obstructive nephrolithiasis. Bilateral kidneys enhance symmetrically. No hydronephrosis. No contusion, laceration, or subcapsular hematoma. No injury to the vascular structures or collecting systems. No hydroureter. The urinary bladder is unremarkable. Stomach/Bowel: No small or large bowel wall thickening or dilatation. The appendix is not definitely identified with no inflammatory changes in the right lower quadrant to suggest acute appendicitis. Vasculature/Lymphatics: Severe atherosclerotic plaque. No abdominal aorta or iliac aneurysm. No active contrast extravasation or pseudoaneurysm. No abdominal, pelvic, inguinal lymphadenopathy. Reproductive: Normal. Other: No simple free fluid ascites. No pneumoperitoneum. No hemoperitoneum. No mesenteric hematoma identified. No organized fluid collection. Musculoskeletal: No significant soft tissue hematoma. No acute pelvic fracture.  No acute spinal fracture. Ports and Devices: None. IMPRESSION: 1. No acute intrathoracic, intra-abdominal, intrapelvic traumatic injury. 2. Chronic T9 burst fracture-correlate with point tenderness to palpation to evaluate for an acute component. Otherwise no definite acute fracture or traumatic malalignment of the thoracic or lumbar spine. 3. Other imaging findings of potential clinical significance: Cholelithiasis with no acute cholecystitis. Stool throughout the colon-correlate for constipation. Aortic Atherosclerosis (ICD10-I70.0) and Emphysema (ICD10-J43.9). Electronically Signed   By: Tish Frederickson M.D.   On: 08/31/2022 22:57   CT Head Wo Contrast  Result Date: 08/31/2022 CLINICAL DATA:  Neck trauma (Age >= 65y); Mental status change, unknown cause EXAM: CT HEAD WITHOUT CONTRAST CT CERVICAL SPINE WITHOUT CONTRAST TECHNIQUE: Multidetector CT imaging of the head and  cervical spine was performed following the standard protocol without intravenous contrast. Multiplanar CT image reconstructions of the cervical spine were also generated. RADIATION DOSE REDUCTION: This exam was performed according to the departmental dose-optimization program which includes automated exposure control, adjustment of the mA and/or kV according to patient size and/or use of iterative reconstruction technique. COMPARISON:  CT head and C-spine 10/23/2021 FINDINGS: CT HEAD FINDINGS Brain: Cerebral ventricle sizes are concordant with the degree of cerebral volume loss. Trace patchy and confluent areas of decreased attenuation are noted throughout the deep and periventricular white matter of the cerebral hemispheres bilaterally, compatible with chronic microvascular ischemic disease. No evidence of large-territorial acute infarction. No parenchymal hemorrhage. No mass lesion. No extra-axial collection. No mass effect or midline shift. No hydrocephalus. Basilar cisterns are patent. Vascular: No hyperdense vessel. Skull: No acute fracture or focal lesion. Sinuses/Orbits: Paranasal sinuses and mastoid air cells are clear. The orbits are unremarkable. Other: 6 mm left frontal scalp hematoma. 13 mm right parieto-occipital scalp hematoma formation. CT CERVICAL SPINE FINDINGS Alignment: Normal. Skull base and vertebrae: Chronic C2 dens fracture with increased separation of the fracture fragments now up to 3 mm (from less than 1 mm). No aggressive appearing focal osseous lesion or focal pathologic process. Soft tissues and spinal canal: No prevertebral fluid or swelling. No visible canal hematoma. Upper chest: Emphysematous changes. Other: Atherosclerotic plaque of the carotid arteries within the neck. IMPRESSION: 1.  No acute intracranial abnormality. 2. Chronic C2 dens fracture with increased separation of the fracture fragments up to 3 mm (from less than 1 mm). Findings may be acute versus chronic. If clinically  indicated, consider MRI cervical spine for further evaluation. 3. Otherwise no acute displaced fracture or traumatic listhesis of the cervical spine. 4. A 6 mm left frontal scalp hematoma. 5. A 13 mm right parieto-occipital scalp hematoma formation. 6. Emphysema (ICD10-J43.9). Electronically Signed   By: Tish Frederickson M.D.   On: 08/31/2022 22:37   CT Cervical Spine Wo Contrast  Result Date: 08/31/2022 CLINICAL DATA:  Neck trauma (Age >= 65y); Mental status change, unknown cause EXAM: CT HEAD WITHOUT CONTRAST CT CERVICAL SPINE WITHOUT CONTRAST TECHNIQUE: Multidetector CT imaging of the head and cervical spine was performed following the standard protocol without intravenous contrast. Multiplanar CT image reconstructions of the cervical spine were also generated. RADIATION DOSE REDUCTION: This exam was performed according to the departmental dose-optimization program which includes automated exposure control, adjustment of the mA and/or kV according to patient size and/or use of iterative reconstruction technique. COMPARISON:  CT head and C-spine 10/23/2021 FINDINGS: CT HEAD FINDINGS Brain: Cerebral ventricle sizes are concordant with the degree of cerebral volume loss. Trace patchy and confluent areas of decreased attenuation are noted throughout the deep and periventricular white matter of the cerebral hemispheres bilaterally, compatible with chronic microvascular ischemic disease. No evidence of large-territorial acute infarction. No parenchymal hemorrhage. No mass lesion. No extra-axial collection. No mass effect or midline shift. No hydrocephalus. Basilar cisterns are patent. Vascular: No hyperdense vessel. Skull: No acute fracture or focal lesion. Sinuses/Orbits: Paranasal sinuses and mastoid air cells are clear. The orbits are unremarkable. Other: 6 mm left frontal scalp hematoma. 13 mm right parieto-occipital scalp hematoma formation. CT CERVICAL SPINE FINDINGS Alignment: Normal. Skull base and  vertebrae: Chronic C2 dens fracture with increased separation of the fracture fragments now up to 3 mm (from less than 1 mm). No aggressive appearing focal osseous lesion or focal pathologic process. Soft tissues and spinal canal: No prevertebral fluid or swelling. No visible canal hematoma. Upper chest: Emphysematous changes. Other: Atherosclerotic plaque of the carotid arteries within the neck. IMPRESSION: 1.  No acute intracranial abnormality. 2. Chronic C2 dens fracture with increased separation of the fracture fragments up to 3 mm (from less than 1 mm). Findings may be acute versus chronic. If clinically indicated, consider MRI cervical spine for further evaluation. 3. Otherwise no acute displaced fracture or traumatic listhesis of the cervical spine. 4. A 6 mm left frontal scalp hematoma. 5. A 13 mm right parieto-occipital scalp hematoma formation. 6. Emphysema (ICD10-J43.9). Electronically Signed   By: Tish Frederickson M.D.   On: 08/31/2022 22:37   DG Pelvis Portable  Result Date: 08/31/2022 CLINICAL DATA:  Fall, weakness. EXAM: PORTABLE PELVIS 1-2 VIEWS COMPARISON:  None Available. FINDINGS: The cortical margins of the bony pelvis are intact. No fracture. Pubic symphysis and sacroiliac joints are congruent. Both femoral heads are well-seated in the respective acetabula. External artifact projects over the lateral left hip. Extensive arterial vascular calcifications. IMPRESSION: No pelvic fracture. Extensive arterial vascular calcifications. Electronically Signed   By: Narda Rutherford M.D.   On: 08/31/2022 20:38   DG Chest Port 1 View  Result Date: 08/31/2022 CLINICAL DATA:  Weakness nose, fall. EXAM: PORTABLE CHEST 1 VIEW COMPARISON:  Radiograph 06/08/2015 FINDINGS: Prior median sternotomy and CABG. Stable heart size allowing for lower lung volumes. Unchanged mediastinal contours. Aortic atherosclerosis. Patchy airspace opacity at the  retrocardiac left lung base. No pneumothorax or large pleural  effusion. On limited assessment, no acute osseous abnormalities are seen. IMPRESSION: 1. Patchy airspace opacity at the retrocardiac left lung base. This may represent pneumonia, although possibility of pulmonary contusion is considered in the setting of fall. 2. Prior CABG. Electronically Signed   By: Narda Rutherford M.D.   On: 08/31/2022 20:37    Physical Examination: Vitals:   09/01/22 0300 09/01/22 0400 09/01/22 0500 09/01/22 0600  BP: 133/67 132/74 (!) 145/82 (!) 141/72  Pulse: (!) 102 (!) 106 98 (!) 105  Temp:      Resp: 15 14 (!) 21 20  Height:      Weight:      SpO2: 92% 94% 95% 96%  TempSrc:      BMI (Calculated):       Physical Exam Vitals and nursing note reviewed.  Constitutional:      General: He is not in acute distress.    Appearance: He is not ill-appearing.  HENT:     Head: Normocephalic and atraumatic.     Right Ear: Hearing and external ear normal.     Left Ear: Hearing and external ear normal.     Nose: Nose normal. No nasal deformity.     Mouth/Throat:     Lips: Pink.     Tongue: No lesions.     Pharynx: Oropharynx is clear.  Eyes:     General: Lids are normal.     Extraocular Movements: Extraocular movements intact.     Pupils: Pupils are equal, round, and reactive to light.  Cardiovascular:     Rate and Rhythm: Tachycardia present. Rhythm irregular.     Heart sounds: Normal heart sounds.  Pulmonary:     Effort: Pulmonary effort is normal.     Breath sounds: Normal breath sounds.  Abdominal:     General: Bowel sounds are normal. There is no distension.     Palpations: Abdomen is soft. There is no mass.     Tenderness: There is no abdominal tenderness.  Musculoskeletal:     Right lower leg: No edema.     Left lower leg: No edema.  Skin:    General: Skin is warm.  Neurological:     General: No focal deficit present.     Cranial Nerves: No cranial nerve deficit.     Motor: Weakness present.  Psychiatric:        Attention and Perception:  Attention normal.        Speech: Speech normal.        Behavior: Behavior is cooperative.     Assessment and Plan: We will admit to stepdown unit in case pt needs diltiazem drip .  AMS: Pt found on floor since past few days.  Suspect  metabolic encephalopathy . Unclear as to the initial event that caused this or led to it.  Currently wakes up to when called  but somnolent Initial head ct shows: IMPRESSION: 1.  No acute intracranial abnormality. 2. Chronic C2 dens fracture with increased separation of the fracture fragments up to 3 mm (from less than 1 mm). Findings may be acute versus chronic. If clinically indicated, consider MRI cervical spine for further evaluation. 3. Otherwise no acute displaced fracture or traumatic listhesis of the cervical spine. 4. A 6 mm left frontal scalp hematoma. 5. A 13 mm right parieto-occipital scalp hematoma formation. 6. Emphysema (ICD10-J43.9). Neuro checks.  MR in AM if still confused.  Neuro consult as deemed needed  CT chest abd/pelvis: Shows  1. No acute intrathoracic, intra-abdominal, intrapelvic traumatic injury. 2. Chronic T9 burst fracture-correlate with point tenderness to palpation to evaluate for an acute component. Otherwise no definite acute fracture or traumatic malalignment of the thoracic or lumbar spine. 3. Other imaging findings of potential clinical significance: Cholelithiasis with no acute cholecystitis. Stool throughout the colon-correlate for constipation. Aortic Atherosclerosis (ICD10-I70.0) and Emphysema (ICD10-J43.9).    ICM-EF 45-50% by echo 04/30/14 No recent echocardiograms. We will obtain echo with bubble study.     AKI (acute kidney injury) (HCC) Lab Results  Component Value Date   CREATININE 1.00 08/31/2022   CREATININE 1.31 (H) 08/31/2022   CREATININE 1.08 04/10/2022  Monitor and avoid contrast unless absolutely needed .   Chronic atrial fibrillation with RVR (HCC) Patient in A-fib  RVR. Chronically has a history of A-fib and is managed with Eliquis. Patient on metoprolol 50 mg daily at home. Currently patient is n.p.o. until swallow eval ration.  EKG shown below.     Non-traumatic rhabdomyolysis Cpk of 4821. 2/2 fall and immobility. MIVF. LR bolus 500 cc give and continued on 100 cc / hour   Odontoid fracture [facility]) Patient has a history of C2 fracture however imaging today shows increase in displacement currently patient in a c-collar and neurosurgery illness going to see patient tomorrow morning. IMPRESSION: 1.  No acute intracranial abnormality. 2. Chronic C2 dens fracture with increased separation of the fracture fragments up to 3 mm (from less than 1 mm). Findings may be acute versus chronic. If clinically indicated, consider MRI cervical spine for further evaluation. 3. Otherwise no acute displaced fracture or traumatic listhesis of the cervical spine. 4. A 6 mm left frontal scalp hematoma. 5. A 13 mm right parieto-occipital scalp hematoma formation. 6. Emphysema (ICD10-J43.9). We will obtain an MRI of the C-spine without contrast.  Nicotine dependence Nicotine patch, counseling once patient is coherent.  COPD (chronic obstructive pulmonary disease) (HCC) Compensated, stable. SpO2: 95 % Nicotine patch. PRN Albuterol.   PVD (peripheral vascular disease) with claudication (HCC) Continue patient on Plavix Lipitor and Eliquis once patient passes swallow eval.  CAD S/P LM and CFX PCI 05/04/14/ Elevated TNI: Patient has a history of heart disease, will continue patient on Plavix and Lipitor and Eliquis, again once patient passes swallow evaluation. Will hold aspirin 81.  Once med reconciliation is available we will decide if patient needs triple therapy with aspirin Plavix and Eliquis. Patient has troponins of 208 and repeat of 210 we will repeat a third set however suspect secondary to sepsis and/or demand ischemia.   History of transient ischemic  attack (TIA) Patient has a history of TIA, currently exam is nonfocal. Patient is awake alert cooperative but confused suspect metabolic encephalopathy and also secondary to sepsis.   Sepsis secondary to UTI (HCC) Continue with  cefepime and vancomycin for sepsis protocol. Follow C/S.    Other orders -     EKG 12-Lead; Standing -     Diet NPO time specified; Standing -     Height and weight; Standing -     Assess and Document Glasgow Coma Scale; Standing -     Document vital signs within 1-hour of fluid bolus completion and notify provider of bolus completion; Standing -     Refer to Sidebar Report:; Standing -     Insert peripheral IV; Standing -     Notify provider for difficulties obtaining IV access; Standing -     Initiate Carrier Fluid Protocol; Standing -  Lactic acid, plasma; Standing -     Comprehensive metabolic panel; Standing -     CBC with Differential/Platelet; Standing -     Protime-INR; Standing -     APTT; Standing -     Culture, blood (Routine X 2) w Reflex to ID Panel; Standing -     DG Chest Port 1 View; Standing -     EKG 12-Lead; Standing -     Cooxemetry Panel (carboxy, met, total hgb, O2 sat); Standing -     Urinalysis, w/ Reflex to Culture (Infection Suspected); Standing -     Troponin I (High Sensitivity); Standing -     I-stat chem 8, ed; Standing -     CK; Standing -     DG Pelvis Portable; Standing -     CT HEAD WO CONTRAST ( ); Standing -     CT CERVICAL SPINE WO CONTRAST; Standing -     CT CHEST ABDOMEN PELVIS W CONTRAST; Standing -     Acetaminophen -     DO NOT delay antibiotics if unable to obtain blood culture.; Standing -     Insert peripheral IV; Standing -     Code Sepsis activation.  This occurs automatically when order is signed and prioritizes pharmacy, lab, and radiology services for STAT collections and interventions.  If [facility] downtime, call [facility] [phone]) to activate Code Sepsis.; Standing -     pharmacy consult;  Standing -     ceFEPIme (MAXIPIME) 2 g in sodium chloride 0.9 % 100 mL IVPB -     CeFEPIme (MAXIPIME) per pharmacy consult         ; Standing -     metroNIDAZOLE -     vancomycin per pharmacy consult; Standing -     Vancomycin HCl -     ceFEPIme (MAXIPIME) 2 g in sodium chloride 0.9 % 100 mL IVPB -     Vancomycin HCl -     MRSA Next Gen by PCR, Nasal; Standing -     Iohexol -     Cervical collar hard; Standing -     Consult to neurosurgery; Standing -     Consult to hospitalist; Standing -     Aspirin -     Lactated Ringers -     Pharmacy Tech to prioritize PTA Med Rec; Standing -     Lactic acid, plasma; Standing -     Critical Care; Standing -     lactated ringers -     Magnesium; Standing    DVT prophylaxis:  Eliquis.    Code Status:  Full code      08/31/2022    8:10 PM  Advanced Directives  Does Patient Have a Medical Advance Directive? Unable to assess, patient is non-responsive or altered mental status   Family Communication:  None. Emergency Contact: Contact Information     Name Relation Home Work Mobile   [place] Son [phone]  [phone]   Keener,Christopher Son [phone]     MINGO, HOILMAN   [phone]      Disposition Plan:  TBD.  Consults: Neurosurgery per EDMD. Admission status: Inpatient.   Unit / Expected LOS: Progressive / stepdown.  Gertha Calkin MD [facility]  6 PM- 2 AM. [phone] Please use [email or web address] OR call [facility] Admits & Consults @ [phone] to contact current Assigned [facility] Attending/Consulting MD for this patient.

## 2022-09-01 NOTE — Assessment & Plan Note (Signed)
Attribute to sepsis/ ? If had any gallstones.  We will get USG of gall bladder.  USG of GB.

## 2022-09-01 NOTE — Progress Notes (Signed)
   09/01/22 0230  Assess: MEWS Score  Temp 98.1 F (36.7 C)  BP (!) 140/95  MAP (mmHg) 105  Pulse Rate (!) 120  ECG Heart Rate (!) 120  Resp 18  Level of Consciousness Alert  SpO2 95 %  O2 Device Room Air  Assess: MEWS Score  MEWS Temp 0  MEWS Systolic 0  MEWS Pulse 2  MEWS RR 0  MEWS LOC 0  MEWS Score 2  MEWS Score Color Yellow  Assess: if the MEWS score is Yellow or Red  Were vital signs taken at a resting state? Yes  Focused Assessment No change from prior assessment  Does the patient meet 2 or more of the SIRS criteria? Yes  Does the patient have a confirmed or suspected source of infection? Yes  MEWS guidelines implemented  Yes, yellow  Treat  MEWS Interventions Considered administering scheduled or prn medications/treatments as ordered  Take Vital Signs  Increase Vital Sign Frequency  Yellow: Q2hr x1, continue Q4hrs until patient remains green for 12hrs  Escalate  MEWS: Escalate Yellow: Discuss with charge nurse and consider notifying provider and/or RRT  Notify: Charge Nurse/RN  Name of Charge Nurse/RN Notified [facility]  Provider Notification  Provider Name/Title Dr Allena Katz (notified in ED)  Date Provider Notified 09/01/22  Method of Notification Rounds (ED)  Provider response Other (Comment) (admit orders)  Date of Provider Response 09/01/22  Time of Provider Response 0000  Assess: SIRS CRITERIA  SIRS Temperature  0  SIRS Pulse 1  SIRS Respirations  0  SIRS WBC 0  SIRS Score Sum  1

## 2022-09-02 ENCOUNTER — Inpatient Hospital Stay (HOSPITAL_COMMUNITY): Payer: Medicare Other

## 2022-09-02 DIAGNOSIS — I509 Heart failure, unspecified: Secondary | ICD-10-CM | POA: Diagnosis not present

## 2022-09-02 DIAGNOSIS — R400 Somnolence: Secondary | ICD-10-CM | POA: Diagnosis not present

## 2022-09-02 DIAGNOSIS — I480 Paroxysmal atrial fibrillation: Secondary | ICD-10-CM | POA: Diagnosis present

## 2022-09-02 LAB — BASIC METABOLIC PANEL
Anion gap: 11 (ref 5–15)
BUN: 27 mg/dL — ABNORMAL HIGH (ref 8–23)
CO2: 24 mmol/L (ref 22–32)
Calcium: 8.8 mg/dL — ABNORMAL LOW (ref 8.9–10.3)
Chloride: 107 mmol/L (ref 98–111)
Creatinine, Ser: 0.75 mg/dL (ref 0.61–1.24)
GFR, Estimated: >60 mL/min (ref >60)
Glucose, Bld: 101 mg/dL — ABNORMAL HIGH (ref 70–99)
Potassium: 3.1 mmol/L — ABNORMAL LOW (ref 3.5–5.1)
Sodium: 142 mmol/L (ref 135–145)

## 2022-09-02 LAB — CBC
HCT: 36.1 % — ABNORMAL LOW (ref 39.0–52.0)
Hemoglobin: 12.4 g/dL — ABNORMAL LOW (ref 13.0–17.0)
MCH: 33.7 pg (ref 26.0–34.0)
MCHC: 34.3 g/dL (ref 30.0–36.0)
MCV: 98.1 fL (ref 80.0–100.0)
Platelets: 239 10*3/uL (ref 150–400)
RBC: 3.68 MIL/uL — ABNORMAL LOW (ref 4.22–5.81)
RDW: 12.6 % (ref 11.5–15.5)
WBC: 10.6 10*3/uL — ABNORMAL HIGH (ref 4.0–10.5)
nRBC: 0 % (ref 0.0–0.2)

## 2022-09-02 LAB — URINE CULTURE: Culture: NO GROWTH

## 2022-09-02 LAB — CK: Total CK: 1956 U/L — ABNORMAL HIGH (ref 49–397)

## 2022-09-02 LAB — ECHOCARDIOGRAM COMPLETE
AR max vel: 1.48 cm2
AV Area VTI: 1.68 cm2
AV Area mean vel: 1.73 cm2
AV Mean grad: 3 mmHg
AV Peak grad: 6 mmHg
Ao pk vel: 1.22 m/s
Area-P 1/2: 4.46 cm2
Height: 72 in
S' Lateral: 4.2 cm
Weight: 2592.61 oz

## 2022-09-02 LAB — MAGNESIUM
Magnesium: 2.1 mg/dL (ref 1.7–2.4)
Magnesium: 2.2 mg/dL (ref 1.7–2.4)

## 2022-09-02 LAB — APTT
aPTT: 60 seconds — ABNORMAL HIGH (ref 24–36)
aPTT: 72 seconds — ABNORMAL HIGH (ref 24–36)

## 2022-09-02 LAB — HEPARIN LEVEL (UNFRACTIONATED): Heparin Unfractionated: 1.03 IU/mL — ABNORMAL HIGH (ref 0.30–0.70)

## 2022-09-02 MED ORDER — DICLOFENAC EPOLAMINE 1.3 % EX PTCH
1.0000 | MEDICATED_PATCH | Freq: Two times a day (BID) | CUTANEOUS | Status: DC
  Administered 2022-09-02 – 2022-09-10 (×16): 1 via TRANSDERMAL
  Filled 2022-09-02 (×18): qty 1

## 2022-09-02 MED ORDER — POTASSIUM CHLORIDE IN NACL 40-0.9 MEQ/L-% IV SOLN
INTRAVENOUS | Status: DC
  Filled 2022-09-02 (×5): qty 1000

## 2022-09-02 MED ORDER — TRAMADOL HCL 50 MG PO TABS
50.0000 mg | ORAL_TABLET | Freq: Once | ORAL | Status: AC
  Administered 2022-09-02: 50 mg via ORAL
  Filled 2022-09-02: qty 1

## 2022-09-02 MED ORDER — METOPROLOL SUCCINATE ER 100 MG PO TB24
100.0000 mg | ORAL_TABLET | Freq: Every day | ORAL | Status: DC
  Administered 2022-09-02: 100 mg via ORAL
  Filled 2022-09-02: qty 1

## 2022-09-02 MED ORDER — POTASSIUM CHLORIDE CRYS ER 20 MEQ PO TBCR
40.0000 meq | EXTENDED_RELEASE_TABLET | Freq: Once | ORAL | Status: AC
  Administered 2022-09-02: 40 meq via ORAL
  Filled 2022-09-02: qty 2

## 2022-09-02 NOTE — Evaluation (Signed)
Occupational Therapy Evaluation Patient Details Name: [name] MRN: [id number] DOB: 17-Aug-1940 Today's Date: 09/02/2022   History of Present Illness Patient is an 82 y/o male admitted 08/31/22 after found on floor lethargic, confused covered in feces and foul smelling urine with c-collar in place HR 120 a-fib and concern for severe sepsis.  PMH positive for macular degeneration, chronic lumbago, PVD w/ stent L SFA, R SFA. A-flutter, ICM EF 45-50% CABG 1993 & re-do 2016, h/o fall on stairs with 1mm dens fracture non-operable.   Clinical Impression   Pt was ambulating with a cane and the caregiver of his wife. He was independent in ADLs, IADLs and driving prior to admission. Pt presents with back pain, generalized weakness, decreased standing balance and impaired cognition. He is cooperative with assessment. Pt requires +2 mod assist for bed mobility and mod assist assist with second person for safety to transfer with RW to chair. Pt needs set up to total assist for ADLs. Patient will benefit from continued inpatient follow up therapy, <3 hours/day.      Recommendations for follow up therapy are one component of a multi-disciplinary discharge planning process, led by the attending physician.  Recommendations may be updated based on patient status, additional functional criteria and insurance authorization.   Assistance Recommended at Discharge Frequent or constant Supervision/Assistance  Patient can return home with the following A lot of help with walking and/or transfers;A lot of help with bathing/dressing/bathroom;Assistance with cooking/housework;Direct supervision/assist for medications management;Direct supervision/assist for financial management;Assist for transportation;Help with stairs or ramp for entrance    Functional Status Assessment  Patient has had a recent decline in their functional status and demonstrates the ability to make significant improvements in function in a  reasonable and predictable amount of time.  Equipment Recommendations  Other (comment) (defer to next venue)    Recommendations for Other Services       Precautions / Restrictions Precautions Precautions: Fall;Cervical Precaution Booklet Issued: No Required Braces or Orthoses: Cervical Brace Cervical Brace: Hard collar;At all times Restrictions Weight Bearing Restrictions: No      Mobility Bed Mobility Overal bed mobility: Needs Assistance Bed Mobility: Supine to Sit     Supine to sit: HOB elevated, +2 for physical assistance, Mod assist     General bed mobility comments: assist to initiate scooting hips and lifting trunk due to pain needing +2 A    Transfers Overall transfer level: Needs assistance Equipment used: Rolling walker (2 wheels) Transfers: Sit to/from Stand, Bed to chair/wheelchair/BSC Sit to Stand: Min assist, +2 safety/equipment, From elevated surface     Step pivot transfers: Mod assist, +2 safety/equipment     General transfer comment: assist from EOB to stand with slightly increased height of bed for balance, then stepping to recliner with RW pt with LOB needing mod A to correct and cues for safety with stand to sit, +2 for equipment      Balance Overall balance assessment: Needs assistance   Sitting balance-Leahy Scale: Fair     Standing balance support: Bilateral upper extremity supported Standing balance-Leahy Scale: Poor                             ADL either performed or assessed with clinical judgement   ADL Overall ADL's : Needs assistance/impaired Eating/Feeding: Set up;Sitting   Grooming: Sitting;Wash/dry hands;Wash/dry face;Oral care;Supervision/safety   Upper Body Bathing: Moderate assistance;Sitting   Lower Body Bathing: Total assistance;Sit to/from stand   Upper  Body Dressing : Minimal assistance;Sitting   Lower Body Dressing: Total assistance;Bed level   Toilet Transfer: Stand-pivot;Rolling walker (2  wheels);Moderate assistance;+2 for safety/equipment Toilet Transfer Details (indicate cue type and reason): simulated to chair Toileting- Clothing Manipulation and Hygiene: Total assistance;Sit to/from stand;+2 for safety/equipment               Vision Baseline Vision/History: 1 Wears glasses Patient Visual Report: No change from baseline Additional Comments: unsure where glasses are     Perception     Praxis      Pertinent Vitals/Pain Pain Assessment Pain Assessment: Faces Faces Pain Scale: Hurts even more Pain Location: mid back Pain Descriptors / Indicators: Aching, Discomfort, Grimacing, Guarding Pain Intervention(s): RN gave pain meds during session, Monitored during session     Hand Dominance Right   Extremity/Trunk Assessment Upper Extremity Assessment Upper Extremity Assessment: Generalized weakness   Lower Extremity Assessment Lower Extremity Assessment: Defer to PT evaluation RLE Deficits / Details: moves antigravity, denies numbness in feet, but reports pain in feet and generalized, strength not formally tested, but stands with min A from EOB LLE Deficits / Details: moves antigravity, denies numbness in feet, but reports pain in feet and generalized, strength not formally tested, but stands with min A from EOB   Cervical / Trunk Assessment Cervical / Trunk Assessment: Kyphotic;Other exceptions Cervical / Trunk Exceptions: cervical fracture   Communication Communication Communication: HOH   Cognition Arousal/Alertness: Awake/alert Behavior During Therapy: WFL for tasks assessed/performed Overall Cognitive Status: Impaired/Different from baseline Area of Impairment: Orientation, Safety/judgement, Following commands, Problem solving                 Orientation Level: Time (knows he fell)     Following Commands: Follows one step commands inconsistently Safety/Judgement: Decreased awareness of safety, Decreased awareness of deficits (requiring  mitts)   Problem Solving: Slow processing       General Comments  on RA at rest SpO2 94%; HR maintanted in 90's, low 100s, BP Stable; couging up brown sputum during session    Exercises     Shoulder Instructions      Home Living Family/patient expects to be discharged to:: Private residence Living Arrangements: Spouse/significant other Available Help at Discharge: Other (Comment) (none) Type of Home: House Home Access: Level entry     Home Layout: Two level;Able to live on main level with bedroom/bathroom     Bathroom Shower/Tub: Producer, television/film/video: Standard     Home Equipment: Cane - single point;Shower seat;Grab bars - tub/shower;Hand held Programmer, systems (2 wheels)          Prior Functioning/Environment Prior Level of Function : Independent/Modified Independent;Driving;Other (comment) (caregiver of his wife)             Mobility Comments: walks with a cane ADLs Comments: managed meds and finances        OT Problem List: Decreased strength;Decreased activity tolerance;Impaired balance (sitting and/or standing);Decreased cognition;Decreased safety awareness;Decreased knowledge of use of DME or AE;Pain      OT Treatment/Interventions: Self-care/ADL training;DME and/or AE instruction;Therapeutic activities;Patient/family education;Balance training;Cognitive remediation/compensation    OT Goals(Current goals can be found in the care plan section) Acute Rehab OT Goals OT Goal Formulation: With patient Time For Goal Achievement: 09/16/22 Potential to Achieve Goals: Good ADL Goals Pt Will Perform Grooming: with min guard assist;standing Pt Will Perform Upper Body Dressing: with supervision;sitting Pt Will Perform Lower Body Dressing: with min assist;sit to/from stand Pt Will Transfer to Toilet: with  min guard assist;ambulating;bedside commode Pt Will Perform Toileting - Clothing Manipulation and hygiene: with min assist;sit to/from  stand Additional ADL Goal #1: Pt will perform bed mobility with min assist in preparation for ADLs. Additional ADL Goal #2: Pt will participate in formal cognitive screening.  OT Frequency: Min 2X/week    Co-evaluation PT/OT/SLP Co-Evaluation/Treatment: Yes Reason for Co-Treatment: For patient/therapist safety PT goals addressed during session: Mobility/safety with mobility;Balance OT goals addressed during session: ADL's and self-care      AM-PAC OT "6 Clicks" Daily Activity     Outcome Measure Help from another person eating meals?: A Little Help from another person taking care of personal grooming?: A Little Help from another person toileting, which includes using toliet, bedpan, or urinal?: Total Help from another person bathing (including washing, rinsing, drying)?: A Lot Help from another person to put on and taking off regular upper body clothing?: A Little Help from another person to put on and taking off regular lower body clothing?: Total 6 Click Score: 13   End of Session Equipment Utilized During Treatment: Gait belt;Rolling walker (2 wheels) Nurse Communication: Mobility status  Activity Tolerance: Patient tolerated treatment well Patient left: in chair;with call bell/phone within reach;with chair alarm set;with restraints reapplied  OT Visit Diagnosis: Unsteadiness on feet (R26.81);Other abnormalities of gait and mobility (R26.89);Pain;Muscle weakness (generalized) (M62.81);Other symptoms and signs involving cognitive function                Time: 1610-9604 OT Time Calculation (min): 27 min Charges:  OT General Charges $OT Visit: 1 Visit OT Evaluation $OT Eval Moderate Complexity: 1 Mod  Berna Spare, OTR/L Acute Rehabilitation Services Office: [phone]  Evern Bio 09/02/2022, 12:21 PM

## 2022-09-02 NOTE — Progress Notes (Signed)
Subjective:  Denies SSCP, palpitations or Dyspnea Very sore all over especially neck More alert and conversant   Objective:  Vitals:   09/02/22 0003 09/02/22 0319 09/02/22 0425 09/02/22 0736  BP: 131/76 (!) 148/78  (!) 136/91  Pulse: (!) 103 91  (!) 114  Resp: 20 20  20   Temp: 97.6 F (36.4 C) 97.6 F (36.4 C)  (!) 97.5 F (36.4 C)  TempSrc: Oral Oral  Axillary  SpO2: 95% 96%  94%  Weight:   73.5 kg   Height:        Intake/Output from previous day:  Intake/Output Summary (Last 24 hours) at 09/02/2022 0953 Last data filed at 09/02/2022 0529 Gross per 24 hour  Intake 1671.98 ml  Output 1250 ml  Net 421.98 ml    Physical Exam: Chronically ill male Poor dentition Cervical collar on  No murmur  Abdomen benign No edema Sacral decubitus  Lab Results: Basic Metabolic Panel: Recent Labs    09/01/22 0131 09/02/22 0034 09/02/22 0743  NA 141 142  --   K 3.2* 3.1*  --   CL 107 107  --   CO2 22 24  --   GLUCOSE 142* 101*  --   BUN 31* 27*  --   CREATININE 1.09 0.75  --   CALCIUM 8.8* 8.8*  --   MG 1.9  --  2.1   Liver Function Tests: Recent Labs    08/31/22 2020 09/01/22 0131  AST 104* 93*  ALT 51* 43  ALKPHOS 55 43  BILITOT 1.5* 1.3*  PROT 5.6* 4.5*  ALBUMIN 3.0* 2.4*   No results for input(s): "LIPASE", "AMYLASE" in the last 72 hours. CBC: Recent Labs    08/31/22 2020 08/31/22 2122 09/01/22 0131 09/02/22 0034  WBC 9.2  --  10.1 10.6*  NEUTROABS 8.0*  --   --   --   HGB 14.0   < > 11.8* 12.4*  HCT 40.0   < > 35.0* 36.1*  MCV 96.9  --  97.8 98.1  PLT 321  --  246 239   < > = values in this interval not displayed.   Cardiac Enzymes: Recent Labs    08/31/22 2020 09/02/22 0034  CKTOTAL 4,821* 1,956*   Thyroid Function Tests: Recent Labs    09/01/22 0131  TSH 0.445    Imaging: CT HIP LEFT WO CONTRAST  Result Date: 09/01/2022 CLINICAL DATA:  Left hip pain after fall. EXAM: CT OF THE LEFT HIP WITHOUT CONTRAST TECHNIQUE:  Multidetector CT imaging of the left hip was performed according to the standard protocol. Multiplanar CT image reconstructions were also generated. RADIATION DOSE REDUCTION: This exam was performed according to the departmental dose-optimization program which includes automated exposure control, adjustment of the mA and/or kV according to patient size and/or use of iterative reconstruction technique. COMPARISON:  CT abdomen pelvis from yesterday. FINDINGS: Bones/Joint/Cartilage No fracture or dislocation. Mild left hip osteoarthritis. Trace left hip joint effusion. Ligaments Ligaments are suboptimally evaluated by CT. Muscles and Tendons Grossly intact. Soft tissue No fluid collection or hematoma. No soft tissue mass. Bladder decompressed by Foley catheter. IMPRESSION: 1. No acute osseous abnormality. 2. Mild left hip osteoarthritis. Electronically Signed   By: Obie Dredge M.D.   On: 09/01/2022 16:10   CT CHEST ABDOMEN PELVIS W CONTRAST  Result Date: 08/31/2022 CLINICAL DATA:  Sepsis EXAM: CT CHEST, ABDOMEN, AND PELVIS WITH CONTRAST TECHNIQUE: Multidetector CT imaging of the chest, abdomen and pelvis was performed following the standard  protocol during bolus administration of intravenous contrast. RADIATION DOSE REDUCTION: This exam was performed according to the departmental dose-optimization program which includes automated exposure control, adjustment of the mA and/or kV according to patient size and/or use of iterative reconstruction technique. CONTRAST:  75mL OMNIPAQUE IOHEXOL 350 MG/ML SOLN COMPARISON:  None Available. FINDINGS: CHEST: Cardiovascular: No aortic injury. The thoracic aorta is normal in caliber. The heart is normal in size. No significant pericardial effusion. Severe atherosclerotic plaque. The main pulmonary artery is normal in caliber. Four-vessel coronary calcifications status post coronary artery bypass graft. No central pulmonary embolus. Mediastinum/Nodes: No pneumomediastinum. No  mediastinal hematoma. The esophagus is unremarkable. The thyroid is unremarkable. The central airways are patent. No mediastinal, hilar, or axillary lymphadenopathy. Lungs/Pleura: Emphysematous changes. No focal consolidation. No pulmonary nodule. No pulmonary mass. No pulmonary contusion or laceration. No pneumatocele formation. No pleural effusion. No pneumothorax. No hemothorax. Musculoskeletal/Chest wall: No chest wall mass.  Trace bilateral gynecomastia. No acute rib or sternal fracture. Chronic T7, T8, and T9 spinous process fractures. Associated chronic appearing burst fracture of the T9 vertebral body. No retropulsion into the central canal. Approximately at least 40% vertebral body height loss centrally. ABDOMEN / PELVIS: Hepatobiliary: Not enlarged. Vague slightly nodular hypodensity along the falciform ligament also noted on delayed imaging suggestive of focal fatty infiltration. No laceration or subcapsular hematoma. Cholelithiasis.  No biliary ductal dilatation. Pancreas: Normal pancreatic contour. No main pancreatic duct dilatation. Spleen: Not enlarged. No focal lesion. No laceration, subcapsular hematoma, or vascular injury. Adrenals/Urinary Tract: No nodularity bilaterally. Calcification associated with the kidneys likely vascular. No obstructive nephrolithiasis. Bilateral kidneys enhance symmetrically. No hydronephrosis. No contusion, laceration, or subcapsular hematoma. No injury to the vascular structures or collecting systems. No hydroureter. The urinary bladder is unremarkable. Stomach/Bowel: No small or large bowel wall thickening or dilatation. The appendix is not definitely identified with no inflammatory changes in the right lower quadrant to suggest acute appendicitis. Vasculature/Lymphatics: Severe atherosclerotic plaque. No abdominal aorta or iliac aneurysm. No active contrast extravasation or pseudoaneurysm. No abdominal, pelvic, inguinal lymphadenopathy. Reproductive: Normal. Other: No  simple free fluid ascites. No pneumoperitoneum. No hemoperitoneum. No mesenteric hematoma identified. No organized fluid collection. Musculoskeletal: No significant soft tissue hematoma. No acute pelvic fracture.  No acute spinal fracture. Ports and Devices: None. IMPRESSION: 1. No acute intrathoracic, intra-abdominal, intrapelvic traumatic injury. 2. Chronic T9 burst fracture-correlate with point tenderness to palpation to evaluate for an acute component. Otherwise no definite acute fracture or traumatic malalignment of the thoracic or lumbar spine. 3. Other imaging findings of potential clinical significance: Cholelithiasis with no acute cholecystitis. Stool throughout the colon-correlate for constipation. Aortic Atherosclerosis (ICD10-I70.0) and Emphysema (ICD10-J43.9). Electronically Signed   By: Tish Frederickson M.D.   On: 08/31/2022 22:57   CT Head Wo Contrast  Result Date: 08/31/2022 CLINICAL DATA:  Neck trauma (Age >= 65y); Mental status change, unknown cause EXAM: CT HEAD WITHOUT CONTRAST CT CERVICAL SPINE WITHOUT CONTRAST TECHNIQUE: Multidetector CT imaging of the head and cervical spine was performed following the standard protocol without intravenous contrast. Multiplanar CT image reconstructions of the cervical spine were also generated. RADIATION DOSE REDUCTION: This exam was performed according to the departmental dose-optimization program which includes automated exposure control, adjustment of the mA and/or kV according to patient size and/or use of iterative reconstruction technique. COMPARISON:  CT head and C-spine 10/23/2021 FINDINGS: CT HEAD FINDINGS Brain: Cerebral ventricle sizes are concordant with the degree of cerebral volume loss. Trace patchy and confluent areas of decreased attenuation are  noted throughout the deep and periventricular white matter of the cerebral hemispheres bilaterally, compatible with chronic microvascular ischemic disease. No evidence of large-territorial acute  infarction. No parenchymal hemorrhage. No mass lesion. No extra-axial collection. No mass effect or midline shift. No hydrocephalus. Basilar cisterns are patent. Vascular: No hyperdense vessel. Skull: No acute fracture or focal lesion. Sinuses/Orbits: Paranasal sinuses and mastoid air cells are clear. The orbits are unremarkable. Other: 6 mm left frontal scalp hematoma. 13 mm right parieto-occipital scalp hematoma formation. CT CERVICAL SPINE FINDINGS Alignment: Normal. Skull base and vertebrae: Chronic C2 dens fracture with increased separation of the fracture fragments now up to 3 mm (from less than 1 mm). No aggressive appearing focal osseous lesion or focal pathologic process. Soft tissues and spinal canal: No prevertebral fluid or swelling. No visible canal hematoma. Upper chest: Emphysematous changes. Other: Atherosclerotic plaque of the carotid arteries within the neck. IMPRESSION: 1.  No acute intracranial abnormality. 2. Chronic C2 dens fracture with increased separation of the fracture fragments up to 3 mm (from less than 1 mm). Findings may be acute versus chronic. If clinically indicated, consider MRI cervical spine for further evaluation. 3. Otherwise no acute displaced fracture or traumatic listhesis of the cervical spine. 4. A 6 mm left frontal scalp hematoma. 5. A 13 mm right parieto-occipital scalp hematoma formation. 6. Emphysema (ICD10-J43.9). Electronically Signed   By: Tish Frederickson M.D.   On: 08/31/2022 22:37   CT Cervical Spine Wo Contrast  Result Date: 08/31/2022 CLINICAL DATA:  Neck trauma (Age >= 65y); Mental status change, unknown cause EXAM: CT HEAD WITHOUT CONTRAST CT CERVICAL SPINE WITHOUT CONTRAST TECHNIQUE: Multidetector CT imaging of the head and cervical spine was performed following the standard protocol without intravenous contrast. Multiplanar CT image reconstructions of the cervical spine were also generated. RADIATION DOSE REDUCTION: This exam was performed according to  the departmental dose-optimization program which includes automated exposure control, adjustment of the mA and/or kV according to patient size and/or use of iterative reconstruction technique. COMPARISON:  CT head and C-spine 10/23/2021 FINDINGS: CT HEAD FINDINGS Brain: Cerebral ventricle sizes are concordant with the degree of cerebral volume loss. Trace patchy and confluent areas of decreased attenuation are noted throughout the deep and periventricular white matter of the cerebral hemispheres bilaterally, compatible with chronic microvascular ischemic disease. No evidence of large-territorial acute infarction. No parenchymal hemorrhage. No mass lesion. No extra-axial collection. No mass effect or midline shift. No hydrocephalus. Basilar cisterns are patent. Vascular: No hyperdense vessel. Skull: No acute fracture or focal lesion. Sinuses/Orbits: Paranasal sinuses and mastoid air cells are clear. The orbits are unremarkable. Other: 6 mm left frontal scalp hematoma. 13 mm right parieto-occipital scalp hematoma formation. CT CERVICAL SPINE FINDINGS Alignment: Normal. Skull base and vertebrae: Chronic C2 dens fracture with increased separation of the fracture fragments now up to 3 mm (from less than 1 mm). No aggressive appearing focal osseous lesion or focal pathologic process. Soft tissues and spinal canal: No prevertebral fluid or swelling. No visible canal hematoma. Upper chest: Emphysematous changes. Other: Atherosclerotic plaque of the carotid arteries within the neck. IMPRESSION: 1.  No acute intracranial abnormality. 2. Chronic C2 dens fracture with increased separation of the fracture fragments up to 3 mm (from less than 1 mm). Findings may be acute versus chronic. If clinically indicated, consider MRI cervical spine for further evaluation. 3. Otherwise no acute displaced fracture or traumatic listhesis of the cervical spine. 4. A 6 mm left frontal scalp hematoma. 5. A 13 mm right  parieto-occipital scalp  hematoma formation. 6. Emphysema (ICD10-J43.9). Electronically Signed   By: Tish Frederickson M.D.   On: 08/31/2022 22:37   DG Pelvis Portable  Result Date: 08/31/2022 CLINICAL DATA:  Fall, weakness. EXAM: PORTABLE PELVIS 1-2 VIEWS COMPARISON:  None Available. FINDINGS: The cortical margins of the bony pelvis are intact. No fracture. Pubic symphysis and sacroiliac joints are congruent. Both femoral heads are well-seated in the respective acetabula. External artifact projects over the lateral left hip. Extensive arterial vascular calcifications. IMPRESSION: No pelvic fracture. Extensive arterial vascular calcifications. Electronically Signed   By: Narda Rutherford M.D.   On: 08/31/2022 20:38   DG Chest Port 1 View  Result Date: 08/31/2022 CLINICAL DATA:  Weakness nose, fall. EXAM: PORTABLE CHEST 1 VIEW COMPARISON:  Radiograph 06/08/2015 FINDINGS: Prior median sternotomy and CABG. Stable heart size allowing for lower lung volumes. Unchanged mediastinal contours. Aortic atherosclerosis. Patchy airspace opacity at the retrocardiac left lung base. No pneumothorax or large pleural effusion. On limited assessment, no acute osseous abnormalities are seen. IMPRESSION: 1. Patchy airspace opacity at the retrocardiac left lung base. This may represent pneumonia, although possibility of pulmonary contusion is considered in the setting of fall. 2. Prior CABG. Electronically Signed   By: Narda Rutherford M.D.   On: 08/31/2022 20:37    Cardiac Studies:  ECG: afib no acute ST changes    Telemetry: afib rates 100-110   Echo:   Medications:    Chlorhexidine Gluconate Cloth  6 each Topical Daily   diclofenac  1 patch Transdermal BID   magnesium oxide  400 mg Oral BID   metoprolol succinate  100 mg Oral Daily   nicotine  14 mg Transdermal Daily   sodium chloride flush  3 mL Intravenous Q12H   thiamine (VITAMIN B1) injection  100 mg Intravenous Daily      0.9 % NaCl with KCl 40 mEq / L     ceFEPime (MAXIPIME) IV  Stopped (09/01/22 2226)   heparin 1,050 Units/hr (09/02/22 0529)   vancomycin Stopped (09/02/22 0014)    Assessment/Plan:   Elevated Troponin:  history of CABG x 2 no mention of chest pain ECG non acute would observe Check TTE for EF No indication for cath given comorbidities PAF:  start heparin pending issues with C spine, mental status and rhabdomyolysis  Increased Toprol to 100 mg daily  C spine:  chronic C2 dens fracture with more separation post presumed fall and scalp hematoma Neuro surgery consult indicates low likely to heal but will be followed as outpatient  Pneumonia: likely aspiration left lung base on Maxipime    [name] 09/02/2022, 9:53 AM

## 2022-09-02 NOTE — Progress Notes (Signed)
Upon initial assessment, pt c/o of 10/10 pain in his back and neck. Day RN had administered tylenol and added a diclofenac patch to his back. This RN reached out to [facility] on call MD. Rec'd 1 time dose of tramadol 50mg . Upon pain reassessment, pt stated that the pain medication helped "a little bit" Rated pain 8/10. Reached back out to on call MD. No new orders provided from MD d/t "chronic" nature of pain. Will continue to provide non-pharmacological pain methods and comfort for pt.

## 2022-09-02 NOTE — Progress Notes (Signed)
  Echocardiogram 2D Echocardiogram has been performed.  [name] 09/02/2022, 9:49 AM

## 2022-09-02 NOTE — Progress Notes (Signed)
ANTICOAGULATION CONSULT NOTE - Follow Up Consult  Pharmacy Consult for Heparin drip Indication: atrial fibrillation  Allergies  Allergen Reactions   Adhesive [Tape] Other (See Comments)    Takes skin off    Codeine Sulfate Itching and Other (See Comments)    headaches   Hydrocodone    Hydrocodone-Acetaminophen Other (See Comments)   Other Other (See Comments)    Other reaction(s): NAUSEA,VOMITING, Headache   Wound Dressing Adhesive     Other reaction(s): Skin irritation    Patient Measurements: Height: 6' (182.9 cm) Weight: 73.5 kg (162 lb 0.6 oz) IBW/kg (Calculated) : 77.6   Vital Signs: Temp: 97.5 F (36.4 C) (05/26 0736) Temp Source: Axillary (05/26 0736) BP: 136/91 (05/26 0736) Pulse Rate: 114 (05/26 0736)  Labs: Recent Labs    08/31/22 2020 08/31/22 2122 08/31/22 2215 09/01/22 0131 09/01/22 0344 09/01/22 2053 09/02/22 0034 09/02/22 0743  HGB 14.0 13.3  --  11.8*  --   --  12.4*  --   HCT 40.0 39.0  --  35.0*  --   --  36.1*  --   PLT 321  --   --  246  --   --  239  --   APTT 30  --   --   --   --  43* 60* 72*  LABPROT 16.5*  --   --   --   --   --   --   --   INR 1.3*  --   --   --   --   --   --   --   HEPARINUNFRC  --   --   --   --   --   --  1.03*  --   CREATININE 1.31* 1.00  --  1.09  --   --  0.75  --   CKTOTAL 4,821*  --   --   --   --   --  1,956*  --   TROPONINIHS 208*  --  210*  --  220*  --   --   --      Estimated Creatinine Clearance: 72.7 mL/min (by C-G formula based on SCr of 0.75 mg/dL).   Medical History: Past Medical History:  Diagnosis Date   ALLERGIC RHINITIS    Atrial flutter (HCC)    CAD (coronary artery disease)    CAD s/p CABG 1993 and redo 2016,   Carotid artery disease (HCC)    bilateral   Chronic combined systolic and diastolic CHF (congestive heart failure) (HCC)    CKD (chronic kidney disease), stage II    COPD (chronic obstructive pulmonary disease) (HCC)    Diverticulosis    Hyperlipidemia    Osteoarthritis     PAF (paroxysmal atrial fibrillation) (HCC)    Peripheral neuropathy    Personal history of prostate cancer    PVD (peripheral vascular disease) with claudication (HCC)    Radiation proctitis    STEMI (ST elevation myocardial infarction) (HCC) 04/29/2014   PTCA [place] and CFX, in setting of VF/VT arrest and CGS   Tobacco use disorder, continuous    Ventral hernia       Assessment: 81yom with Hx Afib on apixaban pta.   Begin heparin drip for anticoagulation while apixaban on hold  Will monitor heparin drip rate with aptt as heparin level falsely elevated with apixaban use.   aPTT 72 sec is therapeutic on heparin drip 1050 units/hr. No pauses or interruptions to heparin infusion, no bleeding noted per  discussion with RN, cbc stable   Goal of Therapy:  aPTT 66-100 seconds Heparin level 0.3-0.7  Monitor platelets by anticoagulation protocol: Yes   Plan:  Continue Heparin drip 1050 uts/hr  Daily aptt, heparin level and cbc  Mont[place] s/s bleeding Resume po anticoagulation when patient more stable    Leota Sauers Pharm.D. CPP, BCPS Clinical Pharmacist [phone] 09/02/2022 9:39 AM    Please check AMION for all [facility] phone numbers After 10:00 PM, call Main Pharmacy [phone]

## 2022-09-02 NOTE — Evaluation (Signed)
Clinical/Bedside Swallow Evaluation Patient Details  Name: HURL RO MRN: [id number] Date of Birth: 10/29/1940  Today's Date: 09/02/2022 Time: SLP Start Time (ACUTE ONLY): 0900 SLP Stop Time (ACUTE ONLY): 0927 SLP Time Calculation (min) (ACUTE ONLY): 27 min  Past Medical History:  Past Medical History:  Diagnosis Date   ALLERGIC RHINITIS    Atrial flutter (HCC)    CAD (coronary artery disease)    CAD s/p CABG 1993 and redo 2016,   Carotid artery disease (HCC)    bilateral   Chronic combined systolic and diastolic CHF (congestive heart failure) (HCC)    CKD (chronic kidney disease), stage II    COPD (chronic obstructive pulmonary disease) (HCC)    Diverticulosis    Hyperlipidemia    Osteoarthritis    PAF (paroxysmal atrial fibrillation) (HCC)    Peripheral neuropathy    Personal history of prostate cancer    PVD (peripheral vascular disease) with claudication (HCC)    Radiation proctitis    STEMI (ST elevation myocardial infarction) (HCC) 04/29/2014   PTCA L[place] and CFX, in setting of VF/VT arrest and CGS   Tobacco use disorder, continuous    Ventral hernia    Past Surgical History:  Past Surgical History:  Procedure Laterality Date   APPENDECTOMY     CARDIOVERSION N/A 06/24/2014   Procedure: CARDIOVERSION;  Surgeon: Chrystie Nose, MD;  Location: [facility] ENDOSCOPY;  Service: Cardiovascular;  Laterality: N/A;   CORONARY ARTERY BYPASS GRAFT  1992   CORONARY ARTERY BYPASS GRAFT N/A 05/28/2014   Procedure: REDO CORONARY ARTERY BYPASS GRAFTING (CABG);  Surgeon: Delight Ovens, MD;  Location: [facility] OR;  Service: Open Heart Surgery;  Laterality: N/A;  Times 4 using right internal mammary artery to Intermediate artery and endoscopically harvested left saphenous vein to LAD, OM1, and PD coronary arteries.   LEFT HEART CATHETERIZATION WITH CORONARY ANGIOGRAM N/A 04/30/2014   Procedure: LEFT HEART CATHETERIZATION WITH CORONARY ANGIOGRAM;  Surgeon: Peter M Swaziland, MD; [place] 90% (s/p  PTCA), LAD 80%, CFX 100% (s/p PTCA), RCA OK, LIMA-LAD atretic, SVG-OM 100% (chronic), EF 45%, pt req defib x 13 for VF/VT arrest, CHB w/ tem pacer, IABP and intubation   LUMBAR LAMINECTOMY  4098,1191    X2   PROSTATECTOMY  05/2008   Dr. Laverle Patter and XRT   TEE WITHOUT CARDIOVERSION N/A 05/28/2014   Procedure: TRANSESOPHAGEAL ECHOCARDIOGRAM (TEE);  Surgeon: Delight Ovens, MD;  Location: [facility] OR;  Service: Open Heart Surgery;  Laterality: N/A;   TEE WITHOUT CARDIOVERSION N/A 06/24/2014   Procedure: TRANSESOPHAGEAL ECHOCARDIOGRAM (TEE);  Surgeon: Chrystie Nose, MD;  Location: [facility] ENDOSCOPY;  Service: Cardiovascular;  Laterality: N/A;   TONSILLECTOMY     HPI:  The patient is an 82 year old white male with multiple medical problems who by report was found lying on the floor with his wife covered in stool and urine.  He was brought to [facility] and was worked up with a head CT which was unremarkable and a cervical CT which demonstrated a type II odontoid fracture. Patient takes care of his similarly aged wife who has severe dementia and his home is in a state of disrepair and apparently squalor according to the son who lives in [place].    Assessment / Plan / Recommendation  Clinical Impression  Pt demonstrates increased risk of aspiration due to dry oral mucosa, decreased mobility and poor positioning with cervical collar. He is somewhat oriented, able to follow commands and asks appropriate questions. He is dysarthric with generalized  oral weakness and severely dry oral mucosa after suffering from acute dehydration. SLP positioned fully upright and adjusted cervical collar, provided oral care. Pt speech improved somewhat. Pt demonstrated immediate coughing with sips of water. When offered honey via cup and nectar via cup and a straw pt noticed texture difference, but did not have any further immediate coughing/choking episodes. Pt also tolerated pieces of graham crackers and spoon fed bites of  puree. He has already consumed some meals since admission. Though no further immediate signs of aspiration observed today, there is still potential for pharyngeal dysphagia that cannot be evaluated subjectively. For now, recommend nectar thick liquids and dys 3 mech soft solids. SLP will f/u for tolerance and MBS when possible. SLP Visit Diagnosis: Dysphagia, oropharyngeal phase (R13.12)    Aspiration Risk  Moderate aspiration risk    Diet Recommendation Dysphagia 3 (Mech soft);Nectar-thick liquid   Liquid Administration via: Cup;Straw Medication Administration: Whole meds with puree Supervision: Staff to assist with self feeding Compensations: Slow rate;Small sips/bites Postural Changes: Seated upright at 90 degrees (pull upright before meals - posted)    Other  Recommendations Oral Care Recommendations: Oral care BID Caregiver Recommendations: Have oral suction available    Recommendations for follow up therapy are one component of a multi-disciplinary discharge planning process, led by the attending physician.  Recommendations may be updated based on patient status, additional functional criteria and insurance authorization.  Follow up Recommendations Skilled nursing-short term rehab (<3 hours/day)      Assistance Recommended at Discharge    Functional Status Assessment Patient has had a recent decline in their functional status and demonstrates the ability to make significant improvements in function in a reasonable and predictable amount of time.  Frequency and Duration min 2x/week  2 weeks       Prognosis Prognosis for improved oropharyngeal function: Good      Swallow Study   General HPI: The patient is an 82 year old white male with multiple medical problems who by report was found lying on the floor with his wife covered in stool and urine.  He was brought to [facility] and was worked up with a head CT which was unremarkable and a cervical CT which demonstrated a  type II odontoid fracture. Patient takes care of his similarly aged wife who has severe dementia and his home is in a state of disrepair and apparently squalor according to the son who lives in [place]. Type of Study: Bedside Swallow Evaluation Previous Swallow Assessment: none Diet Prior to this Study: Dysphagia 3 (mechanical soft);Thin liquids (Level 0) Temperature Spikes Noted: No Respiratory Status: Nasal cannula History of Recent Intubation: No Behavior/Cognition: Alert;Pleasant mood;Cooperative Oral Cavity Assessment: Dry Oral Care Completed by SLP: Yes Oral Cavity - Dentition: Adequate natural dentition (upper partia) Vision: Functional for self-feeding Self-Feeding Abilities: Needs assist Patient Positioning: Upright in bed Volitional Cough: Strong Volitional Swallow: Able to elicit    Oral/Motor/Sensory Function Overall Oral Motor/Sensory Function: Generalized oral weakness   Ice Chips     Thin Liquid Thin Liquid: Impaired Presentation: Straw Pharyngeal  Phase Impairments: Cough - Immediate;Multiple swallows    Nectar Thick Nectar Thick Liquid: Impaired Presentation: Straw;Cup Pharyngeal Phase Impairments: Multiple swallows   Honey Thick Honey Thick Liquid: Impaired Presentation: Straw;Cup Pharyngeal Phase Impairments: Multiple swallows   Puree Puree: Within functional limits   Solid     Solid: Within functional limits      Tyanne Derocher, Riley Nearing 09/02/2022,9:32 AM

## 2022-09-02 NOTE — Progress Notes (Signed)
PROGRESS NOTE   PAGE [name]  NWG:[id number] DOB: 08/24/1940 DOA: 08/31/2022 PCP: Tresa Garter, MD  Brief Narrative:  82 year old home dwelling male ICM EF 45-50% 2016+ CABG 1993, redo 2016 CABG X4 A flutter chronic anticoagulation Eliquis CHADVASC >3-previously on amiodarone PVD + stent L SFA/right SFA 2011 Prior heavy smoker Macular degeneration Chronic lumbago Patient allegedly had a fall last year at his home falling up the stairs hit his neck and had a 1 mm dens fracture that was nonoperable at the time  Brought to emergency room with complaint of fall found down on the kitchen floor lethargic confused covered in feces foul-smelling urine c-collar in place heart rate 120 and A-fib given 500 cc bolus Workup = BUNs/creatinine 35/1.3 (baseline 1.0), AST/ALT 104/51 baseline normal bilirubin 1.5 Troponin trend 208-220, lactic acid 2.3-3.6 Potassium 3.3 W BC 9.2, hemoglobin 11.8, platelet 246 CXR patchy airspace opacity retrocardiac LL base?  Pulmonary contusion?  Pneumonia--pelvic x-rays negative for fracture CT neck = chronic C2 dens fracture increase separation of fracture segments >3 mm, chronic T9 burst fracture CT head 6 mm left frontal scalp hematoma, 13 mm right parietal occipital scalp hematoma  Neurosurgery consulted by EDP Cardiology consulted by admitting physician regarding elevated troponin   Hospital-Problem based course  Severe sepsis on admission DDx urinary infection?  Aspiration-lactic acidosis 3.6 peak on admission  Continue vancomycin/cefepime IV, saline as below BC + UC 5/24 NGTD Dys 3 diet-await SLP  Elevated troponin demand ischemia/ICM EF 45-50% with redo CABG 2016 x 4 IV heparin GTT as per cardiology who was consulted on admission Metoprolol XL 50 home dose resumed--- Eliquis held in favor of heparin-unlikely surgical candidate can probably transition back to Eliquis Timing aspirin/Plavix as well as whether patient should be on this  long-term in addition to Eliquis as per cardiology or can be addressed as outpatient with vascular  Flutter/fib paroxysmal CHADVASC >3 Still somewhat tachycardic-defer adjustment of beta-blocker/addition CCB to cardiology Await echo  Hypokalemia Magnesium 1.9 so we will give oral replacement 400 twice daily Mag-Ox--Mag is ok Replace aggressively saline + K40@100  cc/H and saline lock likely a.m.  Fall, rhabdomyolysis CK trending down Keep Foley-voiding trial 5/26 AM  C2 dens fracture with increasing separation >3 mm Chronic T9 burst fracture D/w Dr. Cameron Ali do pt/ot as per Dr. Danie Chandler restrictions and wear collar and keep neck neutral Patient is a poor operative candidate for C2 dens fracture but can undergo therapy with collar in place  Left hip pain with external rotation at hip Plain pelvic x-rays were negative No further workup as CT of left hip is -5/25  PVD Chronic smoker Eventually resume asa/plavix?  Social Patient takes care of his similarly aged wife who has severe dementia and his home is in a state of disrepair and apparently squalor according to the son who lives in [place] Therapy to evaluate and I will discuss implications with the patient himself once he stabilizes little more  DVT prophylaxis: IV heparin Code Status: Presumed full Family Communication: No family present today  Disposition:  Status is: Inpatient Remains inpatient appropriate because:   Requires further workup rate control etc. Requires CT hip to rule out occult fracture     Subjective:  minimally confused-per night nurse thinks that Montez Morita is the president but knows that this is 2024 He seems uncomfortable and had to be put back into restraints secondary to mild agitation  C/o LBP in center of back [? Area of t9 #]  Objective: Vitals:   09/01/22  1943 09/02/22 0003 09/02/22 0319 09/02/22 0425  BP: 139/74 131/76 (!) 148/78   Pulse: 100 (!) 103 91   Resp: 18 20 20    Temp:  98.5 F (36.9 C) 97.6 F (36.4 C) 97.6 F (36.4 C)   TempSrc: Oral Oral Oral   SpO2: 94% 95% 96%   Weight:    73.5 kg  Height:        Intake/Output Summary (Last 24 hours) at 09/02/2022 0707 Last data filed at 09/02/2022 0529 Gross per 24 hour  Intake 1671.98 ml  Output 1900 ml  Net -228.02 ml    Filed Weights   08/31/22 2008 09/01/22 0230 09/02/22 0425  Weight: 77.1 kg 73.7 kg 73.5 kg    Examination:  EOMI NCAT without deficit did not test finger-nose-finger no icterus no pallor--small hematomas over scalp S1-S2 A-fib on monitors not controlled--120's some PVC Abdomen is soft no rebound no guarding Reflexes are brisk in the knees Rest of neuroexam deferred    Data Reviewed: personally reviewed   CBC    Component Value Date/Time   WBC 10.6 (H) 09/02/2022 0034   RBC 3.68 (L) 09/02/2022 0034   HGB 12.4 (L) 09/02/2022 0034   HGB 13.7 10/20/2019 1331   HGB 13.2 12/01/2008 1247   HCT 36.1 (L) 09/02/2022 0034   HCT 38.9 10/20/2019 1331   HCT 38.3 (L) 12/01/2008 1247   PLT 239 09/02/2022 0034   PLT 231 10/20/2019 1331   MCV 98.1 09/02/2022 0034   MCV 97 10/20/2019 1331   MCV 99.9 (H) 12/01/2008 1247   MCH 33.7 09/02/2022 0034   MCHC 34.3 09/02/2022 0034   RDW 12.6 09/02/2022 0034   RDW 11.9 10/20/2019 1331   RDW 12.7 12/01/2008 1247   LYMPHSABS 0.3 (L) 08/31/2022 2020   LYMPHSABS 0.7 (L) 12/01/2008 1247   MONOABS 0.9 08/31/2022 2020   MONOABS 0.8 12/01/2008 1247   EOSABS 0.0 08/31/2022 2020   EOSABS 0.5 12/01/2008 1247   BASOSABS 0.0 08/31/2022 2020   BASOSABS 0.0 12/01/2008 1247      Latest Ref Rng & Units 09/02/2022   12:34 AM 09/01/2022    1:31 AM 08/31/2022    9:22 PM  CMP  Glucose 70 - 99 mg/dL 161  096  045   BUN 8 - 23 mg/dL 27  31  33   Creatinine 0.61 - 1.24 mg/dL 4.09  8.11  9.14   Sodium 135 - 145 mmol/L 142  141  144   Potassium 3.5 - 5.1 mmol/L 3.1  3.2  3.3   Chloride 98 - 111 mmol/L 107  107  106   CO2 22 - 32 mmol/L 24  22    Calcium  8.9 - 10.3 mg/dL 8.8  8.8    Total Protein 6.5 - 8.1 g/dL  4.5    Total Bilirubin 0.3 - 1.2 mg/dL  1.3    Alkaline Phos 38 - 126 U/L  43    AST 15 - 41 U/L  93    ALT 0 - 44 U/L  43       Radiology Studies: CT HIP LEFT WO CONTRAST  Result Date: 09/01/2022 CLINICAL DATA:  Left hip pain after fall. EXAM: CT OF THE LEFT HIP WITHOUT CONTRAST TECHNIQUE: Multidetector CT imaging of the left hip was performed according to the standard protocol. Multiplanar CT image reconstructions were also generated. RADIATION DOSE REDUCTION: This exam was performed according to the departmental dose-optimization program which includes automated exposure control, adjustment of the mA and/or  kV according to patient size and/or use of iterative reconstruction technique. COMPARISON:  CT abdomen pelvis from yesterday. FINDINGS: Bones/Joint/Cartilage No fracture or dislocation. Mild left hip osteoarthritis. Trace left hip joint effusion. Ligaments Ligaments are suboptimally evaluated by CT. Muscles and Tendons Grossly intact. Soft tissue No fluid collection or hematoma. No soft tissue mass. Bladder decompressed by Foley catheter. IMPRESSION: 1. No acute osseous abnormality. 2. Mild left hip osteoarthritis. Electronically Signed   By: Obie Dredge M.D.   On: 09/01/2022 16:10   CT CHEST ABDOMEN PELVIS W CONTRAST  Result Date: 08/31/2022 CLINICAL DATA:  Sepsis EXAM: CT CHEST, ABDOMEN, AND PELVIS WITH CONTRAST TECHNIQUE: Multidetector CT imaging of the chest, abdomen and pelvis was performed following the standard protocol during bolus administration of intravenous contrast. RADIATION DOSE REDUCTION: This exam was performed according to the departmental dose-optimization program which includes automated exposure control, adjustment of the mA and/or kV according to patient size and/or use of iterative reconstruction technique. CONTRAST:  75mL OMNIPAQUE IOHEXOL 350 MG/ML SOLN COMPARISON:  None Available. FINDINGS: CHEST:  Cardiovascular: No aortic injury. The thoracic aorta is normal in caliber. The heart is normal in size. No significant pericardial effusion. Severe atherosclerotic plaque. The main pulmonary artery is normal in caliber. Four-vessel coronary calcifications status post coronary artery bypass graft. No central pulmonary embolus. Mediastinum/Nodes: No pneumomediastinum. No mediastinal hematoma. The esophagus is unremarkable. The thyroid is unremarkable. The central airways are patent. No mediastinal, hilar, or axillary lymphadenopathy. Lungs/Pleura: Emphysematous changes. No focal consolidation. No pulmonary nodule. No pulmonary mass. No pulmonary contusion or laceration. No pneumatocele formation. No pleural effusion. No pneumothorax. No hemothorax. Musculoskeletal/Chest wall: No chest wall mass.  Trace bilateral gynecomastia. No acute rib or sternal fracture. Chronic T7, T8, and T9 spinous process fractures. Associated chronic appearing burst fracture of the T9 vertebral body. No retropulsion into the central canal. Approximately at least 40% vertebral body height loss centrally. ABDOMEN / PELVIS: Hepatobiliary: Not enlarged. Vague slightly nodular hypodensity along the falciform ligament also noted on delayed imaging suggestive of focal fatty infiltration. No laceration or subcapsular hematoma. Cholelithiasis.  No biliary ductal dilatation. Pancreas: Normal pancreatic contour. No main pancreatic duct dilatation. Spleen: Not enlarged. No focal lesion. No laceration, subcapsular hematoma, or vascular injury. Adrenals/Urinary Tract: No nodularity bilaterally. Calcification associated with the kidneys likely vascular. No obstructive nephrolithiasis. Bilateral kidneys enhance symmetrically. No hydronephrosis. No contusion, laceration, or subcapsular hematoma. No injury to the vascular structures or collecting systems. No hydroureter. The urinary bladder is unremarkable. Stomach/Bowel: No small or large bowel wall  thickening or dilatation. The appendix is not definitely identified with no inflammatory changes in the right lower quadrant to suggest acute appendicitis. Vasculature/Lymphatics: Severe atherosclerotic plaque. No abdominal aorta or iliac aneurysm. No active contrast extravasation or pseudoaneurysm. No abdominal, pelvic, inguinal lymphadenopathy. Reproductive: Normal. Other: No simple free fluid ascites. No pneumoperitoneum. No hemoperitoneum. No mesenteric hematoma identified. No organized fluid collection. Musculoskeletal: No significant soft tissue hematoma. No acute pelvic fracture.  No acute spinal fracture. Ports and Devices: None. IMPRESSION: 1. No acute intrathoracic, intra-abdominal, intrapelvic traumatic injury. 2. Chronic T9 burst fracture-correlate with point tenderness to palpation to evaluate for an acute component. Otherwise no definite acute fracture or traumatic malalignment of the thoracic or lumbar spine. 3. Other imaging findings of potential clinical significance: Cholelithiasis with no acute cholecystitis. Stool throughout the colon-correlate for constipation. Aortic Atherosclerosis (ICD10-I70.0) and Emphysema (ICD10-J43.9). Electronically Signed   By: Tish Frederickson M.D.   On: 08/31/2022 22:57   CT Head Wo  Contrast  Result Date: 08/31/2022 CLINICAL DATA:  Neck trauma (Age >= 65y); Mental status change, unknown cause EXAM: CT HEAD WITHOUT CONTRAST CT CERVICAL SPINE WITHOUT CONTRAST TECHNIQUE: Multidetector CT imaging of the head and cervical spine was performed following the standard protocol without intravenous contrast. Multiplanar CT image reconstructions of the cervical spine were also generated. RADIATION DOSE REDUCTION: This exam was performed according to the departmental dose-optimization program which includes automated exposure control, adjustment of the mA and/or kV according to patient size and/or use of iterative reconstruction technique. COMPARISON:  CT head and C-spine  10/23/2021 FINDINGS: CT HEAD FINDINGS Brain: Cerebral ventricle sizes are concordant with the degree of cerebral volume loss. Trace patchy and confluent areas of decreased attenuation are noted throughout the deep and periventricular white matter of the cerebral hemispheres bilaterally, compatible with chronic microvascular ischemic disease. No evidence of large-territorial acute infarction. No parenchymal hemorrhage. No mass lesion. No extra-axial collection. No mass effect or midline shift. No hydrocephalus. Basilar cisterns are patent. Vascular: No hyperdense vessel. Skull: No acute fracture or focal lesion. Sinuses/Orbits: Paranasal sinuses and mastoid air cells are clear. The orbits are unremarkable. Other: 6 mm left frontal scalp hematoma. 13 mm right parieto-occipital scalp hematoma formation. CT CERVICAL SPINE FINDINGS Alignment: Normal. Skull base and vertebrae: Chronic C2 dens fracture with increased separation of the fracture fragments now up to 3 mm (from less than 1 mm). No aggressive appearing focal osseous lesion or focal pathologic process. Soft tissues and spinal canal: No prevertebral fluid or swelling. No visible canal hematoma. Upper chest: Emphysematous changes. Other: Atherosclerotic plaque of the carotid arteries within the neck. IMPRESSION: 1.  No acute intracranial abnormality. 2. Chronic C2 dens fracture with increased separation of the fracture fragments up to 3 mm (from less than 1 mm). Findings may be acute versus chronic. If clinically indicated, consider MRI cervical spine for further evaluation. 3. Otherwise no acute displaced fracture or traumatic listhesis of the cervical spine. 4. A 6 mm left frontal scalp hematoma. 5. A 13 mm right parieto-occipital scalp hematoma formation. 6. Emphysema (ICD10-J43.9). Electronically Signed   By: Tish Frederickson M.D.   On: 08/31/2022 22:37   CT Cervical Spine Wo Contrast  Result Date: 08/31/2022 CLINICAL DATA:  Neck trauma (Age >= 65y); Mental  status change, unknown cause EXAM: CT HEAD WITHOUT CONTRAST CT CERVICAL SPINE WITHOUT CONTRAST TECHNIQUE: Multidetector CT imaging of the head and cervical spine was performed following the standard protocol without intravenous contrast. Multiplanar CT image reconstructions of the cervical spine were also generated. RADIATION DOSE REDUCTION: This exam was performed according to the departmental dose-optimization program which includes automated exposure control, adjustment of the mA and/or kV according to patient size and/or use of iterative reconstruction technique. COMPARISON:  CT head and C-spine 10/23/2021 FINDINGS: CT HEAD FINDINGS Brain: Cerebral ventricle sizes are concordant with the degree of cerebral volume loss. Trace patchy and confluent areas of decreased attenuation are noted throughout the deep and periventricular white matter of the cerebral hemispheres bilaterally, compatible with chronic microvascular ischemic disease. No evidence of large-territorial acute infarction. No parenchymal hemorrhage. No mass lesion. No extra-axial collection. No mass effect or midline shift. No hydrocephalus. Basilar cisterns are patent. Vascular: No hyperdense vessel. Skull: No acute fracture or focal lesion. Sinuses/Orbits: Paranasal sinuses and mastoid air cells are clear. The orbits are unremarkable. Other: 6 mm left frontal scalp hematoma. 13 mm right parieto-occipital scalp hematoma formation. CT CERVICAL SPINE FINDINGS Alignment: Normal. Skull base and vertebrae: Chronic C2 dens fracture  with increased separation of the fracture fragments now up to 3 mm (from less than 1 mm). No aggressive appearing focal osseous lesion or focal pathologic process. Soft tissues and spinal canal: No prevertebral fluid or swelling. No visible canal hematoma. Upper chest: Emphysematous changes. Other: Atherosclerotic plaque of the carotid arteries within the neck. IMPRESSION: 1.  No acute intracranial abnormality. 2. Chronic C2 dens  fracture with increased separation of the fracture fragments up to 3 mm (from less than 1 mm). Findings may be acute versus chronic. If clinically indicated, consider MRI cervical spine for further evaluation. 3. Otherwise no acute displaced fracture or traumatic listhesis of the cervical spine. 4. A 6 mm left frontal scalp hematoma. 5. A 13 mm right parieto-occipital scalp hematoma formation. 6. Emphysema (ICD10-J43.9). Electronically Signed   By: Tish Frederickson M.D.   On: 08/31/2022 22:37   DG Pelvis Portable  Result Date: 08/31/2022 CLINICAL DATA:  Fall, weakness. EXAM: PORTABLE PELVIS 1-2 VIEWS COMPARISON:  None Available. FINDINGS: The cortical margins of the bony pelvis are intact. No fracture. Pubic symphysis and sacroiliac joints are congruent. Both femoral heads are well-seated in the respective acetabula. External artifact projects over the lateral left hip. Extensive arterial vascular calcifications. IMPRESSION: No pelvic fracture. Extensive arterial vascular calcifications. Electronically Signed   By: Narda Rutherford M.D.   On: 08/31/2022 20:38   DG Chest Port 1 View  Result Date: 08/31/2022 CLINICAL DATA:  Weakness nose, fall. EXAM: PORTABLE CHEST 1 VIEW COMPARISON:  Radiograph 06/08/2015 FINDINGS: Prior median sternotomy and CABG. Stable heart size allowing for lower lung volumes. Unchanged mediastinal contours. Aortic atherosclerosis. Patchy airspace opacity at the retrocardiac left lung base. No pneumothorax or large pleural effusion. On limited assessment, no acute osseous abnormalities are seen. IMPRESSION: 1. Patchy airspace opacity at the retrocardiac left lung base. This may represent pneumonia, although possibility of pulmonary contusion is considered in the setting of fall. 2. Prior CABG. Electronically Signed   By: Narda Rutherford M.D.   On: 08/31/2022 20:37     Scheduled Meds:  Chlorhexidine Gluconate Cloth  6 each Topical Daily   magnesium oxide  400 mg Oral BID    metoprolol succinate  100 mg Oral Daily   nicotine  14 mg Transdermal Daily   sodium chloride flush  3 mL Intravenous Q12H   thiamine (VITAMIN B1) injection  100 mg Intravenous Daily   Continuous Infusions:  0.9 % NaCl with KCl 40 mEq / L     ceFEPime (MAXIPIME) IV Stopped (09/01/22 2226)   heparin 1,050 Units/hr (09/02/22 0529)   vancomycin Stopped (09/02/22 0014)     LOS: 1 day   Time spent: 70 including care coordination time with multiple consultants  Rhetta Mura, MD [facility] To contact the attending provider between 7A-7P or the covering provider during after hours 7P-7A, please log into the web site [email or web address] and access using universal [place] password for that web site. If you do not have the password, please call the hospital operator.  09/02/2022, 7:07 AM

## 2022-09-02 NOTE — Evaluation (Signed)
Physical Therapy Evaluation Patient Details Name: [name] MRN: [id number] DOB: 1941/01/18 Today's Date: 09/02/2022  History of Present Illness  Patient is an 82 y/o male admitted 08/31/22 after found on floor lethargic, confused covered in feces and foul smelling urine with c-collar in place HR 120 a-fib and concern for severe sepsis.  PMH positive for macular degeneration, chronic lumbago, PVD w/ stent L SFA, R SFA. A-flutter, ICM EF 45-50% CABG 1993 & re-do 2016, h/o fall on stairs with 1mm dens fracture non-operable.  Clinical Impression  Patient presents with decreased mobility due to pain, limited activity tolerance, decreased balance and decreased safety awareness.  Patient with fall at home and decreased cognition (fluctuating per RN).  Currently mod A for up to chair and +2 for bed mobility due to pain.  Previously independent with rollator caring for his wife at home.  Feel he will need continued skilled PT in the acute setting and follow up inpatient rehab < 3 hour/day prior to return home.        Recommendations for follow up therapy are one component of a multi-disciplinary discharge planning process, led by the attending physician.  Recommendations may be updated based on patient status, additional functional criteria and insurance authorization.  Follow Up Recommendations Can patient physically be transported by private vehicle: Yes     Assistance Recommended at Discharge Frequent or constant Supervision/Assistance  Patient can return home with the following  A lot of help with bathing/dressing/bathroom;Assist for transportation;Help with stairs or ramp for entrance;A lot of help with walking and/or transfers;Direct supervision/assist for medications management;Assistance with cooking/housework    Equipment Recommendations None recommended by PT  Recommendations for Other Services       Functional Status Assessment Patient has had a recent decline in their  functional status and demonstrates the ability to make significant improvements in function in a reasonable and predictable amount of time.     Precautions / Restrictions Precautions Precautions: Fall;Cervical Required Braces or Orthoses: Cervical Brace Cervical Brace: At all times      Mobility  Bed Mobility Overal bed mobility: Needs Assistance Bed Mobility: Supine to Sit     Supine to sit: HOB elevated, +2 for physical assistance, Mod assist     General bed mobility comments: assist to initiate scooting hips and lifting trunk due to pain needing +2 A    Transfers Overall transfer level: Needs assistance Equipment used: Rolling walker (2 wheels) Transfers: Sit to/from Stand, Bed to chair/wheelchair/BSC Sit to Stand: Min assist, +2 safety/equipment, From elevated surface   Step pivot transfers: Mod assist, +2 safety/equipment       General transfer comment: assist from EOB to stand with slightly increased height of bed for balance, then stepping to recliner with RW pt with LOB needing mod A to correct and cues for safety with stand to sit, +2 for equipment    Ambulation/Gait               General Gait Details: patient declined to ambulate  Stairs            Wheelchair Mobility    Modified Rankin (Stroke Patients Only)       Balance Overall balance assessment: Needs assistance   Sitting balance-Leahy Scale: Fair     Standing balance support: Bilateral upper extremity supported Standing balance-Leahy Scale: Poor Standing balance comment: UE support in static standing and LOB with stepping with RW  Pertinent Vitals/Pain      Home Living Family/patient expects to be discharged to:: Private residence Living Arrangements: Spouse/significant other Available Help at Discharge: Other (Comment) (none) Type of Home: House Home Access: Level entry       Home Layout: Two level;Able to live on main level with  bedroom/bathroom Home Equipment: Gilmer Mor - single point;Shower seat;Grab bars - tub/shower;Hand held Programmer, systems (2 wheels)      Prior Function Prior Level of Function : Independent/Modified Independent;Driving;Other (comment) (caregiver of his wife)             Mobility Comments: walks with a cane ADLs Comments: managed meds and finances     Hand Dominance   Dominant Hand: Right    Extremity/Trunk Assessment   Upper Extremity Assessment Upper Extremity Assessment: Defer to OT evaluation    Lower Extremity Assessment Lower Extremity Assessment: RLE deficits/detail;LLE deficits/detail RLE Deficits / Details: moves antigravity, denies numbness in feet, but reports pain in feet and generalized, strength not formally tested, but stands with min A from EOB LLE Deficits / Details: moves antigravity, denies numbness in feet, but reports pain in feet and generalized, strength not formally tested, but stands with min A from EOB    Cervical / Trunk Assessment Cervical / Trunk Assessment: Kyphotic;Other exceptions Cervical / Trunk Exceptions: cervical fracture  Communication   Communication: HOH  Cognition                                                General Comments General comments (skin integrity, edema, etc.): on RA at rest SpO2 94%; HR maintanted in 90's, low 100s, BP Stable; couging up brown sputum during session    Exercises     Assessment/Plan    PT Assessment Patient needs continued PT services  PT Problem List Decreased strength;Decreased cognition;Decreased activity tolerance;Decreased balance;Decreased mobility;Cardiopulmonary status limiting activity;Decreased safety awareness;Decreased knowledge of use of DME;Pain       PT Treatment Interventions DME instruction;Balance training;Gait training;Cognitive remediation;Functional mobility training;Patient/family education;Therapeutic activities;Therapeutic exercise    PT Goals  (Current goals can be found in the Care Plan section)  Acute Rehab PT Goals Patient Stated Goal: to feel better PT Goal Formulation: With patient Time For Goal Achievement: 09/16/22 Potential to Achieve Goals: Fair    Frequency Min 3X/week     Co-evaluation PT/OT/SLP Co-Evaluation/Treatment: Yes Reason for Co-Treatment: For patient/therapist safety;To address functional/ADL transfers PT goals addressed during session: Mobility/safety with mobility;Balance         AM-PAC PT "6 Clicks" Mobility  Outcome Measure Help needed turning from your back to your side while in a flat bed without using bedrails?: Total Help needed moving from lying on your back to sitting on the side of a flat bed without using bedrails?: Total Help needed moving to and from a bed to a chair (including a wheelchair)?: A Lot Help needed standing up from a chair using your arms (e.g., wheelchair or bedside chair)?: A Little Help needed to walk in hospital room?: Total Help needed climbing 3-5 steps with a railing? : Total 6 Click Score: 9    End of Session Equipment Utilized During Treatment: Gait belt;Cervical collar Activity Tolerance: Patient limited by fatigue Patient left: in chair;with chair alarm set;with call bell/phone within reach Nurse Communication: Mobility status PT Visit Diagnosis: Other abnormalities of gait and mobility (R26.89);History of falling (Z91.81);Other  symptoms and signs involving the nervous system (R29.898);Pain Pain - part of body:  (upper back)    Time: 4098-1191 PT Time Calculation (min) (ACUTE ONLY): 28 min   Charges:   PT Evaluation $PT Eval Moderate Complexity: 1 Mod          Cyndi Dayshon Roback, PT Acute Rehabilitation Services Office:[phone] 09/02/2022   Elray Mcgregor 09/02/2022, 10:56 AM

## 2022-09-02 NOTE — Progress Notes (Signed)
Pharmacy Antibiotic Note  [name] is a 82 y.o. male admitted on 08/31/2022 presenting found down, concern for sepsis.  Pharmacy has been consulted for vancomycin and cefepime dosing. Cr 1 crcl >75ml/min  Plan: Vancomycin 1500 mg IV x 1, then 750 mg IV q 12h (eAUC 501) Increase Cefepime 2g IV every 8hr Monitor renal function, Vancomycin levels as indicated  Height: 6' (182.9 cm) Weight: 73.5 kg (162 lb 0.6 oz) IBW/kg (Calculated) : 77.6  Temp (24hrs), Avg:98 F (36.7 C), Min:97.5 F (36.4 C), Max:99.1 F (37.3 C)  Recent Labs  Lab 08/31/22 2020 08/31/22 2122 08/31/22 2245 09/01/22 0131 09/01/22 0344 09/02/22 0034  WBC 9.2  --   --  10.1  --  10.6*  CREATININE 1.31* 1.00  --  1.09  --  0.75  LATICACIDVEN 2.3*  --  2.6* 3.6* 1.9  --      Estimated Creatinine Clearance: 72.7 mL/min (by C-G formula based on SCr of 0.75 mg/dL).    Allergies  Allergen Reactions   Adhesive [Tape] Other (See Comments)    Takes skin off    Codeine Sulfate Itching and Other (See Comments)    headaches   Hydrocodone    Hydrocodone-Acetaminophen Other (See Comments)   Other Other (See Comments)    Other reaction(s): NAUSEA,VOMITING, Headache   Wound Dressing Adhesive     Other reaction(s): Skin irritation      Leota Sauers Pharm.D. CPP, BCPS Clinical Pharmacist [phone] 09/02/2022 9:41 AM

## 2022-09-03 DIAGNOSIS — M6282 Rhabdomyolysis: Secondary | ICD-10-CM | POA: Diagnosis not present

## 2022-09-03 DIAGNOSIS — R400 Somnolence: Secondary | ICD-10-CM | POA: Diagnosis not present

## 2022-09-03 DIAGNOSIS — Z9861 Coronary angioplasty status: Secondary | ICD-10-CM

## 2022-09-03 DIAGNOSIS — R079 Chest pain, unspecified: Secondary | ICD-10-CM

## 2022-09-03 DIAGNOSIS — I739 Peripheral vascular disease, unspecified: Secondary | ICD-10-CM

## 2022-09-03 DIAGNOSIS — N179 Acute kidney failure, unspecified: Secondary | ICD-10-CM

## 2022-09-03 DIAGNOSIS — I2510 Atherosclerotic heart disease of native coronary artery without angina pectoris: Secondary | ICD-10-CM

## 2022-09-03 DIAGNOSIS — R7989 Other specified abnormal findings of blood chemistry: Secondary | ICD-10-CM | POA: Diagnosis not present

## 2022-09-03 DIAGNOSIS — S12100A Unspecified displaced fracture of second cervical vertebra, initial encounter for closed fracture: Secondary | ICD-10-CM | POA: Diagnosis not present

## 2022-09-03 DIAGNOSIS — A419 Sepsis, unspecified organism: Secondary | ICD-10-CM | POA: Diagnosis not present

## 2022-09-03 DIAGNOSIS — I480 Paroxysmal atrial fibrillation: Secondary | ICD-10-CM | POA: Diagnosis not present

## 2022-09-03 DIAGNOSIS — Z7189 Other specified counseling: Secondary | ICD-10-CM

## 2022-09-03 DIAGNOSIS — N390 Urinary tract infection, site not specified: Secondary | ICD-10-CM

## 2022-09-03 LAB — CBC WITH DIFFERENTIAL/PLATELET
Abs Immature Granulocytes: 0.02 10*3/uL (ref 0.00–0.07)
Basophils Absolute: 0 10*3/uL (ref 0.0–0.1)
Basophils Relative: 1 %
Eosinophils Absolute: 0.2 10*3/uL (ref 0.0–0.5)
Eosinophils Relative: 3 %
HCT: 35.8 % — ABNORMAL LOW (ref 39.0–52.0)
Hemoglobin: 12.1 g/dL — ABNORMAL LOW (ref 13.0–17.0)
Immature Granulocytes: 0 %
Lymphocytes Relative: 10 %
Lymphs Abs: 0.9 10*3/uL (ref 0.7–4.0)
MCH: 33 pg (ref 26.0–34.0)
MCHC: 33.8 g/dL (ref 30.0–36.0)
MCV: 97.5 fL (ref 80.0–100.0)
Monocytes Absolute: 0.8 10*3/uL (ref 0.1–1.0)
Monocytes Relative: 9 %
Neutro Abs: 6.7 10*3/uL (ref 1.7–7.7)
Neutrophils Relative %: 77 %
Platelets: 218 10*3/uL (ref 150–400)
RBC: 3.67 MIL/uL — ABNORMAL LOW (ref 4.22–5.81)
RDW: 12.6 % (ref 11.5–15.5)
WBC: 8.6 10*3/uL (ref 4.0–10.5)
nRBC: 0 % (ref 0.0–0.2)

## 2022-09-03 LAB — APTT: aPTT: 78 seconds — ABNORMAL HIGH (ref 24–36)

## 2022-09-03 LAB — HEPARIN LEVEL (UNFRACTIONATED): Heparin Unfractionated: 0.89 IU/mL — ABNORMAL HIGH (ref 0.30–0.70)

## 2022-09-03 LAB — BASIC METABOLIC PANEL
Anion gap: 8 (ref 5–15)
BUN: 23 mg/dL (ref 8–23)
CO2: 24 mmol/L (ref 22–32)
Calcium: 9 mg/dL (ref 8.9–10.3)
Chloride: 109 mmol/L (ref 98–111)
Creatinine, Ser: 0.88 mg/dL (ref 0.61–1.24)
GFR, Estimated: >60 mL/min (ref >60)
Glucose, Bld: 96 mg/dL (ref 70–99)
Potassium: 4.1 mmol/L (ref 3.5–5.1)
Sodium: 141 mmol/L (ref 135–145)

## 2022-09-03 LAB — CULTURE, BLOOD (ROUTINE X 2): Culture: NO GROWTH

## 2022-09-03 LAB — CBC
HCT: 37.1 % — ABNORMAL LOW (ref 39.0–52.0)
Hemoglobin: 12.5 g/dL — ABNORMAL LOW (ref 13.0–17.0)
MCH: 33 pg (ref 26.0–34.0)
MCHC: 33.7 g/dL (ref 30.0–36.0)
MCV: 97.9 fL (ref 80.0–100.0)
Platelets: 238 10*3/uL (ref 150–400)
RBC: 3.79 MIL/uL — ABNORMAL LOW (ref 4.22–5.81)
RDW: 12.6 % (ref 11.5–15.5)
WBC: 7.4 10*3/uL (ref 4.0–10.5)
nRBC: 0 % (ref 0.0–0.2)

## 2022-09-03 LAB — CK: Total CK: 795 U/L — ABNORMAL HIGH (ref 49–397)

## 2022-09-03 MED ORDER — CEFDINIR 300 MG PO CAPS: 300.0000 mg | ORAL_CAPSULE | Freq: Two times a day (BID) | ORAL | Status: DC

## 2022-09-03 MED ORDER — LOSARTAN POTASSIUM 25 MG PO TABS
25.0000 mg | ORAL_TABLET | Freq: Every day | ORAL | Status: DC
  Administered 2022-09-03 – 2022-09-04 (×2): 25 mg via ORAL
  Filled 2022-09-03 (×2): qty 1

## 2022-09-03 MED ORDER — HEPARIN (PORCINE) 25000 UT/250ML-% IV SOLN: 900.0000 [IU]/h | INTRAVENOUS | Status: DC

## 2022-09-03 MED ORDER — HEPARIN (PORCINE) 25000 UT/250ML-% IV SOLN
1050.0000 [IU]/h | INTRAVENOUS | Status: DC
  Administered 2022-09-03: 1050 [IU]/h via INTRAVENOUS
  Filled 2022-09-03: qty 250

## 2022-09-03 MED ORDER — ACETAMINOPHEN 325 MG PO TABS
650.0000 mg | ORAL_TABLET | Freq: Four times a day (QID) | ORAL | Status: DC
  Administered 2022-09-03 – 2022-09-04 (×4): 650 mg via ORAL
  Filled 2022-09-03 (×4): qty 2

## 2022-09-03 MED ORDER — SENNOSIDES-DOCUSATE SODIUM 8.6-50 MG PO TABS: 1.0000 | ORAL_TABLET | Freq: Every evening | ORAL | Status: DC | PRN

## 2022-09-03 MED ORDER — METOPROLOL TARTRATE 50 MG PO TABS
50.0000 mg | ORAL_TABLET | Freq: Two times a day (BID) | ORAL | Status: DC
  Administered 2022-09-03 – 2022-09-05 (×6): 50 mg via ORAL
  Filled 2022-09-03 (×6): qty 1

## 2022-09-03 MED ORDER — MORPHINE SULFATE (PF) 2 MG/ML IV SOLN: 1.0000 mg | INTRAVENOUS | Status: DC | PRN

## 2022-09-03 MED ORDER — CEFDINIR 300 MG PO CAPS
300.0000 mg | ORAL_CAPSULE | Freq: Two times a day (BID) | ORAL | Status: DC
  Administered 2022-09-03 – 2022-09-04 (×3): 300 mg via ORAL
  Filled 2022-09-03 (×4): qty 1

## 2022-09-03 MED ORDER — SODIUM CHLORIDE 0.9 % IR SOLN
3000.0000 mL | Status: DC
  Administered 2022-09-03 – 2022-09-04 (×2): 3000 mL

## 2022-09-03 MED ORDER — TRAMADOL HCL 50 MG PO TABS: 50.0000 mg | ORAL_TABLET | ORAL | Status: DC | PRN

## 2022-09-03 MED ORDER — DIPHENHYDRAMINE HCL 50 MG/ML IJ SOLN: 12.5000 mg | Freq: Four times a day (QID) | INTRAMUSCULAR | Status: DC | PRN

## 2022-09-03 MED ORDER — LIDOCAINE 5 % EX PTCH
1.0000 | MEDICATED_PATCH | CUTANEOUS | Status: DC
  Administered 2022-09-03 – 2022-09-07 (×4): 1 via TRANSDERMAL
  Filled 2022-09-03 (×4): qty 1

## 2022-09-03 MED ORDER — TRAMADOL HCL 50 MG PO TABS
50.0000 mg | ORAL_TABLET | Freq: Four times a day (QID) | ORAL | Status: DC | PRN
  Administered 2022-09-03: 50 mg via ORAL
  Filled 2022-09-03: qty 1

## 2022-09-03 NOTE — Progress Notes (Signed)
D/w Dr. Edilia Bo of vascular and we will hold the aspirin and only continue Plavix monotherapy at this juncture when we are able to resume this from the perspective of cardiology Appreciate input

## 2022-09-03 NOTE — Progress Notes (Signed)
ANTICOAGULATION CONSULT NOTE - Follow Up Consult  Pharmacy Consult for Heparin drip > on hold Indication: atrial fibrillation  Allergies  Allergen Reactions   Adhesive [Tape] Other (See Comments)    Very sensitive skin, tears easily   Codeine Sulfate Itching and Other (See Comments)    Headaches    Hydrocodone-Acetaminophen Nausea And Vomiting and Other (See Comments)    Headaches    Wound Dressing Adhesive Other (See Comments)    Very sensitive skin, tears easily    Patient Measurements: Height: 6' (182.9 cm) Weight: 76.1 kg (167 lb 12.3 oz) IBW/kg (Calculated) : 77.6   Vital Signs: Temp: 97.8 F (36.6 C) (05/27 1541) Temp Source: Axillary (05/27 1541) BP: 127/78 (05/27 1541) Pulse Rate: 99 (05/27 1541)  Labs: Recent Labs    08/31/22 2020 08/31/22 2122 08/31/22 2215 09/01/22 0131 09/01/22 0344 09/01/22 2053 09/02/22 0034 09/02/22 0743 09/03/22 0125 09/03/22 1455  HGB 14.0   < >  --  11.8*  --   --  12.4*  --  12.5* 12.1*  HCT 40.0   < >  --  35.0*  --   --  36.1*  --  37.1* 35.8*  PLT 321  --   --  246  --   --  239  --  238 218  APTT 30  --   --   --   --    < > 60* 72* 78*  --   LABPROT 16.5*  --   --   --   --   --   --   --   --   --   INR 1.3*  --   --   --   --   --   --   --   --   --   HEPARINUNFRC  --   --   --   --   --   --  1.03*  --  0.89*  --   CREATININE 1.31*   < >  --  1.09  --   --  0.75  --  0.88  --   CKTOTAL 4,821*  --   --   --   --   --  1,956*  --  795*  --   TROPONINIHS 208*  --  210*  --  220*  --   --   --   --   --    < > = values in this interval not displayed.     Estimated Creatinine Clearance: 68.5 mL/min (by C-G formula based on SCr of 0.88 mg/dL).   Medical History: Past Medical History:  Diagnosis Date   ALLERGIC RHINITIS    Atrial flutter (HCC)    CAD (coronary artery disease)    CAD s/p CABG 1993 and redo 2016,   Carotid artery disease (HCC)    bilateral   Chronic combined systolic and diastolic CHF (congestive  heart failure) (HCC)    CKD (chronic kidney disease), stage II    COPD (chronic obstructive pulmonary disease) (HCC)    Diverticulosis    Hyperlipidemia    Osteoarthritis    PAF (paroxysmal atrial fibrillation) (HCC)    Peripheral neuropathy    Personal history of prostate cancer    PVD (peripheral vascular disease) with claudication (HCC)    Radiation proctitis    STEMI (ST elevation myocardial infarction) (HCC) 04/29/2014   PTCA [place] and CFX, in setting of VF/VT arrest and CGS   Tobacco use disorder, continuous  Ventral hernia       Assessment: 81yom with Hx Afib on apixaban pta.   Begin heparin drip for anticoagulation while apixaban on hold  Will monitor heparin drip rate with aptt as heparin level falsely elevated with apixaban use.   aPTT 78 sec is therapeutic on heparin drip 1050 units/hr, no bleeding noted cbc stable  Continue heparin until procedures complete  PM update: heparin was turned off this afternoon for hematuria x 6 hrs.  Right before it was supposed to resume pt pulled out foley and now with frank blood from penis.  Spoke to [facility] (E.Chen) and Cards (S.Weaver) - keep heparin off for now.  Goal of Therapy:  aPTT 66-100 seconds Heparin level 0.3-0.7  Monitor platelets by anticoagulation protocol: Yes   Plan:  Keep heparin off for now. F/u plans to resume.  Reece Leader, Colon Flattery, BCCP Clinical Pharmacist  09/03/2022 7:12 PM   [facility] pharmacy phone numbers are listed on [email or web address]

## 2022-09-03 NOTE — Progress Notes (Signed)
Speech Language Pathology Treatment: Dysphagia  Patient Details Name: [name] MRN: [id number] DOB: 06-06-1940 Today's Date: 09/03/2022 Time: 4098-1191 SLP Time Calculation (min) (ACUTE ONLY): 15 min  Assessment / Plan / Recommendation Clinical Impression  Pt still alert but perhaps a little less attentive and aware of surroundings today. Seems to be in quite a bit of pain. SLP repositioned  pt for am meal and assisted him with sips of nectar thick liquids and bites of oatmeal. Intake was minima. Pt appeared fatigued. He seemed to be swallowing air when drinking and also couldn't figure out how to blow his nose. He could only blow air out of his mouth, couldn't exhale through his nose. He claimed it was because it was stuffy, but he wasn't even getting air into his nasal passage to put against mucous. Tried several cues to assist, but pt wasn't successful. Will continue to follow. Plan to do MBS prior to d/c.   HPI HPI: The patient is an 82 year old white male with multiple medical problems who by report was found lying on the floor with his wife covered in stool and urine.  He was brought to [facility] and was worked up with a head CT which was unremarkable and a cervical CT which demonstrated a type II odontoid fracture. Patient takes care of his similarly aged wife who has severe dementia and his home is in a state of disrepair and apparently squalor according to the son who lives in [place].      SLP Plan  Continue with current plan of care      Recommendations for follow up therapy are one component of a multi-disciplinary discharge planning process, led by the attending physician.  Recommendations may be updated based on patient status, additional functional criteria and insurance authorization.    Recommendations  Diet recommendations: Nectar-thick liquid;Dysphagia 3 (mechanical soft) Liquids provided via: Straw Medication Administration: Whole meds with  puree Supervision: Staff to assist with self feeding Compensations: Slow rate;Small sips/bites Postural Changes and/or Swallow Maneuvers: Seated upright 90 degrees                      Frequent or constant Supervision/Assistance Dysphagia, oropharyngeal phase (R13.12)     Continue with current plan of care     Brycelynn Stampley, Riley Nearing  09/03/2022, 11:16 AM

## 2022-09-03 NOTE — NC FL2 (Signed)
[place] MEDICAID FL2 LEVEL OF CARE FORM     IDENTIFICATION  Patient Name: [name] Birthdate: 12-26-1940 Sex: male Admission Date (Current Location): 08/31/2022  [facility] and [place] Number:  Producer, television/film/video and Address:  [facility], [address] [place], [place] [address]      Provider Number: [id number]  Attending Physician Name and Address:  Rhetta Mura, MD  Relative Name and Phone Number:  Kateri Mc [phone]    Current Level of Care: Hospital Recommended Level of Care: Skilled Nursing Facility Prior Approval Number:    Date Approved/Denied:   PASRR Number: [id number]  Discharge Plan: SNF    Current Diagnoses: Patient Active Problem List   Diagnosis Date Noted   PAF (paroxysmal atrial fibrillation) (HCC) 09/02/2022   Chronic atrial fibrillation with RVR (HCC) 09/01/2022   Rhabdomyolysis 09/01/2022   AKI (acute kidney injury) (HCC) 09/01/2022   AMS (altered mental status) 09/01/2022   Abnormal LFTs 09/01/2022   Sepsis secondary to UTI (HCC) 09/01/2022   Sensorineural hearing loss 04/10/2022   Tinnitus 04/10/2022   Edema 12/20/2021   Cellulitis 12/20/2021   Odontoid fracture (HCC) 12/07/2021   Fall 12/07/2021   Atherosclerotic heart disease of native coronary artery with unspecified angina pectoris (HCC) 10/23/2021   Nicotine dependence 10/23/2021   Head trauma 10/23/2021   On anticoagulant therapy 10/23/2021   Injury of neck 10/23/2021   Vision abnormalities 10/23/2021   Fall from slipping on ice 05/03/2020   Acute hand eczema 11/16/2019   Impetigo 11/16/2019   Bursitis of left elbow 01/07/2019   Trigger finger 08/22/2017   Insomnia 12/19/2015   Prostate cancer (HCC) 12/19/2015   Other bursitis of elbow, right elbow 06/25/2015   Epiretinal membrane (ERM) of right eye 01/18/2015   Exudative macular degeneration (HCC) 01/18/2015   Pseudophakia of both eyes 01/18/2015   Hypokalemia 06/29/2014    PVD (peripheral vascular disease) with claudication (HCC)    COPD (chronic obstructive pulmonary disease) (HCC)    Hyponatremia    Nausea with vomiting    Orthostatic hypotension 06/22/2014   Dehydration 06/22/2014   Atrial flutter (HCC) 06/22/2014   Pain in joint, shoulder region 06/14/2014   Acute blood loss anemia 06/09/2014   Hypertensive heart disease 06/09/2014   Hematuria 05/31/2014   S/P CABG x 4 05/28/2014   Chronic anticoagulation-on Eliquis at discharge 05/11/14 05/25/2014   ICM-EF 45-50% by echo 04/30/14 05/25/2014   Protein-calorie malnutrition, severe (HCC) 05/25/2014   Encephalopathy 05/14/2014   Acute respiratory acidosis (HCC)    S/P CABG x 2 05/04/2014   Acute respiratory failure (HCC)    STEMI 05/04/14 with shock, CPR, CHB, VT 04/30/2014   Cardiogenic shock (HCC) 04/30/2014   Cardiac arrest (HCC) 04/30/2014   VT (ventricular tachycardia) (HCC) 04/30/2014   CHB (complete heart block) (HCC) 04/30/2014   CAD S/P LM and CFX PCI 05/04/14 04/30/2014   Wart 04/18/2014   Laceration of index finger 12/04/2012   Hand eczema 05/26/2012   Bruising 02/20/2012   Nuclear cataract 12/26/2011   Pseudophakia 12/26/2011   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   Actinic keratoses 11/14/2011   Constipation 07/18/2011   Sinusitis, acute 04/18/2011   Ventral hernia 01/03/2011   SWEATING 06/26/2010   History of transient ischemic attack (TIA)    CERUMEN IMPACTION 02/20/2010   DIVERTICULOSIS-COLON 11/01/2009   RECTAL BLEEDING 11/01/2009   ABDOMINAL BLOATING 11/01/2009   ABNORMAL BOWEL SOUNDS 11/01/2009   ABDOMINAL PAIN-RLQ 11/01/2009   ABDOMINAL PAIN, GENERALIZED 11/01/2009   Rash  and nonspecific skin eruption 07/11/2009   TRANSIENT ISCHEMIC ATTACK, HX OF 03/22/2009   BRONCHITIS, ACUTE 11/26/2008   Personal history of malignant neoplasm of prostate 08/02/2008   ALLERGIC RHINITIS 08/06/2007   ELEVATED PROSTATE SPECIFIC ANTIGEN 08/06/2007   Hyperlipidemia 04/22/2007   Neuropathic pain of  both legs 04/22/2007   UPPER RESPIRATORY INFECTION (URI) 04/22/2007   COPD exacerbation (HCC) 04/22/2007   Osteoarthritis 04/22/2007   LOW BACK PAIN 04/22/2007    Orientation RESPIRATION BLADDER Height & Weight     Self, Time, Situation, Place  Normal Continent (Urethral Catheter) Weight: 167 lb 12.3 oz (76.1 kg) Height:  6' (182.9 cm)  BEHAVIORAL SYMPTOMS/MOOD NEUROLOGICAL BOWEL NUTRITION STATUS      Continent Diet (Please see discharge summary)  AMBULATORY STATUS COMMUNICATION OF NEEDS Skin   Extensive Assist Verbally Other (Comment) (Blister,Foot,L,Ecchymosis,Bil.,Erythema,head,arm,hand,Bil.,Wound/Incision LDAs,PI coccyx,medial,unstageable,foam lift dressing,PRN,PI Ischial tuberosity,L,stage 1,foam lift dressing,PRN)                       Personal Care Assistance Level of Assistance  Bathing, Feeding, Dressing Bathing Assistance: Maximum assistance Feeding assistance: Limited assistance Dressing Assistance: Maximum assistance     Functional Limitations Info  Sight, Hearing, Speech Sight Info: Impaired Hearing Info: Adequate Speech Info: Adequate    SPECIAL CARE FACTORS FREQUENCY  PT (By licensed PT), OT (By licensed OT)     PT Frequency: 5x min weekly OT Frequency: 5x min weekly            Contractures Contractures Info: Not present    Additional Factors Info  Code Status, Allergies Code Status Info: FULL Allergies Info: Adhesive (tape),Codeine Sulfate,Hydrocodone-acetaminophen,Wound Dressing Adhesive           Current Medications (09/03/2022):  This is the current hospital active medication list Current Facility-Administered Medications  Medication Dose Route Frequency Provider Last Rate Last Admin   0.9 % NaCl with KCl 40 mEq / L  infusion   Intravenous Continuous Rhetta Mura, MD 100 mL/hr at 09/03/22 0808 New Bag at 09/03/22 5784   acetaminophen (TYLENOL) tablet 650 mg  650 mg Oral Q6H Rhetta Mura, MD       cefdinir (OMNICEF)  capsule 300 mg  300 mg Oral Q12H Rhetta Mura, MD   300 mg at 09/03/22 1224   Chlorhexidine Gluconate Cloth 2 % PADS 6 each  6 each Topical Daily Gertha Calkin, MD   6 each at 09/03/22 0700   diclofenac (FLECTOR) 1.3 % 1 patch  1 patch Transdermal BID Rhetta Mura, MD   1 patch at 09/03/22 0945   diphenhydrAMINE (BENADRYL) injection 12.5 mg  12.5 mg Intravenous Q6H PRN Cooper, Josseline P, PA-C       heparin ADULT infusion 100 units/mL (25000 units/278mL)  900 Units/hr Intravenous Continuous Samtani, Jai-Gurmukh, MD       hydrALAZINE (APRESOLINE) injection 5 mg  5 mg Intravenous Q4H PRN Gertha Calkin, MD       lidocaine (LIDODERM) 5 % 1 patch  1 patch Transdermal Q24H Cooper, Josseline P, PA-C       losartan (COZAAR) tablet 25 mg  25 mg Oral Daily Wendall Stade, MD   25 mg at 09/03/22 0942   magnesium oxide (MAG-OX) tablet 400 mg  400 mg Oral BID Rhetta Mura, MD   400 mg at 09/03/22 0942   metoprolol tartrate (LOPRESSOR) tablet 50 mg  50 mg Oral BID Wendall Stade, MD   50 mg at 09/03/22 0942   morphine (PF) 2 MG/ML injection  1 mg  1 mg Intravenous Q4H PRN Cooper, Josseline P, PA-C       nicotine (NICODERM CQ - dosed in mg/24 hours) patch 14 mg  14 mg Transdermal Daily Irena Cords V, MD   14 mg at 09/03/22 0944   sodium chloride flush (NS) 0.9 % injection 3 mL  3 mL Intravenous Q12H Irena Cords V, MD   3 mL at 09/03/22 0948   thiamine (VITAMIN B1) injection 100 mg  100 mg Intravenous Daily Gertha Calkin, MD   100 mg at 09/03/22 0942   traMADol (ULTRAM) tablet 50 mg  50 mg Oral Q4H PRN Excell Seltzer, Josseline P, PA-C         Discharge Medications: Please see discharge summary for a list of discharge medications.  Relevant Imaging Results:  Relevant Lab Results:   Additional Information [id number]  Delilah Shan, LCSWA

## 2022-09-03 NOTE — Progress Notes (Addendum)
PROGRESS NOTE   [name]  ZOX:[id number] DOB: [birth year] DOA: 08/31/2022 PCP: Tresa Garter, MD  Brief Narrative:  82 year old home dwelling male ICM EF 45-50% 2016+ CABG 1993, redo 2016 CABG X4 A flutter chronic anticoagulation Eliquis CHADVASC >3-previously on amiodarone PVD + stent L SFA/right SFA 2011 Prior heavy smoker Macular degeneration Chronic lumbago Patient allegedly had a fall last year at his home falling up the stairs hit his neck and had a 1 mm dens fracture that was nonoperable at the time  Brought to emergency room with complaint of fall found down on the kitchen floor lethargic confused covered in feces foul-smelling urine c-collar in place heart rate 120 and A-fib given 500 cc bolus Workup = BUNs/creatinine 35/1.3 (baseline 1.0), AST/ALT 104/51 baseline normal bilirubin 1.5 Troponin trend 208-220, lactic acid 2.3-3.6 Potassium 3.3 W BC 9.2, hemoglobin 11.8, platelet 246 CXR patchy airspace opacity retrocardiac LL base?  Pulmonary contusion?  Pneumonia--pelvic x-rays negative for fracture CT neck = chronic C2 dens fracture increase separation of fracture segments >3 mm, chronic T9 burst fracture CT head 6 mm left frontal scalp hematoma, 13 mm right parietal occipital scalp hematoma  Neurosurgery consulted by EDP Cardiology consulted by admitting physician regarding elevated troponin 5/26 EF 35-40% decreased function regional wall motion abnormalities noted additionally 5/27 palliative care consulted for goals of care, pain management assistance  Hospital-Problem based course  Severe sepsis on admission DDx urinary infection?  Aspiration-lactic acidosis 3.6 peak on admission  Comycin and cefepime transition to Omnicef to complete 7 days total stop date 5/30 [facility] + UC 5/24 [facility] SLP confirms need for dysphagia 3 diet  Elevated troponin demand ischemia/ICM EF 45-50% with redo CABG 2016 x 4--repeat echo as above IV heparin GTT---->Eliquis 5/27 Not an  interventional candidate Current meds metoprolol 50 twice daily, losartan 25 started 5/27 Outpatient follow-up to be scheduled with Dr. Clifton James  Upper back pain 6/10 likely secondary to burst fracture with worsening Will ask IR to evaluate for kyphoplasty given the area of pain is in his thoracic region of his spine Have asked palliative care to also evaluate for pain control options--- in the interim increasing tramadol 50-->100 every 6 as needed and will also schedule the Tylenol every 6 hours for 8 doses to see if this helps  Flutter/fib paroxysmal CHADVASC >3 Echo as above Metoprolol now increased as above  Hypokalemia Magnesium corrected continue Mag-Ox 400 twice daily, potassium corrected Saline lock IV 5/27 mag  Fall, rhabdomyolysis CK trending down Keep Foley-the patient failed voiding trial 5/26 and will require Foley outpatient  C2 dens fracture with increasing separation >3 mm Chronic T9 burst fracture D/w Dr. Cameron Ali do pt/ot as per Dr. Danie Chandler restrictions and wear collar and keep neck neutral Patient is a poor operative candidate for C2 dens fracture but can undergo therapy with collar in place  Left hip pain with external rotation at hip Plain pelvic x-rays were negative No further workup as CT of left hip is -5/25  PVD Chronic smoker Eventually resume asa/plavix?  Stage II decubitus ulcer present on admission with unstageable-follow wound care nursing instructions Bleb on left first hallux-watch for defervescent--- monitor and float heels-Prevalon boots ordered  Social Patient takes care of his similarly aged wife who has severe dementia and his home is in a state of disrepair and apparently squalor according to the son who lives in [place] Therapy to evaluate and I will discuss implications with the patient himself once he stabilizes little more  DVT prophylaxis:  IV heparin Code Status: Presumed full Family Communication: Discussed in detail at  the bedside with the patient's son [name] on 5/27 He has multiple comorbid illnesses including an impressive pneumonia, NSTEMI from the pneumonia, multiple falls, mild AKI and he seems like he is failing to thrive We will ask palliative care to consolidate goals of care-he may recover from this illness but long-term his prognosis is poor Disposition:  Status is: Inpatient Remains inpatient appropriate because:   Requires placement   Subjective:  Good discussion with patient today he seems more coherent- no chest pain but severe back pain-when I tried to sit him up he has severe discomfort in his back No fever no chills His heart rate remains elevated and cardiology is adjusting his meds given NSTEMI but no targets or candidacy for intervention given advanced frailty  Objective: Vitals:   09/03/22 0356 09/03/22 0400 09/03/22 0500 09/03/22 0807  BP: 135/76 136/79 131/78 125/89  Pulse: (!) 109 97 (!) 106 (!) 107  Resp: 17 18 18 17   Temp: 97.6 F (36.4 C)   98 F (36.7 C)  TempSrc: Oral   Oral  SpO2: 96% 100% 99% 90%  Weight:   76.1 kg   Height:        Intake/Output Summary (Last 24 hours) at 09/03/2022 0957 Last data filed at 09/03/2022 0500 Gross per 24 hour  Intake 1723.44 ml  Output 1100 ml  Net 623.44 ml    Filed Weights   09/01/22 0230 09/02/22 0425 09/03/22 0500  Weight: 73.7 kg 73.5 kg 76.1 kg    Examination:   More coherent pleasant no distress [place] collar in place Bilaterally mild rales posterior laterally S1-S2 tachycardic PVCs heart rates 100s to 120s Abdomen is soft no rebound Bleb on left first hallux Straight leg raise bilaterally equal Onychogryphosis noted Foley in place   Data Reviewed: personally reviewed   CBC    Component Value Date/Time   WBC 7.4 09/03/2022 0125   RBC 3.79 (L) 09/03/2022 0125   HGB 12.5 (L) 09/03/2022 0125   HGB 13.7 10/20/2019 1331   HGB 13.2 12/01/2008 1247   HCT 37.1 (L) 09/03/2022 0125   HCT 38.9 10/20/2019 1331    HCT 38.3 (L) 12/01/2008 1247   PLT 238 09/03/2022 0125   PLT 231 10/20/2019 1331   MCV 97.9 09/03/2022 0125   MCV 97 10/20/2019 1331   MCV 99.9 (H) 12/01/2008 1247   MCH 33.0 09/03/2022 0125   MCHC 33.7 09/03/2022 0125   RDW 12.6 09/03/2022 0125   RDW 11.9 10/20/2019 1331   RDW 12.7 12/01/2008 1247   LYMPHSABS 0.3 (L) 08/31/2022 2020   LYMPHSABS 0.7 (L) 12/01/2008 1247   MONOABS 0.9 08/31/2022 2020   MONOABS 0.8 12/01/2008 1247   EOSABS 0.0 08/31/2022 2020   EOSABS 0.5 12/01/2008 1247   BASOSABS 0.0 08/31/2022 2020   BASOSABS 0.0 12/01/2008 1247      Latest Ref Rng & Units 09/03/2022    1:25 AM 09/02/2022   12:34 AM 09/01/2022    1:31 AM  CMP  Glucose 70 - 99 mg/dL 96  409  811   BUN 8 - 23 mg/dL 23  27  31    Creatinine 0.61 - 1.24 mg/dL 9.14  7.82  9.56   Sodium 135 - 145 mmol/L 141  142  141   Potassium 3.5 - 5.1 mmol/L 4.1  3.1  3.2   Chloride 98 - 111 mmol/L 109  107  107   CO2 22 - 32  mmol/L 24  24  22    Calcium 8.9 - 10.3 mg/dL 9.0  8.8  8.8   Total Protein 6.5 - 8.1 g/dL   4.5   Total Bilirubin 0.3 - 1.2 mg/dL   1.3   Alkaline Phos 38 - 126 U/L   43   AST 15 - 41 U/L   93   ALT 0 - 44 U/L   43      Radiology Studies: ECHOCARDIOGRAM COMPLETE  Result Date: 09/02/2022    ECHOCARDIOGRAM REPORT   Patient Name:   YOCHANAN DEMPEWOLF Date of Exam: 09/02/2022 Medical Rec #:  [id number]            Height:       72.0 in Accession #:    [id number]           Weight:       162.0 lb Date of Birth:  Jul 18, 1940            BSA:          1.948 m Patient Age:    81 years             BP:           124/91 mmHg Patient Gender: M                    HR:           88 bpm. Exam Location:  Inpatient Procedure: 2D Echo, Cardiac Doppler and Color Doppler Indications:     Congestive heart failure  History:         Patient has prior history of Echocardiogram examinations, most                  recent 06/24/2014. CHF, CAD, COPD and TIA, Arrythmias:Atrial                  Fibrillation; Risk  Factors:Current Smoker.  Sonographer:     Lucy Antigua Referring Phys:  [id number] Rhetta Mura Diagnosing Phys: Thurmon Fair MD IMPRESSIONS  1. Left ventricular ejection fraction, by estimation, is 35 to 40%. The left ventricle has moderately decreased function. The left ventricle demonstrates regional wall motion abnormalities (see scoring diagram/findings for description). Left ventricular  diastolic function could not be evaluated.  2. Right ventricular systolic function is mildly reduced. The right ventricular size is normal. There is normal pulmonary artery systolic pressure.  3. Left atrial size was moderately dilated.  4. Right atrial size was mildly dilated.  5. The mitral valve is normal in structure. Mild to moderate mitral valve regurgitation.  6. The aortic valve is tricuspid. There is mild calcification of the aortic valve. There is mild thickening of the aortic valve. Aortic valve regurgitation is not visualized. Aortic valve sclerosis/calcification is present, without any evidence of aortic stenosis.  7. The inferior vena cava is dilated in size with >50% respiratory variability, suggesting right atrial pressure of 8 mmHg. Comparison(s): Prior images reviewed side by side. The left ventricular function is worsened. The left ventricular wall motion abnormalities are worse. There is evidence of interval infarction in the distribution of the right coronary artery and distal left circumflex artery. FINDINGS  Left Ventricle: Left ventricular ejection fraction, by estimation, is 35 to 40%. The left ventricle has moderately decreased function. The left ventricle demonstrates regional wall motion abnormalities. The left ventricular internal cavity size was normal in size. There is no left ventricular hypertrophy. Left ventricular diastolic function could not be evaluated  due to atrial fibrillation. Left ventricular diastolic function could not be evaluated.  LV Wall Scoring: The inferior wall and  posterior wall are akinetic. The antero-lateral wall and basal inferoseptal segment are hypokinetic. The entire anterior wall, entire anterior septum, entire apex, and mid inferoseptal segment are normal. Right Ventricle: The right ventricular size is normal. Right vetricular wall thickness was not well visualized. Right ventricular systolic function is mildly reduced. There is normal pulmonary artery systolic pressure. The tricuspid regurgitant velocity is 1.82 m/s, and with an assumed right atrial pressure of 8 mmHg, the estimated right ventricular systolic pressure is 21.2 mmHg. Left Atrium: Left atrial size was moderately dilated. Right Atrium: Right atrial size was mildly dilated. Pericardium: There is no evidence of pericardial effusion. Mitral Valve: The mitral valve is normal in structure. Mild to moderate mitral valve regurgitation. Tricuspid Valve: The tricuspid valve is normal in structure. Tricuspid valve regurgitation is trivial. Aortic Valve: The aortic valve is tricuspid. There is mild calcification of the aortic valve. There is mild thickening of the aortic valve. Aortic valve regurgitation is not visualized. Aortic valve sclerosis/calcification is present, without any evidence of aortic stenosis. Aortic valve mean gradient measures 3.0 mmHg. Aortic valve peak gradient measures 6.0 mmHg. Aortic valve area, by VTI measures 1.68 cm. Pulmonic Valve: The pulmonic valve was grossly normal. Pulmonic valve regurgitation is not visualized. Aorta: The aortic root and ascending aorta are structurally normal, with no evidence of dilitation. Venous: The inferior vena cava is dilated in size with greater than 50% respiratory variability, suggesting right atrial pressure of 8 mmHg. IAS/Shunts: No atrial level shunt detected by color flow Doppler.  LEFT VENTRICLE PLAX 2D LVIDd:         4.60 cm   Diastology LVIDs:         4.20 cm   LV e' medial:    7.83 cm/s LV PW:         1.10 cm   LV E/e' medial:  10.6 LV IVS:         1.00 cm   LV e' lateral:   7.94 cm/s LVOT diam:     1.80 cm   LV E/e' lateral: 10.4 LV SV:         34 LV SV Index:   18 LVOT Area:     2.54 cm  RIGHT VENTRICLE RV S prime:     5.77 cm/s TAPSE (M-mode): 2.1 cm LEFT ATRIUM             Index        RIGHT ATRIUM           Index LA diam:        4.41 cm 2.26 cm/m   RA Area:     18.70 cm LA Vol (A2C):   47.3 ml 24.28 ml/m  RA Volume:   50.80 ml  26.07 ml/m LA Vol (A4C):   61.8 ml 31.72 ml/m LA Biplane Vol: 55.4 ml 28.44 ml/m  AORTIC VALVE AV Area (Vmax):    1.48 cm AV Area (Vmean):   1.73 cm AV Area (VTI):     1.68 cm AV Vmax:           122.00 cm/s AV Vmean:          74.500 cm/s AV VTI:            0.203 m AV Peak Grad:      6.0 mmHg AV Mean Grad:      3.0 mmHg LVOT Vmax:  71.10 cm/s LVOT Vmean:        50.700 cm/s LVOT VTI:          0.134 m LVOT/AV VTI ratio: 0.66  AORTA Ao Root diam: 2.80 cm Ao Asc diam:  3.70 cm MITRAL VALVE               TRICUSPID VALVE MV Area (PHT): 4.46 cm    TR Peak grad:   13.2 mmHg MV Decel Time: 170 msec    TR Vmax:        182.00 cm/s MV E velocity: 82.70 cm/s                            SHUNTS                            Systemic VTI:  0.13 m                            Systemic Diam: 1.80 cm Rachelle Hora Croitoru MD Electronically signed by Thurmon Fair MD Signature Date/Time: 09/02/2022/10:26:59 AM    Final (Updated)    CT HIP LEFT WO CONTRAST  Result Date: 09/01/2022 CLINICAL DATA:  Left hip pain after fall. EXAM: CT OF THE LEFT HIP WITHOUT CONTRAST TECHNIQUE: Multidetector CT imaging of the left hip was performed according to the standard protocol. Multiplanar CT image reconstructions were also generated. RADIATION DOSE REDUCTION: This exam was performed according to the departmental dose-optimization program which includes automated exposure control, adjustment of the mA and/or kV according to patient size and/or use of iterative reconstruction technique. COMPARISON:  CT abdomen pelvis from yesterday. FINDINGS:  Bones/Joint/Cartilage No fracture or dislocation. Mild left hip osteoarthritis. Trace left hip joint effusion. Ligaments Ligaments are suboptimally evaluated by CT. Muscles and Tendons Grossly intact. Soft tissue No fluid collection or hematoma. No soft tissue mass. Bladder decompressed by Foley catheter. IMPRESSION: 1. No acute osseous abnormality. 2. Mild left hip osteoarthritis. Electronically Signed   By: Obie Dredge M.D.   On: 09/01/2022 16:10     Scheduled Meds:  acetaminophen  650 mg Oral Q6H   cefdinir  300 mg Oral Q12H   Chlorhexidine Gluconate Cloth  6 each Topical Daily   diclofenac  1 patch Transdermal BID   losartan  25 mg Oral Daily   magnesium oxide  400 mg Oral BID   metoprolol tartrate  50 mg Oral BID   nicotine  14 mg Transdermal Daily   sodium chloride flush  3 mL Intravenous Q12H   thiamine (VITAMIN B1) injection  100 mg Intravenous Daily   Continuous Infusions:  0.9 % NaCl with KCl 40 mEq / L 100 mL/hr at 09/03/22 0808     LOS: 2 days   Time spent: 55 minutes  Rhetta Mura, MD [facility] To contact the attending provider between 7A-7P or the covering provider during after hours 7P-7A, please log into the web site [email or web address] and access using universal [place] password for that web site. If you do not have the password, please call the hospital operator.  09/03/2022, 9:57 AM

## 2022-09-03 NOTE — Progress Notes (Signed)
Subjective:  Requiring pain meds for generalized/neck pain  Objective:  Vitals:   09/03/22 0300 09/03/22 0356 09/03/22 0400 09/03/22 0500  BP: (!) 142/78 135/76 136/79 131/78  Pulse: (!) 102 (!) 109 97 (!) 106  Resp: 16 17 18 18   Temp:  97.6 F (36.4 C)    TempSrc:  Oral    SpO2: 98% 96% 100% 99%  Weight:    76.1 kg  Height:        Intake/Output from previous day:  Intake/Output Summary (Last 24 hours) at 09/03/2022 0746 Last data filed at 09/03/2022 0500 Gross per 24 hour  Intake 1723.44 ml  Output 1100 ml  Net 623.44 ml    Physical Exam: Chronically ill male Poor dentition Cervical collar on  No murmur  Abdomen benign No edema Sacral decubitus  Lab Results: Basic Metabolic Panel: Recent Labs    09/02/22 0034 09/02/22 0743 09/02/22 1025 09/03/22 0125  NA 142  --   --  141  K 3.1*  --   --  4.1  CL 107  --   --  109  CO2 24  --   --  24  GLUCOSE 101*  --   --  96  BUN 27*  --   --  23  CREATININE 0.75  --   --  0.88  CALCIUM 8.8*  --   --  9.0  MG  --  2.1 2.2  --    Liver Function Tests: Recent Labs    08/31/22 2020 09/01/22 0131  AST 104* 93*  ALT 51* 43  ALKPHOS 55 43  BILITOT 1.5* 1.3*  PROT 5.6* 4.5*  ALBUMIN 3.0* 2.4*   No results for input(s): "LIPASE", "AMYLASE" in the last 72 hours. CBC: Recent Labs    08/31/22 2020 08/31/22 2122 09/02/22 0034 09/03/22 0125  WBC 9.2   < > 10.6* 7.4  NEUTROABS 8.0*  --   --   --   HGB 14.0   < > 12.4* 12.5*  HCT 40.0   < > 36.1* 37.1*  MCV 96.9   < > 98.1 97.9  PLT 321   < > 239 238   < > = values in this interval not displayed.   Cardiac Enzymes: Recent Labs    08/31/22 2020 09/02/22 0034 09/03/22 0125  CKTOTAL 4,821* 1,956* 795*   Thyroid Function Tests: Recent Labs    09/01/22 0131  TSH 0.445    Imaging: ECHOCARDIOGRAM COMPLETE  Result Date: 09/02/2022    ECHOCARDIOGRAM REPORT   Patient Name:   [name] Date of Exam: 09/02/2022 Medical Rec #:  [id number]             Height:       72.0 in Accession #:    [id number]           Weight:       162.0 lb Date of Birth:  October 08, 1940            BSA:          1.948 m Patient Age:    81 years             BP:           124/91 mmHg Patient Gender: M                    HR:           88 bpm. Exam Location:  Inpatient Procedure: 2D Echo, Cardiac  Doppler and Color Doppler Indications:     Congestive heart failure  History:         Patient has prior history of Echocardiogram examinations, most                  recent 06/24/2014. CHF, CAD, COPD and TIA, Arrythmias:Atrial                  Fibrillation; Risk Factors:Current Smoker.  Sonographer:     Lucy Antigua Referring Phys:  [id number] Rhetta Mura Diagnosing Phys: Thurmon Fair MD IMPRESSIONS  1. Left ventricular ejection fraction, by estimation, is 35 to 40%. The left ventricle has moderately decreased function. The left ventricle demonstrates regional wall motion abnormalities (see scoring diagram/findings for description). Left ventricular  diastolic function could not be evaluated.  2. Right ventricular systolic function is mildly reduced. The right ventricular size is normal. There is normal pulmonary artery systolic pressure.  3. Left atrial size was moderately dilated.  4. Right atrial size was mildly dilated.  5. The mitral valve is normal in structure. Mild to moderate mitral valve regurgitation.  6. The aortic valve is tricuspid. There is mild calcification of the aortic valve. There is mild thickening of the aortic valve. Aortic valve regurgitation is not visualized. Aortic valve sclerosis/calcification is present, without any evidence of aortic stenosis.  7. The inferior vena cava is dilated in size with >50% respiratory variability, suggesting right atrial pressure of 8 mmHg. Comparison(s): Prior images reviewed side by side. The left ventricular function is worsened. The left ventricular wall motion abnormalities are worse. There is evidence of interval infarction in the  distribution of the right coronary artery and distal left circumflex artery. FINDINGS  Left Ventricle: Left ventricular ejection fraction, by estimation, is 35 to 40%. The left ventricle has moderately decreased function. The left ventricle demonstrates regional wall motion abnormalities. The left ventricular internal cavity size was normal in size. There is no left ventricular hypertrophy. Left ventricular diastolic function could not be evaluated due to atrial fibrillation. Left ventricular diastolic function could not be evaluated.  LV Wall Scoring: The inferior wall and posterior wall are akinetic. The antero-lateral wall and basal inferoseptal segment are hypokinetic. The entire anterior wall, entire anterior septum, entire apex, and mid inferoseptal segment are normal. Right Ventricle: The right ventricular size is normal. Right vetricular wall thickness was not well visualized. Right ventricular systolic function is mildly reduced. There is normal pulmonary artery systolic pressure. The tricuspid regurgitant velocity is 1.82 m/s, and with an assumed right atrial pressure of 8 mmHg, the estimated right ventricular systolic pressure is 21.2 mmHg. Left Atrium: Left atrial size was moderately dilated. Right Atrium: Right atrial size was mildly dilated. Pericardium: There is no evidence of pericardial effusion. Mitral Valve: The mitral valve is normal in structure. Mild to moderate mitral valve regurgitation. Tricuspid Valve: The tricuspid valve is normal in structure. Tricuspid valve regurgitation is trivial. Aortic Valve: The aortic valve is tricuspid. There is mild calcification of the aortic valve. There is mild thickening of the aortic valve. Aortic valve regurgitation is not visualized. Aortic valve sclerosis/calcification is present, without any evidence of aortic stenosis. Aortic valve mean gradient measures 3.0 mmHg. Aortic valve peak gradient measures 6.0 mmHg. Aortic valve area, by VTI measures 1.68  cm. Pulmonic Valve: The pulmonic valve was grossly normal. Pulmonic valve regurgitation is not visualized. Aorta: The aortic root and ascending aorta are structurally normal, with no evidence of dilitation. Venous: The inferior vena cava is  dilated in size with greater than 50% respiratory variability, suggesting right atrial pressure of 8 mmHg. IAS/Shunts: No atrial level shunt detected by color flow Doppler.  LEFT VENTRICLE PLAX 2D LVIDd:         4.60 cm   Diastology LVIDs:         4.20 cm   LV e' medial:    7.83 cm/s LV PW:         1.10 cm   LV E/e' medial:  10.6 LV IVS:        1.00 cm   LV e' lateral:   7.94 cm/s LVOT diam:     1.80 cm   LV E/e' lateral: 10.4 LV SV:         34 LV SV Index:   18 LVOT Area:     2.54 cm  RIGHT VENTRICLE RV S prime:     5.77 cm/s TAPSE (M-mode): 2.1 cm LEFT ATRIUM             Index        RIGHT ATRIUM           Index LA diam:        4.41 cm 2.26 cm/m   RA Area:     18.70 cm LA Vol (A2C):   47.3 ml 24.28 ml/m  RA Volume:   50.80 ml  26.07 ml/m LA Vol (A4C):   61.8 ml 31.72 ml/m LA Biplane Vol: 55.4 ml 28.44 ml/m  AORTIC VALVE AV Area (Vmax):    1.48 cm AV Area (Vmean):   1.73 cm AV Area (VTI):     1.68 cm AV Vmax:           122.00 cm/s AV Vmean:          74.500 cm/s AV VTI:            0.203 m AV Peak Grad:      6.0 mmHg AV Mean Grad:      3.0 mmHg LVOT Vmax:         71.10 cm/s LVOT Vmean:        50.700 cm/s LVOT VTI:          0.134 m LVOT/AV VTI ratio: 0.66  AORTA Ao Root diam: 2.80 cm Ao Asc diam:  3.70 cm MITRAL VALVE               TRICUSPID VALVE MV Area (PHT): 4.46 cm    TR Peak grad:   13.2 mmHg MV Decel Time: 170 msec    TR Vmax:        182.00 cm/s MV E velocity: 82.70 cm/s                            SHUNTS                            Systemic VTI:  0.13 m                            Systemic Diam: 1.80 cm Rachelle Hora Croitoru MD Electronically signed by Thurmon Fair MD Signature Date/Time: 09/02/2022/10:26:59 AM    Final (Updated)    CT HIP LEFT WO CONTRAST  Result  Date: 09/01/2022 CLINICAL DATA:  Left hip pain after fall. EXAM: CT OF THE LEFT HIP WITHOUT CONTRAST TECHNIQUE: Multidetector CT imaging of the left hip was performed  according to the standard protocol. Multiplanar CT image reconstructions were also generated. RADIATION DOSE REDUCTION: This exam was performed according to the departmental dose-optimization program which includes automated exposure control, adjustment of the mA and/or kV according to patient size and/or use of iterative reconstruction technique. COMPARISON:  CT abdomen pelvis from yesterday. FINDINGS: Bones/Joint/Cartilage No fracture or dislocation. Mild left hip osteoarthritis. Trace left hip joint effusion. Ligaments Ligaments are suboptimally evaluated by CT. Muscles and Tendons Grossly intact. Soft tissue No fluid collection or hematoma. No soft tissue mass. Bladder decompressed by Foley catheter. IMPRESSION: 1. No acute osseous abnormality. 2. Mild left hip osteoarthritis. Electronically Signed   By: Obie Dredge M.D.   On: 09/01/2022 16:10    Cardiac Studies:  ECG: afib no acute ST changes    Telemetry: afib rates 100-110   Echo: EF 45% inferior wall motion   Medications:    Chlorhexidine Gluconate Cloth  6 each Topical Daily   diclofenac  1 patch Transdermal BID   magnesium oxide  400 mg Oral BID   metoprolol succinate  100 mg Oral Daily   nicotine  14 mg Transdermal Daily   sodium chloride flush  3 mL Intravenous Q12H   thiamine (VITAMIN B1) injection  100 mg Intravenous Daily      0.9 % NaCl with KCl 40 mEq / L 100 mL/hr at 09/02/22 2114   ceFEPime (MAXIPIME) IV 2 g (09/02/22 2116)   heparin 1,050 Units/hr (09/03/22 0400)   vancomycin 750 mg (09/02/22 2236)    Assessment/Plan:   Elevated Troponin:  history of CABG x 2 no mention of chest pain ECG non acute would observe Troponin only in 200 range Doubt that IMI was acute Add low dose ACE and change to BID lopressor  PAF:  start heparin pending issues with C  spine, mental status and rhabdomyolysis  resume home eliquis  C spine:  chronic C2 dens fracture with more separation post presumed fall and scalp hematoma Neuro surgery consult indicates low likely to heal but will be followed as outpatient  Pneumonia: likely aspiration left lung base on vanc   Will arrange outpatient f/u with Dr Clifton James  Social situation at home bad He indicated wife Is also in hospital but not sure if this is true    Charlton Haws 09/03/2022, 7:46 AM

## 2022-09-03 NOTE — Consult Note (Addendum)
WOC Nurse Consult Note: Reason for Consult: Pressure injuries sustained during unwitnessed fall at home. Patient found "down" for an undetermined period of time in urine and stool. See photo provided to EMR by EDP. Wound type: Pressure, irritant contact dermatitis  ICD-10 CM Codes for Irritant Dermatitis  L24A2 - Due to fecal, urinary or dual incontinence L24A9 - Due to friction or contact with other specified body fluids L30.4  - Erythema intertrigo. Also used for abrasion of the hand, chafing of the skin, dermatitis due to sweating and friction, friction dermatitis, friction eczema, and genital/thigh intertrigo.   Pressure Injury POA: Yes Measurement: Left buttock DTPI: 2cm x 4cm purple/maroon discoloration. No exudate, no wound bed.   Right buttock Stage 2 PI: 3.5cm x 4.2cm x 0.1cm, Pink, moist, scant serous exudate Wound bed:As described above Drainage (amount, consistency, odor) As described above Periwound: skin peeling noted to buttocks consistent with friction related skin loss. Irritant contact dermatitis to perineal area, scrotum and lower buttocks.  Dressing procedure/placement/frequency: Turning and repositioning is in place; I have added guidance to minimize time in the supine position. Heel boots are provided for use when in bed. A pressure redistribution chair pad is provided for use when OOB in chair and is to be sent with patient at discharge for use in the next care setting. Patient has an indwelling urinary catheter and is pulling at it during the time of my assessment. He is incontinent of stool. Guidance is provided for use of our house skin care products (no rinse, pH balanced skin cleanser, clear moisture barrier cream) daily and PRN soiling.  Topical care to the pressure injuries will be to cleanse daily and PRN soiling and cover with folded layers of an antimicrobial nonadherent gauze (xeroform). This is to be covered with dry gauze and secured with silicone foam  dressings. The dressing is to be placed with the "tip" pointing away from the anus.  WOC nursing team will not follow, but will remain available to this patient, the nursing and medical teams.  Please re-consult if needed.  Thank you for inviting [place] to participate in this patient's Plan of Care.  Ladona Mow, MSN, RN, CNS, GNP, Leda Min, [employer], [employer] phone:  [phone]

## 2022-09-03 NOTE — Progress Notes (Deleted)
Consultation Note Date: 09/03/2022   Patient Name: [name]  DOB: 04-13-1940  MRN: [id number]  Age / Sex: 82 y.o., male  PCP: Plotnikov, [name], MD Referring Physician: Rhetta Mura, MD  Reason for Consultation: Establishing goals of care and Pain control  HPI/Patient Profile: 82 y.o. male  with past medical history of A-Fib on Eliquis, COPD, CAD s/p CABG x 4, PVD with bilateral stents, TIA, macular degeneration, stage II decubitus ulcer admitted on 08/31/2022 after found down at home after fall with altered mental status.   Patient is admitted with acute metabolic encephalopathy, A-fib with RVR, AKI, nontraumatic rhabdomyolysis, worsening odontoid fracture, and sepsis likely secondary to UTI. PMT has been consulted to assist with acute on chronic pain management of fracture as well as goals of care conversation.  Clinical Assessment and Goals of Care:  I have reviewed medical records including EPIC notes, labs and imaging, received report from RN, assessed the patient and then met at the bedside with patient's son Dukeand daughter-in-law to discuss diagnosis prognosis, GOC, EOL wishes, disposition and options.  I introduced Palliative Medicine as specialized medical care for people living with serious illness. It focuses on providing relief from the symptoms and stress of a serious illness. The goal is to improve quality of life for both the patient and the family.  We discussed a brief life review of the patient and then focused on their current illness.   I attempted to elicit values and goals of care important to the patient.    Medical History Review and Understanding:  Patient's son has a good understanding severity of patient's illness.  Patient's mental status is still fluctuating with agitation due to pain.  Social History: Patient lives at home where he cares for his wife who has  dementia.  Son reports that patient has adamantly rejected any assistance family has offered.  Patient has 3 sons and the closest is 2, who lives in [place].  His other sons live in [place] and [place].  Functional and Nutritional State: Patient's son reports patient has been cognitively declining since January. He reports physical/functional decline for the past year, since his fall a year ago that resulted in his initial C2 fracture. Functional decline has progressed even faster over the past 2 months.  Palliative Symptoms: Neck pain, bilateral shoulder pain  Discussion: Emotional support and therapeutic listening was provided as patient's son and daughter-in-law shared details about the difficult time they have had trying to convince patient to allow them to assist him.  His sole purpose in life is to take care of his wife and he is going to be very upset when he realizes that she is going to SNF after her discharge in the coming day or 2.  They also feel like it would be asked if he could go to the same SNF; they hope that both patient and his wife could go to a facility closer to [place] so they can check on them more frequently.  Patient's son shares the nonverbal signs of pain that he is witnessed, as patient has not made any mention of his pain to the nurse.  We discussed options for pain management including nonpharmacological interventions, Tylenol, tramadol, and IV morphine as needed.  Patient's son confirms that they would be interested in possible kyphoplasty for further relief as well.  They are hopeful that patient can participate in goals of care discussions when he is less agitated by his pain.  We discussed that if pain cannot  be adequately controlled and his quality of life continues to decline, that he may be appropriate for hospice.  They verbalized her understanding. Patient is conversive today, though unable to tell me details about any pain medicines he has tried in the  past.  He states his pain is currently a 9 out of 10.  I briefly discussed with him the importance of planning for the worst while hoping for the best and he agrees with this concept.   Discussed the importance of continued conversation with family and the medical providers regarding overall plan of care and treatment options, ensuring decisions are within the context of the patient's values and GOCs.   Questions and concerns were addressed.  Hard Choices booklet left for review. The family was encouraged to call with questions or concerns.  PMT will continue to support holistically.   SUMMARY OF RECOMMENDATIONS   -Continue full code/full scope treatment for now -Ongoing GOC discussions as patient is able once pain is better controlled -Ordered K pad and lidocaine pain for topical pain relief  -Increased frequency of tramadol 50 mg p.o. to every 4 hours as needed -Ordered IV morphine 1 mg every 4 hours as needed for severe pain uncontrolled by p.o. meds -Spoke with wife's RN on 5N to attempt to coordinate a visit to the patient -Psychosocial and emotional support provided -PMT will continue to follow and support  Prognosis:  Unable to determine  Discharge Planning: To Be Determined      Primary Diagnoses: Present on Admission:  COPD (chronic obstructive pulmonary disease) (HCC)  PVD (peripheral vascular disease) with claudication (HCC)  Nicotine dependence  ICM-EF 45-50% by echo 04/30/14  Odontoid fracture (HCC)  Chronic atrial fibrillation with RVR (HCC)  Rhabdomyolysis  AKI (acute kidney injury) (HCC)  AMS (altered mental status)  Abnormal LFTs  Sepsis secondary to UTI [facility])  Physical Exam Vitals and nursing note reviewed.  Constitutional:      General: He is not in acute distress.    Appearance: He is ill-appearing.  Cardiovascular:     Rate and Rhythm: Normal rate.  Pulmonary:     Effort: Pulmonary effort is normal.  Skin:    General: Skin is warm and dry.   Neurological:     Mental Status: He is alert.    Vital Signs: BP 127/65 (BP Location: Left Arm)   Pulse 93   Temp 97.9 F (36.6 C) (Oral)   Resp 19   Ht 6' (1.829 m)   Wt 76.1 kg   SpO2 92%   BMI 22.75 kg/m  Pain Scale: 0-10 POSS *See Group Information*: 1-Acceptable,Awake and alert Pain Score: 9    SpO2: SpO2: 92 % O2 Device:SpO2: 92 % O2 Flow Rate: .    Palliative Assessment/Data: tbd     MDM: high   Jisella Ashenfelter Jeni Salles, PA-C  Palliative Medicine Team Team phone # [phone]  Thank you for allowing the Palliative Medicine Team to assist in the care of this patient. Please utilize secure chat with additional questions, if there is no response within 30 minutes please call the above phone number.  Palliative Medicine Team providers are available by phone from 7am to 7pm daily and can be reached through the team cell phone.  Should this patient require assistance outside of these hours, please call the patient's attending physician.

## 2022-09-03 NOTE — Progress Notes (Addendum)
ANTICOAGULATION CONSULT NOTE - Follow Up Consult  Pharmacy Consult for Heparin drip Indication: atrial fibrillation  Allergies  Allergen Reactions   Adhesive [Tape] Other (See Comments)    Takes skin off    Codeine Sulfate Itching and Other (See Comments)    headaches   Hydrocodone    Hydrocodone-Acetaminophen Other (See Comments)   Other Other (See Comments)    Other reaction(s): NAUSEA,VOMITING, Headache   Wound Dressing Adhesive     Other reaction(s): Skin irritation    Patient Measurements: Height: 6' (182.9 cm) Weight: 76.1 kg (167 lb 12.3 oz) IBW/kg (Calculated) : 77.6   Vital Signs: Temp: 97.9 F (36.6 C) (05/27 1041) Temp Source: Oral (05/27 1041) BP: 114/64 (05/27 1041) Pulse Rate: 97 (05/27 1041)  Labs: Recent Labs    08/31/22 2020 08/31/22 2122 08/31/22 2215 09/01/22 0131 09/01/22 0344 09/01/22 2053 09/02/22 0034 09/02/22 0743 09/03/22 0125  HGB 14.0   < >  --  11.8*  --   --  12.4*  --  12.5*  HCT 40.0   < >  --  35.0*  --   --  36.1*  --  37.1*  PLT 321  --   --  246  --   --  239  --  238  APTT 30  --   --   --   --    < > 60* 72* 78*  LABPROT 16.5*  --   --   --   --   --   --   --   --   INR 1.3*  --   --   --   --   --   --   --   --   HEPARINUNFRC  --   --   --   --   --   --  1.03*  --  0.89*  CREATININE 1.31*   < >  --  1.09  --   --  0.75  --  0.88  CKTOTAL 4,821*  --   --   --   --   --  1,956*  --  795*  TROPONINIHS 208*  --  210*  --  220*  --   --   --   --    < > = values in this interval not displayed.     Estimated Creatinine Clearance: 68.5 mL/min (by C-G formula based on SCr of 0.88 mg/dL).   Medical History: Past Medical History:  Diagnosis Date   ALLERGIC RHINITIS    Atrial flutter (HCC)    CAD (coronary artery disease)    CAD s/p CABG 1993 and redo 2016,   Carotid artery disease (HCC)    bilateral   Chronic combined systolic and diastolic CHF (congestive heart failure) (HCC)    CKD (chronic kidney disease), stage  II    COPD (chronic obstructive pulmonary disease) (HCC)    Diverticulosis    Hyperlipidemia    Osteoarthritis    PAF (paroxysmal atrial fibrillation) (HCC)    Peripheral neuropathy    Personal history of prostate cancer    PVD (peripheral vascular disease) with claudication (HCC)    Radiation proctitis    STEMI (ST elevation myocardial infarction) (HCC) 04/29/2014   PTCA [place] and CFX, in setting of VF/VT arrest and CGS   Tobacco use disorder, continuous    Ventral hernia       Assessment: 81yom with Hx Afib on apixaban pta.   Begin heparin drip for anticoagulation while apixaban  on hold  Will monitor heparin drip rate with aptt as heparin level falsely elevated with apixaban use.   aPTT 78 sec is therapeutic on heparin drip 1050 units/hr, no bleeding noted cbc stable  Continue heparin until procedures complete  Goal of Therapy:  aPTT 66-100 seconds Heparin level 0.3-0.7  Monitor platelets by anticoagulation protocol: Yes   Plan:  Continue Heparin drip 1050 uts/hr  Daily aptt, heparin level and cbc  Montior s/s bleeding Resume po anticoagulation when patient more stable and procedures complete    Leota Sauers Pharm.D. CPP, BCPS Clinical Pharmacist [phone] 09/03/2022 11:17 AM    Please check AMION for all [facility] phone numbers After 10:00 PM, call Main Pharmacy [phone]   Addendum Called by RN this afternoon > patient developed gross hematuria in foley bag  H/h stable pltc stable 200s - no distress or overt bleeding other sites  Patient remains in Afib 90-100 on metoprolol Discussed with cards - will hold heparin for 6hrs and monitor urine Will resume heparin this evening at a lower rate 900 uts/hr   Leota Sauers Pharm.D. CPP, BCPS Clinical Pharmacist [phone] 09/03/2022 1:45 PM

## 2022-09-03 NOTE — TOC Initial Note (Addendum)
Transition of Care [facility]) - Initial/Assessment Note    Patient Details  Name: [name] MRN: [id number] Date of Birth: Aug 05, 1940  Transition of Care [facility]) CM/SW Contact:    Delilah Shan, LCSWA Phone Number: 09/03/2022, 1:57 PM  Clinical Narrative:                  CSW received consult for possible SNF placement at time of discharge. CSW spoke with patient regarding PT recommendation of SNF placement at time of discharge. Patient reports PTA he comes from home with spouse. Patient expressed understanding of PT recommendation and is agreeable to SNF placement at time of discharge. Patient gave CSW permission to fax out for SNF. CSW discussed insurance authorization process with patient.  No further questions reported at this time. CSW Madilyn Fireman making APS report due to conditions of patients home. (Per EMS run sheet).CSW to continue to follow and assist with discharge planning needs.    Expected Discharge Plan: Skilled Nursing Facility Barriers to Discharge: Continued Medical Work up   Patient Goals and CMS Choice Patient states their goals for this hospitalization and ongoing recovery are:: SNF CMS Medicare.gov Compare Post Acute Care list provided to:: Patient Choice offered to / list presented to : Patient      Expected Discharge Plan and Services In-house Referral: Clinical Social Work     Living arrangements for the past 2 months: Single Family Home                                      Prior Living Arrangements/Services Living arrangements for the past 2 months: Single Family Home Lives with:: Spouse Patient language and need for interpreter reviewed:: Yes Do you feel safe going back to the place where you live?: No   SNF  Need for Family Participation in Patient Care: Yes (Comment) Care giver support system in place?: Yes (comment)   Criminal Activity/Legal Involvement Pertinent to Current Situation/Hospitalization: No - Comment as  needed  Activities of Daily Living      Permission Sought/Granted Permission sought to share information with : Case Manager, Family Supports, Magazine features editor Permission granted to share information with : Yes, Verbal Permission Granted  Share Information with NAME: [name]  Permission granted to share info w AGENCY: SNF  Permission granted to share info w Relationship: Son  Permission granted to share info w Contact Information: Kateri Mc [phone]  Emotional Assessment Appearance:: Appears stated age Attitude/Demeanor/Rapport: Gracious Affect (typically observed): Calm Orientation: : Oriented to Self, Oriented to Place, Oriented to  Time, Oriented to Situation Alcohol / Substance Use: Not Applicable Psych Involvement: No (comment)  Admission diagnosis:  Acute kidney injury (HCC) [N17.9] Sepsis secondary to UTI (HCC) [A41.9, N39.0] Non-traumatic rhabdomyolysis [M62.82] Closed odontoid fracture, initial encounter (HCC) [S12.100A] AMS (altered mental status) [R41.82] Patient Active Problem List   Diagnosis Date Noted   PAF (paroxysmal atrial fibrillation) (HCC) 09/02/2022   Chronic atrial fibrillation with RVR (HCC) 09/01/2022   Rhabdomyolysis 09/01/2022   AKI (acute kidney injury) (HCC) 09/01/2022   AMS (altered mental status) 09/01/2022   Abnormal LFTs 09/01/2022   Sepsis secondary to UTI (HCC) 09/01/2022   Sensorineural hearing loss 04/10/2022   Tinnitus 04/10/2022   Edema 12/20/2021   Cellulitis 12/20/2021   Odontoid fracture (HCC) 12/07/2021   Fall 12/07/2021   Atherosclerotic heart disease of native coronary artery with unspecified angina pectoris (HCC) 10/23/2021   Nicotine dependence  10/23/2021   Head trauma 10/23/2021   On anticoagulant therapy 10/23/2021   Injury of neck 10/23/2021   Vision abnormalities 10/23/2021   Fall from slipping on ice 05/03/2020   Acute hand eczema 11/16/2019   Impetigo 11/16/2019   Bursitis of left elbow 01/07/2019    Trigger finger 08/22/2017   Insomnia 12/19/2015   Prostate cancer (HCC) 12/19/2015   Other bursitis of elbow, right elbow 06/25/2015   Epiretinal membrane (ERM) of right eye 01/18/2015   Exudative macular degeneration (HCC) 01/18/2015   Pseudophakia of both eyes 01/18/2015   Hypokalemia 06/29/2014   PVD (peripheral vascular disease) with claudication (HCC)    COPD (chronic obstructive pulmonary disease) (HCC)    Hyponatremia    Nausea with vomiting    Orthostatic hypotension 06/22/2014   Dehydration 06/22/2014   Atrial flutter (HCC) 06/22/2014   Pain in joint, shoulder region 06/14/2014   Acute blood loss anemia 06/09/2014   Hypertensive heart disease 06/09/2014   Hematuria 05/31/2014   S/P CABG x 4 05/28/2014   Chronic anticoagulation-on Eliquis at discharge 05/11/14 05/25/2014   ICM-EF 45-50% by echo 04/30/14 05/25/2014   Protein-calorie malnutrition, severe (HCC) 05/25/2014   Encephalopathy 05/14/2014   Acute respiratory acidosis (HCC)    S/P CABG x 2 05/04/2014   Acute respiratory failure (HCC)    STEMI 05/04/14 with shock, CPR, CHB, VT 04/30/2014   Cardiogenic shock (HCC) 04/30/2014   Cardiac arrest (HCC) 04/30/2014   VT (ventricular tachycardia) (HCC) 04/30/2014   CHB (complete heart block) (HCC) 04/30/2014   CAD S/P LM and CFX PCI 05/04/14 04/30/2014   Wart 04/18/2014   Laceration of index finger 12/04/2012   Hand eczema 05/26/2012   Bruising 02/20/2012   Nuclear cataract 12/26/2011   Pseudophakia 12/26/2011   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   Actinic keratoses 11/14/2011   Constipation 07/18/2011   Sinusitis, acute 04/18/2011   Ventral hernia 01/03/2011   SWEATING 06/26/2010   History of transient ischemic attack (TIA)    CERUMEN IMPACTION 02/20/2010   DIVERTICULOSIS-COLON 11/01/2009   RECTAL BLEEDING 11/01/2009   ABDOMINAL BLOATING 11/01/2009   ABNORMAL BOWEL SOUNDS 11/01/2009   ABDOMINAL PAIN-RLQ 11/01/2009   ABDOMINAL PAIN, GENERALIZED 11/01/2009   Rash and  nonspecific skin eruption 07/11/2009   TRANSIENT ISCHEMIC ATTACK, HX OF 03/22/2009   BRONCHITIS, ACUTE 11/26/2008   Personal history of malignant neoplasm of prostate 08/02/2008   ALLERGIC RHINITIS 08/06/2007   ELEVATED PROSTATE SPECIFIC ANTIGEN 08/06/2007   Hyperlipidemia 04/22/2007   Neuropathic pain of both legs 04/22/2007   UPPER RESPIRATORY INFECTION (URI) 04/22/2007   COPD exacerbation (HCC) 04/22/2007   Osteoarthritis 04/22/2007   LOW BACK PAIN 04/22/2007   PCP:  Tresa Garter, MD Pharmacy:   [facility] - [address], [place] - [address] [place] [address] Phone: [phone] Fax: [phone]  [facility] [address] [place] - [address] AT [facility] [address] [place] [address] Phone: [phone] Fax: [phone]     Social Determinants of Health (SDOH) Social History: SDOH Screenings   Food Insecurity: No Food Insecurity (12/05/2021)  Housing: Low Risk  (12/05/2021)  Transportation Needs: No Transportation Needs (12/05/2021)  Alcohol Screen: Low Risk  (12/05/2021)  Depression (PHQ2-9): Low Risk  (04/10/2022)  Financial Resource Strain: Low Risk  (12/05/2021)  Physical Activity: Sufficiently Active (12/05/2021)  Social Connections: Socially Integrated (12/05/2021)  Stress: No Stress Concern Present (12/05/2021)  Tobacco Use: High Risk (08/31/2022)   SDOH Interventions:  Readmission Risk Interventions     No data to display

## 2022-09-04 ENCOUNTER — Inpatient Hospital Stay (HOSPITAL_COMMUNITY): Payer: Medicare Other

## 2022-09-04 DIAGNOSIS — E440 Moderate protein-calorie malnutrition: Secondary | ICD-10-CM | POA: Diagnosis present

## 2022-09-04 DIAGNOSIS — R400 Somnolence: Secondary | ICD-10-CM | POA: Diagnosis not present

## 2022-09-04 DIAGNOSIS — S12100S Unspecified displaced fracture of second cervical vertebra, sequela: Secondary | ICD-10-CM

## 2022-09-04 DIAGNOSIS — R7989 Other specified abnormal findings of blood chemistry: Secondary | ICD-10-CM | POA: Diagnosis not present

## 2022-09-04 DIAGNOSIS — M6282 Rhabdomyolysis: Secondary | ICD-10-CM | POA: Diagnosis not present

## 2022-09-04 DIAGNOSIS — Z7189 Other specified counseling: Secondary | ICD-10-CM | POA: Diagnosis not present

## 2022-09-04 LAB — CBC
HCT: 41 % (ref 39.0–52.0)
Hemoglobin: 13.8 g/dL (ref 13.0–17.0)
MCH: 33.7 pg (ref 26.0–34.0)
MCHC: 33.7 g/dL (ref 30.0–36.0)
MCV: 100.2 fL — ABNORMAL HIGH (ref 80.0–100.0)
Platelets: 278 10*3/uL (ref 150–400)
RBC: 4.09 MIL/uL — ABNORMAL LOW (ref 4.22–5.81)
RDW: 12.6 % (ref 11.5–15.5)
WBC: 12.6 10*3/uL — ABNORMAL HIGH (ref 4.0–10.5)
nRBC: 0 % (ref 0.0–0.2)

## 2022-09-04 LAB — CULTURE, BLOOD (ROUTINE X 2)
Culture: NO GROWTH
Special Requests: ADEQUATE

## 2022-09-04 LAB — BASIC METABOLIC PANEL
Anion gap: 8 (ref 5–15)
BUN: 23 mg/dL (ref 8–23)
CO2: 20 mmol/L — ABNORMAL LOW (ref 22–32)
Calcium: 8.9 mg/dL (ref 8.9–10.3)
Chloride: 113 mmol/L — ABNORMAL HIGH (ref 98–111)
Creatinine, Ser: 0.9 mg/dL (ref 0.61–1.24)
GFR, Estimated: >60 mL/min (ref >60)
Glucose, Bld: 144 mg/dL — ABNORMAL HIGH (ref 70–99)
Potassium: 5.2 mmol/L — ABNORMAL HIGH (ref 3.5–5.1)
Sodium: 141 mmol/L (ref 135–145)

## 2022-09-04 LAB — CK: Total CK: 394 U/L (ref 49–397)

## 2022-09-04 MED ORDER — ACETAMINOPHEN 325 MG PO TABS
650.0000 mg | ORAL_TABLET | Freq: Four times a day (QID) | ORAL | Status: DC
  Administered 2022-09-04 – 2022-09-05 (×4): 650 mg via ORAL
  Filled 2022-09-04 (×4): qty 2

## 2022-09-04 MED ORDER — ADULT MULTIVITAMIN W/MINERALS CH
1.0000 | ORAL_TABLET | Freq: Every day | ORAL | Status: DC
  Administered 2022-09-05: 1 via ORAL
  Filled 2022-09-04: qty 1

## 2022-09-04 MED ORDER — ORAL CARE MOUTH RINSE: 15.0000 mL | OROMUCOSAL | Status: DC | PRN

## 2022-09-04 MED ORDER — TRAMADOL HCL 50 MG PO TABS
50.0000 mg | ORAL_TABLET | Freq: Two times a day (BID) | ORAL | Status: DC
  Administered 2022-09-04: 50 mg via ORAL
  Filled 2022-09-04: qty 1

## 2022-09-04 MED ORDER — ACETAMINOPHEN 650 MG RE SUPP
975.0000 mg | Freq: Once | RECTAL | Status: AC
  Administered 2022-09-04: 650 mg via RECTAL
  Filled 2022-09-04: qty 2

## 2022-09-04 MED ORDER — LOSARTAN POTASSIUM 25 MG PO TABS: 12.5000 mg | ORAL_TABLET | Freq: Every day | ORAL | Status: DC

## 2022-09-04 MED ORDER — SODIUM CHLORIDE 0.9 % IV SOLN
2.0000 g | Freq: Three times a day (TID) | INTRAVENOUS | Status: DC
  Administered 2022-09-04 – 2022-09-05 (×2): 2 g via INTRAVENOUS
  Filled 2022-09-04 (×2): qty 12.5

## 2022-09-04 MED ORDER — SODIUM CHLORIDE 0.9 % IV SOLN
INTRAVENOUS | Status: DC
  Filled 2022-09-04 (×2): qty 500

## 2022-09-04 MED ORDER — TRAMADOL HCL 50 MG PO TABS: 50.0000 mg | ORAL_TABLET | Freq: Two times a day (BID) | ORAL | Status: DC | PRN

## 2022-09-04 NOTE — Progress Notes (Signed)
Pharmacy Antibiotic Note  [name] is a 82 y.o. male admitted on 08/31/2022 with pneumonia.  Pharmacy has been consulted for cefepime dosing.  Plan: Cefepime 2g IV q 8 hrs F/u cultures, renal function and clinical course.  Height: 6' (182.9 cm) Weight: 75.5 kg (166 lb 7.2 oz) IBW/kg (Calculated) : 77.6  Temp (24hrs), Avg:97.8 F (36.6 C), Min:97.4 F (36.3 C), Max:98 F (36.7 C)  Recent Labs  Lab 08/31/22 2020 08/31/22 2122 08/31/22 2245 09/01/22 0131 09/01/22 0344 09/02/22 0034 09/03/22 0125 09/03/22 1455 09/04/22 0015  WBC 9.2  --   --  10.1  --  10.6* 7.4 8.6 12.6*  CREATININE 1.31* 1.00  --  1.09  --  0.75 0.88  --  0.90  LATICACIDVEN 2.3*  --  2.6* 3.6* 1.9  --   --   --   --     Estimated Creatinine Clearance: 66.4 mL/min (by C-G formula based on SCr of 0.9 mg/dL).    Allergies  Allergen Reactions   Adhesive [Tape] Other (See Comments)    Very sensitive skin, tears easily   Codeine Sulfate Itching and Other (See Comments)    Headaches    Hydrocodone-Acetaminophen Nausea And Vomiting and Other (See Comments)    Headaches    Wound Dressing Adhesive Other (See Comments)    Very sensitive skin, tears easily    Antimicrobials this admission: Cefdinir 5/27>5/28 Cefepime 5/28>  Dose adjustments this admission:  Microbiology results: 5/24 BCx > GPR 1 bottle 5/25 BCx > neg  Thank you for allowing pharmacy to be a part of this patient's care.  Reece Leader, Colon Flattery, BCCP Clinical Pharmacist  09/04/2022 7:07 PM   [facility] pharmacy phone numbers are listed on [email or web address]

## 2022-09-04 NOTE — Progress Notes (Signed)
Daily Progress Note   Patient Name: [name]       Date: 09/04/2022 DOB: 1940/09/27  Age: 82 y.o. MRN#: [id number] Attending Physician: Rhetta Mura, MD Primary Care Physician: Tresa Garter, MD Admit Date: 08/31/2022  Reason for Consultation/Follow-up: Establishing goals of care and Pain control  Patient Profile/HPI:  82 y.o. male  with past medical history of A-Fib on Eliquis, COPD, CAD s/p CABG x 4, PVD with bilateral stents, TIA, macular degeneration, stage II decubitus ulcer admitted on 08/31/2022 after found down at home after fall with altered mental status.    Patient is admitted with acute metabolic encephalopathy, A-fib with RVR, AKI, nontraumatic rhabdomyolysis, worsening odontoid fracture, and sepsis likely secondary to UTI. PMT has been consulted to assist with acute on chronic pain management of fracture as well as goals of care conversation.  Subjective: Chart reviewed including labs, progress notes, imaging from this and previous encounters.  Evaluated patient.  Discussed with nursing team.  Patient constantly in pain, has not received prns.  TOC working on SNF placement with spouse who is also hospitalized.  Patient lethargic on my eval, didn't answer any of my questions (name, pain?).  Per nursing he was able to say his name and location earlier but also said it was 1945.  Appears to be intermittently aspirating, on precautions per SLP.   Review of Systems  Unable to perform ROS: Mental status change     Physical Exam Vitals and nursing note reviewed.  Constitutional:      General: He is not in acute distress.    Appearance: He is ill-appearing.  Cardiovascular:     Rate and Rhythm: Normal rate.  Neurological:     Comments: sleepy              Vital Signs: BP 125/75 (BP Location: Left Arm)   Pulse 94   Temp 97.8 F (36.6 C) (Axillary)   Resp 17   Ht 6' (1.829 m)   Wt 75.5 kg   SpO2 93%   BMI 22.57 kg/m  SpO2: SpO2: 93 % O2 Device: O2 Device: Room Air O2 Flow Rate: O2 Flow Rate (L/min): 2 L/min  Intake/output summary:  Intake/Output Summary (Last 24 hours) at 09/04/2022 1312 Last data filed at 09/04/2022 1119 Gross per 24 hour  Intake 3130 ml  Output 40981 ml  Net -7170 ml   LBM: Last BM Date : 09/04/22 Baseline Weight: Weight: 77.1 kg Most recent weight: Weight: 75.5 kg       Palliative Assessment/Data: PPS: 30%       Patient Active Problem List   Diagnosis Date Noted   PAF (paroxysmal atrial fibrillation) (HCC) 09/02/2022   Chronic atrial fibrillation with RVR (HCC) 09/01/2022   Rhabdomyolysis 09/01/2022   AKI (acute kidney injury) (HCC) 09/01/2022   AMS (altered mental status) 09/01/2022   Abnormal LFTs 09/01/2022   Sepsis secondary to UTI (HCC) 09/01/2022   Sensorineural hearing loss 04/10/2022   Tinnitus 04/10/2022   Edema 12/20/2021   Cellulitis 12/20/2021   Odontoid fracture (HCC) 12/07/2021   Fall 12/07/2021   Atherosclerotic heart disease of native coronary artery with unspecified angina pectoris (HCC) 10/23/2021   Nicotine dependence 10/23/2021   Head trauma 10/23/2021   On anticoagulant therapy 10/23/2021   Injury of neck 10/23/2021   Vision abnormalities 10/23/2021   Fall from slipping on ice 05/03/2020   Acute hand eczema 11/16/2019   Impetigo 11/16/2019   Bursitis of left elbow 01/07/2019   Trigger finger 08/22/2017   Insomnia 12/19/2015   Prostate cancer (HCC) 12/19/2015   Other bursitis of elbow, right elbow 06/25/2015   Epiretinal membrane (ERM) of right eye 01/18/2015   Exudative macular degeneration (HCC) 01/18/2015   Pseudophakia of both eyes 01/18/2015   Hypokalemia 06/29/2014   PVD (peripheral vascular disease) with claudication (HCC)    COPD (chronic obstructive  pulmonary disease) (HCC)    Hyponatremia    Nausea with vomiting    Orthostatic hypotension 06/22/2014   Dehydration 06/22/2014   Atrial flutter (HCC) 06/22/2014   Pain in joint, shoulder region 06/14/2014   Acute blood loss anemia 06/09/2014   Hypertensive heart disease 06/09/2014   Hematuria 05/31/2014   S/P CABG x 4 05/28/2014   Chronic anticoagulation-on Eliquis at discharge 05/11/14 05/25/2014   ICM-EF 45-50% by echo 04/30/14 05/25/2014   Protein-calorie malnutrition, severe (HCC) 05/25/2014   Encephalopathy 05/14/2014   Acute respiratory acidosis (HCC)    S/P CABG x 2 05/04/2014   Acute respiratory failure (HCC)    STEMI 05/04/14 with shock, CPR, CHB, VT 04/30/2014   Cardiogenic shock (HCC) 04/30/2014   Cardiac arrest (HCC) 04/30/2014   VT (ventricular tachycardia) (HCC) 04/30/2014   CHB (complete heart block) (HCC) 04/30/2014   CAD S/P LM and CFX PCI 05/04/14 04/30/2014   Wart 04/18/2014   Laceration of index finger 12/04/2012   Hand eczema 05/26/2012   Bruising 02/20/2012   Nuclear cataract 12/26/2011   Pseudophakia 12/26/2011   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   Actinic keratoses 11/14/2011   Constipation 07/18/2011   Sinusitis, acute 04/18/2011   Ventral hernia 01/03/2011   SWEATING 06/26/2010   History of transient ischemic attack (TIA)    CERUMEN IMPACTION 02/20/2010   DIVERTICULOSIS-COLON 11/01/2009   RECTAL BLEEDING 11/01/2009   ABDOMINAL BLOATING 11/01/2009   ABNORMAL BOWEL SOUNDS 11/01/2009   ABDOMINAL PAIN-RLQ 11/01/2009   ABDOMINAL PAIN, GENERALIZED 11/01/2009   Rash and nonspecific skin eruption 07/11/2009   TRANSIENT ISCHEMIC ATTACK, HX OF 03/22/2009   BRONCHITIS, ACUTE 11/26/2008   Personal history of malignant neoplasm of prostate 08/02/2008   ALLERGIC RHINITIS 08/06/2007   ELEVATED PROSTATE SPECIFIC ANTIGEN 08/06/2007   Hyperlipidemia 04/22/2007   Neuropathic pain of both legs 04/22/2007   UPPER RESPIRATORY INFECTION (URI) 04/22/2007   COPD exacerbation  (HCC) 04/22/2007   Osteoarthritis 04/22/2007   LOW BACK PAIN 04/22/2007  Palliative Care Assessment & Plan    Assessment/Recommendations/Plan  Continue current care Will schedule tramadol 50mg  po bid for pain Attempted to call patient's son [name]- no answer   Code Status: Full code  Prognosis:  Unable to determine  Thank you for allowing the Palliative Medicine Team to assist in the care of this patient.  Greater than 50%  of this time was spent counseling and coordinating care related to the above assessment and plan.  Ocie Bob, AGNP-C Palliative Medicine   Please contact Palliative Medicine Team phone at [phone] for questions and concerns.

## 2022-09-04 NOTE — Progress Notes (Signed)
Subjective:  Requiring pain meds for generalized/neck pain At least more alert with glasses on this am   Objective:  Vitals:   09/04/22 0400 09/04/22 0500 09/04/22 0600 09/04/22 0737  BP:    (!) 137/90  Pulse: 80 94 94 96  Resp: (!) 29 (!) 32 13 15  Temp:    (!) 97.4 F (36.3 C)  TempSrc:    Axillary  SpO2: 94% 97% 97%   Weight:      Height:        Intake/Output from previous day:  Intake/Output Summary (Last 24 hours) at 09/04/2022 0848 Last data filed at 09/04/2022 0740 Gross per 24 hour  Intake 3120 ml  Output 7550 ml  Net -4430 ml    Physical Exam: Chronically ill male Poor dentition Cervical collar on  No murmur  Abdomen benign No edema Sacral decubitus  Lab Results: Basic Metabolic Panel: Recent Labs    09/02/22 0743 09/02/22 1025 09/03/22 0125 09/04/22 0015  NA  --   --  141 141  K  --   --  4.1 5.2*  CL  --   --  109 113*  CO2  --   --  24 20*  GLUCOSE  --   --  96 144*  BUN  --   --  23 23  CREATININE  --   --  0.88 0.90  CALCIUM  --   --  9.0 8.9  MG 2.1 2.2  --   --    Liver Function Tests: No results for input(s): "AST", "ALT", "ALKPHOS", "BILITOT", "PROT", "ALBUMIN" in the last 72 hours.  No results for input(s): "LIPASE", "AMYLASE" in the last 72 hours. CBC: Recent Labs    09/03/22 1455 09/04/22 0015  WBC 8.6 12.6*  NEUTROABS 6.7  --   HGB 12.1* 13.8  HCT 35.8* 41.0  MCV 97.5 100.2*  PLT 218 278   Cardiac Enzymes: Recent Labs    09/02/22 0034 09/03/22 0125 09/04/22 0015  CKTOTAL 1,956* 795* 394   Thyroid Function Tests: No results for input(s): "TSH", "T4TOTAL", "T3FREE", "THYROIDAB" in the last 72 hours.  Invalid input(s): "FREET3"   Imaging: DG CHEST PORT 1 VIEW  Result Date: 09/04/2022 CLINICAL DATA:  [id number] with shortness of breath. EXAM: PORTABLE CHEST 1 VIEW COMPARISON:  Chest CT and portable chest both 08/31/2022 FINDINGS: 3:17 a.m. Multiple overlying monitor wires. There are hazy opacities increased in  the left lower lung field which could be atelectasis, pneumonia or combination. Remainder of the lungs are clear. There is mild cardiomegaly with sternotomy and CABG changes without evidence of CHF or pleural collections. Degenerative change thoracic spine. IMPRESSION: 1. Increased hazy opacities in the left lower lung field which could be atelectasis, pneumonia or combination. 2. Mild cardiomegaly with sternotomy and CABG changes. Electronically Signed   By: Almira Bar M.D.   On: 09/04/2022 03:27   ECHOCARDIOGRAM COMPLETE  Result Date: 09/02/2022    ECHOCARDIOGRAM REPORT   Patient Name:   [name] Date of Exam: 09/02/2022 Medical Rec #:  [id number]            Height:       72.0 in Accession #:    [id number]           Weight:       162.0 lb Date of Birth:  1941/03/23            BSA:          1.948 m Patient  Age:    82 years             BP:           124/91 mmHg Patient Gender: M                    HR:           88 bpm. Exam Location:  Inpatient Procedure: 2D Echo, Cardiac Doppler and Color Doppler Indications:     Congestive heart failure  History:         Patient has prior history of Echocardiogram examinations, most                  recent 06/24/2014. CHF, CAD, COPD and TIA, Arrythmias:Atrial                  Fibrillation; Risk Factors:Current Smoker.  Sonographer:     Lucy Antigua Referring Phys:  [id number] Rhetta Mura Diagnosing Phys: Thurmon Fair MD IMPRESSIONS  1. Left ventricular ejection fraction, by estimation, is 35 to 40%. The left ventricle has moderately decreased function. The left ventricle demonstrates regional wall motion abnormalities (see scoring diagram/findings for description). Left ventricular  diastolic function could not be evaluated.  2. Right ventricular systolic function is mildly reduced. The right ventricular size is normal. There is normal pulmonary artery systolic pressure.  3. Left atrial size was moderately dilated.  4. Right atrial size was mildly dilated.  5.  The mitral valve is normal in structure. Mild to moderate mitral valve regurgitation.  6. The aortic valve is tricuspid. There is mild calcification of the aortic valve. There is mild thickening of the aortic valve. Aortic valve regurgitation is not visualized. Aortic valve sclerosis/calcification is present, without any evidence of aortic stenosis.  7. The inferior vena cava is dilated in size with >50% respiratory variability, suggesting right atrial pressure of 8 mmHg. Comparison(s): Prior images reviewed side by side. The left ventricular function is worsened. The left ventricular wall motion abnormalities are worse. There is evidence of interval infarction in the distribution of the right coronary artery and distal left circumflex artery. FINDINGS  Left Ventricle: Left ventricular ejection fraction, by estimation, is 35 to 40%. The left ventricle has moderately decreased function. The left ventricle demonstrates regional wall motion abnormalities. The left ventricular internal cavity size was normal in size. There is no left ventricular hypertrophy. Left ventricular diastolic function could not be evaluated due to atrial fibrillation. Left ventricular diastolic function could not be evaluated.  LV Wall Scoring: The inferior wall and posterior wall are akinetic. The antero-lateral wall and basal inferoseptal segment are hypokinetic. The entire anterior wall, entire anterior septum, entire apex, and mid inferoseptal segment are normal. Right Ventricle: The right ventricular size is normal. Right vetricular wall thickness was not well visualized. Right ventricular systolic function is mildly reduced. There is normal pulmonary artery systolic pressure. The tricuspid regurgitant velocity is 1.82 m/s, and with an assumed right atrial pressure of 8 mmHg, the estimated right ventricular systolic pressure is 21.2 mmHg. Left Atrium: Left atrial size was moderately dilated. Right Atrium: Right atrial size was mildly  dilated. Pericardium: There is no evidence of pericardial effusion. Mitral Valve: The mitral valve is normal in structure. Mild to moderate mitral valve regurgitation. Tricuspid Valve: The tricuspid valve is normal in structure. Tricuspid valve regurgitation is trivial. Aortic Valve: The aortic valve is tricuspid. There is mild calcification of the aortic valve. There is mild thickening of the aortic valve.  Aortic valve regurgitation is not visualized. Aortic valve sclerosis/calcification is present, without any evidence of aortic stenosis. Aortic valve mean gradient measures 3.0 mmHg. Aortic valve peak gradient measures 6.0 mmHg. Aortic valve area, by VTI measures 1.68 cm. Pulmonic Valve: The pulmonic valve was grossly normal. Pulmonic valve regurgitation is not visualized. Aorta: The aortic root and ascending aorta are structurally normal, with no evidence of dilitation. Venous: The inferior vena cava is dilated in size with greater than 50% respiratory variability, suggesting right atrial pressure of 8 mmHg. IAS/Shunts: No atrial level shunt detected by color flow Doppler.  LEFT VENTRICLE PLAX 2D LVIDd:         4.60 cm   Diastology LVIDs:         4.20 cm   LV e' medial:    7.83 cm/s LV PW:         1.10 cm   LV E/e' medial:  10.6 LV IVS:        1.00 cm   LV e' lateral:   7.94 cm/s LVOT diam:     1.80 cm   LV E/e' lateral: 10.4 LV SV:         34 LV SV Index:   18 LVOT Area:     2.54 cm  RIGHT VENTRICLE RV S prime:     5.77 cm/s TAPSE (M-mode): 2.1 cm LEFT ATRIUM             Index        RIGHT ATRIUM           Index LA diam:        4.41 cm 2.26 cm/m   RA Area:     18.70 cm LA Vol (A2C):   47.3 ml 24.28 ml/m  RA Volume:   50.80 ml  26.07 ml/m LA Vol (A4C):   61.8 ml 31.72 ml/m LA Biplane Vol: 55.4 ml 28.44 ml/m  AORTIC VALVE AV Area (Vmax):    1.48 cm AV Area (Vmean):   1.73 cm AV Area (VTI):     1.68 cm AV Vmax:           122.00 cm/s AV Vmean:          74.500 cm/s AV VTI:            0.203 m AV Peak Grad:       6.0 mmHg AV Mean Grad:      3.0 mmHg LVOT Vmax:         71.10 cm/s LVOT Vmean:        50.700 cm/s LVOT VTI:          0.134 m LVOT/AV VTI ratio: 0.66  AORTA Ao Root diam: 2.80 cm Ao Asc diam:  3.70 cm MITRAL VALVE               TRICUSPID VALVE MV Area (PHT): 4.46 cm    TR Peak grad:   13.2 mmHg MV Decel Time: 170 msec    TR Vmax:        182.00 cm/s MV E velocity: 82.70 cm/s                            SHUNTS                            Systemic VTI:  0.13 m  Systemic Diam: 1.80 cm Mihai Croitoru MD Electronically signed by Thurmon Fair MD Signature Date/Time: 09/02/2022/10:26:59 AM    Final (Updated)     Cardiac Studies:  ECG: afib no acute ST changes    Telemetry: afib rates 100-110   Echo: EF 45% inferior wall motion   Medications:    acetaminophen  650 mg Oral Q6H   cefdinir  300 mg Oral Q12H   Chlorhexidine Gluconate Cloth  6 each Topical Daily   diclofenac  1 patch Transdermal BID   lidocaine  1 patch Transdermal Q24H   losartan  25 mg Oral Daily   magnesium oxide  400 mg Oral BID   metoprolol tartrate  50 mg Oral BID   nicotine  14 mg Transdermal Daily   sodium chloride flush  3 mL Intravenous Q12H   thiamine (VITAMIN B1) injection  100 mg Intravenous Daily      sodium chloride 75 mL/hr at 09/04/22 0509   sodium chloride irrigation      Assessment/Plan:   Elevated Troponin:  history of CABG x 2 no mention of chest pain ECG non acute would observe Troponin only in 200 range Doubt that IMI was acute Add low dose ACE and change to BID lopressor  PAF:  start heparin pending issues with C spine, mental status and rhabdomyolysis  resume home eliquis  C spine:  chronic C2 dens fracture with more separation post presumed fall and scalp hematoma Neuro surgery consult indicates low likely to heal but will be followed as outpatient  Pneumonia: likely aspiration left lung base on vanc   Will arrange outpatient f/u with Dr Clifton James  Social situation at home  bad He indicated wife Is also in hospital but not sure if this is true    [name] 09/04/2022, 8:48 AM

## 2022-09-04 NOTE — TOC Progression Note (Addendum)
Transition of Care [facility]) - Progression Note    Patient Details  Name: [name] MRN: [id number] Date of Birth: 11-May-1940  [facility] CM/SW Contact  Carley Hammed, LCSW Phone Number: 09/04/2022, 11:44 AM  Clinical Narrative:    CSW was notified that pt's spouse is on 5N and family would like for them to go to the same facility. CSW coordinated with spouses Child psychotherapist and spoke with pt's son, [name]. [name] was provided Medicare . Gov information and the bed offers for both parents. TOC will continue to follow for DC needs.   12:30 Pt's son has chosen [facility] for both parents. Son is agreeable to parents being in the same room. Pt is currently not medically stable for DC. Pt will need to have no tele sitter for 24 hours. Pt will not need prior authorization. TOC will continue to follow for DC needs.   Expected Discharge Plan: Skilled Nursing Facility Barriers to Discharge: Continued Medical Work up  Expected Discharge Plan and Services In-house Referral: Clinical Social Work     Living arrangements for the past 2 months: Single Family Home                                       Social Determinants of Health (SDOH) Interventions SDOH Screenings   Food Insecurity: No Food Insecurity (12/05/2021)  Housing: Low Risk  (12/05/2021)  Transportation Needs: No Transportation Needs (12/05/2021)  Alcohol Screen: Low Risk  (12/05/2021)  Depression (PHQ2-9): Low Risk  (04/10/2022)  Financial Resource Strain: Low Risk  (12/05/2021)  Physical Activity: Sufficiently Active (12/05/2021)  Social Connections: Socially Integrated (12/05/2021)  Stress: No Stress Concern Present (12/05/2021)  Tobacco Use: High Risk (08/31/2022)    Readmission Risk Interventions     No data to display

## 2022-09-04 NOTE — Consult Note (Signed)
Chief Complaint: Patient was seen in consultation today for T9 burst fracture  Referring Physician(s): Rhetta Mura, MD  Supervising Physician: Julieanne Cotton  Patient Status: [facility] - In-pt  History of Present Illness: [name] is a 82 y.o. male with PMH significant for atrial flutter, CAD with STEMI in 2016, CHF, CKD, COPD, peripheral vascular disease, and chronic C2 fracture being seen today in relation to T9 burst fracture. The patient was recently found to have suffered a fall. Patient was admitted with severe sepsis, possibly from a UTI, as well as pneumonia. Patient was referred to IR to evaluate patient for possible T9 kyphoplasty to alleviate his pain.  Past Medical History:  Diagnosis Date   ALLERGIC RHINITIS    Atrial flutter (HCC)    CAD (coronary artery disease)    CAD s/p CABG 1993 and redo 2016,   Carotid artery disease (HCC)    bilateral   Chronic combined systolic and diastolic CHF (congestive heart failure) (HCC)    CKD (chronic kidney disease), stage II    COPD (chronic obstructive pulmonary disease) (HCC)    Diverticulosis    Hyperlipidemia    Osteoarthritis    PAF (paroxysmal atrial fibrillation) (HCC)    Peripheral neuropathy    Personal history of prostate cancer    PVD (peripheral vascular disease) with claudication (HCC)    Radiation proctitis    STEMI (ST elevation myocardial infarction) (HCC) 04/29/2014   PTCA Lmain and CFX, in setting of VF/VT arrest and CGS   Tobacco use disorder, continuous    Ventral hernia     Past Surgical History:  Procedure Laterality Date   APPENDECTOMY     CARDIOVERSION N/A 06/24/2014   Procedure: CARDIOVERSION;  Surgeon: Chrystie Nose, MD;  Location: [facility] ENDOSCOPY;  Service: Cardiovascular;  Laterality: N/A;   CORONARY ARTERY BYPASS GRAFT  1992   CORONARY ARTERY BYPASS GRAFT N/A 05/28/2014   Procedure: REDO CORONARY ARTERY BYPASS GRAFTING (CABG);  Surgeon: Delight Ovens, MD;  Location: [facility]  OR;  Service: Open Heart Surgery;  Laterality: N/A;  Times 4 using right internal mammary artery to Intermediate artery and endoscopically harvested left saphenous vein to LAD, OM1, and PD coronary arteries.   LEFT HEART CATHETERIZATION WITH CORONARY ANGIOGRAM N/A 04/30/2014   Procedure: LEFT HEART CATHETERIZATION WITH CORONARY ANGIOGRAM;  Surgeon: Peter M Swaziland, MD; Lmain 90% (s/p PTCA), LAD 80%, CFX 100% (s/p PTCA), RCA OK, LIMA-LAD atretic, SVG-OM 100% (chronic), EF 45%, pt req defib x 13 for VF/VT arrest, CHB w/ tem pacer, IABP and intubation   LUMBAR LAMINECTOMY  [id number]    X2   PROSTATECTOMY  05/2008   Dr. Laverle Patter and XRT   TEE WITHOUT CARDIOVERSION N/A 05/28/2014   Procedure: TRANSESOPHAGEAL ECHOCARDIOGRAM (TEE);  Surgeon: Delight Ovens, MD;  Location: [facility] OR;  Service: Open Heart Surgery;  Laterality: N/A;   TEE WITHOUT CARDIOVERSION N/A 06/24/2014   Procedure: TRANSESOPHAGEAL ECHOCARDIOGRAM (TEE);  Surgeon: Chrystie Nose, MD;  Location: [facility] ENDOSCOPY;  Service: Cardiovascular;  Laterality: N/A;   TONSILLECTOMY      Allergies: Adhesive [tape], Codeine sulfate, Hydrocodone-acetaminophen, and Wound dressing adhesive  Medications: Prior to Admission medications   Medication Sig Start Date End Date Taking? Authorizing Provider  atorvastatin (LIPITOR) 20 MG tablet Take 1 tablet (20 mg total) by mouth daily. 04/18/22 04/18/23 Yes Plotnikov, [name], MD  ELIQUIS 5 MG TABS tablet TAKE 1 TABLET TWICE A DAY Patient taking differently: Take 5 mg by mouth 2 (two) times daily. 06/27/22  Yes Plotnikov, [name], MD  fluticasone (FLONASE) 50 MCG/ACT nasal spray Place 1 spray into both nostrils daily as needed for allergies or rhinitis. 03/16/22  Yes Plotnikov, [name], MD  folic acid (FOLVITE) 1 MG tablet TAKE 1 TABLET DAILY Patient taking differently: Take 1 mg by mouth daily. 02/28/22  Yes Plotnikov, [name], MD  KLOR-CON 8 MEQ tablet TAKE 1 TABLET DAILY (ANNUAL APPOINTMENT DUE WITH LABS,  MUST SEE PROVIDER FOR FUTURE REFILLS) Patient taking differently: Take 8 mEq by mouth daily. 02/12/22  Yes Plotnikov, [name], MD  loratadine (CLARITIN) 10 MG tablet Take 10 mg by mouth daily.   Yes [provider]  MELATONIN PO Take 1 tablet by mouth at bedtime as needed (sleep).   Yes [provider]  metoprolol succinate (TOPROL-XL) 50 MG 24 hr tablet TAKE 1 TABLET DAILY 06/18/22  Yes Kathleene Hazel, MD  pantoprazole (PROTONIX) 40 MG tablet Take 1 tablet (40 mg total) by mouth daily. 02/13/22  Yes Plotnikov, [name], MD  torsemide (DEMADEX) 20 MG tablet Take 1-2 tablets (20-40 mg total) by mouth daily. 12/20/21  Yes Plotnikov, [name], MD  traMADol (ULTRAM) 50 MG tablet Take 1 tablet (50 mg total) by mouth every 6 (six) hours as needed for severe pain. Pain 04/12/22  Yes Plotnikov, [name], MD  traZODone (DESYREL) 50 MG tablet Take 50 mg by mouth at bedtime as needed for sleep. 01/29/22 11/23/22 Yes [provider]  aspirin EC 81 MG tablet Take 1 tablet (81 mg total) by mouth daily. Patient not taking: Reported on 09/03/2022 10/03/17   Kathleene Hazel, MD     Family History  Problem Relation Age of Onset   Heart disease Father    Coronary artery disease Father    Lung cancer Sister    Heart disease Brother    Hypertension Unknown     Social History   Socioeconomic History   Marital status: Married    Spouse name: Not on file   Number of children: 3   Years of education: Not on file   Highest education level: Not on file  Occupational History   Occupation: Retired-self employed    Employer: RETIRED  Tobacco Use   Smoking status: Every Day    Years: 40    Types: Cigarettes   Smokeless tobacco: Never  Vaping Use   Vaping Use: Never used  Substance and Sexual Activity   Alcohol use: No   Drug use: No   Sexual activity: Not Currently  Other Topics Concern   Not on file  Social History Narrative   Not on file   Social  Determinants of Health   Financial Resource Strain: Low Risk  (12/05/2021)   Overall Financial Resource Strain (CARDIA)    Difficulty of Paying Living Expenses: Not hard at all  Food Insecurity: No Food Insecurity (12/05/2021)   Hunger Vital Sign    Worried About Running Out of Food in the Last Year: Never true    Ran Out of Food in the Last Year: Never true  Transportation Needs: No Transportation Needs (12/05/2021)   PRAPARE - Administrator, Civil Service (Medical): No    Lack of Transportation (Non-Medical): No  Physical Activity: Sufficiently Active (12/05/2021)   Exercise Vital Sign    Days of Exercise per Week: 5 days    Minutes of Exercise per Session: 30 min  Stress: No Stress Concern Present (12/05/2021)   [employer] of Occupational Health - Occupational  Stress Questionnaire    Feeling of Stress : Not at all  Social Connections: Socially Integrated (12/05/2021)   Social Connection and Isolation Panel [NHANES]    Frequency of Communication with Friends and Family: More than three times a week    Frequency of Social Gatherings with Friends and Family: More than three times a week    Attends Religious Services: More than 4 times per year    Active Member of [employer] or Organizations: Yes    Attends Engineer, structural: More than 4 times per year    Marital Status: Married      Code Status: Full Code   Review of Systems: A 12 point ROS discussed and pertinent positives are indicated in the HPI above.  All other systems are negative.  Review of Systems  Reason unable to perform ROS: Patient disoriented, unable to appropriately answer questions.    Vital Signs: BP 126/76 (BP Location: Left Arm)   Pulse 94   Temp 97.7 F (36.5 C) (Axillary)   Resp (!) 22   Ht 6' (1.829 m)   Wt 166 lb 7.2 oz (75.5 kg)   SpO2 96%   BMI 22.57 kg/m   Advance Care Plan: The advanced care plan/surrogate decision maker was discussed at the time of visit and documented  in the medical record.  Patient's son is surrogate Management consultant  Physical Exam Vitals reviewed.  Constitutional:      General: He is not in acute distress.    Appearance: He is ill-appearing.     Comments: Patient in mittens and hand restraints  Neck:     Comments: Patient in cervical collar Cardiovascular:     Pulses: Normal pulses.     Heart sounds: Normal heart sounds.  Pulmonary:     Effort: Pulmonary effort is normal.     Breath sounds: Wheezing present.  Abdominal:     Palpations: Abdomen is soft.     Tenderness: There is no abdominal tenderness.  Musculoskeletal:     Right lower leg: No edema.     Left lower leg: No edema.  Skin:    General: Skin is warm and dry.  Neurological:     Mental Status: He is alert. He is disoriented.     Comments: Able to follow commands     Imaging: DG CHEST PORT 1 VIEW  Result Date: 09/04/2022 CLINICAL DATA:  [id number] with shortness of breath. EXAM: PORTABLE CHEST 1 VIEW COMPARISON:  Chest CT and portable chest both 08/31/2022 FINDINGS: 3:17 a.m. Multiple overlying monitor wires. There are hazy opacities increased in the left lower lung field which could be atelectasis, pneumonia or combination. Remainder of the lungs are clear. There is mild cardiomegaly with sternotomy and CABG changes without evidence of CHF or pleural collections. Degenerative change thoracic spine. IMPRESSION: 1. Increased hazy opacities in the left lower lung field which could be atelectasis, pneumonia or combination. 2. Mild cardiomegaly with sternotomy and CABG changes. Electronically Signed   By: Almira Bar M.D.   On: 09/04/2022 03:27   ECHOCARDIOGRAM COMPLETE  Result Date: 09/02/2022    ECHOCARDIOGRAM REPORT   Patient Name:   [name] Date of Exam: 09/02/2022 Medical Rec #:  [id number]            Height:       72.0 in Accession #:    [id number]           Weight:       162.0 lb Date of Birth:  09-24-1940            BSA:          1.948 m Patient Age:     81 years             BP:           124/91 mmHg Patient Gender: M                    HR:           88 bpm. Exam Location:  Inpatient Procedure: 2D Echo, Cardiac Doppler and Color Doppler Indications:     Congestive heart failure  History:         Patient has prior history of Echocardiogram examinations, most                  recent 06/24/2014. CHF, CAD, COPD and TIA, Arrythmias:Atrial                  Fibrillation; Risk Factors:Current Smoker.  Sonographer:     Lucy Antigua Referring Phys:  [id number] Rhetta Mura Diagnosing Phys: Thurmon Fair MD IMPRESSIONS  1. Left ventricular ejection fraction, by estimation, is 35 to 40%. The left ventricle has moderately decreased function. The left ventricle demonstrates regional wall motion abnormalities (see scoring diagram/findings for description). Left ventricular  diastolic function could not be evaluated.  2. Right ventricular systolic function is mildly reduced. The right ventricular size is normal. There is normal pulmonary artery systolic pressure.  3. Left atrial size was moderately dilated.  4. Right atrial size was mildly dilated.  5. The mitral valve is normal in structure. Mild to moderate mitral valve regurgitation.  6. The aortic valve is tricuspid. There is mild calcification of the aortic valve. There is mild thickening of the aortic valve. Aortic valve regurgitation is not visualized. Aortic valve sclerosis/calcification is present, without any evidence of aortic stenosis.  7. The inferior vena cava is dilated in size with >50% respiratory variability, suggesting right atrial pressure of 8 mmHg. Comparison(s): Prior images reviewed side by side. The left ventricular function is worsened. The left ventricular wall motion abnormalities are worse. There is evidence of interval infarction in the distribution of the right coronary artery and distal left circumflex artery. FINDINGS  Left Ventricle: Left ventricular ejection fraction, by estimation, is 35 to 40%.  The left ventricle has moderately decreased function. The left ventricle demonstrates regional wall motion abnormalities. The left ventricular internal cavity size was normal in size. There is no left ventricular hypertrophy. Left ventricular diastolic function could not be evaluated due to atrial fibrillation. Left ventricular diastolic function could not be evaluated.  LV Wall Scoring: The inferior wall and posterior wall are akinetic. The antero-lateral wall and basal inferoseptal segment are hypokinetic. The entire anterior wall, entire anterior septum, entire apex, and mid inferoseptal segment are normal. Right Ventricle: The right ventricular size is normal. Right vetricular wall thickness was not well visualized. Right ventricular systolic function is mildly reduced. There is normal pulmonary artery systolic pressure. The tricuspid regurgitant velocity is 1.82 m/s, and with an assumed right atrial pressure of 8 mmHg, the estimated right ventricular systolic pressure is 21.2 mmHg. Left Atrium: Left atrial size was moderately dilated. Right Atrium: Right atrial size was mildly dilated. Pericardium: There is no evidence of pericardial effusion. Mitral Valve: The mitral valve is normal in structure. Mild to moderate mitral valve regurgitation. Tricuspid Valve: The tricuspid valve is normal in structure. Tricuspid valve regurgitation  is trivial. Aortic Valve: The aortic valve is tricuspid. There is mild calcification of the aortic valve. There is mild thickening of the aortic valve. Aortic valve regurgitation is not visualized. Aortic valve sclerosis/calcification is present, without any evidence of aortic stenosis. Aortic valve mean gradient measures 3.0 mmHg. Aortic valve peak gradient measures 6.0 mmHg. Aortic valve area, by VTI measures 1.68 cm. Pulmonic Valve: The pulmonic valve was grossly normal. Pulmonic valve regurgitation is not visualized. Aorta: The aortic root and ascending aorta are structurally  normal, with no evidence of dilitation. Venous: The inferior vena cava is dilated in size with greater than 50% respiratory variability, suggesting right atrial pressure of 8 mmHg. IAS/Shunts: No atrial level shunt detected by color flow Doppler.  LEFT VENTRICLE PLAX 2D LVIDd:         4.60 cm   Diastology LVIDs:         4.20 cm   LV e' medial:    7.83 cm/s LV PW:         1.10 cm   LV E/e' medial:  10.6 LV IVS:        1.00 cm   LV e' lateral:   7.94 cm/s LVOT diam:     1.80 cm   LV E/e' lateral: 10.4 LV SV:         34 LV SV Index:   18 LVOT Area:     2.54 cm  RIGHT VENTRICLE RV S prime:     5.77 cm/s TAPSE (M-mode): 2.1 cm LEFT ATRIUM             Index        RIGHT ATRIUM           Index LA diam:        4.41 cm 2.26 cm/m   RA Area:     18.70 cm LA Vol (A2C):   47.3 ml 24.28 ml/m  RA Volume:   50.80 ml  26.07 ml/m LA Vol (A4C):   61.8 ml 31.72 ml/m LA Biplane Vol: 55.4 ml 28.44 ml/m  AORTIC VALVE AV Area (Vmax):    1.48 cm AV Area (Vmean):   1.73 cm AV Area (VTI):     1.68 cm AV Vmax:           122.00 cm/s AV Vmean:          74.500 cm/s AV VTI:            0.203 m AV Peak Grad:      6.0 mmHg AV Mean Grad:      3.0 mmHg LVOT Vmax:         71.10 cm/s LVOT Vmean:        50.700 cm/s LVOT VTI:          0.134 m LVOT/AV VTI ratio: 0.66  AORTA Ao Root diam: 2.80 cm Ao Asc diam:  3.70 cm MITRAL VALVE               TRICUSPID VALVE MV Area (PHT): 4.46 cm    TR Peak grad:   13.2 mmHg MV Decel Time: 170 msec    TR Vmax:        182.00 cm/s MV E velocity: 82.70 cm/s                            SHUNTS  Systemic VTI:  0.13 m                            Systemic Diam: 1.80 cm Thurmon Fair MD Electronically signed by Thurmon Fair MD Signature Date/Time: 09/02/2022/10:26:59 AM    Final (Updated)    CT HIP LEFT WO CONTRAST  Result Date: 09/01/2022 CLINICAL DATA:  Left hip pain after fall. EXAM: CT OF THE LEFT HIP WITHOUT CONTRAST TECHNIQUE: Multidetector CT imaging of the left hip was performed  according to the standard protocol. Multiplanar CT image reconstructions were also generated. RADIATION DOSE REDUCTION: This exam was performed according to the departmental dose-optimization program which includes automated exposure control, adjustment of the mA and/or kV according to patient size and/or use of iterative reconstruction technique. COMPARISON:  CT abdomen pelvis from yesterday. FINDINGS: Bones/Joint/Cartilage No fracture or dislocation. Mild left hip osteoarthritis. Trace left hip joint effusion. Ligaments Ligaments are suboptimally evaluated by CT. Muscles and Tendons Grossly intact. Soft tissue No fluid collection or hematoma. No soft tissue mass. Bladder decompressed by Foley catheter. IMPRESSION: 1. No acute osseous abnormality. 2. Mild left hip osteoarthritis. Electronically Signed   By: Obie Dredge M.D.   On: 09/01/2022 16:10   CT CHEST ABDOMEN PELVIS W CONTRAST  Result Date: 08/31/2022 CLINICAL DATA:  Sepsis EXAM: CT CHEST, ABDOMEN, AND PELVIS WITH CONTRAST TECHNIQUE: Multidetector CT imaging of the chest, abdomen and pelvis was performed following the standard protocol during bolus administration of intravenous contrast. RADIATION DOSE REDUCTION: This exam was performed according to the departmental dose-optimization program which includes automated exposure control, adjustment of the mA and/or kV according to patient size and/or use of iterative reconstruction technique. CONTRAST:  75mL OMNIPAQUE IOHEXOL 350 MG/ML SOLN COMPARISON:  None Available. FINDINGS: CHEST: Cardiovascular: No aortic injury. The thoracic aorta is normal in caliber. The heart is normal in size. No significant pericardial effusion. Severe atherosclerotic plaque. The main pulmonary artery is normal in caliber. Four-vessel coronary calcifications status post coronary artery bypass graft. No central pulmonary embolus. Mediastinum/Nodes: No pneumomediastinum. No mediastinal hematoma. The esophagus is unremarkable.  The thyroid is unremarkable. The central airways are patent. No mediastinal, hilar, or axillary lymphadenopathy. Lungs/Pleura: Emphysematous changes. No focal consolidation. No pulmonary nodule. No pulmonary mass. No pulmonary contusion or laceration. No pneumatocele formation. No pleural effusion. No pneumothorax. No hemothorax. Musculoskeletal/Chest wall: No chest wall mass.  Trace bilateral gynecomastia. No acute rib or sternal fracture. Chronic T7, T8, and T9 spinous process fractures. Associated chronic appearing burst fracture of the T9 vertebral body. No retropulsion into the central canal. Approximately at least 40% vertebral body height loss centrally. ABDOMEN / PELVIS: Hepatobiliary: Not enlarged. Vague slightly nodular hypodensity along the falciform ligament also noted on delayed imaging suggestive of focal fatty infiltration. No laceration or subcapsular hematoma. Cholelithiasis.  No biliary ductal dilatation. Pancreas: Normal pancreatic contour. No main pancreatic duct dilatation. Spleen: Not enlarged. No focal lesion. No laceration, subcapsular hematoma, or vascular injury. Adrenals/Urinary Tract: No nodularity bilaterally. Calcification associated with the kidneys likely vascular. No obstructive nephrolithiasis. Bilateral kidneys enhance symmetrically. No hydronephrosis. No contusion, laceration, or subcapsular hematoma. No injury to the vascular structures or collecting systems. No hydroureter. The urinary bladder is unremarkable. Stomach/Bowel: No small or large bowel wall thickening or dilatation. The appendix is not definitely identified with no inflammatory changes in the right lower quadrant to suggest acute appendicitis. Vasculature/Lymphatics: Severe atherosclerotic plaque. No abdominal aorta or iliac aneurysm. No active contrast extravasation or pseudoaneurysm.  No abdominal, pelvic, inguinal lymphadenopathy. Reproductive: Normal. Other: No simple free fluid ascites. No pneumoperitoneum. No  hemoperitoneum. No mesenteric hematoma identified. No organized fluid collection. Musculoskeletal: No significant soft tissue hematoma. No acute pelvic fracture.  No acute spinal fracture. Ports and Devices: None. IMPRESSION: 1. No acute intrathoracic, intra-abdominal, intrapelvic traumatic injury. 2. Chronic T9 burst fracture-correlate with point tenderness to palpation to evaluate for an acute component. Otherwise no definite acute fracture or traumatic malalignment of the thoracic or lumbar spine. 3. Other imaging findings of potential clinical significance: Cholelithiasis with no acute cholecystitis. Stool throughout the colon-correlate for constipation. Aortic Atherosclerosis (ICD10-I70.0) and Emphysema (ICD10-J43.9). Electronically Signed   By: Tish Frederickson M.D.   On: 08/31/2022 22:57   CT Head Wo Contrast  Result Date: 08/31/2022 CLINICAL DATA:  Neck trauma (Age >= 65y); Mental status change, unknown cause EXAM: CT HEAD WITHOUT CONTRAST CT CERVICAL SPINE WITHOUT CONTRAST TECHNIQUE: Multidetector CT imaging of the head and cervical spine was performed following the standard protocol without intravenous contrast. Multiplanar CT image reconstructions of the cervical spine were also generated. RADIATION DOSE REDUCTION: This exam was performed according to the departmental dose-optimization program which includes automated exposure control, adjustment of the mA and/or kV according to patient size and/or use of iterative reconstruction technique. COMPARISON:  CT head and C-spine 10/23/2021 FINDINGS: CT HEAD FINDINGS Brain: Cerebral ventricle sizes are concordant with the degree of cerebral volume loss. Trace patchy and confluent areas of decreased attenuation are noted throughout the deep and periventricular white matter of the cerebral hemispheres bilaterally, compatible with chronic microvascular ischemic disease. No evidence of large-territorial acute infarction. No parenchymal hemorrhage. No mass lesion.  No extra-axial collection. No mass effect or midline shift. No hydrocephalus. Basilar cisterns are patent. Vascular: No hyperdense vessel. Skull: No acute fracture or focal lesion. Sinuses/Orbits: Paranasal sinuses and mastoid air cells are clear. The orbits are unremarkable. Other: 6 mm left frontal scalp hematoma. 13 mm right parieto-occipital scalp hematoma formation. CT CERVICAL SPINE FINDINGS Alignment: Normal. Skull base and vertebrae: Chronic C2 dens fracture with increased separation of the fracture fragments now up to 3 mm (from less than 1 mm). No aggressive appearing focal osseous lesion or focal pathologic process. Soft tissues and spinal canal: No prevertebral fluid or swelling. No visible canal hematoma. Upper chest: Emphysematous changes. Other: Atherosclerotic plaque of the carotid arteries within the neck. IMPRESSION: 1.  No acute intracranial abnormality. 2. Chronic C2 dens fracture with increased separation of the fracture fragments up to 3 mm (from less than 1 mm). Findings may be acute versus chronic. If clinically indicated, consider MRI cervical spine for further evaluation. 3. Otherwise no acute displaced fracture or traumatic listhesis of the cervical spine. 4. A 6 mm left frontal scalp hematoma. 5. A 13 mm right parieto-occipital scalp hematoma formation. 6. Emphysema (ICD10-J43.9). Electronically Signed   By: Tish Frederickson M.D.   On: 08/31/2022 22:37   CT Cervical Spine Wo Contrast  Result Date: 08/31/2022 CLINICAL DATA:  Neck trauma (Age >= 65y); Mental status change, unknown cause EXAM: CT HEAD WITHOUT CONTRAST CT CERVICAL SPINE WITHOUT CONTRAST TECHNIQUE: Multidetector CT imaging of the head and cervical spine was performed following the standard protocol without intravenous contrast. Multiplanar CT image reconstructions of the cervical spine were also generated. RADIATION DOSE REDUCTION: This exam was performed according to the departmental dose-optimization program which  includes automated exposure control, adjustment of the mA and/or kV according to patient size and/or use of iterative reconstruction technique. COMPARISON:  CT  head and C-spine 10/23/2021 FINDINGS: CT HEAD FINDINGS Brain: Cerebral ventricle sizes are concordant with the degree of cerebral volume loss. Trace patchy and confluent areas of decreased attenuation are noted throughout the deep and periventricular white matter of the cerebral hemispheres bilaterally, compatible with chronic microvascular ischemic disease. No evidence of large-territorial acute infarction. No parenchymal hemorrhage. No mass lesion. No extra-axial collection. No mass effect or midline shift. No hydrocephalus. Basilar cisterns are patent. Vascular: No hyperdense vessel. Skull: No acute fracture or focal lesion. Sinuses/Orbits: Paranasal sinuses and mastoid air cells are clear. The orbits are unremarkable. Other: 6 mm left frontal scalp hematoma. 13 mm right parieto-occipital scalp hematoma formation. CT CERVICAL SPINE FINDINGS Alignment: Normal. Skull base and vertebrae: Chronic C2 dens fracture with increased separation of the fracture fragments now up to 3 mm (from less than 1 mm). No aggressive appearing focal osseous lesion or focal pathologic process. Soft tissues and spinal canal: No prevertebral fluid or swelling. No visible canal hematoma. Upper chest: Emphysematous changes. Other: Atherosclerotic plaque of the carotid arteries within the neck. IMPRESSION: 1.  No acute intracranial abnormality. 2. Chronic C2 dens fracture with increased separation of the fracture fragments up to 3 mm (from less than 1 mm). Findings may be acute versus chronic. If clinically indicated, consider MRI cervical spine for further evaluation. 3. Otherwise no acute displaced fracture or traumatic listhesis of the cervical spine. 4. A 6 mm left frontal scalp hematoma. 5. A 13 mm right parieto-occipital scalp hematoma formation. 6. Emphysema (ICD10-J43.9).  Electronically Signed   By: Tish Frederickson M.D.   On: 08/31/2022 22:37   DG Pelvis Portable  Result Date: 08/31/2022 CLINICAL DATA:  Fall, weakness. EXAM: PORTABLE PELVIS 1-2 VIEWS COMPARISON:  None Available. FINDINGS: The cortical margins of the bony pelvis are intact. No fracture. Pubic symphysis and sacroiliac joints are congruent. Both femoral heads are well-seated in the respective acetabula. External artifact projects over the lateral left hip. Extensive arterial vascular calcifications. IMPRESSION: No pelvic fracture. Extensive arterial vascular calcifications. Electronically Signed   By: Narda Rutherford M.D.   On: 08/31/2022 20:38   DG Chest Port 1 View  Result Date: 08/31/2022 CLINICAL DATA:  Weakness nose, fall. EXAM: PORTABLE CHEST 1 VIEW COMPARISON:  Radiograph 06/08/2015 FINDINGS: Prior median sternotomy and CABG. Stable heart size allowing for lower lung volumes. Unchanged mediastinal contours. Aortic atherosclerosis. Patchy airspace opacity at the retrocardiac left lung base. No pneumothorax or large pleural effusion. On limited assessment, no acute osseous abnormalities are seen. IMPRESSION: 1. Patchy airspace opacity at the retrocardiac left lung base. This may represent pneumonia, although possibility of pulmonary contusion is considered in the setting of fall. 2. Prior CABG. Electronically Signed   By: Narda Rutherford M.D.   On: 08/31/2022 20:37    Labs:  CBC: Recent Labs    09/02/22 0034 09/03/22 0125 09/03/22 1455 09/04/22 0015  WBC 10.6* 7.4 8.6 12.6*  HGB 12.4* 12.5* 12.1* 13.8  HCT 36.1* 37.1* 35.8* 41.0  PLT 239 238 218 278    COAGS: Recent Labs    08/31/22 2020 09/01/22 2053 09/02/22 0034 09/02/22 0743 09/03/22 0125  INR 1.3*  --   --   --   --   APTT 30 43* 60* 72* 78*    BMP: Recent Labs    09/01/22 0131 09/02/22 0034 09/03/22 0125 09/04/22 0015  NA 141 142 141 141  K 3.2* 3.1* 4.1 5.2*  CL 107 107 109 113*  CO2 22 24 24  20*  GLUCOSE  142* 101* 96 144*  BUN 31* 27* 23 23  CALCIUM 8.8* 8.8* 9.0 8.9  CREATININE 1.09 0.75 0.88 0.90  GFRNONAA >60 >60 >60 >60    LIVER FUNCTION TESTS: Recent Labs    12/07/21 1203 04/10/22 1150 08/31/22 2020 09/01/22 0131  BILITOT 0.6 0.5 1.5* 1.3*  AST 15 15 104* 93*  ALT 14 14 51* 43  ALKPHOS 70 61 55 43  PROT 6.6 6.6 5.6* 4.5*  ALBUMIN 4.3 4.2 3.0* 2.4*    TUMOR MARKERS: No results for input(s): "AFPTM", "CEA", "CA199", "CHROMGRNA" in the last 8760 hours.  Assessment and Plan:  [name] is an 82 yo male with a T9 burst fracture. He has been referred to IR for possible kyphoplasty to alleviate his pain symptoms. Case has been reviewed and approved by Dr Corliss Skains. However, patient developed leukocytosis on 5/28 with possible UTI and/or aspiration pneumonia suspected. Patient will need to be clear of infection before procedure can be performed. IR will continue to follow the patient remotely and determine when kyphoplasty can be safely performed.   Risks and benefits of T9 kyphoplasty were discussed with the patient's son including, but not limited to education regarding the natural healing process of compression fractures without intervention, bleeding, infection, cement migration which may cause spinal cord damage, paralysis, pulmonary embolism or even death.  This interventional procedure involves the use of X-rays and because of the nature of the planned procedure, it is possible that we will have prolonged use of X-ray fluoroscopy.  Potential radiation risks to you include (but are not limited to) the following: - A slightly elevated risk for cancer  several years later in life. This risk is typically less than 0.5% percent. This risk is low in comparison to the normal incidence of human cancer, which is 33% for women and 50% for men according to the [employer]. - Radiation induced injury can include skin redness, resembling a rash, tissue breakdown /  ulcers and hair loss (which can be temporary or permanent).   The likelihood of either of these occurring depends on the difficulty of the procedure and whether you are sensitive to radiation due to previous procedures, disease, or genetic conditions.   IF your procedure requires a prolonged use of radiation, you will be notified and given written instructions for further action.  It is your responsibility to monitor the irradiated area for the 2 weeks following the procedure and to notify your physician if you are concerned that you have suffered a radiation induced injury.    All of the patient's son's questions were answered, patient is agreeable to proceed.  Consent signed and in IR suite.    Thank you for this interesting consult.  I greatly enjoyed meeting [name] and look forward to participating in their care.  A copy of this report was sent to the requesting provider on this date.  Electronically Signed: Kennieth Francois, PA-C 09/04/2022, 3:54 PM   I spent a total of 40 Minutes  in face to face in clinical consultation, greater than 50% of which was counseling/coordinating care for T9 burst fracture.

## 2022-09-04 NOTE — Care Management Important Message (Signed)
Important Message  Patient Details  Name: [name] MRN: [id number] Date of Birth: 05-01-1940   Medicare Important Message Given:  Yes     Dorena Bodo 09/04/2022, 3:23 PM

## 2022-09-04 NOTE — TOC Progression Note (Addendum)
Transition of Care [facility]) - Progression Note    Patient Details  Name: QUASIR CAPEHART MRN: [id number] Date of Birth: [birth year]  [facility]) CM/SW Contact  Carmina Miller, LCSWA Phone Number: 09/04/2022, 8:35 AM  Clinical Narrative:     CSW received phone call from CPS SW [place], she states report was accepted but could not provide me with the assigned CPS SW name or response time.   Expected Discharge Plan: Skilled Nursing Facility Barriers to Discharge: Continued Medical Work up  Expected Discharge Plan and Services In-house Referral: Clinical Social Work     Living arrangements for the past 2 months: Single Family Home                                       Social Determinants of Health (SDOH) Interventions SDOH Screenings   Food Insecurity: No Food Insecurity (12/05/2021)  Housing: Low Risk  (12/05/2021)  Transportation Needs: No Transportation Needs (12/05/2021)  Alcohol Screen: Low Risk  (12/05/2021)  Depression (PHQ2-9): Low Risk  (04/10/2022)  Financial Resource Strain: Low Risk  (12/05/2021)  Physical Activity: Sufficiently Active (12/05/2021)  Social Connections: Socially Integrated (12/05/2021)  Stress: No Stress Concern Present (12/05/2021)  Tobacco Use: High Risk (08/31/2022)    Readmission Risk Interventions     No data to display

## 2022-09-04 NOTE — Progress Notes (Signed)
Physical Therapy Treatment Patient Details Name: [name] MRN: [id number] DOB: Feb 07, 1941 Today's Date: 09/04/2022   History of Present Illness Patient is an 82 y/o male admitted 08/31/22 after found on floor lethargic, confused covered in feces and foul smelling urine with c-collar in place HR 120 a-fib and concern for severe sepsis and aspiration PNA. Per SLP, plan for MBS possibly on 5/29. PMH positive for macular degeneration, chronic lumbago, PVD w/ stent L SFA, R SFA. A-flutter, ICM EF 45-50% CABG 1993 & re-do 2016, h/o fall on stairs with 1mm dens fracture non-operable.    PT Comments    Pt received in supine, agreeable to therapy session with encouragement, pt lethargic and with decreased following of 1-step commands, needing increased time and multimodal cues for supine UE/LE ROM and exercises for strengthening. Pt grimacing with UE>LE ROM and RN gave pain meds at beginning of session, pt adjusted to bed chair posture for RN to give him meds and remained with HOB elevated for aspiration prevention at end of session. Due to lethargy, PTA defer EOB/OOB mobility attempts, even after supine/chair posture in bed exercises and music playing to stimulate alertness, pt maintains eyes closed and only briefly awakens with cues to open his eyes. Plan on bed>chair transfer next session if pt more alert. Incentive Spirometer and handouts for IS/cervical precs brought to room to reinforce, family not in room at time of session to educate on precs and pt will need further instruction given AMS. Pt continues to benefit from PT services to progress toward functional mobility goals.    Recommendations for follow up therapy are one component of a multi-disciplinary discharge planning process, led by the attending physician.  Recommendations may be updated based on patient status, additional functional criteria and insurance authorization.  Follow Up Recommendations  Can patient physically be  transported by private vehicle: Yes    Assistance Recommended at Discharge Frequent or constant Supervision/Assistance  Patient can return home with the following A lot of help with bathing/dressing/bathroom;Assist for transportation;Help with stairs or ramp for entrance;A lot of help with walking and/or transfers;Direct supervision/assist for medications management;Assistance with cooking/housework   Equipment Recommendations  None recommended by PT (currently hospital bed/mechanical lift level)    Recommendations for Other Services       Precautions / Restrictions Precautions Precautions: Fall;Cervical Precaution Booklet Issued: Yes (comment) Precaution Comments: handout printed and brought to room to reinforce for pt/family, pt too lethargic/confused this date to educate on well. Required Braces or Orthoses: Cervical Brace Cervical Brace: Hard collar;At all times Restrictions Weight Bearing Restrictions: No     Mobility  Bed Mobility Overal bed mobility: Needs Assistance Bed Mobility: Supine to Sit     Supine to sit: HOB elevated, +2 for physical assistance, Max assist, Total assist     General bed mobility comments: to partial long sit x2 reps while repositioning pt and removing/replacing pillow/towel roll behind his back/neck to prevent excess pressure. Pt reports he feels more comfortable after pillow removed and folded towel placed in lieu of pillow.    Transfers                   General transfer comment: defer, pt more lethargic and not able to follow commands safely to attempt EOB/OOB.       Balance Overall balance assessment: Needs assistance Sitting-balance support: Bilateral upper extremity supported Sitting balance-Leahy Scale: Zero Sitting balance - Comments: lethargy/AMS appears to be impacting balance in long sitting, defer OOB/EOB for safety.  Standing balance comment: defer for pt safety, too lethargic                             Cognition Arousal/Alertness: Lethargic Behavior During Therapy: Flat affect Overall Cognitive Status: Impaired/Different from baseline Area of Impairment: Orientation, Safety/judgement, Following commands, Problem solving, Attention, Memory, Awareness                 Orientation Level: Time, Disoriented to, Place, Situation Current Attention Level: Focused Memory: Decreased recall of precautions, Decreased short-term memory Following Commands: Follows one step commands inconsistently Safety/Judgement: Decreased awareness of safety, Decreased awareness of deficits (requiring mitts) Awareness: Intellectual Problem Solving: Slow processing, Decreased initiation, Difficulty sequencing, Requires verbal cues, Requires tactile cues General Comments: Pt with increased lethargy this date and poor command following, needing multiple repetitions of exercises with multimodal cues prior to pt participating with AAROM exercises. Pt states his name and spouse's name but seeming unaware of location/situation this date.        Exercises Other Exercises: IS x 5 reps (pt achieves 300-500 mL) Other Exercises: bed chair posture AAROM: hip flexion, LAQ x10 reps ea Other Exercises: supine BLE AAROM: heel slides, hip abduction, ankle pumps x10 reps ea Other Exercises: supine BUE AAROM: arm bicycle, shoulder shrugs (AROM) x5-10 reps ea    General Comments General comments (skin integrity, edema, etc.): prevalon boots readjusted, pt repositioned in bed chair posture to promote improved pulmonary clearance and pillows under BUE for support, HCC in place and tightened slightly for improved fit.      Pertinent Vitals/Pain Pain Assessment Pain Assessment: Faces Faces Pain Scale: Hurts even more Pain Location: upper and mid-back and generalized Pain Descriptors / Indicators: Aching, Discomfort, Grimacing, Guarding Pain Intervention(s): Limited activity within patient's tolerance, Monitored during  session, Repositioned, RN gave pain meds during session     PT Goals (current goals can now be found in the care plan section) Acute Rehab PT Goals Patient Stated Goal: to feel better PT Goal Formulation: With patient Time For Goal Achievement: 09/16/22 Progress towards PT goals: Not progressing toward goals - comment    Frequency    Min 3X/week      PT Plan Current plan remains appropriate       AM-PAC PT "6 Clicks" Mobility   Outcome Measure  Help needed turning from your back to your side while in a flat bed without using bedrails?: Total Help needed moving from lying on your back to sitting on the side of a flat bed without using bedrails?: Total Help needed moving to and from a bed to a chair (including a wheelchair)?: Total Help needed standing up from a chair using your arms (e.g., wheelchair or bedside chair)?: Total Help needed to walk in hospital room?: Total Help needed climbing 3-5 steps with a railing? : Total 6 Click Score: 6    End of Session Equipment Utilized During Treatment: Cervical collar;Oxygen Activity Tolerance: Patient limited by lethargy;Patient limited by pain Patient left: in bed;with call bell/phone within reach;with bed alarm set Nurse Communication: Mobility status;Need for lift equipment;Patient requests pain meds;Other (comment) (anticipate pt would currently need hoyer vs Stedy for OOB transfers, but too lethargic to safely attempt today) PT Visit Diagnosis: Other abnormalities of gait and mobility (R26.89);History of falling (Z91.81);Other symptoms and signs involving the nervous system (R29.898);Pain Pain - part of body:  (back, BUE)     Time: 8295-6213 (re-enter 13:55-13:59 to review IS and drop off  handouts) PT Time Calculation (min) (ACUTE ONLY): 14 min (+4 mins =18 mins)  Charges:  $Therapeutic Exercise: 8-22 mins                     Cree Kunert P., PTA Acute Rehabilitation Services Secure Chat Preferred 9a-5:30pm Office:  [phone]    Dorathy Kinsman [facility] 09/04/2022, 3:55 PM

## 2022-09-04 NOTE — Progress Notes (Addendum)
PROGRESS NOTE   [name]  VWU:[id number] DOB: 13-Nov-1940 DOA: 08/31/2022 PCP: Tresa Garter, MD  Brief Narrative:   82 year old home dwelling male ICM EF 45-50% 2016+ CABG 1993, redo 2016 CABG X4 A flutter chronic anticoagulation Eliquis CHADVASC >3-previously on amiodarone PVD + stent L SFA/right SFA 2011 Prior heavy smoker Macular degeneration Chronic lumbago Patient allegedly had a fall last year at his home falling up the stairs hit his neck and had a 1 mm dens fracture that was nonoperable at the time  Brought to emergency room with complaint of fall found down on the kitchen floor lethargic confused covered in feces foul-smelling urine c-collar in place heart rate 120 and A-fib given 500 cc bolus Workup = BUNs/creatinine 35/1.3 (baseline 1.0), AST/ALT 104/51 baseline normal bilirubin 1.5 Troponin trend 208-220, lactic acid 2.3-3.6 Potassium 3.3 W BC 9.2, hemoglobin 11.8, platelet 246 CXR patchy airspace opacity retrocardiac LL base?  Pulmonary contusion?  Pneumonia--pelvic x-rays negative for fracture CT neck = chronic C2 dens fracture increase separation of fracture segments >3 mm, chronic T9 burst fracture CT head 6 mm left frontal scalp hematoma, 13 mm right parietal occipital scalp hematoma  Neurosurgery consulted by EDP Cardiology consulted by admitting physician regarding elevated troponin 5/26 EF 35-40% decreased function regional wall motion abnormalities noted additionally 5/27 palliative care consulted for goals of care, pain management assistance Decompensated overnight and probably swallowed/aspirated the wrong way based on x-ray performed 5/28 hematuria-urology consult-CBT and hand irrigation done  Hospital-Problem based course  Severe sepsis on admission DDx urinary infection?  Aspiration-lactic acidosis 3.6 peak on admission  Currently on Omnicef which I will transition back to cefepime as I am not sure if he is eating enough--- if he  continues to aspirate he has a very poor prognosis Continued pneumonia based on x-ray performed 5/28 AM He has a contaminant growing Restarted NS 75 cc/H UC 5/24 NGTD SLP confirms need for dysphagia 3 diet  Mild metabolic encephalopathy Secondary to infection and mild underlying dementia Treat aggressively for now until we can discuss with the patient's son  Gross hematuria Secondary to Foley trauma-appreciate of urology input Hopeful to discontinue CBT a.m.-hold all anticoagulant for now-resume when urine seems more clear or as per goals of care  Elevated troponin demand ischemia/ICM EF 45-50% with redo but then had CABG 2016 x 4--repeat echo as above IV heparin GTT---->Eliquis 5/27 but then did have hematuria so we held everything altogether Not an interventional candidate Current meds metoprolol 50 twice daily--losartan cut back to 12.5 given mild hyperkalemia Outpatient follow-up to be scheduled with Dr. Clifton James--- cardiology signed off 5/28  Upper back pain 6/10 likely secondary to burst fracture with worsening Unclear if good candidate for kyphoplasty given encephalopathy?-They were consulted but not sure if he is stable enough for any procedure Tramadol 50 q12 and will also schedule the Tylenol every 6 hours  As needed morphine if severe pain-try to avoid unless really severe as patient has risk for metabolic encephalopathy  Flutter/fib paroxysmal CHADVASC >3 Echo as above Metoprolol now increased as above  Hypokalemia and now hyperkalemia mild Hold all replacement of potassium Cut the ARB back to 12.5  Fall, rhabdomyolysis CK trending down Keep Foley-the patient failed voiding trial 5/26 and will require Foley outpatient  C2 dens fracture with increasing separation >3 mm Chronic T9 burst fracture D/w Dr. Cameron Ali do pt/ot as per Dr. Danie Chandler restrictions and wear collar and keep neck neutral Patient is a poor operative candidate for C2 dens fracture  but can  undergo therapy with collar in place  Left hip pain with external rotation at hip Plain pelvic x-rays were negative No further workup as CT of left hip is -5/25  PVD Chronic smoker From vascular perspective would only resume Plavix monotherapy would hold aspirin  Stage II decubitus ulcer present on admission with unstageable-follow wound care nursing instructions Bleb on left first hallux-watch for defervescent--- monitor and float heels-Prevalon boots ordered  Social Patient takes care of his similarly aged wife who has severe dementia and his home is in a state of disrepair and apparently squalor according to the son who lives in [place] Therapy t recommends skilled  DVT prophylaxis: IV heparin Code Status: Presumed full Family Communication: Discussed in detail at the bedside with the patient's son [name] on 5/27 Left message for the patient's son on 5/28 on his cell phone Palliative care discussion with family is pending Disposition:  Status is: Inpatient Remains inpatient appropriate because:   Requires placement   Subjective:  Good discussion with patient today he seems more coherent- no chest pain but severe back pain-when I tried to sit him up he has severe discomfort in his back No fever no chills His heart rate remains elevated and cardiology is adjusting his meds given NSTEMI but no targets or candidacy for intervention given advanced frailty  Objective: Vitals:   09/04/22 0737 09/04/22 0935 09/04/22 1044 09/04/22 1526  BP: (!) 137/90 (!) 129/91 125/75 126/76  Pulse: 96 (!) 111 94 94  Resp: 15 14 17  (!) 22  Temp: (!) 97.4 F (36.3 C)  97.8 F (36.6 C) 97.7 F (36.5 C)  TempSrc: Axillary  Axillary Axillary  SpO2:  96% 93% 96%  Weight:      Height:        Intake/Output Summary (Last 24 hours) at 09/04/2022 1845 Last data filed at 09/04/2022 1534 Gross per 24 hour  Intake 749.81 ml  Output 9500 ml  Net -8750.19 ml    Filed Weights   09/02/22 0425  09/03/22 0500 09/04/22 0325  Weight: 73.5 kg 76.1 kg 75.5 kg    Examination:   Patient is more ill appearing Increased wheeze posterolaterally In mittens S1-S2 tachycardic with intermittent A-fib Abdomen soft no rebound Prevalon boots on I did not examine lower extremities today Follows commands   Data Reviewed: personally reviewed   CBC    Component Value Date/Time   WBC 12.6 (H) 09/04/2022 0015   RBC 4.09 (L) 09/04/2022 0015   HGB 13.8 09/04/2022 0015   HGB 13.7 10/20/2019 1331   HGB 13.2 12/01/2008 1247   HCT 41.0 09/04/2022 0015   HCT 38.9 10/20/2019 1331   HCT 38.3 (L) 12/01/2008 1247   PLT 278 09/04/2022 0015   PLT 231 10/20/2019 1331   MCV 100.2 (H) 09/04/2022 0015   MCV 97 10/20/2019 1331   MCV 99.9 (H) 12/01/2008 1247   MCH 33.7 09/04/2022 0015   MCHC 33.7 09/04/2022 0015   RDW 12.6 09/04/2022 0015   RDW 11.9 10/20/2019 1331   RDW 12.7 12/01/2008 1247   LYMPHSABS 0.9 09/03/2022 1455   LYMPHSABS 0.7 (L) 12/01/2008 1247   MONOABS 0.8 09/03/2022 1455   MONOABS 0.8 12/01/2008 1247   EOSABS 0.2 09/03/2022 1455   EOSABS 0.5 12/01/2008 1247   BASOSABS 0.0 09/03/2022 1455   BASOSABS 0.0 12/01/2008 1247      Latest Ref Rng & Units 09/04/2022   12:15 AM 09/03/2022    1:25 AM 09/02/2022   12:34 AM  CMP  Glucose 70 - 99 mg/dL 161  96  096   BUN 8 - 23 mg/dL 23  23  27    Creatinine 0.61 - 1.24 mg/dL 0.45  4.09  8.11   Sodium 135 - 145 mmol/L 141  141  142   Potassium 3.5 - 5.1 mmol/L 5.2  4.1  3.1   Chloride 98 - 111 mmol/L 113  109  107   CO2 22 - 32 mmol/L 20  24  24    Calcium 8.9 - 10.3 mg/dL 8.9  9.0  8.8      Radiology Studies: DG CHEST PORT 1 VIEW  Result Date: 09/04/2022 CLINICAL DATA:  [id number] with shortness of breath. EXAM: PORTABLE CHEST 1 VIEW COMPARISON:  Chest CT and portable chest both 08/31/2022 FINDINGS: 3:17 a.m. Multiple overlying monitor wires. There are hazy opacities increased in the left lower lung field which could be atelectasis,  pneumonia or combination. Remainder of the lungs are clear. There is mild cardiomegaly with sternotomy and CABG changes without evidence of CHF or pleural collections. Degenerative change thoracic spine. IMPRESSION: 1. Increased hazy opacities in the left lower lung field which could be atelectasis, pneumonia or combination. 2. Mild cardiomegaly with sternotomy and CABG changes. Electronically Signed   By: Almira Bar M.D.   On: 09/04/2022 03:27     Scheduled Meds:  acetaminophen  650 mg Oral Q6H   cefdinir  300 mg Oral Q12H   Chlorhexidine Gluconate Cloth  6 each Topical Daily   diclofenac  1 patch Transdermal BID   lidocaine  1 patch Transdermal Q24H   losartan  25 mg Oral Daily   magnesium oxide  400 mg Oral BID   metoprolol tartrate  50 mg Oral BID   [START ON 09/05/2022] multivitamin with minerals  1 tablet Oral Daily   nicotine  14 mg Transdermal Daily   sodium chloride flush  3 mL Intravenous Q12H   thiamine (VITAMIN B1) injection  100 mg Intravenous Daily   traMADol  50 mg Oral Q12H   Continuous Infusions:  sodium chloride 75 mL/hr at 09/04/22 1840   sodium chloride irrigation Stopped (09/04/22 1511)     LOS: 3 days   Time spent: 55 minutes  Rhetta Mura, MD [facility] To contact the attending provider between 7A-7P or the covering provider during after hours 7P-7A, please log into the web site [email or web address] and access using universal [place] password for that web site. If you do not have the password, please call the hospital operator.  09/04/2022, 6:45 PM

## 2022-09-04 NOTE — Consult Note (Signed)
Urology Consult  Referring physician: Dr. Mahala Menghini Reason for referral: Hematuria  Chief Complaint: Hematuria  History of Present Illness: [name] is an 82 year old male known to our practice and treated remotely by Dr. Laverle Patter for malignant neoplasm of the prostate.  Patient and wife were found down together for undetermined amount of time, of note patient's wife was wearing the same close she had been wearing approximately a week ago.  Currently being treated for sepsis, rhabdomyolysis, and C2 fracture.  Cardiology is following for elevated troponins.  He became confused and traumatically removed his Foley while on a heparin gtt., urology has been consulted to speak to the resulting hematuria.  Past Medical History:  Diagnosis Date   ALLERGIC RHINITIS    Atrial flutter (HCC)    CAD (coronary artery disease)    CAD s/p CABG 1993 and redo 2016,   Carotid artery disease (HCC)    bilateral   Chronic combined systolic and diastolic CHF (congestive heart failure) (HCC)    CKD (chronic kidney disease), stage II    COPD (chronic obstructive pulmonary disease) (HCC)    Diverticulosis    Hyperlipidemia    Osteoarthritis    PAF (paroxysmal atrial fibrillation) (HCC)    Peripheral neuropathy    Personal history of prostate cancer    PVD (peripheral vascular disease) with claudication (HCC)    Radiation proctitis    STEMI (ST elevation myocardial infarction) (HCC) 04/29/2014   PTCA Lmain and CFX, in setting of VF/VT arrest and CGS   Tobacco use disorder, continuous    Ventral hernia    Past Surgical History:  Procedure Laterality Date   APPENDECTOMY     CARDIOVERSION N/A 06/24/2014   Procedure: CARDIOVERSION;  Surgeon: Chrystie Nose, MD;  Location: [facility] ENDOSCOPY;  Service: Cardiovascular;  Laterality: N/A;   CORONARY ARTERY BYPASS GRAFT  1992   CORONARY ARTERY BYPASS GRAFT N/A 05/28/2014   Procedure: REDO CORONARY ARTERY BYPASS GRAFTING (CABG);  Surgeon: Delight Ovens, MD;   Location: [facility] OR;  Service: Open Heart Surgery;  Laterality: N/A;  Times 4 using right internal mammary artery to Intermediate artery and endoscopically harvested left saphenous vein to LAD, OM1, and PD coronary arteries.   LEFT HEART CATHETERIZATION WITH CORONARY ANGIOGRAM N/A 04/30/2014   Procedure: LEFT HEART CATHETERIZATION WITH CORONARY ANGIOGRAM;  Surgeon: Peter M Swaziland, MD; Lmain 90% (s/p PTCA), LAD 80%, CFX 100% (s/p PTCA), RCA OK, LIMA-LAD atretic, SVG-OM 100% (chronic), EF 45%, pt req defib x 13 for VF/VT arrest, CHB w/ tem pacer, IABP and intubation   LUMBAR LAMINECTOMY  4098,1191    X2   PROSTATECTOMY  05/2008   Dr. Laverle Patter and XRT   TEE WITHOUT CARDIOVERSION N/A 05/28/2014   Procedure: TRANSESOPHAGEAL ECHOCARDIOGRAM (TEE);  Surgeon: Delight Ovens, MD;  Location: [facility] OR;  Service: Open Heart Surgery;  Laterality: N/A;   TEE WITHOUT CARDIOVERSION N/A 06/24/2014   Procedure: TRANSESOPHAGEAL ECHOCARDIOGRAM (TEE);  Surgeon: Chrystie Nose, MD;  Location: [facility] ENDOSCOPY;  Service: Cardiovascular;  Laterality: N/A;   TONSILLECTOMY      Allergies:  Allergies  Allergen Reactions   Adhesive [Tape] Other (See Comments)    Very sensitive skin, tears easily   Codeine Sulfate Itching and Other (See Comments)    Headaches    Hydrocodone-Acetaminophen Nausea And Vomiting and Other (See Comments)    Headaches    Wound Dressing Adhesive Other (See Comments)    Very sensitive skin, tears easily    Family History  Problem Relation  Age of Onset   Heart disease Father    Coronary artery disease Father    Lung cancer Sister    Heart disease Brother    Hypertension Unknown    Social History:  reports that he has been smoking cigarettes. He has never used smokeless tobacco. He reports that he does not drink alcohol and does not use drugs.  ROS: delerium  Physical Exam:  Vital signs in last 24 hours: Temp:  [97.4 F (36.3 C)-98 F (36.7 C)] 97.8 F (36.6 C) (05/28 1044) Pulse Rate:   [80-111] 94 (05/28 1044) Resp:  [13-38] 17 (05/28 1044) BP: (110-138)/(62-91) 125/75 (05/28 1044) SpO2:  [89 %-97 %] 93 % (05/28 1044) Weight:  [75.5 kg] 75.5 kg (05/28 0325)  Cardiovascular: Skin warm; not flushed Respiratory: Breaths quiet; no shortness of breath Abdomen: No masses, soft, no guarding or rebound Neurological: Normal sensation to touch Musculoskeletal: C-collar Genitourinary: Foley catheter in place.  CBI dry on arrival with very light pink urine.  Laboratory Data:  Results for orders placed or performed during the hospital encounter of 08/31/22 (from the past 72 hour(s))  Urinalysis, w/ Reflex to Culture (Infection Suspected) -Urine, Catheterized     Status: Abnormal   Collection Time: 09/01/22  5:30 PM  Result Value Ref Range   Specimen Source URINE, CATHETERIZED    Color, Urine YELLOW YELLOW   APPearance CLEAR CLEAR   Specific Gravity, Urine 1.030 1.005 - 1.030   pH 5.0 5.0 - 8.0   Glucose, UA NEGATIVE NEGATIVE mg/dL   Hgb urine dipstick MODERATE (A) NEGATIVE   Bilirubin Urine NEGATIVE NEGATIVE   Ketones, ur 5 (A) NEGATIVE mg/dL   Protein, ur 30 (A) NEGATIVE mg/dL   Nitrite NEGATIVE NEGATIVE   Leukocytes,Ua MODERATE (A) NEGATIVE   RBC / HPF 6-10 0 - 5 RBC/hpf   WBC, UA 21-50 0 - 5 WBC/hpf    Comment:        Reflex urine culture not performed if WBC <=10, OR if Squamous epithelial cells >5. If Squamous epithelial cells >5 suggest recollection.    Bacteria, UA NONE SEEN NONE SEEN   Squamous Epithelial / HPF 0-5 0 - 5 /HPF   Mucus PRESENT    Hyaline Casts, UA PRESENT     Comment: Performed at [facility], [address], [place], [place] [address]  Urine Culture     Status: None   Collection Time: 09/01/22  5:30 PM   Specimen: Urine, Random  Result Value Ref Range   Specimen Description URINE, RANDOM    Special Requests URINE, CATHETERIZED    Culture      NO GROWTH Performed at [facility], [address], [place], [place] [address]     Report Status 09/02/2022 FINAL   APTT     Status: Abnormal   Collection Time: 09/01/22  8:53 PM  Result Value Ref Range   aPTT 43 (H) 24 - 36 seconds    Comment:        IF BASELINE aPTT IS ELEVATED, SUGGEST PATIENT RISK ASSESSMENT BE USED TO DETERMINE APPROPRIATE ANTICOAGULANT THERAPY. Performed at [facility], [address] [place], [place] [address]   CK     Status: Abnormal   Collection Time: 09/02/22 12:34 AM  Result Value Ref Range   Total CK 1,956 (H) 49 - 397 U/L    Comment: Performed at [facility], [address] [place], [place] [address]  Basic metabolic panel  Status: Abnormal   Collection Time: 09/02/22 12:34 AM  Result Value Ref Range   Sodium 142 135 - 145 mmol/L   Potassium 3.1 (L) 3.5 - 5.1 mmol/L   Chloride 107 98 - 111 mmol/L   CO2 24 22 - 32 mmol/L   Glucose, Bld 101 (H) 70 - 99 mg/dL    Comment: Glucose reference range applies only to samples taken after fasting for at least 8 hours.   BUN 27 (H) 8 - 23 mg/dL   Creatinine, Ser 5.40 0.61 - 1.24 mg/dL   Calcium 8.8 (L) 8.9 - 10.3 mg/dL   GFR, Estimated >98 >11 mL/min    Comment: (NOTE) Calculated using the CKD-EPI Creatinine Equation (2021)    Anion gap 11 5 - 15    Comment: Performed at [facility], [address] [place], [place] [address]  CBC     Status: Abnormal   Collection Time: 09/02/22 12:34 AM  Result Value Ref Range   WBC 10.6 (H) 4.0 - 10.5 K/uL   RBC 3.68 (L) 4.22 - 5.81 MIL/uL   Hemoglobin 12.4 (L) 13.0 - 17.0 g/dL   HCT 29.5 (L) 62.1 - 30.8 %   MCV 98.1 80.0 - 100.0 fL   MCH 33.7 26.0 - 34.0 pg   MCHC 34.3 30.0 - 36.0 g/dL   RDW 65.7 84.6 - 96.2 %   Platelets 239 150 - 400 K/uL   nRBC 0.0 0.0 - 0.2 %    Comment: Performed at [facility], [address] [place], [place] [address]  Heparin level (unfractionated)     Status: Abnormal   Collection Time: 09/02/22 12:34 AM  Result Value Ref Range   Heparin Unfractionated 1.03 (H) 0.30 - 0.70 IU/mL     Comment: (NOTE) The clinical reportable range upper limit is being lowered to >1.10 to align with the FDA approved guidance for the current laboratory assay.  If heparin results are below expected values, and patient dosage has  been confirmed, suggest follow up testing of antithrombin III levels. Performed at [facility], [address] [place], [place] [address]   APTT     Status: Abnormal   Collection Time: 09/02/22 12:34 AM  Result Value Ref Range   aPTT 60 (H) 24 - 36 seconds    Comment:        IF BASELINE aPTT IS ELEVATED, SUGGEST PATIENT RISK ASSESSMENT BE USED TO DETERMINE APPROPRIATE ANTICOAGULANT THERAPY. Performed at [facility], [address], [place], [place] [address]   APTT     Status: Abnormal   Collection Time: 09/02/22  7:43 AM  Result Value Ref Range   aPTT 72 (H) 24 - 36 seconds    Comment:        IF BASELINE aPTT IS ELEVATED, SUGGEST PATIENT RISK ASSESSMENT BE USED TO DETERMINE APPROPRIATE ANTICOAGULANT THERAPY. Performed at [facility], [address] [place], [place] [address]   Magnesium     Status: None   Collection Time: 09/02/22  7:43 AM  Result Value Ref Range   Magnesium 2.1 1.7 - 2.4 mg/dL    Comment: Performed at [facility], [address], [place], [place] [address]  Magnesium     Status: None   Collection Time: 09/02/22 10:25 AM  Result Value Ref Range   Magnesium 2.2 1.7 - 2.4 mg/dL    Comment: Performed at [facility], [address] [place], [place] [address]  CK     Status: Abnormal   Collection Time: 09/03/22  1:25 AM  Result Value Ref Range   Total CK 795 (H) 49 - 397 U/L    Comment: Performed at [facility], [address] [place], [place] [address]  Basic metabolic panel     Status: None   Collection Time: 09/03/22  1:25 AM  Result Value Ref Range   Sodium 141 135 - 145 mmol/L   Potassium 4.1 3.5 - 5.1 mmol/L   Chloride 109 98 - 111 mmol/L   CO2 24 22 - 32 mmol/L    Glucose, Bld 96 70 - 99 mg/dL    Comment: Glucose reference range applies only to samples taken after fasting for at least 8 hours.   BUN 23 8 - 23 mg/dL   Creatinine, Ser 6.04 0.61 - 1.24 mg/dL   Calcium 9.0 8.9 - 54.0 mg/dL   GFR, Estimated >98 >11 mL/min    Comment: (NOTE) Calculated using the CKD-EPI Creatinine Equation (2021)    Anion gap 8 5 - 15    Comment: Performed at [facility], [address] [place], [place] [address]  CBC     Status: Abnormal   Collection Time: 09/03/22  1:25 AM  Result Value Ref Range   WBC 7.4 4.0 - 10.5 K/uL   RBC 3.79 (L) 4.22 - 5.81 MIL/uL   Hemoglobin 12.5 (L) 13.0 - 17.0 g/dL   HCT 29.5 (L) 62.1 - 30.8 %   MCV 97.9 80.0 - 100.0 fL   MCH 33.0 26.0 - 34.0 pg   MCHC 33.7 30.0 - 36.0 g/dL   RDW 65.7 84.6 - 96.2 %   Platelets 238 150 - 400 K/uL   nRBC 0.0 0.0 - 0.2 %    Comment: Performed at [facility], [address] [place], [place] [address]  Heparin level (unfractionated)     Status: Abnormal   Collection Time: 09/03/22  1:25 AM  Result Value Ref Range   Heparin Unfractionated 0.89 (H) 0.30 - 0.70 IU/mL    Comment: (NOTE) The clinical reportable range upper limit is being lowered to >1.10 to align with the FDA approved guidance for the current laboratory assay.  If heparin results are below expected values, and patient dosage has  been confirmed, suggest follow up testing of antithrombin III levels. Performed at [facility], [address], [place], [place] [address]   APTT     Status: Abnormal   Collection Time: 09/03/22  1:25 AM  Result Value Ref Range   aPTT 78 (H) 24 - 36 seconds    Comment:        IF BASELINE aPTT IS ELEVATED, SUGGEST PATIENT RISK ASSESSMENT BE USED TO DETERMINE APPROPRIATE ANTICOAGULANT THERAPY. Performed at [facility], [address], [place], [place] [address]   CBC with Differential/Platelet     Status: Abnormal   Collection Time: 09/03/22  2:55 PM  Result Value Ref Range    WBC 8.6 4.0 - 10.5 K/uL   RBC 3.67 (L) 4.22 - 5.81 MIL/uL   Hemoglobin 12.1 (L) 13.0 - 17.0 g/dL   HCT 25.3 (L) 66.4 - 40.3 %   MCV 97.5 80.0 - 100.0 fL   MCH 33.0 26.0 - 34.0 pg   MCHC 33.8 30.0 - 36.0 g/dL   RDW 47.4 25.9 - 56.3 %   Platelets 218 150 - 400 K/uL    Comment: REPEATED TO VERIFY   nRBC  0.0 0.0 - 0.2 %   Neutrophils Relative % 77 %   Neutro Abs 6.7 1.7 - 7.7 K/uL   Lymphocytes Relative 10 %   Lymphs Abs 0.9 0.7 - 4.0 K/uL   Monocytes Relative 9 %   Monocytes Absolute 0.8 0.1 - 1.0 K/uL   Eosinophils Relative 3 %   Eosinophils Absolute 0.2 0.0 - 0.5 K/uL   Basophils Relative 1 %   Basophils Absolute 0.0 0.0 - 0.1 K/uL   Immature Granulocytes 0 %   Abs Immature Granulocytes 0.02 0.00 - 0.07 K/uL    Comment: Performed at [facility], [address], [place], [place] [address]  CK     Status: None   Collection Time: 09/04/22 12:15 AM  Result Value Ref Range   Total CK 394 49 - 397 U/L    Comment: Performed at [facility], [address], [place], [place] [address]  Basic metabolic panel     Status: Abnormal   Collection Time: 09/04/22 12:15 AM  Result Value Ref Range   Sodium 141 135 - 145 mmol/L   Potassium 5.2 (H) 3.5 - 5.1 mmol/L   Chloride 113 (H) 98 - 111 mmol/L   CO2 20 (L) 22 - 32 mmol/L   Glucose, Bld 144 (H) 70 - 99 mg/dL    Comment: Glucose reference range applies only to samples taken after fasting for at least 8 hours.   BUN 23 8 - 23 mg/dL   Creatinine, Ser 0.98 0.61 - 1.24 mg/dL   Calcium 8.9 8.9 - 11.9 mg/dL   GFR, Estimated >14 >78 mL/min    Comment: (NOTE) Calculated using the CKD-EPI Creatinine Equation (2021)    Anion gap 8 5 - 15    Comment: Performed at [facility], [address], [place], [place] [address]  CBC     Status: Abnormal   Collection Time: 09/04/22 12:15 AM  Result Value Ref Range   WBC 12.6 (H) 4.0 - 10.5 K/uL   RBC 4.09 (L) 4.22 - 5.81 MIL/uL   Hemoglobin 13.8 13.0 - 17.0 g/dL   HCT 13.0 86.5 -  78.4 %   MCV 100.2 (H) 80.0 - 100.0 fL   MCH 33.7 26.0 - 34.0 pg   MCHC 33.7 30.0 - 36.0 g/dL   RDW 69.6 29.5 - 28.4 %   Platelets 278 150 - 400 K/uL   nRBC 0.0 0.0 - 0.2 %    Comment: Performed at [facility], [address] [place], [place] [address]   Recent Results (from the past 240 hour(s))  Blood Culture (routine x 2)     Status: None (Preliminary result)   Collection Time: 08/31/22  8:20 PM   Specimen: BLOOD  Result Value Ref Range Status   Specimen Description BLOOD LEFT ANTECUBITAL  Final   Special Requests   Final    BOTTLES DRAWN AEROBIC AND ANAEROBIC Blood Culture adequate volume   Culture   Final    NO GROWTH 4 DAYS Performed at [facility], [address] [place], [place] [address]    Report Status PENDING  Incomplete  Blood Culture (routine x 2)     Status: None (Preliminary result)   Collection Time: 08/31/22  8:20 PM   Specimen: BLOOD  Result Value Ref Range Status   Specimen Description BLOOD SITE NOT SPECIFIED  Final   Special Requests   Final    BOTTLES DRAWN AEROBIC AND ANAEROBIC Blood Culture results  may not be optimal due to an excessive volume of blood received in culture bottles   Culture  Setup Time   Final    GRAM POSITIVE RODS AEROBIC BOTTLE ONLY CRITICAL RESULT CALLED TO, READ BACK BY AND VERIFIED WITH: Dan Europe 1610  [id number] FCP Performed at [facility], [address], [place], [place] [address]    Culture GRAM POSITIVE RODS  Final   Report Status PENDING  Incomplete  MRSA Next Gen by PCR, Nasal     Status: None   Collection Time: 09/01/22  3:13 AM   Specimen: Nasal Mucosa; Nasal Swab  Result Value Ref Range Status   MRSA by PCR Next Gen NOT DETECTED NOT DETECTED Final    Comment: (NOTE) The GeneXpert MRSA Assay (FDA approved for NASAL specimens only), is one component of a comprehensive MRSA colonization surveillance program. It is not intended to diagnose MRSA infection nor to guide or monitor treatment for  MRSA infections. Test performance is not FDA approved in patients less than 58 years old. Performed at [facility], [address] [place], [place] [address]   Urine Culture     Status: None   Collection Time: 09/01/22  5:30 PM   Specimen: Urine, Random  Result Value Ref Range Status   Specimen Description URINE, RANDOM  Final   Special Requests URINE, CATHETERIZED  Final   Culture   Final    NO GROWTH Performed at [facility], [address] [place], [place] [address]    Report Status 09/02/2022 FINAL  Final   Creatinine: Recent Labs    08/31/22 2020 08/31/22 2122 09/01/22 0131 09/02/22 0034 09/03/22 0125 09/04/22 0015  CREATININE 1.31* 1.00 1.09 0.75 0.88 0.90    Imaging: See report/chart CT A/P unremarkable from urologic perspective.  Assessment/Plan:  # Hematuria Traumatic Foley removal while on heparin infusion.  Heparin now on hold.  Hematuria is mild to moderate Hand irrigated with very minimal return of clot material, maybe 10cc's. Demonstrated level of tension to place catheter under with nursing. Expected consequence of traumatic Foley removal while on blood thinners. As long as he does not cause himself any more trauma and heparin infusion can remain off, this should be self-limiting within a day or 2.  No acute urologic invention indicated at this time Nursing will leave his CBI off until tomorrow as long as it stays as light as it was while I was in the room.  If any darker than a pink lemonade color, CBI will be restarted at slow gtt. and titrated as little as required to get it back to a pink lemonade color. Nursing to hand irrigate as necessary. Will check on him tomorrow    Scherrie Bateman Marlicia Sroka 09/04/2022, 12:33 PM  Pager: [phone]

## 2022-09-04 NOTE — Progress Notes (Signed)
PHARMACY - PHYSICIAN COMMUNICATION CRITICAL VALUE ALERT - BLOOD CULTURE IDENTIFICATION (BCID)  [name] is an 82 y.o. male who presented to [facility] on 08/31/2022 with a chief complaint of altered mental status, found on the floor by spouse  Assessment:  one of four blood culture bottles positive for a gram-positive rod.  BCID not performed.  Suspect contamination  Name of physician (or Provider) Contacted: Dr. Mahala Menghini notified  Current antibiotics: cefdinir  Changes to prescribed antibiotics recommended: no changes    No results found for this or any previous visit.  [name] 09/04/2022  9:45 AM

## 2022-09-04 NOTE — Progress Notes (Signed)
Initial Nutrition Assessment  DOCUMENTATION CODES:   Non-severe (moderate) malnutrition in context of chronic illness  INTERVENTION:   - Magic Cup TID with meals, each supplement provides 290 kcal and 9 grams of protein  - Mighty Shake TID with meals, each supplement provides 330 kcals and 9 grams of protein  - Changed pt to "not appropriate for room service" so that he receives automatic meal trays  - Encourage PO intake and provide feeding assistance  - MVI with minerals daily  NUTRITION DIAGNOSIS:   Moderate Malnutrition related to chronic illness (CKD, COPD) as evidenced by moderate fat depletion, severe muscle depletion.  GOAL:   Patient will meet greater than or equal to 90% of their needs  MONITOR:   PO intake, Supplement acceptance, Labs, Weight trends, Skin  REASON FOR ASSESSMENT:   Other (pressure injury, malnutrition diagnosis)    ASSESSMENT:   82 year old male who presented to the ED on 5/24 after a fall. PMH of CAD s/p CABG x 4, TIA, CKD stage II, COPD, HLD, PVD. Pt admitted with severe sepsis, rhabdomyolysis, C2 fracture.  05/25 - diet advanced to dysphagia 3 with thin liquids 05/26 - diet downgraded to dysphagia 3 with nectar-thick liquids  Palliative Medicine has been consulted regarding GOC and pain management assistance. Noted pt is the caregiver for his wife with dementia who is also currently hospitalized.  Spoke with pt briefly at bedside. Pt unable to provide diet and weight history at this time. He falls back asleep quickly after being awoken by RD.  Noted untouched lunch meal tray in room. NT reports that she and other RNs have attempted to feed pt multiple times but he either refuses or is too lethargic for POs.  Reviewed weight history in chart. Pt with a 6.6 kg weight loss since 04/10/22. This is an 8% weight loss in 5 months which is not clinically significant for timeframe but is concerning given pt meets criteria for moderate malnutrition  based on NFPE.  RD will order nectar-thick oral nutrition supplements. Will also order daily MVI with minerals and automatic meal trays so that pt does not miss a meal.  Suspect that pt will not be able to meet nutritional needs via PO route at this time. Will monitor PO intake and level of alertness and follow along for GOC discussions.  Pt with non-pitting edema to BLE.  Admit weight: 77.1 kg Current weight: 75.5 kg  Meal Completion: 5-50%  Medications reviewed and include: magnesium oxide 400 mg BID, IV thiamine 100 mg daily IVF: NS @ 75 ml/hr  Labs reviewed: potassium 5.2, WBC 12.6  UOP: 7100 ml x 24 hours I/O's: -5.9 L since admit  NUTRITION - FOCUSED PHYSICAL EXAM:  Flowsheet Row Most Recent Value  Orbital Region Moderate depletion  Upper Arm Region Moderate depletion  Thoracic and Lumbar Region Moderate depletion  Buccal Region Moderate depletion  Temple Region Moderate depletion  Clavicle Bone Region Severe depletion  Clavicle and Acromion Bone Region Severe depletion  Scapular Bone Region Moderate depletion  Dorsal Hand Moderate depletion  Patellar Region Moderate depletion  Anterior Thigh Region Severe depletion  Posterior Calf Region Moderate depletion  Edema (RD Assessment) Mild  [BLE]  Hair Reviewed  Eyes Reviewed  Mouth Reviewed  Skin Reviewed  Nails Reviewed       Diet Order:   Diet Order             DIET DYS 3 Room service appropriate? No; Fluid consistency: Nectar Thick  Diet effective now  EDUCATION NEEDS:   Not appropriate for education at this time  Skin:  Skin Assessment: (per WOC note) Skin Integrity Issues: DTI: L buttock Stage II: R buttock  Last BM:  09/04/22  Height:   Ht Readings from Last 1 Encounters:  09/01/22 6' (1.829 m)    Weight:   Wt Readings from Last 1 Encounters:  09/04/22 75.5 kg    BMI:  Body mass index is 22.57 kg/m.  Estimated Nutritional Needs:   Kcal:   1800-2000  Protein:  85-100 grams  Fluid:  1.8-2.0 L    Mertie Clause, MS, RD, LDN Inpatient Clinical Dietitian Please see AMiON for contact information.

## 2022-09-05 ENCOUNTER — Inpatient Hospital Stay (HOSPITAL_COMMUNITY): Payer: Medicare Other

## 2022-09-05 DIAGNOSIS — M6282 Rhabdomyolysis: Secondary | ICD-10-CM | POA: Diagnosis not present

## 2022-09-05 DIAGNOSIS — R627 Adult failure to thrive: Secondary | ICD-10-CM | POA: Diagnosis not present

## 2022-09-05 DIAGNOSIS — Z7189 Other specified counseling: Secondary | ICD-10-CM | POA: Diagnosis not present

## 2022-09-05 DIAGNOSIS — S12100A Unspecified displaced fracture of second cervical vertebra, initial encounter for closed fracture: Secondary | ICD-10-CM | POA: Diagnosis not present

## 2022-09-05 DIAGNOSIS — S12100S Unspecified displaced fracture of second cervical vertebra, sequela: Secondary | ICD-10-CM | POA: Diagnosis not present

## 2022-09-05 DIAGNOSIS — R7989 Other specified abnormal findings of blood chemistry: Secondary | ICD-10-CM | POA: Diagnosis present

## 2022-09-05 LAB — BASIC METABOLIC PANEL
Anion gap: 10 (ref 5–15)
BUN: 27 mg/dL — ABNORMAL HIGH (ref 8–23)
CO2: 19 mmol/L — ABNORMAL LOW (ref 22–32)
Calcium: 9.1 mg/dL (ref 8.9–10.3)
Chloride: 115 mmol/L — ABNORMAL HIGH (ref 98–111)
Creatinine, Ser: 0.93 mg/dL (ref 0.61–1.24)
GFR, Estimated: >60 mL/min (ref >60)
Glucose, Bld: 131 mg/dL — ABNORMAL HIGH (ref 70–99)
Potassium: 4.9 mmol/L (ref 3.5–5.1)
Sodium: 144 mmol/L (ref 135–145)

## 2022-09-05 LAB — CBC
HCT: 39.8 % (ref 39.0–52.0)
Hemoglobin: 13.2 g/dL (ref 13.0–17.0)
MCH: 32.8 pg (ref 26.0–34.0)
MCHC: 33.2 g/dL (ref 30.0–36.0)
MCV: 99 fL (ref 80.0–100.0)
Platelets: 275 10*3/uL (ref 150–400)
RBC: 4.02 MIL/uL — ABNORMAL LOW (ref 4.22–5.81)
RDW: 12.6 % (ref 11.5–15.5)
WBC: 11.9 10*3/uL — ABNORMAL HIGH (ref 4.0–10.5)
nRBC: 0 % (ref 0.0–0.2)

## 2022-09-05 LAB — CULTURE, BLOOD (ROUTINE X 2)

## 2022-09-05 LAB — GLUCOSE, CAPILLARY: Glucose-Capillary: 124 mg/dL — ABNORMAL HIGH (ref 70–99)

## 2022-09-05 LAB — PROCALCITONIN: Procalcitonin: <0.1 ng/mL

## 2022-09-05 LAB — FOLATE: Folate: 32.3 ng/mL (ref >5.9)

## 2022-09-05 LAB — AMMONIA: Ammonia: 38 umol/L — ABNORMAL HIGH (ref 9–35)

## 2022-09-05 LAB — LACTIC ACID, PLASMA: Lactic Acid, Venous: 1.8 mmol/L (ref 0.5–1.9)

## 2022-09-05 LAB — VITAMIN B12: Vitamin B-12: 788 pg/mL (ref 180–914)

## 2022-09-05 MED ORDER — SODIUM CHLORIDE 0.9 % IV SOLN
3.0000 g | Freq: Four times a day (QID) | INTRAVENOUS | Status: DC
  Administered 2022-09-05 – 2022-09-09 (×16): 3 g via INTRAVENOUS
  Filled 2022-09-05 (×16): qty 8

## 2022-09-05 MED ORDER — METOPROLOL TARTRATE 5 MG/5ML IV SOLN
5.0000 mg | INTRAVENOUS | Status: DC | PRN
  Administered 2022-09-05: 5 mg via INTRAVENOUS
  Filled 2022-09-05: qty 5

## 2022-09-05 NOTE — Progress Notes (Signed)
Providing Compassionate, Quality Care - Together   Subjective: Neurosurgery contacted following patient's head CT for recommendations regarding the patient's SDH. Patient is resting upon assessment. He denies pain presently.  Objective: Vital signs in last 24 hours: Temp:  [97.5 F (36.4 C)-99.3 F (37.4 C)] 97.6 F (36.4 C) (05/29 0700) Pulse Rate:  [88-114] 114 (05/29 1104) Resp:  [12-22] 12 (05/29 0700) BP: (126-145)/(69-109) 145/96 (05/29 1104) SpO2:  [94 %-96 %] 96 % (05/29 0700) Weight:  [75.9 kg] 75.9 kg (05/29 0300)  Intake/Output from previous day: 05/28 0701 - 05/29 0700 In: 1737.2 [I.V.:1637.2; IV Piggyback:100] Out: 4150 [Urine:4150] Intake/Output this shift: No intake/output data recorded.  Responds to voice PERRLA Oriented to self, dysarthric MAE, Follows commands Generalized weakness BUE, BLE   Lab Results: Recent Labs    09/04/22 0015 09/05/22 0034  WBC 12.6* 11.9*  HGB 13.8 13.2  HCT 41.0 39.8  PLT 278 275   BMET Recent Labs    09/04/22 0015 09/05/22 0034  NA 141 144  K 5.2* 4.9  CL 113* 115*  CO2 20* 19*  GLUCOSE 144* 131*  BUN 23 27*  CREATININE 0.90 0.93  CALCIUM 8.9 9.1    Studies/Results: CT HEAD WO CONTRAST ( )  Addendum Date: 09/05/2022   ADDENDUM REPORT: 09/05/2022 10:09 ADDENDUM: Study discussed by telephone with Dr. Edsel Petrin PATEL on 09/05/2022 at 1004 hours. Electronically Signed   By: Odessa Fleming M.D.   On: 09/05/2022 10:09   Result Date: 09/05/2022 CLINICAL DATA:  82 year old male with altered mental status. EXAM: CT HEAD WITHOUT CONTRAST TECHNIQUE: Contiguous axial images were obtained from the base of the skull through the vertex without intravenous contrast. RADIATION DOSE REDUCTION: This exam was performed according to the departmental dose-optimization program which includes automated exposure control, adjustment of the mA and/or kV according to patient size and/or use of iterative reconstruction technique. COMPARISON:   Head CT 08/31/2022.  Brain MRI 04/21/2009. FINDINGS: Brain: Cerebral volume is within normal limits for age. Is stable and within normal limits. New isodense to hypodense small bilateral vertex subdural hematomas (coronal images 32 through 39) measuring 3-4 mm in thickness bilaterally. No significant intracranial mass effect. No midline shift. No ventriculomegaly. No other intracranial hemorrhage identified. Basilar cisterns remain normal. Gray-white matter differentiation Vascular: No suspicious intracranial vascular hyperdensity. Calcified atherosclerosis at the skull base. Skull: Stable visualized osseous structures. Sinuses/Orbits: Visualized paranasal sinuses and mastoids are stable and well aerated. Other: Largely resolved scalp hematoma since 08/31/2022. Orbits soft tissues appears stable, negative. IMPRESSION: 1. Evidence of small new bilateral superior convexity Subdural hematomas, 3-4 mm in thickness. But no intracranial mass effect. No skull fracture identified. And largely resolved scalp hematoma since 08/31/2022. 2. No other acute intracranial abnormality. Electronically Signed: By: Odessa Fleming M.D. On: 09/05/2022 09:46   DG CHEST PORT 1 VIEW  Result Date: 09/04/2022 CLINICAL DATA:  [id number] with shortness of breath. EXAM: PORTABLE CHEST 1 VIEW COMPARISON:  Chest CT and portable chest both 08/31/2022 FINDINGS: 3:17 a.m. Multiple overlying monitor wires. There are hazy opacities increased in the left lower lung field which could be atelectasis, pneumonia or combination. Remainder of the lungs are clear. There is mild cardiomegaly with sternotomy and CABG changes without evidence of CHF or pleural collections. Degenerative change thoracic spine. IMPRESSION: 1. Increased hazy opacities in the left lower lung field which could be atelectasis, pneumonia or combination. 2. Mild cardiomegaly with sternotomy and CABG changes. Electronically Signed   By: Almira Bar M.D.   On: 09/04/2022  03:27     Assessment/Plan: Patient with small bilateral superior convexity SDH. Recommend continuing to hold Eliquis. No surgical intervention recommended.    LOS: 4 days     Val Eagle, DNP, AGNP-C Nurse Practitioner  [facility] [address], [address], [place], [place] [address] P: [phone]    F: [phone]  09/05/2022, 12:59 PM

## 2022-09-05 NOTE — Hospital Course (Addendum)
past medical history of A-Fib on Eliquis, COPD, CAD s/p CABG x 4, PVD with bilateral stents, TIA, macular degeneration, stage II decubitus ulcer admitted on 08/13/2022 after found down at home after fall with altered mental status.  Found to have C2 fracture, T9 fracture.  Also had elevated troponin for which patient was started on IV heparin but then developed hematuria secondary to Foley trauma now requiring CBI.  Also found to have SDH. Cardiology, palliative care, IR, neurosurgery, urology consulted. Mentation progressively worsening.  Unable to swallow safely.  Developing possible mucous plugging.  Continues to have abdominal pain and tenderness.  Also developed hypernatremia.  Discussed with family.  Prognosis very poor.  If his condition deteriorates further would recommend transition to complete comfort.  Not stable for kyphoplasty. 6/3 overnight remains tachycardic.  Digoxin level erroneously checked and were elevated.  Multiple lab error seen with regards to sodium level, glucose level.  Renal function worsening.  Leukocytosis.  Respiratory stress not improving requiring BiPAP.

## 2022-09-05 NOTE — Progress Notes (Signed)
[facility] Progress Note Patient: [name] ZOX:[id number] DOB: 11-Oct-1940 DOA: 08/31/2022  DOS: the patient was seen and examined on 09/05/2022  Brief hospital course: past medical history of A-Fib on Eliquis, COPD, CAD s/p CABG x 4, PVD with bilateral stents, TIA, macular degeneration, stage II decubitus ulcer admitted on 08/31/2022 after found down at home after fall with altered mental status.  Found to have C2 fracture, T9 fracture.  Also had elevated troponin for which patient was started on IV heparin but then developed hematuria secondary to Foley trauma now requiring CBI.  Also found to have SDH. Cardiology, palliative care, IR, neurosurgery, urology consulted. Assessment and Plan: Unwitnessed fall. Acute metabolic encephalopathy. Ongoing confusion.  Progressive lethargy. Metabolic workup so far shows mild AKI.  Possible concussion and very can be ruled out. Repeat CT scan on 5/29 shows SDH although would not explain patient's progressive lethargy ongoing for last few days. Possible cefepime induced encephalopathy cannot be ruled out as well. Monitor for now.  Concern for sepsis.  Ruled out. Patient had lactic acidosis at the time of admission and there was some concern that he could have an aspiration event with sepsis. Speech therapy following the patient. Chest x-ray does not show any significant pneumonia. Currently on IV cefepime.  I will switch to IV Unasyn.  Monitor.  C2 fracture. SDH. Discussed with neurology. Recommend conservative measures. No intervention recommended.  T9 fracture. IR consulted.  Kyphoplasty recommended.  Will monitor.  Elevated troponin. Acute systolic CHF. Echocardiogram shows EF of 45 to 50%. Troponins were minimally elevated without any ACS pattern. EKG unremarkable. Patient unable to tell me whether he has any angina symptoms or not. Less likely this is ACS.  Most likely this is related to his rhabdomyolysis. Patient was  treated with IV heparin which is now discontinued due to ongoing hematuria. Monitor. Eventual plan is to transition to Eliquis.  Treating conservatively.  Paroxysmal A-fib. On metoprolol.  Monitor.  Rhabdomyolysis. Nontraumatic. Treated with IV fluid. Monitor.  Gross hematuria. From Foley trauma. Patient was trying to pull on his Foley catheter while receiving IV heparin. CBI recommended. Monitor.  PVD. Will resume Plavix once stable.  Goals of care conversation. Patient's prognosis is poor. Oral intake is minimal. Discussed with son at bedside. Patient does not have any HCPOA documentation. Son [name] currently agreeable for DNR and is in the process of discussing with rest of the family members to gather their agreement as well. Will monitor. Palliative care consulted.  Left ischial tuberosity pressure ulcer stage I, unstageable medial coccyx pressure injury, left foot stage II pressure injury. Present on admission. Continue home dressing.   Subjective: No nausea no vomiting no fever no chills.  Denies any acute complaint.  Per RN patient is more lethargic compared to before.  Per note review unchanged from yesterday.  Physical Exam: General: in Mild distress, No Rash Cardiovascular: S1 and S2 Present, No Murmur Respiratory: Good respiratory effort, Bilateral Air entry present. No Crackles, No wheezes Abdomen: Bowel Sound present, No tenderness Extremities: No edema Neuro: Alert and oriented to self only, no new focal deficit  Data Reviewed: I have Reviewed nursing notes, Vitals, and Lab results. Since last encounter, pertinent lab results CBC and BMP   . I have ordered test including CBC and BMP  . I have discussed pt's care plan and test results with palliative care and neurosurgery  . I have ordered imaging CT head x-ray chest and x-ray abdomen  .   Disposition: Status is:  Inpatient Remains inpatient appropriate because: Need for further therapy and goals of  care discussion  Family Communication: Discussed with son at bedside Level of care: Progressive   Vitals:   09/05/22 0300 09/05/22 0700 09/05/22 1104 09/05/22 1700  BP:  137/69 (!) 145/96 (!) 156/98  Pulse:  97 (!) 114 (!) 103  Resp: 14 12  (!) 25  Temp: (!) 97.5 F (36.4 C) 97.6 F (36.4 C)  97.6 F (36.4 C)  TempSrc: Axillary Oral  Oral  SpO2:  96%  93%  Weight: 75.9 kg     Height:         Author: Lynden Oxford, MD 09/05/2022 5:44 PM  Please look on [email or web address] to find out who is on call.

## 2022-09-05 NOTE — Progress Notes (Signed)
This chaplain responded to consult for spiritual care with phone call, as requested, to the Pt. RN-Patrice.  The chaplain reviewed the Pt. chart notes and was updated by the RN on the consult. The chaplain understands the medical team and Pt. son-[name] are hoping to create/update the Pt. Advance Directive. Chart review indicates the Pt. is unable to complete an AD at this time.  The chaplain encouraged the RN to secure chat PMT NP-Kasie with the family's request for communication.  The chaplain will coordinate the best time for ACP visit with the PMT.  Chaplain Stephanie Acre [phone]

## 2022-09-05 NOTE — Progress Notes (Signed)
Speech Language Pathology Treatment: Dysphagia  Patient Details Name: [name] MRN: [id number] DOB: 1940/06/03 Today's Date: 09/05/2022 Time: 4098-1191 SLP Time Calculation (min) (ACUTE ONLY): 23 min  Assessment / Plan / Recommendation Clinical Impression  Pt seen for dyphagia f/u tx session d/t nursing concerns with "choking with medication during last shift."  Pt confused, but alert, requiring mod verbal/tactile cues with intermittent ability to follow directives.  Pt was grimacing in pain during majority of session with limited po intake d/t this fact paired with impaired mentation. Pt consumed 4-5 bites of puree after encouragement, verbal/tactile cues and upright positioning.  Pt provided oral care prior to intake d/t prominent xerostomia/cracked tongue/residual coating on tongue prior to any food/drink administered by SLP.  Nursing also provided medication crushed with puree during session with improved oral manipulation as pt continued to consume successive bites.  Multiple audible sub-swallows initiated with puree.  One tsp amount and cup sip of nectar-thickened liquids observed without overt s/sx of aspiration, but oral holding/delay in the initiation of the swallow noted.  Recommend downgrading diet to Dysphagia 2(minced)/nectar-thickened liquids via tsp/cup with FULL supervision/aspiration precautions in place.  Medications crushed in puree.  Recommend objective swallow study prior to d/c to assess safest consistency once mentation improves to ensure adequate assessment.  ST will continue to f/u in acute setting for dysphagia tx/management.  HPI HPI: The patient is an 82 year old white male with multiple medical problems who by report was found lying on the floor with his wife covered in stool and urine. He was brought to [facility] and was worked up with a head CT which was unremarkable and a cervical CT which demonstrated a type II odontoid fracture. Patient takes care of  his similarly aged wife who has severe dementia and his home is in a state of disrepair and apparently squalor according to the son who lives in [place]. ST recommended D3(mechanical soft)/nectar-thickened liquids and objective assessment prior to d/c.  ST f/u for dysphagia tx/management.      SLP Plan  Goals updated      Recommendations for follow up therapy are one component of a multi-disciplinary discharge planning process, led by the attending physician.  Recommendations may be updated based on patient status, additional functional criteria and insurance authorization.    Recommendations  Diet recommendations: Nectar-thick liquid;Dysphagia 2 (fine chop) Liquids provided via: Teaspoon;Cup Medication Administration: Crushed with puree Supervision: Staff to assist with self feeding;Full supervision/cueing for compensatory strategies Compensations: Small sips/bites;Slow rate;Follow solids with liquid Postural Changes and/or Swallow Maneuvers: Seated upright 90 degrees                  Oral care BID;Staff/trained caregiver to provide oral care   Frequent or constant Supervision/Assistance Dysphagia, oropharyngeal phase (R13.12)     Goals updated     Pat Abner Ardis,M.S., CCC-SLP  09/05/2022, 11:24 AM

## 2022-09-05 NOTE — Progress Notes (Addendum)
Daily Progress Note   Patient Name: [name]       Date: 09/05/2022 DOB: 02-10-1941  Age: 82 y.o. MRN#: [id number] Attending Physician: Nathan Salter, MD Primary Care Physician: Nathan Garter, MD Admit Date: 08/31/2022  Reason for Consultation/Follow-up: Establishing goals of care and Pain control  Patient Profile/HPI:  82 y.o. male  with past medical history of A-Fib on Eliquis, COPD, CAD s/p CABG x 4, PVD with bilateral stents, TIA, macular degeneration, stage II decubitus ulcer admitted on 08/31/2022 after found down at home after fall with altered mental status.    Patient is admitted with acute metabolic encephalopathy, A-fib with RVR, AKI, nontraumatic rhabdomyolysis, worsening odontoid fracture, and sepsis likely secondary to UTI. PMT has been consulted to assist with acute on chronic pain management of fracture as well as goals of care conversation.  Subjective: Chart reviewed including labs, progress notes, imaging from this and previous encounters.  Evaluated patient.  He was lethargic, did not answer my questions. Noted CT scan with small SDH- no surgical recommendations.  Received message from nurse that patient's son called upset because someone left a message saying patient's code status had been changed by Palliative.  Called [name]. I clarified with [name] that patient was still full code, no changes in code status had been made. Per [name] he received a voicemail from [name] (ph number [phone]) that stated Palliative had changed patients code status. Per chart review- patient has an APS worker assigned by the name of [name] with same phone number.  [name] shared that he had previous discussion with [name] regarding code status and he is in process of  discussion with his brothers before making any changes.  [name] inquired about HCPOA- I discussed with [name] that due to patient's altered mental status HCPOA cannot be completed. [name] asked about taking care of patients financial bills, I advised that patient's mental status was such that he cannot appoint an HCPOA or complete any legal documents. Also hospital is unable to assist with financial or durable power of attorneys..  I further discussed my concern regarding [name]'s overall trajectory and [name] agreed.  I called [name] and informed her that Palliative did not change patient's code status and she should not leave sensitive information on a family members voicemail.  Review of Systems  Unable to perform ROS: Mental status change     Physical Exam Vitals and nursing note reviewed.  Constitutional:      General: He is not in acute distress.    Appearance: He is ill-appearing.  Cardiovascular:     Rate and Rhythm: Normal rate.  Neurological:     Comments: sleepy             Vital Signs: BP (!) 145/96   Pulse (!) 114   Temp 97.6 F (36.4 C) (Oral)   Resp 12   Ht 6' (1.829 m)   Wt 75.9 kg   SpO2 96%   BMI 22.69 kg/m  SpO2: SpO2: 96 % O2 Device: O2 Device: Nasal Cannula O2 Flow Rate: O2 Flow Rate (L/min): 2 L/min  Intake/output summary:  Intake/Output Summary (Last 24 hours) at 09/05/2022 1450 Last data filed at 09/05/2022 0354 Gross per 24 hour  Intake 1727.16 ml  Output 800 ml  Net 927.16 ml    LBM: Last BM Date : 09/04/22 Baseline Weight: Weight: 77.1 kg Most recent weight: Weight: 75.9 kg       Palliative Assessment/Data: PPS: 30%       Patient Active Problem List   Diagnosis Date Noted  . Elevated troponin 09/05/2022  . Malnutrition of moderate degree 09/04/2022  . PAF (paroxysmal atrial fibrillation) (HCC) 09/02/2022  . Chronic atrial fibrillation with RVR (HCC) 09/01/2022  . Rhabdomyolysis 09/01/2022  . AKI (acute kidney injury) (HCC) 09/01/2022   . AMS (altered mental status) 09/01/2022  . Abnormal LFTs 09/01/2022  . Sepsis secondary to UTI (HCC) 09/01/2022  . Sensorineural hearing loss 04/10/2022  . Tinnitus 04/10/2022  . Edema 12/20/2021  . Cellulitis 12/20/2021  . Odontoid fracture (HCC) 12/07/2021  . Fall 12/07/2021  . Atherosclerotic heart disease of native coronary artery with unspecified angina pectoris (HCC) 10/23/2021  . Nicotine dependence 10/23/2021  . Head trauma 10/23/2021  . On anticoagulant therapy 10/23/2021  . Injury of neck 10/23/2021  . Vision abnormalities 10/23/2021  . Fall from slipping on ice 05/03/2020  . Acute hand eczema 11/16/2019  . Impetigo 11/16/2019  . Bursitis of left elbow 01/07/2019  . Trigger finger 08/22/2017  . Insomnia 12/19/2015  . Prostate cancer (HCC) 12/19/2015  . Other bursitis of elbow, right elbow 06/25/2015  . Epiretinal membrane (ERM) of right eye 01/18/2015  . Exudative macular degeneration (HCC) 01/18/2015  . Pseudophakia of both eyes 01/18/2015  . Hypokalemia 06/29/2014  . PVD (peripheral vascular disease) with claudication (HCC)   . COPD (chronic obstructive pulmonary disease) (HCC)   . Hyponatremia   . Nausea with vomiting   . Orthostatic hypotension 06/22/2014  . Dehydration 06/22/2014  . Atrial flutter (HCC) 06/22/2014  . Pain in joint, shoulder region 06/14/2014  . Acute blood loss anemia 06/09/2014  . Hypertensive heart disease 06/09/2014  . Hematuria 05/31/2014  . S/P CABG x 4 05/28/2014  . Chronic anticoagulation-on Eliquis at discharge 05/11/14 05/25/2014  . ICM-EF 45-50% by echo 04/30/14 05/25/2014  . Protein-calorie malnutrition, severe (HCC) 05/25/2014  . Encephalopathy 05/14/2014  . Acute respiratory acidosis (HCC)   . S/P CABG x 2 05/04/2014  . Acute respiratory failure (HCC)   . STEMI 05/04/14 with shock, CPR, CHB, VT 04/30/2014  . Cardiogenic shock (HCC) 04/30/2014  . Cardiac arrest (HCC) 04/30/2014  . VT (ventricular tachycardia) (HCC) 04/30/2014   . CHB (complete heart block) (HCC) 04/30/2014  . CAD S/P LM and CFX PCI 05/04/14 04/30/2014  . Wart 04/18/2014  . Laceration of  index finger 12/04/2012  . Hand eczema 05/26/2012  . Bruising 02/20/2012  . Nuclear cataract 12/26/2011  . Pseudophakia 12/26/2011  . 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 Actinic keratoses 11/14/2011  . Constipation 07/18/2011  . Sinusitis, acute 04/18/2011  . Ventral hernia 01/03/2011  . SWEATING 06/26/2010  . History of transient ischemic attack (TIA)   . CERUMEN IMPACTION 02/20/2010  . DIVERTICULOSIS-COLON 11/01/2009  . RECTAL BLEEDING 11/01/2009  . ABDOMINAL BLOATING 11/01/2009  . ABNORMAL BOWEL SOUNDS 11/01/2009  . ABDOMINAL PAIN-RLQ 11/01/2009  . ABDOMINAL PAIN, GENERALIZED 11/01/2009  . Rash and nonspecific skin eruption 07/11/2009  . TRANSIENT ISCHEMIC ATTACK, HX OF 03/22/2009  . BRONCHITIS, ACUTE 11/26/2008  . Personal history of malignant neoplasm of prostate 08/02/2008  . ALLERGIC RHINITIS 08/06/2007  . ELEVATED PROSTATE SPECIFIC ANTIGEN 08/06/2007  . Hyperlipidemia 04/22/2007  . Neuropathic pain of both legs 04/22/2007  . UPPER RESPIRATORY INFECTION (URI) 04/22/2007  . COPD exacerbation (HCC) 04/22/2007  . Osteoarthritis 04/22/2007  . LOW BACK PAIN 04/22/2007    Palliative Care Assessment & Plan    Assessment/Recommendations/Plan  Continue current care Plan for conference call tomorrow with [name] and his brothers for further GOC discussion   Code Status: Full code  Total time: 90 minutes  Prognosis:  Unable to determine  Thank you for allowing the Palliative Medicine Team to assist in the care of this patient.  Greater than 50%  of this time was spent counseling and coordinating care related to the above assessment and plan.  Ocie Bob, AGNP-C Palliative Medicine   Please contact Palliative Medicine Team phone at [phone] for questions and concerns.

## 2022-09-05 NOTE — Progress Notes (Signed)
APS is involved with the pts care. The case worker is Alda Lea Number is [phone]. Please call with any changes.

## 2022-09-05 NOTE — Progress Notes (Signed)
Subjective: 5/29: Family was in the room on arrival today.  Patient is still struggling with confusion.  No acute events overnight.  He is in mittens at this point to prevent removal of medical equipment  Objective: Vital signs in last 24 hours: Temp:  [97.5 F (36.4 C)-99.3 F (37.4 C)] 97.6 F (36.4 C) (05/29 0700) Pulse Rate:  [88-97] 97 (05/29 0700) Resp:  [12-22] 12 (05/29 0700) BP: (126-137)/(69-109) 137/69 (05/29 0700) SpO2:  [94 %-96 %] 96 % (05/29 0700) Weight:  [75.9 kg] 75.9 kg (05/29 0300)  Intake/Output from previous day: 05/28 0701 - 05/29 0700 In: 1737.2 [I.V.:1637.2; IV Piggyback:100] Out: 4150 [Urine:4150]  Intake/Output this shift: No intake/output data recorded.  Physical Exam:  General: Alert and oriented CV: No cyanosis Lungs: equal chest rise Abdomen: Soft, NTND, no rebound or guarding Gu: Three-way Foley catheter clamped with clear urine in tubing and some old blood in the bag.  Lab Results: Recent Labs    09/03/22 1455 09/04/22 0015 09/05/22 0034  HGB 12.1* 13.8 13.2  HCT 35.8* 41.0 39.8   BMET Recent Labs    09/04/22 0015 09/05/22 0034  NA 141 144  K 5.2* 4.9  CL 113* 115*  CO2 20* 19*  GLUCOSE 144* 131*  BUN 23 27*  CREATININE 0.90 0.93  CALCIUM 8.9 9.1     Studies/Results: CT HEAD WO CONTRAST ( )  Addendum Date: 09/05/2022   ADDENDUM REPORT: 09/05/2022 10:09 ADDENDUM: Study discussed by telephone with Dr. Edsel Petrin PATEL on 09/05/2022 at 1004 hours. Electronically Signed   By: Odessa Fleming M.D.   On: 09/05/2022 10:09   Result Date: 09/05/2022 CLINICAL DATA:  82 year old male with altered mental status. EXAM: CT HEAD WITHOUT CONTRAST TECHNIQUE: Contiguous axial images were obtained from the base of the skull through the vertex without intravenous contrast. RADIATION DOSE REDUCTION: This exam was performed according to the departmental dose-optimization program which includes automated exposure control, adjustment of the mA and/or kV  according to patient size and/or use of iterative reconstruction technique. COMPARISON:  Head CT 08/31/2022.  Brain MRI 04/21/2009. FINDINGS: Brain: Cerebral volume is within normal limits for age. Is stable and within normal limits. New isodense to hypodense small bilateral vertex subdural hematomas (coronal images 32 through 39) measuring 3-4 mm in thickness bilaterally. No significant intracranial mass effect. No midline shift. No ventriculomegaly. No other intracranial hemorrhage identified. Basilar cisterns remain normal. Gray-white matter differentiation Vascular: No suspicious intracranial vascular hyperdensity. Calcified atherosclerosis at the skull base. Skull: Stable visualized osseous structures. Sinuses/Orbits: Visualized paranasal sinuses and mastoids are stable and well aerated. Other: Largely resolved scalp hematoma since 08/31/2022. Orbits soft tissues appears stable, negative. IMPRESSION: 1. Evidence of small new bilateral superior convexity Subdural hematomas, 3-4 mm in thickness. But no intracranial mass effect. No skull fracture identified. And largely resolved scalp hematoma since 08/31/2022. 2. No other acute intracranial abnormality. Electronically Signed: By: Odessa Fleming M.D. On: 09/05/2022 09:46   DG CHEST PORT 1 VIEW  Result Date: 09/04/2022 CLINICAL DATA:  [id number] with shortness of breath. EXAM: PORTABLE CHEST 1 VIEW COMPARISON:  Chest CT and portable chest both 08/31/2022 FINDINGS: 3:17 a.m. Multiple overlying monitor wires. There are hazy opacities increased in the left lower lung field which could be atelectasis, pneumonia or combination. Remainder of the lungs are clear. There is mild cardiomegaly with sternotomy and CABG changes without evidence of CHF or pleural collections. Degenerative change thoracic spine. IMPRESSION: 1. Increased hazy opacities in the left lower lung field which could be  atelectasis, pneumonia or combination. 2. Mild cardiomegaly with sternotomy and CABG changes.  Electronically Signed   By: Almira Bar M.D.   On: 09/04/2022 03:27    Assessment/Plan: CBI off for about 24 hours.  Will discontinue at this time.  Clear urine on assessment.  Can utilize condom catheter if necessary. Foley catheter remain in place for about 7 days total before considering voiding trial.  Patient would have a higher chance of success if he was at least able to sit in recliner and having regular bowel movements.  We can do his voiding trial in the clinic or consider trialing in hospital if necessary.  Please reach out if he is here for another week. Can restart blood thinner within 48 hours of resolution of hematuria. Urology will sign off at this time.  Please feel free to contact us with any questions or concerns.

## 2022-09-05 NOTE — Progress Notes (Signed)
Pharmacy Antibiotic Note  [name] is a 81 y.o. male admitted on 08/31/2022 with pneumonia.  Today cefepime discontinued and  Pharmacy has been consulted to dose Unasyns for aspiration PNA.    dosing.  Plan: Unasyn 3 gm IV q6hr F/u cultures, renal function and clinical course.  Height: 6' (182.9 cm) Weight: 75.9 kg (167 lb 5.3 oz) IBW/kg (Calculated) : 77.6  Temp (24hrs), Avg:98 F (36.7 C), Min:97.5 F (36.4 C), Max:99.3 F (37.4 C)  Recent Labs  Lab 08/31/22 2020 08/31/22 2122 08/31/22 2245 09/01/22 0131 09/01/22 0344 09/02/22 0034 09/03/22 0125 09/03/22 1455 09/04/22 0015 09/05/22 0034  WBC 9.2  --   --  10.1  --  10.6* 7.4 8.6 12.6* 11.9*  CREATININE 1.31*   < >  --  1.09  --  0.75 0.88  --  0.90 0.93  LATICACIDVEN 2.3*  --  2.6* 3.6* 1.9  --   --   --   --   --    < > = values in this interval not displayed.     Estimated Creatinine Clearance: 64.6 mL/min (by C-G formula based on SCr of 0.93 mg/dL).    Allergies  Allergen Reactions   Adhesive [Tape] Other (See Comments)    Very sensitive skin, tears easily   Codeine Sulfate Itching and Other (See Comments)    Headaches    Hydrocodone-Acetaminophen Nausea And Vomiting and Other (See Comments)    Headaches    Wound Dressing Adhesive Other (See Comments)    Very sensitive skin, tears easily    Antimicrobials this admission: Cefdinir 5/27>5/28 Cefepime 5/28>5/29 Unasyn 5/29>>  Dose adjustments this admission:  Microbiology results: 5/24 BCx > GPR 1 bottle, pending 5/25 BCx > negative 5/25 Ucx:  negative   Thank you for allowing pharmacy to be a part of this patient's care.  Noah Delaine, RPh Clinical Pharmacist  09/05/2022 9:01 AM  [facility] pharmacy phone numbers are listed on [email or web address]

## 2022-09-05 NOTE — Progress Notes (Signed)
OT Cancellation Note  Patient Details Name: [name] MRN: [id number] DOB: July 28, 1940   Cancelled Treatment:    Reason Eval/Treat Not Completed: Medical issues which prohibited therapy (Pt with increased confusion and lethargy with plan for head CT per RN. Will continue efforts.)  Evern Bio 09/05/2022, 8:45 AM Berna Spare, OTR/L Acute Rehabilitation Services Office: [phone]

## 2022-09-05 NOTE — Consult Note (Addendum)
WOC Nurse Consult Note: Refer to initial WOC consult performed on 5/27 for other wounds.  Consult requested today for bilat feet.  Right outer great toe with dry callous; 1X.5cm, raised above skin level, cracked and yellow, no open wound requiring topical treatment. Left plantar foot with intact clear fluid filled blister; Stage 2 pressure injury. 6X4cm, dark red inner base.  Pt has Prevalon boots already in place to reduce pressure. Topical treatment orders provided for bedside nurses to perform as follows to promote healing when the blister eventually ruptures: Apply Xeroform gauze to left plantar foot Q day, then cover with foam dressing. Change foam dressing Q 3 days or PRN soiling. Please re-consult if further assistance is needed.  Thank-you,  Cammie Mcgee MSN, RN, CWOCN, [place], CNS [phone]

## 2022-09-05 NOTE — Progress Notes (Signed)
IR was requested for image guided T9 KP.   Case was reviewed by Dr. Corliss Skains and anatomy is amenable for KP, insurance approval has been obtained and informed consent was obtained from patient's son. However, patient developed leukocytosis on 5/28, tachycardic at times, currently being treated for possible UTI and/or aspiration PNA.    No plan for KP in the near future, IR will continue to follow the patient remotely and determine when KP can be performed.    Please call IR for questions and concerns.   Lynann Bologna Blessed Cotham PA-C 09/05/2022 9:38 AM

## 2022-09-06 ENCOUNTER — Inpatient Hospital Stay (HOSPITAL_COMMUNITY): Payer: Medicare Other

## 2022-09-06 DIAGNOSIS — N179 Acute kidney failure, unspecified: Secondary | ICD-10-CM | POA: Diagnosis not present

## 2022-09-06 DIAGNOSIS — L8990 Pressure ulcer of unspecified site, unspecified stage: Secondary | ICD-10-CM | POA: Insufficient documentation

## 2022-09-06 DIAGNOSIS — R569 Unspecified convulsions: Secondary | ICD-10-CM

## 2022-09-06 DIAGNOSIS — J9601 Acute respiratory failure with hypoxia: Secondary | ICD-10-CM

## 2022-09-06 DIAGNOSIS — R400 Somnolence: Secondary | ICD-10-CM | POA: Diagnosis not present

## 2022-09-06 DIAGNOSIS — R4182 Altered mental status, unspecified: Secondary | ICD-10-CM

## 2022-09-06 DIAGNOSIS — R627 Adult failure to thrive: Secondary | ICD-10-CM | POA: Diagnosis not present

## 2022-09-06 DIAGNOSIS — I255 Ischemic cardiomyopathy: Secondary | ICD-10-CM

## 2022-09-06 LAB — CBC
HCT: 38.6 % — ABNORMAL LOW (ref 39.0–52.0)
Hemoglobin: 12.7 g/dL — ABNORMAL LOW (ref 13.0–17.0)
MCH: 32.8 pg (ref 26.0–34.0)
MCHC: 32.9 g/dL (ref 30.0–36.0)
MCV: 99.7 fL (ref 80.0–100.0)
Platelets: 266 10*3/uL (ref 150–400)
RBC: 3.87 MIL/uL — ABNORMAL LOW (ref 4.22–5.81)
RDW: 12.9 % (ref 11.5–15.5)
WBC: 13.8 10*3/uL — ABNORMAL HIGH (ref 4.0–10.5)
nRBC: 0 % (ref 0.0–0.2)

## 2022-09-06 LAB — BASIC METABOLIC PANEL
Anion gap: 12 (ref 5–15)
BUN: 38 mg/dL — ABNORMAL HIGH (ref 8–23)
CO2: 19 mmol/L — ABNORMAL LOW (ref 22–32)
Calcium: 9 mg/dL (ref 8.9–10.3)
Chloride: 117 mmol/L — ABNORMAL HIGH (ref 98–111)
Creatinine, Ser: 1.01 mg/dL (ref 0.61–1.24)
GFR, Estimated: >60 mL/min (ref >60)
Glucose, Bld: 127 mg/dL — ABNORMAL HIGH (ref 70–99)
Potassium: 4.7 mmol/L (ref 3.5–5.1)
Sodium: 148 mmol/L — ABNORMAL HIGH (ref 135–145)

## 2022-09-06 LAB — BLOOD GAS, ARTERIAL
Acid-base deficit: 2.3 mmol/L — ABNORMAL HIGH (ref 0.0–2.0)
Bicarbonate: 22.5 mmol/L (ref 20.0–28.0)
Drawn by: 33176
O2 Saturation: 100 %
Patient temperature: 37.6
pCO2 arterial: 39 mmHg (ref 32–48)
pH, Arterial: 7.37 (ref 7.35–7.45)
pO2, Arterial: 330 mmHg — ABNORMAL HIGH (ref 83–108)

## 2022-09-06 LAB — GLUCOSE, CAPILLARY
Glucose-Capillary: 132 mg/dL — ABNORMAL HIGH (ref 70–99)
Glucose-Capillary: 143 mg/dL — ABNORMAL HIGH (ref 70–99)
Glucose-Capillary: 125 mg/dL — ABNORMAL HIGH (ref 70–99)
Glucose-Capillary: 122 mg/dL — ABNORMAL HIGH (ref 70–99)
Glucose-Capillary: 99 mg/dL (ref 70–99)

## 2022-09-06 MED ORDER — LACTATED RINGERS IV SOLN: INTRAVENOUS | Status: DC

## 2022-09-06 MED ORDER — MORPHINE SULFATE (PF) 2 MG/ML IV SOLN
1.0000 mg | INTRAVENOUS | Status: DC | PRN
  Administered 2022-09-06 – 2022-09-09 (×4): 2 mg via INTRAVENOUS
  Filled 2022-09-06 (×7): qty 1

## 2022-09-06 MED ORDER — METOPROLOL TARTRATE 5 MG/5ML IV SOLN
5.0000 mg | INTRAVENOUS | Status: DC | PRN
  Administered 2022-09-06 – 2022-09-08 (×5): 5 mg via INTRAVENOUS
  Filled 2022-09-06 (×5): qty 5

## 2022-09-06 MED ORDER — AMIODARONE HCL IN DEXTROSE 360-4.14 MG/200ML-% IV SOLN
60.0000 mg/h | INTRAVENOUS | Status: AC
  Administered 2022-09-06: 60 mg/h via INTRAVENOUS
  Filled 2022-09-06: qty 200

## 2022-09-06 MED ORDER — AMIODARONE HCL IN DEXTROSE 360-4.14 MG/200ML-% IV SOLN
30.0000 mg/h | INTRAVENOUS | Status: DC
  Administered 2022-09-06 – 2022-09-10 (×9): 30 mg/h via INTRAVENOUS
  Filled 2022-09-06 (×9): qty 200

## 2022-09-06 NOTE — Progress Notes (Signed)
OT Cancellation Note  Patient Details Name: [name] MRN: [id number] DOB: October 04, 1940   Cancelled Treatment:    Reason Eval/Treat Not Completed: Medical issues which prohibited therapy.  Decreased sats and placed on Bipap, continue as appropriate.  Shereena Berquist D Johnanna Bakke 09/06/2022, 9:45 AM 09/06/2022  RP, OTR/L  Acute Rehabilitation Services  Office:  [phone]

## 2022-09-06 NOTE — TOC Progression Note (Signed)
Transition of Care [facility]) - Progression Note    Patient Details  Name: [name] MRN: [id number] Date of Birth: January 14, 1941  Transition of [facility]) CM/SW Contact  Carley Hammed, LCSW Phone Number: 09/06/2022, 1:21 PM  Clinical Narrative:     CSW spoke with Palliative care and RN and was advised of concern regarding APS worker and the interactions with the pt's family. APS worker left a VM on the sons phone informing him that palliative was changing the pt's code status. When Palliative spoke with APS worker to advise this was not the case and that sharing this information was inappropriate, APS worker argued that son was not the legal decision maker. Palliative noted concern with interaction and stated the family was very upset. Per RN she had seen APS worker at bedside and had given her a brief review of pt's status, but in no way had advised her to discuss a pt's code status. CSW called and spoke with APS Supervisor Sande Rives [phone]) and advised her of the situation and concerns regarding APS worker conduct. Shawna Orleans stated it would be addressed. CSW spoke with pt's son and advised him that the [facility] team would not make any critical changes without addressing it with the family. CSW also advised that son is pt's decision maker as pt's spouse is also unable to make decisions at this time. Son stated understanding and expressed gratitude for clarification  and the teams efforts. CSW advised staff that any APS issues or contact should be directed through CSW at this time. TOC will be available for further needs.   Expected Discharge Plan: Skilled Nursing Facility Barriers to Discharge: Continued Medical Work up  Expected Discharge Plan and Services In-house Referral: Clinical Social Work     Living arrangements for the past 2 months: Single Family Home                                       Social Determinants of Health (SDOH) Interventions SDOH Screenings    Food Insecurity: No Food Insecurity (12/05/2021)  Housing: Low Risk  (12/05/2021)  Transportation Needs: No Transportation Needs (12/05/2021)  Alcohol Screen: Low Risk  (12/05/2021)  Depression (PHQ2-9): Low Risk  (04/10/2022)  Financial Resource Strain: Low Risk  (12/05/2021)  Physical Activity: Sufficiently Active (12/05/2021)  Social Connections: Socially Integrated (12/05/2021)  Stress: No Stress Concern Present (12/05/2021)  Tobacco Use: High Risk (08/31/2022)    Readmission Risk Interventions     No data to display

## 2022-09-06 NOTE — Progress Notes (Signed)
SLP Cancellation Note  Patient Details Name: [name] MRN: [id number] DOB: Sep 09, 1940   Cancelled treatment:        Micah Flesher to check on pt for swallow and noted he had a change in status this morning. RN stated decreased sats and placed on Bipap now and made NPO. ST will continue to follow status.   [name] 09/06/2022, 9:18 AM

## 2022-09-06 NOTE — Consult Note (Signed)
Consultation Note Date: 09/06/2022   Patient Name: [name]  DOB: 01/20/1941  MRN: [id number]  Age / Sex: 82 y.o., male  PCP: Plotnikov, [name], MD Referring Physician: Rolly Salter, MD  Reason for Consultation: Establishing goals of care and Pain control  HPI/Patient Profile: 82 y.o. male  with past medical history of A-Fib on Eliquis, COPD, CAD s/p CABG x 4, PVD with bilateral stents, TIA, macular degeneration, stage II decubitus ulcer admitted on 08/31/2022 after found down at home after fall with altered mental status.   Patient is admitted with acute metabolic encephalopathy, A-fib with RVR, AKI, nontraumatic rhabdomyolysis, worsening odontoid fracture, and sepsis likely secondary to UTI. PMT has been consulted to assist with acute on chronic pain management of fracture as well as goals of care conversation.  Clinical Assessment and Goals of Care:  I have reviewed medical records including EPIC notes, labs and imaging, received report from RN, assessed the patient and then met at the bedside with patient's son Dukeand daughter-in-law to discuss diagnosis prognosis, GOC, EOL wishes, disposition and options.  I introduced Palliative Medicine as specialized medical care for people living with serious illness. It focuses on providing relief from the symptoms and stress of a serious illness. The goal is to improve quality of life for both the patient and the family.  We discussed a brief life review of the patient and then focused on their current illness.   I attempted to elicit values and goals of care important to the patient.    Medical History Review and Understanding:  Patient's son has a good understanding severity of patient's illness.  Patient's mental status is still fluctuating with agitation due to pain.  Social History: Patient lives at home where he cares for his wife who has dementia.  Son reports that patient has adamantly  rejected any assistance family has offered.  Patient has 3 sons and the closest is 2, who lives in [place].  His other sons live in [place] and [place].  Functional and Nutritional State: Patient's son reports patient has been cognitively declining since January. He reports physical/functional decline for the past year, since his fall a year ago that resulted in his initial C2 fracture. Functional decline has progressed even faster over the past 2 months.  Palliative Symptoms: Neck pain, bilateral shoulder pain  Discussion: Emotional support and therapeutic listening was provided as patient's son and daughter-in-law shared details about the difficult time they have had trying to convince patient to allow them to assist him.  His sole purpose in life is to take care of his wife and he is going to be very upset when he realizes that she is going to SNF after her discharge in the coming day or 2.  They also feel like it would be asked if he could go to the same SNF; they hope that both patient and his wife could go to a facility closer to [place] so they can check on them more frequently.  Patient's son shares the nonverbal signs of pain that he is witnessed, as patient has not made any mention of his pain to the nurse.  We discussed options for pain management including nonpharmacological interventions, Tylenol, tramadol, and IV morphine as needed.  Patient's son confirms that they would be interested in possible kyphoplasty for further relief as well.  They are hopeful that patient can participate in goals of care discussions when he is less agitated by his pain.  We discussed that if pain  cannot be adequately controlled and his quality of life continues to decline, that he may be appropriate for hospice.  They verbalized her understanding. Patient is conversive today, though unable to tell me details about any pain medicines he has tried in the past.  He states his pain is currently a 9 out of  10.  I briefly discussed with him the importance of planning for the worst while hoping for the best and he agrees with this concept.   Discussed the importance of continued conversation with family and the medical providers regarding overall plan of care and treatment options, ensuring decisions are within the context of the patient's values and GOCs.   Questions and concerns were addressed.  Hard Choices booklet left for review. The family was encouraged to call with questions or concerns.  PMT will continue to support holistically.   SUMMARY OF RECOMMENDATIONS   -Continue full code/full scope treatment for now -Ongoing GOC discussions as patient is able once pain is better controlled -Ordered K pad and lidocaine pain for topical pain relief  -Increased frequency of tramadol 50 mg p.o. to every 4 hours as needed -Ordered IV morphine 1 mg every 4 hours as needed for severe pain uncontrolled by p.o. meds -Spoke with wife's RN on 5N to attempt to coordinate a visit to the patient -Psychosocial and emotional support provided -PMT will continue to follow and support  Prognosis:  Unable to determine  Discharge Planning: To Be Determined      Primary Diagnoses: Present on Admission:  COPD (chronic obstructive pulmonary disease) (HCC)  PVD (peripheral vascular disease) with claudication (HCC)  Nicotine dependence  Ischemic cardiomyopathy  Odontoid fracture (HCC)  Chronic atrial fibrillation with RVR (HCC)  Rhabdomyolysis  AKI (acute kidney injury) (HCC)  AMS (altered mental status)  Abnormal LFTs  Sepsis secondary to UTI [facility])  Physical Exam Vitals and nursing note reviewed.  Constitutional:      General: He is not in acute distress.    Appearance: He is ill-appearing.  Cardiovascular:     Rate and Rhythm: Normal rate.  Pulmonary:     Effort: Pulmonary effort is normal.  Skin:    General: Skin is warm and dry.  Neurological:     Mental Status: He is alert.    Vital  Signs: BP (!) 128/93   Pulse (!) 112   Temp 99.6 F (37.6 C) (Axillary)   Resp (!) 36   Ht 6' (1.829 m)   Wt 75.4 kg   SpO2 97%   BMI 22.54 kg/m  Pain Scale: Faces POSS *See Group Information*: 1-Acceptable,Awake and alert Pain Score: Asleep   SpO2: SpO2: 97 % O2 Device:SpO2: 97 % O2 Flow Rate: .O2 Flow Rate (L/min): 2 L/min   Palliative Assessment/Data: tbd     MDM: high   Siobhan Zaro Jeni Salles, PA-C  Palliative Medicine Team Team phone # [phone]  Thank you for allowing the Palliative Medicine Team to assist in the care of this patient. Please utilize secure chat with additional questions, if there is no response within 30 minutes please call the above phone number.  Palliative Medicine Team providers are available by phone from 7am to 7pm daily and can be reached through the team cell phone.  Should this patient require assistance outside of these hours, please call the patient's attending physician.

## 2022-09-06 NOTE — Progress Notes (Signed)
[facility] Progress Note Patient: ALGENE GLAWE ZOX:[id number] DOB: April 18, 1940 DOA: 08/31/2022  DOS: the patient was seen and examined on 09/06/2022  Brief hospital course: past medical history of A-Fib on Eliquis, COPD, CAD s/p CABG x 4, PVD with bilateral stents, TIA, macular degeneration, stage II decubitus ulcer admitted on 08/31/2022 after found down at home after fall with altered mental status.  Found to have C2 fracture, T9 fracture.  Also had elevated troponin for which patient was started on IV heparin but then developed hematuria secondary to Foley trauma now requiring CBI.  Also found to have SDH. Cardiology, palliative care, IR, neurosurgery, urology consulted. Assessment and Plan: Unwitnessed fall. Acute metabolic encephalopathy. Ongoing confusion.  Progressive lethargy. Metabolic workup so far shows mild AKI.  Possible concussion cannot be ruled out. Repeat CT scan on 5/29 shows SDH although would not explain patient's progressive lethargy ongoing for last few days. Possible cefepime induced encephalopathy cannot be ruled out as well. Monitor for now.  Concern for sepsis.  Ruled out. Patient had lactic acidosis at the time of admission and there was some concern that he could have an aspiration event with sepsis. Speech therapy following the patient. Chest x-ray does not show any significant pneumonia. Currently on IV cefepime.  I will switch to IV Unasyn.  Monitor.  Unsure if there is any benefit of continuing the antibiotic given negative procalcitonin but will complete a 7-day treatment course.  C2 fracture. SDH. Discussed with neurosurgery. Recommend conservative measures. No intervention recommended.  T9 fracture. IR consulted.  Kyphoplasty recommended.  Will monitor.  Elevated troponin. Acute systolic CHF. Echocardiogram shows EF of 45 to 50%. Troponins were minimally elevated without any ACS pattern. EKG unremarkable. Patient unable to tell me  whether he has any angina symptoms or not. Less likely this is ACS.  Most likely this is related to his rhabdomyolysis. Patient was treated with IV heparin which is now discontinued due to ongoing hematuria. Monitor. Eventual plan is to transition to Eliquis.  Treating conservatively.  Paroxysmal A-fib. On metoprolol.  No response. Unable to tolerate p.o. and therefore patient will be on IV amiodarone.  Not okay for Cardizem due to low EF.  Rhabdomyolysis. Nontraumatic. Treated with IV fluid. Monitor.  Gross hematuria. From Foley trauma. Patient was trying to pull on his Foley catheter while receiving IV heparin. CBI recommended. Monitor.  PVD. Will resume Plavix once appropriate given his SDH.  Acute hypoxia. Etiology not clear.  Patient developed significant hypoxia requiring NRB and later on requiring BiPAP on 5/30. Chest x-ray unremarkable. ABG shows hypoxia only without any hypercarbia. Metabolic workup already has been unremarkable. No evidence of seizures or aspiration noted. Patient currently remains n.p.o. until fully awake and able to come off of the BiPAP for at least 30 minutes. Continue BiPAP as needed. Possibility of a PE cannot be ruled out although not a candidate for anticoagulation given his SDH and hematuria.  Given his mild renal dysfunction not a candidate for contrast study as that will deteriorated his condition further without change in management. Monitor for now.  Goals of care conversation. Patient's prognosis is poor. Oral intake is minimal. Discussed with son at bedside. Patient does not have any HCPOA documentation. Son [name] currently agreeable for DNR and is in the process of discussing with rest of the family members to gather their agreement as well. Will monitor. Palliative care consulted.  Left ischial tuberosity pressure ulcer stage I, unstageable medial coccyx pressure injury, left foot stage II pressure  injury. Present on  admission. Continue home dressing.   Subjective: Patient become more lethargic and hypoxic this morning.  Later on after being on BiPAP throughout the day mentation improving.  No nausea or vomiting.  Physical Exam: In mild distress.  No rash. S1-S2 present. Bilateral breath sounds present although left foot is significantly diminished. Bowel sound present. No edema. Unable to follow any commands but able to respond to verbal stimuli as well as painful stimuli.  Pupils are equal and reactive to light.  Spontaneous movement of all extremities seen.  Data Reviewed: I have Reviewed nursing notes, Vitals, and Lab results. Since last encounter, pertinent lab results CBC and BMP   . I have ordered test including CBC and BMP EEG  . I have discussed pt's care plan and test results with palliative care  . I have ordered imaging chest x-ray  .    Disposition: Status is: Inpatient Remains inpatient appropriate because: Need for further therapy and goals of care discussion  Family Communication: Discussed with son on phone.  Level of care: Progressive   Vitals:   09/06/22 1400 09/06/22 1500 09/06/22 1600 09/06/22 1715  BP: 127/77 130/76 133/82   Pulse: 100 93 93   Resp: (!) 26 19 (!) 26   Temp:  99.3 F (37.4 C)    TempSrc:  Axillary    SpO2: 100% 100% 100% 100%  Weight:      Height:         Author: Lynden Oxford, MD 09/06/2022 5:58 PM  Please look on [email or web address] to find out who is on call.

## 2022-09-06 NOTE — Progress Notes (Signed)
Nutrition Follow-up  DOCUMENTATION CODES:   Non-severe (moderate) malnutrition in context of chronic illness  INTERVENTION:   - Nutrition recommendations pending ongoing GOC discussions; pt is currently NPO on BiPAP with baseline poor PO intake and malnutrition  NUTRITION DIAGNOSIS:   Moderate Malnutrition related to chronic illness (CKD, COPD) as evidenced by moderate fat depletion, severe muscle depletion.  Ongoing  GOAL:   Patient will meet greater than or equal to 90% of their needs  Unmet at this time  MONITOR:   PO intake, Supplement acceptance, Labs, Weight trends, Skin  REASON FOR ASSESSMENT:   Consult Wound healing, Poor PO  ASSESSMENT:   82 year old male who presented to the ED on 5/24 after a fall. PMH of CAD s/p CABG x 4, TIA, CKD stage II, COPD, HLD, PVD. Pt admitted with severe sepsis, rhabdomyolysis, C2 fracture.  05/25 - diet advanced to dysphagia 3 with thin liquids 05/26 - diet downgraded to dysphagia 3 with nectar-thick liquids 05/29 - diet downgraded to dysphagia 2 with nectar-thick liquids 05/30 - placed on BiPAP, NPO  RD consulted for poor PO intake and wound healing. Pt is now NPO on BiPAP and unable to interact with RD at time of visit.  Per Palliative Medicine note, plan for discussions today with family regarding GOC.  Nutrition recommendations pending ongoing GOC discussions; pt is currently NPO on BiPAP with baseline poor PO intake and malnutrition.  Medications reviewed and include: IV thiamine 100 mg daily, amiodarone drip, IV abx IVF: LR @ 75 ml/hr  Labs reviewed: sodium 148, chloride 117, BUN 38, WBC 13.8 CBG's: 124-143 x 24 hours  Diet Order:   Diet Order             Diet NPO time specified  Diet effective now                   EDUCATION NEEDS:   Not appropriate for education at this time  Skin:  Skin Assessment: Skin Integrity Issues: Stage I: L ischial tuberosity Stage II: L foot Unstageable: coccyx  Last  BM:  09/05/22  Height:   Ht Readings from Last 1 Encounters:  09/01/22 6' (1.829 m)    Weight:   Wt Readings from Last 1 Encounters:  09/06/22 75.4 kg    BMI:  Body mass index is 22.54 kg/m.  Estimated Nutritional Needs:   Kcal:  1800-2000  Protein:  85-100 grams  Fluid:  1.8-2.0 L    Mertie Clause, MS, RD, LDN Inpatient Clinical Dietitian Please see [facility] for contact information.

## 2022-09-06 NOTE — Progress Notes (Signed)
   09/06/22 0750  Vent Select  Invasive or Noninvasive Noninvasive  Adult Vent Y  Adult Ventilator Settings  Vent Type Servo i  Vent Mode BIPAP  FiO2 (%) 100 %  IPAP 12 cmH20  EPAP 6 cmH20  Pressure Support 6 cmH20  PEEP 6 cmH20  Adult Ventilator Measurements  Peak Airway Pressure 12 L/min  Mean Airway Pressure 8 cmH20  Resp Rate Spontaneous 36 br/min  Spont TV 572 mL  Measured Ve 19.8 L  Adult Ventilator Alarms  Alarms On Y  Ve High Alarm 24 L/min  Ve Low Alarm 5 L/min  Resp Rate High Alarm 42 br/min  Resp Rate Low Alarm 10  PEEP Low Alarm 3 cmH2O  Press High Alarm 20 cmH2O  T Apnea 20 sec(s)  VAP Prevention  HOB> 30 Degrees Y  Breath Sounds  Bilateral Breath Sounds Diminished  Vent Respiratory Assessment  Respiratory Pattern Labored;Tachypnea

## 2022-09-06 NOTE — Progress Notes (Signed)
During Shift change report it was noted that the pts Oxygen had dropped to 83% while on 2L. O2 was turned up to 5 with no improvement. A non-rebreather was placed on pt and turned all the way up. Pt was still stating in the 80's. Rapid response was called along with the MD. Pt was lethargic and only responding to pain.   09/06/22 0736  Assess: MEWS Score  BP (!) 128/93  MAP (mmHg) 106  Pulse Rate (!) 113  ECG Heart Rate (!) 134  Resp (!) 31  Level of Consciousness Responds to Voice  SpO2 (!) 87 %  O2 Device Nasal Cannula  Patient Activity (if Appropriate) In bed  O2 Flow Rate (L/min) 2 L/min  Assess: MEWS Score  MEWS Temp 0  MEWS Systolic 0  MEWS Pulse 3  MEWS RR 2  MEWS LOC 1  MEWS Score 6  MEWS Score Color Red  Assess: if the MEWS score is Yellow or Red  Were vital signs taken at a resting state? Yes  Focused Assessment Change from prior assessment (see assessment flowsheet)  Does the patient meet 2 or more of the SIRS criteria? Yes  Does the patient have a confirmed or suspected source of infection? Yes  MEWS guidelines implemented  Yes, red  Treat  MEWS Interventions Considered administering scheduled or prn medications/treatments as ordered  Take Vital Signs  Increase Vital Sign Frequency  Red: Q1hr x2, continue Q4hrs until patient remains green for 12hrs  Escalate  MEWS: Escalate Red: Discuss with charge nurse and notify provider. Consider notifying RRT. If remains red for 2 hours consider need for higher level of care  Notify: Charge Nurse/RN  Name of Charge Nurse/RN Notified [place], RN  Provider Notification  Provider Name/Title Lynden Oxford, MD  Date Provider Notified 09/06/22  Time Provider Notified [phone]  Method of Notification Page  Notification Reason Change in status  Provider response At bedside;See new orders  Date of Provider Response 09/06/22  Time of Provider Response 0740  Notify: Rapid Response  Name of Rapid Response RN Notified Helen, RN  Date  Rapid Response Notified 09/06/22  Time Rapid Response Notified 0734  Assess: SIRS CRITERIA  SIRS Temperature  0  SIRS Pulse 1  SIRS Respirations  1  SIRS WBC 1  SIRS Score Sum  3

## 2022-09-06 NOTE — Significant Event (Signed)
Rapid Response Event Note   Reason for Call :  Desat, respiratory distress  Initial Focused Assessment:  Patient is responsive to pain. He will moan and groan, and follow a few commands.  Moves all extremities weakly  He is using accessory muscles to breath.   BP 128/93  HR 110-130s  RR 36  O2 sat 80s on NRB. Lung sounds decreased on the left   Interventions:  Placed on Bipap 12/6 100%  O2 sats improved to 94% PCXR  Plan of Care:  ABG in an hour RN to call if respiratory distress does not improve or if he worsens   Event Summary:   MD Notified:  Dr Lynden Oxford at bedside Call Time: 0736 Arrival Time: 1610 End Time: 0840  Marcellina Millin, RN

## 2022-09-06 NOTE — Progress Notes (Signed)
Pt taken off Bipap to give a break. Pt placed on 2lpm Prien, Sp02 99%. Pt is alert and able to state his name.

## 2022-09-06 NOTE — Progress Notes (Signed)
Daily Progress Note   Patient Name: [name]       Date: 09/06/2022 DOB: May 06, 1940  Age: 82 y.o. MRN#: [id number] Attending Physician: Rolly Salter, MD Primary Care Physician: Tresa Garter, MD Admit Date: 08/31/2022  Reason for Consultation/Follow-up: Establishing goals of care and Pain control  Patient Profile/HPI:  82 y.o. male  with past medical history of A-Fib on Eliquis, COPD, CAD s/p CABG x 4, PVD with bilateral stents, TIA, macular degeneration, stage II decubitus ulcer admitted on 08/31/2022 after found down at home after fall with altered mental status.    Patient is admitted with acute metabolic encephalopathy, A-fib with RVR, AKI, nontraumatic rhabdomyolysis, worsening odontoid fracture, and sepsis likely secondary to UTI. PMT has been consulted to assist with acute on chronic pain management of fracture as well as goals of care conversation.  Subjective: Chart reviewed including labs, progress notes, imaging from this and previous encounters.  Evaluated patient.  Unfortunately, his status is worsening. He remains lethargic and is now requiring bipap for respiratory support.  I spoke with son [name]. Discussed options of ongoing medical interventions and also discussed comfort measures. Discussed transition to comfort measures only which includes stopping IV fluids, antibiotics, labs and providing symptom management for SOB, anxiety, nausea, vomiting, and other symptoms of dying.  [name] spoke with his brothers and has decided on DNR code status, continue medical interventions and reassess in the next 24 hours and consider comfort measures if his status does not improve.   Review of Systems  Unable to perform ROS: Mental status change     Physical Exam Vitals  and nursing note reviewed.  Constitutional:      General: He is not in acute distress.    Appearance: He is ill-appearing.  Cardiovascular:     Rate and Rhythm: Normal rate.  Neurological:     Comments: sleepy             Vital Signs: BP 138/81 (BP Location: Left Arm)   Pulse 89   Temp 98.8 F (37.1 C) (Axillary)   Resp (!) 22   Ht 6' (1.829 m)   Wt 75.4 kg   SpO2 100%   BMI 22.54 kg/m  SpO2: SpO2: 100 % O2 Device: O2 Device: Bi-PAP O2 Flow Rate: O2 Flow Rate (L/min): 2 L/min  Intake/output summary:  Intake/Output Summary (Last 24 hours) at 09/06/2022 1542 Last data filed at 09/06/2022 0600 Gross per 24 hour  Intake 1121.28 ml  Output 300 ml  Net 821.28 ml    LBM: Last BM Date : 09/05/22 Baseline Weight: Weight: 77.1 kg Most recent weight: Weight: 75.4 kg       Palliative Assessment/Data: PPS: 10%       Patient Active Problem List   Diagnosis Date Noted   Pressure injury of skin 09/06/2022   Elevated troponin 09/05/2022   Malnutrition of moderate degree 09/04/2022   PAF (paroxysmal atrial fibrillation) (HCC) 09/02/2022   Chronic atrial fibrillation with RVR (HCC) 09/01/2022   Rhabdomyolysis 09/01/2022   AKI (acute kidney injury) (HCC) 09/01/2022   AMS (altered mental status) 09/01/2022   Abnormal LFTs 09/01/2022   Sepsis secondary to UTI (HCC) 09/01/2022   Sensorineural hearing loss 04/10/2022   Tinnitus 04/10/2022   Edema 12/20/2021   Cellulitis 12/20/2021   Odontoid fracture (HCC) 12/07/2021   Fall 12/07/2021   Atherosclerotic heart disease of native coronary artery with unspecified angina pectoris (HCC) 10/23/2021   Nicotine dependence 10/23/2021   Head trauma 10/23/2021   On anticoagulant therapy 10/23/2021   Injury of neck 10/23/2021   Vision abnormalities 10/23/2021   Fall from slipping on ice 05/03/2020   Acute hand eczema 11/16/2019   Impetigo 11/16/2019   Bursitis of left elbow 01/07/2019   Trigger finger 08/22/2017   Insomnia  12/19/2015   Prostate cancer (HCC) 12/19/2015   Other bursitis of elbow, right elbow 06/25/2015   Epiretinal membrane (ERM) of right eye 01/18/2015   Exudative macular degeneration (HCC) 01/18/2015   Pseudophakia of both eyes 01/18/2015   Hypokalemia 06/29/2014   PVD (peripheral vascular disease) with claudication (HCC)    COPD (chronic obstructive pulmonary disease) (HCC)    Hyponatremia    Nausea with vomiting    Orthostatic hypotension 06/22/2014   Dehydration 06/22/2014   Atrial flutter (HCC) 06/22/2014   Pain in joint, shoulder region 06/14/2014   Acute blood loss anemia 06/09/2014   Hypertensive heart disease 06/09/2014   Hematuria 05/31/2014   S/P CABG x 4 05/28/2014   Chronic anticoagulation-on Eliquis at discharge 05/11/14 05/25/2014   Ischemic cardiomyopathy 05/25/2014   Protein-calorie malnutrition, severe (HCC) 05/25/2014   Encephalopathy 05/14/2014   Acute respiratory acidosis (HCC)    S/P CABG x 2 05/04/2014   Acute respiratory failure (HCC)    STEMI 05/04/14 with shock, CPR, CHB, VT 04/30/2014   Cardiogenic shock (HCC) 04/30/2014   Cardiac arrest (HCC) 04/30/2014   VT (ventricular tachycardia) (HCC) 04/30/2014   CHB (complete heart block) (HCC) 04/30/2014   CAD S/P LM and CFX PCI 05/04/14 04/30/2014   Wart 04/18/2014   Laceration of index finger 12/04/2012   Hand eczema 05/26/2012   Bruising 02/20/2012   Nuclear cataract 12/26/2011   Pseudophakia 12/26/2011   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   Actinic keratoses 11/14/2011   Constipation 07/18/2011   Sinusitis, acute 04/18/2011   Ventral hernia 01/03/2011   SWEATING 06/26/2010   History of transient ischemic attack (TIA)    CERUMEN IMPACTION 02/20/2010   DIVERTICULOSIS-COLON 11/01/2009   RECTAL BLEEDING 11/01/2009   ABDOMINAL BLOATING 11/01/2009   ABNORMAL BOWEL SOUNDS 11/01/2009   ABDOMINAL PAIN-RLQ 11/01/2009   ABDOMINAL PAIN, GENERALIZED 11/01/2009   Rash and nonspecific skin eruption 07/11/2009   TRANSIENT  ISCHEMIC ATTACK, HX OF 03/22/2009   BRONCHITIS, ACUTE 11/26/2008   Personal history of malignant neoplasm of prostate 08/02/2008   ALLERGIC RHINITIS 08/06/2007  ELEVATED PROSTATE SPECIFIC ANTIGEN 08/06/2007   Hyperlipidemia 04/22/2007   Neuropathic pain of both legs 04/22/2007   UPPER RESPIRATORY INFECTION (URI) 04/22/2007   COPD exacerbation (HCC) 04/22/2007   Osteoarthritis 04/22/2007   LOW BACK PAIN 04/22/2007    Palliative Care Assessment & Plan    Assessment/Recommendations/Plan  Continue current care DNR- continue medical interventions Reassess patient's status tomorrow and consider comfort in not improving   Code Status: DNR   Prognosis:  Unable to determine  Thank you for allowing the Palliative Medicine Team to assist in the care of this patient.  Greater than 50%  of this time was spent counseling and coordinating care related to the above assessment and plan.  Ocie Bob, AGNP-C Palliative Medicine   Please contact Palliative Medicine Team phone at [phone] for questions and concerns.

## 2022-09-06 NOTE — Progress Notes (Signed)
EEG complete - results pending 

## 2022-09-06 NOTE — Procedures (Signed)
Patient Name: [name]  MRN: [id number]  Epilepsy Attending: Charlsie Quest  Referring Physician/Provider: Rolly Salter, MD  Date: 09/06/2022 Duration: 23.07 mins  Patient history: 82 year old male with altered mental status getting EEG to evaluate for seizure  Level of alertness: Awake  AEDs during EEG study: None  Technical aspects: This EEG study was done with scalp electrodes positioned according to the 10-20 International system of electrode placement. Electrical activity was reviewed with band pass filter of 1-70Hz , sensitivity of 7 uV/mm, display speed of 85mm/sec with a 60Hz  notched filter applied as appropriate. EEG data were recorded continuously and digitally stored.  Video monitoring was available and reviewed as appropriate.  Description: No posterior dominant rhythm was seen. EEG showed continuous generalized 3 to 6 Hz theta-delta slowing. Hyperventilation and photic stimulation were not performed.     ABNORMALITY - Continuous slow, generalized  IMPRESSION: This study is suggestive of moderate diffuse encephalopathy, nonspecific etiology. No seizures or epileptiform discharges were seen throughout the recording.  [name] [name]

## 2022-09-06 NOTE — Progress Notes (Signed)
Subjective: The patient is tachypneic and somnolent.  Objective: Vital signs in last 24 hours: Temp:  [97.6 F (36.4 C)-98.7 F (37.1 C)] 98.7 F (37.1 C) (05/30 0326) Pulse Rate:  [102-114] 112 (05/30 0750) Resp:  [16-36] 36 (05/30 0750) BP: (122-156)/(66-98) 128/93 (05/30 0750) SpO2:  [93 %-97 %] 97 % (05/30 0326) FiO2 (%):  [100 %] 100 % (05/30 0750) Weight:  [75.4 kg] 75.4 kg (05/30 0500) Estimated body mass index is 22.54 kg/m as calculated from the following:   Height as of this encounter: 6' (1.829 m).   Weight as of this encounter: 75.4 kg.   Intake/Output from previous day: 05/29 0701 - 05/30 0700 In: 1121.3 [IV Piggyback:421.3] Out: 300 [Urine:300] Intake/Output this shift: No intake/output data recorded.  Physical exam the patient is somnolent but arousable.  He will answer simple questions.  He is moving all 4 extremities.  He is tachypneic and hypoxic.  Lab Results: Recent Labs    09/05/22 0034 09/06/22 0033  WBC 11.9* 13.8*  HGB 13.2 12.7*  HCT 39.8 38.6*  PLT 275 266   BMET Recent Labs    09/05/22 0034 09/06/22 0033  NA 144 148*  K 4.9 4.7  CL 115* 117*  CO2 19* 19*  GLUCOSE 131* 127*  BUN 27* 38*  CREATININE 0.93 1.01  CALCIUM 9.1 9.0    Studies/Results: DG Abd Portable 1V  Result Date: 09/05/2022 CLINICAL DATA:  Abdominal pain EXAM: PORTABLE ABDOMEN - 1 VIEW COMPARISON:  None Available. FINDINGS: Scattered large and small bowel gas is noted. No obstructive changes are seen. No free air is seen. Diffuse vascular calcifications are noted. No bony abnormality is seen. IMPRESSION: No obstructive changes noted. Electronically Signed   By: Alcide Clever M.D.   On: 09/05/2022 20:29   DG CHEST PORT 1 VIEW  Result Date: 09/05/2022 CLINICAL DATA:  Shortness of breath EXAM: PORTABLE CHEST 1 VIEW COMPARISON:  09/04/2022 FINDINGS: Cardiac shadow is stable. Aortic calcifications are seen. Postsurgical changes are noted. Improved aeration is noted in the  left base when compare with the prior exam although increasing opacity in the right base is seen. No sizable effusion is noted. No bony abnormality is noted. IMPRESSION: Interval clearing in the left base with developing right basilar opacity which may represent atelectasis and or infiltrate. Electronically Signed   By: Alcide Clever M.D.   On: 09/05/2022 17:29   CT HEAD WO CONTRAST ( )  Addendum Date: 09/05/2022   ADDENDUM REPORT: 09/05/2022 10:09 ADDENDUM: Study discussed by telephone with Dr. Edsel Petrin PATEL on 09/05/2022 at 1004 hours. Electronically Signed   By: Odessa Fleming M.D.   On: 09/05/2022 10:09   Result Date: 09/05/2022 CLINICAL DATA:  82 year old male with altered mental status. EXAM: CT HEAD WITHOUT CONTRAST TECHNIQUE: Contiguous axial images were obtained from the base of the skull through the vertex without intravenous contrast. RADIATION DOSE REDUCTION: This exam was performed according to the departmental dose-optimization program which includes automated exposure control, adjustment of the mA and/or kV according to patient size and/or use of iterative reconstruction technique. COMPARISON:  Head CT 08/31/2022.  Brain MRI 04/21/2009. FINDINGS: Brain: Cerebral volume is within normal limits for age. Is stable and within normal limits. New isodense to hypodense small bilateral vertex subdural hematomas (coronal images 32 through 39) measuring 3-4 mm in thickness bilaterally. No significant intracranial mass effect. No midline shift. No ventriculomegaly. No other intracranial hemorrhage identified. Basilar cisterns remain normal. Gray-white matter differentiation Vascular: No suspicious intracranial vascular hyperdensity. Calcified  atherosclerosis at the skull base. Skull: Stable visualized osseous structures. Sinuses/Orbits: Visualized paranasal sinuses and mastoids are stable and well aerated. Other: Largely resolved scalp hematoma since 08/31/2022. Orbits soft tissues appears stable, negative.  IMPRESSION: 1. Evidence of small new bilateral superior convexity Subdural hematomas, 3-4 mm in thickness. But no intracranial mass effect. No skull fracture identified. And largely resolved scalp hematoma since 08/31/2022. 2. No other acute intracranial abnormality. Electronically Signed: By: Odessa Fleming M.D. On: 09/05/2022 09:46    Assessment/Plan: Bilateral subdural hematomas, cervical fracture: Continue observation for his small subdural hematomas and the hard collar for his type II odontoid fracture.Marland Kitchen  He is not a surgical candidate.  LOS: 5 days     [name] 09/06/2022, 8:14 AM     Patient ID: [name], male   DOB: [birth year], 82 y.o.   MRN: [id number]

## 2022-09-06 NOTE — Progress Notes (Signed)
PT Cancellation Note  Patient Details Name: [name] MRN: [id number] DOB: 1941-03-07   Cancelled Treatment:    Reason Eval/Treat Not Completed: (P) Medical issues which prohibited therapy (Decreased sats and placed on Bipap, continue as appropriate.)   Angus Palms 09/06/2022, 10:25 AM

## 2022-09-07 ENCOUNTER — Inpatient Hospital Stay (HOSPITAL_COMMUNITY): Payer: Medicare Other

## 2022-09-07 DIAGNOSIS — Z7189 Other specified counseling: Secondary | ICD-10-CM | POA: Diagnosis not present

## 2022-09-07 DIAGNOSIS — E440 Moderate protein-calorie malnutrition: Secondary | ICD-10-CM

## 2022-09-07 DIAGNOSIS — M6282 Rhabdomyolysis: Secondary | ICD-10-CM | POA: Diagnosis not present

## 2022-09-07 DIAGNOSIS — R400 Somnolence: Secondary | ICD-10-CM | POA: Diagnosis not present

## 2022-09-07 DIAGNOSIS — N179 Acute kidney failure, unspecified: Secondary | ICD-10-CM | POA: Diagnosis not present

## 2022-09-07 DIAGNOSIS — A419 Sepsis, unspecified organism: Secondary | ICD-10-CM | POA: Diagnosis not present

## 2022-09-07 LAB — BASIC METABOLIC PANEL
Anion gap: 12 (ref 5–15)
BUN: 35 mg/dL — ABNORMAL HIGH (ref 8–23)
CO2: 18 mmol/L — ABNORMAL LOW (ref 22–32)
Calcium: 8.2 mg/dL — ABNORMAL LOW (ref 8.9–10.3)
Chloride: 116 mmol/L — ABNORMAL HIGH (ref 98–111)
Creatinine, Ser: 0.87 mg/dL (ref 0.61–1.24)
GFR, Estimated: >60 mL/min (ref >60)
Glucose, Bld: 101 mg/dL — ABNORMAL HIGH (ref 70–99)
Potassium: 4.1 mmol/L (ref 3.5–5.1)
Sodium: 146 mmol/L — ABNORMAL HIGH (ref 135–145)

## 2022-09-07 LAB — CBC
HCT: 33 % — ABNORMAL LOW (ref 39.0–52.0)
Hemoglobin: 10.8 g/dL — ABNORMAL LOW (ref 13.0–17.0)
MCH: 33.1 pg (ref 26.0–34.0)
MCHC: 32.7 g/dL (ref 30.0–36.0)
MCV: 101.2 fL — ABNORMAL HIGH (ref 80.0–100.0)
Platelets: 213 10*3/uL (ref 150–400)
RBC: 3.26 MIL/uL — ABNORMAL LOW (ref 4.22–5.81)
RDW: 13 % (ref 11.5–15.5)
WBC: 13 10*3/uL — ABNORMAL HIGH (ref 4.0–10.5)
nRBC: 0 % (ref 0.0–0.2)

## 2022-09-07 LAB — GLUCOSE, CAPILLARY
Glucose-Capillary: 116 mg/dL — ABNORMAL HIGH (ref 70–99)
Glucose-Capillary: 117 mg/dL — ABNORMAL HIGH (ref 70–99)
Glucose-Capillary: 121 mg/dL — ABNORMAL HIGH (ref 70–99)
Glucose-Capillary: 123 mg/dL — ABNORMAL HIGH (ref 70–99)
Glucose-Capillary: 130 mg/dL — ABNORMAL HIGH (ref 70–99)
Glucose-Capillary: 132 mg/dL — ABNORMAL HIGH (ref 70–99)

## 2022-09-07 MED ORDER — CHLORHEXIDINE GLUCONATE 0.12 % MT SOLN
15.0000 mL | Freq: Two times a day (BID) | OROMUCOSAL | Status: DC
  Administered 2022-09-07 – 2022-09-10 (×7): 15 mL via OROMUCOSAL
  Filled 2022-09-07 (×5): qty 15

## 2022-09-07 MED ORDER — LACTATED RINGERS IV SOLN: INTRAVENOUS | Status: DC

## 2022-09-07 NOTE — Progress Notes (Signed)
Pt. Refusing to wear bipap

## 2022-09-07 NOTE — Progress Notes (Signed)
[facility] Progress Note Patient: [name] ZOX:[id number] DOB: 11-19-1940 DOA: 08/23/2022  DOS: the patient was seen and examined on 09/07/2022  Brief hospital course: past medical history of A-Fib on Eliquis, COPD, CAD s/p CABG x 4, PVD with bilateral stents, TIA, macular degeneration, stage II decubitus ulcer admitted on 09/04/2022 after found down at home after fall with altered mental status.  Found to have C2 fracture, T9 fracture.  Also had elevated troponin for which patient was started on IV heparin but then developed hematuria secondary to Foley trauma now requiring CBI.  Also found to have SDH. Cardiology, palliative care, IR, neurosurgery, urology consulted. Assessment and Plan: Unwitnessed fall. Acute metabolic encephalopathy. Ongoing confusion.  Progressive lethargy. Metabolic workup so far shows mild AKI.  Possible concussion cannot be ruled out. Repeat CT scan on 5/29 shows SDH although would not explain patient's progressive lethargy ongoing for last few days. Possible cefepime induced encephalopathy cannot be ruled out as well. Mentation better on 5/31. No concern for stroke. If not better by tomorrow will consider MRI brain  Concern for sepsis.  Ruled out. Patient had lactic acidosis at the time of admission and there was some concern that he could have an aspiration event with sepsis. Speech therapy following the patient. Chest x-ray does not show any significant pneumonia. Was on IV cefepime.  Switched to IV Unasyn negative procalcitonin but will complete a 7-day treatment course.  C2 fracture. SDH. Discussed with neurosurgery. Recommend conservative measures. No intervention recommended.  T9 fracture. IR consulted.  Kyphoplasty recommended.  Will monitor.  Elevated troponin. Acute systolic CHF. Echocardiogram shows EF of 45 to 50%. Troponins were minimally elevated without any ACS pattern. EKG unremarkable. Patient unable to tell me whether he  has any angina symptoms or not. Less likely this is ACS.  Most likely this is related to his rhabdomyolysis. Patient was treated with IV heparin which is now discontinued due to ongoing hematuria. Eventual plan is to transition to Eliquis.  Treating conservatively.  Paroxysmal A-fib. On metoprolol.  No response. Unable to tolerate p.o. and therefore patient will be on IV amiodarone.  Not okay for Cardizem due to low EF.  Rhabdomyolysis. Nontraumatic. Treated with IV fluid. Monitor.  Gross hematuria. From Foley trauma. Patient was trying to pull on his Foley catheter while receiving IV heparin. CBI recommended. Monitor.  PVD. Will resume Plavix once appropriate given his SDH.  Acute hypoxia. Etiology not clear.  Patient developed significant hypoxia requiring NRB and later on requiring BiPAP on 5/30. Chest x-ray unremarkable. ABG shows hypoxia only without any hypercarbia. Metabolic workup already has been unremarkable. No evidence of seizures or aspiration noted. Continue BiPAP as needed. Possibility of a PE cannot be ruled out although not a candidate for anticoagulation given his SDH and hematuria.  Given his mild renal dysfunction not a candidate for contrast study as that will deteriorated his condition further without change in management. Monitor for now.  Goals of care conversation. Patient's prognosis is poor. Oral intake is minimal. Discussed with son at bedside. Patient does not have any HCPOA documentation. Son [name] currently agreeable for DNR and is in the process of discussing with rest of the family members to gather their agreement as well. Will monitor. Palliative care consulted. Now DNR. Goal is to continue treatable.   Dysphagia  Speech therapy following.  MBS completed.  Recommended d1 diet nectar thick. At risk for further aspiration  Has minimal oral intake   Left ischial tuberosity pressure ulcer stage  I, unstageable medial coccyx pressure  injury, left foot stage II pressure injury. Present on admission. Continue home dressing.   Subjective: mentation better, no events overnight. No nausea no vomiting. Able to follow commands but unable to maintain longer conversation.   Physical Exam: General: in Mild distress, No Rash, oral mucosa dry Cardiovascular: S1 and S2 Present, No Murmur Respiratory: Good respiratory effort, Bilateral Air entry present. No Crackles, No wheezes Abdomen: Bowel Sound present, No tenderness Extremities: No edema Neuro: Alert and oriented to self speech incoherent Occasionally but mostly understandable , no new focal deficit. Able to follow commands.   Data Reviewed: I have Reviewed nursing notes, Vitals, and Lab results. Since last encounter, pertinent lab results CBC BMP   . I have ordered test including CBC BMP  . I have discussed pt's care plan and test results with Palliative care   .     Disposition: Status is: Inpatient Remains inpatient appropriate because: need further improvement in mentation and oral intake.  Family Communication: no one at bedside.   Level of care: Progressive   Vitals:   09/07/22 1144 09/07/22 1145 09/07/22 1504 09/07/22 1626  BP:    138/75  Pulse: (!) 112 94  93  Resp: 20 20  (!) 26  Temp:    99.1 F (37.3 C)  TempSrc:   Oral Oral  SpO2: 98% 96%  99%  Weight:      Height:         Author: Lynden Oxford, MD 09/07/2022 4:36 PM  Please look on [email or web address] to find out who is on call.

## 2022-09-07 NOTE — Progress Notes (Signed)
PT Cancellation Note  Patient Details Name: [name] MRN: [id number] DOB: 1940/10/01   Cancelled Treatment:    Reason Eval/Treat Not Completed: (P) Medical issues which prohibited therapy (defer until after MBS, also per chart review pt/family still under discussions with Palliative [id number], will defer until later in day.) Will continue efforts per PT plan of care if medically appropriate in afternoon as schedule permits.   Dorathy Kinsman [name] 09/07/2022, 12:17 PM

## 2022-09-07 NOTE — Progress Notes (Signed)
Modified Barium Swallow Study  Patient Details  Name: [name] MRN: [id number] Date of Birth: 04-11-1940  Today's Date: 09/07/2022  Modified Barium Swallow completed.  Full report located under Chart Review in the Imaging Section.  History of Present Illness The patient is an 82 year old white male with multiple medical problems who by report was found lying on the floor with his wife covered in stool and urine. He was brought to [facility] and was worked up with a head CT which was unremarkable and a cervical CT which demonstrated a type II odontoid fracture. Patient takes care of his similarly aged wife who has severe dementia and his home is in a state of disrepair and apparently squalor according to the son who lives in [place]. ST recommended D3(mechanical soft)/nectar-thickened liquids and objective assessment prior to d/c.  ST f/u for dysphagia tx/management.   Clinical Impression Pt demonstrates mild oral dysphagia marked by mild lingual residue. Pharyngeal dysphagia is moderate with decreased laryngeal elevation, hyoid excursion and tongue base retraction leading to laryngeal penetration with thin and nectar that he inconsistently sensed and throat cleared which intermittently cleared. Residue in valleculae was max with puree and honey and not decreased with cues for subsequent swallows. Esophageal scan revealed mild stasis in distal esophagus. Pt is currently weak and deconditioned and solid trial not administered. He is at an increased risk for aspiration with recommendations for continued puree, nectar thick, small sips via cup or straw, multiple swallow and volitional coughs during meal, crush pills. Full supervision and eat when alert and respirations stable. Palliative care is following pt and ST wll continue to follow. Factors that may increase risk of adverse event in presence of aspiration [facility] 2021):    Swallow Evaluation Recommendations         [name] 09/07/2022,2:45 PM

## 2022-09-07 NOTE — Progress Notes (Signed)
Daily Progress Note   Patient Name: [name]       Date: 09/07/2022 DOB: 1941-03-06  Age: 82 y.o. MRN#: [id number] Attending Physician: Rolly Salter, MD Primary Care Physician: Tresa Garter, MD Admit Date: 09/05/2022  Reason for Consultation/Follow-up: Establishing goals of care and Pain control  Patient Profile/HPI:  82 y.o. male  with past medical history of A-Fib on Eliquis, COPD, CAD s/p CABG x 4, PVD with bilateral stents, TIA, macular degeneration, stage II decubitus ulcer admitted on 08/29/2022 after found down at home after fall with altered mental status.    Patient is admitted with acute metabolic encephalopathy, A-fib with RVR, AKI, nontraumatic rhabdomyolysis, worsening odontoid fracture, and sepsis likely secondary to UTI. PMT has been consulted to assist with acute on chronic pain management of fracture as well as goals of care conversation.  Subjective: Chart reviewed including labs, progress notes, imaging from this and previous encounters. Patient assessed at the bedside. He is confused, though seems to be denying pain or distress. No family present during my visit.  Called patient's son [name] for ongoing goals of care discussions but was unable to reach. Voicemail with PMT contact information was provided.  I then received a return call from [name] and reviewed his prior conversations with my colleague as well as today's care plan with impending MBS. He shares that he is agreeable to proceeding with further testing and "doing what we need to do" for his father. He has no further questions regarding the option to transition to comfort care if patient has further decline or fails to improve. I shared that PMT support will be very limited due to staffing this  weekend and he is agreeable to continue GOC discussions on Monday. Encouraged him to reach out if there are significant changes over the weekend and patient or his family need additional support and discussions.   Review of Systems  Unable to perform ROS: Mental status change    Physical Exam Vitals and nursing note reviewed.  Constitutional:      General: He is not in acute distress.    Appearance: He is ill-appearing.  Cardiovascular:     Rate and Rhythm: Normal rate.  Pulmonary:     Effort: Pulmonary effort is normal.  Neurological:     Mental Status: He is lethargic  and disoriented.     Comments: sleepy  Psychiatric:        Cognition and Memory: Cognition is impaired.             Vital Signs: BP (!) 145/88 (BP Location: Left Arm)   Pulse (!) 106   Temp 97.7 F (36.5 C) (Oral)   Resp 20   Ht 6' (1.829 m)   Wt 78.9 kg   SpO2 97%   BMI 23.59 kg/m  SpO2: SpO2: 97 % O2 Device: O2 Device: Nasal Cannula O2 Flow Rate: O2 Flow Rate (L/min): 3 L/min  Intake/output summary:  Intake/Output Summary (Last 24 hours) at 09/07/2022 1139 Last data filed at 09/07/2022 0500 Gross per 24 hour  Intake 2289.76 ml  Output 900 ml  Net 1389.76 ml    LBM: Last BM Date : 09/05/22 Baseline Weight: Weight: 77.1 kg Most recent weight: Weight: 78.9 kg       Palliative Assessment/Data: PPS: 10%       Patient Active Problem List   Diagnosis Date Noted   Pressure injury of skin 09/06/2022   Elevated troponin 09/05/2022   Malnutrition of moderate degree 09/04/2022   PAF (paroxysmal atrial fibrillation) (HCC) 09/02/2022   Chronic atrial fibrillation with RVR (HCC) 09/01/2022   Rhabdomyolysis 09/01/2022   AKI (acute kidney injury) (HCC) 09/01/2022   AMS (altered mental status) 09/01/2022   Abnormal LFTs 09/01/2022   Sepsis secondary to UTI (HCC) 09/01/2022   Sensorineural hearing loss 04/10/2022   Tinnitus 04/10/2022   Edema 12/20/2021   Cellulitis 12/20/2021   Odontoid fracture  (HCC) 12/07/2021   Fall 12/07/2021   Atherosclerotic heart disease of native coronary artery with unspecified angina pectoris (HCC) 10/23/2021   Nicotine dependence 10/23/2021   Head trauma 10/23/2021   On anticoagulant therapy 10/23/2021   Injury of neck 10/23/2021   Vision abnormalities 10/23/2021   Fall from slipping on ice 05/03/2020   Acute hand eczema 11/16/2019   Impetigo 11/16/2019   Bursitis of left elbow 01/07/2019   Trigger finger 08/22/2017   Insomnia 12/19/2015   Prostate cancer (HCC) 12/19/2015   Other bursitis of elbow, right elbow 06/25/2015   Epiretinal membrane (ERM) of right eye 01/18/2015   Exudative macular degeneration (HCC) 01/18/2015   Pseudophakia of both eyes 01/18/2015   Hypokalemia 06/29/2014   PVD (peripheral vascular disease) with claudication (HCC)    COPD (chronic obstructive pulmonary disease) (HCC)    Hyponatremia    Nausea with vomiting    Orthostatic hypotension 06/22/2014   Dehydration 06/22/2014   Atrial flutter (HCC) 06/22/2014   Pain in joint, shoulder region 06/14/2014   Acute blood loss anemia 06/09/2014   Hypertensive heart disease 06/09/2014   Hematuria 05/31/2014   S/P CABG x 4 05/28/2014   Chronic anticoagulation-on Eliquis at discharge 05/11/14 05/25/2014   Ischemic cardiomyopathy 05/25/2014   Protein-calorie malnutrition, severe (HCC) 05/25/2014   Encephalopathy 05/14/2014   Acute respiratory acidosis (HCC)    S/P CABG x 2 05/04/2014   Acute respiratory failure (HCC)    STEMI 05/04/14 with shock, CPR, CHB, VT 04/30/2014   Cardiogenic shock (HCC) 04/30/2014   Cardiac arrest (HCC) 04/30/2014   VT (ventricular tachycardia) (HCC) 04/30/2014   CHB (complete heart block) (HCC) 04/30/2014   CAD S/P LM and CFX PCI 05/04/14 04/30/2014   Wart 04/18/2014   Laceration of index finger 12/04/2012   Hand eczema 05/26/2012   Bruising 02/20/2012   Nuclear cataract 12/26/2011   Pseudophakia 12/26/2011   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   Actinic  keratoses 11/14/2011   Constipation 07/18/2011   Sinusitis, acute 04/18/2011   Ventral hernia 01/03/2011   SWEATING 06/26/2010   History of transient ischemic attack (TIA)    CERUMEN IMPACTION 02/20/2010   DIVERTICULOSIS-COLON 11/01/2009   RECTAL BLEEDING 11/01/2009   ABDOMINAL BLOATING 11/01/2009   ABNORMAL BOWEL SOUNDS 11/01/2009   ABDOMINAL PAIN-RLQ 11/01/2009   ABDOMINAL PAIN, GENERALIZED 11/01/2009   Rash and nonspecific skin eruption 07/11/2009   TRANSIENT ISCHEMIC ATTACK, HX OF 03/22/2009   BRONCHITIS, ACUTE 11/26/2008   Personal history of malignant neoplasm of prostate 08/02/2008   ALLERGIC RHINITIS 08/06/2007   ELEVATED PROSTATE SPECIFIC ANTIGEN 08/06/2007   Hyperlipidemia 04/22/2007   Neuropathic pain of both legs 04/22/2007   UPPER RESPIRATORY INFECTION (URI) 04/22/2007   COPD exacerbation (HCC) 04/22/2007   Osteoarthritis 04/22/2007   LOW BACK PAIN 04/22/2007    Palliative Care Assessment & Plan    Assessment/Recommendations/Plan  Continue current care; family not ready for transition to comfort care given that some mentation improvement has been noted today DNR- continue medical interventions Reassess patient's status on Monday and consider comfort in not improving   Code Status: DNR   Prognosis:  Unable to determine / Guarded   MDM: High   Naziah Weckerly Loleta Rose Palliative Medicine Team Team phone # [phone]  Thank you for allowing the Palliative Medicine Team to assist in the care of this patient. Please utilize secure chat with additional questions, if there is no response within 30 minutes please call the above phone number.  Palliative Medicine Team providers are available by phone from 7am to 7pm daily and can be reached through the team cell phone.  Should this patient require assistance outside of these hours, please call the patient's attending physician.  Portions of this note are a verbal dictation therefore any spelling and/or  grammatical errors are due to the "[employer]agon [employer] One" system interpretation.

## 2022-09-07 NOTE — Progress Notes (Signed)
   Providing Compassionate, Quality Care - Together   Subjective: Patient asleep. Awakens to voice.  Objective: Vital signs in last 24 hours: Temp:  [97.6 F (36.4 C)-99.3 F (37.4 C)] 97.6 F (36.4 C) (05/31 0755) Pulse Rate:  [89-129] 109 (05/31 0755) Resp:  [19-31] 20 (05/31 0755) BP: (127-152)/(72-93) 134/86 (05/31 0755) SpO2:  [99 %-100 %] 100 % (05/31 0755) FiO2 (%):  [40 %] 40 % (05/30 1600) Weight:  [78.9 kg] 78.9 kg (05/31 0424)  Intake/Output from previous day: 05/30 0701 - 05/31 0700 In: 2289.8 [I.V.:1786.2; IV Piggyback:503.6] Out: 900 [Urine:900] Intake/Output this shift: No intake/output data recorded.  Responds to voice PERRLA A-fib Oriented to self, dysarthric MAE, Follows commands Generalized weakness BUE, BLE  Lab Results: Recent Labs    09/06/22 0033 09/07/22 0011  WBC 13.8* 13.0*  HGB 12.7* 10.8*  HCT 38.6* 33.0*  PLT 266 213   BMET Recent Labs    09/06/22 0033 09/07/22 0011  NA 148* 146*  K 4.7 4.1  CL 117* 116*  CO2 19* 18*  GLUCOSE 127* 101*  BUN 38* 35*  CREATININE 1.01 0.87  CALCIUM 9.0 8.2*    Studies/Results: EEG adult  Result Date: 09/06/2022 Charlsie Quest, MD     09/06/2022  2:40 PM Patient Name: [name] MRN: [id number] Epilepsy Attending: Charlsie Quest Referring Physician/Provider: Rolly Salter, MD Date: 09/06/2022 Duration: 23.07 mins Patient history: 82 year old male with altered mental status getting EEG to evaluate for seizure Level of alertness: Awake AEDs during EEG study: None Technical aspects: This EEG study was done with scalp electrodes positioned according to the 10-20 International system of electrode placement. Electrical activity was reviewed with band pass filter of 1-70Hz , sensitivity of 7 uV/mm, display speed of 15mm/sec with a 60Hz  notched filter applied as appropriate. EEG data were recorded continuously and digitally stored.  Video monitoring was available and reviewed as appropriate.  Description: No posterior dominant rhythm was seen. EEG showed continuous generalized 3 to 6 Hz theta-delta slowing. Hyperventilation and photic stimulation were not performed.   ABNORMALITY - Continuous slow, generalized IMPRESSION: This study is suggestive of moderate diffuse encephalopathy, nonspecific etiology. No seizures or epileptiform discharges were seen throughout the recording. Priyanka [name]   DG CHEST PORT 1 VIEW  Result Date: 09/06/2022 CLINICAL DATA:  Hypoxia EXAM: PORTABLE CHEST 1 VIEW COMPARISON:  Chest radiograph 1 day prior FINDINGS: Median sternotomy wires and mediastinal surgical clips are stable. The heart size is stable. The upper mediastinal contours are normal There is persistent opacity in the right base which may reflect atelectasis or pneumonia, stable to slightly improved. Linear opacity in the lingula likely reflects atelectasis. There is no other new or worsening focal airspace disease. There is no pulmonary edema. There is no pleural effusion or pneumothorax. There is no acute osseous abnormality. IMPRESSION: Stable to slightly improved opacities in the right base which may reflect atelectasis or pneumonia and probable atelectasis in the lingula. Electronically Signed   By: Lesia Hausen M.D.   On: 09/06/2022 08:48    Assessment/Plan: Patient with small bilateral superior convexity SDH and type II odontoid fracture. Recommend continuing to hold Eliquis. The patient is not a surgical candidate.    LOS: 6 days     Val Eagle, DNP, AGNP-C Nurse Practitioner  [facility] [address], [address], [place], [place] [address] P: [phone]    F: [phone]  09/07/2022, 10:38 AM

## 2022-09-07 NOTE — Progress Notes (Signed)
Speech Language Pathology Treatment: Dysphagia  Patient Details Name: [name] MRN: [id number] DOB: 1940/09/19 Today's Date: 09/07/2022 Time: 4098-1191 SLP Time Calculation (min) (ACUTE ONLY): 14 min  Assessment / Plan / Recommendation Clinical Impression  Pt seen by ST on Sunday and placed on Dys 2/nectar thick liquids with plan of performing MBS when appropriate and also seen Wed with recommendations to continue current diet. On 5/30 pt with low sats, required Bipap and made NPO. Today he is off Bipap and MD started pt on puree, nectar liquids. On arrival pt need oral care to remove oral secretions which was moderately effective. After oral care his speech was somewhat clearer. He exhibited delayed bolus transit with indications of possible delayed pharyngeal swallow. Swallow to palpation appeared weak and decreased. No s/s aspiration however increased risk given clinical findings, decreased respiratory status yesterday and deconditioning. Recommend MBS which is scheduled for today at 1330.   HPI HPI: The patient is an 82 year old white male with multiple medical problems who by report was found lying on the floor with his wife covered in stool and urine. He was brought to [facility] and was worked up with a head CT which was unremarkable and a cervical CT which demonstrated a type II odontoid fracture. Patient takes care of his similarly aged wife who has severe dementia and his home is in a state of disrepair and apparently squalor according to the son who lives in [place]. ST recommended D3(mechanical soft)/nectar-thickened liquids and objective assessment prior to d/c.  ST f/u for dysphagia tx/management.      SLP Plan  MBS      Recommendations for follow up therapy are one component of a multi-disciplinary discharge planning process, led by the attending physician.  Recommendations may be updated based on patient status, additional functional criteria and insurance  authorization.    Recommendations  Diet recommendations:  (defter until MBS)                  Oral care QID   Frequent or constant Supervision/Assistance Dysphagia, oropharyngeal phase (R13.12)     MBS     [name]  09/07/2022, 11:17 AM

## 2022-09-07 NOTE — Plan of Care (Signed)
  Kyphoplasty still on hold due to WBC 13.   Will tentatively plan for Monday.   Gwynneth Macleod PA-C 09/07/2022 12:57 PM

## 2022-09-08 DIAGNOSIS — R400 Somnolence: Secondary | ICD-10-CM | POA: Diagnosis not present

## 2022-09-08 LAB — CBC
HCT: 36.1 % — ABNORMAL LOW (ref 39.0–52.0)
Hemoglobin: 11.8 g/dL — ABNORMAL LOW (ref 13.0–17.0)
MCH: 33.1 pg (ref 26.0–34.0)
MCHC: 32.7 g/dL (ref 30.0–36.0)
MCV: 101.1 fL — ABNORMAL HIGH (ref 80.0–100.0)
Platelets: 230 10*3/uL (ref 150–400)
RBC: 3.57 MIL/uL — ABNORMAL LOW (ref 4.22–5.81)
RDW: 13.2 % (ref 11.5–15.5)
WBC: 11.1 10*3/uL — ABNORMAL HIGH (ref 4.0–10.5)
nRBC: 0 % (ref 0.0–0.2)

## 2022-09-08 LAB — BASIC METABOLIC PANEL
Anion gap: 12 (ref 5–15)
Anion gap: 15 (ref 5–15)
Anion gap: 8 (ref 5–15)
Anion gap: 8 (ref 5–15)
Anion gap: 9 (ref 5–15)
BUN: 32 mg/dL — ABNORMAL HIGH (ref 8–23)
BUN: 32 mg/dL — ABNORMAL HIGH (ref 8–23)
BUN: 34 mg/dL — ABNORMAL HIGH (ref 8–23)
BUN: 35 mg/dL — ABNORMAL HIGH (ref 8–23)
BUN: 35 mg/dL — ABNORMAL HIGH (ref 8–23)
CO2: 20 mmol/L — ABNORMAL LOW (ref 22–32)
CO2: 22 mmol/L (ref 22–32)
CO2: 22 mmol/L (ref 22–32)
CO2: 23 mmol/L (ref 22–32)
CO2: 23 mmol/L (ref 22–32)
Calcium: 8.5 mg/dL — ABNORMAL LOW (ref 8.9–10.3)
Calcium: 8.5 mg/dL — ABNORMAL LOW (ref 8.9–10.3)
Calcium: 8.7 mg/dL — ABNORMAL LOW (ref 8.9–10.3)
Calcium: 8.7 mg/dL — ABNORMAL LOW (ref 8.9–10.3)
Calcium: 9.1 mg/dL (ref 8.9–10.3)
Chloride: 117 mmol/L — ABNORMAL HIGH (ref 98–111)
Chloride: 120 mmol/L — ABNORMAL HIGH (ref 98–111)
Chloride: 120 mmol/L — ABNORMAL HIGH (ref 98–111)
Chloride: 121 mmol/L — ABNORMAL HIGH (ref 98–111)
Chloride: 122 mmol/L — ABNORMAL HIGH (ref 98–111)
Creatinine, Ser: 0.96 mg/dL (ref 0.61–1.24)
Creatinine, Ser: 0.97 mg/dL (ref 0.61–1.24)
Creatinine, Ser: 1.02 mg/dL (ref 0.61–1.24)
Creatinine, Ser: 1.02 mg/dL (ref 0.61–1.24)
Creatinine, Ser: 1.03 mg/dL (ref 0.61–1.24)
GFR, Estimated: >60 mL/min (ref >60)
GFR, Estimated: >60 mL/min (ref >60)
GFR, Estimated: >60 mL/min (ref >60)
GFR, Estimated: >60 mL/min (ref >60)
GFR, Estimated: >60 mL/min (ref >60)
Glucose, Bld: 124 mg/dL — ABNORMAL HIGH (ref 70–99)
Glucose, Bld: 133 mg/dL — ABNORMAL HIGH (ref 70–99)
Glucose, Bld: 148 mg/dL — ABNORMAL HIGH (ref 70–99)
Glucose, Bld: 148 mg/dL — ABNORMAL HIGH (ref 70–99)
Glucose, Bld: 152 mg/dL — ABNORMAL HIGH (ref 70–99)
Potassium: 3.4 mmol/L — ABNORMAL LOW (ref 3.5–5.1)
Potassium: 3.6 mmol/L (ref 3.5–5.1)
Potassium: 3.7 mmol/L (ref 3.5–5.1)
Potassium: 3.8 mmol/L (ref 3.5–5.1)
Potassium: 4.1 mmol/L (ref 3.5–5.1)
Sodium: 150 mmol/L — ABNORMAL HIGH (ref 135–145)
Sodium: 151 mmol/L — ABNORMAL HIGH (ref 135–145)
Sodium: 153 mmol/L — ABNORMAL HIGH (ref 135–145)
Sodium: 154 mmol/L — ABNORMAL HIGH (ref 135–145)
Sodium: 154 mmol/L — ABNORMAL HIGH (ref 135–145)

## 2022-09-08 LAB — GLUCOSE, CAPILLARY
Glucose-Capillary: 120 mg/dL — ABNORMAL HIGH (ref 70–99)
Glucose-Capillary: 131 mg/dL — ABNORMAL HIGH (ref 70–99)
Glucose-Capillary: 132 mg/dL — ABNORMAL HIGH (ref 70–99)
Glucose-Capillary: 119 mg/dL — ABNORMAL HIGH (ref 70–99)
Glucose-Capillary: 133 mg/dL — ABNORMAL HIGH (ref 70–99)
Glucose-Capillary: 134 mg/dL — ABNORMAL HIGH (ref 70–99)

## 2022-09-08 MED ORDER — SODIUM CHLORIDE 0.9 % IV SOLN
150.0000 mg | Freq: Once | INTRAVENOUS | Status: AC
  Administered 2022-09-08: 150 mg via INTRAVENOUS
  Filled 2022-09-08: qty 7.5

## 2022-09-08 MED ORDER — FLUCONAZOLE 100MG IVPB
100.0000 mg | INTRAVENOUS | Status: DC
  Filled 2022-09-08: qty 50

## 2022-09-08 MED ORDER — LIDOCAINE 5 % EX PTCH
3.0000 | MEDICATED_PATCH | CUTANEOUS | Status: DC
  Administered 2022-09-08 – 2022-09-10 (×3): 3 via TRANSDERMAL
  Filled 2022-09-08 (×3): qty 3

## 2022-09-08 MED ORDER — DEXTROSE-SODIUM CHLORIDE 5-0.45 % IV SOLN: INTRAVENOUS | Status: DC

## 2022-09-08 MED ORDER — FLUCONAZOLE IN SODIUM CHLORIDE 200-0.9 MG/100ML-% IV SOLN
200.0000 mg | Freq: Once | INTRAVENOUS | Status: DC
  Filled 2022-09-08: qty 100

## 2022-09-08 NOTE — Progress Notes (Signed)
  NEUROSURGERY PROGRESS NOTE   No issues overnight.   EXAM:  BP (!) 152/121 (BP Location: Left Arm)   Pulse 100   Temp 97.8 F (36.6 C) (Oral)   Resp 19   Ht 6' (1.829 m)   Wt 80.9 kg   SpO2 99%   BMI 24.19 kg/m   Awake, alert, severely dysarthric  Tracking follows commands briskly all extremities [place]-J collar in place  IMPRESSION:  82 y.o. male with type II odontoid fracture and questionable minuscule bilateral convexity SDH. Appears somewhat more confused today, more difficulty with speech.  PLAN: - MRI brain already ordered by primary service - Cont [place]-J collar   Lisbeth Renshaw, MD [facility]

## 2022-09-08 NOTE — Progress Notes (Signed)
[facility] Progress Note Patient: [name]:[id number] DOB: 1941/03/18 DOA: 08/30/2022  DOS: the patient was seen and examined on 09/08/2022  Brief hospital course: past medical history of A-Fib on Eliquis, COPD, CAD s/p CABG x 4, PVD with bilateral stents, TIA, macular degeneration, stage II decubitus ulcer admitted on 08/13/2022 after found down at home after fall with altered mental status.  Found to have C2 fracture, T9 fracture.  Also had elevated troponin for which patient was started on IV heparin but then developed hematuria secondary to Foley trauma now requiring CBI.  Also found to have SDH. Cardiology, palliative care, IR, neurosurgery, urology consulted. Assessment and Plan: Unwitnessed fall. Acute metabolic encephalopathy. Ongoing confusion.  Progressive lethargy. Metabolic workup so far shows mild AKI. Possible concussion cannot be ruled out. Repeat CT scan on 5/29 shows SDH although would not explain patient's progressive lethargy ongoing for last few days. Possible cefepime induced encephalopathy cannot be ruled out as well. Mentation improving further. Initially MRI was ordered but as the mentation is improving we will discontinue the MRI.  Suspect this is mostly cefepime induced encephalopathy.  Concern for sepsis.  Ruled out. Patient had lactic acidosis at the time of admission and there was some concern that he could have an aspiration event with sepsis. Speech therapy following the patient. Chest x-ray does not show any significant pneumonia. Was on IV cefepime.  Switched to IV Unasyn negative procalcitonin but will complete a 7-day treatment course.  C2 fracture. SDH. Discussed with neurosurgery. Recommend conservative measures. No intervention recommended.  T9 fracture. IR consulted.  Kyphoplasty recommended.  Will monitor.  Elevated troponin. Acute systolic CHF. Echocardiogram shows EF of 45 to 50%. Troponins were minimally elevated  without any ACS pattern. EKG unremarkable. Patient unable to tell me whether he has any angina symptoms or not. Less likely this is ACS.  Most likely this is related to his rhabdomyolysis. Patient was treated with IV heparin which is now discontinued due to ongoing hematuria. For now treating conservatively.  Paroxysmal A-fib. On metoprolol.  No response. Unable to tolerate p.o. and therefore patient will be on IV amiodarone.  Not okay for Cardizem due to low EF. Eventual plan is to transition to Eliquis.  Rhabdomyolysis. Nontraumatic. Treated with IV fluid. Monitor.  Gross hematuria. From Foley trauma. Patient was trying to pull on his Foley catheter while receiving IV heparin. CBI recommended. Monitor.  PVD. Will resume Plavix once appropriate given his SDH.  Acute hypoxia. Etiology not clear.  Patient developed significant hypoxia requiring NRB and later on requiring BiPAP on 5/30. Chest x-ray unremarkable. ABG shows hypoxia only without any hypercarbia. Metabolic workup already has been unremarkable. No evidence of seizures or aspiration noted. Continue BiPAP as needed. Possibility of a PE cannot be ruled out although not a candidate for anticoagulation given his SDH and hematuria.  Given his mild renal dysfunction not a candidate for contrast study as that will deteriorated his condition further without change in management. Monitor for now.  Goals of care conversation. Patient's prognosis is poor. Oral intake is minimal. Discussed with son at bedside. Patient does not have any HCPOA documentation. Palliative care consulted. Now DNR. Goal is to continue treatable.   Dysphagia  Speech therapy following.  MBS completed.  Recommended d1 diet nectar thick. At risk for further aspiration  Has minimal oral intake   Left ischial tuberosity pressure ulcer stage I, unstageable medial coccyx pressure injury, left foot stage II pressure injury. Present on  admission. Continue home  dressing.  Thrush. Will initiate therapy.  Currently IV.  Likely transition to p.o.   Subjective: Mentation continues to improve.  Speech is not clear secondary dry mouth.  Possible thrush as well.  No nausea vomiting.  Physical Exam: In moderate distress.  No rash. Oral mucosa-shows possible thrush. Clear to auscultation. S1-S2 present. Bowel sound present. No edema. No asterixis.  Able to follow commands.  Alert awake and oriented to x 3.  Data Reviewed: I have Reviewed nursing notes, Vitals, and Lab results. Since last encounter, pertinent lab results CBC and BMP   . I have ordered test including CBC and BMP  .      Disposition: Status is: Inpatient Remains inpatient appropriate because: need further improvement in mentation and oral intake.  Family Communication: no one at bedside.   Level of care: Progressive   Vitals:   09/07/22 1900 09/08/22 0500 09/08/22 1132 09/08/22 1626  BP: (!) 152/121  (!) 132/95 (!) 151/90  Pulse: 100  (!) 116 92  Resp: 19  19 20   Temp: 97.8 F (36.6 C)  98.4 F (36.9 C) 98.4 F (36.9 C)  TempSrc: Oral  Axillary Axillary  SpO2: 99%  96% 95%  Weight:  80.9 kg    Height:         Author: Lynden Oxford, MD 09/08/2022 6:00 PM  Please look on [email or web address] to find out who is on call.

## 2022-09-08 DEATH — deceased

## 2022-09-09 ENCOUNTER — Inpatient Hospital Stay (HOSPITAL_COMMUNITY): Payer: Medicare Other

## 2022-09-09 ENCOUNTER — Other Ambulatory Visit: Payer: Self-pay

## 2022-09-09 DIAGNOSIS — R400 Somnolence: Secondary | ICD-10-CM | POA: Diagnosis not present

## 2022-09-09 LAB — BASIC METABOLIC PANEL
Anion gap: 8 (ref 5–15)
Anion gap: 13 (ref 5–15)
Anion gap: 10 (ref 5–15)
Anion gap: 11 (ref 5–15)
Anion gap: 14 (ref 5–15)
BUN: 31 mg/dL — ABNORMAL HIGH (ref 8–23)
BUN: 30 mg/dL — ABNORMAL HIGH (ref 8–23)
BUN: 29 mg/dL — ABNORMAL HIGH (ref 8–23)
BUN: 31 mg/dL — ABNORMAL HIGH (ref 8–23)
BUN: 34 mg/dL — ABNORMAL HIGH (ref 8–23)
CO2: 23 mmol/L (ref 22–32)
CO2: 19 mmol/L — ABNORMAL LOW (ref 22–32)
CO2: 22 mmol/L (ref 22–32)
CO2: 22 mmol/L (ref 22–32)
CO2: 18 mmol/L — ABNORMAL LOW (ref 22–32)
Calcium: 8.5 mg/dL — ABNORMAL LOW (ref 8.9–10.3)
Calcium: 8.5 mg/dL — ABNORMAL LOW (ref 8.9–10.3)
Calcium: 8.3 mg/dL — ABNORMAL LOW (ref 8.9–10.3)
Calcium: 8 mg/dL — ABNORMAL LOW (ref 8.9–10.3)
Calcium: 8.1 mg/dL — ABNORMAL LOW (ref 8.9–10.3)
Chloride: 121 mmol/L — ABNORMAL HIGH (ref 98–111)
Chloride: 118 mmol/L — ABNORMAL HIGH (ref 98–111)
Chloride: 120 mmol/L — ABNORMAL HIGH (ref 98–111)
Chloride: 116 mmol/L — ABNORMAL HIGH (ref 98–111)
Chloride: 118 mmol/L — ABNORMAL HIGH (ref 98–111)
Creatinine, Ser: 1.05 mg/dL (ref 0.61–1.24)
Creatinine, Ser: 0.98 mg/dL (ref 0.61–1.24)
Creatinine, Ser: 1.08 mg/dL (ref 0.61–1.24)
Creatinine, Ser: 1.23 mg/dL (ref 0.61–1.24)
Creatinine, Ser: 1.32 mg/dL — ABNORMAL HIGH (ref 0.61–1.24)
GFR, Estimated: >60 mL/min (ref >60)
GFR, Estimated: >60 mL/min (ref >60)
GFR, Estimated: >60 mL/min (ref >60)
GFR, Estimated: 58 mL/min — ABNORMAL LOW (ref >60)
GFR, Estimated: 54 mL/min — ABNORMAL LOW (ref >60)
Glucose, Bld: 160 mg/dL — ABNORMAL HIGH (ref 70–99)
Glucose, Bld: 167 mg/dL — ABNORMAL HIGH (ref 70–99)
Glucose, Bld: 174 mg/dL — ABNORMAL HIGH (ref 70–99)
Glucose, Bld: 288 mg/dL — ABNORMAL HIGH (ref 70–99)
Glucose, Bld: 189 mg/dL — ABNORMAL HIGH (ref 70–99)
Potassium: 3.4 mmol/L — ABNORMAL LOW (ref 3.5–5.1)
Potassium: 4.3 mmol/L (ref 3.5–5.1)
Potassium: 3.9 mmol/L (ref 3.5–5.1)
Potassium: 3.3 mmol/L — ABNORMAL LOW (ref 3.5–5.1)
Potassium: 4 mmol/L (ref 3.5–5.1)
Sodium: 152 mmol/L — ABNORMAL HIGH (ref 135–145)
Sodium: 150 mmol/L — ABNORMAL HIGH (ref 135–145)
Sodium: 152 mmol/L — ABNORMAL HIGH (ref 135–145)
Sodium: 149 mmol/L — ABNORMAL HIGH (ref 135–145)
Sodium: 150 mmol/L — ABNORMAL HIGH (ref 135–145)

## 2022-09-09 LAB — CBC
HCT: 36.6 % — ABNORMAL LOW (ref 39.0–52.0)
Hemoglobin: 11.9 g/dL — ABNORMAL LOW (ref 13.0–17.0)
MCH: 33 pg (ref 26.0–34.0)
MCHC: 32.5 g/dL (ref 30.0–36.0)
MCV: 101.4 fL — ABNORMAL HIGH (ref 80.0–100.0)
Platelets: 242 10*3/uL (ref 150–400)
RBC: 3.61 MIL/uL — ABNORMAL LOW (ref 4.22–5.81)
RDW: 13.6 % (ref 11.5–15.5)
WBC: 10.5 10*3/uL (ref 4.0–10.5)
nRBC: 0.3 % — ABNORMAL HIGH (ref 0.0–0.2)

## 2022-09-09 LAB — PROTIME-INR
INR: 1.6 — ABNORMAL HIGH (ref 0.8–1.2)
Prothrombin Time: 19.5 seconds — ABNORMAL HIGH (ref 11.4–15.2)

## 2022-09-09 LAB — GLUCOSE, CAPILLARY
Glucose-Capillary: 145 mg/dL — ABNORMAL HIGH (ref 70–99)
Glucose-Capillary: 140 mg/dL — ABNORMAL HIGH (ref 70–99)
Glucose-Capillary: 165 mg/dL — ABNORMAL HIGH (ref 70–99)

## 2022-09-09 LAB — LACTIC ACID, PLASMA
Lactic Acid, Venous: 2.5 mmol/L (ref 0.5–1.9)
Lactic Acid, Venous: 3.2 mmol/L (ref 0.5–1.9)

## 2022-09-09 LAB — PROCALCITONIN: Procalcitonin: 2.85 ng/mL

## 2022-09-09 MED ORDER — ALBUTEROL SULFATE (2.5 MG/3ML) 0.083% IN NEBU
2.5000 mg | INHALATION_SOLUTION | RESPIRATORY_TRACT | Status: DC | PRN
  Administered 2022-09-09: 2.5 mg via RESPIRATORY_TRACT
  Filled 2022-09-09: qty 3

## 2022-09-09 MED ORDER — DIGOXIN 0.25 MG/ML IJ SOLN
0.2500 mg | Freq: Four times a day (QID) | INTRAMUSCULAR | Status: DC
  Administered 2022-09-10: 0.25 mg via INTRAVENOUS
  Filled 2022-09-09 (×2): qty 1

## 2022-09-09 MED ORDER — SODIUM CHLORIDE 0.9% FLUSH
10.0000 mL | Freq: Two times a day (BID) | INTRAVENOUS | Status: DC
  Administered 2022-09-09 – 2022-09-10 (×2): 10 mL

## 2022-09-09 MED ORDER — SODIUM CHLORIDE 0.9 % IV SOLN
100.0000 mg | INTRAVENOUS | Status: DC
  Administered 2022-09-09 – 2022-09-10 (×2): 100 mg via INTRAVENOUS
  Filled 2022-09-09 (×2): qty 5

## 2022-09-09 MED ORDER — METOPROLOL TARTRATE 5 MG/5ML IV SOLN
5.0000 mg | Freq: Four times a day (QID) | INTRAVENOUS | Status: DC
  Administered 2022-09-09 – 2022-09-10 (×5): 5 mg via INTRAVENOUS
  Filled 2022-09-09 (×5): qty 5

## 2022-09-09 MED ORDER — DEXTROSE 5 % IV SOLN: INTRAVENOUS | Status: DC

## 2022-09-09 MED ORDER — POTASSIUM CHLORIDE 10 MEQ/100ML IV SOLN
10.0000 meq | INTRAVENOUS | Status: AC
  Administered 2022-09-09 (×3): 10 meq via INTRAVENOUS
  Filled 2022-09-09 (×3): qty 100

## 2022-09-09 MED ORDER — ACETYLCYSTEINE 20 % IN SOLN
4.0000 mL | Freq: Once | RESPIRATORY_TRACT | Status: AC
  Administered 2022-09-09: 4 mL via RESPIRATORY_TRACT
  Filled 2022-09-09: qty 4

## 2022-09-09 MED ORDER — SODIUM CHLORIDE 0.9% FLUSH: 10.0000 mL | INTRAVENOUS | Status: DC | PRN

## 2022-09-09 MED ORDER — SODIUM CHLORIDE 0.9 % IN NEBU: 3.0000 mL | INHALATION_SOLUTION | Freq: Three times a day (TID) | RESPIRATORY_TRACT | Status: DC | PRN

## 2022-09-09 MED ORDER — POTASSIUM CHLORIDE 10 MEQ/50ML IV SOLN
10.0000 meq | INTRAVENOUS | Status: AC
  Administered 2022-09-09 (×4): 10 meq via INTRAVENOUS
  Filled 2022-09-09 (×4): qty 50

## 2022-09-09 MED ORDER — LACTATED RINGERS IV BOLUS
500.0000 mL | Freq: Once | INTRAVENOUS | Status: AC
  Administered 2022-09-09: 500 mL via INTRAVENOUS

## 2022-09-09 MED ORDER — SODIUM CHLORIDE 0.9 % IV SOLN
3.0000 g | Freq: Four times a day (QID) | INTRAVENOUS | Status: DC
  Administered 2022-09-09 – 2022-09-10 (×4): 3 g via INTRAVENOUS
  Filled 2022-09-09 (×5): qty 8

## 2022-09-09 MED ORDER — DIGOXIN 0.25 MG/ML IJ SOLN
0.5000 mg | Freq: Once | INTRAMUSCULAR | Status: AC
  Administered 2022-09-09: 0.5 mg via INTRAVENOUS
  Filled 2022-09-09: qty 2

## 2022-09-09 MED ORDER — FUROSEMIDE 10 MG/ML IJ SOLN
20.0000 mg | Freq: Once | INTRAMUSCULAR | Status: AC
  Administered 2022-09-09: 20 mg via INTRAVENOUS
  Filled 2022-09-09: qty 2

## 2022-09-09 NOTE — Progress Notes (Signed)
Called by nursing pt HR increasing to the 150's and 170.patient on IV amiodarone. Will initiate IV digoxin. Digoxin level in AM ABG also ordered. Patient very lethargic on BiPaP

## 2022-09-09 NOTE — Progress Notes (Signed)
[facility] Progress Note Patient: [name] WUJ:[id number] DOB: Aug 02, 1940 DOA: 09/01/2022  DOS: the patient was seen and examined on 09/09/2022  Brief hospital course: past medical history of A-Fib on Eliquis, COPD, CAD s/p CABG x 4, PVD with bilateral stents, TIA, macular degeneration, stage II decubitus ulcer admitted on 09/07/2022 after found down at home after fall with altered mental status.  Found to have C2 fracture, T9 fracture.  Also had elevated troponin for which patient was started on IV heparin but then developed hematuria secondary to Foley trauma now requiring CBI.  Also found to have SDH. Cardiology, palliative care, IR, neurosurgery, urology consulted. Mentation progressively worsening.  Unable to swallow safely.  Developing possible mucous plugging.  Continues to have abdominal pain and tenderness.  Also developed hypernatremia.  Discussed with family.  Prognosis very poor.  If his condition deteriorates further would recommend transition to complete comfort.  Not stable for kyphoplasty on 6/3 currently as of 6/2. Assessment and Plan: Unwitnessed fall.  Scalp hematoma as well as SDH. Acute metabolic encephalopathy. Ongoing confusion.  Progressive lethargy. Presents with an unwitnessed fall as the family was unable to get in touch with the patient or wife and required a wellness check and forced entry. Patient and wife both were found on the kitchen floor. Admission CT scan of the head did not show any acute abnormality. C-spine showed evidence of C2 and T9 fractures. Also had some scalp hematoma. With ongoing confusion. Possible concussion cannot be ruled out. Repeat CT scan on 5/29 shows SDH although would not explain patient's progressive lethargy ongoing for last few days. Possible cefepime induced encephalopathy cannot be ruled out as well. While mentation is improving after stopping cefepime, not significant enough as still unable to follow consistent  commands or unable to swallow safely.  Acute hypoxic respiratory failure. Possible aspiration pneumonia/pneumonitis Concern for mucous plugging. Concern for sepsis.  Ruled out. Patient had lactic acidosis at the time of admission and there was some concern that he could have an aspiration event with sepsis. Speech therapy following the patient. Initial chest x-ray does not show any significant pneumonia. Repeat chest x-ray on 6/2 shows evidence of bilateral basilar pneumonia. Patient has already completed 7 days of IV antibiotic.  Will add 3 more days of antibiotic to complete on 6/5. Recheck procalcitonin level. Check CT chest. RT is performing NT suctioning and reports that mucous plugs are significantly thick. Mucomyst and saline nebulization ordered as well. Currently on BiPAP as needed. Possibility of a PE cannot be ruled out although not a candidate for anticoagulation given his SDH and hematuria.  Given his mild renal dysfunction not a candidate for contrast study as that will deteriorated his condition further without change in management.   C2 fracture. Discussed with neurosurgery. Recommend conservative measures. No intervention recommended.   T9 fracture. IR consulted.  Kyphoplasty recommended.  Currently unstable to undergo intervention.   Elevated troponin. Acute systolic CHF. Echocardiogram shows EF of 45 to 50%. Troponins were minimally elevated without any ACS pattern. EKG unremarkable. Patient unable to tell me whether he has any angina symptoms or not. Less likely this is ACS.  Most likely troponin elevation is related to his rhabdomyolysis. Patient was treated with IV heparin which was discontinued due to hematuria. Currently not a candidate for antithrombotic therapy or DVT prophylaxis as has SDH. Chest x-ray shows some vascular congestion.  Received IV Lasix on 6/2.  Paroxysmal A-fib.  With RVR Initial was on metoprolol without any benefit. Treated  with IV  amiodarone and remains tachycardic as of 6/2.  Current RVR likely secondary to respiratory distress. IV metoprolol now switched to scheduled dose. Not a candidate for Cardizem therapy. May require digoxin if does not respond to avoid therapy. No recommended for anticoagulation right now but eventual plan was to transition to Eliquis.  Abdominal pain. X-ray abdomen performed earlier did not show any acute abnormality. CT abdomen ordered.  Will monitor results.   Rhabdomyolysis. Nontraumatic. Treated with IV fluid. Monitor.   Gross hematuria. From Foley trauma. Patient was trying to pull on his Foley catheter while receiving IV heparin. Had CBI therapy.  Currently urine is clear.   PVD. Will resume Plavix once appropriate given his SDH.   Goals of care conversation. Patient's prognosis is poor. Oral intake is minimal.  Presents with unwitnessed fall and has not improved in his mentation.  Has developed C2 and T9 fracture.  At risk for severe aspiration and most likely have recurrent aspiration pneumonitis causing hypoxia. Also developed acute systolic CHF. Conservatively treated for SDH. Not a candidate for anticoagulation in the setting of severe paroxysmal A-fib with RVR. Has not been able to eat adequately since admission. Feeding tube will not avoid aspiration risk. TPN will also be high risk for infection. Patient's prognosis is poor as able and currently son has agreed for recommendation for DNR. Goal remains to treat what is treatable. If his condition deteriorates further I have recommended send to consider transition to complete comfort given multitude of comorbid conditions causing acute illness.  Especially if you found to have any new acute abnormalities on the CT scan of the chest or the abdomen.   Dysphagia  Speech therapy following.  MBS completed.  Recommended d1 diet nectar thick. At risk for further aspiration  Has minimal oral intake.  Currently will be  keeping NPO.   Left ischial tuberosity pressure ulcer stage I, unstageable medial coccyx pressure injury, left foot stage II pressure injury. Present on admission. Continue home dressing.   Thrush. Will initiate therapy.  Currently receiving IV micafungin. Not a candidate for fluconazole with his prolonged QTc. Unable to tolerate p.o. for now.  Hypernatremia. Receiving IV D5.  From poor p.o. intake with free water deficit. Monitor BMP.  Limited IV access. PICC line ordered. Monitor.  Subjective: Patient increasingly short of breath.  Continues to have back pain.  No nausea no vomiting.  Physical Exam: general: in severe distress, No Rash Cardiovascular: S1 and S2 Present, No Murmur Respiratory: Increased respiratory effort, Bilateral Air entry present.  Bilateral crackles, No wheezes Abdomen: Bowel Sound present, bilateral diffuse tenderness with voluntary guarding without rigidity Extremities: No edema Neuro: Alert and oriented to self, no new focal deficit, moving all extremities  Data Reviewed: I have Reviewed nursing notes, Vitals, and Lab results. Since last encounter, pertinent lab results CBC and BMP   . I have ordered test including CBC and BMP  . I have ordered imaging chest x-ray, CT chest, CT abdomen  .   Disposition: Status is: Inpatient Remains inpatient appropriate because: Critically ill.  Prognosis poor.  Place and maintain sequential compression device Start: 09/06/22 1610   Family Communication: Discussed with son on the phone. Level of care: Progressive   Vitals:   09/09/22 0837 09/09/22 1107 09/09/22 1115 09/09/22 1418  BP: (!) 149/98 (!) 140/62 (!) 140/62   Pulse: (!) 105 (!) 106 (!) 142 (!) 132  Resp: (!) 34 (!) 36 (!) 34 (!) 38  Temp: 98 F (36.7  C) 98.4 F (36.9 C)    TempSrc: Axillary Axillary    SpO2: 96% 98% 98%   Weight:      Height:         Author: Lynden Oxford, MD 09/09/2022 4:26 PM  Please look on [email or web address] to find out who  is on call.

## 2022-09-09 NOTE — Progress Notes (Signed)
BP (!) 149/98 (BP Location: Right Arm)   Pulse (!) 105   Temp 98 F (36.7 C) (Axillary)   Resp (!) 34   Ht 6' (1.829 m)   Wt 81.7 kg   SpO2 96%   BMI 24.43 kg/m   Provider notified

## 2022-09-09 NOTE — Progress Notes (Signed)
Patient ID: [name], male   DOB: 1941-03-09, 82 y.o.   MRN: [id number] BP (!) 140/62   Pulse (!) 142   Temp 98.4 F (36.9 C) (Axillary)   Resp (!) 34   Ht 6' (1.829 m)   Wt 81.7 kg   SpO2 98%   BMI 24.43 kg/m  Arouses to voice. Not following commands Mumbling words No neurological change.  Await mri

## 2022-09-09 NOTE — Progress Notes (Signed)
Peripherally Inserted Central Catheter Placement  The IV Nurse has discussed with the patient and/or persons authorized to consent for the patient, the purpose of this procedure and the potential benefits and risks involved with this procedure.  The benefits include less needle sticks, lab draws from the catheter, and the patient may be discharged home with the catheter. Risks include, but not limited to, infection, bleeding, blood clot (thrombus formation), and puncture of an artery; nerve damage and irregular heartbeat and possibility to perform a PICC exchange if needed/ordered by physician.  Alternatives to this procedure were also discussed.  Bard Power PICC patient education guide, fact sheet on infection prevention and patient information card has been provided to patient /or left at bedside. Telephone consent obtained from pt's son, [name].   PICC Placement Documentation  PICC Triple Lumen 09/09/22 Right Basilic 41 cm 0 cm (Active)  Indication for Insertion or Continuance of Line Vasoactive infusions 09/09/22 1610  Exposed Catheter (cm) 0 cm 09/09/22 1610  Site Assessment Clean, Dry, Intact 09/09/22 1610  Lumen #1 Status Saline locked;Blood return noted 09/09/22 1610  Lumen #2 Status Saline locked;Blood return noted 09/09/22 1610  Lumen #3 Status Saline locked;Blood return noted 09/09/22 1610  Dressing Type Transparent;Securing device 09/09/22 1610  Dressing Status Antimicrobial disc in place;Clean, Dry, Intact 09/09/22 1610  Safety Lock Not Applicable 09/09/22 1610  Line Care Connections checked and tightened 09/09/22 1610  Line Adjustment (NICU/IV Team Only) No 09/09/22 1610  Dressing Intervention New dressing 09/09/22 1610  Dressing Change Due 09/16/22 09/09/22 1610       [name] 09/09/2022, 4:13 PM

## 2022-09-09 NOTE — Plan of Care (Signed)
  Problem: Elimination: Goal: Will not experience complications related to urinary retention Outcome: Progressing   Problem: Pain Managment: Goal: General experience of comfort will improve Outcome: Progressing   Problem: Safety: Goal: Ability to remain free from injury will improve Outcome: Progressing   

## 2022-09-10 ENCOUNTER — Inpatient Hospital Stay (HOSPITAL_COMMUNITY): Payer: Medicare Other

## 2022-09-10 DIAGNOSIS — R400 Somnolence: Secondary | ICD-10-CM | POA: Diagnosis not present

## 2022-09-10 DIAGNOSIS — M6282 Rhabdomyolysis: Secondary | ICD-10-CM | POA: Diagnosis not present

## 2022-09-10 DIAGNOSIS — N179 Acute kidney failure, unspecified: Secondary | ICD-10-CM | POA: Diagnosis not present

## 2022-09-10 DIAGNOSIS — A419 Sepsis, unspecified organism: Secondary | ICD-10-CM | POA: Diagnosis not present

## 2022-09-10 DIAGNOSIS — Z7189 Other specified counseling: Secondary | ICD-10-CM | POA: Diagnosis not present

## 2022-09-10 LAB — BASIC METABOLIC PANEL
Anion gap: 10 (ref 5–15)
Anion gap: 12 (ref 5–15)
Anion gap: 15 (ref 5–15)
Anion gap: 15 (ref 5–15)
Anion gap: 18 — ABNORMAL HIGH (ref 5–15)
BUN: 32 mg/dL — ABNORMAL HIGH (ref 8–23)
BUN: 37 mg/dL — ABNORMAL HIGH (ref 8–23)
BUN: 40 mg/dL — ABNORMAL HIGH (ref 8–23)
BUN: 42 mg/dL — ABNORMAL HIGH (ref 8–23)
BUN: 49 mg/dL — ABNORMAL HIGH (ref 8–23)
CO2: 15 mmol/L — ABNORMAL LOW (ref 22–32)
CO2: 18 mmol/L — ABNORMAL LOW (ref 22–32)
CO2: 19 mmol/L — ABNORMAL LOW (ref 22–32)
CO2: 21 mmol/L — ABNORMAL LOW (ref 22–32)
CO2: 21 mmol/L — ABNORMAL LOW (ref 22–32)
Calcium: 5.6 mg/dL — CL (ref 8.9–10.3)
Calcium: 7.4 mg/dL — ABNORMAL LOW (ref 8.9–10.3)
Calcium: 7.5 mg/dL — ABNORMAL LOW (ref 8.9–10.3)
Calcium: 7.8 mg/dL — ABNORMAL LOW (ref 8.9–10.3)
Calcium: 7.9 mg/dL — ABNORMAL LOW (ref 8.9–10.3)
Chloride: 101 mmol/L (ref 98–111)
Chloride: 101 mmol/L (ref 98–111)
Chloride: 112 mmol/L — ABNORMAL HIGH (ref 98–111)
Chloride: 114 mmol/L — ABNORMAL HIGH (ref 98–111)
Chloride: 116 mmol/L — ABNORMAL HIGH (ref 98–111)
Creatinine, Ser: 1.15 mg/dL (ref 0.61–1.24)
Creatinine, Ser: 1.46 mg/dL — ABNORMAL HIGH (ref 0.61–1.24)
Creatinine, Ser: 1.76 mg/dL — ABNORMAL HIGH (ref 0.61–1.24)
Creatinine, Ser: 1.93 mg/dL — ABNORMAL HIGH (ref 0.61–1.24)
Creatinine, Ser: 2.19 mg/dL — ABNORMAL HIGH (ref 0.61–1.24)
GFR, Estimated: 29 mL/min — ABNORMAL LOW (ref >60)
GFR, Estimated: 34 mL/min — ABNORMAL LOW (ref >60)
GFR, Estimated: 38 mL/min — ABNORMAL LOW (ref >60)
GFR, Estimated: 47 mL/min — ABNORMAL LOW (ref >60)
GFR, Estimated: >60 mL/min (ref >60)
Glucose, Bld: 189 mg/dL — ABNORMAL HIGH (ref 70–99)
Glucose, Bld: 213 mg/dL — ABNORMAL HIGH (ref 70–99)
Glucose, Bld: 247 mg/dL — ABNORMAL HIGH (ref 70–99)
Glucose, Bld: 286 mg/dL — ABNORMAL HIGH (ref 70–99)
Glucose, Bld: 572 mg/dL (ref 70–99)
Potassium: 2.9 mmol/L — ABNORMAL LOW (ref 3.5–5.1)
Potassium: 4 mmol/L (ref 3.5–5.1)
Potassium: 4 mmol/L (ref 3.5–5.1)
Potassium: 4.2 mmol/L (ref 3.5–5.1)
Potassium: 4.6 mmol/L (ref 3.5–5.1)
Sodium: 131 mmol/L — ABNORMAL LOW (ref 135–145)
Sodium: 137 mmol/L (ref 135–145)
Sodium: 146 mmol/L — ABNORMAL HIGH (ref 135–145)
Sodium: 147 mmol/L — ABNORMAL HIGH (ref 135–145)
Sodium: 147 mmol/L — ABNORMAL HIGH (ref 135–145)

## 2022-09-10 LAB — CBC
HCT: 37.1 % — ABNORMAL LOW (ref 39.0–52.0)
Hemoglobin: 11.8 g/dL — ABNORMAL LOW (ref 13.0–17.0)
MCH: 33.3 pg (ref 26.0–34.0)
MCHC: 31.8 g/dL (ref 30.0–36.0)
MCV: 104.8 fL — ABNORMAL HIGH (ref 80.0–100.0)
Platelets: 189 10*3/uL (ref 150–400)
RBC: 3.54 MIL/uL — ABNORMAL LOW (ref 4.22–5.81)
RDW: 14.5 % (ref 11.5–15.5)
WBC: 18.4 10*3/uL — ABNORMAL HIGH (ref 4.0–10.5)
nRBC: 0.9 % — ABNORMAL HIGH (ref 0.0–0.2)

## 2022-09-10 LAB — GLUCOSE, CAPILLARY
Glucose-Capillary: 110 mg/dL — ABNORMAL HIGH (ref 70–99)
Glucose-Capillary: 115 mg/dL — ABNORMAL HIGH (ref 70–99)
Glucose-Capillary: 122 mg/dL — ABNORMAL HIGH (ref 70–99)
Glucose-Capillary: 157 mg/dL — ABNORMAL HIGH (ref 70–99)
Glucose-Capillary: 162 mg/dL — ABNORMAL HIGH (ref 70–99)

## 2022-09-10 LAB — DIGOXIN LEVEL
Digoxin Level: >10 ng/mL (ref 0.8–2.0)
Digoxin Level: 5.9 ng/mL (ref 0.8–2.0)

## 2022-09-10 LAB — LACTIC ACID, PLASMA: Lactic Acid, Venous: 3.3 mmol/L (ref 0.5–1.9)

## 2022-09-10 LAB — MAGNESIUM: Magnesium: 2.2 mg/dL (ref 1.7–2.4)

## 2022-09-10 MED ORDER — VANCOMYCIN HCL 1500 MG/300ML IV SOLN
1500.0000 mg | Freq: Once | INTRAVENOUS | Status: AC
  Administered 2022-09-10: 1500 mg via INTRAVENOUS
  Filled 2022-09-10: qty 300

## 2022-09-10 MED ORDER — LORAZEPAM 2 MG/ML IJ SOLN: 1.0000 mg | INTRAMUSCULAR | Status: DC | PRN

## 2022-09-10 MED ORDER — SODIUM CHLORIDE 0.9 % IV SOLN
1.0000 mg/h | INTRAVENOUS | Status: DC
  Administered 2022-09-10: 1 mg/h via INTRAVENOUS
  Filled 2022-09-10: qty 5

## 2022-09-10 MED ORDER — BISACODYL 10 MG RE SUPP: 10.0000 mg | Freq: Every day | RECTAL | Status: DC | PRN

## 2022-09-10 MED ORDER — POTASSIUM CHLORIDE 10 MEQ/100ML IV SOLN: 10.0000 meq | INTRAVENOUS | Status: DC

## 2022-09-10 MED ORDER — CALCIUM GLUCONATE-NACL 2-0.675 GM/100ML-% IV SOLN
2.0000 g | Freq: Once | INTRAVENOUS | Status: DC
  Filled 2022-09-10: qty 100

## 2022-09-10 MED ORDER — HYDROMORPHONE HCL-NACL 50-0.9 MG/50ML-% IV SOLN
1.0000 mg/h | INTRAVENOUS | Status: DC
  Filled 2022-09-10: qty 50

## 2022-09-10 MED ORDER — VANCOMYCIN HCL 1500 MG/300ML IV SOLN: 1500.0000 mg | INTRAVENOUS | Status: DC

## 2022-09-10 MED ORDER — GLYCOPYRROLATE 0.2 MG/ML IJ SOLN: 0.4000 mg | INTRAMUSCULAR | Status: DC | PRN

## 2022-09-10 MED ORDER — ACETAMINOPHEN 650 MG RE SUPP: 650.0000 mg | Freq: Four times a day (QID) | RECTAL | Status: DC | PRN

## 2022-09-10 MED ORDER — ACETAMINOPHEN 650 MG RE SUPP
650.0000 mg | RECTAL | Status: DC | PRN
  Administered 2022-09-10: 650 mg via RECTAL
  Filled 2022-09-10: qty 1

## 2022-09-10 MED ORDER — KCL-LACTATED RINGERS-D5W 20 MEQ/L IV SOLN
INTRAVENOUS | Status: DC
  Filled 2022-09-10: qty 1000

## 2022-09-10 MED ORDER — DEXTROSE-SODIUM CHLORIDE 5-0.45 % IV SOLN: INTRAVENOUS | Status: DC

## 2022-09-10 MED ORDER — DEXTROSE 5 % IV SOLN: INTRAVENOUS | Status: DC

## 2022-09-10 MED ORDER — ONDANSETRON HCL 4 MG/2ML IJ SOLN: 4.0000 mg | Freq: Four times a day (QID) | INTRAMUSCULAR | Status: DC | PRN

## 2022-09-10 MED ORDER — DIPHENHYDRAMINE HCL 50 MG/ML IJ SOLN: 12.5000 mg | INTRAMUSCULAR | Status: DC | PRN

## 2022-09-10 MED ORDER — POLYVINYL ALCOHOL 1.4 % OP SOLN: 1.0000 [drp] | Freq: Four times a day (QID) | OPHTHALMIC | Status: DC | PRN

## 2022-09-10 MED ORDER — HYDROMORPHONE BOLUS VIA INFUSION
1.0000 mg | INTRAVENOUS | Status: DC | PRN
  Administered 2022-09-10: 1 mg via INTRAVENOUS

## 2022-09-10 MED ORDER — BIOTENE DRY MOUTH MT LIQD: 15.0000 mL | OROMUCOSAL | Status: DC | PRN

## 2022-09-10 MED ORDER — FENTANYL CITRATE PF 50 MCG/ML IJ SOSY
12.5000 ug | PREFILLED_SYRINGE | INTRAMUSCULAR | Status: DC | PRN
  Administered 2022-09-10: 12.5 ug via INTRAVENOUS
  Filled 2022-09-10: qty 1

## 2022-09-11 DIAGNOSIS — J9601 Acute respiratory failure with hypoxia: Secondary | ICD-10-CM | POA: Diagnosis not present

## 2022-09-11 LAB — CULTURE, BLOOD (ROUTINE X 2): Special Requests: ADEQUATE

## 2022-09-13 LAB — CULTURE, BLOOD (ROUTINE X 2)

## 2022-09-14 LAB — CULTURE, BLOOD (ROUTINE X 2): Special Requests: ADEQUATE

## 2022-09-14 LAB — ORGANISM ID BY MALDI

## 2022-09-14 LAB — AEROBIC ID BY MALDI

## 2022-09-15 LAB — CULTURE, BLOOD (ROUTINE X 2)
Culture: NO GROWTH
Culture: NO GROWTH

## 2022-09-28 LAB — CULTURE, BLOOD (ROUTINE X 2)

## 2022-09-28 LAB — ORGANISM ID BY MALDI

## 2022-09-28 LAB — AEROBIC BACTERIA, ID BY SEQ.

## 2022-10-04 LAB — ORGANISM ID BY SEQUENCING

## 2022-10-08 NOTE — Progress Notes (Addendum)
Daily Progress Note   Patient Name: [name]       Date: 09/09/2022 DOB: [birth year]  Age: 82 y.o. MRN#: [id number] Attending Physician: Rolly Salter, MD Primary Care Physician: Tresa Garter, MD Admit Date: 08/25/2022  Reason for Consultation/Follow-up: Establishing goals of care and Pain control  Subjective: Chart reviewed including labs, progress notes, imaging from this and previous encounters. Patient assessed at the bedside. He is on BiPAP, tachypneic and moves his eyebrows in response to my voice but unable to open his eyes. No family present during my visit.  I attempted to call patient's son [name] for ongoing GOC discussions but was unable to reach. Left voicemail with PMT contact information and request for a return call.  Addendum: received message from RN with concern about patient's worsening respiratory distress and suffering.  Attempted to call patient's son [name] again and was able to reach him this time.  Provided [name] with updates on current BiPAP settings and overall poor condition.  He tells me that after discussion with patient's attending this morning, he was able to have a discussion with both his brothers.  They have supported him in the unified decision for transition to comfort care. We reviewed with the detailed and he confirmed that he is ready, though he still has some hope that maybe patient will improve and no longer require comfort care.  Review of Systems  Unable to perform ROS: Mental status change    Physical Exam Vitals and nursing note reviewed.  Constitutional:      General: He is not in acute distress.    Appearance: He is ill-appearing.  Cardiovascular:     Rate and Rhythm: Normal rate.  Pulmonary:     Effort: Pulmonary effort is  normal.  Neurological:     Mental Status: He is lethargic and disoriented.     Comments: sleepy  Psychiatric:        Cognition and Memory: Cognition is impaired.             Vital Signs: BP (!) 120/50 (BP Location: Left Arm)   Pulse 79   Temp 98.2 F (36.8 C) (Oral)   Resp (!) 35   Ht 6' (1.829 m)   Wt 80.8 kg   SpO2 93%   BMI 24.16 kg/m  SpO2: SpO2: 93 %  O2 Device: O2 Device: Bi-PAP O2 Flow Rate: O2 Flow Rate (L/min): 2 L/min       Palliative Assessment/Data: PPS: 10%     Palliative Care Assessment & Plan  Patient Profile/HPI:  82 y.o. male  with past medical history of A-Fib on Eliquis, COPD, CAD s/p CABG x 4, PVD with bilateral stents, TIA, macular degeneration, stage II decubitus ulcer admitted on 09/03/2022 after found down at home after fall with altered mental status.    Patient is admitted with acute metabolic encephalopathy, A-fib with RVR, AKI, nontraumatic rhabdomyolysis, worsening odontoid fracture, and sepsis likely secondary to UTI. PMT has been consulted to assist with acute on chronic pain management of fracture as well as goals of care conversation.  Assessment/Recommendations/Plan Continue DNR/DNI Continue current care for now Unable to reach patient's son today Ongoing goals of care discussions, patient has continued to decline over the past several days and is appropriate for comfort focused care PMT will continue to follow and support   Addendum: After discussion with patient's son, I have discontinued all interventions not aimed at providing comfort.  Ordered Dilaudid drip for respiratory distress and additional as needed medications for other symptoms that may arise.  Prognosis:  Poor prognosis, approaching EOL   MDM: High   Cezar Misiaszek Loleta Rose Palliative Medicine Team Team phone # [phone]  Thank you for allowing the Palliative Medicine Team to assist in the care of this patient. Please utilize secure chat with additional questions,  if there is no response within 30 minutes please call the above phone number.  Palliative Medicine Team providers are available by phone from 7am to 7pm daily and can be reached through the team cell phone.  Should this patient require assistance outside of these hours, please call the patient's attending physician.  Portions of this note are a verbal dictation therefore any spelling and/or grammatical errors are due to the "[employer]" system interpretation.

## 2022-10-08 NOTE — Progress Notes (Signed)
Labs done 4: 22 AM erroneous BMP, dig level Repeat labs ordered

## 2022-10-08 NOTE — Progress Notes (Addendum)
Wasted 47 mL of dilaudid bag in stericycle in pyxis A on 2C witnessed by [employer]. Unable to waste in pyxis because medication in pyxis was the 5mL injection and was given an error message saying unable to waste more than medication dose.

## 2022-10-08 NOTE — Progress Notes (Signed)
OT Cancellation Note  Patient Details Name: [name] MRN: [id number] DOB: December 23, 1940   Cancelled Treatment:    Reason Eval/Treat Not Completed: Medical issues which prohibited therapy (nursing recommending OT hold visit due to Bipap. OT to attempt again on later day) Alfonse Flavors, OTA Acute Rehabilitation Services  Office [phone]  Dewain Penning 09/19/2022, 11:44 AM

## 2022-10-08 NOTE — Progress Notes (Signed)
[facility] Progress Note Patient: [name] EAV:[id number] DOB: 10-29-1940 DOA: 08/17/2022  DOS: the patient was seen and examined on 09/29/2022  Brief hospital course: past medical history of A-Fib on Eliquis, COPD, CAD s/p CABG x 4, PVD with bilateral stents, TIA, macular degeneration, stage II decubitus ulcer admitted on 08/09/2022 after found down at home after fall with altered mental status.  Found to have C2 fracture, T9 fracture.  Also had elevated troponin for which patient was started on IV heparin but then developed hematuria secondary to Foley trauma now requiring CBI.  Also found to have SDH. Cardiology, palliative care, IR, neurosurgery, urology consulted. Mentation progressively worsening.  Unable to swallow safely.  Developing possible mucous plugging.  Continues to have abdominal pain and tenderness.  Also developed hypernatremia.  Discussed with family.  Prognosis very poor.  If his condition deteriorates further would recommend transition to complete comfort.  Not stable for kyphoplasty. 6/3 overnight remains tachycardic.  Digoxin level erroneously checked and were elevated.  Multiple lab error seen with regards to sodium level, glucose level.  Renal function worsening.  Leukocytosis.  Respiratory stress not improving requiring BiPAP.   Assessment and Plan: Unwitnessed fall.  Scalp hematoma as well as SDH. Acute metabolic encephalopathy. Ongoing confusion.  Progressive lethargy. Presents with an unwitnessed fall as the family was unable to get in touch with the patient or wife and required a wellness check and forced entry. Patient and wife both were found on the kitchen floor. Admission CT scan of the head did not show any acute abnormality. C-spine showed evidence of C2 and T9 fractures. Also had some scalp hematoma. With ongoing confusion, Possible concussion cannot be ruled out. Repeat CT scan on 5/29 shows SDH although would not explain patient's progressive  lethargy ongoing for last few days. Possible cefepime induced encephalopathy cannot be ruled out as well. While mentation is improving after stopping cefepime, not significant enough as still unable to follow consistent commands or unable to swallow safely. Hypernatremia is also now contributing to encephalopathy.  Now developing AKI which could also contribute.   Acute hypoxic respiratory failure. Possible aspiration pneumonia/pneumonitis Concern for mucous plugging. Concern for sepsis.  Ruled out. Patient had lactic acidosis at the time of admission and there was some concern that he could have an aspiration event with sepsis. Speech therapy following the patient. Initial chest x-ray does not show any significant pneumonia. Repeat chest x-ray on 6/2 shows evidence of bilateral basilar pneumonia. Patient has already completed 7 days of IV antibiotic. Continue with IV Unasyn. Procalcitonin level elevated. Chest x-ray shows no new abnormality.  X-ray abdomen shows no new abnormality.  Unstable for CT chest. Mucomyst and saline nebulization ordered as well. Currently on BiPAP as needed. Possibility of a PE cannot be ruled out although not a candidate for anticoagulation given his SDH and hematuria.   C2 fracture. Discussed with neurosurgery. Recommend conservative measures. No intervention recommended.   T9 fracture. IR consulted.  Kyphoplasty recommended.  Currently unstable to undergo intervention.   Elevated troponin. Acute systolic CHF. Echocardiogram shows EF of 45 to 50%. Troponins were minimally elevated without any ACS pattern. EKG unremarkable. Patient unable to tell me whether he has any angina symptoms or not. Less likely this is ACS.  Most likely troponin elevation is related to his rhabdomyolysis. Patient was treated with IV heparin which was discontinued due to hematuria. Currently not a candidate for antithrombotic therapy or DVT prophylaxis as has SDH. Chest x-ray  shows some vascular  congestion.  Received IV Lasix on 6/2.   Paroxysmal A-fib.  With RVR Initial was on metoprolol without any benefit. Treated with IV amiodarone and remains tachycardic as of 6/2.  Current RVR likely secondary to respiratory distress. IV metoprolol now switched to scheduled dose. Not a candidate for Cardizem therapy. May require digoxin if does not respond to avoid therapy. No recommended for anticoagulation right now but eventual plan was to transition to Eliquis.   Abdominal pain. X-ray abdomen performed earlier did not show any acute abnormality. Unable to perform CT abdomen as the patient is unstable for now. X-ray abdomen negative for any acute pulmonary. Lactic acidosis elevation seen. Already has been covered with cefepime followed by Unasyn throughout the hospital stay.   Rhabdomyolysis. Nontraumatic. Treated with IV fluid. Monitor.   Gross hematuria. From Foley trauma. Patient was trying to pull on his Foley catheter while receiving IV heparin. Had CBI therapy.  Currently urine is clear.   PVD. Will resume Plavix once appropriate given his SDH.   Goals of care conversation. Patient's prognosis is poor. Oral intake is minimal.  Presents with unwitnessed fall and has not improved in his mentation.  Has developed C2 and T9 fracture.  At risk for severe aspiration and most likely have recurrent aspiration pneumonitis causing hypoxia. Also developed acute systolic CHF. Conservatively treated for SDH. Not a candidate for anticoagulation in the setting of severe paroxysmal A-fib with RVR. Has not been able to eat adequately since admission. Feeding tube will not avoid aspiration risk. TPN will also be high risk for infection. Patient's prognosis is poor as able and currently son has agreed for recommendation for DNR. Goal remains to treat what is treatable. Condition continues to deteriorate.  Continues to suffer from respiratory distress as well as spinal  fracture.  Recommending comfort care as patient's chances of surviving the hospital stay is significantly low.   Dysphagia  Speech therapy following.  MBS completed.  Recommended d1 diet nectar thick. At risk for further aspiration  Has minimal oral intake.  Currently will be keeping NPO.   Left ischial tuberosity pressure ulcer stage I, unstageable medial coccyx pressure injury, left foot stage II pressure injury. Present on admission. Continue dressing.   Thrush. Currently receiving IV micafungin. Not a candidate for fluconazole with his prolonged QTc. Unable to tolerate p.o. for now.   Hypernatremia. Receiving IV D5.  From poor p.o. intake with free water deficit. Monitor BMP.   Limited IV access. PICC line ordered. Monitor.   Subjective: No nausea no vomiting no fever no chills.  Unable to come off the BiPAP.-Respiratory distress.  Intermittently moaning.  Still remains tachycardic.  Physical Exam: General: in severe distress, No Rash, oral mucosa dry Cardiovascular: S1 and S2 Present, No Murmur Respiratory: Increased respiratory effort, Bilateral Air entry present.  Bilateral crackles, No wheezes Abdomen: Bowel Sound present, No tenderness Extremities: No edema Neuro: Drowsy and lethargic, unable to follow commands.  Data Reviewed: I have Reviewed nursing notes, Vitals, and Lab results. Since last encounter, pertinent lab results CBC and BMP   . I have ordered test including CBC and BMP and digoxin level  . I have discussed pt's care plan and test results with palliative care  . I have ordered imaging chest x-ray and x-ray abdomen  .  Disposition: Status is: Inpatient Remains inpatient appropriate because: Prognosis poor.  Critically ill. Place and maintain sequential compression device Start: 09/06/22 1610 Family Communication: Discussed with son on the phone. Level of care: Progressive  Vitals:   09/22/2022 0715 09/24/2022 0716 09/19/2022 0722 09/09/2022 1140  BP:     (!) 120/50  Pulse: 97  84 79  Resp: (!) 35  (!) 36 (!) 35  Temp:    98.2 F (36.8 C)  TempSrc:    Oral  SpO2: 98% 99% 98% 93%  Weight:      Height:         Author: Lynden Oxford, MD 09/15/2022 2:25 PM  Please look on [email or web address] to find out who is on call.

## 2022-10-08 NOTE — Progress Notes (Signed)
SLP Cancellation Note  Patient Details Name: [name] MRN: [id number] DOB: [birth year]   Cancelled treatment:        Pt on Bipap and now NPO. Will follow along. Pt will be at increased risk of aspiration. Being followed by Palliative. Discussions for nutrition goals for this pt recommended re: accepting risks of aspiration if wants to continue eating when able (off Bipap).    [name] 09/30/2022, 8:21 AM

## 2022-10-08 NOTE — Progress Notes (Signed)
Provider notified about patient's continued elevated HR and respiratory status.  Pt HR sustaining in the 130-140 with nonsustained increases to 170s. Patient with irregular breathing at times with RR  in the 20s to upper 30s.  Patient responsive to voice and able to weakly squeeze hands on command. Provider came to bedside and placed new orders.   0000-Pt with new onset of elevated temp 101.33F, rectal. Provider placed order for PRN tylenol and blood cultures.

## 2022-10-08 NOTE — Progress Notes (Signed)
Pharmacy Antibiotic Note  [name] is a 82 y.o. male with tachycardia and new fever, possible sepsis.Marland Kitchen  Pharmacy has been consulted for Vancomycin dosing.  Plan: Vancomycin 1500 mg IV q48h  Height: 6' (182.9 cm) Weight: 81.7 kg (180 lb 1.9 oz) IBW/kg (Calculated) : 77.6  Temp (24hrs), Avg:99 F (37.2 C), Min:98 F (36.7 C), Max:101.5 F (38.6 C)  Recent Labs  Lab 09/05/22 0034 09/05/22 1021 09/06/22 0033 09/07/22 0011 09/08/22 0021 09/08/22 0922 09/09/22 0024 09/09/22 0827 09/09/22 1244 09/09/22 1600 09/09/22 1954  WBC 11.9*  --  13.8* 13.0* 11.1*  --  10.5  --   --   --   --   CREATININE 0.93  --  1.01 0.87 1.03   < > 1.05 0.98 1.08 1.23 1.32*  LATICACIDVEN  --  1.8  --   --   --   --   --   --  2.5* 3.2*  --    < > = values in this interval not displayed.    Estimated Creatinine Clearance: 46.5 mL/min (A) (by C-G formula based on SCr of 1.32 mg/dL (H)).    Allergies  Allergen Reactions   Adhesive [Tape] Other (See Comments)    Very sensitive skin, tears easily   Codeine Sulfate Itching and Other (See Comments)    Headaches    Hydrocodone-Acetaminophen Nausea And Vomiting and Other (See Comments)    Headaches    Wound Dressing Adhesive Other (See Comments)    Very sensitive skin, tears easily      Eddie Candle 09/24/2022 12:27 AM

## 2022-10-08 NOTE — Death Summary Note (Signed)
DEATH SUMMARY   Patient Details  Name: [name] MRN: [id number] DOB: 07-08-1940 WNU:[id number], Nathan Quint, MD Admission/Discharge Information   Admit Date:  09-30-2022  Date of Death: Date of Death: 2022/10/10  Time of Death: Time of Death: 1845  Length of Stay: 9   Principle Cause of death: Acute hypoxic respiratory failure secondary to aspiration pneumonia Hospital Diagnoses: Principal Problem:   Acute respiratory failure with hypoxia (HCC) Active Problems:   Ischemic cardiomyopathy   History of transient ischemic attack (TIA)   CAD S/P LM and CFX PCI 05/04/14   PVD (peripheral vascular disease) with claudication (HCC)   COPD (chronic obstructive pulmonary disease) (HCC)   Nicotine dependence   Odontoid fracture (HCC)   Chronic atrial fibrillation with RVR (HCC)   Rhabdomyolysis   AKI (acute kidney injury) (HCC)   Acute encephalopathy   Abnormal LFTs   Sepsis secondary to UTI (HCC)   PAF (paroxysmal atrial fibrillation) (HCC)   Malnutrition of moderate degree   Elevated troponin   Pressure injury of skin Hospital Course: past medical history of A-Fib on Eliquis, COPD, CAD s/p CABG x 4, PVD with bilateral stents, TIA, macular degeneration, stage II decubitus ulcer admitted on 09-30-2022 after found down at home after fall with altered mental status.  Found to have C2 fracture, T9 fracture.  Also had elevated troponin for which patient was started on IV heparin but then developed hematuria secondary to Foley trauma requiring CBI.  Remained confused throughout the hospital stay and on a repeat CT scan was found to have SDH. Cardiology, palliative care, IR, neurosurgery, urology consulted. Mentation progressively worsening.  Unable to swallow safely.  Developing possible mucous plugging.  Continues to have abdominal pain and tenderness.  Due to encephalopathy had poor p.o. intake.  Also developed hypernatremia.   Prognosis very poor.  IR was consulted for kyphoplasty but  due to his respiratory failure not stable for kyphoplasty.  Assessment and Plan: Unwitnessed fall.  Scalp hematoma as well as SDH. Acute metabolic encephalopathy. Ongoing confusion.  Progressive lethargy. Presents with an unwitnessed fall as the family was unable to get in touch with the patient or wife and required a wellness check and forced entry. Patient and wife both were found on the kitchen floor. Admission CT scan of the head did not show any acute abnormality. C-spine showed evidence of C2 and T9 fractures. Also had some scalp hematoma. With ongoing confusion, Possible concussion cannot be ruled out. Repeat CT scan on 5/29 shows SDH although would not explain patient's progressive lethargy ongoing for last few days. Possible cefepime induced encephalopathy cannot be ruled out as well. While mentation is improving after stopping cefepime, not significant enough as still unable to follow consistent commands or unable to swallow safely. Hypernatremia is also now contributing to encephalopathy.  Now developing AKI which could also contribute.   Acute hypoxic respiratory failure. Possible aspiration pneumonia/pneumonitis Concern for mucous plugging. Concern for sepsis.  Ruled out. Patient had lactic acidosis at the time of admission and there was some concern that he could have an aspiration event with sepsis. Speech therapy following the patient. Initial chest x-ray does not show any significant pneumonia. Repeat chest x-ray on 6/2 shows evidence of bilateral basilar pneumonia. Patient has already completed 7 days of IV antibiotic. Treated with IV Unasyn. Procalcitonin level elevated. Chest x-ray shows no new abnormality.  X-ray abdomen shows no new abnormality.  Unstable for CT chest. Mucomyst and saline nebulization ordered as well. Was on BiPAP as  needed. Possibility of a PE cannot be ruled out although not a candidate for anticoagulation given his SDH and hematuria.   C2  fracture. Discussed with neurosurgery. Recommend conservative measures. No intervention recommended.   T9 fracture. IR consulted.  Kyphoplasty recommended.  Currently unstable to undergo intervention.   Elevated troponin. Acute systolic CHF. Echocardiogram shows EF of 45 to 50%. Troponins were minimally elevated without any ACS pattern. EKG unremarkable. Patient unable to tell me whether he has any angina symptoms or not. Less likely this is ACS.  Most likely troponin elevation is related to his rhabdomyolysis. Patient was treated with IV heparin which was discontinued due to hematuria. Currently not a candidate for antithrombotic therapy or DVT prophylaxis as has SDH. Chest x-ray shows some vascular congestion and layering pleural effusion requiring IV Lasix.   Paroxysmal A-fib.  With RVR Initial was on metoprolol without any benefit. Treated with IV amiodarone and remains tachycardic as of 6/2.   Continues to have RVR. Treated with IV metoprolol as well. Received couple of doses of IV digoxin as well. RVR did not resolve.   Abdominal pain. X-ray abdomen performed earlier did not show any acute abnormality. Unable to perform CT abdomen as the patient is unstable for now. X-ray abdomen negative for any acute pulmonary. Lactic acidosis elevation seen. Already has been covered with cefepime followed by Unasyn throughout the hospital stay.   Rhabdomyolysis. Nontraumatic. Treated with IV fluid.   Gross hematuria. From Foley trauma. Patient was trying to pull on his Foley catheter while receiving IV heparin. Had CBI therapy.  Currently urine is clear.   PVD. Was on Plavix prior to admission   Goals of care conversation. Patient had ongoing confusion since admission.  Had poor p.o. intake since admission. Presents with unwitnessed fall and has not improved in his mentation.  Was found to have C2 and T9 fracture as well as SDH. At risk for severe aspiration and most likely  have recurrent aspiration pneumonitis causing hypoxia due to ongoing encephalopathy. Further workup showed that the patient also had elevated troponin as well as acute systolic CHF. Patient was empirically treated with heparin but did not tolerate it due to hematuria and also had ICH. Not a candidate for anticoagulation in the setting of severe paroxysmal A-fib with RVR. Patient's prognosis is poor as able and currently son has agreed for recommendation for DNR. As patient continues to have worsening RVR with A-fib despite being on amiodarone as well as Lopressor and despite receiving IV digoxin with ongoing respiratory distress patient prognosis for surviving the hospital stay was significantly poor.  Discussed with the family.  Palliative care was consulted.  Transition to complete comfort with requirement for IV Dilaudid drip due to respiratory distress.   Dysphagia  Speech therapy was consulted. MBS was done. Recommendation was for D1 diet nectar thick. Also recommendation was that the patient will remain at a very high risk for aspiration, family understood the risk.   Left ischial tuberosity pressure ulcer stage I, unstageable medial coccyx pressure injury, left foot stage II pressure injury. Present on admission.  Foam dressing was recommended.   Thrush. Was treated with IV micafungin as patient was Not a candidate for fluconazole with his prolonged QTc and not a candidate for nystatin.  Due to his dysphagia.   Hypernatremia. From poor p.o. intake with free water deficit. Treated with IV D5 as well as D5 half-normal saline. BMP improved.   Limited IV access. PICC line required.  Elevated digoxin level.  Digoxin level was checked twice, immediately after receiving IV digoxin therefore erroneously elevated.   Procedures: Echocardiogram  Consultations:  Neurosurgery Palliative care Interventional radiology Urology Cardiology  The results of significant diagnostics from this  hospitalization (including imaging, microbiology, ancillary and laboratory) are listed below for reference.   Significant Diagnostic Studies: DG CHEST PORT 1 VIEW  Result Date: 09/14/2022 CLINICAL DATA:  Abdominal pain and shortness of breath. EXAM: PORTABLE CHEST 1 VIEW COMPARISON:  09/09/2022. FINDINGS: Normal sized heart. Post CABG changes. No significant change in dense airspace opacity in the left lower lobe. No significant change in a small right pleural effusion. No acute bony abnormality. The right PICC has been advanced with its tip in the superior vena cava near the superior cavoatrial junction. IMPRESSION: 1. No significant change in left lower lobe pneumonia or atelectasis. 2. Stable small right pleural effusion. Electronically Signed   By: Beckie Salts M.D.   On: 09/21/2022 11:40   DG Abd Portable 1V  Result Date: 09/17/2022 CLINICAL DATA:  Abdominal pain.  Shortness of breath. EXAM: PORTABLE ABDOMEN - 1 VIEW COMPARISON:  09/05/2022 FINDINGS: Single view of the abdomen demonstrates oral contrast in the right colon and transverse colon. This contrast is from recent modified barium swallow examination. Minimal bowel gas in the abdomen. Nonobstructive pattern. IMPRESSION: 1. No acute abnormality.  Nonobstructive pattern. 2. Contrast in the colon. Electronically Signed   By: Richarda Overlie M.D.   On: 09/08/2022 11:19   DG CHEST PORT 1 VIEW  Result Date: 09/09/2022 CLINICAL DATA:  PICC line placement. EXAM: PORTABLE CHEST 1 VIEW COMPARISON:  September 09, 2022 FINDINGS: The right PICC line terminates in the SVC. Bilateral pleural effusions with underlying opacities remain. No other interval changes. IMPRESSION: 1. The right PICC line terminates in the SVC. 2. Bilateral pleural effusions with underlying opacities remain. Electronically Signed   By: Gerome Sam III M.D.   On: 09/09/2022 16:43   [place] EKG SITE RITE  Result Date: 09/09/2022 If Site Rite image not attached, placement could not be confirmed  due to current cardiac rhythm.  DG CHEST PORT 1 VIEW  Result Date: 09/09/2022 CLINICAL DATA:  Shortness of breath EXAM: PORTABLE CHEST 1 VIEW COMPARISON:  Sep 06, 2022 FINDINGS: New bibasilar opacities. The heart, hila, and mediastinum are unremarkable. No pneumothorax. No other acute abnormalities. IMPRESSION: New bibasilar opacities concerning for developing infiltrate/pneumonia. Recommend short-term follow-up imaging to ensure resolution. Electronically Signed   By: Gerome Sam III M.D.   On: 09/09/2022 13:01   DG Swallowing Func-Speech Pathology  Result Date: 09/07/2022 Table formatting from the original result was not included. Modified Barium Swallow Study Patient Details Name: KAISER GUNDLACH MRN: [id number] Date of Birth: [birth year] Today's Date: 09/07/2022 HPI/PMH: HPI: The patient is an 82 year old white male with multiple medical problems who by report was found lying on the floor with his wife covered in stool and urine. He was brought to [facility] and was worked up with a head CT which was unremarkable and a cervical CT which demonstrated a type II odontoid fracture. Patient takes care of his similarly aged wife who has severe dementia and his home is in a state of disrepair and apparently squalor according to the son who lives in [place]. ST recommended D3(mechanical soft)/nectar-thickened liquids and objective assessment prior to d/c.  ST f/u for dysphagia tx/management. Clinical Impression: Clinical Impression: Pt demonstrates mild oral dysphagia marked by mild lingual residue. Pharyngeal dysphagia is moderate with decreased laryngeal elevation, hyoid excursion  and tongue base retraction leading to laryngeal penetration with thin and nectar that he inconsistently sensed and throat cleared which intermittently cleared. Residue in valleculae was max with puree and honey and not decreased with cues for subsequent swallows. Esophageal scan revealed mild stasis in distal  esophagus. Pt is currently weak and deconditioned and solid trial not administered. He is at an increased risk for aspiration with recommendations for continued puree, nectar thick, small sips via cup or straw, multiple swallow and volitional coughs during meal, crush pills. Full supervision and eat when alert and respirations stable. Palliative care is following pt and ST wll continue to follow. Factors that may increase risk of adverse event in presence of aspiration Rubye Oaks [employer] Clearance Coots 2021): No data recorded Recommendations/Plan: Swallowing Evaluation Recommendations Swallowing Evaluation Recommendations Caregiver Recommendations: Have oral suction available Treatment Plan No data recorded Recommendations Recommendations for follow up therapy are one component of a multi-disciplinary discharge planning process, led by the attending physician.  Recommendations may be updated based on patient status, additional functional criteria and insurance authorization. Assessment: Orofacial Exam: Orofacial Exam Oral Cavity: Oral Hygiene: Dried secretions Oral Cavity - Dentition: Other (Comment) (upper partial) Orofacial Anatomy: WFL Oral Motor/Sensory Function: WFL Anatomy: Anatomy: WFL Boluses Administered: Boluses Administered Boluses Administered: Thin liquids (Level 0); Mildly thick liquids (Level 2, nectar thick); Moderately thick liquids (Level 3, honey thick); Puree  Oral Impairment Domain: Oral Impairment Domain Lip Closure: No labial escape Tongue control during bolus hold: Cohesive bolus between tongue to palatal seal Bolus preparation/mastication: -- (not assessed) Bolus transport/lingual motion: Brisk tongue motion Oral residue: Trace residue lining oral structures Location of oral residue : Tongue Initiation of pharyngeal swallow : Valleculae  Pharyngeal Impairment Domain: Pharyngeal Impairment Domain Soft palate elevation: No bolus between soft palate (SP)/pharyngeal wall (PW) Laryngeal elevation: Partial  superior movement of thyroid cartilage/partial approximation of arytenoids to epiglottic petiole Anterior hyoid excursion: Partial anterior movement Epiglottic movement: Complete inversion Laryngeal vestibule closure: Incomplete, narrow column air/contrast in laryngeal vestibule Pharyngeal stripping wave : Present - complete Pharyngeal contraction (A/P view only): N/A Pharyngoesophageal segment opening: Complete distension and complete duration, no obstruction of flow Tongue base retraction: Narrow column of contrast or air between tongue base and PPW Pharyngeal residue: Collection of residue within or on pharyngeal structures Location of pharyngeal residue: Valleculae; Tongue base; Pyriform sinuses  Esophageal Impairment Domain: Esophageal Impairment Domain Esophageal clearance upright position: Esophageal retention Pill: Esophageal Impairment Domain Esophageal clearance upright position: Esophageal retention Penetration/Aspiration Scale Score: Penetration/Aspiration Scale Score 3.  Material enters airway, remains ABOVE vocal cords and not ejected out: Thin liquids (Level 0); Mildly thick liquids (Level 2, nectar thick) Compensatory Strategies: Compensatory Strategies Compensatory strategies: Yes Multiple swallows: Ineffective Ineffective Multiple Swallows: Puree; Moderately thick liquid (Level 3, honey thick); Mildly thick liquid (Level 2, nectar thick)   General Information: Caregiver present: No  Diet Prior to this Study: Dysphagia 1 (pureed); Mildly thick liquids (Level 2, nectar thick)   Temperature : Normal   Respiratory Status: WFL   Supplemental O2: Nasal cannula   History of Recent Intubation: No  Behavior/Cognition: Alert; Cooperative; Pleasant mood; Requires cueing Self-Feeding Abilities: Dependent for feeding Baseline vocal quality/speech: Hypophonia/low volume Volitional Cough: Able to elicit Volitional Swallow: Able to elicit No data recorded Goal Planning: Prognosis for improved oropharyngeal  function: Good No data recorded No data recorded No data recorded No data recorded Pain: Pain Assessment Pain Assessment: Faces Faces Pain Scale: 4 Breathing: 1 Negative Vocalization: 1 Facial Expression: 1 Body Language: 1  Consolability: 1 PAINAD Score: 5 Pain Location: Back/neck Pain Descriptors / Indicators: Moaning; Grimacing Pain Intervention(s): Monitored during session End of Session: Start Time:SLP Start Time (ACUTE ONLY): 1344 Stop Time: SLP Stop Time (ACUTE ONLY): 1400 Time Calculation:SLP Time Calculation (min) (ACUTE ONLY): 16 min Charges: SLP Evaluations $ SLP Speech Visit: 1 Visit SLP Evaluations $BSS Swallow: 1 Procedure $Swallowing Treatment: 1 Procedure SLP visit diagnosis: SLP Visit Diagnosis: Dysphagia, oropharyngeal phase (R13.12) Past Medical History: Past Medical History: Diagnosis Date  ALLERGIC RHINITIS   Atrial flutter (HCC)   CAD (coronary artery disease)   CAD s/p CABG 1993 and redo 2016,  Carotid artery disease (HCC)   bilateral  Chronic combined systolic and diastolic CHF (congestive heart failure) (HCC)   CKD (chronic kidney disease), stage II   COPD (chronic obstructive pulmonary disease) (HCC)   Diverticulosis   Hyperlipidemia   Osteoarthritis   PAF (paroxysmal atrial fibrillation) (HCC)   Peripheral neuropathy   Personal history of prostate cancer   PVD (peripheral vascular disease) with claudication (HCC)   Radiation proctitis   STEMI (ST elevation myocardial infarction) (HCC) 04/29/2014  PTCA [place] and CFX, in setting of VF/VT arrest and CGS  Tobacco use disorder, continuous   Ventral hernia  Past Surgical History: Past Surgical History: Procedure Laterality Date  APPENDECTOMY    CARDIOVERSION N/A 06/24/2014  Procedure: CARDIOVERSION;  Surgeon: Chrystie Nose, MD;  Location: [facility] ENDOSCOPY;  Service: Cardiovascular;  Laterality: N/A;  CORONARY ARTERY BYPASS GRAFT  1992  CORONARY ARTERY BYPASS GRAFT N/A 05/28/2014  Procedure: REDO CORONARY ARTERY BYPASS GRAFTING (CABG);  Surgeon:  Delight Ovens, MD;  Location: [facility] OR;  Service: Open Heart Surgery;  Laterality: N/A;  Times 4 using right internal mammary artery to Intermediate artery and endoscopically harvested left saphenous vein to LAD, OM1, and PD coronary arteries.  LEFT HEART CATHETERIZATION WITH CORONARY ANGIOGRAM N/A 04/30/2014  Procedure: LEFT HEART CATHETERIZATION WITH CORONARY ANGIOGRAM;  Surgeon: Peter M [place], MD; Lmain 90% (s/p PTCA), LAD 80%, CFX 100% (s/p PTCA), RCA OK, LIMA-LAD atretic, SVG-OM 100% (chronic), EF 45%, pt req defib x 13 for VF/VT arrest, CHB w/ tem pacer, IABP and intubation  LUMBAR LAMINECTOMY  [id number]   X2  PROSTATECTOMY  05/2008  Dr. Laverle Patter and XRT  TEE WITHOUT CARDIOVERSION N/A 05/28/2014  Procedure: TRANSESOPHAGEAL ECHOCARDIOGRAM (TEE);  Surgeon: Delight Ovens, MD;  Location: [facility] OR;  Service: Open Heart Surgery;  Laterality: N/A;  TEE WITHOUT CARDIOVERSION N/A 06/24/2014  Procedure: TRANSESOPHAGEAL ECHOCARDIOGRAM (TEE);  Surgeon: Chrystie Nose, MD;  Location: [facility] ENDOSCOPY;  Service: Cardiovascular;  Laterality: N/A;  TONSILLECTOMY   [name] 09/07/2022, 2:44 PM  EEG adult  Result Date: 09/06/2022 Charlsie Quest, MD     09/06/2022  2:40 PM Patient Name: KAVION HOTTINGER MRN: [id number] Epilepsy Attending: Charlsie Quest Referring Physician/Provider: Rolly Salter, MD Date: 09/06/2022 Duration: 23.07 mins Patient history: 82 year old male with altered mental status getting EEG to evaluate for seizure Level of alertness: Awake AEDs during EEG study: None Technical aspects: This EEG study was done with scalp electrodes positioned according to the 10-20 International system of electrode placement. Electrical activity was reviewed with band pass filter of 1-70Hz , sensitivity of 7 uV/mm, display speed of 58mm/sec with a 60Hz  notched filter applied as appropriate. EEG data were recorded continuously and digitally stored.  Video monitoring was available and reviewed as appropriate.  Description: No posterior dominant rhythm was seen. EEG showed continuous generalized 3 to 6 Hz theta-delta slowing. Hyperventilation  and photic stimulation were not performed.   ABNORMALITY - Continuous slow, generalized IMPRESSION: This study is suggestive of moderate diffuse encephalopathy, nonspecific etiology. No seizures or epileptiform discharges were seen throughout the recording. Priyanka [name]   DG CHEST PORT 1 VIEW  Result Date: 09/06/2022 CLINICAL DATA:  Hypoxia EXAM: PORTABLE CHEST 1 VIEW COMPARISON:  Chest radiograph 1 day prior FINDINGS: Median sternotomy wires and mediastinal surgical clips are stable. The heart size is stable. The upper mediastinal contours are normal There is persistent opacity in the right base which may reflect atelectasis or pneumonia, stable to slightly improved. Linear opacity in the lingula likely reflects atelectasis. There is no other new or worsening focal airspace disease. There is no pulmonary edema. There is no pleural effusion or pneumothorax. There is no acute osseous abnormality. IMPRESSION: Stable to slightly improved opacities in the right base which may reflect atelectasis or pneumonia and probable atelectasis in the lingula. Electronically Signed   By: Lesia Hausen M.D.   On: 09/06/2022 08:48   DG Abd Portable 1V  Result Date: 09/05/2022 CLINICAL DATA:  Abdominal pain EXAM: PORTABLE ABDOMEN - 1 VIEW COMPARISON:  None Available. FINDINGS: Scattered large and small bowel gas is noted. No obstructive changes are seen. No free air is seen. Diffuse vascular calcifications are noted. No bony abnormality is seen. IMPRESSION: No obstructive changes noted. Electronically Signed   By: Alcide Clever M.D.   On: 09/05/2022 20:29   DG CHEST PORT 1 VIEW  Result Date: 09/05/2022 CLINICAL DATA:  Shortness of breath EXAM: PORTABLE CHEST 1 VIEW COMPARISON:  09/04/2022 FINDINGS: Cardiac shadow is stable. Aortic calcifications are seen. Postsurgical changes are noted.  Improved aeration is noted in the left base when compare with the prior exam although increasing opacity in the right base is seen. No sizable effusion is noted. No bony abnormality is noted. IMPRESSION: Interval clearing in the left base with developing right basilar opacity which may represent atelectasis and or infiltrate. Electronically Signed   By: Alcide Clever M.D.   On: 09/05/2022 17:29   CT HEAD WO CONTRAST ( )  Addendum Date: 09/05/2022   ADDENDUM REPORT: 09/05/2022 10:09 ADDENDUM: Study discussed by telephone with Dr. Edsel Petrin Lissa Rowles on 09/05/2022 at 1004 hours. Electronically Signed   By: Odessa Fleming M.D.   On: 09/05/2022 10:09   Result Date: 09/05/2022 CLINICAL DATA:  82 year old male with altered mental status. EXAM: CT HEAD WITHOUT CONTRAST TECHNIQUE: Contiguous axial images were obtained from the base of the skull through the vertex without intravenous contrast. RADIATION DOSE REDUCTION: This exam was performed according to the departmental dose-optimization program which includes automated exposure control, adjustment of the mA and/or kV according to patient size and/or use of iterative reconstruction technique. COMPARISON:  Head CT 09/04/2022.  Brain MRI 04/21/2009. FINDINGS: Brain: Cerebral volume is within normal limits for age. Is stable and within normal limits. New isodense to hypodense small bilateral vertex subdural hematomas (coronal images 32 through 39) measuring 3-4 mm in thickness bilaterally. No significant intracranial mass effect. No midline shift. No ventriculomegaly. No other intracranial hemorrhage identified. Basilar cisterns remain normal. Gray-white matter differentiation Vascular: No suspicious intracranial vascular hyperdensity. Calcified atherosclerosis at the skull base. Skull: Stable visualized osseous structures. Sinuses/Orbits: Visualized paranasal sinuses and mastoids are stable and well aerated. Other: Largely resolved scalp hematoma since 09/05/2022. Orbits soft  tissues appears stable, negative. IMPRESSION: 1. Evidence of small new bilateral superior convexity Subdural hematomas, 3-4 mm in thickness. But no intracranial mass effect. No skull  fracture identified. And largely resolved scalp hematoma since 08/27/2022. 2. No other acute intracranial abnormality. Electronically Signed: By: Odessa Fleming M.D. On: 09/05/2022 09:46   DG CHEST PORT 1 VIEW  Result Date: 09/04/2022 CLINICAL DATA:  [id number] with shortness of breath. EXAM: PORTABLE CHEST 1 VIEW COMPARISON:  Chest CT and portable chest both 09/02/2022 FINDINGS: 3:17 a.m. Multiple overlying monitor wires. There are hazy opacities increased in the left lower lung field which could be atelectasis, pneumonia or combination. Remainder of the lungs are clear. There is mild cardiomegaly with sternotomy and CABG changes without evidence of CHF or pleural collections. Degenerative change thoracic spine. IMPRESSION: 1. Increased hazy opacities in the left lower lung field which could be atelectasis, pneumonia or combination. 2. Mild cardiomegaly with sternotomy and CABG changes. Electronically Signed   By: Almira Bar M.D.   On: 09/04/2022 03:27   ECHOCARDIOGRAM COMPLETE  Result Date: 09/02/2022    ECHOCARDIOGRAM REPORT   Patient Name:   OSBORNE COPUS Date of Exam: 09/02/2022 Medical Rec #:  [id number]            Height:       72.0 in Accession #:    [id number]           Weight:       162.0 lb Date of Birth:  1940-12-27            BSA:          1.948 m Patient Age:    81 years             BP:           124/91 mmHg Patient Gender: M                    HR:           88 bpm. Exam Location:  Inpatient Procedure: 2D Echo, Cardiac Doppler and Color Doppler Indications:     Congestive heart failure  History:         Patient has prior history of Echocardiogram examinations, most                  recent 06/24/2014. CHF, CAD, COPD and TIA, Arrythmias:Atrial                  Fibrillation; Risk Factors:Current Smoker.  Sonographer:      Lucy Antigua Referring Phys:  [id number] Rhetta Mura Diagnosing Phys: Thurmon Fair MD IMPRESSIONS  1. Left ventricular ejection fraction, by estimation, is 35 to 40%. The left ventricle has moderately decreased function. The left ventricle demonstrates regional wall motion abnormalities (see scoring diagram/findings for description). Left ventricular  diastolic function could not be evaluated.  2. Right ventricular systolic function is mildly reduced. The right ventricular size is normal. There is normal pulmonary artery systolic pressure.  3. Left atrial size was moderately dilated.  4. Right atrial size was mildly dilated.  5. The mitral valve is normal in structure. Mild to moderate mitral valve regurgitation.  6. The aortic valve is tricuspid. There is mild calcification of the aortic valve. There is mild thickening of the aortic valve. Aortic valve regurgitation is not visualized. Aortic valve sclerosis/calcification is present, without any evidence of aortic stenosis.  7. The inferior vena cava is dilated in size with >50% respiratory variability, suggesting right atrial pressure of 8 mmHg. Comparison(s): Prior images reviewed side by side. The left ventricular function is worsened. The left ventricular wall motion abnormalities are  worse. There is evidence of interval infarction in the distribution of the right coronary artery and distal left circumflex artery. FINDINGS  Left Ventricle: Left ventricular ejection fraction, by estimation, is 35 to 40%. The left ventricle has moderately decreased function. The left ventricle demonstrates regional wall motion abnormalities. The left ventricular internal cavity size was normal in size. There is no left ventricular hypertrophy. Left ventricular diastolic function could not be evaluated due to atrial fibrillation. Left ventricular diastolic function could not be evaluated.  LV Wall Scoring: The inferior wall and posterior wall are akinetic. The antero-lateral  wall and basal inferoseptal segment are hypokinetic. The entire anterior wall, entire anterior septum, entire apex, and mid inferoseptal segment are normal. Right Ventricle: The right ventricular size is normal. Right vetricular wall thickness was not well visualized. Right ventricular systolic function is mildly reduced. There is normal pulmonary artery systolic pressure. The tricuspid regurgitant velocity is 1.82 m/s, and with an assumed right atrial pressure of 8 mmHg, the estimated right ventricular systolic pressure is 21.2 mmHg. Left Atrium: Left atrial size was moderately dilated. Right Atrium: Right atrial size was mildly dilated. Pericardium: There is no evidence of pericardial effusion. Mitral Valve: The mitral valve is normal in structure. Mild to moderate mitral valve regurgitation. Tricuspid Valve: The tricuspid valve is normal in structure. Tricuspid valve regurgitation is trivial. Aortic Valve: The aortic valve is tricuspid. There is mild calcification of the aortic valve. There is mild thickening of the aortic valve. Aortic valve regurgitation is not visualized. Aortic valve sclerosis/calcification is present, without any evidence of aortic stenosis. Aortic valve mean gradient measures 3.0 mmHg. Aortic valve peak gradient measures 6.0 mmHg. Aortic valve area, by VTI measures 1.68 cm. Pulmonic Valve: The pulmonic valve was grossly normal. Pulmonic valve regurgitation is not visualized. Aorta: The aortic root and ascending aorta are structurally normal, with no evidence of dilitation. Venous: The inferior vena cava is dilated in size with greater than 50% respiratory variability, suggesting right atrial pressure of 8 mmHg. IAS/Shunts: No atrial level shunt detected by color flow Doppler.  LEFT VENTRICLE PLAX 2D LVIDd:         4.60 cm   Diastology LVIDs:         4.20 cm   LV e' medial:    7.83 cm/s LV PW:         1.10 cm   LV E/e' medial:  10.6 LV IVS:        1.00 cm   LV e' lateral:   7.94 cm/s LVOT  diam:     1.80 cm   LV E/e' lateral: 10.4 LV SV:         34 LV SV Index:   18 LVOT Area:     2.54 cm  RIGHT VENTRICLE RV S prime:     5.77 cm/s TAPSE (M-mode): 2.1 cm LEFT ATRIUM             Index        RIGHT ATRIUM           Index LA diam:        4.41 cm 2.26 cm/m   RA Area:     18.70 cm LA Vol (A2C):   47.3 ml 24.28 ml/m  RA Volume:   50.80 ml  26.07 ml/m LA Vol (A4C):   61.8 ml 31.72 ml/m LA Biplane Vol: 55.4 ml 28.44 ml/m  AORTIC VALVE AV Area (Vmax):    1.48 cm AV Area (Vmean):   1.73 cm  AV Area (VTI):     1.68 cm AV Vmax:           122.00 cm/s AV Vmean:          74.500 cm/s AV VTI:            0.203 m AV Peak Grad:      6.0 mmHg AV Mean Grad:      3.0 mmHg LVOT Vmax:         71.10 cm/s LVOT Vmean:        50.700 cm/s LVOT VTI:          0.134 m LVOT/AV VTI ratio: 0.66  AORTA Ao Root diam: 2.80 cm Ao Asc diam:  3.70 cm MITRAL VALVE               TRICUSPID VALVE MV Area (PHT): 4.46 cm    TR Peak grad:   13.2 mmHg MV Decel Time: 170 msec    TR Vmax:        182.00 cm/s MV E velocity: 82.70 cm/s                            SHUNTS                            Systemic VTI:  0.13 m                            Systemic Diam: 1.80 cm Rachelle Hora Croitoru MD Electronically signed by Thurmon Fair MD Signature Date/Time: 09/02/2022/10:26:59 AM    Final (Updated)    CT HIP LEFT WO CONTRAST  Result Date: 09/01/2022 CLINICAL DATA:  Left hip pain after fall. EXAM: CT OF THE LEFT HIP WITHOUT CONTRAST TECHNIQUE: Multidetector CT imaging of the left hip was performed according to the standard protocol. Multiplanar CT image reconstructions were also generated. RADIATION DOSE REDUCTION: This exam was performed according to the departmental dose-optimization program which includes automated exposure control, adjustment of the mA and/or kV according to patient size and/or use of iterative reconstruction technique. COMPARISON:  CT abdomen pelvis from yesterday. FINDINGS: Bones/Joint/Cartilage No fracture or dislocation. Mild left  hip osteoarthritis. Trace left hip joint effusion. Ligaments Ligaments are suboptimally evaluated by CT. Muscles and Tendons Grossly intact. Soft tissue No fluid collection or hematoma. No soft tissue mass. Bladder decompressed by Foley catheter. IMPRESSION: 1. No acute osseous abnormality. 2. Mild left hip osteoarthritis. Electronically Signed   By: Obie Dredge M.D.   On: 09/01/2022 16:10   CT CHEST ABDOMEN PELVIS W CONTRAST  Result Date: 08/23/2022 CLINICAL DATA:  Sepsis EXAM: CT CHEST, ABDOMEN, AND PELVIS WITH CONTRAST TECHNIQUE: Multidetector CT imaging of the chest, abdomen and pelvis was performed following the standard protocol during bolus administration of intravenous contrast. RADIATION DOSE REDUCTION: This exam was performed according to the departmental dose-optimization program which includes automated exposure control, adjustment of the mA and/or kV according to patient size and/or use of iterative reconstruction technique. CONTRAST:  75mL OMNIPAQUE IOHEXOL 350 MG/ML SOLN COMPARISON:  None Available. FINDINGS: CHEST: Cardiovascular: No aortic injury. The thoracic aorta is normal in caliber. The heart is normal in size. No significant pericardial effusion. Severe atherosclerotic plaque. The main pulmonary artery is normal in caliber. Four-vessel coronary calcifications status post coronary artery bypass graft. No central pulmonary embolus. Mediastinum/Nodes: No pneumomediastinum. No mediastinal hematoma. The esophagus is unremarkable. The  thyroid is unremarkable. The central airways are patent. No mediastinal, hilar, or axillary lymphadenopathy. Lungs/Pleura: Emphysematous changes. No focal consolidation. No pulmonary nodule. No pulmonary mass. No pulmonary contusion or laceration. No pneumatocele formation. No pleural effusion. No pneumothorax. No hemothorax. Musculoskeletal/Chest wall: No chest wall mass.  Trace bilateral gynecomastia. No acute rib or sternal fracture. Chronic T7, T8, and T9  spinous process fractures. Associated chronic appearing burst fracture of the T9 vertebral body. No retropulsion into the central canal. Approximately at least 40% vertebral body height loss centrally. ABDOMEN / PELVIS: Hepatobiliary: Not enlarged. Vague slightly nodular hypodensity along the falciform ligament also noted on delayed imaging suggestive of focal fatty infiltration. No laceration or subcapsular hematoma. Cholelithiasis.  No biliary ductal dilatation. Pancreas: Normal pancreatic contour. No main pancreatic duct dilatation. Spleen: Not enlarged. No focal lesion. No laceration, subcapsular hematoma, or vascular injury. Adrenals/Urinary Tract: No nodularity bilaterally. Calcification associated with the kidneys likely vascular. No obstructive nephrolithiasis. Bilateral kidneys enhance symmetrically. No hydronephrosis. No contusion, laceration, or subcapsular hematoma. No injury to the vascular structures or collecting systems. No hydroureter. The urinary bladder is unremarkable. Stomach/Bowel: No small or large bowel wall thickening or dilatation. The appendix is not definitely identified with no inflammatory changes in the right lower quadrant to suggest acute appendicitis. Vasculature/Lymphatics: Severe atherosclerotic plaque. No abdominal aorta or iliac aneurysm. No active contrast extravasation or pseudoaneurysm. No abdominal, pelvic, inguinal lymphadenopathy. Reproductive: Normal. Other: No simple free fluid ascites. No pneumoperitoneum. No hemoperitoneum. No mesenteric hematoma identified. No organized fluid collection. Musculoskeletal: No significant soft tissue hematoma. No acute pelvic fracture.  No acute spinal fracture. Ports and Devices: None. IMPRESSION: 1. No acute intrathoracic, intra-abdominal, intrapelvic traumatic injury. 2. Chronic T9 burst fracture-correlate with point tenderness to palpation to evaluate for an acute component. Otherwise no definite acute fracture or traumatic  malalignment of the thoracic or lumbar spine. 3. Other imaging findings of potential clinical significance: Cholelithiasis with no acute cholecystitis. Stool throughout the colon-correlate for constipation. Aortic Atherosclerosis (ICD10-I70.0) and Emphysema (ICD10-J43.9). Electronically Signed   By: Tish Frederickson M.D.   On: 09/03/2022 22:57   CT Head Wo Contrast  Result Date: 08/09/2022 CLINICAL DATA:  Neck trauma (Age >= 65y); Mental status change, unknown cause EXAM: CT HEAD WITHOUT CONTRAST CT CERVICAL SPINE WITHOUT CONTRAST TECHNIQUE: Multidetector CT imaging of the head and cervical spine was performed following the standard protocol without intravenous contrast. Multiplanar CT image reconstructions of the cervical spine were also generated. RADIATION DOSE REDUCTION: This exam was performed according to the departmental dose-optimization program which includes automated exposure control, adjustment of the mA and/or kV according to patient size and/or use of iterative reconstruction technique. COMPARISON:  CT head and C-spine 10/23/2021 FINDINGS: CT HEAD FINDINGS Brain: Cerebral ventricle sizes are concordant with the degree of cerebral volume loss. Trace patchy and confluent areas of decreased attenuation are noted throughout the deep and periventricular white matter of the cerebral hemispheres bilaterally, compatible with chronic microvascular ischemic disease. No evidence of large-territorial acute infarction. No parenchymal hemorrhage. No mass lesion. No extra-axial collection. No mass effect or midline shift. No hydrocephalus. Basilar cisterns are patent. Vascular: No hyperdense vessel. Skull: No acute fracture or focal lesion. Sinuses/Orbits: Paranasal sinuses and mastoid air cells are clear. The orbits are unremarkable. Other: 6 mm left frontal scalp hematoma. 13 mm right parieto-occipital scalp hematoma formation. CT CERVICAL SPINE FINDINGS Alignment: Normal. Skull base and vertebrae: Chronic C2  dens fracture with increased separation of the fracture fragments now up to 3 mm (  from less than 1 mm). No aggressive appearing focal osseous lesion or focal pathologic process. Soft tissues and spinal canal: No prevertebral fluid or swelling. No visible canal hematoma. Upper chest: Emphysematous changes. Other: Atherosclerotic plaque of the carotid arteries within the neck. IMPRESSION: 1.  No acute intracranial abnormality. 2. Chronic C2 dens fracture with increased separation of the fracture fragments up to 3 mm (from less than 1 mm). Findings may be acute versus chronic. If clinically indicated, consider MRI cervical spine for further evaluation. 3. Otherwise no acute displaced fracture or traumatic listhesis of the cervical spine. 4. A 6 mm left frontal scalp hematoma. 5. A 13 mm right parieto-occipital scalp hematoma formation. 6. Emphysema (ICD10-J43.9). Electronically Signed   By: Tish Frederickson M.D.   On: 08/23/2022 22:37   CT Cervical Spine Wo Contrast  Result Date: 08/13/2022 CLINICAL DATA:  Neck trauma (Age >= 65y); Mental status change, unknown cause EXAM: CT HEAD WITHOUT CONTRAST CT CERVICAL SPINE WITHOUT CONTRAST TECHNIQUE: Multidetector CT imaging of the head and cervical spine was performed following the standard protocol without intravenous contrast. Multiplanar CT image reconstructions of the cervical spine were also generated. RADIATION DOSE REDUCTION: This exam was performed according to the departmental dose-optimization program which includes automated exposure control, adjustment of the mA and/or kV according to patient size and/or use of iterative reconstruction technique. COMPARISON:  CT head and C-spine 10/23/2021 FINDINGS: CT HEAD FINDINGS Brain: Cerebral ventricle sizes are concordant with the degree of cerebral volume loss. Trace patchy and confluent areas of decreased attenuation are noted throughout the deep and periventricular white matter of the cerebral hemispheres bilaterally,  compatible with chronic microvascular ischemic disease. No evidence of large-territorial acute infarction. No parenchymal hemorrhage. No mass lesion. No extra-axial collection. No mass effect or midline shift. No hydrocephalus. Basilar cisterns are patent. Vascular: No hyperdense vessel. Skull: No acute fracture or focal lesion. Sinuses/Orbits: Paranasal sinuses and mastoid air cells are clear. The orbits are unremarkable. Other: 6 mm left frontal scalp hematoma. 13 mm right parieto-occipital scalp hematoma formation. CT CERVICAL SPINE FINDINGS Alignment: Normal. Skull base and vertebrae: Chronic C2 dens fracture with increased separation of the fracture fragments now up to 3 mm (from less than 1 mm). No aggressive appearing focal osseous lesion or focal pathologic process. Soft tissues and spinal canal: No prevertebral fluid or swelling. No visible canal hematoma. Upper chest: Emphysematous changes. Other: Atherosclerotic plaque of the carotid arteries within the neck. IMPRESSION: 1.  No acute intracranial abnormality. 2. Chronic C2 dens fracture with increased separation of the fracture fragments up to 3 mm (from less than 1 mm). Findings may be acute versus chronic. If clinically indicated, consider MRI cervical spine for further evaluation. 3. Otherwise no acute displaced fracture or traumatic listhesis of the cervical spine. 4. A 6 mm left frontal scalp hematoma. 5. A 13 mm right parieto-occipital scalp hematoma formation. 6. Emphysema (ICD10-J43.9). Electronically Signed   By: Tish Frederickson M.D.   On: 08/25/2022 22:37   DG Pelvis Portable  Result Date: 09/03/2022 CLINICAL DATA:  Fall, weakness. EXAM: PORTABLE PELVIS 1-2 VIEWS COMPARISON:  None Available. FINDINGS: The cortical margins of the bony pelvis are intact. No fracture. Pubic symphysis and sacroiliac joints are congruent. Both femoral heads are well-seated in the respective acetabula. External artifact projects over the lateral left hip.  Extensive arterial vascular calcifications. IMPRESSION: No pelvic fracture. Extensive arterial vascular calcifications. Electronically Signed   By: Narda Rutherford M.D.   On: 08/21/2022 20:38   DG Chest  Port 1 View  Result Date: 08/18/2022 CLINICAL DATA:  Weakness nose, fall. EXAM: PORTABLE CHEST 1 VIEW COMPARISON:  Radiograph 06/08/2015 FINDINGS: Prior median sternotomy and CABG. Stable heart size allowing for lower lung volumes. Unchanged mediastinal contours. Aortic atherosclerosis. Patchy airspace opacity at the retrocardiac left lung base. No pneumothorax or large pleural effusion. On limited assessment, no acute osseous abnormalities are seen. IMPRESSION: 1. Patchy airspace opacity at the retrocardiac left lung base. This may represent pneumonia, although possibility of pulmonary contusion is considered in the setting of fall. 2. Prior CABG. Electronically Signed   By: Narda Rutherford M.D.   On: 08/23/2022 20:37    Microbiology: Recent Results (from the past 240 hour(s))  Culture, blood (Routine X 2) w Reflex to ID Panel     Status: None (Preliminary result)   Collection Time: 09/24/2022 12:48 AM   Specimen: BLOOD LEFT HAND  Result Value Ref Range Status   Specimen Description BLOOD LEFT HAND  Final   Special Requests   Final    BOTTLES DRAWN AEROBIC AND ANAEROBIC Blood Culture adequate volume   Culture   Final    NO GROWTH 1 DAY Performed at [facility], [address], [place], [place] [address]    Report Status PENDING  Incomplete  Culture, blood (Routine X 2) w Reflex to ID Panel     Status: None (Preliminary result)   Collection Time: 09/25/2022 12:48 AM   Specimen: BLOOD LEFT HAND  Result Value Ref Range Status   Specimen Description BLOOD LEFT HAND  Final   Special Requests   Final    BOTTLES DRAWN AEROBIC AND ANAEROBIC Blood Culture adequate volume   Culture   Final    NO GROWTH 1 DAY Performed at [facility], [address], [place], [place] [address]     Report Status PENDING  Incomplete    Time spent: 35 minutes  Signed: Lynden Oxford, MD 09/28/2022

## 2022-10-08 NOTE — Progress Notes (Signed)
NIR was requested for image guided T9 KP.   Case was reviewed by Dr. Corliss Skains and anatomy is amenable for KP, insurance approval has been obtained and informed consent was obtained from patient's son. The procedure had been postponed due to leukocytosis on 5/28 currently being treated for possible UTI and/or aspiration PNA, a-fib, and patient's overall condition, he has been on BIPAP.    There has been GOC discussion with the family and transiting to complete comfort care if condition deteriorates further was recommended.   CT CAP ordered yesterday but patient is too unstable to come down for the scan, it is doubtful that patient can tolerate KP which requires prolonged prone position.    NIR will sign off, please call NIR for questions and concerns.   [name] H Nevelyn Mellott PA-C 10/04/2022 8:21 AM

## 2022-10-08 DEATH — deceased
